# Patient Record
Sex: Male | Born: 1970
Health system: Southern US, Community
[De-identification: ages and names within clinical notes are randomized; demographics above are authoritative.]

## PROBLEM LIST (undated history)

## (undated) DIAGNOSIS — J849 Interstitial pulmonary disease, unspecified: Secondary | ICD-10-CM

## (undated) DIAGNOSIS — D6861 Antiphospholipid syndrome: Secondary | ICD-10-CM

## (undated) DIAGNOSIS — I10 Essential (primary) hypertension: Secondary | ICD-10-CM

## (undated) DIAGNOSIS — F32A Depression, unspecified: Secondary | ICD-10-CM

## (undated) DIAGNOSIS — M8430XA Stress fracture, unspecified site, initial encounter for fracture: Secondary | ICD-10-CM

## (undated) DIAGNOSIS — I82409 Acute embolism and thrombosis of unspecified deep veins of unspecified lower extremity: Secondary | ICD-10-CM

## (undated) DIAGNOSIS — K224 Dyskinesia of esophagus: Secondary | ICD-10-CM

## (undated) DIAGNOSIS — E785 Hyperlipidemia, unspecified: Secondary | ICD-10-CM

## (undated) DIAGNOSIS — K635 Polyp of colon: Secondary | ICD-10-CM

## (undated) DIAGNOSIS — I2699 Other pulmonary embolism without acute cor pulmonale: Secondary | ICD-10-CM

## (undated) DIAGNOSIS — IMO0002 Reserved for concepts with insufficient information to code with codable children: Secondary | ICD-10-CM

## (undated) DIAGNOSIS — J189 Pneumonia, unspecified organism: Secondary | ICD-10-CM

## (undated) DIAGNOSIS — M1990 Unspecified osteoarthritis, unspecified site: Secondary | ICD-10-CM

## (undated) DIAGNOSIS — F419 Anxiety disorder, unspecified: Secondary | ICD-10-CM

## (undated) DIAGNOSIS — I639 Cerebral infarction, unspecified: Secondary | ICD-10-CM

## (undated) DIAGNOSIS — F329 Major depressive disorder, single episode, unspecified: Secondary | ICD-10-CM

## (undated) DIAGNOSIS — E7800 Pure hypercholesterolemia, unspecified: Secondary | ICD-10-CM

## (undated) DIAGNOSIS — M329 Systemic lupus erythematosus, unspecified: Secondary | ICD-10-CM

## (undated) HISTORY — DX: Anxiety disorder, unspecified: F41.9

## (undated) HISTORY — DX: Polyp of colon: K63.5

## (undated) HISTORY — DX: Antiphospholipid syndrome: D68.61

## (undated) HISTORY — DX: Hyperlipidemia, unspecified: E78.5

## (undated) HISTORY — DX: Unspecified osteoarthritis, unspecified site: M19.90

## (undated) HISTORY — DX: Essential (primary) hypertension: I10

## (undated) HISTORY — DX: Dyskinesia of esophagus: K22.4

## (undated) HISTORY — DX: Stress fracture, unspecified site, initial encounter for fracture: M84.30XA

## (undated) HISTORY — PX: OTHER SURGICAL HISTORY: SHX169

## (undated) HISTORY — PX: CHOLECYSTECTOMY: SHX55

## (undated) HISTORY — DX: Cerebral infarction, unspecified: I63.9

---
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
## 2014-07-12 ENCOUNTER — Encounter (HOSPITAL_COMMUNITY): Payer: Self-pay | Admitting: Emergency Medicine

## 2014-07-12 ENCOUNTER — Emergency Department (HOSPITAL_COMMUNITY)
Admission: EM | Admit: 2014-07-12 | Discharge: 2014-07-13 | Disposition: A | Discharge status: Home / Self Care | Payer: Managed Care, Other (non HMO) | Attending: Emergency Medicine | Admitting: Emergency Medicine

## 2014-07-12 DIAGNOSIS — R079 Chest pain, unspecified: Secondary | ICD-10-CM | POA: Diagnosis present

## 2014-07-12 DIAGNOSIS — Z7982 Long term (current) use of aspirin: Secondary | ICD-10-CM | POA: Diagnosis not present

## 2014-07-12 DIAGNOSIS — Z760 Encounter for issue of repeat prescription: Secondary | ICD-10-CM

## 2014-07-12 DIAGNOSIS — Z79899 Other long term (current) drug therapy: Secondary | ICD-10-CM | POA: Diagnosis not present

## 2014-07-12 DIAGNOSIS — K224 Dyskinesia of esophagus: Secondary | ICD-10-CM

## 2014-07-12 DIAGNOSIS — Z87891 Personal history of nicotine dependence: Secondary | ICD-10-CM | POA: Insufficient documentation

## 2014-07-12 DIAGNOSIS — K219 Gastro-esophageal reflux disease without esophagitis: Secondary | ICD-10-CM

## 2014-07-12 LAB — CBC
HCT: 47.7 % (ref 39.0–52.0)
Hemoglobin: 16.8 g/dL (ref 13.0–17.0)
MCH: 31.5 pg (ref 26.0–34.0)
MCHC: 35.2 g/dL (ref 30.0–36.0)
MCV: 89.3 fL (ref 78.0–100.0)
Platelets: 286 10*3/uL (ref 150–400)
RBC: 5.34 MIL/uL (ref 4.22–5.81)
RDW: 12.7 % (ref 11.5–15.5)
WBC: 7.2 10*3/uL (ref 4.0–10.5)

## 2014-07-12 LAB — BASIC METABOLIC PANEL
Anion gap: 16 — ABNORMAL HIGH (ref 5–15)
BUN: 16 mg/dL (ref 6–23)
CO2: 24 mEq/L (ref 19–32)
Calcium: 9.7 mg/dL (ref 8.4–10.5)
Chloride: 97 mEq/L (ref 96–112)
Creatinine, Ser: 1.21 mg/dL (ref 0.50–1.35)
GFR calc Af Amer: 84 mL/min — ABNORMAL LOW (ref >90)
GFR calc non Af Amer: 72 mL/min — ABNORMAL LOW (ref >90)
Glucose, Bld: 140 mg/dL — ABNORMAL HIGH (ref 70–99)
Potassium: 4 mEq/L (ref 3.7–5.3)
Sodium: 137 mEq/L (ref 137–147)

## 2014-07-12 LAB — I-STAT TROPONIN, ED: Troponin i, poc: 0 ng/mL (ref 0.00–0.08)

## 2014-07-12 MED ORDER — CLONAZEPAM 0.5 MG PO TABS
0.5000 mg | ORAL_TABLET | Freq: Once | ORAL | Status: AC
  Administered 2014-07-13: 0.5 mg via ORAL
  Filled 2014-07-12: qty 1

## 2014-07-12 NOTE — ED Notes (Signed)
Pt states he has history of esophageal spasms.  Pt states that last nite he has been getting chest pain since and states that it mimics heart pain.

## 2014-07-12 NOTE — ED Provider Notes (Signed)
CSN: [id number]     Arrival date & time 07/12/14  1712 History   First MD Initiated Contact with Patient 07/12/14 2151     Chief Complaint  Patient presents with  . Chest Pain     (Consider location/radiation/quality/duration/timing/severity/associated sxs/prior Treatment) The history is provided by the patient. No language interpreter was used.  Wesley Hancock is a 43 y/o M with PMHx of esophageal spasms presenting to the ED with chest discomfort that is localized to the center of his chest that started yesterday. Patient reported that he has history of "acalasia" and stated that he has been on Cardizem for it in the past. Stated that he has not taken his Cardizem in at least 2.5 weeks. Reported that he started to have increase in pressure and gnawing pain, burning pain that is constant. Reported that the pain does not change with activity level or exertion. Patient reported that when he gets increased acid reflux discomfort this is when he is having a flare up of his esophageal spasms. Patient reported that he recently moved from [place] 3 months ago and stated that he has not gotten his insurance until today. Patient reported that he went to [facility], but reported that they would not prescribe him the medication secondary to issues with having chest pain and needed to be ruled out. Patient reported that back in [place] he had work-up for cardiac rule out - stated that finally GI became involved and diagnosed the patient. Denied leg swelling, vomiting, diarrhea, fever, chills, neck pain, neck stiffness, jaw pain, diaphoresis, weakness, numbness, tingling.  PCP none   History reviewed. No pertinent past medical history. Past Surgical History  Procedure Laterality Date  . Esophageal spasms     No family history on file. History  Substance Use Topics  . Smoking status: Former Research scientist (life sciences)  . Smokeless tobacco: Not on file  . Alcohol Use: Yes     Comment: occ    Review of  Systems  Constitutional: Negative for fever and chills.  Respiratory: Positive for chest tightness. Negative for shortness of breath.   Cardiovascular: Negative for chest pain.  Gastrointestinal: Negative for nausea, vomiting, abdominal pain, constipation, blood in stool and anal bleeding.  Musculoskeletal: Negative for back pain, neck pain and neck stiffness.  Neurological: Negative for dizziness, weakness and numbness.      Allergies  Review of patient's allergies indicates no known allergies.  Home Medications   Prior to Admission medications   Medication Sig Start Date End Date Taking? Authorizing Provider  aspirin EC 81 MG tablet Take 81 mg by mouth daily.   Yes Historical Provider, MD  diltiazem (CARDIZEM) 30 MG tablet Take 30 mg by mouth daily.   Yes Historical Provider, MD  escitalopram (LEXAPRO) 5 MG tablet Take 5 mg by mouth daily.   Yes Historical Provider, MD  lisinopril-hydrochlorothiazide (PRINZIDE,ZESTORETIC) 10-12.5 MG per tablet Take 1 tablet by mouth daily.   Yes Historical Provider, MD  nitroGLYCERIN (NITROSTAT) 0.4 MG SL tablet Place 0.4 mg under the tongue every 5 (five) minutes as needed for chest pain.   Yes Historical Provider, MD  ranitidine (ZANTAC) 150 MG tablet Take 150 mg by mouth 2 (two) times daily.   Yes Historical Provider, MD  diltiazem (CARDIZEM) 30 MG tablet Take 1 tablet (30 mg total) by mouth 2 (two) times daily. 07/13/14   Harles Evetts, PA-C   BP 140/82  Pulse 82  Temp(Src) 98 F (36.7 C) (Oral)  Resp 15  SpO2 99% Physical  Exam  Nursing note and vitals reviewed. Constitutional: He is oriented to person, place, and time. He appears well-developed and well-nourished. No distress.  Patient found sitting comfortably in bed without any sign of distress  HENT:  Head: Normocephalic and atraumatic.  Mouth/Throat: Oropharynx is clear and moist. No oropharyngeal exudate.  Eyes: Conjunctivae and EOM are normal. Pupils are equal, round, and reactive  to light. Right eye exhibits no discharge. Left eye exhibits no discharge.  Neck: Normal range of motion. Neck supple. No tracheal deviation present.  Cardiovascular: Normal rate, regular rhythm and normal heart sounds.  Exam reveals no friction rub.   No murmur heard. Cap refill < 3 seconds Negative swelling or pitting edema noted to the lower extremities bilaterally   Pulmonary/Chest: Effort normal and breath sounds normal. No respiratory distress. He has no wheezes. He has no rales. He exhibits tenderness.  Patient is able to speak in full sentences without difficulty  Negative use of accessory muscles Negative stridor Negative damage noted to chest wall Mild discomfort upon palpation to the Center the chest as well as left-sided chest upon palpation-pain reproducible upon palpation  Abdominal: Soft. Bowel sounds are normal. There is no tenderness. There is no rebound and no guarding.  Musculoskeletal: Normal range of motion.  Full ROM to upper and lower extremities without difficulty noted, negative ataxia noted.  Lymphadenopathy:    He has no cervical adenopathy.  Neurological: He is alert and oriented to person, place, and time. No cranial nerve deficit. He exhibits normal muscle tone. Coordination normal.  Cranial nerves III-XII grossly intact Strength 5+/5+ to upper and lower extremities bilaterally with resistance applied, equal distribution noted Equal grip strength  Negative usual droop Negative slurred speech Negative aphasia Gait proper, proper balance - negative sway, negative drift, negative step-offs  Skin: Skin is warm and dry. No rash noted. He is not diaphoretic. No erythema.  Psychiatric: He has a normal mood and affect. His behavior is normal.    ED Course  Procedures (including critical care time)  12:12 AM Patient seen and assessed by attending physician who saw and assessed patient. As per physician, reported that patient can be discharged. Recommended  discharging patient with Cardizem.   Results for orders placed during the hospital encounter of 07/12/14  CBC      Result Value Ref Range   WBC 7.2  4.0 - 10.5 K/uL   RBC 5.34  4.22 - 5.81 MIL/uL   Hemoglobin 16.8  13.0 - 17.0 g/dL   HCT 47.7  39.0 - 52.0 %   MCV 89.3  78.0 - 100.0 fL   MCH 31.5  26.0 - 34.0 pg   MCHC 35.2  30.0 - 36.0 g/dL   RDW 12.7  11.5 - 15.5 %   Platelets 286  150 - 400 K/uL  BASIC METABOLIC PANEL      Result Value Ref Range   Sodium 137  137 - 147 mEq/L   Potassium 4.0  3.7 - 5.3 mEq/L   Chloride 97  96 - 112 mEq/L   CO2 24  19 - 32 mEq/L   Glucose, Bld 140 (*) 70 - 99 mg/dL   BUN 16  6 - 23 mg/dL   Creatinine, Ser 1.21  0.50 - 1.35 mg/dL   Calcium 9.7  8.4 - 10.5 mg/dL   GFR calc non Af Amer 72 (*) >90 mL/min   GFR calc Af Amer 84 (*) >90 mL/min   Anion gap 16 (*) 5 - 15  Randolm Idol, ED      Result Value Ref Range   Troponin i, poc 0.00  0.00 - 0.08 ng/mL   Comment 3             Labs Review Labs Reviewed  BASIC METABOLIC PANEL - Abnormal; Notable for the following:    Glucose, Bld 140 (*)    GFR calc non Af Amer 72 (*)    GFR calc Af Amer 84 (*)    Anion gap 16 (*)    All other components within normal limits  CBC  I-STAT TROPOININ, ED    Imaging Review No results found.   EKG Interpretation None      Date: 07/13/2014  Rate: 108  Rhythm: sinus tachycardia  QRS Axis: normal  Intervals: normal  ST/T Wave abnormalities: normal  Conduction Disutrbances:none  Narrative Interpretation:   Old EKG Reviewed: none available EKG analyzed and reviewed by this provider and attending physician.    MDM   Final diagnoses:  Esophageal spasm  Gastroesophageal reflux disease, esophagitis presence not specified  Medication refill    Medications  clonazePAM (KLONOPIN) tablet 0.5 mg (not administered)   Filed Vitals:   07/12/14 2200 07/12/14 2230 07/12/14 2300 07/12/14 2330  BP: 135/87 129/81 127/89 140/82  Pulse: 63 68 62 82   Temp:      TempSrc:      Resp: 24 25 25 15   SpO2: 97% 96% 95% 99%   Patient presenting to the ED requesting Cardizem prescription refill regarding his esophageal spasms. Patient recently moved from [place] and acquired insurance yesterday. Has been without Cardizem for approximately 2-1/2 weeks. EKG normal sinus tachycardia with a heart rate of 108 - normal EKG. I-STAT troponin negative elevation. CBC and BMP unremarkable. Pain reproducible upon exam. Patient stable, afebrile. Patient not septic appearing. Heart rate is improved while in ED setting from 112 beats per minute 82 beats per minute. Patient seen and assessed by attending physician who agreed to plan of discharge-recommended patient be discharged with Cardizem. Discharge patient with Cardizem. [facility]. Discussed with patient to closely monitor symptoms and if symptoms are to worsen or change to report back to the ED - strict return instructions given.  Patient agreed to plan of care, understood, all questions answered.   Jamse Mead, PA-C 07/13/14 [id number]

## 2014-07-13 MED ORDER — DILTIAZEM HCL 30 MG PO TABS: 30.0000 mg | ORAL_TABLET | Freq: Two times a day (BID) | ORAL | Status: DC

## 2014-07-13 NOTE — Discharge Instructions (Signed)
Please call and set-up an appointment with [facility] [place] Please get established with a Gastroenterologist Please take medications as prescribed Please continue to rest and stay hydrated  Please continue to monitor symptoms closely and if symptoms are to worsen or change (fever greater than 101, chills, sweating, nausea, vomiting, chest pain, shortness of breath, difficulty breathing, weakness, numbness, tingling, worsening or changes to pain pattern, left arm weakness, jaw pain) please report back to the ED immediately   Esophageal Spasm Esophageal spasm is an uncoordinated contraction of the muscles of the esophagus (the tube which carries food from your mouth to your stomach). Normally, the muscles of the esophagus alternate between contraction and relaxation starting from the top of the esophagus and working down to the bottom. This moves the food from the mouth to the stomach. In esophageal spasm, all the muscles contract at once. This causes pain and fails to move the food along. As a result, you may have trouble swallowing.  Women are more likely than men to have esophageal spasm. The cause of the spasms is not known. Sometimes eating hot or cold foods triggers the condition and this may be due to an overly sensitive esophagus. This is not an infectious disease and cannot be passed to others. SYMPTOMS  Symptoms of esophageal spasm may include: chest pain, burning or pain with swallowing, and difficulty swallowing.  DIAGNOSIS  Esophageal spasm can be diagnosed by a test called manometry (pressure studies of the esophagus). In this test, a special tube is inserted down the esophagus. The tube measures the muscle activity of the esophagus. Abnormal contractions mixed with normal movement helps confirm the diagnosis.  A person with a hypersensitive esophagus may be diagnosed by inflating a long balloon in the person's esophagus. If this causes the same symptoms, preventive methods may  work. PREVENTION  Avoid hot or cold foods if that seems to be a trigger. PROGNOSIS  This condition does not go away, nor is treatment entirely satisfactory. Patients need to be careful of what they eat. They need to continue on medication if a useful one is found. Fortunately, the condition does not get progressively worse as time passes. Esophageal spasm does not usually lead to more serious problems but sometimes the pain can be disabling. If a person becomes afraid to eat they may become malnourished and lose weight.  TREATMENT   A procedure in which instruments of increasing size are inserted through the esophagus to enlarge (dilate) it are used.  Medications that decrease acid-production of the stomach may be used such as proton-pump inhibitors or H2-blockers.  Medications of several types can be used to relax the muscles of the esophagus.  An individual with a hypersensitive esophagus sometimes improves with low doses of medications normally used for depression.  No treatment for esophageal spasm is effective for everyone. Often several approaches will be tried before one works. In many cases, the symptoms will improve, but will not go away completely.  For severe cases, relief is obtained two-thirds of the time by cutting the muscles along the entire length of the esophagus. This is a major surgical procedure.  Your symptoms are usually the best guide to how well the treatment for esophageal spasm works. SIDE EFFECTS OF TREATMENTS  Nitrates can cause headaches and low blood pressure.  Calcium channel blockers can cause:  Feeling sick to your stomach (nausea).  Constipation and other side effects.  Antidepressants can cause side effects that depend on the medication used. HOME CARE  INSTRUCTIONS   Let your caregiver know if problems are getting worse, or if you get food stuck in your esophagus for longer than 1 hour or as directed and are unable to swallow liquid.  Take  medications as directed and with permission of your caregiver. Ask about what to do if a medication seems to get stuck in your esophagus. Only take over-the-counter or prescription medicines for pain, discomfort, or fever as directed by your caregiver.  Soft and liquid foods pass more easily than solid pieces. SEEK IMMEDIATE MEDICAL CARE IF:   You develop severe chest pain, especially if the pain is crushing or pressure-like and spreads to the arms, back, neck, or jaw, or if you have sweating, nausea, or shortness of breath. THIS COULD BE AN EMERGENCY. Do not wait to see if the pain will go away. Get medical help at once. Call 911 or 0 (operator). DO NOT drive yourself to the hospital.  Your chest pain gets worse and does not go away with rest.  You have an attack of chest pain lasting longer than usual despite rest and treatment with the medications your physician has prescribed.  You wake from sleep with chest pain or shortness of breath.  You feel dizzy or faint.  You have chest pain, not typical of your usual pain, caused by your esophagus for which you originally saw your caregiver. MAKE SURE YOU:   Understand these instructions.  Will watch your condition.  Will get help right away if you are not doing well or get worse. Document Released: 02/06/2003 Document Revised: 02/08/2012 Document Reviewed: 02/09/2014 [facility] Patient Information 2015 [place], [place]. This information is not intended to replace advice given to you by your health care provider. Make sure you discuss any questions you have with your health care provider.   Emergency Department Resource Guide 1) Find a Doctor and Pay Out of Pocket Although you won't have to find out who is covered by your insurance plan, it is a good idea to ask around and get recommendations. You will then need to call the office and see if the doctor you have chosen will accept you as a new patient and what types of options they offer for  patients who are self-pay. Some doctors offer discounts or will set up payment plans for their patients who do not have insurance, but you will need to ask so you aren't surprised when you get to your appointment.  2) Contact Your Local Health Department Not all health departments have doctors that can see patients for sick visits, but many do, so it is worth a call to see if yours does. If you don't know where your local health department is, you can check in your phone book. The CDC also has a tool to help you locate your state's health department, and many state websites also have listings of all of their local health departments.  3) Find a [facility] If your illness is not likely to be very severe or complicated, you may want to try a walk in clinic. These are popping up all over the country in pharmacies, drugstores, and shopping centers. They're usually staffed by nurse practitioners or physician assistants that have been trained to treat common illnesses and complaints. They're usually fairly quick and inexpensive. However, if you have serious medical issues or chronic medical problems, these are probably not your best option.  No Primary Care Doctor: - Call Health Connect at  [phone] - they can help you locate  a primary care doctor that  accepts your insurance, provides certain services, etc. - Physician Referral Service- [phone]  Chronic Pain Problems: Organization         Address  Phone   Notes  [facility]  [phone] Patients need to be referred by their primary care doctor.   Medication Assistance: Organization         Address  Phone   Notes  [facility] Medication [facility] [address] [place], [place] [address] [phone] --Must be a resident of [facility] -- Must have NO insurance coverage whatsoever (no Medicaid/ Medicare, etc.) -- The pt. MUST have a primary care doctor that directs their care regularly  and follows them in the community   MedAssist  [phone]   [employer]  [phone]    Agencies that provide inexpensive medical care: Organization         Address  Phone   Notes  [place]  [phone]   [facility]    [phone]   [facility] [place], [place] [address] [phone]   [place] [address] [place] [phone]   Planned Parenthood    [phone]   [facility]    [phone]   [place] and [place] [address], [place] Phone:  [phone], Fax:  [phone] Hours of Operation:  9 am - 6 pm, M-F.  Also accepts Medicaid/Medicare and self-pay.  [facility] for Brielle [address], [address], [place] Phone: [phone], Fax: [phone]. Hours of Operation:  8:30 am - 5:30 pm, M-F.  Also accepts Medicaid and self-pay.  [facility] [address] [place] Phone: [phone]   [place], [place], [place] [phone], Ext. [phone] Mondays & Thursdays: 7-9 AM.  First 15 patients are seen on a first come, first serve basis.    [place] Providers:  Organization         Address  Phone   Notes  [facility] [address], [place] [phone] Also accepts self-pay patients.  [facility] [address], [place]  [phone]   [place], [address], [place] [phone]   [facility] [address] [place] [phone]   Lucianne Lei [address], [place]   [phone] Only accepts [place] Access [place] patients after they have their name applied to their card.   Self-Pay (no insurance) in [facility]:  Organization         Address  Phone   Notes  [place]ickle  Cell Patients, [facility] [place] [phone]   [facility] Urgent Care [place] [phone]   [facility] [place]  [place], [place], [place] [phone]   [facility]/Dr. Osei-Bonsu  [address] [place] or [address], [place] [phone] Phone number for both [place] and [place] locations is the same.  [facility] [address], [place] [phone]   [facility] [address] [place] or [address] [phone] (  [phone]   [place] [phone], phone; [phone], fax Sees patients 1st and 3rd Saturday of every month.  Must not qualify for public or private insurance (i.e. Medicaid, Medicare, [place] Health Choice, Veterans' Benefits)  Household income should be no more than 200% of the poverty level The clinic cannot treat you if you are pregnant or think you are pregnant  Sexually transmitted diseases are not treated at the clinic.    Dental Care: Organization         Address  Phone  Notes  [facility] Department of [facility] [phone] Accepts children up to age 29 who are enrolled in [place] or [place]; pregnant women with a Medicaid card; and children who have applied for Medicaid or [place] Health Choice, but were declined, whose parents can pay a reduced fee at time of service.  [facility] Department of [facility]  [address], [place] [phone] Accepts children up to age 57 who are enrolled in [place] or [place]; pregnant women with a Medicaid card; and children who have applied for Medicaid or [place] Health Choice, but were declined, whose parents can pay a reduced fee at time of service.  [facility]  Access PROGRAM  [place] [phone] Patients are seen by appointment only. Walk-ins are not accepted. [place] will see patients 5 years of age and older. Monday - Tuesday (8am-5pm) Most Wednesdays (8:30-5pm) $30 per visit, cash only  [facility] Adult Dental Access PROGRAM  [address], [facility] [phone] Patients are seen by appointment only. Walk-ins are not accepted. [place] will see patients 44 years of age and older. One Wednesday Evening (Monthly: Volunteer Based).  $30 per visit, cash only  [facility]  [phone] for adults; Children under age 8, call Graduate Pediatric Dentistry at [phone]. Children aged 69-14, please call [phone] to request a pediatric application.  Dental services are provided in all areas of dental care including fillings, crowns and bridges, complete and partial dentures, implants, gum treatment, root canals, and extractions. Preventive care is also provided. Treatment is provided to both adults and children. Patients are selected via a lottery and there is often a waiting list.   [facility] [address] [place]  [phone] [email or web address]   [facility] [address] [place], [place] [phone], Ext. [phone] Second and Fourth Thursday of each month, opens at 6:30 AM; Clinic ends at 9 AM.  Patients are seen on a first-come first-served basis, and a limited number are seen during each clinic.   [facility]  [address] Hillard Danker [place], [place] [phone]   Eligibility Requirements You must have lived in [place], [place], or [place] counties for at least the last three months.   You cannot be eligible for state or federal sponsored [employer], including [employer], [place], or [employer].   You generally cannot be eligible for healthcare insurance through your employer.    How to apply: Eligibility  screenings are held every Tuesday and Wednesday afternoon from 1:00 pm until 4:00 pm. You do not need an appointment for the interview!  [facility] [address], [place], [place]   [place]  [phone]   [place] Department  [phone]  [place] in the Community: Intensive Outpatient Programs Organization         Address  Phone  Notes  [place]elika [place]. [address] [place], [place] [phone]   [facility] Outpatient [address] [place], [place]   ADS: Alcohol & Drug Svcs [address] [place], [place]   [place] [address],  [place], [place] or [phone]   Substance Abuse Resources Organization         Address  Phone  Notes  Alcohol and Drug Services  [phone]   [place]  [phone]   The [place]   Chinita Pester  [phone]   Residential & Outpatient Substance Abuse Program  [phone]   Psychological Services Organization         Address  Phone  Notes  [facility] [place]  [place]  [phone]   [place] [address] [place] or [phone]    Mobile Crisis Teams Organization         Address  Phone  Notes  [facility], Mobile Crisis Care Unit  [phone]   [facility]ive Psychotherapeutic Services  [address] [place], [place]   [place] [address], [place] [phone]    Self-Help/Support Groups Organization         Address  Phone             Notes  [place]. of [place] - variety of support groups  [place] Call for more information  Narcotics Anonymous (NA), [facility] [address], [employer] [place]  2 meetings at  this location   Special educational needs teacher         Address  Phone  Notes  ASAP Residential Treatment [place],    [place]  [phone]   [facility]  [address] [place] [address], [place], [place]   [place], [place] [phone] Admissions: 8am-3pm M-F  Incentives Substance [place] [address]    [place], [place] [phone]   [facility] [address] [place], [place], [place]   [facility] [address]  [place], [place]   [facility]ight Programs - Intensive Outpatient [address], [place], [place]   [facility] [place].,  [place], [place] [phone] or [phone]   [facility] (RTS) [address], [place], [place] Accepts Medicaid  [place] [address]  [place] [phone] Substance Abuse/Addiction Treatment   [facility] Organization         Address  Phone  Notes  [facility]  [phone]   Domenic Schwab, PhD [address] Arlis Porta [place], [place]   [phone] or [phone]   [facility] [place], [place] [phone]   [place] [address] [place], [place] [phone] Insurance/Medicaid/sponsorship through [employer] and Families [address] [place]                                    [place], [place] [phone] Therapy/tele-psych/case  [facility] [place], [place] [phone]    Dr. Adele Schilder  [phone]   [facility] Dept. 1) [address], [place] 2) [place] 3)  [place] [address], [place] [phone]  [phone]   [place] [phone] or [phone] (After Hours)

## 2014-07-13 NOTE — ED Provider Notes (Signed)
Medical screening examination/treatment/procedure(s) were conducted as a shared visit with non-physician practitioner(s) and myself.  I personally evaluated the patient during the encounter. Pt presents w/ constant substernal CP since yesterday, similar to multiple prior episodes of esophageal spasm. He is out of his Cardizem. EKG w/o acute ischemic changes, trop negative. Will d/c home w/ Cardizem.   EKG Interpretation   Date/Time:  Thursday July 12 2014 17:18:59 EDT Ventricular Rate:  108 PR Interval:  144 QRS Duration: 86 QT Interval:  340 QTC Calculation: 455 R Axis:   18 Text Interpretation:  Sinus tachycardia Otherwise normal ECG No prior for  comparison. Confirmed by Tawnya Crook  MD, [place] [phone]) on 07/13/2014 8:24:36  PM        Ernestina Patches, MD 07/13/14 2027

## 2014-09-21 DIAGNOSIS — R911 Solitary pulmonary nodule: Secondary | ICD-10-CM | POA: Insufficient documentation

## 2014-09-21 DIAGNOSIS — K224 Dyskinesia of esophagus: Secondary | ICD-10-CM | POA: Insufficient documentation

## 2014-09-21 HISTORY — DX: Solitary pulmonary nodule: R91.1

## 2014-09-23 DIAGNOSIS — I10 Essential (primary) hypertension: Secondary | ICD-10-CM | POA: Insufficient documentation

## 2014-09-23 DIAGNOSIS — I119 Hypertensive heart disease without heart failure: Secondary | ICD-10-CM | POA: Insufficient documentation

## 2014-09-23 HISTORY — DX: Essential (primary) hypertension: I10

## 2015-02-23 ENCOUNTER — Emergency Department (HOSPITAL_COMMUNITY)
Admission: EM | Admit: 2015-02-23 | Discharge: 2015-02-23 | Disposition: A | Discharge status: Home / Self Care | Payer: Managed Care, Other (non HMO) | Attending: Emergency Medicine | Admitting: Emergency Medicine

## 2015-02-23 ENCOUNTER — Encounter (HOSPITAL_COMMUNITY): Payer: Self-pay | Admitting: Emergency Medicine

## 2015-02-23 ENCOUNTER — Emergency Department (HOSPITAL_COMMUNITY): Payer: Managed Care, Other (non HMO)

## 2015-02-23 DIAGNOSIS — Z79899 Other long term (current) drug therapy: Secondary | ICD-10-CM | POA: Diagnosis not present

## 2015-02-23 DIAGNOSIS — Z7982 Long term (current) use of aspirin: Secondary | ICD-10-CM | POA: Diagnosis not present

## 2015-02-23 DIAGNOSIS — R0789 Other chest pain: Secondary | ICD-10-CM | POA: Diagnosis not present

## 2015-02-23 DIAGNOSIS — R079 Chest pain, unspecified: Secondary | ICD-10-CM | POA: Diagnosis present

## 2015-02-23 DIAGNOSIS — Z87891 Personal history of nicotine dependence: Secondary | ICD-10-CM | POA: Insufficient documentation

## 2015-02-23 LAB — CBC
HCT: 46.3 % (ref 39.0–52.0)
Hemoglobin: 16.1 g/dL (ref 13.0–17.0)
MCH: 31.2 pg (ref 26.0–34.0)
MCHC: 34.8 g/dL (ref 30.0–36.0)
MCV: 89.7 fL (ref 78.0–100.0)
Platelets: 263 10*3/uL (ref 150–400)
RBC: 5.16 MIL/uL (ref 4.22–5.81)
RDW: 12.3 % (ref 11.5–15.5)
WBC: 6.8 10*3/uL (ref 4.0–10.5)

## 2015-02-23 LAB — BASIC METABOLIC PANEL
Anion gap: 11 (ref 5–15)
BUN: 16 mg/dL (ref 6–23)
CO2: 26 mmol/L (ref 19–32)
Calcium: 9.6 mg/dL (ref 8.4–10.5)
Chloride: 102 mmol/L (ref 96–112)
Creatinine, Ser: 1.09 mg/dL (ref 0.50–1.35)
GFR calc Af Amer: >90 mL/min (ref >90)
GFR calc non Af Amer: 82 mL/min — ABNORMAL LOW (ref >90)
Glucose, Bld: 101 mg/dL — ABNORMAL HIGH (ref 70–99)
Potassium: 3.9 mmol/L (ref 3.5–5.1)
Sodium: 139 mmol/L (ref 135–145)

## 2015-02-23 LAB — I-STAT TROPONIN, ED
Troponin i, poc: 0 ng/mL (ref 0.00–0.08)
Troponin i, poc: 0.01 ng/mL (ref 0.00–0.08)

## 2015-02-23 MED ORDER — SUCRALFATE 1 GM/10ML PO SUSP: 1.0000 g | Freq: Three times a day (TID) | ORAL | Status: DC

## 2015-02-23 NOTE — Discharge Instructions (Signed)
Follow-up with the GI Dr. provided.  Return here as needed

## 2015-02-23 NOTE — ED Notes (Signed)
Pt alert, oriented, and ambulatory  upon DC.  He was advised to follow up with PCP and GI.

## 2015-02-23 NOTE — ED Notes (Signed)
Patient here with complaints of chest pain that start last night. States that he know it is not cardiac related, its related to his esophageal spasms. States that he took 2 nitro tablets that did nothing for his pain. Denis n/v/d. AAO x4

## 2015-02-23 NOTE — ED Provider Notes (Signed)
CSN: [id number]     Arrival date & time 02/23/15  0741 History   First MD Initiated Contact with Patient 02/23/15 0801     Chief Complaint  Patient presents with  . Chest Pain     (Consider location/radiation/quality/duration/timing/severity/associated sxs/prior Treatment) HPI   The patient is a 44 y/o male with a history of achalasia, hypertension, former smoking history, and family history of heart disease and diabetes who presents to the emergency department with left sided chest pain. This pain started 2 days ago and became much worse last night. It is constant but worsens for several minutes at a time. He states that this pain feels much like his usual achalasia "flair ups" except that it is more severe and lower than normal. The pain is in his mid left chest down to his lower ribs and over to his midaxilla, and occasionally to his left shoulder. 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 He has had worsening dysphagia and nausea, no regurgitation, reflux, or vomiting. He took 2 nitrostat tabs last night without relief, and these usually relieve the spasms he feels with achalasia. He denies fevers, chills, cough, palpitations, weakness, numbness, tingling, edema, diaphoresis. He endorses shortness of breath when the pain is at its worst. He has hypertension and achalasia. His achalasia was diagnoses 2.5 years ago after extensive workup including several cardiac rule-outs, but he says he has had the symptoms for over a decade. He typically has a mild flair up at least weekly. He denies a personal history of diabetes. He has a family history of heart disease, his father had his first MI at age 71, CABG in his 5s, and is now in his 109s with severe CHF. He also has a family history of diabetes in his mother.    History reviewed. No pertinent past medical history. Past Surgical History  Procedure Laterality Date  . Esophageal spasms     History reviewed. No pertinent  family history. History  Substance Use Topics  . Smoking status: Former Research scientist (life sciences)  . Smokeless tobacco: Not on file  . Alcohol Use: Yes     Comment: occ    Review of Systems  All other systems negative except as documented in the HPI. All pertinent positives and negatives as reviewed in the HPI.   Allergies  Review of patient's allergies indicates no known allergies.  Home Medications   Prior to Admission medications   Medication Sig Start Date End Date Taking? Authorizing Provider  aspirin EC 81 MG tablet Take 81 mg by mouth daily.   Yes Historical Provider, MD  diltiazem (CARDIZEM) 30 MG tablet Take 1 tablet (30 mg total) by mouth 2 (two) times daily. 07/13/14  Yes Marissa Sciacca, PA-C  lisinopril-hydrochlorothiazide (PRINZIDE,ZESTORETIC) 10-12.5 MG per tablet Take 1 tablet by mouth daily.   Yes Historical Provider, MD  nitroGLYCERIN (NITROSTAT) 0.4 MG SL tablet Place 0.4 mg under the tongue every 5 (five) minutes as needed for chest pain.   Yes Historical Provider, MD  ranitidine (ZANTAC) 150 MG tablet Take 150 mg by mouth 2 (two) times daily.   Yes Historical Provider, MD   BP 126/91 mmHg  Pulse 79  Temp(Src) 98.4 F (36.9 C) (Oral)  Resp 12  SpO2 99% Physical Exam  Constitutional: He is oriented to person, place, and time. He appears well-developed and well-nourished. No distress.  HENT:  Head: Normocephalic and atraumatic.  Eyes: Conjunctivae and EOM are normal. Pupils are equal, round, and reactive to light.  No scleral icterus.  Neck: Normal range of motion. No tracheal deviation present. No thyromegaly present.  Cardiovascular: Normal rate, regular rhythm, normal heart sounds and intact distal pulses.  Exam reveals no gallop and no friction rub.   No murmur heard. Pulmonary/Chest: Effort normal and breath sounds normal. No respiratory distress. He exhibits no tenderness.  Abdominal: Soft. He exhibits no distension. There is no tenderness.  Musculoskeletal: Normal range  of motion.  Lymphadenopathy:    He has no cervical adenopathy.  Neurological: He is alert and oriented to person, place, and time. No cranial nerve deficit. He exhibits normal muscle tone.  Skin: Skin is warm and dry. No rash noted. He is not diaphoretic. No erythema.  Psychiatric: He has a normal mood and affect.  Nursing note and vitals reviewed.   ED Course  Procedures (including critical care time) Labs Review Labs Reviewed  BASIC METABOLIC PANEL - Abnormal; Notable for the following:    Glucose, Bld 101 (*)    GFR calc non Af Amer 82 (*)    All other components within normal limits  CBC  I-STAT TROPOININ, ED  I-STAT TROPOININ, ED    Imaging Review No results found.   EKG Interpretation   Date/Time:  Saturday February 23 2015 08:00:20 EDT Ventricular Rate:  74 PR Interval:  153 QRS Duration: 95 QT Interval:  386 QTC Calculation: 428 R Axis:   15 Text Interpretation:  Sinus rhythm Baseline wander in lead(s) V4 V5 V6 No  significant change was found Confirmed by Wyvonnia Dusky  MD, STEPHEN [phone]) on  02/23/2015 8:19:40 AM      Patient is stable at this time.  The patient will follow-up with GI.  I feel like this is related to his achalasia.  The patient is advised to return here as needed.  The patient has had 2 sets of negative troponins.  The patient's chest pain is atypical, based on the fact that it is fairly constant and worse with eating    Dalia Heading, PA-C 02/23/15 [place], MD 02/24/15 [phone]

## 2015-09-18 DIAGNOSIS — E782 Mixed hyperlipidemia: Secondary | ICD-10-CM

## 2015-09-18 DIAGNOSIS — E785 Hyperlipidemia, unspecified: Secondary | ICD-10-CM | POA: Insufficient documentation

## 2015-09-18 HISTORY — DX: Mixed hyperlipidemia: E78.2

## 2016-02-08 ENCOUNTER — Emergency Department (HOSPITAL_COMMUNITY): Payer: Managed Care, Other (non HMO)

## 2016-02-08 ENCOUNTER — Emergency Department (HOSPITAL_COMMUNITY)
Admission: EM | Admit: 2016-02-08 | Discharge: 2016-02-08 | Disposition: A | Discharge status: Home / Self Care | Payer: Managed Care, Other (non HMO) | Attending: Emergency Medicine | Admitting: Emergency Medicine

## 2016-02-08 ENCOUNTER — Encounter (HOSPITAL_COMMUNITY): Payer: Self-pay

## 2016-02-08 DIAGNOSIS — Z87891 Personal history of nicotine dependence: Secondary | ICD-10-CM | POA: Insufficient documentation

## 2016-02-08 DIAGNOSIS — Z7982 Long term (current) use of aspirin: Secondary | ICD-10-CM | POA: Diagnosis not present

## 2016-02-08 DIAGNOSIS — Z79899 Other long term (current) drug therapy: Secondary | ICD-10-CM | POA: Diagnosis not present

## 2016-02-08 DIAGNOSIS — F329 Major depressive disorder, single episode, unspecified: Secondary | ICD-10-CM | POA: Diagnosis not present

## 2016-02-08 DIAGNOSIS — E7800 Pure hypercholesterolemia, unspecified: Secondary | ICD-10-CM | POA: Diagnosis not present

## 2016-02-08 DIAGNOSIS — I10 Essential (primary) hypertension: Secondary | ICD-10-CM | POA: Diagnosis not present

## 2016-02-08 DIAGNOSIS — K224 Dyskinesia of esophagus: Secondary | ICD-10-CM | POA: Diagnosis not present

## 2016-02-08 DIAGNOSIS — R079 Chest pain, unspecified: Secondary | ICD-10-CM | POA: Diagnosis present

## 2016-02-08 HISTORY — DX: Pure hypercholesterolemia, unspecified: E78.00

## 2016-02-08 HISTORY — DX: Major depressive disorder, single episode, unspecified: F32.9

## 2016-02-08 HISTORY — DX: Depression, unspecified: F32.A

## 2016-02-08 LAB — BASIC METABOLIC PANEL
Anion gap: 8 (ref 5–15)
BUN: 19 mg/dL (ref 6–20)
CO2: 25 mmol/L (ref 22–32)
Calcium: 9.8 mg/dL (ref 8.9–10.3)
Chloride: 104 mmol/L (ref 101–111)
Creatinine, Ser: 1.05 mg/dL (ref 0.61–1.24)
GFR calc Af Amer: >60 mL/min (ref >60)
GFR calc non Af Amer: >60 mL/min (ref >60)
Glucose, Bld: 129 mg/dL — ABNORMAL HIGH (ref 65–99)
Potassium: 3.7 mmol/L (ref 3.5–5.1)
Sodium: 137 mmol/L (ref 135–145)

## 2016-02-08 LAB — CBC
HCT: 46.1 % (ref 39.0–52.0)
Hemoglobin: 15.6 g/dL (ref 13.0–17.0)
MCH: 31.3 pg (ref 26.0–34.0)
MCHC: 33.8 g/dL (ref 30.0–36.0)
MCV: 92.4 fL (ref 78.0–100.0)
Platelets: 266 10*3/uL (ref 150–400)
RBC: 4.99 MIL/uL (ref 4.22–5.81)
RDW: 12.4 % (ref 11.5–15.5)
WBC: 6.7 10*3/uL (ref 4.0–10.5)

## 2016-02-08 LAB — I-STAT TROPONIN, ED: Troponin i, poc: 0 ng/mL (ref 0.00–0.08)

## 2016-02-08 MED ORDER — GI COCKTAIL ~~LOC~~
30.0000 mL | Freq: Once | ORAL | Status: AC
  Administered 2016-02-08: 30 mL via ORAL
  Filled 2016-02-08: qty 30

## 2016-02-08 MED ORDER — DIAZEPAM 5 MG PO TABS
5.0000 mg | ORAL_TABLET | Freq: Once | ORAL | Status: AC
  Administered 2016-02-08: 5 mg via ORAL
  Filled 2016-02-08: qty 1

## 2016-02-08 MED ORDER — DIAZEPAM 5 MG PO TABS: 5.0000 mg | ORAL_TABLET | Freq: Four times a day (QID) | ORAL | Status: DC | PRN

## 2016-02-08 NOTE — ED Provider Notes (Signed)
CSN: [id number]     Arrival date & time 02/08/16  0027 History   First MD Initiated Contact with Patient 02/08/16 0450     Chief Complaint  Patient presents with  . Chest Pain     (Consider location/radiation/quality/duration/timing/severity/associated sxs/prior Treatment) Patient is a 45 y.o. male presenting with chest pain. The history is provided by the patient. No language interpreter was used.  Chest Pain Pain location:  L chest Pain quality: aching   Pain radiates to:  Does not radiate Pain radiates to the back: no   Pain severity:  Severe Onset quality:  Gradual Duration:  1 week Timing:  Constant Progression:  Worsening Chronicity:  New Relieved by:  Nothing Worsened by:  Nothing tried Ineffective treatments:  Antacids and nitroglycerin Risk factors: hypertension   Risk factors: no coronary artery disease   Pt has a history of esophageal spasm.  Pt is followed at [facility] reports he has had a normal cath within 3 years. Pt is out of valium.  Pt reports he takes nitroglycerin.  He has failed imdur and cardizem.   Past Medical History  Diagnosis Date  . HTN (hypertension)   . High cholesterol   . Depression    Past Surgical History  Procedure Laterality Date  . Esophageal spasms     History reviewed. No pertinent family history. Social History  Substance Use Topics  . Smoking status: Former Research scientist (life sciences)  . Smokeless tobacco: None  . Alcohol Use: Yes     Comment: occ    Review of Systems  Cardiovascular: Positive for chest pain.  All other systems reviewed and are negative.     Allergies  Review of patient's allergies indicates no known allergies.  Home Medications   Prior to Admission medications   Medication Sig Start Date End Date Taking? Authorizing Provider  acetaminophen (TYLENOL) 500 MG tablet Take 1,000 mg by mouth every 6 (six) hours as needed for mild pain.   Yes Historical Provider, MD  aspirin EC 81 MG tablet Take 81 mg by mouth daily.    Yes Historical Provider, MD  atorvastatin (LIPITOR) 10 MG tablet Take 10 mg by mouth daily.   Yes Historical Provider, MD  escitalopram (LEXAPRO) 5 MG tablet Take 5 mg by mouth daily.   Yes Historical Provider, MD  HYDROcodone-acetaminophen (NORCO/VICODIN) 5-325 MG tablet Take 1 tablet by mouth every 6 (six) hours as needed for moderate pain.   Yes Historical Provider, MD  lisinopril-hydrochlorothiazide (PRINZIDE,ZESTORETIC) 10-12.5 MG per tablet Take 1 tablet by mouth daily.   Yes Historical Provider, MD  nitroGLYCERIN (NITROSTAT) 0.4 MG SL tablet Place 0.4 mg under the tongue every 5 (five) minutes as needed for chest pain.   Yes Historical Provider, MD  omeprazole (PRILOSEC) 20 MG capsule Take 20 mg by mouth 2 (two) times daily before a meal.   Yes Historical Provider, MD  diltiazem (CARDIZEM) 30 MG tablet Take 1 tablet (30 mg total) by mouth 2 (two) times daily. Patient not taking: Reported on 02/08/2016 07/13/14   Marissa Sciacca, PA-C  sucralfate (CARAFATE) 1 GM/10ML suspension Take 10 mLs (1 g total) by mouth 4 (four) times daily -  with meals and at bedtime. Patient not taking: Reported on 02/08/2016 02/23/15   Dalia Heading, PA-C   BP 135/98 mmHg  Pulse 93  Temp(Src) 98.4 F (36.9 C) (Oral)  Resp 20  SpO2 98% Physical Exam  Constitutional: He is oriented to person, place, and time. He appears well-developed and well-nourished.  HENT:  Head: Normocephalic.  Eyes: EOM are normal.  Neck: Normal range of motion.  Pulmonary/Chest: Effort normal.  Abdominal: He exhibits no distension.  Musculoskeletal: Normal range of motion.  Neurological: He is alert and oriented to person, place, and time.  Psychiatric: He has a normal mood and affect.  Nursing note and vitals reviewed.   ED Course  Procedures (including critical care time) Labs Review Labs Reviewed  BASIC METABOLIC PANEL - Abnormal; Notable for the following:    Glucose, Bld 129 (*)    All other components within normal  limits  CBC  I-STAT TROPOININ, ED    Imaging Review Dg Chest 2 View  02/08/2016  CLINICAL DATA:  Chest pain to the left arm. EXAM: CHEST  2 VIEW COMPARISON:  02/23/2015 FINDINGS: Normal heart size and mediastinal contours. No acute infiltrate or edema. No effusion or pneumothorax. No acute osseous findings. IMPRESSION: Negative chest. Electronically Signed   By: Monte Fantasia M.D.   On: 02/08/2016 02:13   I have personally reviewed and evaluated these images and lab results as part of my medical decision-making.   EKG Interpretation None      MDM EKg nonacute negative troponin.   I do not think pain is cardiac.  Pt reports increased stress.  Pt reports death of his daughter 8 days ago.   Final diagnoses:  Esophageal spasm    Meds ordered this encounter  Medications  . omeprazole (PRILOSEC) 20 MG capsule    Sig: Take 20 mg by mouth 2 (two) times daily before a meal.  . escitalopram (LEXAPRO) 5 MG tablet    Sig: Take 5 mg by mouth daily.  Marland Kitchen atorvastatin (LIPITOR) 10 MG tablet    Sig: Take 10 mg by mouth daily.  Marland Kitchen HYDROcodone-acetaminophen (NORCO/VICODIN) 5-325 MG tablet    Sig: Take 1 tablet by mouth every 6 (six) hours as needed for moderate pain.  Marland Kitchen acetaminophen (TYLENOL) 500 MG tablet    Sig: Take 1,000 mg by mouth every 6 (six) hours as needed for mild pain.  . diazepam (VALIUM) tablet 5 mg    Sig:   . gi cocktail (Maalox,Lidocaine,Donnatal)    Sig:   An After Visit Summary was printed and given to the patient.   Hollace Kinnier [place], PA-C 02/08/16 [id number]  April Palumbo, MD 02/08/16 [phone]

## 2016-02-08 NOTE — ED Notes (Signed)
PA at bedside.

## 2016-02-08 NOTE — Discharge Instructions (Signed)
Esophageal Spasm  An esophageal spasm is a muscle spasm of the tube that connects the back of your throat to your stomach (esophagus). Your esophagus normally moves food and liquids down into your stomach with smooth, wavelike muscle contractions. If you have esophageal spasms, abnormal muscle contractions in your esophagus can be painful and cause you to have difficulty swallowing (dysphagia).  There are two types of esophageal spasms. You may have one or both types:   Diffuse esophageal spasms. These are irregular, uncoordinated muscle movements of the esophagus. The diffuse type causes more dysphagia.   Nutcracker esophagus. This is a type of muscle contraction that is coordinated but too strong. The muscles move in a regular order, but the contraction is stronger than necessary. The nutcracker type of esophageal spasm is more painful.  Severe cases of esophageal spasm can make it hard to eat well and do all your usual activities. Esophageal spasms often occur along with severe heartburn (reflux esophagitis). Swallowed foods or liquids may also come back up into your throat (regurgitation).   CAUSES   The cause of esophageal spasm is not known.  RISK FACTORS  You may have a higher risk for esophageal spasm if you:   Are male.   Are an older person.   Eat very quickly.   Swallow foods or drinks that are very hot or very cold.   Are depressed or anxious.  SIGNS AND SYMPTOMS   Signs and symptoms can vary from day to day. They may be mild or severe. They may last for minutes or hours. Common signs and symptoms include:   Chest pain. This may feel like a heart attack.   Back pain.   Dysphagia.   Heartburn.   The feeling that something is stuck in your throat (globus).   Regurgitation of foods or liquids.  DIAGNOSIS   Your health care provider may suspect esophageal spasm based on your symptoms and a physical exam. Tests may be done to help confirm the diagnosis. These may include:   Endoscopy. A  flexible telescope is passed into your esophagus.   Barium swallow. An X-ray is done while you drink a substance (contrast material) that shows up on X-ray.   Esophageal manometry. Pressure measurements of the inside of your esophagus are taken while you swallow.  TREATMENT   Mild cases of esophageal spasms may not need to be treated. You may be able to manage the spasms by avoiding the foods and eating habits that trigger them. Treatment for spasms that are more severe or frequent can include the following:   You may be given medicine to:   Relax the esophageal muscles.   Relieve muscle spasms (calcium channel blockers and nitrates).   Block nerve endings in the esophagus. This is done with an injection of a certain toxin (botulinum).   Relieve heartburn (proton pump inhibitors).   Antidepressant medicines are sometimes used to ease symptoms.   For severe cases, surgery is sometimes done to reduce esophageal muscle contractions (myotomy).  HOME CARE INSTRUCTIONS    Take medicines only as directed by your health care provider.   Do not eat foods that trigger heartburn or esophageal spasms.   Eat your meals slowly.   Chew your food completely.   Avoid foods and drinks that are very hot or very cold.   Find ways to manage stress.   Ask for help if you struggle with depression or anxiety.   Keep all follow-up visits as directed by your health   care provider. This is important.  SEEK MEDICAL CARE IF:    Your symptoms change or get worse.   You are losing weight because of dysphagia.   Your medicine is not helping your symptoms.   Your esophageal spasms are interfering with your quality of life.  SEEK IMMEDIATE MEDICAL CARE IF:    You have very bad or unusual chest pain.   You have trouble breathing.   You choke.  MAKE SURE YOU:   Understand these instructions.   Will watch your condition.   Will get help right away if you are not doing well or get worse.     This information is not intended to  replace advice given to you by your health care provider. Make sure you discuss any questions you have with your health care provider.     Document Released: 02/06/2003 Document Revised: 12/07/2014 Document Reviewed: 02/09/2014  Elsevier Interactive Patient Education 2016 [employer]

## 2016-02-08 NOTE — ED Notes (Signed)
Pt reports left sided chest pain that radiates to left arm and left shoulder experienced since last Thursday. Pt reports this is recurrent chest pain r/t esophogeal spasms. Pt states he believes this chest could also be r/t to anxiety from the recent loss of his child. Pt A+OX4, speaking in complete sentences, not diaphoretic, ambulatory to triage.

## 2016-08-06 DIAGNOSIS — F411 Generalized anxiety disorder: Secondary | ICD-10-CM

## 2016-08-06 HISTORY — DX: Generalized anxiety disorder: F41.1

## 2017-03-06 IMAGING — CR DG CHEST 2V
2 series · 2 of 2 positions shown · non-contrast
Comparison: None.

CLINICAL DATA: Acute onset of left anterior chest pain. Initial
encounter.

EXAM:
CHEST  2 VIEW

[w chest pa]
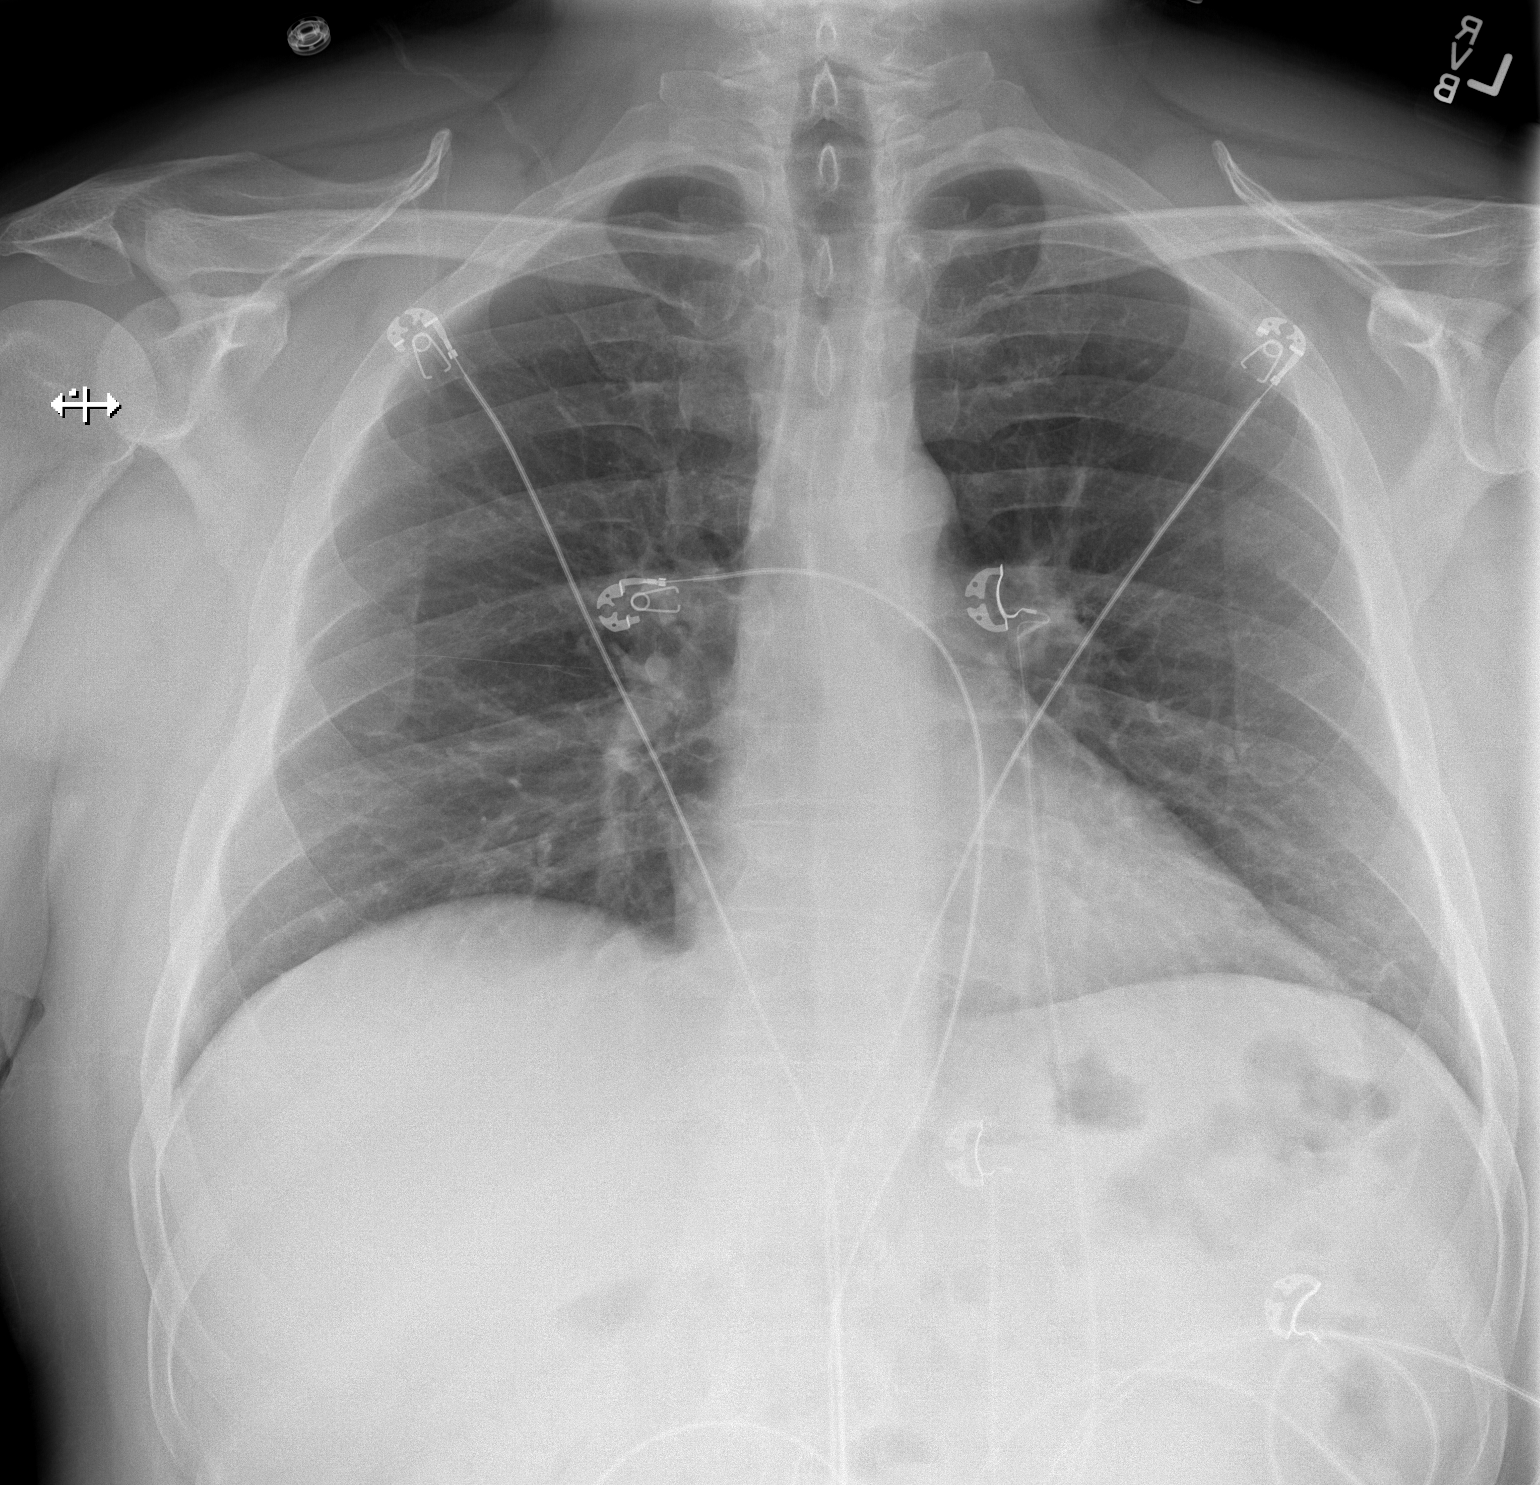

[w chest lat]
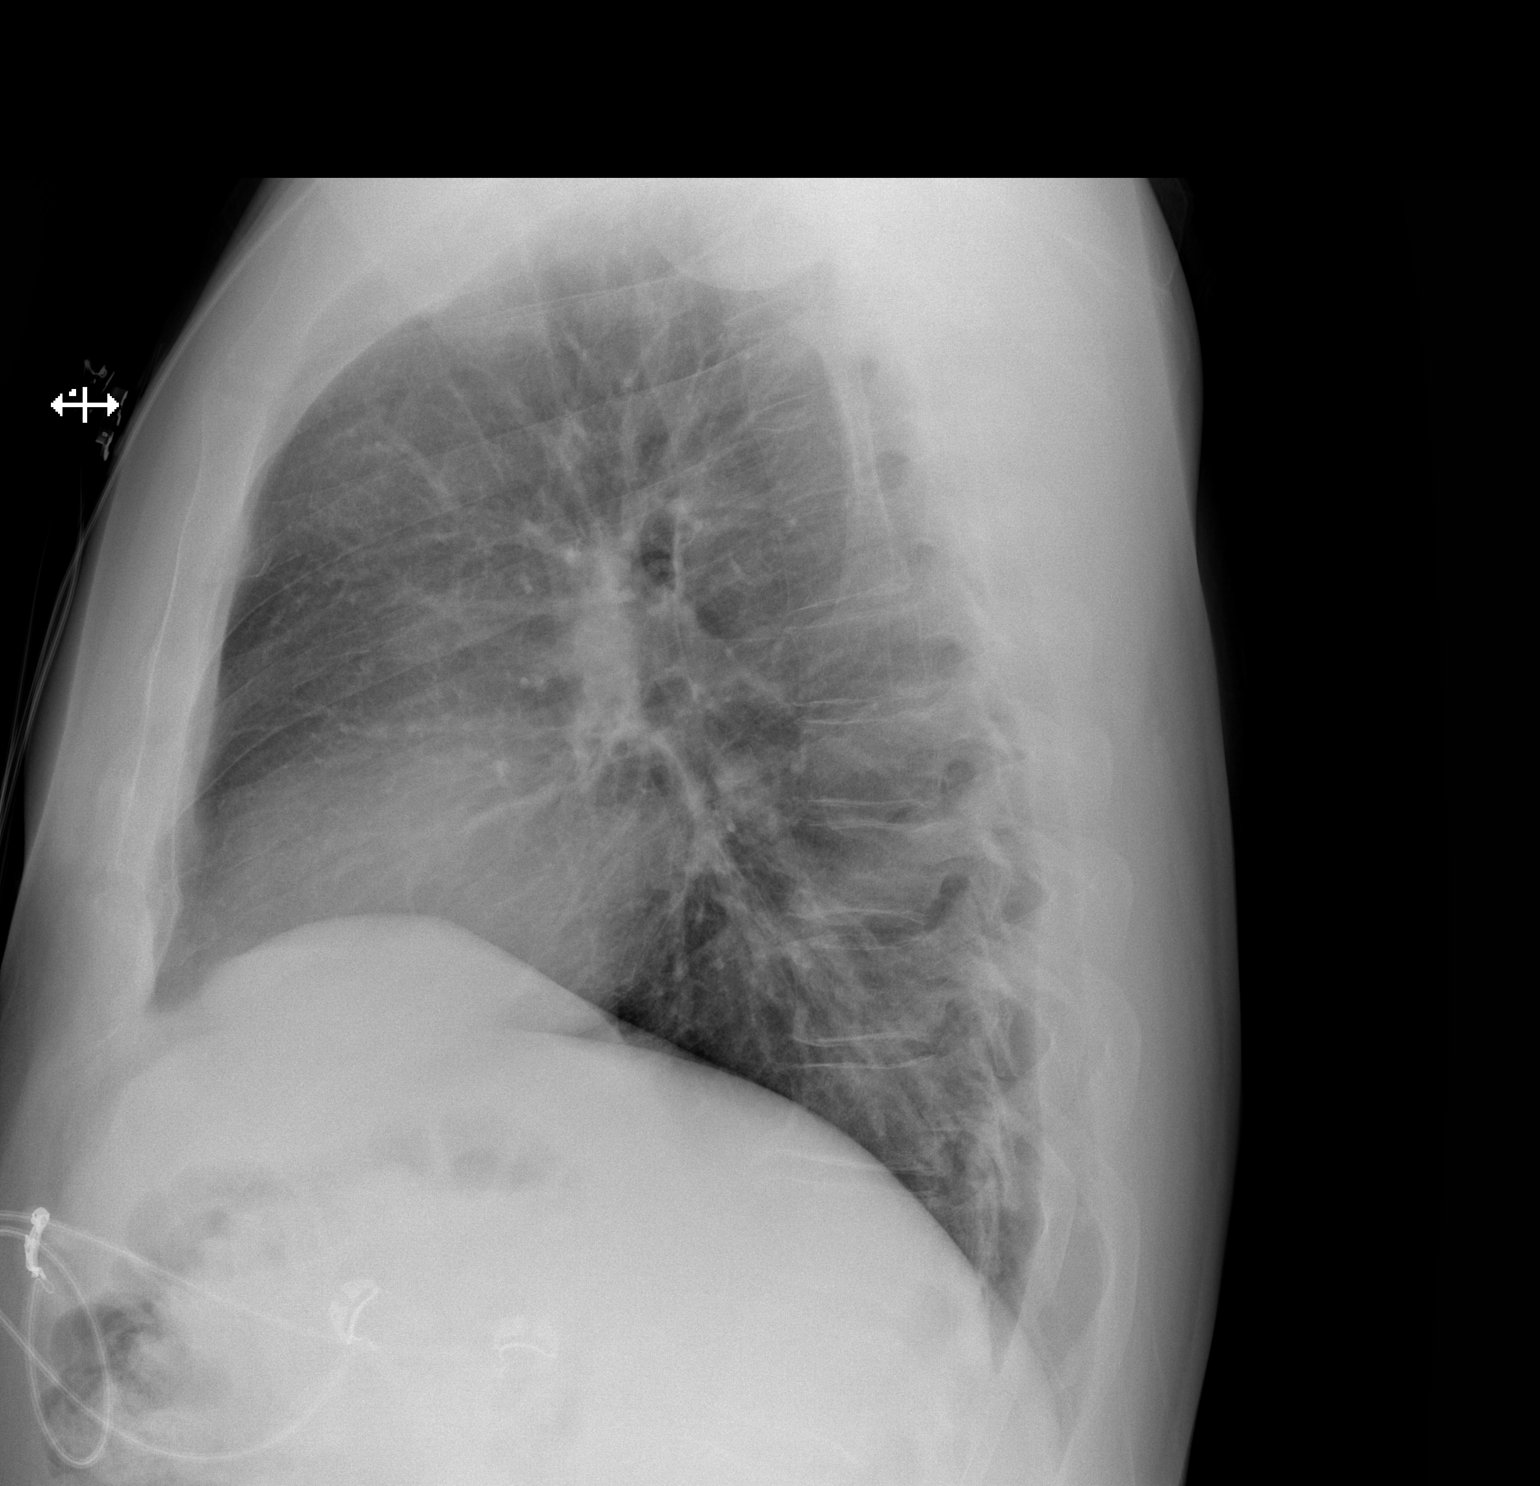

[2 of 2 positions shown; findings below may reference images not displayed]

FINDINGS: The lungs are well-aerated. Minimal left basilar atelectasis or
scarring is noted. There is no evidence of pleural effusion or
pneumothorax.

The heart is normal in size; the mediastinal contour is within
normal limits. No acute osseous abnormalities are seen.
IMPRESSION: Minimal left basilar atelectasis or scarring noted; lungs otherwise
clear.

## 2017-06-21 ENCOUNTER — Encounter (HOSPITAL_COMMUNITY): Payer: Self-pay | Admitting: Emergency Medicine

## 2017-06-21 ENCOUNTER — Emergency Department (HOSPITAL_COMMUNITY)
Admission: EM | Admit: 2017-06-21 | Discharge: 2017-06-21 | Disposition: A | Discharge status: Home / Self Care | Payer: Managed Care, Other (non HMO) | Attending: Emergency Medicine | Admitting: Emergency Medicine

## 2017-06-21 DIAGNOSIS — R1010 Upper abdominal pain, unspecified: Secondary | ICD-10-CM | POA: Diagnosis not present

## 2017-06-21 DIAGNOSIS — Z79899 Other long term (current) drug therapy: Secondary | ICD-10-CM | POA: Insufficient documentation

## 2017-06-21 DIAGNOSIS — Z87891 Personal history of nicotine dependence: Secondary | ICD-10-CM | POA: Insufficient documentation

## 2017-06-21 DIAGNOSIS — I10 Essential (primary) hypertension: Secondary | ICD-10-CM | POA: Diagnosis not present

## 2017-06-21 LAB — COMPREHENSIVE METABOLIC PANEL
ALT: 47 U/L (ref 17–63)
AST: 31 U/L (ref 15–41)
Albumin: 4.2 g/dL (ref 3.5–5.0)
Alkaline Phosphatase: 52 U/L (ref 38–126)
Anion gap: 7 (ref 5–15)
BUN: 17 mg/dL (ref 6–20)
CO2: 29 mmol/L (ref 22–32)
Calcium: 9.5 mg/dL (ref 8.9–10.3)
Chloride: 103 mmol/L (ref 101–111)
Creatinine, Ser: 1.26 mg/dL — ABNORMAL HIGH (ref 0.61–1.24)
GFR calc Af Amer: >60 mL/min (ref >60)
GFR calc non Af Amer: >60 mL/min (ref >60)
Glucose, Bld: 140 mg/dL — ABNORMAL HIGH (ref 65–99)
Potassium: 4 mmol/L (ref 3.5–5.1)
Sodium: 139 mmol/L (ref 135–145)
Total Bilirubin: 0.8 mg/dL (ref 0.3–1.2)
Total Protein: 8.3 g/dL — ABNORMAL HIGH (ref 6.5–8.1)

## 2017-06-21 LAB — CBC
HCT: 46.1 % (ref 39.0–52.0)
Hemoglobin: 16.4 g/dL (ref 13.0–17.0)
MCH: 32 pg (ref 26.0–34.0)
MCHC: 35.6 g/dL (ref 30.0–36.0)
MCV: 89.9 fL (ref 78.0–100.0)
Platelets: 276 10*3/uL (ref 150–400)
RBC: 5.13 MIL/uL (ref 4.22–5.81)
RDW: 12.5 % (ref 11.5–15.5)
WBC: 7.4 10*3/uL (ref 4.0–10.5)

## 2017-06-21 LAB — URINALYSIS, ROUTINE W REFLEX MICROSCOPIC
Bilirubin Urine: NEGATIVE
Glucose, UA: NEGATIVE mg/dL
Hgb urine dipstick: NEGATIVE
Ketones, ur: NEGATIVE mg/dL
Leukocytes, UA: NEGATIVE
Nitrite: NEGATIVE
Protein, ur: NEGATIVE mg/dL
Specific Gravity, Urine: 1.023 (ref 1.005–1.030)
pH: 5 (ref 5.0–8.0)

## 2017-06-21 LAB — LIPASE, BLOOD: Lipase: 45 U/L (ref 11–51)

## 2017-06-21 NOTE — ED Triage Notes (Signed)
Patient is complaining of severe abdominal pain in the upper left and right. Patient states he woke up with the pain at 4 am today. Patient states the pain is coming in waves.

## 2017-06-21 NOTE — ED Provider Notes (Signed)
[facility] DEPT Provider Note   CSN: [id number] Arrival date & time: 06/21/17  8341     History   Chief Complaint Chief Complaint  Patient presents with  . Abdominal Pain    HPI Wesley Hancock is a 46 y.o. male with history of HTN, high cholesterol, and nutcracker esophagus who presents today with complaint of acute onset, resolved upper abdominal pain. Patient states he awoke at 4 AM this morning with severe sharp, crampy upper abdominal pain which did not radiate. Pain was constant, and patient was unable to find a comfortable position to light. Denies nausea, vomiting, fevers, chills, chest pain, shortness of breath, dysuria, hematuria, melena, hematochezia. Has not tried anything for his symptoms. No aggravating or alleviating factors noted. He states that when he arrived to the ED, he was constantly changing positions and stated that were when he stood up his pain suddenly resolved, and then was followed by a large amount of belching and "heartburn sensation". He states the symptoms felt differently than his usual esophageal spasm pain. He denies any known sick contacts. States that yesterday he ate tomato pie, jalapeno poppers, peach lemonade and white rum mashed potatoes, and meat loaf. He does take omeprazole daily BID.    The history is provided by the patient.    Past Medical History:  Diagnosis Date  . Depression   . High cholesterol   . HTN (hypertension)     There are no active problems to display for this patient.   Past Surgical History:  Procedure Laterality Date  . esophageal spasms         Home Medications    Prior to Admission medications   Medication Sig Start Date End Date Taking? Authorizing Provider  acetaminophen (TYLENOL) 500 MG tablet Take 1,000 mg by mouth every 6 (six) hours as needed for mild pain.    [provider]  aspirin EC 81 MG tablet Take 81 mg by mouth daily.    [provider]  atorvastatin (LIPITOR) 10 MG  tablet Take 10 mg by mouth daily.    [provider]  diazepam (VALIUM) 5 MG tablet Take 1 tablet (5 mg total) by mouth every 6 (six) hours as needed (spasm). 02/08/16   Fransico Meadow, PA-C  diltiazem (CARDIZEM) 30 MG tablet Take 1 tablet (30 mg total) by mouth 2 (two) times daily. Patient not taking: Reported on 02/08/2016 07/13/14   Sciacca, Marissa, PA-C  escitalopram (LEXAPRO) 5 MG tablet Take 5 mg by mouth daily.    [provider]  HYDROcodone-acetaminophen (NORCO/VICODIN) 5-325 MG tablet Take 1 tablet by mouth every 6 (six) hours as needed for moderate pain.    [provider]  lisinopril-hydrochlorothiazide (PRINZIDE,ZESTORETIC) 10-12.5 MG per tablet Take 1 tablet by mouth daily.    [provider]  nitroGLYCERIN (NITROSTAT) 0.4 MG SL tablet Place 0.4 mg under the tongue every 5 (five) minutes as needed for chest pain.    [provider]  omeprazole (PRILOSEC) 20 MG capsule Take 20 mg by mouth 2 (two) times daily before a meal.    [provider]  sucralfate (CARAFATE) 1 GM/10ML suspension Take 10 mLs (1 g total) by mouth 4 (four) times daily -  with meals and at bedtime. Patient not taking: Reported on 02/08/2016 02/23/15   Dalia Heading, PA-C    Family History History reviewed. No pertinent family history.  Social History Social History  Substance Use Topics  . Smoking status: Former Research scientist (life sciences)  . Smokeless tobacco: Never  Used  . Alcohol use Yes     Comment: occ     Allergies   Patient has no known allergies.   Review of Systems Review of Systems  Constitutional: Negative for chills and fever.  Respiratory: Negative for shortness of breath.   Cardiovascular: Negative for chest pain.  Gastrointestinal: Positive for abdominal pain. Negative for blood in stool, constipation, diarrhea, nausea and vomiting.  Genitourinary: Negative for dysuria, flank pain and hematuria.  Musculoskeletal: Negative for back pain and neck  pain.  All other systems reviewed and are negative.    Physical Exam Updated Vital Signs BP (!) 151/103 (BP Location: Right Arm)   Pulse 92   Temp 98 F (36.7 C) (Oral)   Resp 15   Ht 6\' 1"  (1.854 m)   Wt 113.4 kg (250 lb)   SpO2 98%   BMI 32.98 kg/m   Physical Exam  Constitutional: He appears well-developed and well-nourished. No distress.  HENT:  Head: Normocephalic and atraumatic.  Eyes: Conjunctivae are normal. Right eye exhibits no discharge. Left eye exhibits no discharge.  Neck: Normal range of motion. No JVD present. No tracheal deviation present.  Cardiovascular: Normal rate, regular rhythm and normal heart sounds.   Pulmonary/Chest: Effort normal and breath sounds normal.  Abdominal: Soft. Bowel sounds are normal. He exhibits no distension and no mass. There is no tenderness. There is no rebound and no guarding. No hernia.  Murphy's sign absent, Rovsing's absent, no TTP at McBurney's point. No CVA tenderness  Musculoskeletal: He exhibits no edema.  Neurological: He is alert.  Skin: Skin is warm and dry. No erythema.  Psychiatric: He has a normal mood and affect. His behavior is normal.  Nursing note and vitals reviewed.    ED Treatments / Results  Labs (all labs ordered are listed, but only abnormal results are displayed) Labs Reviewed  COMPREHENSIVE METABOLIC PANEL - Abnormal; Notable for the following:       Result Value   Glucose, Bld 140 (*)    Creatinine, Ser 1.26 (*)    Total Protein 8.3 (*)    All other components within normal limits  LIPASE, BLOOD  CBC  URINALYSIS, ROUTINE W REFLEX MICROSCOPIC    EKG  EKG Interpretation None       Radiology No results found.  Procedures Procedures (including critical care time)  Medications Ordered in ED Medications - No data to display   Initial Impression / Assessment and Plan / ED Course  I have reviewed the triage vital signs and the nursing notes.  Pertinent labs & imaging results that  were available during my care of the patient were reviewed by me and considered in my medical decision making (see chart for details).     Patient with complaint of upper abdominal pain which has resolved. Afebrile, vital signs are stable (slightly elevated blood pressure, which I feel to be a pain response). Pain resolved prior to my examination. Abdominal exam unremarkable. Lab work without any acute abnormality. Slight elevation in creatinine of 1.26, which I feel is likely related to dehydration with no urinary symptoms and normal UA. Low suspicion of perforated viscus, cholecystitis, or surgical abdomen. I suspect his symptoms were likely due to consuming a large amount of food and drink which aggravates GERD symptoms. He is stable for discharge home with follow-up with primary care for reevaluation discussed indications for return to the ED. Pt verbalized understanding of and agreement with plan and is safe for discharge home at this time.  Final Clinical Impressions(s) / ED Diagnoses   Final diagnoses:  Pain of upper abdomen    New Prescriptions New Prescriptions   No medications on file     Debroah Baller 06/21/17 7858    Mesner, Corene Cornea, MD 06/22/17 1600

## 2017-06-21 NOTE — ED Notes (Signed)
No respiratory or acute distress noted alert and oriented x 3 call light in reach family at bedside no pain voiced. 

## 2017-06-21 NOTE — ED Notes (Signed)
Patient was alert, oriented and stable upon discharge. RN went over AVS and patient had no further questions.  

## 2017-06-21 NOTE — ED Notes (Signed)
Pt is unable to urinate at this time. 

## 2017-06-21 NOTE — Discharge Instructions (Signed)
Continue taking your GERD medications as prescribed. Avoid foods that will aggravate your reflux. Follow-up with your primary care physician for reevaluation. Return to the ED immediately if any concerning signs or symptoms develop.

## 2017-06-24 ENCOUNTER — Encounter: Payer: Self-pay | Admitting: Physician Assistant

## 2017-06-24 ENCOUNTER — Ambulatory Visit (INDEPENDENT_AMBULATORY_CARE_PROVIDER_SITE_OTHER): Payer: Managed Care, Other (non HMO) | Admitting: Physician Assistant

## 2017-06-24 VITALS — BP 130/98 | HR 72 | Temp 98.5°F | Ht 74.0 in | Wt 249.0 lb

## 2017-06-24 DIAGNOSIS — D229 Melanocytic nevi, unspecified: Secondary | ICD-10-CM | POA: Diagnosis not present

## 2017-06-24 DIAGNOSIS — F32A Depression, unspecified: Secondary | ICD-10-CM

## 2017-06-24 DIAGNOSIS — E7800 Pure hypercholesterolemia, unspecified: Secondary | ICD-10-CM

## 2017-06-24 DIAGNOSIS — F411 Generalized anxiety disorder: Secondary | ICD-10-CM | POA: Diagnosis not present

## 2017-06-24 DIAGNOSIS — K224 Dyskinesia of esophagus: Secondary | ICD-10-CM | POA: Diagnosis not present

## 2017-06-24 DIAGNOSIS — F329 Major depressive disorder, single episode, unspecified: Secondary | ICD-10-CM | POA: Diagnosis not present

## 2017-06-24 DIAGNOSIS — I10 Essential (primary) hypertension: Secondary | ICD-10-CM

## 2017-06-24 DIAGNOSIS — R1084 Generalized abdominal pain: Secondary | ICD-10-CM

## 2017-06-24 LAB — BASIC METABOLIC PANEL
BUN: 20 mg/dL (ref 6–23)
CO2: 30 mEq/L (ref 19–32)
Calcium: 10.3 mg/dL (ref 8.4–10.5)
Chloride: 100 mEq/L (ref 96–112)
Creatinine, Ser: 1.18 mg/dL (ref 0.40–1.50)
GFR: 70.88 mL/min (ref >60.00)
Glucose, Bld: 92 mg/dL (ref 70–99)
Potassium: 4.4 mEq/L (ref 3.5–5.1)
Sodium: 138 mEq/L (ref 135–145)

## 2017-06-24 LAB — LIPID PANEL
Cholesterol: 186 mg/dL (ref 0–200)
HDL: 42.4 mg/dL (ref >39.00)
LDL Cholesterol: 119 mg/dL — ABNORMAL HIGH (ref 0–99)
NonHDL: 143.29
Total CHOL/HDL Ratio: 4
Triglycerides: 119 mg/dL (ref 0.0–149.0)
VLDL: 23.8 mg/dL (ref 0.0–40.0)

## 2017-06-24 MED ORDER — ESCITALOPRAM OXALATE 5 MG PO TABS: 5.0000 mg | ORAL_TABLET | Freq: Every day | ORAL | 1 refills | Status: DC

## 2017-06-24 NOTE — Assessment & Plan Note (Signed)
PHQ-9 is 1 today. Continue Lexapro 5 mg. No SI/HI.

## 2017-06-24 NOTE — Assessment & Plan Note (Signed)
Repeat lipid panel. Will adjust simvastatin 10 mg prn. Work on diet and exercise.

## 2017-06-24 NOTE — Assessment & Plan Note (Signed)
GAD-7 is 2 today. Continue Lexapro 5 mg. No SI/HI.

## 2017-06-24 NOTE — Progress Notes (Signed)
Wesley Hancock is a 46 y.o. male here to Establish Care needs refill on Lexapro for Depression and Anxiety.  I acted as a Education administrator for [employer], PA-C Anselmo Pickler, LPN  History of Present Illness:   Chief Complaint  Patient presents with  . Establish Care    [facility]  . Medication Refill    Lexapro  . Anxiety  . Depression    Acute Concerns: Abdominal pain -- presented to ED on 06/21/17 for upper abdominal pain, onset after eating "tomato pie, jalapeno poppers, peach lemonade and white rum mashed potatoes, and meat loaf." Reported that his pain felt different than his usual esophageal pain. Work-up was relatively unremarkable, including UA, CBC, lipase and CMP. Pain subsided just as quickly as it started. No more symptoms since. No unintentional weight loss or changes in bowels.  Chronic Issues: Hypertension -- has been on Prinzide 10-12.5mg  x 3-4 years. Does not check his BP. No SOB, chest pain, HA, blurred vision or other symptoms. Hyperlipidemia -- has been on 10 mg atorvastatin for several years, tolerates well, is working on getting a healthier diet and becoming more active Anxiety and depression -- 5mg  Lexapro has work diltiazem for a bit bed very well for him, has tried Celexa and other SSRIs, but he is out of his Lexapro and has been x 4 days. Denies any SI/HI. Nutcracker esophagus -- followed by Dr. Latina Craver [facility] in the past, diagnosed in Apr 2016. He was onut caused nausea and abdominal cramping. He is interested in a new GI doctor as he is transitioning all of his care to [place]. Moles -- patient reports several moles that he has been monitoring have had some changes in size and texture  Health Maintenance: Immunizations -- up to date Diet -- eat all food groups Caffeine intake -- no caffeine 2/2 esophageal spasms Sleep habits -- does snore but has no concerns Exercise -- plays softball and works around the [employer] --  Weight: 249 lb (112.9 kg) -- normal for him, but would like to be around 210 lb Mood -- has a temper   Depression screen PHQ 2/9 06/24/2017  Decreased Interest 0  Down, Depressed, Hopeless 0  PHQ - 2 Score 0  Altered sleeping 1  Tired, decreased energy 0  Change in appetite 0  Feeling bad or failure about yourself  0  Trouble concentrating 0  Moving slowly or fidgety/restless 0  Suicidal thoughts 0  PHQ-9 Score 1    GAD 7 : Generalized Anxiety Score 06/24/2017  Nervous, Anxious, on Edge 0  Control/stop worrying 0  Worry too much - different things 0  Trouble relaxing 1  Restless 0  Easily annoyed or irritable 1  Afraid - awful might happen 0  Total GAD 7 Score 2   Other providers/specialists: Dr. Latina Craver [facility]  Past Medical History:  Diagnosis Date  . Depression   . High cholesterol      Social History   Social History  . Marital status: Divorced    Spouse name: N/A  . Number of children: N/A  . Years of education: N/A   Occupational History  . Not on file.   Social History Main Topics  . Smoking status: Former Research scientist (life sciences)  . Smokeless tobacco: Never Used  . Alcohol use 3.0 oz/week    5 Cans of beer per week  . Drug use: No  . Sexual activity: Yes   Other Topics Concern  . Not on file  Social History Narrative   Married (03/2016)   Lives in [place], [place] (Originally from [place] )   Some family in [place] live in [place].   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   Did play minor league baseball. ([place])   Likes to hunt, fish and plays in competitive soft ball league (slow pitch)   Interest includes trips to [employer] and collecting colorful socks.       Past Surgical History:  Procedure Laterality Date  . esophageal spasms     Was seen by GI doctor -- seen at [facility] for this    Family History  Problem Relation Age of Onset  . Diabetes Mother   . Hyperlipidemia  Mother   . Hypertension Mother   . Heart disease Father   . Hyperlipidemia Father   . Hypertension Father   . Prostate cancer Father 69  . Lung cancer Father 2       Dx 06/18/2017  . Diabetes Sister   . Hyperlipidemia Sister   . Hypertension Sister   . Diabetes Maternal Grandmother   . Heart disease Maternal Grandmother   . Hyperlipidemia Maternal Grandmother   . Hypertension Maternal Grandmother   . Colon cancer Maternal Grandmother   . Heart disease Maternal Grandfather   . Hyperlipidemia Maternal Grandfather   . Hypertension Maternal Grandfather   . Stroke Maternal Grandfather   . Liver cancer Maternal Grandfather   . Heart disease Paternal Grandmother     Allergies  Allergen Reactions  . Diltiazem Hcl Diarrhea    Lethargic as well     Current Medications:   Current Outpatient Prescriptions:  .  acetaminophen (TYLENOL) 500 MG tablet, Take 1,000 mg by mouth every 6 (six) hours as needed for mild pain., Disp: , Rfl:  .  aspirin EC 81 MG tablet, Take 81 mg by mouth daily., Disp: , Rfl:  .  atorvastatin (LIPITOR) 10 MG tablet, Take 10 mg by mouth daily., Disp: , Rfl:  .  diazepam (VALIUM) 5 MG tablet, Take 1 tablet (5 mg total) by mouth every 6 (six) hours as needed (spasm). (Patient not taking: Reported on 06/24/2017), Disp: 10 tablet, Rfl: 0 .  escitalopram (LEXAPRO) 5 MG tablet, Take 1 tablet (5 mg total) by mouth daily., Disp: 90 tablet, Rfl: 1 .  lisinopril-hydrochlorothiazide (PRINZIDE,ZESTORETIC) 10-12.5 MG per tablet, Take 1 tablet by mouth daily., Disp: , Rfl:  .  nitroGLYCERIN (NITROSTAT) 0.4 MG SL tablet, Place 0.4 mg under the tongue every 5 (five) minutes as needed for chest pain., Disp: , Rfl:  .  omeprazole (PRILOSEC) 20 MG capsule, Take 20 mg by mouth 2 (two) times daily before a meal., Disp: , Rfl:    Review of Systems:   Review of Systems  Constitutional: Negative for chills, fever, malaise/fatigue and weight loss.  Respiratory: Negative for cough and  shortness of breath.   Cardiovascular: Negative for chest pain and palpitations.  Gastrointestinal: Negative for abdominal pain, blood in stool, constipation, diarrhea, heartburn, melena, nausea and vomiting.  Neurological: Negative for dizziness and headaches.  Psychiatric/Behavioral: Negative for depression. The patient is not nervous/anxious and does not have insomnia.     Vitals:   Vitals:   06/24/17 1339 06/24/17 1414  BP: (!) 150/100 (!) 130/98  Pulse: 72   Temp: 98.5 F (36.9 C)   TempSrc: Oral   SpO2: 98%   Weight: 249 lb (112.9 kg)   Height: 6\' 2"  (  1.88 m)      Body mass index is 31.97 kg/m.  Physical Exam:   Physical Exam  Constitutional: He appears well-developed. He is cooperative.  Non-toxic appearance. He does not have a sickly appearance. He does not appear ill. No distress.  Cardiovascular: Normal rate, regular rhythm, S1 normal, S2 normal, normal heart sounds and normal pulses.   No LE edema  Pulmonary/Chest: Effort normal and breath sounds normal.  Abdominal: Normal appearance and bowel sounds are normal. There is no tenderness. There is no rigidity, no rebound, no guarding and negative Murphy's sign.  Neurological: He is alert. GCS eye subscore is 4. GCS verbal subscore is 5. GCS motor subscore is 6.  Skin: Skin is warm, dry and intact.  >10 nevi scattered on torso and upper extremities, all various colors/shapes/sizes  Psychiatric: He has a normal mood and affect. His speech is normal and behavior is normal.  Nursing note and vitals reviewed.   Assessment and Plan:    Problem List Items Addressed This Visit      Cardiovascular and Mediastinum   Essential hypertension - Primary    Currently on Prinzide 10-12.5 mg. 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 I told him to follow-up with me in 2 weeks to discuss change in blood pressure medication -- consider increase to 20-12.5 mg dosage. Keep record and  bring for [place] to review. BMP today.      Relevant Orders   Basic metabolic panel (Completed)     Digestive   Nutcracker esophagus    Was unable to tolerate Diltiazem. Would like referral to [facility]. Patient acknowledges need to go to ED if severe chest pain or choking sensations.        Other   GAD (generalized anxiety disorder)    GAD-7 is 2 today. Continue Lexapro 5 mg. No SI/HI.      High cholesterol    Repeat lipid panel. Will adjust simvastatin 10 mg prn. Work on diet and exercise.      Relevant Orders   Lipid panel (Completed)   Depression    PHQ-9 is 1 today. Continue Lexapro 5 mg. No SI/HI.      Relevant Medications   escitalopram (LEXAPRO) 5 MG tablet    Other Visit Diagnoses    Generalized abdominal pain     Resolved. Follow-up prn if symptoms return.    Numerous moles     Provided list of dermatology practices. Recommend full body check and evaluation by dermatologist.      . Reviewed expectations re: course of current medical issues. . Discussed self-management of symptoms. . Outlined signs and symptoms indicating need for more acute intervention. . Patient verbalized understanding and all questions were answered. . See orders for this visit as documented in the electronic medical record. . Patient received an After-Visit Summary.  CMA or LPN served as scribe during this visit. History, Physical, and Plan performed by medical provider. Documentation and orders reviewed and attested to.  Inda Coke, PA-C

## 2017-06-24 NOTE — Patient Instructions (Signed)
It was great to meet you!  We will call you with your lab results.  You will be contacted about your referrals.  Send me a copy of your blood pressure measurements over the next week.

## 2017-06-24 NOTE — Assessment & Plan Note (Addendum)
Was unable to tolerate Diltiazem. Would like referral to [facility]. Patient acknowledges need to go to ED if severe chest pain or choking sensations.

## 2017-06-24 NOTE — Assessment & Plan Note (Signed)
Currently on Prinzide 10-12.5 mg. 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 I told him to follow-up with me in 2 weeks to discuss change in blood pressure medication -- consider increase to 20-12.5 mg dosage. Keep record and bring for [place] to review. BMP today.

## 2017-06-28 ENCOUNTER — Telehealth: Payer: Self-pay | Admitting: Physician Assistant

## 2017-06-28 NOTE — Telephone Encounter (Signed)
Patient dropped off FMLA paperwork to be filled out. Patient stated he can not attend wife's appointments without having this filled out. Paperwork is in provider's folder in front office.

## 2017-06-29 ENCOUNTER — Encounter: Payer: Self-pay | Admitting: Gastroenterology

## 2017-06-29 NOTE — Telephone Encounter (Signed)
Left message on voicemail to call office.  

## 2017-06-29 NOTE — Telephone Encounter (Signed)
Pt called back, told him I received FMLA paperwork what appointments is he talking about because she does not have a follow up with Samantha. Pt said his wife will have an upcoming appt with Hematology and he can not go unless paperwork is filled out. Told him normally if you want to go to Hematology appointments with spouse. Hematology would have to fill paperwork out. Pt said they don't know when appointment is yet but must be filled out to go with her he was told by his HR rep. Told him I will see if we can do it for just one appointment but any further appointments will have to be done by them. Pt verbalized understanding. Told pt to call me when appointment is. Pt verbalized understanding.

## 2017-06-30 ENCOUNTER — Telehealth: Payer: Self-pay | Admitting: Physician Assistant

## 2017-06-30 NOTE — Telephone Encounter (Signed)
ROI faxed to [place]

## 2017-07-05 ENCOUNTER — Ambulatory Visit (INDEPENDENT_AMBULATORY_CARE_PROVIDER_SITE_OTHER): Payer: Managed Care, Other (non HMO) | Admitting: *Deleted

## 2017-07-05 DIAGNOSIS — R0600 Dyspnea, unspecified: Secondary | ICD-10-CM

## 2017-07-05 DIAGNOSIS — R0609 Other forms of dyspnea: Secondary | ICD-10-CM

## 2017-07-06 ENCOUNTER — Telehealth: Payer: Self-pay | Admitting: Physician Assistant

## 2017-07-06 NOTE — Telephone Encounter (Signed)
Patient returning a phone call from [place]. Okay to leave a detailed message on phone. Patient sleeps during the day due to working at night, may miss call. Please advise.

## 2017-07-09 NOTE — Telephone Encounter (Signed)
See result note.  

## 2017-07-29 ENCOUNTER — Telehealth: Payer: Self-pay | Admitting: Physician Assistant

## 2017-07-29 NOTE — Telephone Encounter (Signed)
MEDICATION: escitalopram (LEXAPRO) 5 MG tablet  PHARMACY:  [id number] - [place], [place] - [address] [phone] (Phone) [phone] (Fax)   IS THIS A 90 DAY SUPPLY : yes  IS PATIENT OUT OF MEDICATION: yes; never was able to pick up; went out of town  IF NOT; HOW MUCH IS LEFT: n/a  LAST APPOINTMENT DATE:06/24/17  NEXT APPOINTMENT DATE:n/a  OTHER COMMENTS:    **Let patient know to contact pharmacy at the end of the day to make sure medication is ready. **  ** Please notify patient to allow 48-72 hours to process**  **Encourage patient to contact the pharmacy for refills or they can request refills through [facility]**

## 2017-07-29 NOTE — Telephone Encounter (Signed)
Left message on voicemail that Rx is at pharmacy just need to go by and pickup. Any questions please call office.

## 2017-07-29 NOTE — Telephone Encounter (Signed)
Pt called back, told him pharmacy has Rx for Lexapro that just had it on hold. I told them to fill it, you just need to go and pickup Rx. Pt verbalized understanding. Asked pt if ever started Rx? Pt said no, he was sent out of town next day for work and has been out of town x 3 weeks. Told pt okay.

## 2017-08-03 NOTE — Telephone Encounter (Signed)
Left message on voicemail to call office. Need to discuss FMLA paperwork.

## 2017-08-04 ENCOUNTER — Encounter: Payer: Self-pay | Admitting: Physician Assistant

## 2017-08-04 ENCOUNTER — Ambulatory Visit (INDEPENDENT_AMBULATORY_CARE_PROVIDER_SITE_OTHER): Payer: Managed Care, Other (non HMO) | Admitting: Physician Assistant

## 2017-08-04 VITALS — BP 140/100 | HR 75 | Temp 98.3°F | Ht 74.0 in | Wt 254.0 lb

## 2017-08-04 DIAGNOSIS — R0600 Dyspnea, unspecified: Secondary | ICD-10-CM

## 2017-08-04 DIAGNOSIS — R0609 Other forms of dyspnea: Secondary | ICD-10-CM | POA: Diagnosis not present

## 2017-08-04 DIAGNOSIS — I10 Essential (primary) hypertension: Secondary | ICD-10-CM | POA: Diagnosis not present

## 2017-08-04 DIAGNOSIS — K047 Periapical abscess without sinus: Secondary | ICD-10-CM | POA: Diagnosis not present

## 2017-08-04 MED ORDER — LISINOPRIL-HYDROCHLOROTHIAZIDE 20-25 MG PO TABS: 1.0000 | ORAL_TABLET | Freq: Every day | ORAL | 0 refills | Status: DC

## 2017-08-04 NOTE — Progress Notes (Addendum)
Wesley Hancock is a 46 y.o. male is here to follow up on Hypertension.  I acted as a Education administrator for [employer], PA-C Anselmo Pickler, LPN  History of Present Illness:   Chief Complaint  Patient presents with  . Follow-up  . Hypertension    Hypertension  This is a chronic (Pt here for one month follow up on blood pressure. Pt taking blood pressure at home has been ranging Systolic [id number] and Diastolic 89-381) problem. The problem is unchanged. The problem is uncontrolled. Associated symptoms include shortness of breath. Pertinent negatives include no chest pain, headaches, malaise/fatigue or orthopnea. Risk factors for coronary artery disease include dyslipidemia. The current treatment provides mild improvement. There are no compliance problems.    Taking medications daily, denies any significant side effects. Endorses SOB and "wheezing" with activity. Has seen a cardiologist -- has been several years and was seen because of the Nutcracker Esophagus was causing chest pain and they were trying to rule out his heart. Father had MI in early 29's. Denies excessive caffeine intake. Is working more than usual, but is planning to go to the beach next week for a week long vacation with wife and family.  CT chest Dec 2015 --> 36mm subpleural nodule in the right upper lobe. 2 mm fissural nodule in the right middle lobe. There is a background of mild centrilobular emphysema and endobronchial thickening. Groundglass opacities within the lung bases with several small cystic spaces suggest an element of desquamative interstitial pneumonitis. There is no adenopathy within chest. No effusions. Heart size is normal. There no suspicious osseous lesions. There is a well-corticated 6 mm lucency in themanubrium which has a benign appearance.  He has not seen a pulmonologist for work-up of "desquamative interstitial pneumonitis."  Nuclear Medicine Stress Test 09/20/2015 --> normal Lexiscan Cardiolite  09/20/2015 --> no sx suggestive of ischemia  The 10-year ASCVD risk score Mikey Bussing DC Jr., et al., 2013) is: 3%   Values used to calculate the score:     Age: 4 years     Sex: Male     Is Non-Hispanic African American: No     Diabetic: No     Tobacco smoker: No     Systolic Blood Pressure: 017 mmHg     Is BP treated: Yes     HDL Cholesterol: 42.4 mg/dL     Total Cholesterol: 186 mg/dL   Dental Abscess Patient reports 3-4 days of right upper tooth pain. He has been told that he may have a cavity that needs to be fixed. He has had some purulent discharge from this area. He is wondering if he can get antibiotic today prior to him making his appointment to see a dentist. Denies fevers, chills, lymph node swelling.  Health Maintenance Due  Topic Date Due  . HIV Screening  04/02/1987    Past Medical History:  Diagnosis Date  . Depression   . High cholesterol      Social History   Social History  . Marital status: Divorced    Spouse name: N/A  . Number of children: N/A  . Years of education: N/A   Occupational History  . Not on file.   Social History Main Topics  . Smoking status: Former Research scientist (life sciences)  . Smokeless tobacco: Never Used  . Alcohol use 3.0 oz/week    5 Cans of beer per week  . Drug use: No  . Sexual activity: Yes   Other Topics Concern  . Not on file  Social History Narrative   Married (03/2016)   Lives in [place], [place] (Originally from [place] )   Some family in [place] live in [place].   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   Did play [place]. ([place]   Likes to hunt, fish and plays in competitive soft ball league (slow pitch)   Interest includes trips to [employer] and collecting colorful socks.       Past Surgical History:  Procedure Laterality Date  . esophageal spasms     Was seen by GI doctor -- seen at [facility] for this    Family History  Problem Relation Age of Onset  .  Diabetes Mother   . Hyperlipidemia Mother   . Hypertension Mother   . Heart disease Father 68  . Hyperlipidemia Father   . Hypertension Father   . Prostate cancer Father 65  . Lung cancer Father 62       Dx 06/18/2017  . Diabetes Sister   . Hyperlipidemia Sister   . Hypertension Sister   . Diabetes Maternal Grandmother   . Heart disease Maternal Grandmother   . Hyperlipidemia Maternal Grandmother   . Hypertension Maternal Grandmother   . Colon cancer Maternal Grandmother   . Heart disease Maternal Grandfather   . Hyperlipidemia Maternal Grandfather   . Hypertension Maternal Grandfather   . Stroke Maternal Grandfather   . Liver cancer Maternal Grandfather   . Heart disease Paternal Grandmother     PMHx, SurgHx, SocialHx, FamHx, Medications, and Allergies were reviewed in the Visit Navigator and updated as appropriate.   Patient Active Problem List   Diagnosis Date Noted  . High cholesterol 06/24/2017  . Depression 06/24/2017  . Nutcracker esophagus 06/24/2017  . GAD (generalized anxiety disorder) 08/06/2016  . Essential hypertension 09/23/2014    Social History  Substance Use Topics  . Smoking status: Former Research scientist (life sciences)  . Smokeless tobacco: Never Used  . Alcohol use 3.0 oz/week    5 Cans of beer per week    Current Medications and Allergies:    Current Outpatient Prescriptions:  .  acetaminophen (TYLENOL) 500 MG tablet, Take 1,000 mg by mouth every 6 (six) hours as needed for mild pain., Disp: , Rfl:  .  aspirin EC 81 MG tablet, Take 81 mg by mouth daily., Disp: , Rfl:  .  atorvastatin (LIPITOR) 10 MG tablet, Take 10 mg by mouth daily., Disp: , Rfl:  .  diazepam (VALIUM) 5 MG tablet, Take 1 tablet (5 mg total) by mouth every 6 (six) hours as needed (spasm)., Disp: 10 tablet, Rfl: 0 .  escitalopram (LEXAPRO) 5 MG tablet, Take 1 tablet (5 mg total) by mouth daily., Disp: 90 tablet, Rfl: 1 .  Fexofenadine-Pseudoephedrine (ALLEGRA-D 24 HOUR PO), Take 1 tablet by mouth  daily., Disp: , Rfl:  .  nitroGLYCERIN (NITROSTAT) 0.4 MG SL tablet, Place 0.4 mg under the tongue every 5 (five) minutes as needed for chest pain., Disp: , Rfl:  .  Omega-3 Fatty Acids (FISH OIL) 1000 MG CAPS, Take 1 capsule by mouth 2 (two) times daily., Disp: , Rfl:  .  omeprazole (PRILOSEC) 20 MG capsule, Take 20 mg by mouth 2 (two) times daily before a meal., Disp: , Rfl:  .  lisinopril-hydrochlorothiazide (PRINZIDE,ZESTORETIC) 20-25 MG tablet, Take 1 tablet by mouth daily., Disp: 90 tablet, Rfl: 0   Allergies  Allergen Reactions  . Diltiazem Hcl Diarrhea    Lethargic  as well    Review of Systems   Review of Systems  Constitutional: Negative for chills, fever, malaise/fatigue and weight loss.  Respiratory: Positive for shortness of breath. Negative for cough.   Cardiovascular: Negative for chest pain, orthopnea, claudication and leg swelling.  Gastrointestinal: Negative for heartburn, nausea and vomiting.  Neurological: Negative for dizziness, tingling and headaches.    Vitals:   Vitals:   08/04/17 0916  BP: (!) 140/100  Pulse: 75  Temp: 98.3 F (36.8 C)  TempSrc: Oral  SpO2: 97%  Weight: 254 lb (115.2 kg)  Height: 6\' 2"  (1.88 m)     Body mass index is 32.61 kg/m.   Physical Exam:    Physical Exam  Constitutional: He appears well-developed. He is cooperative.  Non-toxic appearance. He does not have a sickly appearance. He does not appear ill. No distress.  HENT:  Multiple teeth with fillings. Small area of tenderness along right upper gumline with palpation of tongue depressor. No evidence of discharge.  Cardiovascular: Normal rate, regular rhythm, S1 normal, S2 normal, normal heart sounds and normal pulses.   No LE edema  Pulmonary/Chest: Effort normal and breath sounds normal.  Neurological: He is alert. GCS eye subscore is 4. GCS verbal subscore is 5. GCS motor subscore is 6.  Skin: Skin is warm, dry and intact.  Psychiatric: He has a normal mood and affect.  His speech is normal and behavior is normal.  Nursing note and vitals reviewed.  EKG tracing is personally reviewed.  EKG notes NSR.  No acute changes.    Assessment and Plan:    Xion was seen today for follow-up and hypertension.  Diagnoses and all orders for this visit:  Dyspnea on exertion EKG tracing is personally reviewed.  EKG notes NSR.  No acute changes. Will refer to pulmonary given hx of lung nodules and CT findings of "desquamative interstitial pneumonitis." Also, given significant family history of cardiac issues, he is agreeable to cardiology referral for further investigation of dyspnea on exertion.  Discussed that if symptoms worsen or he develops chest pain to seek medical attention immediately. -     EKG 12-Lead  Essential hypertension Remains uncontrolled. Will increase Prinzide per orders. Continue to monitor BP. Follow-up in 4-6 weeks for reassessment of BP. Recheck BMP at that time.  Dental Abscess Will treat with Augmentin per orders. I advised patient to follow-up with dentist for appropriate care and any procedures that might be needed.  Other orders -     lisinopril-hydrochlorothiazide (PRINZIDE,ZESTORETIC) 20-25 MG tablet; Take 1 tablet by mouth daily.   . Reviewed expectations re: course of current medical issues. . Discussed self-management of symptoms. . Outlined signs and symptoms indicating need for more acute intervention. . Patient verbalized understanding and all questions were answered. . See orders for this visit as documented in the electronic medical record. . Patient received an After Visit Summary.  CMA or LPN served as scribe during this visit. History, Physical, and Plan performed by medical provider. Documentation and orders reviewed and attested to.  Inda Coke, PA-C [place], [place] 08/04/2017  Follow-up: Return in about 6 weeks (around 09/15/2017) for blood pressure f/u.

## 2017-08-04 NOTE — Telephone Encounter (Signed)
Pt here today for office visit and I asked him about FMLA paperwork. Pt said he does not need it right now. His wife's first appt is Sept 7th Friday and he is off from work. Pt said I can disregard the papers. Told him okay I will throw them away. Pt verbalized understanding.

## 2017-08-04 NOTE — Patient Instructions (Signed)
Start your new blood pressure medication.  Continue to monitor your blood pressure.  Follow-up with [place] in 4-6 weeks to assess your blood pressure.  I would like to send you to pulmonology and cardiology, I will put these referrals in for you, please contact our office if you haven't heard anything within 2 weeks.

## 2017-08-05 MED ORDER — AMOXICILLIN-POT CLAVULANATE 875-125 MG PO TABS: 1.0000 | ORAL_TABLET | Freq: Two times a day (BID) | ORAL | 0 refills | Status: AC

## 2017-08-05 NOTE — Addendum Note (Signed)
Addended by: Erlene Quan on: 08/05/2017 05:01 PM   Modules accepted: Orders

## 2017-08-19 ENCOUNTER — Encounter: Payer: Self-pay | Admitting: Gastroenterology

## 2017-08-19 ENCOUNTER — Ambulatory Visit (INDEPENDENT_AMBULATORY_CARE_PROVIDER_SITE_OTHER): Payer: Managed Care, Other (non HMO) | Admitting: Gastroenterology

## 2017-08-19 VITALS — BP 130/90 | HR 87 | Ht 73.5 in | Wt 253.0 lb

## 2017-08-19 DIAGNOSIS — R0789 Other chest pain: Secondary | ICD-10-CM | POA: Diagnosis not present

## 2017-08-19 DIAGNOSIS — F411 Generalized anxiety disorder: Secondary | ICD-10-CM

## 2017-08-19 MED ORDER — DILTIAZEM HCL 30 MG PO TABS: 30.0000 mg | ORAL_TABLET | Freq: Four times a day (QID) | ORAL | 1 refills | Status: DC

## 2017-08-19 NOTE — Progress Notes (Signed)
[name] Consult Note:  History: Wesley Hancock 08/19/2017  Referring physician: Inda Coke, PA  Reason for consult/chief complaint: nutcracker esophagus (pt states he was diagnosed in [place] years ago and treated at [facility] more recently; pt reprorts intermittent episodes of severe chest pain often radiating through to his back)   Subjective  HPI:  This is a 46 year old man referred by primary care noted above for long-standing noncardiac chest pain. He was seen by a GI doctor in [place] 7 or 8 years ago, and reports that he had chronic severe chest pain that would sometimes radiate into the jaw and down the arm. He reports having multiple cardiac workups over the years. That GI physician in [place] eventually did upper endoscopy and pH and motility testing. He was told he had "some reflux" and Nutcracker esophagus. After moving here he was seen by the GI clinic at [facility], and I have a single office note and a manometry study from that visit. He reports having very little improvement on nitroglycerin, and was intolerant of the headache it gave him. Diltiazem seem to give him diarrhea and abdominal pain, so that was stopped as well. In retrospect, he wonders if other stressors may been going on at the time to cause the side effects. He admits that this problem has given him chronic anxiety, which she also previously suffered from at least in part because he and his wife lost her 48-year-old child to cerebral palsy a few years back.  He did not go back to see the [facility] clinic, because he felt they have not been very helpful, and had not contacted him for follow-up as he expected. Those records include a high resolution manometry study that is normal, the pH study did not accompany the records that were sent to [place]. He also admits to ongoing concerns at this chest pain could someday be cardiac in nature. He is concerned because his father is apparently dying  from congestive heart failure in his early 18s. Parke has been to the ED both in March and July of this year, and again reports that multiple stress tests have been normal over the years.  Wesley Hancock is concerned because he feels his problem will go on in the future and he does not how to manage it, and wonders if there are any other treatments.  ROS:  Review of Systems  Constitutional: Negative for appetite change and unexpected weight change.  HENT: Negative for mouth sores and voice change.   Eyes: Negative for pain and redness.  Respiratory: Negative for cough and shortness of breath.   Cardiovascular: Negative for chest pain and palpitations.  Genitourinary: Negative for dysuria and hematuria.  Musculoskeletal: Negative for arthralgias and myalgias.  Skin: Negative for pallor and rash.  Neurological: Negative for weakness and headaches.  Hematological: Negative for adenopathy.  Psychiatric/Behavioral: The patient is nervous/anxious.    He reports a "short temper".  Past Medical History: Past Medical History:  Diagnosis Date  . Anxiety   . Depression   . High cholesterol   . Hyperlipidemia   . Hypertension   . Nutcracker esophagus      Past Surgical History: Past Surgical History:  Procedure Laterality Date  . esophageal spasms     Was seen by GI doctor -- seen at [facility] for this     Family History: Family History  Problem Relation Age of Onset  . Diabetes Mother   . Hyperlipidemia Mother   . Hypertension Mother   .  Heart disease Father 32  . Hyperlipidemia Father   . Hypertension Father   . Prostate cancer Father 80  . Lung cancer Father 53       Dx 06/18/2017  . Diabetes Sister   . Hyperlipidemia Sister   . Hypertension Sister   . Diabetes Maternal Grandmother   . Heart disease Maternal Grandmother   . Hyperlipidemia Maternal Grandmother   . Hypertension Maternal Grandmother   . Colon cancer Maternal Grandmother   . Heart disease Maternal Grandfather   .  Hyperlipidemia Maternal Grandfather   . Hypertension Maternal Grandfather   . Stroke Maternal Grandfather   . Liver cancer Maternal Grandfather   . Heart disease Paternal Grandmother     Social History: Social History   Social History  . Marital status: Divorced    Spouse name: N/A  . Number of children: N/A  . Years of education: N/A   Social History Main Topics  . Smoking status: Former Research scientist (life sciences)  . Smokeless tobacco: Never Used  . Alcohol use 3.0 oz/week    5 Cans of beer per week  . Drug use: No  . Sexual activity: Yes   Other Topics Concern  . None   Social History Narrative   Married (03/2016)   Lives in [place], [place] (Originally from [place] )   Some family in [place] live in [place].   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   Did play minor league baseball. ([place])   Likes to hunt, fish and plays in competitive soft ball league (slow pitch)   Interest includes trips to [employer] and collecting colorful socks.      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  Allergies: Allergies  Allergen Reactions  . Diltiazem Hcl Diarrhea    Lethargic as well    Outpatient Meds: Current Outpatient Prescriptions  Medication Sig Dispense Refill  . acetaminophen (TYLENOL) 500 MG tablet Take 1,000 mg by mouth every 6 (six) hours as needed for mild pain.    Marland Kitchen aspirin EC 81 MG tablet Take 81 mg by mouth daily.    Marland Kitchen atorvastatin (LIPITOR) 10 MG tablet Take 10 mg by mouth daily.    . diazepam (VALIUM) 5 MG tablet Take 1 tablet (5 mg total) by mouth every 6 (six) hours as needed (spasm). 10 tablet 0  . escitalopram (LEXAPRO) 5 MG tablet Take 1 tablet (5 mg total) by mouth daily. 90 tablet 1  . Fexofenadine-Pseudoephedrine (ALLEGRA-D 24 HOUR PO) Take 1 tablet by mouth daily.      Marland Kitchen lisinopril-hydrochlorothiazide (PRINZIDE,ZESTORETIC) 20-25 MG tablet Take 1 tablet by mouth daily. 90 tablet 0  . nitroGLYCERIN (NITROSTAT) 0.4 MG SL tablet Place 0.4 mg under the tongue every 5 (five) minutes as needed for chest pain.    . Omega-3 Fatty Acids (FISH OIL) 1000 MG CAPS Take 1 capsule by mouth 2 (two) times daily.    Marland Kitchen omeprazole (PRILOSEC) 20 MG capsule Take 20 mg by mouth 2 (two) times daily before a meal.    . diltiazem (CARDIZEM) 30 MG tablet Take 1 tablet (30 mg total) by mouth 4 (four) times daily. 120 tablet 1   No current facility-administered medications for this visit.       ___________________________________________________________________ Objective   Exam:  BP 130/90  Pulse 87   Ht 6' 1.5" (1.867 m)   Wt 253 lb (114.8 kg)   BMI 32.93 kg/m    General: this is a(n) Well-appearing and pleasant, well-nourished man  Eyes: sclera anicteric, no redness  ENT: oral mucosa moist without lesions, no cervical or supraclavicular lymphadenopathy, good dentition  CV: RRR without murmur, S1/S2, no JVD, no peripheral edema  Resp: clear to auscultation bilaterally, normal RR and effort noted  GI: soft, no tenderness, with active bowel sounds. No guarding or palpable organomegaly noted.  Skin; warm and dry, no rash or jaundice noted  Neuro: awake, alert and oriented x 3. Normal gross motor function and fluent speech  Labs: Outside records as noted above  CBC Latest Ref Rng & Units 06/21/2017 02/08/2016 02/23/2015  WBC 4.0 - 10.5 K/uL 7.4 6.7 6.8  Hemoglobin 13.0 - 17.0 g/dL 16.4 15.6 16.1  Hematocrit 39.0 - 52.0 % 46.1 46.1 46.3  Platelets 150 - 400 K/uL 276 266 263   CMP Latest Ref Rng & Units 06/24/2017 06/21/2017 02/08/2016  Glucose 70 - 99 mg/dL 92 140(H) 129(H)  BUN 6 - 23 mg/dL 20 17 19   Creatinine 0.40 - 1.50 mg/dL 1.18 1.26(H) 1.05  Sodium 135 - 145 mEq/L 138 139 137  Potassium 3.5 - 5.1 mEq/L 4.4 4.0 3.7  Chloride 96 - 112 mEq/L 100 103 104  CO2  19 - 32 mEq/L 30 29 25   Calcium 8.4 - 10.5 mg/dL 10.3 9.5 9.8  Total Protein 6.5 - 8.1 g/dL - 8.3(H) -  Total Bilirubin 0.3 - 1.2 mg/dL - 0.8 -  Alkaline Phos 38 - 126 U/L - 52 -  AST 15 - 41 U/L - 31 -  ALT 17 - 63 U/L - 47 -   Multiple negative troponin levels for the last 3 years Multiple EKGs on file over the last 3 years. No ischemic changes on 08/04/17  Assessment: Encounter Diagnoses  Name Primary?  . Non-cardiac chest pain Yes  . GAD (generalized anxiety disorder)     This patient has noncardiac chest pain, and it is unclear how much gastroesophageal reflux he truly has. We will request the results of the pH study reportedly done at [facility] in April 2016. It would be interesting to note this was done on or off PPI, and whether he had anything more than physiologic reflux. I've explained to him that this is essentially a chronic pain problem from apparent visceral hypersensitivity, whether or not he has any demonstrable reflux. As such, treatment options, especially by gastroenterology, are limited. And I agree with him that there is certainly an anxiety overlay to this. That said, I have reassured him that it is not "in his head" as he reports having been told by his original gastroenterologist. I have asked him if he perhaps he had considered an increased dose of Lexapro, which she says his primary care provider recently offered. He has been giving that some more consideration. 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  Plan:  He would like to research it further, and also give another try at short acting diltiazem to see if he might tolerate it better this time. Diltiazem 30 mg, 1 by mouth 4 times daily  I offered him follow-up in a few weeks and/or a phone call to our office within a couple  weeks to give a symptom  follow-up. He will prefer just to call, and then make possible follow-up office plans accordingly. I have told him that if this medicine does seem to help, I would need to see him periodically in the office to reassess symptoms and medicine.  Thank you for the courtesy of this consult.  Please call me with any questions or concerns.  Nelida Meuse III  CC: Inda Coke, [place]

## 2017-08-19 NOTE — Patient Instructions (Signed)
If you are age 45 or older, your body mass index should be between 23-30. Your Body mass index is 32.93 kg/m. If this is out of the aforementioned range listed, please consider follow up with your Primary Care Provider.  If you are age 57 or younger, your body mass index should be between 19-25. Your Body mass index is 32.93 kg/m. If this is out of the aformentioned range listed, please consider follow up with your Primary Care Provider.   Please call in 2 weeks with an update. Ask for Maurine Minister [phone]  Thank you for choosing [facility]  Dr Wilfrid Lund III

## 2017-08-31 ENCOUNTER — Ambulatory Visit: Payer: Managed Care, Other (non HMO) | Admitting: Cardiovascular Disease

## 2017-09-08 ENCOUNTER — Ambulatory Visit (INDEPENDENT_AMBULATORY_CARE_PROVIDER_SITE_OTHER): Payer: Managed Care, Other (non HMO) | Admitting: Internal Medicine

## 2017-09-08 ENCOUNTER — Encounter: Payer: Self-pay | Admitting: Internal Medicine

## 2017-09-08 VITALS — BP 140/80 | HR 103 | Ht 73.0 in | Wt 254.1 lb

## 2017-09-08 DIAGNOSIS — R0600 Dyspnea, unspecified: Secondary | ICD-10-CM

## 2017-09-08 DIAGNOSIS — R0609 Other forms of dyspnea: Secondary | ICD-10-CM | POA: Diagnosis not present

## 2017-09-08 DIAGNOSIS — Z23 Encounter for immunization: Secondary | ICD-10-CM | POA: Diagnosis not present

## 2017-09-08 DIAGNOSIS — I10 Essential (primary) hypertension: Secondary | ICD-10-CM | POA: Diagnosis not present

## 2017-09-08 HISTORY — DX: Dyspnea, unspecified: R06.00

## 2017-09-08 HISTORY — DX: Other forms of dyspnea: R06.09

## 2017-09-08 MED ORDER — TELMISARTAN-HCTZ 80-25 MG PO TABS: 1.0000 | ORAL_TABLET | Freq: Every day | ORAL | 11 refills | Status: DC

## 2017-09-08 NOTE — Assessment & Plan Note (Signed)
In the best review of chronic cough to date ( NEJM 2016 375 [phone]) ,  ACEi are now felt to cause cough in up to  20% of pts which is a 4 fold increase from previous reports and does not include the variety of non-specific complaints we see in pulmonary clinic in pts on ACEi but previously attributed to another dx like  Copd/asthma and  include PNDS, throat and chest congestion, "bronchitis", unexplained dyspnea and noct "strangling" sensations, and hoarseness, but also  atypical /refractory GERD symptoms like dysphagia and "bad heartburn"   The only way I know  to prove this is not an "ACEi Case" is a trial off ACEi x a minimum of 6 weeks then regroup.   Try micardis 80-25 one daily and f/u in 6 weeks if not better - if all better Follow up per Primary Care planned

## 2017-09-08 NOTE — Assessment & Plan Note (Signed)
"  quit smoking" 2015 with onset of symptoms in 2016  Spirometry 09/08/2017  Flat f/v loop  - d/c acei 09/08/2017   When respiratory symptoms begin or become refractory well after a patient reports complete smoking cessation,  Especially when this wasn't the case while they were smoking, a red flag is raised based on the work of Dr Kris Mouton which states:  if you quit smoking when your best day FEV1 is still well preserved it is highly unlikely you will progress to severe disease.  That is to say, once the smoking stops,  the symptoms should not suddenly erupt or markedly worsen.  If so, the differential diagnosis should include  obesity/deconditioning,  LPR/Reflux/Aspiration syndromes,  occult CHF, or  especially side effect of medications commonly used in this population esp ACEi   Will first try off acei then return in 6 weeks with repeat pfts/ cxr if not better    Total time devoted to counseling  > 50 % of initial 60 min office visit:  review case with pt/ discussion of options/alternatives/ personally creating written customized instructions  in presence of pt  then going over those specific  Instructions directly with the pt including how to use all of the meds but in particular covering each new medication in detail and the difference between the maintenance= "automatic" meds and the prns using an action plan format for the latter (If this problem/symptom => do that organization reading Left to right).  Please see AVS from this visit for a full list of these instructions which I personally wrote for this pt and  are unique to this visit.

## 2017-09-08 NOTE — Progress Notes (Signed)
Subjective:     Patient ID: Wesley Hancock, male   DOB: August 21, 1971,     MRN: [id number]  HPI  35 yowm quit smoking regularly (still smokes when drinking a lot) around 2015  And typically  very active works delivering auto parts then around 2016 new onset variable sob so referred to pulmonary clinic 09/08/2017 by Inda Coke   09/08/2017 1st [facility] office visit/ Wesley Hancock   Chief Complaint  Patient presents with  . pulmonary consult    referred by Dr. Morene Rankins for tightness in chest and SOB. pt had a scan done over a year ago with results of lung damage  indolent onset progressively doe x 2 years variably occurring with "almost any activity" but def with steps -   also sob at rest  X sev minutes p relaxing and worse when driving assoc with sense of chest congestion but s excess/ purulent sputum or mucus plugs  And sleeps ok  Severe gerd and sense of pnds during the same period his sob is worse  Never tried inhalers   No obvious day to day or daytime variability or assoc excess/ purulent sputum or mucus plugs or hemoptysis or cp or chest tightness, subjective wheeze.   No unusual exp hx or h/o childhood pna/ asthma or knowledge of premature birth.  Sleeping ok flat without nocturnal  or early am exacerbation  of respiratory  c/o's or need for noct saba. Also denies any obvious fluctuation of symptoms with weather or environmental changes or other aggravating or alleviating factors except as outlined above   Current Allergies, Complete Past Medical History, Past Surgical History, Family History, and Social History were reviewed in [employer] record.  ROS  The following are not active complaints unless bolded Hoarseness, sore throat, dysphagia, dental problems, itching, sneezing,  nasal congestion or discharge of excess mucus or purulent secretions, ear ache,   fever, chills, sweats, unintended wt loss or wt gain, classically pleuritic or exertional cp,   orthopnea pnd or leg swelling, presyncope, palpitations, abdominal pain, anorexia, nausea, vomiting, diarrhea  or change in bowel habits or change in bladder habits, change in stools or change in urine, dysuria, hematuria,  rash, arthralgias, visual complaints, headache, numbness, weakness or ataxia or problems with walking or coordination,  change in mood/affect or memory.        Current Meds  Medication Sig  . acetaminophen (TYLENOL) 500 MG tablet Take 1,000 mg by mouth every 6 (six) hours as needed for mild pain.  Marland Kitchen aspirin EC 81 MG tablet Take 81 mg by mouth daily.  Marland Kitchen atorvastatin (LIPITOR) 10 MG tablet Take 10 mg by mouth daily.  . diazepam (VALIUM) 5 MG tablet Take 1 tablet (5 mg total) by mouth every 6 (six) hours as needed (spasm).  Marland Kitchen diltiazem (CARDIZEM) 30 MG tablet Take 1 tablet (30 mg total) by mouth 4 (four) times daily.  Marland Kitchen escitalopram (LEXAPRO) 5 MG tablet Take 1 tablet (5 mg total) by mouth daily.  Marland Kitchen Fexofenadine-Pseudoephedrine (ALLEGRA-D 24 HOUR PO) Take 1 tablet by mouth daily.  . nitroGLYCERIN (NITROSTAT) 0.4 MG SL tablet Place 0.4 mg under the tongue every 5 (five) minutes as needed for chest pain.  . Omega-3 Fatty Acids (FISH OIL) 1000 MG CAPS Take 1 capsule by mouth 2 (two) times daily.  Marland Kitchen omeprazole (PRILOSEC) 20 MG capsule Take 20 mg by mouth 2 (two) times daily before a meal.  . [  lisinopril-hydrochlorothiazide (PRINZIDE,ZESTORETIC) 20-25 MG tablet Take 1 tablet by  mouth daily.                   Review of Systems     Objective:   Physical Exam amb wm nad   Wt Readings from Last 3 Encounters:  09/08/17 254 lb 2 oz (115.3 kg)  08/19/17 253 lb (114.8 kg)  08/04/17 254 lb (115.2 kg)    Vital signs reviewed   - Note on arrival 02 sats  95% on RA    HEENT: nl dentition, turbinates bilaterally, and oropharynx. Nl external ear canals without cough reflex   NECK :  without JVD/Nodes/TM/ nl carotid upstrokes bilaterally   LUNGS: no acc muscle use,  Nl  contour chest which is clear to A and P bilaterally without cough on insp or exp maneuvers   CV:  RRR  no s3 or murmur or increase in P2, and no edema   ABD:  Obese soft and nontender with nl inspiratory excursion in the supine position. No bruits or organomegaly appreciated, bowel sounds nl  MS:  Nl gait/ ext warm without deformities, calf tenderness, cyanosis or clubbing No obvious joint restrictions   SKIN: warm and dry without lesions    NEURO:  alert, approp, nl sensorium with  no motor or cerebellar deficits apparent.    No recent cxr on file       Assessment:

## 2017-09-08 NOTE — Patient Instructions (Addendum)
Stop prinizide and start micardis 80-25 one daily in its place and symptoms should gradually resolve   GERD (REFLUX)  is an extremely common cause of respiratory symptoms just like yours , many times with no obvious heartburn at all.    It can be treated with medication, but also with lifestyle changes including elevation of the head of your bed (ideally with 6 inch  bed blocks),  Complete Smoking cessation, avoidance of late meals, excessive alcohol, and avoid fatty foods, chocolate, peppermint, colas, red wine, and acidic juices such as orange juice.  NO MINT OR MENTHOL PRODUCTS SO NO COUGH DROPS  USE SUGARLESS CANDY INSTEAD (Jolley ranchers or Stover's or Life Savers) or even ice chips will also do - the key is to swallow to prevent all throat clearing. NO OIL BASED VITAMINS - use powdered substitutes.     If you are satisfied with your treatment plan,  let your doctor know and he/she can either refill your medications or you can return here when your prescription runs out.     If in any way you are not 100% satisfied,  please tell us.  If 100% better, tell your friends!  Pulmonary follow up is as needed

## 2017-09-13 ENCOUNTER — Ambulatory Visit (INDEPENDENT_AMBULATORY_CARE_PROVIDER_SITE_OTHER): Payer: Managed Care, Other (non HMO) | Admitting: Physician Assistant

## 2017-09-13 ENCOUNTER — Encounter: Payer: Self-pay | Admitting: Physician Assistant

## 2017-09-13 VITALS — BP 122/88 | HR 85 | Temp 98.7°F | Ht 73.0 in | Wt 258.5 lb

## 2017-09-13 DIAGNOSIS — E7800 Pure hypercholesterolemia, unspecified: Secondary | ICD-10-CM

## 2017-09-13 DIAGNOSIS — I10 Essential (primary) hypertension: Secondary | ICD-10-CM | POA: Diagnosis not present

## 2017-09-13 DIAGNOSIS — F411 Generalized anxiety disorder: Secondary | ICD-10-CM

## 2017-09-13 DIAGNOSIS — Z114 Encounter for screening for human immunodeficiency virus [HIV]: Secondary | ICD-10-CM

## 2017-09-13 LAB — BASIC METABOLIC PANEL
BUN: 19 mg/dL (ref 6–23)
CO2: 29 mEq/L (ref 19–32)
Calcium: 10.2 mg/dL (ref 8.4–10.5)
Chloride: 98 mEq/L (ref 96–112)
Creatinine, Ser: 1.09 mg/dL (ref 0.40–1.50)
GFR: 77.6 mL/min (ref >60.00)
Glucose, Bld: 70 mg/dL (ref 70–99)
Potassium: 4 mEq/L (ref 3.5–5.1)
Sodium: 136 mEq/L (ref 135–145)

## 2017-09-13 MED ORDER — ATORVASTATIN CALCIUM 10 MG PO TABS: 10.0000 mg | ORAL_TABLET | Freq: Every day | ORAL | 1 refills | Status: DC

## 2017-09-13 MED ORDER — ESCITALOPRAM OXALATE 10 MG PO TABS: 10.0000 mg | ORAL_TABLET | Freq: Every day | ORAL | 1 refills | Status: DC

## 2017-09-13 NOTE — Progress Notes (Signed)
Wesley Hancock is a 46 y.o. male is here for follow up on Hypertension.  I acted as a Education administrator for [employer], PA-C Anselmo Pickler, LPN  History of Present Illness:   Chief Complaint  Patient presents with  . Follow-up  . Hypertension    Hypertension  This is a chronic problem. The problem has been gradually improving since onset. The problem is controlled. Associated symptoms include shortness of breath. Pertinent negatives include no blurred vision, headaches, palpitations or peripheral edema. Risk factors for coronary artery disease include dyslipidemia and stress. Past treatments include ACE inhibitors. The current treatment provides mild improvement. There are no compliance problems.    Patient was seen by Dr. Melvyn Novas on 09/08/2016 and was taken off of Lisinopril-HCTZ 2/2 cough and put on Telmistartan-HCTZ. Feels like his blood pressure has been well-controlled. Denies any symptoms on this new medication.  Last BMP checked 06/24/17.  Anxiety Patient's father passed away yesterday. He is having significant issues sleeping. He is currently on Lexapro 5 mg daily, would like to increase to 10 mg daily. He and his wife are moving to [place] later this week to help out with his mother and the funeral arrangements. He has already been offered a job in [place] and will be closer to biological son.   High Cholesterol Continues on 10 mg lipitor daily. Well-controlled. Last lipid panel was checked <3 months ago. He needs a refill today. Tries to eat as healthy of a diet as he possibly can. Denies myalgias.   Health Maintenance Due  Topic Date Due  . HIV Screening  04/02/1987    Past Medical History:  Diagnosis Date  . Anxiety   . Depression   . High cholesterol   . Hyperlipidemia   . Hypertension   . Nutcracker esophagus      Social History   Social History  . Marital status: Divorced    Spouse name: N/A  . Number of children: N/A  . Years of education: N/A   Occupational  History  . Not on file.   Social History Main Topics  . Smoking status: Former Research scientist (life sciences)  . Smokeless tobacco: Never Used  . Alcohol use 3.0 oz/week    5 Cans of beer per week  . Drug use: No  . Sexual activity: Yes   Other Topics Concern  . Not on file   Social History Narrative   Married (03/2016)   Lives in [place], [place] (Originally from [place] )   Some family in [place] live in [place].   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   Did play minor [place] baseball. ([place]   Likes to hunt, fish and plays in competitive soft ball league (slow pitch)   Interest includes trips to [employer] and collecting colorful socks.       Past Surgical History:  Procedure Laterality Date  . esophageal spasms     Was seen by GI doctor -- seen at [facility] for this    Family History  Problem Relation Age of Onset  . Diabetes Mother   . Hyperlipidemia Mother   . Hypertension Mother   . Heart disease Father 49  . Hyperlipidemia Father   . Hypertension Father   . Prostate cancer Father 41  . Lung cancer Father 54       Dx 06/18/2017  . Diabetes Sister   . Hyperlipidemia Sister   . Hypertension Sister   . Diabetes  Maternal Grandmother   . Heart disease Maternal Grandmother   . Hyperlipidemia Maternal Grandmother   . Hypertension Maternal Grandmother   . Colon cancer Maternal Grandmother   . Heart disease Maternal Grandfather   . Hyperlipidemia Maternal Grandfather   . Hypertension Maternal Grandfather   . Stroke Maternal Grandfather   . Liver cancer Maternal Grandfather   . Heart disease Paternal Grandmother     PMHx, SurgHx, SocialHx, FamHx, Medications, and Allergies were reviewed in the Visit Navigator and updated as appropriate.   Patient Active Problem List   Diagnosis Date Noted  . Dyspnea on exertion 09/08/2017  . High cholesterol 06/24/2017  . Depression 06/24/2017  . GAD (generalized anxiety disorder) 08/06/2016  .  Essential hypertension 09/23/2014    Social History  Substance Use Topics  . Smoking status: Former Research scientist (life sciences)  . Smokeless tobacco: Never Used  . Alcohol use 3.0 oz/week    5 Cans of beer per week    Current Medications and Allergies:    Current Outpatient Prescriptions:  .  acetaminophen (TYLENOL) 500 MG tablet, Take 1,000 mg by mouth every 6 (six) hours as needed for mild pain., Disp: , Rfl:  .  aspirin EC 81 MG tablet, Take 81 mg by mouth daily., Disp: , Rfl:  .  atorvastatin (LIPITOR) 10 MG tablet, Take 1 tablet (10 mg total) by mouth daily., Disp: 90 tablet, Rfl: 1 .  diazepam (VALIUM) 5 MG tablet, Take 1 tablet (5 mg total) by mouth every 6 (six) hours as needed (spasm)., Disp: 10 tablet, Rfl: 0 .  diltiazem (CARDIZEM) 30 MG tablet, Take 1 tablet (30 mg total) by mouth 4 (four) times daily., Disp: 120 tablet, Rfl: 1 .  Fexofenadine-Pseudoephedrine (ALLEGRA-D 24 HOUR PO), Take 1 tablet by mouth daily., Disp: , Rfl:  .  nitroGLYCERIN (NITROSTAT) 0.4 MG SL tablet, Place 0.4 mg under the tongue every 5 (five) minutes as needed for chest pain., Disp: , Rfl:  .  Omega-3 Fatty Acids (FISH OIL) 1000 MG CAPS, Take 1 capsule by mouth 2 (two) times daily., Disp: , Rfl:  .  omeprazole (PRILOSEC) 20 MG capsule, Take 20 mg by mouth 2 (two) times daily before a meal., Disp: , Rfl:  .  telmisartan-hydrochlorothiazide (MICARDIS HCT) 80-25 MG tablet, Take 1 tablet by mouth daily., Disp: 90 tablet, Rfl: 11 .  escitalopram (LEXAPRO) 10 MG tablet, Take 1 tablet (10 mg total) by mouth daily., Disp: 90 tablet, Rfl: 1   Allergies  Allergen Reactions  . Diltiazem Hcl Diarrhea    Lethargic as well    Review of Systems   Review of Systems  Eyes: Negative for blurred vision.  Respiratory: Positive for shortness of breath.   Cardiovascular: Negative for palpitations.  Neurological: Negative for headaches.    Vitals:   Vitals:   09/13/17 0808  BP: 122/88  Pulse: 85  Temp: 98.7 F (37.1 C)    TempSrc: Oral  SpO2: 96%  Weight: 258 lb 8 oz (117.3 kg)  Height: 6\' 1"  (1.854 m)     Body mass index is 34.1 kg/m.   Physical Exam:    Physical Exam  Constitutional: He appears well-developed. He is cooperative.  Non-toxic appearance. He does not have a sickly appearance. He does not appear ill. No distress.  Cardiovascular: Normal rate, regular rhythm, S1 normal, S2 normal, normal heart sounds and normal pulses.   No LE edema  Pulmonary/Chest: Effort normal and breath sounds normal.  Neurological: He is alert. GCS eye subscore is  4. GCS verbal subscore is 5. GCS motor subscore is 6.  Skin: Skin is warm, dry and intact.  Psychiatric: He has a normal mood and affect. His speech is normal and behavior is normal.  Nursing note and vitals reviewed.    Assessment and Plan:    Wesley Hancock was seen today for follow-up and hypertension.  Diagnoses and all orders for this visit:  Essential hypertension Continue Micardis. Check BMP today. Blood pressures currently well controlled. He does have an appointment with cardiology at the end of the month for this. Follow-up in 6 months. -     Basic metabolic panel  Encounter for screening for HIV -     HIV antibody  High cholesterol Continue 10 mg Lisinopril. Lipid panel has been checked in <3 months. Follow-up in 6 months.  Refill today.  GAD (generalized anxiety disorder) Provided emotional support. Increase Lexapro to 10 mg daily. Refill x 6 months. Follow-up in 6 months, sooner if needed.  Other orders -     escitalopram (LEXAPRO) 10 MG tablet; Take 1 tablet (10 mg total) by mouth daily. -     atorvastatin (LIPITOR) 10 MG tablet; Take 1 tablet (10 mg total) by mouth daily.    . Reviewed expectations re: course of current medical issues. . Discussed self-management of symptoms. . Outlined signs and symptoms indicating need for more acute intervention. . Patient verbalized understanding and all questions were answered. . See  orders for this visit as documented in the electronic medical record. . Patient received an After Visit Summary.  CMA or LPN served as scribe during this visit. History, Physical, and Plan performed by medical provider. Documentation and orders reviewed and attested to.  Inda Coke, PA-C [place], [place] 09/13/2017  Follow-up: No Follow-up on file.

## 2017-09-13 NOTE — Patient Instructions (Signed)
Please let us know if you need any additional refills of your medication.

## 2017-09-14 LAB — HIV ANTIBODY (ROUTINE TESTING W REFLEX): HIV 1&2 Ab, 4th Generation: NONREACTIVE

## 2017-09-21 NOTE — Progress Notes (Deleted)
Cardiology Office Note   Date:  09/21/2017   ID:  Tresten Pantoja, DOB 1970/12/22, MRN [id number]  PCP:  Inda Coke, PA  Cardiologist:   Minus Breeding, MD  Referring:  Inda Coke, PA  No chief complaint on file.    History of Present Illness: Wesley Hancock is a 46 y.o. male who is referred by Inda Coke, PA for evaluation and treatment of dyspnea.  ***    Past Medical History:  Diagnosis Date  . Anxiety   . Depression   . High cholesterol   . Hyperlipidemia   . Hypertension   . Nutcracker esophagus     Past Surgical History:  Procedure Laterality Date  . esophageal spasms     Was seen by GI doctor -- seen at [facility] for this     Current Outpatient Prescriptions  Medication Sig Dispense Refill  . acetaminophen (TYLENOL) 500 MG tablet Take 1,000 mg by mouth every 6 (six) hours as needed for mild pain.    Marland Kitchen aspirin EC 81 MG tablet Take 81 mg by mouth daily.    Marland Kitchen atorvastatin (LIPITOR) 10 MG tablet Take 1 tablet (10 mg total) by mouth daily. 90 tablet 1  . diazepam (VALIUM) 5 MG tablet Take 1 tablet (5 mg total) by mouth every 6 (six) hours as needed (spasm). 10 tablet 0  . diltiazem (CARDIZEM) 30 MG tablet Take 1 tablet (30 mg total) by mouth 4 (four) times daily. 120 tablet 1  . escitalopram (LEXAPRO) 10 MG tablet Take 1 tablet (10 mg total) by mouth daily. 90 tablet 1  . Fexofenadine-Pseudoephedrine (ALLEGRA-D 24 HOUR PO) Take 1 tablet by mouth daily.    . nitroGLYCERIN (NITROSTAT) 0.4 MG SL tablet Place 0.4 mg under the tongue every 5 (five) minutes as needed for chest pain.    . Omega-3 Fatty Acids (FISH OIL) 1000 MG CAPS Take 1 capsule by mouth 2 (two) times daily.    Marland Kitchen omeprazole (PRILOSEC) 20 MG capsule Take 20 mg by mouth 2 (two) times daily before a meal.    . telmisartan-hydrochlorothiazide (MICARDIS HCT) 80-25 MG tablet Take 1 tablet by mouth daily. 90 tablet 11   No current facility-administered medications for this visit.      Allergies:   Diltiazem hcl    Social History:  The patient  reports that he has quit smoking. He has never used smokeless tobacco. He reports that he drinks about 3.0 oz of alcohol per week . He reports that he does not use drugs.   Family History:  The patient's ***family history includes Colon cancer in his maternal grandmother; Diabetes in his maternal grandmother, mother, and sister; Heart disease in his maternal grandfather, maternal grandmother, and paternal grandmother; Heart disease (age of onset: 35) in his father; Hyperlipidemia in his father, maternal grandfather, maternal grandmother, mother, and sister; Hypertension in his father, maternal grandfather, maternal grandmother, mother, and sister; Liver cancer in his maternal grandfather; Lung cancer (age of onset: 57) in his father; Prostate cancer (age of onset: 30) in his father; Stroke in his maternal grandfather.    ROS:  Please see the history of present illness.   Otherwise, review of systems are positive for {NONE DEFAULTED:18576::"none"}.   All other systems are reviewed and negative.    PHYSICAL EXAM: VS:  There were no vitals taken for this visit. , BMI There is no height or weight on file to calculate BMI. GENERAL:  Well appearing HEENT:  Pupils equal round and reactive, fundi  not visualized, oral mucosa unremarkable NECK:  No jugular venous distention, waveform within normal limits, carotid upstroke brisk and symmetric, no bruits, no thyromegaly LYMPHATICS:  No cervical, inguinal adenopathy LUNGS:  Clear to auscultation bilaterally BACK:  No CVA tenderness CHEST:  Unremarkable HEART:  PMI not displaced or sustained,S1 and S2 within normal limits, no S3, no S4, no clicks, no rubs, *** murmurs ABD:  Flat, positive bowel sounds normal in frequency in pitch, no bruits, no rebound, no guarding, no midline pulsatile mass, no hepatomegaly, no splenomegaly EXT:  2 plus pulses throughout, no edema, no cyanosis no  clubbing SKIN:  No rashes no nodules NEURO:  Cranial nerves II through XII grossly intact, motor grossly intact throughout PSYCH:  Cognitively intact, oriented to person place and time    EKG:  EKG {ACTION; IS/IS GYB:[id number]} ordered today. The ekg ordered today demonstrates ***   Recent Labs: 06/21/2017: ALT 47; Hemoglobin 16.4; Platelets 276 09/13/2017: BUN 19; Creatinine, Ser 1.09; Potassium 4.0; Sodium 136    Lipid Panel    Component Value Date/Time   CHOL 186 06/24/2017 1417   TRIG 119.0 06/24/2017 1417   HDL 42.40 06/24/2017 1417   CHOLHDL 4 06/24/2017 1417   VLDL 23.8 06/24/2017 1417   LDLCALC 119 (H) 06/24/2017 1417      Wt Readings from Last 3 Encounters:  09/13/17 258 lb 8 oz (117.3 kg)  09/08/17 254 lb 2 oz (115.3 kg)  08/19/17 253 lb (114.8 kg)      Other studies Reviewed: Additional studies/ records that were reviewed today include: ***. Review of the above records demonstrates:  Please see elsewhere in the note.  ***   ASSESSMENT AND PLAN:  *** DYSPNEA:  ***   HTN:  ***  DYSLIPIDEMIA:  ***  Current medicines are reviewed at length with the patient today.  The patient {ACTIONS; HAS/DOES NOT HAVE:19233} concerns regarding medicines.  The following changes have been made:  {PLAN; NO CHANGE:13088:s}  Labs/ tests ordered today include: *** No orders of the defined types were placed in this encounter.    Disposition:   FU with ***    Signed, Minus Breeding, MD  09/21/2017 9:08 PM     [facility]

## 2017-09-22 ENCOUNTER — Ambulatory Visit: Payer: Managed Care, Other (non HMO) | Admitting: Cardiology

## 2017-09-23 ENCOUNTER — Encounter: Payer: Self-pay | Admitting: *Deleted

## 2018-03-07 ENCOUNTER — Other Ambulatory Visit: Payer: Self-pay | Admitting: Physician Assistant

## 2018-03-07 MED ORDER — NITROGLYCERIN 0.4 MG SL SUBL: 0.4000 mg | SUBLINGUAL_TABLET | SUBLINGUAL | 2 refills | Status: DC | PRN

## 2018-03-07 MED ORDER — ATORVASTATIN CALCIUM 10 MG PO TABS: 10.0000 mg | ORAL_TABLET | Freq: Every day | ORAL | 0 refills | Status: DC

## 2018-03-07 NOTE — Telephone Encounter (Signed)
Nitrostat and Lipitor refill requests.  LOV 09/13/17 with Inda Coke.   Per the notes from 10/15 he was moving to [place] later that week.  [place]acy is [place].  [address]  [address],  [place], [place] [address].    [phone]

## 2018-03-07 NOTE — Telephone Encounter (Signed)
Spoke to pt, told him I sent Rx refills to pharmacy as requested. Asked pt if having to use Nitrostat and if found a new PCP. Pt verbalized understanding and said he has not found new PCP they are coming down in a few weeks and will make appt to see [facility]. Pt also said his bottle of Nitrostat expired and wanted to have on hand just in case. Told pt okay looking forward to seeing you.

## 2018-03-07 NOTE — Telephone Encounter (Signed)
Copied from [place]. Topic: Quick Communication - Rx Refill/Question >> Mar 07, 2018  9:29 AM Clack, Janett Billow D wrote: Medication:  nitroGLYCERIN (NITROSTAT) 0.4 MG SL tablet [[id number]] (pt states Morene Rankins has seen him for this issue) and atorvastatin (LIPITOR) 10 MG tablet [[id number]]  Has the patient contacted their pharmacy? No. (new pharmacy) (Agent: If no, request that the patient contact the pharmacy for the refill.) Preferred Pharmacy (with phone number or street name): [facility], [address] [place], [place] [address] [phone] Agent: Please be advised that RX refills may take up to 3 business days. We ask that you follow-up with your pharmacy.

## 2018-03-07 NOTE — Telephone Encounter (Signed)
See note

## 2018-03-07 NOTE — Addendum Note (Signed)
Addended by: Marian Sorrow on: 03/07/2018 02:27 PM   Modules accepted: Orders

## 2018-03-14 ENCOUNTER — Telehealth: Payer: Self-pay | Admitting: Physician Assistant

## 2018-03-14 DIAGNOSIS — I10 Essential (primary) hypertension: Secondary | ICD-10-CM

## 2018-03-14 NOTE — Telephone Encounter (Signed)
Pt requesting refill of Lexapro and Telmisartan-Hydrochlorothiazide(Micaris Hct) which was previously prescribed by another provider. Per encounter on 4/8 pt has not moved to [place] and has not found a new PCP.  LOV: 09/13/17  PCP: Inda Coke.  [address]     [place] [address]

## 2018-03-14 NOTE — Telephone Encounter (Signed)
Copied from [place] [id number]. Topic: Quick Communication - Rx Refill/Question >> Mar 14, 2018  1:48 PM Waylan Rocher, Lumin L wrote: Medication: Lexapro, telmisartan-hydrochlorothiazide (MICARDIS HCT) 80-25 MG tablet (out of telmisartan scripts) Has the patient contacted their pharmacy? No. (new pharm, out of town) (Agent: If no, request that the patient contact the pharmacy for the refill.) Preferred Pharmacy (with phone number or street name): [facility] [address] Phone: [phone] Fax: [phone] Agent: Please be advised that RX refills may take up to 3 business days. We ask that you follow-up with your pharmacy.  Patient is requesting a callback from Sam about giving him a new script for telmisartan which was prescribed by other provider.

## 2018-03-15 MED ORDER — ESCITALOPRAM OXALATE 10 MG PO TABS: 10.0000 mg | ORAL_TABLET | Freq: Every day | ORAL | 0 refills | Status: DC

## 2018-03-15 NOTE — Telephone Encounter (Signed)
LM for patient to return call.  CRM created.

## 2018-03-15 NOTE — Telephone Encounter (Signed)
Please advise.  Telmisartan not previously prescribed by you.

## 2018-03-15 NOTE — Telephone Encounter (Signed)
Please call patient and let him know I will refill his lexapro for 3 months.  His blood pressure medication looks like it has several refills, needs to contact pharmacy.  Inda Coke 03/15/2018

## 2019-02-21 ENCOUNTER — Other Ambulatory Visit: Payer: Self-pay

## 2019-02-21 ENCOUNTER — Encounter: Payer: Self-pay | Admitting: Physician Assistant

## 2019-02-21 ENCOUNTER — Ambulatory Visit (INDEPENDENT_AMBULATORY_CARE_PROVIDER_SITE_OTHER): Payer: 59 | Admitting: Physician Assistant

## 2019-02-21 VITALS — BP 127/80 | Ht 73.0 in | Wt 278.0 lb

## 2019-02-21 DIAGNOSIS — F411 Generalized anxiety disorder: Secondary | ICD-10-CM | POA: Diagnosis not present

## 2019-02-21 DIAGNOSIS — E7800 Pure hypercholesterolemia, unspecified: Secondary | ICD-10-CM | POA: Diagnosis not present

## 2019-02-21 DIAGNOSIS — I10 Essential (primary) hypertension: Secondary | ICD-10-CM

## 2019-02-21 MED ORDER — TELMISARTAN-HCTZ 80-25 MG PO TABS
1.0000 | ORAL_TABLET | Freq: Every day | ORAL | 1 refills | Status: DC
Start: 2019-02-21 — End: 2019-02-28

## 2019-02-21 MED ORDER — ESCITALOPRAM OXALATE 10 MG PO TABS: 10.0000 mg | ORAL_TABLET | Freq: Every day | ORAL | 1 refills | Status: DC

## 2019-02-21 MED ORDER — ATORVASTATIN CALCIUM 10 MG PO TABS: 10.0000 mg | ORAL_TABLET | Freq: Every day | ORAL | 1 refills | Status: DC

## 2019-02-21 NOTE — Progress Notes (Signed)
Virtual Visit via Video   I connected with Wesley Hancock on 02/21/19 at  1:00 PM EDT by a video enabled telemedicine application and verified that I am speaking with the correct person using two identifiers. Location patient: Home Location provider: [facility], Office Persons participating in the virtual visit: Inda Coke, PA-C, Anselmo Pickler, LPN, and Wesley Hancock.  I discussed the limitations of evaluation and management by telemedicine and the availability of in person appointments. The patient expressed understanding and agreed to proceed.  Subjective:   HPI:  Hypertension  Pt following up on blood pressure. Has recently moved back to [place], was in [place]. Pt needs refills on medications, currently taking Micardis HCT 80-25. 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  Pt denies headaches, dizziness, chest pain and SOB or blurred vision.  Anxiety and Depression Pt is doing well, starting new job as a Engineer, drilling. Pt tolerating Lexapro. Denies concerns.  ROS: See pertinent positives and negatives per HPI.  Patient Active Problem List   Diagnosis Date Noted  . Dyspnea on exertion 09/08/2017  . High cholesterol 06/24/2017  . Depression 06/24/2017  . GAD (generalized anxiety disorder) 08/06/2016  . Essential hypertension 09/23/2014    Social History   Tobacco Use  . Smoking status: Former Research scientist (life sciences)  . Smokeless tobacco: Never Used  Substance Use Topics  . Alcohol use: Yes    Alcohol/week: 5.0 standard drinks    Types: 5 Cans of beer per week    Current Outpatient Medications:  .  acetaminophen (TYLENOL) 500 MG tablet, Take 1,000 mg by mouth every 6 (six) hours as needed for mild pain., Disp: , Rfl:  .  aspirin EC 81 MG tablet, Take 81 mg by mouth daily., Disp: , Rfl:  .  atorvastatin (LIPITOR) 10 MG tablet, Take 1 tablet (10 mg total) by mouth daily., Disp: 90 tablet, Rfl: 1 .  escitalopram (LEXAPRO) 10 MG tablet, Take 1 tablet (10 mg  total) by mouth daily., Disp: 90 tablet, Rfl: 1 .  fluticasone (FLONASE) 50 MCG/ACT nasal spray, Place 1 spray into both nostrils daily. , Disp: , Rfl:  .  nitroGLYCERIN (NITROSTAT) 0.4 MG SL tablet, Place 1 tablet (0.4 mg total) under the tongue every 5 (five) minutes as needed for chest pain., Disp: 15 tablet, Rfl: 2 .  Omega-3 Fatty Acids (FISH OIL) 1000 MG CAPS, Take 1 capsule by mouth 2 (two) times daily., Disp: , Rfl:  .  omeprazole (PRILOSEC) 20 MG capsule, Take 20 mg by mouth 2 (two) times daily before a meal., Disp: , Rfl:  .  telmisartan-hydrochlorothiazide (MICARDIS HCT) 80-25 MG tablet, Take 1 tablet by mouth daily., Disp: 90 tablet, Rfl: 1 .  Fexofenadine-Pseudoephedrine (ALLEGRA-D 24 HOUR PO), Take 1 tablet by mouth daily., Disp: , Rfl:   Allergies  Allergen Reactions  . Diltiazem Hcl Diarrhea    Lethargic as well    Objective:   VITALS: Per patient if applicable, see vitals. GENERAL: Alert, appears well and in no acute distress. HEENT: Atraumatic, conjunctiva clear, no obvious abnormalities on inspection of external nose and ears. NECK: Normal movements of the head and neck. CARDIOPULMONARY: No increased WOB. Speaking in clear sentences. I:E ratio WNL.  MS: Moves all visible extremities without noticeable abnormality. PSYCH: Pleasant and cooperative, well-groomed. Speech normal rate and rhythm. Affect is appropriate. Insight and judgement are appropriate. Attention is focused, linear, and appropriate.  NEURO: CN grossly intact. Oriented as arrived to appointment on time  with no prompting. Moves both UE equally.  SKIN: No obvious lesions, wounds, erythema, or cyanosis noted on face or hands.  Assessment and Plan:   Ronte was seen today for hypertension.  Diagnoses and all orders for this visit:  Essential hypertension Well controlled, refill current medication. Follow-up in 6 months. -     Basic metabolic panel; Future -     Lipid panel; Future -      telmisartan-hydrochlorothiazide (MICARDIS HCT) 80-25 MG tablet; Take 1 tablet by mouth daily.  High cholesterol Lipid panel scheduled for future visit. Refill lipitor today. -     Basic metabolic panel; Future -     Lipid panel; Future  Anxiety Currently well controlled, refill Lexapro today.  Other orders -     atorvastatin (LIPITOR) 10 MG tablet; Take 1 tablet (10 mg total) by mouth daily. -     escitalopram (LEXAPRO) 10 MG tablet; Take 1 tablet (10 mg total) by mouth daily.    . Reviewed expectations re: course of current medical issues. . Discussed self-management of symptoms. . Outlined signs and symptoms indicating need for more acute intervention. . Patient verbalized understanding and all questions were answered. Marland Kitchen Health Maintenance issues including appropriate healthy diet, exercise, and smoking avoidance were discussed with patient. . See orders for this visit as documented in the electronic medical record.  CMA or LPN served as scribe during this visit. History, Physical, and Plan performed by medical provider. The above documentation has been reviewed and is accurate and complete.   [place], [place] 02/21/2019

## 2019-02-28 ENCOUNTER — Ambulatory Visit (INDEPENDENT_AMBULATORY_CARE_PROVIDER_SITE_OTHER): Payer: 59 | Admitting: *Deleted

## 2019-02-28 ENCOUNTER — Other Ambulatory Visit (INDEPENDENT_AMBULATORY_CARE_PROVIDER_SITE_OTHER): Payer: 59

## 2019-02-28 ENCOUNTER — Other Ambulatory Visit: Payer: Self-pay

## 2019-02-28 ENCOUNTER — Encounter: Payer: Self-pay | Admitting: *Deleted

## 2019-02-28 ENCOUNTER — Telehealth: Payer: Self-pay | Admitting: Physician Assistant

## 2019-02-28 DIAGNOSIS — I10 Essential (primary) hypertension: Secondary | ICD-10-CM | POA: Diagnosis not present

## 2019-02-28 DIAGNOSIS — Z23 Encounter for immunization: Secondary | ICD-10-CM | POA: Diagnosis not present

## 2019-02-28 DIAGNOSIS — E7800 Pure hypercholesterolemia, unspecified: Secondary | ICD-10-CM | POA: Diagnosis not present

## 2019-02-28 LAB — LIPID PANEL
Cholesterol: 193 mg/dL (ref 0–200)
HDL: 39.5 mg/dL (ref >39.00)
LDL Cholesterol: 128 mg/dL — ABNORMAL HIGH (ref 0–99)
NonHDL: 153.72
Total CHOL/HDL Ratio: 5
Triglycerides: 127 mg/dL (ref 0.0–149.0)
VLDL: 25.4 mg/dL (ref 0.0–40.0)

## 2019-02-28 LAB — BASIC METABOLIC PANEL
BUN: 21 mg/dL (ref 6–23)
CO2: 29 mEq/L (ref 19–32)
Calcium: 9.6 mg/dL (ref 8.4–10.5)
Chloride: 100 mEq/L (ref 96–112)
Creatinine, Ser: 1.14 mg/dL (ref 0.40–1.50)
GFR: 68.88 mL/min (ref >60.00)
Glucose, Bld: 113 mg/dL — ABNORMAL HIGH (ref 70–99)
Potassium: 3.7 mEq/L (ref 3.5–5.1)
Sodium: 138 mEq/L (ref 135–145)

## 2019-02-28 MED ORDER — TELMISARTAN-HCTZ 80-25 MG PO TABS: 1.0000 | ORAL_TABLET | Freq: Every day | ORAL | 5 refills | Status: DC

## 2019-02-28 MED ORDER — ATORVASTATIN CALCIUM 10 MG PO TABS: 10.0000 mg | ORAL_TABLET | Freq: Every day | ORAL | 5 refills | Status: DC

## 2019-02-28 MED ORDER — ESCITALOPRAM OXALATE 10 MG PO TABS: 10.0000 mg | ORAL_TABLET | Freq: Every day | ORAL | 5 refills | Status: DC

## 2019-02-28 NOTE — Telephone Encounter (Signed)
Rx refills sent to pharmacy as requested.

## 2019-02-28 NOTE — Telephone Encounter (Signed)
MEDICATION: telmisartan-hydrochlorothiazide (MICARDIS HCT) 80-25 MG tablet  escitalopram (LEXAPRO) 10 MG tablet  atorvastatin (LIPITOR) 10 MG tablet   PHARMACY:  Patient Needs pharmacy for ALL medications to be updated to [facility] [address].   IS THIS A 90 DAY SUPPLY : 30 day supply   IS PATIENT OUT OF MEDICATION: YES  IF NOT; HOW MUCH IS LEFT:   LAST APPOINTMENT DATE: @3 /24/2020  NEXT APPOINTMENT DATE:@3 /31/2020  OTHER COMMENTS:    **Let patient know to contact pharmacy at the end of the day to make sure medication is ready. **  ** Please notify patient to allow 48-72 hours to process**  **Encourage patient to contact the pharmacy for refills or they can request refills through [facility]**

## 2019-02-28 NOTE — Progress Notes (Signed)
Per orders of Inda Coke, [place], injection of Tdap vaccine 0.5 ml given in Right Deltoid IM by Anselmo Pickler, LPN. Patient tolerated injection well.

## 2019-02-28 NOTE — Addendum Note (Signed)
Addended by: Marian Sorrow on: 02/28/2019 09:59 AM   Modules accepted: Orders

## 2019-06-17 ENCOUNTER — Encounter: Payer: Self-pay | Admitting: Physician Assistant

## 2019-06-17 DIAGNOSIS — I10 Essential (primary) hypertension: Secondary | ICD-10-CM

## 2019-06-19 MED ORDER — ATORVASTATIN CALCIUM 10 MG PO TABS: 10.0000 mg | ORAL_TABLET | Freq: Every day | ORAL | 5 refills | Status: DC

## 2019-06-19 MED ORDER — ESCITALOPRAM OXALATE 10 MG PO TABS: 10.0000 mg | ORAL_TABLET | Freq: Every day | ORAL | 5 refills | Status: DC

## 2019-06-19 MED ORDER — TELMISARTAN-HCTZ 80-25 MG PO TABS: 1.0000 | ORAL_TABLET | Freq: Every day | ORAL | 5 refills | Status: DC

## 2019-06-20 ENCOUNTER — Encounter: Payer: Self-pay | Admitting: Physician Assistant

## 2019-06-20 ENCOUNTER — Emergency Department (HOSPITAL_COMMUNITY): Payer: BC Managed Care – PPO

## 2019-06-20 ENCOUNTER — Other Ambulatory Visit: Payer: Self-pay

## 2019-06-20 ENCOUNTER — Ambulatory Visit (INDEPENDENT_AMBULATORY_CARE_PROVIDER_SITE_OTHER): Payer: BC Managed Care – PPO | Admitting: Physician Assistant

## 2019-06-20 ENCOUNTER — Emergency Department (HOSPITAL_COMMUNITY)
Admission: EM | Admit: 2019-06-20 | Discharge: 2019-06-20 | Disposition: A | Discharge status: Home / Self Care | Payer: BC Managed Care – PPO | Attending: Emergency Medicine | Admitting: Emergency Medicine

## 2019-06-20 ENCOUNTER — Encounter (HOSPITAL_COMMUNITY): Payer: Self-pay

## 2019-06-20 ENCOUNTER — Ambulatory Visit: Payer: Self-pay | Admitting: Physician Assistant

## 2019-06-20 VITALS — Temp 98.8°F

## 2019-06-20 DIAGNOSIS — R51 Headache: Secondary | ICD-10-CM

## 2019-06-20 DIAGNOSIS — R197 Diarrhea, unspecified: Secondary | ICD-10-CM | POA: Diagnosis not present

## 2019-06-20 DIAGNOSIS — Z87891 Personal history of nicotine dependence: Secondary | ICD-10-CM | POA: Diagnosis not present

## 2019-06-20 DIAGNOSIS — R0602 Shortness of breath: Secondary | ICD-10-CM

## 2019-06-20 DIAGNOSIS — R0600 Dyspnea, unspecified: Secondary | ICD-10-CM | POA: Diagnosis not present

## 2019-06-20 DIAGNOSIS — Z79899 Other long term (current) drug therapy: Secondary | ICD-10-CM | POA: Diagnosis not present

## 2019-06-20 DIAGNOSIS — R0789 Other chest pain: Secondary | ICD-10-CM | POA: Insufficient documentation

## 2019-06-20 DIAGNOSIS — R0981 Nasal congestion: Secondary | ICD-10-CM | POA: Diagnosis not present

## 2019-06-20 DIAGNOSIS — R042 Hemoptysis: Secondary | ICD-10-CM | POA: Insufficient documentation

## 2019-06-20 DIAGNOSIS — R05 Cough: Secondary | ICD-10-CM

## 2019-06-20 DIAGNOSIS — M7918 Myalgia, other site: Secondary | ICD-10-CM | POA: Insufficient documentation

## 2019-06-20 DIAGNOSIS — R079 Chest pain, unspecified: Secondary | ICD-10-CM

## 2019-06-20 DIAGNOSIS — R5383 Other fatigue: Secondary | ICD-10-CM

## 2019-06-20 DIAGNOSIS — Z7189 Other specified counseling: Secondary | ICD-10-CM

## 2019-06-20 DIAGNOSIS — M791 Myalgia, unspecified site: Secondary | ICD-10-CM

## 2019-06-20 DIAGNOSIS — Z20822 Contact with and (suspected) exposure to covid-19: Secondary | ICD-10-CM

## 2019-06-20 DIAGNOSIS — Z20828 Contact with and (suspected) exposure to other viral communicable diseases: Secondary | ICD-10-CM

## 2019-06-20 LAB — BASIC METABOLIC PANEL
Anion gap: 8 (ref 5–15)
BUN: 15 mg/dL (ref 6–20)
CO2: 28 mmol/L (ref 22–32)
Calcium: 9.7 mg/dL (ref 8.9–10.3)
Chloride: 103 mmol/L (ref 98–111)
Creatinine, Ser: 1.13 mg/dL (ref 0.61–1.24)
GFR calc Af Amer: >60 mL/min (ref >60)
GFR calc non Af Amer: >60 mL/min (ref >60)
Glucose, Bld: 92 mg/dL (ref 70–99)
Potassium: 4.1 mmol/L (ref 3.5–5.1)
Sodium: 139 mmol/L (ref 135–145)

## 2019-06-20 LAB — CBC WITH DIFFERENTIAL/PLATELET
Abs Immature Granulocytes: 0.06 10*3/uL (ref 0.00–0.07)
Basophils Absolute: 0 10*3/uL (ref 0.0–0.1)
Basophils Relative: 1 %
Eosinophils Absolute: 0.2 10*3/uL (ref 0.0–0.5)
Eosinophils Relative: 2 %
HCT: 46.7 % (ref 39.0–52.0)
Hemoglobin: 15.1 g/dL (ref 13.0–17.0)
Immature Granulocytes: 1 %
Lymphocytes Relative: 15 %
Lymphs Abs: 1.2 10*3/uL (ref 0.7–4.0)
MCH: 30.1 pg (ref 26.0–34.0)
MCHC: 32.3 g/dL (ref 30.0–36.0)
MCV: 93.2 fL (ref 80.0–100.0)
Monocytes Absolute: 0.6 10*3/uL (ref 0.1–1.0)
Monocytes Relative: 7 %
Neutro Abs: 5.9 10*3/uL (ref 1.7–7.7)
Neutrophils Relative %: 74 %
Platelets: 260 10*3/uL (ref 150–400)
RBC: 5.01 MIL/uL (ref 4.22–5.81)
RDW: 12.3 % (ref 11.5–15.5)
WBC: 8 10*3/uL (ref 4.0–10.5)
nRBC: 0 % (ref 0.0–0.2)

## 2019-06-20 MED ORDER — AZITHROMYCIN 250 MG PO TABS: ORAL_TABLET | ORAL | 0 refills | Status: DC

## 2019-06-20 MED ORDER — ALBUTEROL SULFATE HFA 108 (90 BASE) MCG/ACT IN AERS: 1.0000 | INHALATION_SPRAY | Freq: Four times a day (QID) | RESPIRATORY_TRACT | 0 refills | Status: DC | PRN

## 2019-06-20 NOTE — Discharge Instructions (Signed)
I suspect you have a virus. It is also possible you could have other viral upper respiratory infection from another virus.  Viral upper infection could have led to bronchitis, bronchospasms that can cause blood tinged sputum, chest tightness, shortness of breath.   Follow up on your pending COVID tests.   Treatment of your illness and symptoms will include self-isolation, monitoring of symptoms and supportive care with over-the-counter medicines.    Return to the ED if there is increased work of breathing, shortness of breath, inability to tolerate fluids, weakness, chest pain.  If your test results are POSITIVE, the following isolation requirements need to be met to return to work and resume essential activities: At least 14 days since symptom onset  72 hours of absence of fever without antifever medicine (ibuprofen, acetaminophen). A fever is temperature of 100.75F or greater. Improvement of respiratory symptoms  If your test is NEGATIVE, you may return to work and essential activities as long as your symptoms have improved and you do not have a fever for a total of 3 days.  Call your job and notify them that your test result was negative to see if they will allow you to return to work.   Stay well-hydrated. Rest. You can use over the counter medications to help with symptoms: 600 mg ibuprofen (motrin, aleve, advil) or acetaminophen (tylenol) every 6 hours, around the clock to help with associated fevers, sore throat, headaches, generalized body aches and malaise.  Oxymetazoline (afrin) intranasal spray once daily for no more than 3 days to help with congestion, after 3 days you can switch to another over-the-counter nasal steroid spray such as fluticasone (flonase) Allergy medication (loratadine, cetirizine, etc) and phenylephrine (sudafed) help with nasal congestion, runny nose and postnasal drip.   Dextromethorphan (Delsym) to suppress dry cough. Frequent coughing is likely causing your chest  wall pain Wash your hands often to prevent spread.    Infection Prevention Recommendations for Individuals Confirmed to have, or Being Evaluated for, or have symptoms of 2019 Novel Coronavirus (COVID-19) Infection Who Receive Care at Home  Individuals who are confirmed to have, or are being evaluated for, COVID-19 should follow the prevention steps below until a healthcare provider or local or state health department says they can return to normal activities.  Stay home except to get medical care You should restrict activities outside your home, except for getting medical care. Do not go to work, school, or public areas, and do not use public transportation or taxis.  Call ahead before visiting your doctor Before your medical appointment, call the healthcare provider and tell them that you have, or are being evaluated for, COVID-19 infection. This will help the healthcare providers office take steps to keep other people from getting infected. Ask your healthcare provider to call the local or state health department.  Monitor your symptoms Seek prompt medical attention if your illness is worsening (e.g., difficulty breathing). Before going to your medical appointment, call the healthcare provider and tell them that you have, or are being evaluated for, COVID-19 infection. Ask your healthcare provider to call the local or state health department.  Wear a facemask You should wear a facemask that covers your nose and mouth when you are in the same room with other people and when you visit a healthcare provider. People who live with or visit you should also wear a facemask while they are in the same room with you.  Separate yourself from other people in your home As much as  possible, you should stay in a different room from other people in your home. Also, you should use a separate bathroom, if available.  Avoid sharing household items You should not share dishes, drinking glasses, cups,  eating utensils, towels, bedding, or other items with other people in your home. After using these items, you should wash them thoroughly with soap and water.  Cover your coughs and sneezes Cover your mouth and nose with a tissue when you cough or sneeze, or you can cough or sneeze into your sleeve. Throw used tissues in a lined trash can, and immediately wash your hands with soap and water for at least 20 seconds or use an alcohol-based hand rub.  Wash your [employer] your hands often and thoroughly with soap and water for at least 20 seconds. You can use an alcohol-based hand sanitizer if soap and water are not available and if your hands are not visibly dirty. Avoid touching your eyes, nose, and mouth with unwashed hands.   Prevention Steps for Caregivers and Household Members of Individuals Confirmed to have, or Being Evaluated for, or have symptoms of 2019 Novel Coronavirus (COVID-19) Infection Being Cared for in the Home  If you live with, or provide care at home for, a person confirmed to have, or being evaluated for, COVID-19 infection please follow these guidelines to prevent infection:  Follow healthcare providers instructions Make sure that you understand and can help the patient follow any healthcare provider instructions for all care.  Provide for the patients basic needs You should help the patient with basic needs in the home and provide support for getting groceries, prescriptions, and other personal needs.  Monitor the patients symptoms If they are getting sicker, call his or her medical provider and tell them that the patient has, or is being evaluated for, COVID-19 infection. This will help the healthcare providers office take steps to keep other people from getting infected. Ask the healthcare provider to call the local or state health department.  Limit the number of people who have contact with the patient If possible, have only one caregiver for the  patient. Other household members should stay in another home or place of residence. If this is not possible, they should stay in another room, or be separated from the patient as much as possible. Use a separate bathroom, if available. Restrict visitors who do not have an essential need to be in the home.  Keep older adults, very young children, and other sick people away from the patient Keep older adults, very young children, and those who have compromised immune systems or chronic health conditions away from the patient. This includes people with chronic heart, lung, or kidney conditions, diabetes, and cancer.  Ensure good ventilation Make sure that shared spaces in the home have good air flow, such as from an air conditioner or an opened window, weather permitting.  Wash your hands often Wash your hands often and thoroughly with soap and water for at least 20 seconds. You can use an alcohol based hand sanitizer if soap and water are not available and if your hands are not visibly dirty. Avoid touching your eyes, nose, and mouth with unwashed hands. Use disposable paper towels to dry your hands. If not available, use dedicated cloth towels and replace them when they become wet.  Wear a facemask and gloves Wear a disposable facemask at all times in the room and gloves when you touch or have contact with the patients blood, body fluids, and/or  secretions or excretions, such as sweat, saliva, sputum, nasal mucus, vomit, urine, or feces.  Ensure the mask fits over your nose and mouth tightly, and do not touch it during use. Throw out disposable facemasks and gloves after using them. Do not reuse. Wash your hands immediately after removing your facemask and gloves. If your personal clothing becomes contaminated, carefully remove clothing and launder. Wash your hands after handling contaminated clothing. Place all used disposable facemasks, gloves, and other waste in a lined container before  disposing them with other household waste. Remove gloves and wash your hands immediately after handling these items.  Do not share dishes, glasses, or other household items with the patient Avoid sharing household items. You should not share dishes, drinking glasses, cups, eating utensils, towels, bedding, or other items with a patient who is confirmed to have, or being evaluated for, COVID-19 infection. After the person uses these items, you should wash them thoroughly with soap and water.  Wash laundry thoroughly Immediately remove and wash clothes or bedding that have blood, body fluids, and/or secretions or excretions, such as sweat, saliva, sputum, nasal mucus, vomit, urine, or feces, on them. Wear gloves when handling laundry from the patient. Read and follow directions on labels of laundry or clothing items and detergent. In general, wash and dry with the warmest temperatures recommended on the label.  Clean all areas the individual has used often Clean all touchable surfaces, such as counters, tabletops, doorknobs, bathroom fixtures, toilets, phones, keyboards, tablets, and bedside tables, every day. Also, clean any surfaces that may have blood, body fluids, and/or secretions or excretions on them. Wear gloves when cleaning surfaces the patient has come in contact with. Use a diluted bleach solution (e.g., dilute bleach with 1 part bleach and 10 parts water) or a household disinfectant with a label that says EPA-registered for coronaviruses. To make a bleach solution at home, add 1 tablespoon of bleach to 1 quart (4 cups) of water. For a larger supply, add  cup of bleach to 1 gallon (16 cups) of water. Read labels of cleaning products and follow recommendations provided on product labels. Labels contain instructions for safe and effective use of the cleaning product including precautions you should take when applying the product, such as wearing gloves or eye protection and making sure you  have good ventilation during use of the product. Remove gloves and wash hands immediately after cleaning.  Monitor yourself for signs and symptoms of illness Caregivers and household members are considered close contacts, should monitor their health, and will be asked to limit movement outside of the home to the extent possible. Follow the monitoring steps for close contacts listed on the symptom monitoring form.  ? If you have additional questions, contact your local health department or call the epidemiologist on call at [phone] (available 24/7). ? This guidance is subject to change. For the most up-to-date guidance from [facility], please refer to their website: [email or web address].pl

## 2019-06-20 NOTE — ED Notes (Signed)
On ambulation in room for 5 minutes, pt's O2 sats where 92-94% with a HR of 110-120.

## 2019-06-20 NOTE — Telephone Encounter (Signed)
Pt. Reports he started feeling bad Sunday - fatigue, body aches, headache, cough with occasional bloody mucus. No fever. Went to [facility] for testing yesterday. Would like a virtual visit with his PCP. Warm transfer to [facility] in the practice.  Answer Assessment - Initial Assessment Questions 1. COVID-19 DIAGNOSIS: "Who made your Coronavirus (COVID-19) diagnosis?" "Was it confirmed by a positive lab test?" If not diagnosed by a HCP, ask "Are there lots of cases (community spread) where you live?" (See public health department website, if unsure)     No results 2. ONSET: "When did the COVID-19 symptoms start?"      Started Sunday 3. WORST SYMPTOM: "What is your worst symptom?" (e.g., cough, fever, shortness of breath, muscle aches)     Body aches, headache, cough, fatigue 4. COUGH: "Do you have a cough?" If so, ask: "How bad is the cough?"       Yes - bloody mucus 5. FEVER: "Do you have a fever?" If so, ask: "What is your temperature, how was it measured, and when did it start?"     No 6. RESPIRATORY STATUS: "Describe your breathing?" (e.g., shortness of breath, wheezing, unable to speak)      Some wheezing 7. BETTER-SAME-WORSE: "Are you getting better, staying the same or getting worse compared to yesterday?"  If getting worse, ask, "In what way?"     Better 8. HIGH RISK DISEASE: "Do you have any chronic medical problems?" (e.g., asthma, heart or lung disease, weak immune system, etc.)     No 9. PREGNANCY: "Is there any chance you are pregnant?" "When was your last menstrual period?"     n/a 10. OTHER SYMPTOMS: "Do you have any other symptoms?"  (e.g., chills, fatigue, headache, loss of smell or taste, muscle pain, sore throat)       No  Protocols used: CORONAVIRUS (COVID-19) DIAGNOSED OR SUSPECTED-A-AH

## 2019-06-20 NOTE — ED Triage Notes (Signed)
Pt c/o of chills and body-aches on Sunday.  [facility].  Pt has been coughing up blood (mucus with some blood).  Pt had video-call with pcp today.  Pt's pcp recommended he come to the ED for examination.  Pt's cough began yesterday  Am, and pt reports SOB began yesterday evening.

## 2019-06-20 NOTE — Progress Notes (Signed)
Virtual Visit via Video   I connected with Wesley Hancock on 06/20/19 at 11:20 AM EDT by a video enabled telemedicine application and verified that I am speaking with the correct person using two identifiers. Location patient: Home Location provider: [facility], Office Persons participating in the virtual visit: One[place], Schoenberger PA-C.  I discussed the limitations of evaluation and management by telemedicine and the availability of in person appointments. The patient expressed understanding and agreed to proceed.  I acted as a Education administrator for [employer], PA-C [employer], LPN  Subjective:   HPI:   COVID symptoms Pt started to feel bad on Sunday, fatigue, body aches, headache, cough with occasional bloody mucus. Denies fever. [facility], results pending. Also with SOB on exertion.  His concern is that now he is having some chest pain. This is new today. Cough with bloody mucus is not improving. Has a burn in his chest when he coughs.  ROS: See pertinent positives and negatives per HPI.  Patient Active Problem List   Diagnosis Date Noted  . Dyspnea on exertion 09/08/2017  . High cholesterol 06/24/2017  . Depression 06/24/2017  . GAD (generalized anxiety disorder) 08/06/2016  . Essential hypertension 09/23/2014    Social History   Tobacco Use  . Smoking status: Former Research scientist (life sciences)  . Smokeless tobacco: Never Used  Substance Use Topics  . Alcohol use: Yes    Alcohol/week: 5.0 standard drinks    Types: 5 Cans of beer per week    Current Outpatient Medications:  .  acetaminophen (TYLENOL) 500 MG tablet, Take 1,000 mg by mouth every 6 (six) hours as needed for mild pain., Disp: , Rfl:  .  aspirin EC 81 MG tablet, Take 81 mg by mouth daily., Disp: , Rfl:  .  atorvastatin (LIPITOR) 10 MG tablet, Take 1 tablet (10 mg total) by mouth daily., Disp: 30 tablet, Rfl: 5 .  escitalopram (LEXAPRO) 10 MG tablet, Take 1 tablet (10  mg total) by mouth daily., Disp: 30 tablet, Rfl: 5 .  Fexofenadine-Pseudoephedrine (ALLEGRA-D 24 HOUR PO), Take 1 tablet by mouth daily., Disp: , Rfl:  .  fluticasone (FLONASE) 50 MCG/ACT nasal spray, Place 1 spray into both nostrils daily. , Disp: , Rfl:  .  nitroGLYCERIN (NITROSTAT) 0.4 MG SL tablet, Place 1 tablet (0.4 mg total) under the tongue every 5 (five) minutes as needed for chest pain., Disp: 15 tablet, Rfl: 2 .  Omega-3 Fatty Acids (FISH OIL) 1000 MG CAPS, Take 1 capsule by mouth 2 (two) times daily., Disp: , Rfl:  .  omeprazole (PRILOSEC) 20 MG capsule, Take 20 mg by mouth 2 (two) times daily before a meal., Disp: , Rfl:  .  telmisartan-hydrochlorothiazide (MICARDIS HCT) 80-25 MG tablet, Take 1 tablet by mouth daily., Disp: 30 tablet, Rfl: 5 .  azithromycin (ZITHROMAX) 250 MG tablet, Take two tablets on day 1, then one tablet daily x 4 days, Disp: 6 tablet, Rfl: 0  Allergies  Allergen Reactions  . Diltiazem Hcl Diarrhea    Lethargic as well    Objective:   VITALS: Per patient if applicable, see vitals. GENERAL: Alert, appears well and in no acute distress. HEENT: Atraumatic, conjunctiva clear, no obvious abnormalities on inspection of external nose and ears. NECK: Normal movements of the head and neck. CARDIOPULMONARY: No increased WOB. Speaking in clear sentences. I:E ratio WNL.  MS: Moves all visible extremities without noticeable abnormality. PSYCH: Pleasant and cooperative, well-groomed. Speech normal rate  and rhythm. Affect is appropriate. Insight and judgement are appropriate. Attention is focused, linear, and appropriate.  NEURO: CN grossly intact. Oriented as arrived to appointment on time with no prompting. Moves both UE equally.  SKIN: No obvious lesions, wounds, erythema, or cyanosis noted on face or hands.  Assessment and Plan:   Isahi was seen today for covid-19 symptoms.  Diagnoses and all orders for this visit:  Suspected Covid-19 Virus Infection; Advice  Given About Covid-19 Virus Infection; Chest pain, unspecified type Suspected COVID-19. Given onset of chest pain, DOE and ongoing bloody sputum, recommend evaluation in ER. I did send in azithromycin for him to trial, however, will defer to ER providers judgement.  . Reviewed expectations re: course of current medical issues. . Discussed self-management of symptoms. . Outlined signs and symptoms indicating need for more acute intervention. . Patient verbalized understanding and all questions were answered. Marland Kitchen Health Maintenance issues including appropriate healthy diet, exercise, and smoking avoidance were discussed with patient. . See orders for this visit as documented in the electronic medical record.  I discussed the assessment and treatment plan with the patient. The patient was provided an opportunity to ask questions and all were answered. The patient agreed with the plan and demonstrated an understanding of the instructions.   The patient was advised to call back or seek an in-person evaluation if the symptoms worsen or if the condition fails to improve as anticipated.   CMA or LPN served as scribe during this visit. History, Physical, and Plan performed by medical provider. The above documentation has been reviewed and is accurate and complete.   [place], [place] 06/20/2019

## 2019-06-20 NOTE — ED Provider Notes (Signed)
[facility] DEPT Provider Note   CSN: [id number] Arrival date & time: 06/20/19  1217    History   Chief Complaint Chief Complaint  Patient presents with  . Shortness of Breath  . Cough with blood  . Chest Pain    HPI Wesley Hancock is a 48 y.o. male with history of hypertension, hyperlipidemia, esophageal spasms presents to the ER for evaluation of productive cough with phlegm and streaks of blood in it.  Associated with body aches, worsening nasal congestion, postnasal drip, nonbloody diarrhea, chest tightness since Sunday.  This morning he had a telemedicine visit with his PCP and reported new exertional shortness of breath.  PCP advised he come to the ER for evaluation.  Patient describes shortness of breath as "I cannot take a full deep breath" but clarifies that he is not working harder to breathe, and usually SOB worse at the very end of a long walk.  He has no fever, sore throat, nausea, vomiting, abdominal pain.  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  2 weeks ago he got injured at work and was at [employer] states that there were a lot of people with cough that were waiting to get tested for COVID.  He has a 66-week-old daughter at home.  Daughter and wife are well without any symptoms.  No travel.     HPI  Past Medical History:  Diagnosis Date  . Anxiety   . Depression   . High cholesterol   . Hyperlipidemia   . Hypertension   . Nutcracker esophagus     Patient Active Problem List   Diagnosis Date Noted  . Dyspnea on exertion 09/08/2017  . High cholesterol 06/24/2017  . Depression 06/24/2017  . GAD (generalized anxiety disorder) 08/06/2016  . Essential hypertension 09/23/2014    Past Surgical History:  Procedure Laterality Date  . esophageal spasms     Was seen by GI doctor -- seen at [facility] for this        Home Medications    Prior to Admission medications    Medication Sig Start Date End Date Taking? Authorizing Provider  acetaminophen (TYLENOL) 500 MG tablet Take 1,000 mg by mouth every 6 (six) hours as needed for mild pain.   Yes [provider]  atorvastatin (LIPITOR) 10 MG tablet Take 1 tablet (10 mg total) by mouth daily. 06/19/19  Yes Inda Coke, PA  escitalopram (LEXAPRO) 10 MG tablet Take 1 tablet (10 mg total) by mouth daily. 06/19/19  Yes Worley, Aldona Bar, PA  fluticasone (FLONASE) 50 MCG/ACT nasal spray Place 1 spray into both nostrils daily.    Yes [provider]  nitroGLYCERIN (NITROSTAT) 0.4 MG SL tablet Place 1 tablet (0.4 mg total) under the tongue every 5 (five) minutes as needed for chest pain. 03/07/18  Yes Worley, Aldona Bar, PA  Omega-3 Fatty Acids (FISH OIL) 1000 MG CAPS Take 1 capsule by mouth 2 (two) times daily.   Yes [provider]  omeprazole (PRILOSEC) 20 MG capsule Take 20 mg by mouth 2 (two) times daily before a meal.   Yes [provider]  telmisartan-hydrochlorothiazide (MICARDIS HCT) 80-25 MG tablet Take 1 tablet by mouth daily. 06/19/19  Yes Inda Coke, PA  albuterol (VENTOLIN HFA) 108 (90 Base) MCG/ACT inhaler Inhale 1-2 puffs into the lungs every 6 (six) hours as needed for wheezing or shortness of breath. 06/20/19   Kinnie Feil, PA-C  azithromycin (ZITHROMAX) 250  MG tablet Take two tablets on day 1, then one tablet daily x 4 days 06/20/19   Inda Coke, PA    Family History Family History  Problem Relation Age of Onset  . Diabetes Mother   . Hyperlipidemia Mother   . Hypertension Mother   . Heart disease Father 52  . Hyperlipidemia Father   . Hypertension Father   . Prostate cancer Father 79  . Lung cancer Father 66       Dx 06/18/2017  . Diabetes Sister   . Hyperlipidemia Sister   . Hypertension Sister   . Diabetes Maternal Grandmother   . Heart disease Maternal Grandmother   . Hyperlipidemia Maternal Grandmother   . Hypertension Maternal Grandmother    . Colon cancer Maternal Grandmother   . Heart disease Maternal Grandfather   . Hyperlipidemia Maternal Grandfather   . Hypertension Maternal Grandfather   . Stroke Maternal Grandfather   . Liver cancer Maternal Grandfather   . Heart disease Paternal Grandmother     Social History Social History   Tobacco Use  . Smoking status: Former Research scientist (life sciences)  . Smokeless tobacco: Never Used  Substance Use Topics  . Alcohol use: Yes    Alcohol/week: 5.0 standard drinks    Types: 5 Cans of beer per week  . Drug use: No     Allergies   Diltiazem hcl   Review of Systems Review of Systems  HENT: Positive for congestion, postnasal drip and rhinorrhea.   Respiratory: Positive for cough, chest tightness and shortness of breath.   Gastrointestinal: Positive for diarrhea.  Musculoskeletal: Positive for myalgias.  All other systems reviewed and are negative.    Physical Exam Updated Vital Signs BP (!) 155/101 (BP Location: Right Arm)   Pulse (!) 108   Temp 99 F (37.2 C) (Oral)   Resp 18   Ht 6\' 1"  (1.854 m)   Wt 127 kg   SpO2 94%   BMI 36.94 kg/m   Physical Exam Vitals signs and nursing note reviewed.  Constitutional:      Appearance: He is well-developed.     Comments: Non toxic.  HENT:     Head: Normocephalic and atraumatic.     Nose: Nose normal.  Eyes:     Conjunctiva/sclera: Conjunctivae normal.  Neck:     Musculoskeletal: Normal range of motion.  Cardiovascular:     Rate and Rhythm: Normal rate and regular rhythm.     Heart sounds: Normal heart sounds.     Comments: No lower extremity edema or calf tenderness.  1+ radial and DP pulses bilaterally. Pulmonary:     Effort: Pulmonary effort is normal.     Breath sounds: Normal breath sounds.     Comments: Speaking in full sentences.  SPO2 greater than 95% during the 10 minutes I was speaking to patient.  Lungs are clear.  No wheezing, no crackles. Abdominal:     General: Bowel sounds are normal.     Palpations:  Abdomen is soft.     Tenderness: There is no abdominal tenderness.     Comments: No G/R/R. No suprapubic or CVA tenderness. Negative Murphy's and McBurney's  Musculoskeletal: Normal range of motion.  Skin:    General: Skin is warm and dry.     Capillary Refill: Capillary refill takes less than 2 seconds.  Neurological:     Mental Status: He is alert.  Psychiatric:        Behavior: Behavior normal.      ED Treatments /  Results  Labs (all labs ordered are listed, but only abnormal results are displayed) Labs Reviewed  CBC WITH DIFFERENTIAL/PLATELET  BASIC METABOLIC PANEL    EKG EKG Interpretation  Date/Time:  Tuesday June 20 2019 13:16:00 EDT Ventricular Rate:  91 PR Interval:    QRS Duration: 86 QT Interval:  351 QTC Calculation: 432 R Axis:   21 Text Interpretation:  Sinus rhythm Since last tracing rate slower Confirmed by Dorie Rank [phone]) on 06/20/2019 1:33:23 PM   Radiology Dg Chest Portable 1 View  Result Date: 06/20/2019 CLINICAL DATA:  Chills and body aches. Hemoptysis with cough that began yesterday. Some shortness-of-breath. Pending COVID-19 test. EXAM: PORTABLE CHEST 1 VIEW COMPARISON:  02/08/2016 FINDINGS: Lungs are adequately inflated and otherwise clear. Cardiomediastinal silhouette and remainder the exam is unchanged. IMPRESSION: No active disease. Electronically Signed   By: Marin Olp M.D.   On: 06/20/2019 13:47    Procedures Procedures (including critical care time)  Medications Ordered in ED Medications - No data to display   Initial Impression / Assessment and Plan / ED Course  I have reviewed the triage vital signs and the nursing notes.  Pertinent labs & imaging results that were available during my care of the patient were reviewed by me and considered in my medical decision making (see chart for details).  Clinical Course as of Jun 19 1521  Tue Jun 20, 2019  1410 IMPRESSION: No active disease.   DG Chest Portable 1 View [CG]     Clinical Course User Index [CG] Kinnie Feil, PA-C   I have reviewed patient's EMR to obtain pertinent PMH to assist in MDM.  I reviewed patient's recent PCP telemedicine visit.  Symptoms and exam most suggestive of uncomplicated viral illness. DDX incluldes viral URI/LRI, COVID-19.  He has had potential exposure to symptomatic patients.  Exam is benign.  Normal WOB. No fever.  Resolved tachycardia.  SPO2 has been greater than 94% here at rest.  He ambulated for 5 minutes and RN noticed a couple of seconds of SPO2 at 92% at the end when patient was talking however patient denies shortness of breath.  His heart rate did go up to 100 220, suspect this is from possibly low-grade fever, mild dehydration. Lungs are CTAB.   Lab work reassuring without leukocytosis.  Chest x-ray without opacities, pulmonary edema, PTX, cardiomegaly. Doubt bacterial bronchitis or pneumonia.  No signs or symptoms to suggest strep pharyngitis.  No clinical signs of severe illness, dehydration, to warrant further emergent work up in ER.   Given reassuring physical exam, symptoms, will discharge with symptomatic treatment.  Although his SPO2 dropped transiently to 92%, clinically he looks well and has no comorbidities to put him at risk for complications.  I explained this to patient and he felt comfortable with discharge.  He has a pulse oximeter at home and I instructed him to use this during exertion, instructed to return to the ER if this drops below 94% or he has increased shortness of breath, inability to tolerate fluids.  Pt was evaluated in the context of the global COVID-19 pandemic.  We discussed patient's overall low risk profile to develop complications.  He has a pending COVID test done on Monday and retesting today would not have changed management, patient is comfortable with this.  Recommended PCP f/u in the next 2-3 days for persistent symptoms  for further guidance. Self-isolation instructions discussed. Pt  was given home self-isolation instructions and instructions for family members. t understands signs  and symptoms that would warrant return to ED.  Pt comfortable and agreeable with POC.  Discussed with EDP.  Given chest tightness, blood tinged sputum suspect viral bronchitis vs COVID, will dc with albuterol, symptomatic management. He has no risks for bacterial bronchitis. PCP prescribed azithromycin, will defer this to PCP.  Final Clinical Impressions(s) / ED Diagnoses   Final diagnoses:  Suspected Covid-19 Virus Infection  Shortness of breath    ED Discharge Orders         Ordered    albuterol (VENTOLIN HFA) 108 (90 Base) MCG/ACT inhaler  Every 6 hours PRN     06/20/19 1521           Kinnie Feil, PA-C 06/20/19 1522    Dorie Rank, MD 06/23/19 [phone]

## 2019-07-17 ENCOUNTER — Other Ambulatory Visit: Payer: Self-pay | Admitting: Physician Assistant

## 2019-07-17 DIAGNOSIS — Z3009 Encounter for other general counseling and advice on contraception: Secondary | ICD-10-CM

## 2019-08-22 DIAGNOSIS — I2699 Other pulmonary embolism without acute cor pulmonale: Secondary | ICD-10-CM | POA: Diagnosis present

## 2019-08-22 HISTORY — DX: Other pulmonary embolism without acute cor pulmonale: I26.99

## 2019-08-29 MED ORDER — ACETAMINOPHEN 325 MG PO TABS
650.00 | ORAL_TABLET | ORAL | Status: DC
Start: ? — End: 2019-08-29

## 2019-08-29 MED ORDER — SODIUM CHLORIDE 0.9 % IV SOLN
10.00 | INTRAVENOUS | Status: DC
Start: ? — End: 2019-08-29

## 2019-08-29 MED ORDER — LIDOCAINE-MENTHOL 3.6-1.25 % EX PTCH
1.00 | MEDICATED_PATCH | CUTANEOUS | Status: DC
Start: 2019-08-28 — End: 2019-08-29

## 2019-08-29 MED ORDER — OXYCODONE HCL 5 MG PO TABS
5.00 | ORAL_TABLET | ORAL | Status: DC
Start: ? — End: 2019-08-29

## 2019-08-29 MED ORDER — HYDROMORPHONE HCL 1 MG/ML IJ SOLN
0.50 | INTRAMUSCULAR | Status: DC
Start: ? — End: 2019-08-29

## 2019-08-29 MED ORDER — PRAVASTATIN SODIUM 40 MG PO TABS
40.00 | ORAL_TABLET | ORAL | Status: DC
Start: 2019-08-27 — End: 2019-08-29

## 2019-08-29 MED ORDER — FLUTICASONE PROPIONATE 50 MCG/ACT NA SUSP
1.00 | NASAL | Status: DC
Start: 2019-08-28 — End: 2019-08-29

## 2019-08-29 MED ORDER — PREDNISONE 20 MG PO TABS
60.00 | ORAL_TABLET | ORAL | Status: DC
Start: ? — End: 2019-08-29

## 2019-08-29 MED ORDER — ESCITALOPRAM OXALATE 10 MG PO TABS
10.00 | ORAL_TABLET | ORAL | Status: DC
Start: 2019-08-28 — End: 2019-08-29

## 2019-08-29 MED ORDER — OXYCODONE HCL 5 MG PO TABS
10.00 | ORAL_TABLET | ORAL | Status: DC
Start: ? — End: 2019-08-29

## 2019-08-29 MED ORDER — GENERIC EXTERNAL MEDICATION
Status: DC
Start: ? — End: 2019-08-29

## 2019-08-29 MED ORDER — DOCUSATE SODIUM 100 MG PO CAPS
100.00 | ORAL_CAPSULE | ORAL | Status: DC
Start: 2019-08-28 — End: 2019-08-29

## 2019-08-29 MED ORDER — GENERIC EXTERNAL MEDICATION
2.00 | Status: DC
Start: 2019-08-27 — End: 2019-08-29

## 2019-08-29 MED ORDER — ALBUTEROL SULFATE (2.5 MG/3ML) 0.083% IN NEBU
2.50 | INHALATION_SOLUTION | RESPIRATORY_TRACT | Status: DC
Start: ? — End: 2019-08-29

## 2019-08-29 MED ORDER — RIVAROXABAN 15 MG PO TABS
15.00 | ORAL_TABLET | ORAL | Status: DC
Start: ? — End: 2019-08-29

## 2019-08-29 MED ORDER — GUAIFENESIN 400 MG PO TABS
400.00 | ORAL_TABLET | ORAL | Status: DC
Start: ? — End: 2019-08-29

## 2019-08-29 MED ORDER — PANTOPRAZOLE SODIUM 20 MG PO TBEC
20.00 | DELAYED_RELEASE_TABLET | ORAL | Status: DC
Start: 2019-08-28 — End: 2019-08-29

## 2019-08-29 MED ORDER — LACTATED RINGERS IV SOLN
75.00 | INTRAVENOUS | Status: DC
Start: ? — End: 2019-08-29

## 2019-08-29 MED ORDER — NITROGLYCERIN 0.4 MG SL SUBL
0.40 | SUBLINGUAL_TABLET | SUBLINGUAL | Status: DC
Start: ? — End: 2019-08-29

## 2019-08-30 ENCOUNTER — Other Ambulatory Visit: Payer: Self-pay

## 2019-08-30 ENCOUNTER — Inpatient Hospital Stay: Payer: 59 | Admitting: Physician Assistant

## 2019-08-30 ENCOUNTER — Emergency Department (HOSPITAL_COMMUNITY): Payer: BC Managed Care – PPO

## 2019-08-30 ENCOUNTER — Encounter (HOSPITAL_COMMUNITY): Payer: Self-pay

## 2019-08-30 ENCOUNTER — Inpatient Hospital Stay (HOSPITAL_COMMUNITY)
Admission: EM | Admit: 2019-08-30 | Discharge: 2019-09-02 | DRG: 193 | Disposition: A | Discharge status: Home / Self Care | Payer: BC Managed Care – PPO | Attending: Internal Medicine | Admitting: Internal Medicine

## 2019-08-30 ENCOUNTER — Ambulatory Visit: Payer: Self-pay | Admitting: *Deleted

## 2019-08-30 DIAGNOSIS — G4700 Insomnia, unspecified: Secondary | ICD-10-CM | POA: Diagnosis present

## 2019-08-30 DIAGNOSIS — Z20828 Contact with and (suspected) exposure to other viral communicable diseases: Secondary | ICD-10-CM | POA: Diagnosis present

## 2019-08-30 DIAGNOSIS — Z8249 Family history of ischemic heart disease and other diseases of the circulatory system: Secondary | ICD-10-CM

## 2019-08-30 DIAGNOSIS — K219 Gastro-esophageal reflux disease without esophagitis: Secondary | ICD-10-CM | POA: Diagnosis present

## 2019-08-30 DIAGNOSIS — Z801 Family history of malignant neoplasm of trachea, bronchus and lung: Secondary | ICD-10-CM

## 2019-08-30 DIAGNOSIS — Z8042 Family history of malignant neoplasm of prostate: Secondary | ICD-10-CM | POA: Diagnosis not present

## 2019-08-30 DIAGNOSIS — Z791 Long term (current) use of non-steroidal anti-inflammatories (NSAID): Secondary | ICD-10-CM | POA: Diagnosis not present

## 2019-08-30 DIAGNOSIS — I10 Essential (primary) hypertension: Secondary | ICD-10-CM | POA: Diagnosis present

## 2019-08-30 DIAGNOSIS — Z823 Family history of stroke: Secondary | ICD-10-CM

## 2019-08-30 DIAGNOSIS — Z800 Family history of malignant neoplasm of digestive organs: Secondary | ICD-10-CM | POA: Diagnosis not present

## 2019-08-30 DIAGNOSIS — Z792 Long term (current) use of antibiotics: Secondary | ICD-10-CM

## 2019-08-30 DIAGNOSIS — R197 Diarrhea, unspecified: Secondary | ICD-10-CM | POA: Diagnosis present

## 2019-08-30 DIAGNOSIS — Z7951 Long term (current) use of inhaled steroids: Secondary | ICD-10-CM | POA: Diagnosis not present

## 2019-08-30 DIAGNOSIS — Z8349 Family history of other endocrine, nutritional and metabolic diseases: Secondary | ICD-10-CM

## 2019-08-30 DIAGNOSIS — J181 Lobar pneumonia, unspecified organism: Principal | ICD-10-CM | POA: Diagnosis present

## 2019-08-30 DIAGNOSIS — J189 Pneumonia, unspecified organism: Secondary | ICD-10-CM | POA: Diagnosis not present

## 2019-08-30 DIAGNOSIS — I2699 Other pulmonary embolism without acute cor pulmonale: Secondary | ICD-10-CM | POA: Diagnosis present

## 2019-08-30 DIAGNOSIS — J9601 Acute respiratory failure with hypoxia: Secondary | ICD-10-CM | POA: Diagnosis present

## 2019-08-30 DIAGNOSIS — E876 Hypokalemia: Secondary | ICD-10-CM | POA: Diagnosis present

## 2019-08-30 DIAGNOSIS — Z9049 Acquired absence of other specified parts of digestive tract: Secondary | ICD-10-CM | POA: Diagnosis not present

## 2019-08-30 DIAGNOSIS — Z833 Family history of diabetes mellitus: Secondary | ICD-10-CM

## 2019-08-30 DIAGNOSIS — N179 Acute kidney failure, unspecified: Secondary | ICD-10-CM | POA: Diagnosis present

## 2019-08-30 DIAGNOSIS — E785 Hyperlipidemia, unspecified: Secondary | ICD-10-CM | POA: Diagnosis present

## 2019-08-30 DIAGNOSIS — Z7901 Long term (current) use of anticoagulants: Secondary | ICD-10-CM

## 2019-08-30 DIAGNOSIS — Z7952 Long term (current) use of systemic steroids: Secondary | ICD-10-CM

## 2019-08-30 DIAGNOSIS — R0602 Shortness of breath: Secondary | ICD-10-CM

## 2019-08-30 DIAGNOSIS — I82409 Acute embolism and thrombosis of unspecified deep veins of unspecified lower extremity: Secondary | ICD-10-CM | POA: Diagnosis present

## 2019-08-30 DIAGNOSIS — I2694 Multiple subsegmental pulmonary emboli without acute cor pulmonale: Secondary | ICD-10-CM

## 2019-08-30 DIAGNOSIS — Z79899 Other long term (current) drug therapy: Secondary | ICD-10-CM

## 2019-08-30 DIAGNOSIS — Z87891 Personal history of nicotine dependence: Secondary | ICD-10-CM

## 2019-08-30 DIAGNOSIS — F329 Major depressive disorder, single episode, unspecified: Secondary | ICD-10-CM | POA: Diagnosis present

## 2019-08-30 DIAGNOSIS — R778 Other specified abnormalities of plasma proteins: Secondary | ICD-10-CM

## 2019-08-30 HISTORY — DX: Acute respiratory failure with hypoxia: J96.01

## 2019-08-30 HISTORY — DX: Acute kidney failure, unspecified: N17.9

## 2019-08-30 HISTORY — DX: Acute embolism and thrombosis of unspecified deep veins of unspecified lower extremity: I82.409

## 2019-08-30 HISTORY — DX: Lobar pneumonia, unspecified organism: J18.1

## 2019-08-30 LAB — BASIC METABOLIC PANEL
Anion gap: 10 (ref 5–15)
BUN: 28 mg/dL — ABNORMAL HIGH (ref 6–20)
CO2: 25 mmol/L (ref 22–32)
Calcium: 8.5 mg/dL — ABNORMAL LOW (ref 8.9–10.3)
Chloride: 105 mmol/L (ref 98–111)
Creatinine, Ser: 1.42 mg/dL — ABNORMAL HIGH (ref 0.61–1.24)
GFR calc Af Amer: >60 mL/min (ref >60)
GFR calc non Af Amer: 58 mL/min — ABNORMAL LOW (ref >60)
Glucose, Bld: 104 mg/dL — ABNORMAL HIGH (ref 70–99)
Potassium: 3.6 mmol/L (ref 3.5–5.1)
Sodium: 140 mmol/L (ref 135–145)

## 2019-08-30 LAB — I-STAT CHEM 8, ED
BUN: 25 mg/dL — ABNORMAL HIGH (ref 6–20)
Calcium, Ion: 1.17 mmol/L (ref 1.15–1.40)
Chloride: 103 mmol/L (ref 98–111)
Creatinine, Ser: 1.3 mg/dL — ABNORMAL HIGH (ref 0.61–1.24)
Glucose, Bld: 97 mg/dL (ref 70–99)
HCT: 38 % — ABNORMAL LOW (ref 39.0–52.0)
Hemoglobin: 12.9 g/dL — ABNORMAL LOW (ref 13.0–17.0)
Potassium: 3.7 mmol/L (ref 3.5–5.1)
Sodium: 142 mmol/L (ref 135–145)
TCO2: 26 mmol/L (ref 22–32)

## 2019-08-30 LAB — CBC
HCT: 37.2 % — ABNORMAL LOW (ref 39.0–52.0)
Hemoglobin: 12.4 g/dL — ABNORMAL LOW (ref 13.0–17.0)
MCH: 30.8 pg (ref 26.0–34.0)
MCHC: 33.3 g/dL (ref 30.0–36.0)
MCV: 92.3 fL (ref 80.0–100.0)
Platelets: 203 10*3/uL (ref 150–400)
RBC: 4.03 MIL/uL — ABNORMAL LOW (ref 4.22–5.81)
RDW: 12.6 % (ref 11.5–15.5)
WBC: >14.7 10*3/uL — ABNORMAL HIGH (ref 4.0–10.5)
nRBC: 0 % (ref 0.0–0.2)

## 2019-08-30 LAB — TROPONIN I (HIGH SENSITIVITY)
Troponin I (High Sensitivity): 23 ng/L — ABNORMAL HIGH (ref <18)
Troponin I (High Sensitivity): 21 ng/L — ABNORMAL HIGH (ref <18)

## 2019-08-30 LAB — MRSA PCR SCREENING: MRSA by PCR: NEGATIVE

## 2019-08-30 LAB — EXPECTORATED SPUTUM ASSESSMENT W GRAM STAIN, RFLX TO RESP C

## 2019-08-30 LAB — SARS CORONAVIRUS 2 BY RT PCR (HOSPITAL ORDER, PERFORMED IN ~~LOC~~ HOSPITAL LAB): SARS Coronavirus 2: NEGATIVE

## 2019-08-30 LAB — BRAIN NATRIURETIC PEPTIDE: B Natriuretic Peptide: 530 pg/mL — ABNORMAL HIGH (ref 0.0–100.0)

## 2019-08-30 MED ORDER — IOHEXOL 350 MG/ML SOLN
80.0000 mL | Freq: Once | INTRAVENOUS | Status: AC | PRN
  Administered 2019-08-30: 80 mL via INTRAVENOUS

## 2019-08-30 MED ORDER — SODIUM CHLORIDE 0.9 % IV BOLUS
1000.0000 mL | Freq: Once | INTRAVENOUS | Status: AC
  Administered 2019-08-30: 1000 mL via INTRAVENOUS

## 2019-08-30 MED ORDER — SODIUM CHLORIDE 0.9 % IV SOLN
2.0000 g | INTRAVENOUS | Status: DC
  Administered 2019-08-31 – 2019-09-01 (×2): 2 g via INTRAVENOUS
  Filled 2019-08-30: qty 2
  Filled 2019-08-30: qty 20
  Filled 2019-08-30: qty 2

## 2019-08-30 MED ORDER — SODIUM CHLORIDE 0.9 % IV SOLN
500.0000 mg | INTRAVENOUS | Status: DC
  Administered 2019-08-31 – 2019-09-01 (×2): 500 mg via INTRAVENOUS
  Filled 2019-08-30 (×3): qty 500

## 2019-08-30 MED ORDER — SODIUM CHLORIDE (PF) 0.9 % IJ SOLN
INTRAMUSCULAR | Status: AC
  Filled 2019-08-30: qty 50

## 2019-08-30 MED ORDER — PANTOPRAZOLE SODIUM 40 MG PO TBEC
40.0000 mg | DELAYED_RELEASE_TABLET | Freq: Two times a day (BID) | ORAL | Status: DC
  Administered 2019-08-30 – 2019-09-02 (×6): 40 mg via ORAL
  Filled 2019-08-30 (×6): qty 1

## 2019-08-30 MED ORDER — ACETAMINOPHEN 325 MG PO TABS
650.0000 mg | ORAL_TABLET | Freq: Four times a day (QID) | ORAL | Status: DC | PRN
  Administered 2019-08-31 – 2019-09-02 (×6): 650 mg via ORAL
  Filled 2019-08-30 (×6): qty 2

## 2019-08-30 MED ORDER — FLUTICASONE PROPIONATE 50 MCG/ACT NA SUSP
1.0000 | Freq: Every day | NASAL | Status: DC
  Administered 2019-08-31 – 2019-09-02 (×3): 1 via NASAL
  Filled 2019-08-30: qty 16

## 2019-08-30 MED ORDER — RIVAROXABAN 15 MG PO TABS
15.0000 mg | ORAL_TABLET | Freq: Two times a day (BID) | ORAL | Status: DC
  Administered 2019-08-30 – 2019-09-02 (×6): 15 mg via ORAL
  Filled 2019-08-30 (×7): qty 1

## 2019-08-30 MED ORDER — SODIUM CHLORIDE 0.9 % IV SOLN: 250.0000 mL | INTRAVENOUS | Status: DC | PRN

## 2019-08-30 MED ORDER — SODIUM CHLORIDE 0.9% FLUSH: 3.0000 mL | INTRAVENOUS | Status: DC | PRN

## 2019-08-30 MED ORDER — ATORVASTATIN CALCIUM 10 MG PO TABS
10.0000 mg | ORAL_TABLET | Freq: Every day | ORAL | Status: DC
  Administered 2019-08-31 – 2019-09-02 (×3): 10 mg via ORAL
  Filled 2019-08-30 (×4): qty 1

## 2019-08-30 MED ORDER — SODIUM CHLORIDE 0.9% FLUSH
3.0000 mL | Freq: Two times a day (BID) | INTRAVENOUS | Status: DC
  Administered 2019-08-31: 3 mL via INTRAVENOUS

## 2019-08-30 MED ORDER — SODIUM CHLORIDE 0.9 % IV SOLN
2.0000 g | Freq: Once | INTRAVENOUS | Status: AC
  Administered 2019-08-30: 2 g via INTRAVENOUS
  Filled 2019-08-30: qty 20

## 2019-08-30 MED ORDER — SODIUM CHLORIDE 0.9% FLUSH: 3.0000 mL | Freq: Once | INTRAVENOUS | Status: DC

## 2019-08-30 MED ORDER — ESCITALOPRAM OXALATE 10 MG PO TABS
10.0000 mg | ORAL_TABLET | Freq: Every day | ORAL | Status: DC
  Administered 2019-08-31 – 2019-09-02 (×3): 10 mg via ORAL
  Filled 2019-08-30 (×4): qty 1

## 2019-08-30 MED ORDER — SODIUM CHLORIDE 0.9 % IV SOLN
INTRAVENOUS | Status: DC
  Administered 2019-08-30: 22:00:00 via INTRAVENOUS

## 2019-08-30 MED ORDER — ALBUTEROL SULFATE (2.5 MG/3ML) 0.083% IN NEBU: 2.5000 mg | INHALATION_SOLUTION | Freq: Four times a day (QID) | RESPIRATORY_TRACT | Status: DC | PRN

## 2019-08-30 MED ORDER — SODIUM CHLORIDE 0.9 % IV SOLN
500.0000 mg | Freq: Once | INTRAVENOUS | Status: AC
  Administered 2019-08-30: 500 mg via INTRAVENOUS
  Filled 2019-08-30: qty 500

## 2019-08-30 MED ORDER — SODIUM CHLORIDE 0.9% FLUSH
3.0000 mL | Freq: Two times a day (BID) | INTRAVENOUS | Status: DC
  Administered 2019-08-30 – 2019-09-02 (×3): 3 mL via INTRAVENOUS

## 2019-08-30 MED ORDER — IOHEXOL 300 MG/ML  SOLN: 100.0000 mL | Freq: Once | INTRAMUSCULAR | Status: DC | PRN

## 2019-08-30 MED ORDER — ONDANSETRON HCL 4 MG PO TABS: 4.0000 mg | ORAL_TABLET | Freq: Four times a day (QID) | ORAL | Status: DC | PRN

## 2019-08-30 MED ORDER — ONDANSETRON HCL 4 MG/2ML IJ SOLN: 4.0000 mg | Freq: Four times a day (QID) | INTRAMUSCULAR | Status: DC | PRN

## 2019-08-30 MED ORDER — RIVAROXABAN 20 MG PO TABS: 20.0000 mg | ORAL_TABLET | Freq: Every day | ORAL | Status: DC

## 2019-08-30 MED ORDER — ACETAMINOPHEN 650 MG RE SUPP: 650.0000 mg | Freq: Four times a day (QID) | RECTAL | Status: DC | PRN

## 2019-08-30 NOTE — ED Provider Notes (Signed)
[facility] DEPT Provider Note   CSN: [id number] Arrival date & time: 08/30/19  1019     History   Chief Complaint Chief Complaint  Patient presents with  . Shortness of Breath  . Chest Pain    HPI Wesley Hancock is a 48 y.o. male.     48 yo M with a chief complaint of chest pain shortness of breath cough.  Going on for the past couple days.  Patient was just in the hospital and diagnosed about half a week ago with a diagnosis of bilateral pulmonary embolism with pneumonia.  His cough and fever has improved somewhat.  Felt that his shortness of breath on exertion had worsened significantly in the past 24 hours.  He denies any missed doses of his Xarelto.  The history is provided by the patient and the spouse.  Shortness of Breath Severity:  Moderate Onset quality:  Gradual Duration:  2 days Timing:  Constant Progression:  Worsening Chronicity:  Recurrent Relieved by:  Nothing Worsened by:  Exertion Ineffective treatments:  None tried Associated symptoms: chest pain and cough   Associated symptoms: no abdominal pain, no fever, no headaches, no rash and no vomiting   Chest Pain Associated symptoms: cough and shortness of breath   Associated symptoms: no abdominal pain, no fever, no headache, no palpitations and no vomiting     Past Medical History:  Diagnosis Date  . Anxiety   . Depression   . DVT (deep venous thrombosis) ([place])   . High cholesterol   . Hyperlipidemia   . Hypertension   . Nutcracker esophagus   . Pulmonary embolus [facility])     Patient Active Problem List   Diagnosis Date Noted  . Dyspnea on exertion 09/08/2017  . High cholesterol 06/24/2017  . Depression 06/24/2017  . GAD (generalized anxiety disorder) 08/06/2016  . Essential hypertension 09/23/2014    Past Surgical History:  Procedure Laterality Date  . esophageal spasms     Was seen by GI doctor -- seen at [facility] for this        Home Medications     Prior to Admission medications   Medication Sig Start Date End Date Taking? Authorizing Provider  acetaminophen (TYLENOL) 500 MG tablet Take 1,000 mg by mouth every 6 (six) hours as needed for mild pain.   Yes [provider]  albuterol (VENTOLIN HFA) 108 (90 Base) MCG/ACT inhaler Inhale 1-2 puffs into the lungs every 6 (six) hours as needed for wheezing or shortness of breath. 06/20/19  Yes Carmon Sails J, PA-C  atorvastatin (LIPITOR) 10 MG tablet Take 1 tablet (10 mg total) by mouth daily. 06/19/19  Yes Inda Coke, PA  escitalopram (LEXAPRO) 10 MG tablet Take 1 tablet (10 mg total) by mouth daily. 06/19/19  Yes Worley, Aldona Bar, PA  fluticasone (FLONASE) 50 MCG/ACT nasal spray Place 1 spray into both nostrils daily.    Yes [provider]  levofloxacin (LEVAQUIN) 750 MG tablet Take 750 mg by mouth daily. 08/28/19 09/02/19 Yes [provider]  nitroGLYCERIN (NITROSTAT) 0.4 MG SL tablet Place 1 tablet (0.4 mg total) under the tongue every 5 (five) minutes as needed for chest pain. 03/07/18  Yes Inda Coke, PA  omeprazole (PRILOSEC) 20 MG capsule Take 20 mg by mouth 2 (two) times daily before a meal.   Yes [provider]  predniSONE (DELTASONE) 20 MG tablet Take 10-60 mg by mouth as directed. 60mg  two days 40mg  two days 20mg  two days 10mg  two days  08/27/19 09/04/19 Yes [provider]  Rivaroxaban (XARELTO) 15 MG TABS tablet Take 15 mg by mouth 2 (two) times daily with a meal.   Yes [provider]  telmisartan-hydrochlorothiazide (MICARDIS HCT) 80-25 MG tablet Take 1 tablet by mouth daily. 06/19/19  Yes Inda Coke, PA  azithromycin (ZITHROMAX) 250 MG tablet Take two tablets on day 1, then one tablet daily x 4 days Patient not taking: Reported on 08/30/2019 06/20/19   Inda Coke, PA    Family History Family History  Problem Relation Age of Onset  . Diabetes Mother   . Hyperlipidemia Mother   . Hypertension Mother   .  Heart disease Father 70  . Hyperlipidemia Father   . Hypertension Father   . Prostate cancer Father 39  . Lung cancer Father 5       Dx 06/18/2017  . Diabetes Sister   . Hyperlipidemia Sister   . Hypertension Sister   . Diabetes Maternal Grandmother   . Heart disease Maternal Grandmother   . Hyperlipidemia Maternal Grandmother   . Hypertension Maternal Grandmother   . Colon cancer Maternal Grandmother   . Heart disease Maternal Grandfather   . Hyperlipidemia Maternal Grandfather   . Hypertension Maternal Grandfather   . Stroke Maternal Grandfather   . Liver cancer Maternal Grandfather   . Heart disease Paternal Grandmother     Social History Social History   Tobacco Use  . Smoking status: Former Research scientist (life sciences)  . Smokeless tobacco: Never Used  Substance Use Topics  . Alcohol use: Yes    Alcohol/week: 5.0 standard drinks    Types: 5 Cans of beer per week  . Drug use: No     Allergies   Diltiazem hcl   Review of Systems Review of Systems  Constitutional: Negative for chills and fever.  HENT: Negative for congestion and facial swelling.   Eyes: Negative for discharge and visual disturbance.  Respiratory: Positive for cough and shortness of breath.   Cardiovascular: Positive for chest pain. Negative for palpitations.  Gastrointestinal: Negative for abdominal pain, diarrhea and vomiting.  Musculoskeletal: Negative for arthralgias and myalgias.  Skin: Negative for color change and rash.  Neurological: Negative for tremors, syncope and headaches.  Psychiatric/Behavioral: Negative for confusion and dysphoric mood.     Physical Exam Updated Vital Signs BP (!) 144/81   Pulse (!) 49   Temp 98.5 F (36.9 C) (Oral)   Resp (!) 28   Ht 6\' 1"  (1.854 m)   Wt 127 kg   SpO2 93%   BMI 36.94 kg/m   Physical Exam   ED Treatments / Results  Labs (all labs ordered are listed, but only abnormal results are displayed) Labs Reviewed  BASIC METABOLIC PANEL - Abnormal; Notable  for the following components:      Result Value   Glucose, Bld 104 (*)    BUN 28 (*)    Creatinine, Ser 1.42 (*)    Calcium 8.5 (*)    GFR calc non Af Amer 58 (*)    All other components within normal limits  CBC - Abnormal; Notable for the following components:   WBC >14.7 (*)    RBC 4.03 (*)    Hemoglobin 12.4 (*)    HCT 37.2 (*)    All other components within normal limits  BRAIN NATRIURETIC PEPTIDE - Abnormal; Notable for the following components:   B Natriuretic Peptide 530.0 (*)    All other components within normal limits  I-STAT CHEM 8, ED - Abnormal;  Notable for the following components:   BUN 25 (*)    Creatinine, Ser 1.30 (*)    Hemoglobin 12.9 (*)    HCT 38.0 (*)    All other components within normal limits  TROPONIN I (HIGH SENSITIVITY) - Abnormal; Notable for the following components:   Troponin I (High Sensitivity) 23 (*)    All other components within normal limits  TROPONIN I (HIGH SENSITIVITY)    EKG EKG Interpretation  Date/Time:  Wednesday August 30 2019 10:38:40 EDT Ventricular Rate:  69 PR Interval:    QRS Duration: 95 QT Interval:  426 QTC Calculation: 457 R Axis:   45 Text Interpretation:  Sinus rhythm Abnormal R-wave progression, early transition Baseline wander in lead(s) V1 No significant change since last tracing Confirmed by Deno Etienne [phone]) on 08/30/2019 12:12:37 PM   Radiology Dg Chest 2 View  Result Date: 08/30/2019 CLINICAL DATA:  Chest tightness and shortness of breath EXAM: CHEST - 2 VIEW COMPARISON:  June 20, 2019 FINDINGS: There is extensive airspace disease throughout much of the right lung and in a more patchy distribution in the left lung. Heart is borderline enlarged with pulmonary vascularity normal. No adenopathy. No bone lesions. No pneumothorax. IMPRESSION: Multifocal pneumonia bilaterally, more severe on the right than on the left. Borderline cardiomegaly. No evident adenopathy. Electronically Signed   By: Lowella Grip III M.D.   On: 08/30/2019 11:18    Procedures Procedures (including critical care time)  Medications Ordered in ED Medications  sodium chloride (PF) 0.9 % injection (has no administration in time range)  sodium chloride 0.9 % bolus 1,000 mL (has no administration in time range)  iohexol (OMNIPAQUE) 350 MG/ML injection 80 mL (80 mLs Intravenous Contrast Given 08/30/19 1512)     Initial Impression / Assessment and Plan / ED Course  I have reviewed the triage vital signs and the nursing notes.  Pertinent labs & imaging results that were available during my care of the patient were reviewed by me and considered in my medical decision making (see chart for details).        48 yo M with a chief complaint of shortness of breath on exertion going on for the past couple days.  This had initially occurred with a hospitalization where he is newly diagnosed with a pulmonary embolism.  Was discharged about 5 days ago and was started on Xarelto.  Had sudden worsening of his symptoms yesterday.  Will obtain a repeat CT scan.  Chest x-ray is concerning for multifocal pneumonia.  The patient's troponin is elevated as well as the BNP.  This is concerning for a pulmonary embolism, still awaiting CT scan.  Signed out to Dr. Sedonia Small, please see his note for further details care in the ED.  CRITICAL CARE Performed by: Cecilio Asper   Total critical care time: 35 minutes  Critical care time was exclusive of separately billable procedures and treating other patients.  Critical care was necessary to treat or prevent imminent or life-threatening deterioration.  Critical care was time spent personally by me on the following activities: development of treatment plan with patient and/or surrogate as well as nursing, discussions with consultants, evaluation of patient's response to treatment, examination of patient, obtaining history from patient or surrogate, ordering and performing treatments  and interventions, ordering and review of laboratory studies, ordering and review of radiographic studies, pulse oximetry and re-evaluation of patient's condition.  The patients results and plan were reviewed and discussed.   Any x-rays performed  were independently reviewed by myself.   Differential diagnosis were considered with the presenting HPI.  Medications  sodium chloride (PF) 0.9 % injection (has no administration in time range)  sodium chloride 0.9 % bolus 1,000 mL (has no administration in time range)  iohexol (OMNIPAQUE) 350 MG/ML injection 80 mL (80 mLs Intravenous Contrast Given 08/30/19 1512)    Vitals:   08/30/19 1330 08/30/19 1403 08/30/19 1430 08/30/19 1500  BP: 140/76 (!) 145/85 (!) 146/86 (!) 144/81  Pulse: 61 (!) 55 (!) 50 (!) 49  Resp:  (!) 26 (!) 27 (!) 28  Temp:      TempSrc:      SpO2: 96% 96% 96% 93%  Weight:      Height:        Final diagnoses:  SOB (shortness of breath) on exertion  Troponin level elevated    Admission/ observation were discussed with the admitting physician, patient and/or family and they are comfortable with the plan.    Final Clinical Impressions(s) / ED Diagnoses   Final diagnoses:  SOB (shortness of breath) on exertion  Troponin level elevated    ED Discharge Orders    None       Deno Etienne, DO 08/30/19 1521

## 2019-08-30 NOTE — H&P (Addendum)
History and Physical  Wesley Hancock [id number] DOB: [birth year] DOA: 08/30/2019  PCP: Inda Coke, PA   Chief Complaint: short of breath  HPI:  48 year old man recently hospitalized for DVT, PE, pneumonia who presented to the [facility] emergency department 9/30 for increasing shortness of breath and hypoxia.  Admitted for pneumonia and ongoing treatment of PE/DVT.  Patient was hospitalized 9/22--9/27 for acute bilateral pulmonary emboli, lower extremity DVT, bilateral pneumonia, acute hypoxic respiratory failure and acute kidney injury.  He was discharged on Xarelto, Levaquin, steroid taper and 2 L nasal cannula.  Acute kidney injury thought multifactorial including contrast injury, ARB, diuretic and Toradol.  Initially felt well when he went home but yesterday noted increasing shortness of breath and oxygen saturations in the 80s.  Breathing status continued to worsen and so he came to the emergency department today for evaluation.  He has had some chest pain on the left side from the PE.  No specific aggravating or alleviating factors.  He has been compliant with Xarelto and antibiotic.  No fever at home.  ED Course: CT showed bilateral pneumonia, bilateral peripheral PE.  Treated with ceftriaxone and azithromycin and referred for admission.  Review of Systems:  Negative for fever, new visual changes, sore throat, rash, new muscle aches, chest pain, dysuria, bleeding, n/v/abdominal pain.  Past Medical History:  Diagnosis Date   Anxiety    Depression    DVT (deep venous thrombosis) ([name])    High cholesterol    Hyperlipidemia    Hypertension    Nutcracker esophagus    Pulmonary embolus ([name])     Past Surgical History:  Procedure Laterality Date   CHOLECYSTECTOMY       reports that he has quit smoking. He has never used smokeless tobacco. He reports current alcohol use of about 5.0 standard drinks of alcohol per week. He reports that he does not use  drugs. Mobility: Ambulatory  Truck driver  Allergies  Allergen Reactions   Diltiazem Hcl Diarrhea    Lethargic as well    Family History  Problem Relation Age of Onset   Diabetes Mother    Hyperlipidemia Mother    Hypertension Mother    Heart disease Father 37   Hyperlipidemia Father    Hypertension Father    Prostate cancer Father 55   Lung cancer Father 78       Dx 06/18/2017   Diabetes Sister    Hyperlipidemia Sister    Hypertension Sister    Diabetes Maternal Grandmother    Heart disease Maternal Grandmother    Hyperlipidemia Maternal Grandmother    Hypertension Maternal Grandmother    Colon cancer Maternal Grandmother    Heart disease Maternal Grandfather    Hyperlipidemia Maternal Grandfather    Hypertension Maternal Grandfather    Stroke Maternal Grandfather    Liver cancer Maternal Grandfather    Heart disease Paternal Grandmother      Prior to Admission medications   Medication Sig Start Date End Date Taking? Authorizing Provider  acetaminophen (TYLENOL) 500 MG tablet Take 1,000 mg by mouth every 6 (six) hours as needed for mild pain.   Yes [provider]  albuterol (VENTOLIN HFA) 108 (90 Base) MCG/ACT inhaler Inhale 1-2 puffs into the lungs every 6 (six) hours as needed for wheezing or shortness of breath. 06/20/19  Yes Carmon Sails J, PA-C  atorvastatin (LIPITOR) 10 MG tablet Take 1 tablet (10 mg total) by mouth daily. 06/19/19  Yes Inda Coke, PA  escitalopram (LEXAPRO) 10 MG  tablet Take 1 tablet (10 mg total) by mouth daily. 06/19/19  Yes Worley, Aldona Bar, PA  fluticasone (FLONASE) 50 MCG/ACT nasal spray Place 1 spray into both nostrils daily.    Yes [provider]  levofloxacin (LEVAQUIN) 750 MG tablet Take 750 mg by mouth daily. 08/28/19 09/02/19 Yes [provider]  nitroGLYCERIN (NITROSTAT) 0.4 MG SL tablet Place 1 tablet (0.4 mg total) under the tongue every 5 (five) minutes as needed for chest  pain. 03/07/18  Yes Inda Coke, PA  omeprazole (PRILOSEC) 20 MG capsule Take 20 mg by mouth 2 (two) times daily before a meal.   Yes [provider]  predniSONE (DELTASONE) 20 MG tablet Take 10-60 mg by mouth as directed. 60mg  two days 40mg  two days 20mg  two days 10mg  two days 08/27/19 09/04/19 Yes [provider]  Rivaroxaban (XARELTO) 15 MG TABS tablet Take 15 mg by mouth 2 (two) times daily with a meal.   Yes [provider]  telmisartan-hydrochlorothiazide (MICARDIS HCT) 80-25 MG tablet Take 1 tablet by mouth daily. 06/19/19  Yes Inda Coke, PA  azithromycin (ZITHROMAX) 250 MG tablet Take two tablets on day 1, then one tablet daily x 4 days Patient not taking: Reported on 08/30/2019 06/20/19   Inda Coke, PA    Physical Exam: Vitals:   08/30/19 1630 08/30/19 1700  BP: (!) 154/90 (!) 151/95  Pulse: (!) 53 (!) 49  Resp: (!) 29 (!) 28  Temp:    SpO2: 96% 98%    Constitutional:    Appears calm, mildly uncomfortable Eyes:   pupils and irises appear normal  Normal lids  ENMT:   grossly normal hearing   Lips appear normal Neck:   neck appears normal, no masses  no thyromegaly Respiratory:   CTA bilaterally, no w/r/r.   Respiratory effort mildly increased.  Mild dyspnea when talking. Cardiovascular:   Bradycardic, regular rhythm, no m/r/g  No LE extremity edema   Abdomen:   Soft, nontender, nondistended  No hernias Skin:   No rashes, lesions, ulcers  palpation of skin: no induration or nodules Psychiatric:   Mental status o Mood, affect appropriate o Orientation to person, place, time   judgment and insight appear intact    I have personally reviewed following labs and imaging studies  Labs:  Creatinine 1.30, remainder BMP unremarkable BNP 530 High-sensitivity troponin flat, 23, 21 WBC mildly elevated at 14.7.  Hemoglobin 12.9. Blood cultures pending SARS-CoV-2 negative  Imaging studies:   Chest x-ray  independently reviewed, multifocal pneumonia  CT chest small bilateral lower lobe pulmonary emboli left greater than right.  No evidence of right heart strain.  Moderate left pleural effusion.  Extensive bilateral airspace opacities out of proportion to pulmonary emboli in the lower lobes most compatible with multifocal pneumonia.  Medical tests:   EKG independently reviewed: Sinus rhythm, no acute changes  Principal Problem:   Lobar pneumonia ([name]) Active Problems:   Bilateral pneumonia   Acute hypoxemic respiratory failure ([name])   AKI (acute kidney injury) ([place])   Pulmonary emboli ([name])   DVT (deep venous thrombosis) ([name])   Assessment/Plan Acute/subacute hypoxic respiratory failure, multifactorial multifocal: Multifocal pneumonia, bilateral PE. Elevated BNP of unclear significance.  Echocardiogram 9/24 at outside facility LVEF 55-65%.  Modest troponin elevation stable, no clinical significance. --Suspect driving force for this hospitalization is partially treated pneumonia rather than pulmonary embolism as PE appear to be relatively small and peripheral and consistent with CT report at outside facility. --Empiric antibiotics, follow-up culture data --Continue  Xarelto  Bilateral PE/DVT diagnosed earlier this month --Continue Xarelto  Acute kidney injury from hospitalization at outside facility --Significantly improved renal function compared to discharge 9/27 (2.47) --IV fluids, trend BMP  Severity of Illness: The appropriate patient status for this patient is INPATIENT. Inpatient status is judged to be reasonable and necessary in order to provide the required intensity of service to ensure the patient's safety. The patient's presenting symptoms, physical exam findings, and initial radiographic and laboratory data in the context of their chronic comorbidities is felt to place them at high risk for further clinical deterioration. Furthermore, it is not anticipated that the patient  will be medically stable for discharge from the hospital within 2 midnights of admission. The following factors support the patient status of inpatient.   " The patient's presenting symptoms include shortness of breath. " The worrisome physical exam findings include hypoxia, dyspnea. " The initial radiographic and laboratory data are worrisome because of multifocal pneumonia, bilateral pulmonary embolism. " The chronic co-morbidities include hypertension.   * I certify that at the point of admission it is my clinical judgment that the patient will require inpatient hospital care spanning beyond 2 midnights from the point of admission due to high intensity of service, high risk for further deterioration and high frequency of surveillance required.*   DVT prophylaxis: Xarelto Code Status: Full Family Communication: none Consults called: none    Time spent: 60 minutes  Murray Hodgkins, MD  [facility] Direct contact: see [email or web address]  7PM-7AM contact night coverage as below   1. Check the care team in [facility] and look for a) attending/consulting [facility] provider listed and b) the [facility] team listed 2. Log into [email or web address] and use [place]'s universal password to access. If you do not have the password, please contact the hospital operator. 3. Locate the [facility] provider you are looking for under [facility] and page to a number that you can be directly reached. 4. If you still have difficulty reaching the provider, please page the [facility] (Director on Call) for the Hospitalists listed on amion for assistance.   08/30/2019, 6:56 PM

## 2019-08-30 NOTE — Telephone Encounter (Signed)
Noted, Pt is in the ED

## 2019-08-30 NOTE — Telephone Encounter (Signed)
Pt called in c/o feeling a little tight in my chest and short of breath.   O2 saturation with O2 on is 88%.   It has been running 93% on the O2.    He was in the hospital at [facility] in [place] last Tuesday through Sunday.   He was having a bad pain in his back and could not breath so he went there because it was closest to him.   He was diagnosed with a pulmonary embolism.    He was sent home on O2 and taking Zarelto. He has an appt at 3:30 today with Inda Coke, PA  but he didn't know if he should wait until the appt or go back to the hospital.  I referred him to the ED due to the PE, tightness in his chest and shortness of breath with decreased O2 saturations.   He was agreeable to this.    He asked if it was ok to come to a [facility] hospital instead of [facility].   I let him know it was fine to come to a [facility] hospital.   His records from [place] are visible in the computer for the [place] providers to see.   He is going to [facility] ED now. He does not want to cancel his appt today at 3:30 with [facility].     He wants to see what they tell him in the ED.   He said he would keep [place] updated and would call back if he was not going to be able to come to the appt.  I let him know I would let Inda Coke, PA know I had referred him to the ED.  I sent my notes to the office for [facility] information.  Reason for Disposition . History of prior "blood clot" in leg or lungs (i.e., deep vein thrombosis, pulmonary embolism)  Answer Assessment - Initial Assessment Questions 1. RESPIRATORY STATUS: "Describe your breathing?" (e.g., wheezing, shortness of breath, unable to speak, severe coughing)      I had a PE from Tues. Last week until Sun. In hospital.    I was sent home on O2.    2. ONSET: "When did this breathing problem begin?"      Last night I felt tight.    I'm Sat 88% with O2.    I have 3:30 appt today.   My normal sat is 93% on O2.  3. PATTERN "Does the difficult  breathing come and go, or has it been constant since it started?"      I feel a little short of breath.   I feel tighter in my chest over the last 2 days worse today.    I'm on Xarelto for the PE.     I couldn't breath at all when I was admitted to the hospital and pain in my back.     Should I come to the appt today  Or go to the hospital?    I went to [facility] in [place] because it was the closest to me at the time all this happened.    Can I come to a [place] let him know he could and that his records from [place] are visible in the computer so [facility] would have access to them.     He has decided to go to [facility] ED upon my advice.   4. SEVERITY: "How bad is your breathing?" (e.g., mild, moderate, severe)    -  MILD: No SOB at rest, mild SOB with walking, speaks normally in sentences, can lay down, no retractions, pulse < 100.    - MODERATE: SOB at rest, SOB with minimal exertion and prefers to sit, cannot lie down flat, speaks in phrases, mild retractions, audible wheezing, pulse 100-120.    - SEVERE: Very SOB at rest, speaks in single words, struggling to breathe, sitting hunched forward, retractions, pulse > 120      Tighter feeling in chest and feeling short of breath 5. RECURRENT SYMPTOM: "Have you had difficulty breathing before?" If so, ask: "When was the last time?" and "What happened that time?"      No 6. CARDIAC HISTORY: "Do you have any history of heart disease?" (e.g., heart attack, angina, bypass surgery, angioplasty)      No 7. LUNG HISTORY: "Do you have any history of lung disease?"  (e.g., pulmonary embolus, asthma, emphysema)     PE   Was in hospital last week. 8. CAUSE: "What do you think is causing the breathing problem?"      I'm not sure.   I'm on the blood thinner and O2. 9. OTHER SYMPTOMS: "Do you have any other symptoms? (e.g., dizziness, runny nose, cough, chest pain, fever)     No 10. PREGNANCY: "Is there any chance you are pregnant?"  "When was your last menstrual period?"       N/A 11. TRAVEL: "Have you traveled out of the country in the last month?" (e.g., travel history, exposures)       *No Answer*  Protocols used: BREATHING DIFFICULTY-A-AH

## 2019-08-30 NOTE — Telephone Encounter (Signed)
See note

## 2019-08-30 NOTE — ED Provider Notes (Signed)
  Provider Note MRN:  [id number]  Arrival date & time: 08/30/19    ED Course and Medical Decision Making  Assumed care from Dr. Tyrone Nine at shift change.  Recent admission for bilateral pulmonary embolism, here with worsening dyspnea on exertion, question of multifocal pneumonia, will undergo repeat CTA for further evaluation.  May need admission.  3:40 PM update: CT shows bilateral pulmonary emboli, radiologist did not compare to prior scan that was done at [facility].  There is also evidence of multifocal pneumonia.  Given patient's worsened hypoxia at home despite being discharged on 2 L nasal cannula, given the leukocytosis, poor functional status, will admit to hospital service.  Final Clinical Impressions(s) / ED Diagnoses     ICD-10-CM   1. SOB (shortness of breath) on exertion  R06.02   2. Troponin level elevated  R79.89   3. Multifocal pneumonia  J18.9     ED Discharge Orders    None      Discharge Instructions   None     Barth Kirks. [place]onia Small, [place] [email or web address]    Maudie Flakes, MD 08/30/19 [phone]

## 2019-08-30 NOTE — ED Triage Notes (Signed)
Patient states he was recently hospitalized for PE's and pneumonia. Patient c/o chest pain, increased SOB, decreased sats, Patient had an appointment and was told to come to the ED

## 2019-08-31 LAB — CBC
HCT: 33.6 % — ABNORMAL LOW (ref 39.0–52.0)
Hemoglobin: 10.8 g/dL — ABNORMAL LOW (ref 13.0–17.0)
MCH: 30.3 pg (ref 26.0–34.0)
MCHC: 32.1 g/dL (ref 30.0–36.0)
MCV: 94.1 fL (ref 80.0–100.0)
Platelets: 199 10*3/uL (ref 150–400)
RBC: 3.57 MIL/uL — ABNORMAL LOW (ref 4.22–5.81)
RDW: 12.8 % (ref 11.5–15.5)
WBC: 9.1 10*3/uL (ref 4.0–10.5)
nRBC: 0 % (ref 0.0–0.2)

## 2019-08-31 LAB — BASIC METABOLIC PANEL
Anion gap: 8 (ref 5–15)
BUN: 28 mg/dL — ABNORMAL HIGH (ref 6–20)
CO2: 25 mmol/L (ref 22–32)
Calcium: 8.2 mg/dL — ABNORMAL LOW (ref 8.9–10.3)
Chloride: 106 mmol/L (ref 98–111)
Creatinine, Ser: 1.52 mg/dL — ABNORMAL HIGH (ref 0.61–1.24)
GFR calc Af Amer: >60 mL/min (ref >60)
GFR calc non Af Amer: 54 mL/min — ABNORMAL LOW (ref >60)
Glucose, Bld: 91 mg/dL (ref 70–99)
Potassium: 3.1 mmol/L — ABNORMAL LOW (ref 3.5–5.1)
Sodium: 139 mmol/L (ref 135–145)

## 2019-08-31 LAB — C DIFFICILE QUICK SCREEN W PCR REFLEX
C Diff antigen: NEGATIVE
C Diff interpretation: NOT DETECTED
C Diff toxin: NEGATIVE

## 2019-08-31 LAB — HIV ANTIBODY (ROUTINE TESTING W REFLEX): HIV Screen 4th Generation wRfx: NONREACTIVE

## 2019-08-31 MED ORDER — BACID PO TABS
2.0000 | ORAL_TABLET | Freq: Three times a day (TID) | ORAL | Status: DC
  Filled 2019-08-31: qty 2

## 2019-08-31 MED ORDER — LOPERAMIDE HCL 2 MG PO CAPS
2.0000 mg | ORAL_CAPSULE | ORAL | Status: DC | PRN
  Administered 2019-08-31: 2 mg via ORAL
  Filled 2019-08-31: qty 1

## 2019-08-31 MED ORDER — TRAZODONE HCL 50 MG PO TABS
50.0000 mg | ORAL_TABLET | Freq: Every day | ORAL | Status: DC
  Administered 2019-08-31 – 2019-09-01 (×2): 50 mg via ORAL
  Filled 2019-08-31 (×2): qty 1

## 2019-08-31 MED ORDER — FLORANEX PO PACK
1.0000 g | PACK | Freq: Three times a day (TID) | ORAL | Status: DC
  Administered 2019-08-31 – 2019-09-02 (×6): 1 g via ORAL
  Filled 2019-08-31 (×11): qty 1

## 2019-08-31 MED ORDER — GUAIFENESIN-DM 100-10 MG/5ML PO SYRP
5.0000 mL | ORAL_SOLUTION | ORAL | Status: DC | PRN
  Administered 2019-08-31 – 2019-09-02 (×4): 5 mL via ORAL
  Filled 2019-08-31 (×4): qty 10

## 2019-08-31 MED ORDER — GERHARDT'S BUTT CREAM
TOPICAL_CREAM | CUTANEOUS | Status: DC | PRN
  Administered 2019-08-31: 10:00:00 via TOPICAL
  Filled 2019-08-31: qty 1

## 2019-08-31 NOTE — Progress Notes (Signed)
PROGRESS NOTE    Wesley Hancock  [id number] DOB: 1971-02-26 DOA: 08/30/2019 PCP: Inda Coke, PA   Brief Narrative:   Wesley Hancock is a 48 year old Caucasian gentleman with past medical history remarkable for essential hypertension, GERD, depression, HLD, and recently diagnosed with PE/DVT on Xarelto, who presented to the [facility] long emergency department with progressive shortness of breath.   Patient was hospitalized 9/22--9/27 for acute bilateral pulmonary emboli, lower extremity DVT, bilateral pneumonia, acute hypoxic respiratory failure and acute kidney injury.  He was discharged on Xarelto, Levaquin, steroid taper and 2 L nasal cannula.  Acute kidney injury thought multifactorial including contrast injury, ARB, diuretic and Toradol.  Initially felt well when he went home but yesterday noted increasing shortness of breath and oxygen saturations in the 80s.  Breathing status continued to worsen and so he came to the emergency department today for evaluation.  He has had some chest pain on the left side from the PE.  No specific aggravating or alleviating factors.  He has been compliant with Xarelto and antibiotic.  No fever at home.  CT showed bilateral pneumonia, bilateral peripheral PE.  Treated with ceftriaxone and azithromycin and referred for admission.  Assessment & Plan:   Principal Problem:   Lobar pneumonia ([place]) Active Problems:   Bilateral pneumonia   Acute hypoxemic respiratory failure ([place])   AKI (acute kidney injury) ([place])   Pulmonary emboli ([name])   DVT (deep venous thrombosis) ([name])   Acute hypoxic respiratory failure Multifocal pneumonia Bilateral pulmonary embolism Patient presenting to ED with progressive shortness of breath.  Recently discharged from outside hospital with new diagnosis of PE/DVT and pneumonia.  Patient reports compliance with his home Xarelto, and was discharged on 2 L nasal cannula.  BNP slightly elevated at 530.  WBC count  elevated 14.7 on admission.  Troponin flat at 23 followed by 21.  Recent echocardiogram on 08/24/2019 notable for LVEF 55-65%.  CTA PE notable for extensive bilateral airway opacities out of proportion in regards to his pulmonary embolism consistent with multifocal pneumonia.  MRSA PCR negative. --Sputum culture: Pending --Blood cultures x2: No growth x12 hours --Continue empiric antibiotics with azithromycin and ceftriaxone --Titrate supplemental oxygen to maintain SPO2 greater than 92% --Attempt 6-minute walk test today --Supportive care  Bilateral pulmonary embolism DVT --Continue Xarelto  Acute renal failure Most recent creatinine 2.47 on outside hospital discharge on 08/27/2019. Acute kidney injury thought multifactorial including contrast injury, ARB, diuretic and Toradol.  Also did have a contrast enhanced CTA on this hospitalization in the ED. --Cr 1.42-->1.30-->1.52 --Holding home ARB/HCTZ --Avoid nephrotoxins --Renally dose all medications --Follow BMP daily  GERD: Continue PPI  Depression: Continue Lexapro  Diarrhea --C. difficile negative on admission --Start probiotic while on antibiotics as above --Imodium as needed  Insomnia: Trazodone 50 mg p.o. nightly  HLD: Continue statin  DVT prophylaxis: SCDs Code Status: Full code Family Communication: None Disposition Plan: Continue inpatient, IV antibiotics, attempt to titrate off of supplemental oxygen, further depend on clinical course   Consultants:   None  Procedures:   None  Antimicrobials:  Azithromycin 9/30  Ceftriaxone 9/30   Subjective: Patient seen and examined at bedside, resting comfortably.  Continues with shortness of breath.  Requiring supplemental oxygen.  Asking if he can have something for sleep tonight as he only got 2 hours of sleep the previous night.  Also asking for something for his chronic diarrhea; especially while on IV antibiotics.  No other concerns or complaints at this time.   Denies  headache, no fever/chills/night sweats, no chest pain, no palpitations, no abdominal pain, no weakness, no paresthesias.  No acute events overnight per nursing staff.  Objective: Vitals:   08/30/19 1856 08/30/19 1900 08/30/19 2100 08/31/19 0647  BP: 139/88 (!) 146/85 140/84 (!) 146/84  Pulse: (!) 55 (!) 54 62   Resp: (!) 26 (!) 33 20 18  Temp:    98.2 F (36.8 C)  TempSrc:    Oral  SpO2: 99% 96% 94% 92%  Weight:      Height:        Intake/Output Summary (Last 24 hours) at 08/31/2019 1439 Last data filed at 08/31/2019 1200 Gross per 24 hour  Intake 590 ml  Output --  Net 590 ml   Filed Weights   08/30/19 1042  Weight: 127 kg    Examination:  General exam: Appears calm and comfortable  Respiratory system: Clear to auscultation. Respiratory effort normal. Cardiovascular system: S1 & S2 heard, RRR. No JVD, murmurs, rubs, gallops or clicks. No pedal edema. Gastrointestinal system: Abdomen is nondistended, soft and nontender. No organomegaly or masses felt. Normal bowel sounds heard. Central nervous system: Alert and oriented. No focal neurological deficits. Extremities: Symmetric 5 x 5 power. Skin: No rashes, lesions or ulcers Psychiatry: Judgement and insight appear normal. Mood & affect appropriate.     Data Reviewed: I have personally reviewed following labs and imaging studies  CBC: Recent Labs  Lab 08/30/19 1244 08/30/19 1329 08/31/19 0524  WBC >14.7*  --  9.1  HGB 12.4* 12.9* 10.8*  HCT 37.2* 38.0* 33.6*  MCV 92.3  --  94.1  PLT 203  --  [id number]   Basic Metabolic Panel: Recent Labs  Lab 08/30/19 1244 08/30/19 1329 08/31/19 0524  NA 140 142 139  K 3.6 3.7 3.1*  CL 105 103 106  CO2 25  --  25  GLUCOSE 104* 97 91  BUN 28* 25* 28*  CREATININE 1.42* 1.30* 1.52*  CALCIUM 8.5*  --  8.2*   GFR: Estimated Creatinine Clearance: 83.9 mL/min (A) (by C-G formula based on SCr of 1.52 mg/dL (H)). Liver Function Tests: No results for input(s): AST, ALT,  ALKPHOS, BILITOT, PROT, ALBUMIN in the last 168 hours. No results for input(s): LIPASE, AMYLASE in the last 168 hours. No results for input(s): AMMONIA in the last 168 hours. Coagulation Profile: No results for input(s): INR, PROTIME in the last 168 hours. Cardiac Enzymes: No results for input(s): CKTOTAL, CKMB, CKMBINDEX, TROPONINI in the last 168 hours. BNP (last 3 results) No results for input(s): PROBNP in the last 8760 hours. HbA1C: No results for input(s): HGBA1C in the last 72 hours. CBG: No results for input(s): GLUCAP in the last 168 hours. Lipid Profile: No results for input(s): CHOL, HDL, LDLCALC, TRIG, CHOLHDL, LDLDIRECT in the last 72 hours. Thyroid Function Tests: No results for input(s): TSH, T4TOTAL, FREET4, T3FREE, THYROIDAB in the last 72 hours. Anemia Panel: No results for input(s): VITAMINB12, FOLATE, FERRITIN, TIBC, IRON, RETICCTPCT in the last 72 hours. Sepsis Labs: No results for input(s): PROCALCITON, LATICACIDVEN in the last 168 hours.  Recent Results (from the past 240 hour(s))  SARS Coronavirus 2 [facility] order, Performed in [facility] hospital lab) Nasopharyngeal Nasopharyngeal Swab     Status: None   Collection Time: 08/30/19  3:48 PM   Specimen: Nasopharyngeal Swab  Result Value Ref Range Status   SARS Coronavirus 2 NEGATIVE NEGATIVE Final    Comment: (NOTE) If result is NEGATIVE SARS-CoV-2 target nucleic acids are NOT DETECTED.  The SARS-CoV-2 RNA is generally detectable in upper and lower  respiratory specimens during the acute phase of infection. The lowest  concentration of SARS-CoV-2 viral copies this assay can detect is 250  copies / mL. A negative result does not preclude SARS-CoV-2 infection  and should not be used as the sole basis for treatment or other  patient management decisions.  A negative result may occur with  improper specimen collection / handling, submission of specimen other  than nasopharyngeal swab, presence of viral  mutation(s) within the  areas targeted by this assay, and inadequate number of viral copies  (<250 copies / mL). A negative result must be combined with clinical  observations, patient history, and epidemiological information. If result is POSITIVE SARS-CoV-2 target nucleic acids are DETECTED. The SARS-CoV-2 RNA is generally detectable in upper and lower  respiratory specimens dur ing the acute phase of infection.  Positive  results are indicative of active infection with SARS-CoV-2.  Clinical  correlation with patient history and other diagnostic information is  necessary to determine patient infection status.  Positive results do  not rule out bacterial infection or co-infection with other viruses. If result is PRESUMPTIVE POSTIVE SARS-CoV-2 nucleic acids MAY BE PRESENT.   A presumptive positive result was obtained on the submitted specimen  and confirmed on repeat testing.  While 2019 novel coronavirus  (SARS-CoV-2) nucleic acids may be present in the submitted sample  additional confirmatory testing may be necessary for epidemiological  and / or clinical management purposes  to differentiate between  SARS-CoV-2 and other Sarbecovirus currently known to infect humans.  If clinically indicated additional testing with an alternate test  methodology [phone]) is advised. The SARS-CoV-2 RNA is generally  detectable in upper and lower respiratory sp ecimens during the acute  phase of infection. The expected result is Negative. Fact Sheet for Patients:  StrictlyIdeas.no Fact Sheet for Healthcare Providers: BankingDealers.co.za This test is not yet approved or cleared by the [place] FDA and has been authorized for detection and/or diagnosis of SARS-CoV-2 by FDA under an Emergency Use Authorization (EUA).  This EUA will remain in effect (meaning this test can be used) for the duration of the COVID-19 declaration under Section 564(b)(1)  of the Act, 21 U.S.C. section 360bbb-3(b)(1), unless the authorization is terminated or revoked sooner. Performed at [facility], [place] [address] [place], [place] [address]   Culture, blood (Routine X 2) w Reflex to ID Panel     Status: None (Preliminary result)   Collection Time: 08/30/19  3:50 PM   Specimen: BLOOD  Result Value Ref Range Status   Specimen Description   Final    BLOOD RIGHT ANTECUBITAL Performed at [facility] [address], [place], [place] [address]    Special Requests   Final    BOTTLES DRAWN AEROBIC AND ANAEROBIC Blood Culture adequate volume Performed at [facility] [address] [place], [place] [address]    Culture   Final    NO GROWTH < 12 HOURS Performed at [facility] [address], [place], [place] [address]    Report Status PENDING  Incomplete  Culture, blood (Routine X 2) w Reflex to ID Panel     Status: None (Preliminary result)   Collection Time: 08/30/19  4:07 PM   Specimen: BLOOD  Result Value Ref Range Status   Specimen Description   Final    BLOOD LEFT ANTECUBITAL Performed at [facility]  [address], [place], [place] [address]    Special Requests   Final    BOTTLES DRAWN AEROBIC AND ANAEROBIC Blood Culture adequate volume Performed at [facility] [address], [place], [place] [address]    Culture   Final    NO GROWTH < 12 HOURS Performed at [facility] [address], [place], [place] [address]    Report Status PENDING  Incomplete  MRSA PCR Screening     Status: None   Collection Time: 08/30/19  8:48 PM   Specimen: Nasal Mucosa; Nasopharyngeal  Result Value Ref Range Status   MRSA by PCR NEGATIVE NEGATIVE Final    Comment:        The GeneXpert MRSA Assay (FDA approved for NASAL specimens only), is one component of a comprehensive MRSA colonization surveillance program. It is not intended to diagnose  MRSA infection nor to guide or monitor treatment for MRSA infections. Performed at [facility], [place] [address] [place], [place] [address]   Sputum Culture     Status: None   Collection Time: 08/30/19  8:48 PM   Specimen: Sputum  Result Value Ref Range Status   Specimen Description SPU  Final   Special Requests NONE  Final   Sputum evaluation   Final    THIS SPECIMEN IS ACCEPTABLE FOR SPUTUM CULTURE Performed at [facility], [place] [address] [place], [place] [address]    Report Status 08/30/2019 FINAL  Final  Culture, respiratory     Status: None (Preliminary result)   Collection Time: 08/30/19  8:48 PM   Specimen: Sputum  Result Value Ref Range Status   Specimen Description   Final    SPU Performed at [place] [address] [place], [place] [address]    Special Requests   Final    NONE Reflexed from [id number] Performed at [facility], [place] [address] [place], [place] [address]    Gram Stain   Final    RARE WBC PRESENT, PREDOMINANTLY PMN RARE GRAM VARIABLE ROD RARE YEAST Performed at [facility], [place] [address] [place], [place] [address]    Culture PENDING  Incomplete   Report Status PENDING  Incomplete  C difficile quick scan w PCR reflex     Status: None   Collection Time: 08/31/19  7:50 AM   Specimen: STOOL  Result Value Ref Range Status   C Diff antigen NEGATIVE NEGATIVE Final   C Diff toxin NEGATIVE NEGATIVE Final   C Diff interpretation No C. difficile detected.  Final    Comment: Performed at [facility], [place] [address] [place], [place] [address]         Radiology Studies: Dg Chest 2 View  Result Date: 08/30/2019 CLINICAL DATA:  Chest tightness and shortness of breath EXAM: CHEST - 2 VIEW COMPARISON:  June 20, 2019 FINDINGS: There is extensive airspace disease throughout much of the right lung and in a more patchy distribution in the left lung.  Heart is borderline enlarged with pulmonary vascularity normal. No adenopathy. No bone lesions. No pneumothorax. IMPRESSION: Multifocal pneumonia bilaterally, more severe on the right than on the left. Borderline cardiomegaly. No evident adenopathy. Electronically Signed   By: Lowella Grip III M.D.   On: 08/30/2019 11:18   Ct Angio Chest Pe W And/or Wo Contrast  Result Date: 08/30/2019 CLINICAL DATA:  Shortness of breath, chest pain EXAM: CT ANGIOGRAPHY CHEST WITH CONTRAST TECHNIQUE: Multidetector CT  imaging of the chest was performed using the standard protocol during bolus administration of intravenous contrast. Multiplanar CT image reconstructions and MIPs were obtained to evaluate the vascular anatomy. CONTRAST:  37mL OMNIPAQUE IOHEXOL 350 MG/ML SOLN COMPARISON:  Plain film earlier today FINDINGS: Cardiovascular: Filling defects are seen within lower lobe pulmonary arteries bilaterally, left greater than right compatible with pulmonary emboli. No evidence of right heart strain. Heart is normal size. Aorta is normal caliber. Mediastinum/Nodes: Scattered mildly prominent mediastinal lymph nodes, likely reactive. Mildly prominent bilateral hilar lymph nodes. No axillary adenopathy. Lungs/Pleura: Moderate left pleural effusion. Extensive bilateral airspace opacities at out of proportion to that expected with the lower lobe pulmonary emboli. This likely reflects multifocal pneumonia. Upper Abdomen: Fatty infiltration of the liver. Prior cholecystectomy. Musculoskeletal: Chest wall soft tissues are unremarkable. No acute bony abnormality. Review of the MIP images confirms the above findings. IMPRESSION: Small bilateral lower lobe pulmonary emboli, left greater than right. No evidence of right heart strain. Moderate left pleural effusion. Extensive bilateral airspace opacities out of proportion to the pulmonary emboli in the lower lobes most compatible with multifocal pneumonia. Fatty infiltration of the  liver. Electronically Signed   By: Rolm Baptise M.D.   On: 08/30/2019 15:27        Scheduled Meds:  atorvastatin  10 mg Oral Daily   escitalopram  10 mg Oral Daily   fluticasone  1 spray Each Nare Daily   lactobacillus  1 g Oral TID WC   pantoprazole  40 mg Oral BID   Rivaroxaban  15 mg Oral BID WC   [START ON 09/18/2019] rivaroxaban  20 mg Oral Q breakfast   sodium chloride flush  3 mL Intravenous Q12H   sodium chloride flush  3 mL Intravenous Q12H   traZODone  50 mg Oral QHS   Continuous Infusions:  sodium chloride     azithromycin     cefTRIAXone (ROCEPHIN)  IV 2 g (08/31/19 1415)     LOS: 1 day    Time spent: 36 minutes spent on chart review, discussion with nursing staff, consultants, updating family and interview/physical exam; more than 50% of that time was spent in counseling and/or coordination of care.    Dalaina Tates J [place] DO [facility] Pager [phone]  If 7PM-7AM, please contact night-coverage [email or web address] Password [facility] 08/31/2019, 2:39 PM

## 2019-09-01 ENCOUNTER — Inpatient Hospital Stay: Payer: 59 | Admitting: Physician Assistant

## 2019-09-01 LAB — BASIC METABOLIC PANEL
Anion gap: 10 (ref 5–15)
BUN: 24 mg/dL — ABNORMAL HIGH (ref 6–20)
CO2: 28 mmol/L (ref 22–32)
Calcium: 8.4 mg/dL — ABNORMAL LOW (ref 8.9–10.3)
Chloride: 106 mmol/L (ref 98–111)
Creatinine, Ser: 1.54 mg/dL — ABNORMAL HIGH (ref 0.61–1.24)
GFR calc Af Amer: >60 mL/min (ref >60)
GFR calc non Af Amer: 53 mL/min — ABNORMAL LOW (ref >60)
Glucose, Bld: 111 mg/dL — ABNORMAL HIGH (ref 70–99)
Potassium: 3.4 mmol/L — ABNORMAL LOW (ref 3.5–5.1)
Sodium: 144 mmol/L (ref 135–145)

## 2019-09-01 LAB — CBC
HCT: 36.2 % — ABNORMAL LOW (ref 39.0–52.0)
Hemoglobin: 11.9 g/dL — ABNORMAL LOW (ref 13.0–17.0)
MCH: 30.9 pg (ref 26.0–34.0)
MCHC: 32.9 g/dL (ref 30.0–36.0)
MCV: 94 fL (ref 80.0–100.0)
Platelets: 199 10*3/uL (ref 150–400)
RBC: 3.85 MIL/uL — ABNORMAL LOW (ref 4.22–5.81)
RDW: 12.9 % (ref 11.5–15.5)
WBC: 8.8 10*3/uL (ref 4.0–10.5)
nRBC: 0 % (ref 0.0–0.2)

## 2019-09-01 LAB — MAGNESIUM: Magnesium: 1.7 mg/dL (ref 1.7–2.4)

## 2019-09-01 MED ORDER — MAGNESIUM SULFATE 2 GM/50ML IV SOLN
2.0000 g | Freq: Once | INTRAVENOUS | Status: AC
  Administered 2019-09-01: 2 g via INTRAVENOUS
  Filled 2019-09-01: qty 50

## 2019-09-01 MED ORDER — POTASSIUM CHLORIDE CRYS ER 20 MEQ PO TBCR
40.0000 meq | EXTENDED_RELEASE_TABLET | Freq: Once | ORAL | Status: AC
  Administered 2019-09-01: 40 meq via ORAL
  Filled 2019-09-01: qty 2

## 2019-09-01 NOTE — Progress Notes (Signed)
PROGRESS NOTE    Wesley Hancock  [id number] DOB: 1970-12-28 DOA: 08/30/2019 PCP: Inda Coke, PA   Brief Narrative:   Wesley Hancock is a 48 year old Caucasian gentleman with past medical history remarkable for essential hypertension, GERD, depression, HLD, and recently diagnosed with PE/DVT on Xarelto, who presented to the [facility] emergency department with progressive shortness of breath.   Patient was hospitalized 9/22--9/27 for acute bilateral pulmonary emboli, lower extremity DVT, bilateral pneumonia, acute hypoxic respiratory failure and acute kidney injury.  He was discharged on Xarelto, Levaquin, steroid taper and 2 L nasal cannula.  Acute kidney injury thought multifactorial including contrast injury, ARB, diuretic and Toradol.  Initially felt well when he went home but yesterday noted increasing shortness of breath and oxygen saturations in the 80s.  Breathing status continued to worsen and so he came to the emergency department today for evaluation.  He has had some chest pain on the left side from the PE.  No specific aggravating or alleviating factors.  He has been compliant with Xarelto and antibiotic.  No fever at home.  CT showed bilateral pneumonia, bilateral peripheral PE.  Treated with ceftriaxone and azithromycin and referred for admission.  Assessment & Plan:   Principal Problem:   Lobar pneumonia ([place]) Active Problems:   Bilateral pneumonia   Acute hypoxemic respiratory failure ([place])   AKI (acute kidney injury) ([place])   Pulmonary emboli ([name])   DVT (deep venous thrombosis) ([name])   Acute hypoxic respiratory failure Multifocal pneumonia Bilateral pulmonary embolism Patient presenting to ED with progressive shortness of breath.  Recently discharged from outside hospital with new diagnosis of PE/DVT and pneumonia.  Patient reports compliance with his home Xarelto, and was discharged on 2 L nasal cannula.  BNP slightly elevated at 530.  WBC count  elevated 14.7 on admission.  Troponin flat at 23 followed by 21.  Recent echocardiogram on 08/24/2019 notable for LVEF 55-65%.  CTA PE notable for extensive bilateral airway opacities out of proportion in regards to his pulmonary embolism consistent with multifocal pneumonia.  MRSA PCR negative. --Sputum culture: With rare gram variable rods --Blood cultures x2: No growth x 2 days --Continue empiric antibiotics with azithromycin and ceftriaxone --Titrate supplemental oxygen to maintain SPO2 greater than 92% --Attempt 6-minute walk test again today; patient yesterday desaturated to 86% while ambulating --Continue incentive spirometry --Supportive care  Bilateral pulmonary embolism DVT --Continue Xarelto  Acute renal failure Most recent creatinine 2.47 on outside hospital discharge on 08/27/2019. Acute kidney injury thought multifactorial including contrast injury, ARB, diuretic and Toradol.  Also did have a contrast enhanced CTA on this hospitalization in the ED. --Cr 1.42-->1.30-->1.52-->1.54 --Holding home ARB/HCTZ --Avoid nephrotoxins --Renally dose all medications --Follow BMP daily  GERD: Continue PPI  Hypokalemia Hypomagnesemia Potassium 3.4, magnesium 1.7; will replete. --Repeat electrolytes in the a.m.  Depression: Continue Lexapro  Diarrhea --C. difficile negative on admission --Start probiotic while on antibiotics as above --Imodium as needed  Insomnia: Trazodone 50 mg p.o. nightly  HLD: Continue statin  Essential hypertension: --Holding home ARB/HCTZ in the setting of acute renal failure --Blood pressure reasonable --Continue monitor blood pressure closely, may need alternative agent on discharge for hypertension control  DVT prophylaxis: SCDs Code Status: Full code Family Communication: None Disposition Plan: Continue inpatient, IV antibiotics, attempt to titrate off of supplemental oxygen, further dependent on clinical course; hopeful for discharge home in 1-2  days   Consultants:   None  Procedures:   None  Antimicrobials:  Azithromycin 9/30  Ceftriaxone 9/30  Subjective: Patient seen and examined at bedside, resting comfortably.  Continues with shortness of breath.  Requiring supplemental oxygen.  Reports becomes extremely winded on ambulation, noted to have hypoxia off her room air yesterday down to 86%.  No other concerns or complaints at this time.  Denies headache, no fever/chills/night sweats, no chest pain, no palpitations, no abdominal pain, no weakness, no paresthesias.  No acute events overnight per nursing staff.  Objective: Vitals:   08/31/19 1452 08/31/19 2119 09/01/19 0609 09/01/19 1255  BP: (!) 142/89 (!) 134/95 (!) 142/88 (!) 155/97  Pulse: 64 71 70 91  Resp: 18 17 (!) 24 18  Temp: 98.1 F (36.7 C) 98.9 F (37.2 C) 98.9 F (37.2 C) 99 F (37.2 C)  TempSrc: Oral Oral Oral Oral  SpO2: 92% 94% 95% 95%  Weight:      Height:        Intake/Output Summary (Last 24 hours) at 09/01/2019 1629 Last data filed at 09/01/2019 1500 Gross per 24 hour  Intake 910.43 ml  Output 2 ml  Net 908.43 ml   Filed Weights   08/30/19 1042  Weight: 127 kg    Examination:  General exam: Appears calm and comfortable  Respiratory system: Breath sounds slightly decreased bilateral bases, no wheezes. Respiratory effort normal.  Continues on 2 L nasal cannula oxygenating 95%. Cardiovascular system: S1 & S2 heard, RRR. No JVD, murmurs, rubs, gallops or clicks. No pedal edema. Gastrointestinal system: Abdomen is nondistended, soft and nontender. No organomegaly or masses felt. Normal bowel sounds heard. Central nervous system: Alert and oriented. No focal neurological deficits. Extremities: Symmetric 5 x 5 power. Skin: No rashes, lesions or ulcers Psychiatry: Judgement and insight appear normal. Mood & affect appropriate.     Data Reviewed: I have personally reviewed following labs and imaging studies  CBC: Recent Labs  Lab  08/30/19 1244 08/30/19 1329 08/31/19 0524 09/01/19 0509  WBC >14.7*  --  9.1 8.8  HGB 12.4* 12.9* 10.8* 11.9*  HCT 37.2* 38.0* 33.6* 36.2*  MCV 92.3  --  94.1 94.0  PLT 203  --  199 [id number]   Basic Metabolic Panel: Recent Labs  Lab 08/30/19 1244 08/30/19 1329 08/31/19 0524 09/01/19 0509  NA 140 142 139 144  K 3.6 3.7 3.1* 3.4*  CL 105 103 106 106  CO2 25  --  25 28  GLUCOSE 104* 97 91 111*  BUN 28* 25* 28* 24*  CREATININE 1.42* 1.30* 1.52* 1.54*  CALCIUM 8.5*  --  8.2* 8.4*  MG  --   --   --  1.7   GFR: Estimated Creatinine Clearance: 82.8 mL/min (A) (by C-G formula based on SCr of 1.54 mg/dL (H)). Liver Function Tests: No results for input(s): AST, ALT, ALKPHOS, BILITOT, PROT, ALBUMIN in the last 168 hours. No results for input(s): LIPASE, AMYLASE in the last 168 hours. No results for input(s): AMMONIA in the last 168 hours. Coagulation Profile: No results for input(s): INR, PROTIME in the last 168 hours. Cardiac Enzymes: No results for input(s): CKTOTAL, CKMB, CKMBINDEX, TROPONINI in the last 168 hours. BNP (last 3 results) No results for input(s): PROBNP in the last 8760 hours. HbA1C: No results for input(s): HGBA1C in the last 72 hours. CBG: No results for input(s): GLUCAP in the last 168 hours. Lipid Profile: No results for input(s): CHOL, HDL, LDLCALC, TRIG, CHOLHDL, LDLDIRECT in the last 72 hours. Thyroid Function Tests: No results for input(s): TSH, T4TOTAL, FREET4, T3FREE, THYROIDAB in the last 72 hours. Anemia  Panel: No results for input(s): VITAMINB12, FOLATE, FERRITIN, TIBC, IRON, RETICCTPCT in the last 72 hours. Sepsis Labs: No results for input(s): PROCALCITON, LATICACIDVEN in the last 168 hours.  Recent Results (from the past 240 hour(s))  SARS Coronavirus 2 [facility] order, Performed in [facility] hospital lab) Nasopharyngeal Nasopharyngeal Swab     Status: None   Collection Time: 08/30/19  3:48 PM   Specimen: Nasopharyngeal Swab  Result Value Ref  Range Status   SARS Coronavirus 2 NEGATIVE NEGATIVE Final    Comment: (NOTE) If result is NEGATIVE SARS-CoV-2 target nucleic acids are NOT DETECTED. The SARS-CoV-2 RNA is generally detectable in upper and lower  respiratory specimens during the acute phase of infection. The lowest  concentration of SARS-CoV-2 viral copies this assay can detect is 250  copies / mL. A negative result does not preclude SARS-CoV-2 infection  and should not be used as the sole basis for treatment or other  patient management decisions.  A negative result may occur with  improper specimen collection / handling, submission of specimen other  than nasopharyngeal swab, presence of viral mutation(s) within the  areas targeted by this assay, and inadequate number of viral copies  (<250 copies / mL). A negative result must be combined with clinical  observations, patient history, and epidemiological information. If result is POSITIVE SARS-CoV-2 target nucleic acids are DETECTED. The SARS-CoV-2 RNA is generally detectable in upper and lower  respiratory specimens dur ing the acute phase of infection.  Positive  results are indicative of active infection with SARS-CoV-2.  Clinical  correlation with patient history and other diagnostic information is  necessary to determine patient infection status.  Positive results do  not rule out bacterial infection or co-infection with other viruses. If result is PRESUMPTIVE POSTIVE SARS-CoV-2 nucleic acids MAY BE PRESENT.   A presumptive positive result was obtained on the submitted specimen  and confirmed on repeat testing.  While 2019 novel coronavirus  (SARS-CoV-2) nucleic acids may be present in the submitted sample  additional confirmatory testing may be necessary for epidemiological  and / or clinical management purposes  to differentiate between  SARS-CoV-2 and other Sarbecovirus currently known to infect humans.  If clinically indicated additional testing with an  alternate test  methodology [phone]) is advised. The SARS-CoV-2 RNA is generally  detectable in upper and lower respiratory sp ecimens during the acute  phase of infection. The expected result is Negative. Fact Sheet for Patients:  StrictlyIdeas.no Fact Sheet for Healthcare Providers: BankingDealers.co.za This test is not yet approved or cleared by the [place] FDA and has been authorized for detection and/or diagnosis of SARS-CoV-2 by FDA under an Emergency Use Authorization (EUA).  This EUA will remain in effect (meaning this test can be used) for the duration of the COVID-19 declaration under Section 564(b)(1) of the Act, 21 U.S.C. section 360bbb-3(b)(1), unless the authorization is terminated or revoked sooner. Performed at [facility], [place] [address], [place], [place] [address]   Culture, blood (Routine X 2) w Reflex to ID Panel     Status: None (Preliminary result)   Collection Time: 08/30/19  3:50 PM   Specimen: BLOOD  Result Value Ref Range Status   Specimen Description   Final    BLOOD RIGHT ANTECUBITAL Performed at [facility] [address], [place], [place] [address]    Special Requests   Final    BOTTLES DRAWN AEROBIC AND ANAEROBIC Blood Culture adequate volume Performed at [facility],  [place] [address] [place], [place] [address]    Culture   Final    NO GROWTH 2 DAYS Performed at [facility] [address], [place], [place] [address]    Report Status PENDING  Incomplete  Culture, blood (Routine X 2) w Reflex to ID Panel     Status: None (Preliminary result)   Collection Time: 08/30/19  4:07 PM   Specimen: BLOOD  Result Value Ref Range Status   Specimen Description   Final    BLOOD LEFT ANTECUBITAL Performed at [facility] [address], [place], [place] [address]    Special Requests   Final    BOTTLES DRAWN AEROBIC AND  ANAEROBIC Blood Culture adequate volume Performed at [facility] [address] [place], [place] [address]    Culture   Final    NO GROWTH 2 DAYS Performed at [facility] [address], [place], [place] [address]    Report Status PENDING  Incomplete  MRSA PCR Screening     Status: None   Collection Time: 08/30/19  8:48 PM   Specimen: Nasal Mucosa; Nasopharyngeal  Result Value Ref Range Status   MRSA by PCR NEGATIVE NEGATIVE Final    Comment:        The GeneXpert MRSA Assay (FDA approved for NASAL specimens only), is one component of a comprehensive MRSA colonization surveillance program. It is not intended to diagnose MRSA infection nor to guide or monitor treatment for MRSA infections. Performed at [facility], [place] [address], [place], [place] [address]   Sputum Culture     Status: None   Collection Time: 08/30/19  8:48 PM   Specimen: Sputum  Result Value Ref Range Status   Specimen Description SPU  Final   Special Requests NONE  Final   Sputum evaluation   Final    THIS SPECIMEN IS ACCEPTABLE FOR SPUTUM CULTURE Performed at [facility], [place] [address], [place], [place] [address]    Report Status 08/30/2019 FINAL  Final  Culture, respiratory     Status: None (Preliminary result)   Collection Time: 08/30/19  8:48 PM   Specimen: Sputum  Result Value Ref Range Status   Specimen Description   Final    SPU Performed at [facility] [address] [place], [place] [address]    Special Requests   Final    NONE Reflexed from [id number] Performed at [facility], [place] [address] [place], [place] [address]    Gram Stain   Final    RARE WBC PRESENT, PREDOMINANTLY PMN RARE GRAM VARIABLE ROD RARE YEAST    Culture   Final    CULTURE REINCUBATED FOR BETTER GROWTH Performed at [facility], [place] [address] [place], [place] [address]    Report Status PENDING   Incomplete  C difficile quick scan w PCR reflex     Status: None   Collection Time: 08/31/19  7:50 AM   Specimen: STOOL  Result Value Ref Range Status   C Diff antigen NEGATIVE NEGATIVE Final   C Diff toxin NEGATIVE NEGATIVE Final   C Diff interpretation No C. difficile detected.  Final    Comment: Performed at [facility], [place] [address], [place],  [address]         Radiology Studies: No results found.      Scheduled Meds:  atorvastatin  10 mg Oral Daily   escitalopram  10 mg Oral Daily   fluticasone  1 spray Each Nare Daily   lactobacillus  1 g Oral TID WC   pantoprazole  40 mg Oral BID   Rivaroxaban  15 mg Oral BID WC   [START ON 09/18/2019] rivaroxaban  20 mg Oral Q breakfast   sodium chloride flush  3 mL Intravenous Q12H   sodium chloride flush  3 mL Intravenous Q12H   traZODone  50 mg Oral QHS   Continuous Infusions:  sodium chloride     azithromycin 500 mg (09/01/19 1604)   cefTRIAXone (ROCEPHIN)  IV 200 mL/hr at 09/01/19 1500     LOS: 2 days    Time spent: 36 minutes spent on chart review, discussion with nursing staff, consultants, updating family and interview/physical exam; more than 50% of that time was spent in counseling and/or coordination of care.    Ghadeer Kastelic J [place] DO [facility] Pager [phone]  If 7PM-7AM, please contact night-coverage [email or web address] Password [facility] 09/01/2019, 4:29 PM

## 2019-09-02 LAB — BASIC METABOLIC PANEL
Anion gap: 6 (ref 5–15)
BUN: 21 mg/dL — ABNORMAL HIGH (ref 6–20)
CO2: 28 mmol/L (ref 22–32)
Calcium: 8.2 mg/dL — ABNORMAL LOW (ref 8.9–10.3)
Chloride: 109 mmol/L (ref 98–111)
Creatinine, Ser: 1.49 mg/dL — ABNORMAL HIGH (ref 0.61–1.24)
GFR calc Af Amer: >60 mL/min (ref >60)
GFR calc non Af Amer: 55 mL/min — ABNORMAL LOW (ref >60)
Glucose, Bld: 104 mg/dL — ABNORMAL HIGH (ref 70–99)
Potassium: 3.3 mmol/L — ABNORMAL LOW (ref 3.5–5.1)
Sodium: 143 mmol/L (ref 135–145)

## 2019-09-02 LAB — CULTURE, RESPIRATORY W GRAM STAIN: Culture: NORMAL

## 2019-09-02 LAB — MAGNESIUM: Magnesium: 2.1 mg/dL (ref 1.7–2.4)

## 2019-09-02 MED ORDER — POTASSIUM CHLORIDE CRYS ER 20 MEQ PO TBCR
40.0000 meq | EXTENDED_RELEASE_TABLET | ORAL | Status: AC
  Administered 2019-09-02 (×2): 40 meq via ORAL
  Filled 2019-09-02 (×2): qty 2

## 2019-09-02 MED ORDER — AZITHROMYCIN 500 MG PO TABS: 500.0000 mg | ORAL_TABLET | Freq: Every day | ORAL | 0 refills | Status: AC

## 2019-09-02 MED ORDER — CEFDINIR 300 MG PO CAPS: 300.0000 mg | ORAL_CAPSULE | Freq: Two times a day (BID) | ORAL | 0 refills | Status: AC

## 2019-09-02 NOTE — Discharge Instructions (Signed)
Community-Acquired Pneumonia, Adult Pneumonia is a type of lung infection that causes swelling in the airways of the lungs. Mucus and fluid may also build up inside the airways. This may cause coughing and difficulty breathing. There are different types of pneumonia. One type can develop while a person is in a hospital. A different type is called community-acquired pneumonia. It develops in people who are not, and have not recently been, in the hospital or another type of health care facility. What are the causes? This condition may be caused by:  Viruses. This is the most common cause of pneumonia.  Bacteria. Community-acquired pneumonia is often caused by Streptococcus pneumoniae bacteria. These bacteria are often passed from one person to another by breathing in droplets from the cough or sneeze of an infected person.  Fungi. This is the least common cause of pneumonia. What increases the risk? The following factors may make you more likely to develop this condition:  Having a chronic disease, such as chronic obstructive pulmonary disease (COPD), asthma, congestive heart failure, cystic fibrosis, diabetes, or kidney disease.  Having early-stage or late-stage HIV.  Having sickle cell disease.  Having had your spleen removed (splenectomy).  Having poor dental hygiene.  Having a medical condition that increases the risk of breathing in (aspirating) secretions from your own mouth and nose.  Having a weakened body defense system (immune system).  Being a smoker.  Traveling to areas where pneumonia-causing germs commonly exist.  Being around animal habitats or animals that have pneumonia-causing germs, including birds, bats, rabbits, cats, and farm animals. What are the signs or symptoms? Symptoms of this condition include:  A dry cough.  A wet (productive) cough.  Fever.  Sweating.  Chest pain, especially when breathing deeply or coughing.  Rapid breathing or difficulty  breathing.  Shortness of breath.  Shaking chills.  Fatigue.  Muscle aches. How is this diagnosed? This condition may be diagnosed based on:  Your medical history.  A physical exam. You may also have tests, including:  Chest X-rays.  Tests of your blood oxygen level and other blood gases.  Tests on blood, mucus (sputum), fluid around your lungs (pleural fluid), and urine. If your pneumonia is severe, other tests may be done to find the exact cause of your illness. How is this treated? Treatment for this condition depends on many factors, such as the cause of your pneumonia, the medicines you take, and other medical conditions that you have. For most adults, treatment and recovery from pneumonia may occur at home. In some cases, treatment must happen in a hospital. Treatment may include:  Medicines that are given by mouth or through an IV, including: ? Antibiotic medicines, if the pneumonia was caused by bacteria. ? Antiviral medicines, if the pneumonia was caused by a virus.  Being given extra oxygen.  Respiratory therapy. Although rare, treating severe pneumonia may include:  Using a machine to help you breathe (mechanical ventilation). This is done if you are not breathing well on your own and you cannot maintain a safe blood oxygen level.  Thoracentesis. This is a procedure to remove fluid from around one lung or both lungs to help you breathe better. Follow these instructions at home:  Medicines  Take over-the-counter and prescription medicines only as told by your health care provider. ? Only take cough medicine if you are losing sleep. Be aware that cough medicine can prevent your body's natural ability to remove mucus from your lungs.  If you were prescribed an antibiotic  medicine, take it as told by your health care provider. Do not stop taking the antibiotic even if you start to feel better. General instructions  Sleep in a semi-upright position at night. Try  sleeping in a reclining chair, or place a few pillows under your head.  Rest as needed and get at least 8 hours of sleep each night.  Drink enough water to keep your urine pale yellow. This will help to thin out mucus secretions in your lungs.  Eat a healthy diet that includes plenty of vegetables, fruits, whole grains, low-fat dairy products, and lean protein.  Do not use any products that contain nicotine or tobacco, such as cigarettes, e-cigarettes, and chewing tobacco. If you need help quitting, ask your health care provider.  Keep all follow-up visits as told by your health care provider. This is important. How is this prevented? You can lower your risk of developing community-acquired pneumonia by:  Getting a pneumococcal vaccine. There are different types and schedules of pneumococcal vaccines. Ask your health care provider which option is best for you. Consider getting the vaccine if: ? You are older than 47 years of age. ? You are older than 47 years of age and are undergoing cancer treatment, have chronic lung disease, or have other medical conditions that affect your immune system. Ask your health care provider if this applies to you.  Getting an influenza vaccine every year. Ask your health care provider which type of vaccine is best for you.  Getting regular checkups from your dentist.  Washing your hands often. If soap and water are not available, use hand sanitizer. Contact a health care provider if:  You have a fever.  You are losing sleep because you cannot control your cough with cough medicine. Get help right away if:  You have worsening shortness of breath.  You have increased chest pain.  Your sickness becomes worse, especially if you are an older adult or have a weakened immune system.  You cough up blood. Summary  Pneumonia is an infection of the lungs.  Community-acquired pneumonia develops in people who have not been in the hospital. It can be caused  by bacteria, viruses, or fungi.  This condition may be treated with antibiotics or antiviral medicines.  Severe cases may require hospitalization, mechanical ventilation, and other procedures to drain fluid from the lungs. This information is not intended to replace advice given to you by your health care provider. Make sure you discuss any questions you have with your health care provider. Document Released: 11/16/2005 Document Revised: 07/14/2018 Document Reviewed: 07/14/2018 Elsevier Patient Education  [place].  Deep Vein Thrombosis  Deep vein thrombosis (DVT) is a condition in which a blood clot forms in a deep vein, such as a lower leg, thigh, or arm vein. A clot is blood that has thickened into a gel or solid. This condition is dangerous. It can lead to serious and even life-threatening complications if the clot travels to the lungs and causes a blockage (pulmonary embolism). It can also damage veins in the leg. This can result in leg pain, swelling, discoloration, and sores (post-thrombotic syndrome). What are the causes? This condition may be caused by:  A slowdown of blood flow.  Damage to a vein.  A condition that causes blood to clot more easily, such as an inherited clotting disorder. What increases the risk? The following factors may make you more likely to develop this condition:  Being overweight.  Being older, especially over age  60.  Sitting or lying down for more than four hours.  Being in the hospital.  Lack of physical activity (sedentary lifestyle).  Pregnancy, being in childbirth, or having recently given birth.  Taking medicines that contain estrogen, such as medicines to prevent pregnancy.  Smoking.  A history of any of the following: ? Blood clots or a blood clotting disease. ? Peripheral vascular disease. ? Inflammatory bowel disease. ? Cancer. ? Heart disease. ? Genetic conditions that affect how your blood clots, such as Factor V  Leiden mutation. ? Neurological diseases that affect your legs (leg paresis). ? A recent injury, such as a car accident. ? Major or lengthy surgery. ? A central line placed inside a large vein. What are the signs or symptoms? Symptoms of this condition include:  Swelling, pain, or tenderness in an arm or leg.  Warmth, redness, or discoloration in an arm or leg. If the clot is in your leg, symptoms may be more noticeable or worse when you stand or walk. Some people may not develop any symptoms. How is this diagnosed? This condition is diagnosed with:  A medical history and physical exam.  Tests, such as: ? Blood tests. These are done to check how well your blood clots. ? Ultrasound. This is done to check for clots. ? Venogram. For this test, contrast dye is injected into a vein and X-rays are taken to check for any clots. How is this treated? Treatment for this condition depends on:  The cause of your DVT.  Your risk for bleeding or developing more clots.  Any other medical conditions that you have. Treatment may include:  Taking a blood thinner (anticoagulant). This type of medicine prevents clots from forming. It may be taken by mouth, injected under the skin, or injected through an IV (catheter).  Injecting clot-dissolving medicines into the affected vein (catheter-directed thrombolysis).  Having surgery. Surgery may be done to: ? Remove the clot. ? Place a filter in a large vein to catch blood clots before they reach the lungs. Some treatments may be continued for up to six months. Follow these instructions at home: If you are taking blood thinners:  Take the medicine exactly as told by your health care provider. Some blood thinners need to be taken at the same time every day. Do not skip a dose.  Talk with your health care provider before you take any medicines that contain aspirin or NSAIDs. These medicines increase your risk for dangerous bleeding.  Ask your health  care provider about foods and drugs that could change the way the medicine works (may interact). Avoid those things if your health care provider tells you to do so.  Blood thinners can cause easy bruising and may make it difficult to stop bleeding. Because of this: ? Be very careful when using knives, scissors, or other sharp objects. ? Use an electric razor instead of a blade. ? Avoid activities that could cause injury or bruising, and follow instructions about how to prevent falls.  Wear a medical alert bracelet or carry a card that lists what medicines you take. General instructions  Take over-the-counter and prescription medicines only as told by your health care provider.  Return to your normal activities as told by your health care provider. Ask your health care provider what activities are safe for you.  Wear compression stockings if recommended by your health care provider.  Keep all follow-up visits as told by your health care provider. This is important. How is this  prevented? To lower your risk of developing this condition again:  For 30 or more minutes every day, do an activity that: ? Involves moving your arms and legs. ? Increases your heart rate.  When traveling for longer than four hours: ? Exercise your arms and legs every hour. ? Drink plenty of water. ? Avoid drinking alcohol.  Avoid sitting or lying for a long time without moving your legs.  If you have surgery or you are hospitalized, ask about ways to prevent blood clots. These may include taking frequent walks or using anticoagulants.  Stay at a healthy weight.  If you are a woman who is older than age 54, avoid unnecessary use of medicines that contain estrogen, such as some birth control pills.  Do not use any products that contain nicotine or tobacco, such as cigarettes and e-cigarettes. This is especially important if you take estrogen medicines. If you need help quitting, ask your health care  provider. Contact a health care provider if:  You miss a dose of your blood thinner.  Your menstrual period is heavier than usual.  You have unusual bruising. Get help right away if:  You have: ? New or increased pain, swelling, or redness in an arm or leg. ? Numbness or tingling in an arm or leg. ? Shortness of breath. ? Chest pain. ? A rapid or irregular heartbeat. ? A severe headache or confusion. ? A cut that will not stop bleeding.  There is blood in your vomit, stool, or urine.  You have a serious fall or accident, or you hit your head.  You feel light-headed or dizzy.  You cough up blood. These symptoms may represent a serious problem that is an emergency. Do not wait to see if the symptoms will go away. Get medical help right away. Call your local emergency services (911 in the [place] Do not drive yourself to the hospital. Summary  Deep vein thrombosis (DVT) is a condition in which a blood clot forms in a deep vein, such as a lower leg, thigh, or arm vein.  Symptoms can include swelling, warmth, pain, and redness in your leg or arm.  This condition may be treated with a blood thinner (anticoagulant medicine), medicine that is injected to dissolve blood clots,compression stockings, or surgery.  If you are prescribed blood thinners, take them exactly as told. This information is not intended to replace advice given to you by your health care provider. Make sure you discuss any questions you have with your health care provider. Document Released: 11/16/2005 Document Revised: 10/29/2017 Document Reviewed: 04/16/2017 Elsevier Patient Education  [place].  Pulmonary Embolism  A pulmonary embolism (PE) is a sudden blockage or decrease of blood flow in one or both lungs. Most blockages come from a blood clot that forms in the vein of a lower leg, thigh, or arm (deep vein thrombosis, DVT) and travels to the lungs. A clot is blood that has thickened into a gel or  solid. PE is a dangerous and life-threatening condition that needs to be treated right away. What are the causes? This condition is usually caused by a blood clot that forms in a vein and moves to the lungs. In rare cases, it may be caused by air, fat, part of a tumor, or other tissue that moves through the veins and into the lungs. What increases the risk? The following factors may make you more likely to develop this condition:  Experiencing a traumatic injury, such as breaking a  hip or leg.  Having: ? A spinal cord injury. ? Orthopedic surgery, especially hip or knee replacement. ? Any major surgery. ? A stroke. ? DVT. ? Blood clots or blood clotting disease. ? Long-term (chronic) lung or heart disease. ? Cancer treated with chemotherapy. ? A central venous catheter.  Taking medicines that contain estrogen. These include birth control pills and hormone replacement therapy.  Being: ? Pregnant. ? In the period of time after your baby is delivered (postpartum). ? Older than age 69. ? Overweight. ? A smoker, especially if you have other risks. What are the signs or symptoms? Symptoms of this condition usually start suddenly and include:  Shortness of breath during activity or at rest.  Coughing, coughing up blood, or coughing up blood-tinged mucus.  Chest pain that is often worse with deep breaths.  Rapid or irregular heartbeat.  Feeling light-headed or dizzy.  Fainting.  Feeling anxious.  Fever.  Sweating.  Pain and swelling in a leg. This is a symptom of DVT, which can lead to PE. How is this diagnosed? This condition may be diagnosed based on:  Your medical history.  A physical exam.  Blood tests.  CT pulmonary angiogram. This test checks blood flow in and around your lungs.  Ventilation-perfusion scan, also called a lung VQ scan. This test measures air flow and blood flow to the lungs.  An ultrasound of the legs. How is this treated? Treatment for  this condition depends on many factors, such as the cause of your PE, your risk for bleeding or developing more clots, and other medical conditions you have. Treatment aims to remove, dissolve, or stop blood clots from forming or growing larger. Treatment may include:  Medicines, such as: ? Blood thinning medicines (anticoagulants) to stop clots from forming. ? Medicines that dissolve clots (thrombolytics).  Procedures, such as: ? Using a flexible tube to remove a blood clot (embolectomy) or to deliver medicine to destroy it (catheter-directed thrombolysis). ? Inserting a filter into a large vein that carries blood to the heart (inferior vena cava). This filter (vena cava filter) catches blood clots before they reach the lungs. ? Surgery to remove the clot (surgical embolectomy). This is rare. You may need a combination of immediate, long-term (up to 3 months after diagnosis), and extended (more than 3 months after diagnosis) treatments. Your treatment may continue for several months (maintenance therapy). You and your health care provider will work together to choose the treatment program that is best for you. Follow these instructions at home: Medicines  Take over-the-counter and prescription medicines only as told by your health care provider.  If you are taking an anticoagulant medicine: ? Take the medicine every day at the same time each day. ? Understand what foods and drugs interact with your medicine. ? Understand the side effects of this medicine, including excessive bruising or bleeding. Ask your health care provider or pharmacist about other side effects. General instructions  Wear a medical alert bracelet or carry a medical alert card that says you have had a PE and lists what medicines you take.  Ask your health care provider when you may return to your normal activities. Avoid sitting or lying for a long time without moving.  Maintain a healthy weight. Ask your health care  provider what weight is healthy for you.  Do not use any products that contain nicotine or tobacco, such as cigarettes, e-cigarettes, and chewing tobacco. If you need help quitting, ask your health care provider.  Talk  with your health care provider about any travel plans. It is important to make sure that you are still able to take your medicine while on trips.  Keep all follow-up visits as told by your health care provider. This is important. Contact a health care provider if:  You missed a dose of your blood thinner medicine. Get help right away if:  You have: ? New or increased pain, swelling, warmth, or redness in an arm or leg. ? Numbness or tingling in an arm or leg. ? Shortness of breath during activity or at rest. ? A fever. ? Chest pain. ? A rapid or irregular heartbeat. ? A severe headache. ? Vision changes. ? A serious fall or accident, or you hit your head. ? Stomach (abdominal) pain. ? Blood in your vomit, stool, or urine. ? A cut that will not stop bleeding.  You cough up blood.  You feel light-headed or dizzy.  You cannot move your arms or legs.  You are confused or have memory loss. These symptoms may represent a serious problem that is an emergency. Do not wait to see if the symptoms will go away. Get medical help right away. Call your local emergency services (911 in the [place] Do not drive yourself to the hospital. Summary  A pulmonary embolism (PE) is a sudden blockage or decrease of blood flow in one or both lungs. PE is a dangerous and life-threatening condition that needs to be treated right away.  Treatments for this condition usually include medicines to thin your blood (anticoagulants) or medicines to break apart blood clots (thrombolytics).  If you are given blood thinners, it is important to take the medicine every day at the same time each day.  Understand what foods and drugs interact with any medicines that you are taking.  If you have signs  of PE or DVT, call your local emergency services (911 in the [place] This information is not intended to replace advice given to you by your health care provider. Make sure you discuss any questions you have with your health care provider. Document Released: 11/13/2000 Document Revised: 08/24/2018 Document Reviewed: 08/24/2018 Elsevier Patient Education  2020 [employer].

## 2019-09-02 NOTE — Evaluation (Signed)
Physical Therapy Evaluation and discharge Patient Details Name: Wesley Hancock MRN: [id number] DOB: 04/16/1971 Today's Date: 09/02/2019   History of Present Illness  48 y/o admitted with PNA.  Recently diagnosed with PE and on Xarelto.  Clinical Impression  Pt independent with all mobility.  His o2 dropped briefly to 88% on room air after 150' of ambulation.  He stopped took a brief standing break with deep breathing and it increased to 91% quickly. Pt then ambulated another 350' with o2 92-96% on room air. Education provided on mobility with work, energy conservation, and use of his o2 monitor.  All education complete. Will d/c pt from PT at this time.    Follow Up Recommendations No PT follow up    Equipment Recommendations  None recommended by PT    Recommendations for Other Services       Precautions / Restrictions Precautions Precautions: Other (comment) Precaution Comments: monitor o2      Mobility  Bed Mobility Overal bed mobility: Independent                Transfers Overall transfer level: Independent                  Ambulation/Gait Ambulation/Gait assistance: Independent       Gait velocity: WNL   General Gait Details: amb in room without AD.  Ambulated in hallway pushing dynamap for o2 monitoring.  Ambulated 500' total. O2 in 90's except for brief episode around 150' when it dropped to 88% and pt stopped and did some deep breathing and it rebounded quickly to 91%.  O2 up to 96% at times.  Stairs            Wheelchair Mobility    Modified Rankin (Stroke Patients Only)       Balance Overall balance assessment: Independent                                           Pertinent Vitals/Pain Pain Assessment: No/denies pain    Home Living Family/patient expects to be discharged to:: Private residence Living Arrangements: Spouse/significant other Available Help at Discharge: Family Type of Home: House Home  Access: Stairs to enter   Technical brewer of Steps: 1 Home Layout: One level Home Equipment: Shower seat - built in;Other (comment) Additional Comments: home o2    Prior Function Level of Independence: Independent         Comments: Works as Engineer, drilling, 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 [employer]        Extremity/Trunk Assessment   Upper Extremity Assessment Upper Extremity Assessment: Overall WFL for tasks assessed    Lower Extremity Assessment Lower Extremity Assessment: Overall WFL for tasks assessed       Communication   Communication: No difficulties  Cognition Arousal/Alertness: Awake/alert Behavior During Therapy: WFL for tasks assessed/performed Overall Cognitive Status: Within Functional Limits for tasks assessed                                        General Comments General comments (skin integrity, edema, etc.): Pt provided with education on energy conservation for home and eventual return to work. Discussed use of his o2 sat monitor he has at home. 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 Pt verbalized understanding    Exercises     Assessment/Plan    PT Assessment Patent does not need any further PT services  PT Problem List         PT Treatment Interventions      PT Goals (Current goals can be found in the Care Plan section)  Acute Rehab PT Goals Patient Stated Goal: home PT Goal Formulation: All assessment and education complete, DC therapy    Frequency     Barriers to discharge        Co-evaluation               AM-PAC PT "6 Clicks" Mobility  Outcome Measure Help needed turning from your back to your side while in a flat bed without using bedrails?: None Help needed moving from lying on your back to sitting on the side of a flat bed without using bedrails?: None Help needed moving to and from a bed to a chair (including a  wheelchair)?: None Help needed standing up from a chair using your arms (e.g., wheelchair or bedside chair)?: None Help needed to walk in hospital room?: None Help needed climbing 3-5 steps with a railing? : None 6 Click Score: 24    End of Session   Activity Tolerance: Patient tolerated treatment well Patient left: in chair Nurse Communication: Mobility status PT Visit Diagnosis: Difficulty in walking, not elsewhere classified (R26.2)    Time: [id number] PT Time Calculation (min) (ACUTE ONLY): 22 min   Charges:   PT Evaluation $PT Eval Low Complexity: 1 Low          Aldred Mase L. Tamala Julian, [place] Pager [phone] 09/02/2019   Galen Manila 09/02/2019, 11:26 AM

## 2019-09-02 NOTE — Progress Notes (Signed)
Pt discharged home with all belongings. Pt discharge education completed. No questions or concerns at time of discharge.

## 2019-09-02 NOTE — Discharge Summary (Signed)
Physician Discharge Summary  Wesley Hancock DOB: 1971/09/04 DOA: 08/30/2019  PCP: Inda Coke, PA  Admit date: 08/30/2019 Discharge date: 09/02/2019  Admitted From: Home Disposition:  Home  Recommendations for Outpatient Follow-up:  1. Follow up with PCP in 1 week 2. Please obtain BMP in one week 3. Continue azithromycin 500 mg p.o. daily for an additional 3 days and cefdinir 300 mg p.o. twice daily for an additional 10 days for pneumonia 4. Please follow up on the following pending results: Sputum culture results, finalization of blood cultures  Home Health: No Equipment/Devices: Was on oxygen at home from previous discharge from underlying PE/DVT, titrated off during this hospitalization and no desaturations were noted on ambulation with physical therapy.  Discharge Condition: Stable CODE STATUS: Full code Diet recommendation: Heart Healthy  History of present illness:  Wesley Hancock is a 48 year old Caucasian gentleman with past medical history remarkable for essential hypertension, GERD, depression, HLD, and recently diagnosed with PE/DVT on Xarelto, who presented to the [facility] long emergency department with progressive shortness of breath.   Patient was hospitalized 9/22--9/27 for acute bilateral pulmonary emboli, lower extremity DVT, bilateral pneumonia, acute hypoxic respiratory failure and acute kidney injury. He was discharged on Xarelto, Levaquin, steroid taper and 2 L nasal cannula. Acute kidney injury thought multifactorial including contrast injury, ARB, diuretic and Toradol.  Initially felt well when he went home but yesterday noted increasing shortness of breath and oxygen saturations in the 80s. Breathing status continued to worsen and so he came to the emergency department today for evaluation. He has had some chest pain on the left side from the PE. No specific aggravating or alleviating factors. He has been compliant with Xarelto and  antibiotic. No fever at home.  CT showed bilateral pneumonia, bilateral peripheral PE. Treated with ceftriaxone and azithromycin and referred for admission.  Hospital course:  Acute hypoxic respiratory failure Multifocal pneumonia Bilateral pulmonary embolism Patient presenting to ED with progressive shortness of breath.  Recently discharged from outside hospital with new diagnosis of PE/DVT and pneumonia.  Patient reports compliance with his home Xarelto, and was discharged on 2 L nasal cannula.  BNP slightly elevated at 530.  WBC count elevated 14.7 on admission.  Troponin flat at 23 followed by 21.  Recent echocardiogram on 08/24/2019 notable for LVEF 55-65%.  CTA PE notable for extensive bilateral airway opacities out of proportion in regards to his pulmonary embolism consistent with multifocal pneumonia.  MRSA PCR negative.  Patient was started on empiric antibiotics with azithromycin and ceftriaxone.  Blood cultures remained negative during the hospitalization.  Sputum culture with rare gram variable rods.  Patient symptoms progressively improved during hospitalization and he was able to be titrated off of supplemental oxygen without desaturation noted on ambulation with physical therapy at time of discharge.  We will continue antibiotics outpatient with 3 additional days of azithromycin 500 mg p.o. daily in addition to cefdinir 300 mg p.o. twice daily for an additional 10 days.  Continue Xarelto as prescribed previously for PE/DVT.  Recommend follow-up with PCP in 7-10 days following hospitalization.  Bilateral pulmonary embolism DVT Continue Xarelto.  Patient with reported family history of blood clotting in the past, may warrant hypercoagulable work-up once this acute event resolves.  Acute renal failure Most recent creatinine 2.47 on outside hospital discharge on 08/27/2019. Acute kidney injury thought multifactorial including contrast injury, ARB, diuretic and Toradol.  Also did have a  contrast enhanced CTA on this hospitalization in the ED. creatinine 1.49 at time  of discharge.  Recommend repeat BMP in 1 week at PCP visit.  GERD: Continue PPI  Hypokalemia Hypomagnesemia Repleted during hospitalization.  Depression: Continue Lexapro  Diarrhea C. difficile negative on admission.  Continue Imodium prn.  If persists, may warrant outpatient gastroenterology evaluation.  HLD: Continue statin    Discharge Diagnoses:  Principal Problem:   Lobar pneumonia ([place]) Active Problems:   Bilateral pneumonia   AKI (acute kidney injury) ([place])   Pulmonary emboli ([name])   DVT (deep venous thrombosis) [facility])    Discharge Instructions  Discharge Instructions    Call MD for:  difficulty breathing, headache or visual disturbances   Complete by: As directed    Call MD for:  extreme fatigue   Complete by: As directed    Call MD for:  persistant dizziness or light-headedness   Complete by: As directed    Call MD for:  persistant nausea and vomiting   Complete by: As directed    Call MD for:  severe uncontrolled pain   Complete by: As directed    Call MD for:  temperature >100.4   Complete by: As directed    Diet - low sodium heart healthy   Complete by: As directed    Increase activity slowly   Complete by: As directed      Allergies as of 09/02/2019      Reactions   Diltiazem Hcl Diarrhea   Lethargic as well      Medication List    STOP taking these medications   levofloxacin 750 MG tablet Commonly known as: LEVAQUIN   predniSONE 20 MG tablet Commonly known as: DELTASONE     TAKE these medications   acetaminophen 500 MG tablet Commonly known as: TYLENOL Take 1,000 mg by mouth every 6 (six) hours as needed for mild pain.   albuterol 108 (90 Base) MCG/ACT inhaler Commonly known as: VENTOLIN HFA Inhale 1-2 puffs into the lungs every 6 (six) hours as needed for wheezing or shortness of breath.   atorvastatin 10 MG tablet Commonly known as:  LIPITOR Take 1 tablet (10 mg total) by mouth daily.   azithromycin 500 MG tablet Commonly known as: Zithromax Take 1 tablet (500 mg total) by mouth daily for 3 days. Take 1 tablet daily for 3 days. What changed:   medication strength  how much to take  how to take this  when to take this  additional instructions   cefdinir 300 MG capsule Commonly known as: OMNICEF Take 1 capsule (300 mg total) by mouth 2 (two) times daily for 10 days.   escitalopram 10 MG tablet Commonly known as: Lexapro Take 1 tablet (10 mg total) by mouth daily.   fluticasone 50 MCG/ACT nasal spray Commonly known as: FLONASE Place 1 spray into both nostrils daily.   nitroGLYCERIN 0.4 MG SL tablet Commonly known as: NITROSTAT Place 1 tablet (0.4 mg total) under the tongue every 5 (five) minutes as needed for chest pain.   omeprazole 20 MG capsule Commonly known as: PRILOSEC Take 20 mg by mouth 2 (two) times daily before a meal.   Rivaroxaban 15 MG Tabs tablet Commonly known as: XARELTO Take 15 mg by mouth 2 (two) times daily with a meal.   telmisartan-hydrochlorothiazide 80-25 MG tablet Commonly known as: Micardis HCT Take 1 tablet by mouth daily.      Follow-up Information    Inda Coke, [place]. Schedule an appointment as soon as possible for a visit in 1 week(s).   Specialty: Physician Assistant Contact  information: [place] [address] [phone]          Allergies  Allergen Reactions  . Diltiazem Hcl Diarrhea    Lethargic as well    Consultations:  none   Procedures/Studies: Dg Chest 2 View  Result Date: 08/30/2019 CLINICAL DATA:  Chest tightness and shortness of breath EXAM: CHEST - 2 VIEW COMPARISON:  June 20, 2019 FINDINGS: There is extensive airspace disease throughout much of the right lung and in a more patchy distribution in the left lung. Heart is borderline enlarged with pulmonary vascularity normal. No adenopathy. No bone lesions. No  pneumothorax. IMPRESSION: Multifocal pneumonia bilaterally, more severe on the right than on the left. Borderline cardiomegaly. No evident adenopathy. Electronically Signed   By: Lowella Grip III M.D.   On: 08/30/2019 11:18   Ct Angio Chest Pe W And/or Wo Contrast  Result Date: 08/30/2019 CLINICAL DATA:  Shortness of breath, chest pain EXAM: CT ANGIOGRAPHY CHEST WITH CONTRAST TECHNIQUE: Multidetector CT imaging of the chest was performed using the standard protocol during bolus administration of intravenous contrast. Multiplanar CT image reconstructions and MIPs were obtained to evaluate the vascular anatomy. CONTRAST:  16mL OMNIPAQUE IOHEXOL 350 MG/ML SOLN COMPARISON:  Plain film earlier today FINDINGS: Cardiovascular: Filling defects are seen within lower lobe pulmonary arteries bilaterally, left greater than right compatible with pulmonary emboli. No evidence of right heart strain. Heart is normal size. Aorta is normal caliber. Mediastinum/Nodes: Scattered mildly prominent mediastinal lymph nodes, likely reactive. Mildly prominent bilateral hilar lymph nodes. No axillary adenopathy. Lungs/Pleura: Moderate left pleural effusion. Extensive bilateral airspace opacities at out of proportion to that expected with the lower lobe pulmonary emboli. This likely reflects multifocal pneumonia. Upper Abdomen: Fatty infiltration of the liver. Prior cholecystectomy. Musculoskeletal: Chest wall soft tissues are unremarkable. No acute bony abnormality. Review of the MIP images confirms the above findings. IMPRESSION: Small bilateral lower lobe pulmonary emboli, left greater than right. No evidence of right heart strain. Moderate left pleural effusion. Extensive bilateral airspace opacities out of proportion to the pulmonary emboli in the lower lobes most compatible with multifocal pneumonia. Fatty infiltration of the liver. Electronically Signed   By: Rolm Baptise M.D.   On: 08/30/2019 15:27       Subjective: Patient seen and examined at bedside, resting comfortably.  Shortness of breath has completely resolved.  Has been titrated off of supplemental oxygen overnight.  Work with physical therapy this morning with no desaturation noted on ambulation.  Ready for discharge home.  No other complaints or concerns at this time.  Denies headache, no fever/chills/night sweats, no nausea cefonicid diarrhea, no chest pain, no palpitations, no abdominal pain, no weakness, no paresthesias.  No acute events overnight per nurse staff.  Discharge Exam: Vitals:   09/01/19 2033 09/02/19 0513  BP: 138/85 131/89  Pulse: 90 70  Resp: 18 20  Temp: 99.3 F (37.4 C) 98.1 F (36.7 C)  SpO2: 93% 96%   Vitals:   09/01/19 0609 09/01/19 1255 09/01/19 2033 09/02/19 0513  BP: (!) 142/88 (!) 155/97 138/85 131/89  Pulse: 70 91 90 70  Resp: (!) 24 18 18 20   Temp: 98.9 F (37.2 C) 99 F (37.2 C) 99.3 F (37.4 C) 98.1 F (36.7 C)  TempSrc: Oral Oral Oral Oral  SpO2: 95% 95% 93% 96%  Weight:      Height:        General: Pt is alert, awake, not in acute distress Cardiovascular: RRR, S1/S2 +, no rubs,  no gallops Respiratory: CTA bilaterally, no wheezing, no rhonchi, oxygenating well on room air Abdominal: Soft, NT, ND, bowel sounds + Extremities: no edema, no cyanosis    The results of significant diagnostics from this hospitalization (including imaging, microbiology, ancillary and laboratory) are listed below for reference.     Microbiology: Recent Results (from the past 240 hour(s))  SARS Coronavirus 2 [facility] order, Performed in [facility] hospital lab) Nasopharyngeal Nasopharyngeal Swab     Status: None   Collection Time: 08/30/19  3:48 PM   Specimen: Nasopharyngeal Swab  Result Value Ref Range Status   SARS Coronavirus 2 NEGATIVE NEGATIVE Final    Comment: (NOTE) If result is NEGATIVE SARS-CoV-2 target nucleic acids are NOT DETECTED. The SARS-CoV-2 RNA is generally detectable in  upper and lower  respiratory specimens during the acute phase of infection. The lowest  concentration of SARS-CoV-2 viral copies this assay can detect is 250  copies / mL. A negative result does not preclude SARS-CoV-2 infection  and should not be used as the sole basis for treatment or other  patient management decisions.  A negative result may occur with  improper specimen collection / handling, submission of specimen other  than nasopharyngeal swab, presence of viral mutation(s) within the  areas targeted by this assay, and inadequate number of viral copies  (<250 copies / mL). A negative result must be combined with clinical  observations, patient history, and epidemiological information. If result is POSITIVE SARS-CoV-2 target nucleic acids are DETECTED. The SARS-CoV-2 RNA is generally detectable in upper and lower  respiratory specimens dur ing the acute phase of infection.  Positive  results are indicative of active infection with SARS-CoV-2.  Clinical  correlation with patient history and other diagnostic information is  necessary to determine patient infection status.  Positive results do  not rule out bacterial infection or co-infection with other viruses. If result is PRESUMPTIVE POSTIVE SARS-CoV-2 nucleic acids MAY BE PRESENT.   A presumptive positive result was obtained on the submitted specimen  and confirmed on repeat testing.  While 2019 novel coronavirus  (SARS-CoV-2) nucleic acids may be present in the submitted sample  additional confirmatory testing may be necessary for epidemiological  and / or clinical management purposes  to differentiate between  SARS-CoV-2 and other Sarbecovirus currently known to infect humans.  If clinically indicated additional testing with an alternate test  methodology [phone]) is advised. The SARS-CoV-2 RNA is generally  detectable in upper and lower respiratory sp ecimens during the acute  phase of infection. The expected result is  Negative. Fact Sheet for Patients:  StrictlyIdeas.no Fact Sheet for Healthcare Providers: BankingDealers.co.za This test is not yet approved or cleared by the [place] FDA and has been authorized for detection and/or diagnosis of SARS-CoV-2 by FDA under an Emergency Use Authorization (EUA).  This EUA will remain in effect (meaning this test can be used) for the duration of the COVID-19 declaration under Section 564(b)(1) of the Act, 21 U.S.C. section 360bbb-3(b)(1), unless the authorization is terminated or revoked sooner. Performed at [facility], [place] [address], [place], [place] [address]   Culture, blood (Routine X 2) w Reflex to ID Panel     Status: None (Preliminary result)   Collection Time: 08/30/19  3:50 PM   Specimen: BLOOD  Result Value Ref Range Status   Specimen Description   Final    BLOOD RIGHT ANTECUBITAL Performed at [place] [address] [place],  [address]  Special Requests   Final    BOTTLES DRAWN AEROBIC AND ANAEROBIC Blood Culture adequate volume Performed at [place] [address], [place], [place] [address]    Culture   Final    NO GROWTH 3 DAYS Performed at [facility], [place] [address], [place], [place] [address]    Report Status PENDING  Incomplete  Culture, blood (Routine X 2) w Reflex to ID Panel     Status: None (Preliminary result)   Collection Time: 08/30/19  4:07 PM   Specimen: BLOOD  Result Value Ref Range Status   Specimen Description   Final    BLOOD LEFT ANTECUBITAL Performed at [place] [address], [place], [place] [address]    Special Requests   Final    BOTTLES DRAWN AEROBIC AND ANAEROBIC Blood Culture adequate volume Performed at [place] [address] [place], [place] [address]    Culture   Final    NO GROWTH 3 DAYS Performed at [facility], [place] [address], [place], [place] [address]    Report Status PENDING  Incomplete  MRSA PCR Screening     Status: None   Collection Time: 08/30/19  8:48 PM   Specimen: Nasal Mucosa; Nasopharyngeal  Result Value Ref Range Status   MRSA by PCR NEGATIVE NEGATIVE Final    Comment:        The GeneXpert MRSA Assay (FDA approved for NASAL specimens only), is one component of a comprehensive MRSA colonization surveillance program. It is not intended to diagnose MRSA infection nor to guide or monitor treatment for MRSA infections. Performed at [facility], [place] [address] [place], [place] [address]   Sputum Culture     Status: None   Collection Time: 08/30/19  8:48 PM   Specimen: Sputum  Result Value Ref Range Status   Specimen Description SPU  Final   Special Requests NONE  Final   Sputum evaluation   Final    THIS SPECIMEN IS ACCEPTABLE FOR SPUTUM CULTURE Performed at [facility], [place] [address] [place], [place] [address]    Report Status 08/30/2019 FINAL  Final  Culture, respiratory     Status: None   Collection Time: 08/30/19  8:48 PM   Specimen: Sputum  Result Value Ref Range Status   Specimen Description   Final    SPU Performed at [place] [address], [place], [place] [address]    Special Requests   Final    NONE Reflexed from [id number] Performed at [facility], [place] [address], [place], [place] [address]    Gram Stain   Final    RARE WBC PRESENT, PREDOMINANTLY PMN RARE GRAM VARIABLE ROD RARE YEAST    Culture   Final    FEW Consistent with normal respiratory flora. Performed at [facility], [place] [address] [place], [place] [address]    Report Status 09/02/2019 FINAL  Final  C difficile quick scan w PCR reflex     Status: None   Collection Time: 08/31/19  7:50 AM   Specimen: STOOL  Result Value Ref Range Status   C Diff antigen NEGATIVE NEGATIVE Final   C Diff  toxin NEGATIVE NEGATIVE Final   C Diff interpretation No C. difficile detected.  Final    Comment: Performed at [facility], [place] [address] [place],  [address]     Labs: BNP (last  3 results) Recent Labs    08/30/19 1245  BNP [id number]*   Basic Metabolic Panel: Recent Labs  Lab 08/30/19 1244 08/30/19 1329 08/31/19 0524 09/01/19 0509 09/02/19 0558  NA 140 142 139 144 143  K 3.6 3.7 3.1* 3.4* 3.3*  CL 105 103 106 106 109  CO2 25  --  25 28 28   GLUCOSE 104* 97 91 111* 104*  BUN 28* 25* 28* 24* 21*  CREATININE 1.42* 1.30* 1.52* 1.54* 1.49*  CALCIUM 8.5*  --  8.2* 8.4* 8.2*  MG  --   --   --  1.7 2.1   Liver Function Tests: No results for input(s): AST, ALT, ALKPHOS, BILITOT, PROT, ALBUMIN in the last 168 hours. No results for input(s): LIPASE, AMYLASE in the last 168 hours. No results for input(s): AMMONIA in the last 168 hours. CBC: Recent Labs  Lab 08/30/19 1244 08/30/19 1329 08/31/19 0524 09/01/19 0509  WBC >14.7*  --  9.1 8.8  HGB 12.4* 12.9* 10.8* 11.9*  HCT 37.2* 38.0* 33.6* 36.2*  MCV 92.3  --  94.1 94.0  PLT 203  --  199 199   Cardiac Enzymes: No results for input(s): CKTOTAL, CKMB, CKMBINDEX, TROPONINI in the last 168 hours. BNP: Invalid input(s): POCBNP CBG: No results for input(s): GLUCAP in the last 168 hours. D-Dimer No results for input(s): DDIMER in the last 72 hours. Hgb A1c No results for input(s): HGBA1C in the last 72 hours. Lipid Profile No results for input(s): CHOL, HDL, LDLCALC, TRIG, CHOLHDL, LDLDIRECT in the last 72 hours. Thyroid function studies No results for input(s): TSH, T4TOTAL, T3FREE, THYROIDAB in the last 72 hours.  Invalid input(s): FREET3 Anemia work up No results for input(s): VITAMINB12, FOLATE, FERRITIN, TIBC, IRON, RETICCTPCT in the last 72 hours. Urinalysis    Component Value Date/Time   COLORURINE YELLOW 06/21/2017 0620   APPEARANCEUR CLEAR 06/21/2017 0620   LABSPEC 1.023 06/21/2017  0620   PHURINE 5.0 06/21/2017 0620   GLUCOSEU NEGATIVE 06/21/2017 0620   HGBUR NEGATIVE 06/21/2017 0620   BILIRUBINUR NEGATIVE 06/21/2017 0620   KETONESUR NEGATIVE 06/21/2017 0620   PROTEINUR NEGATIVE 06/21/2017 0620   NITRITE NEGATIVE 06/21/2017 0620   LEUKOCYTESUR NEGATIVE 06/21/2017 0620   Sepsis Labs Invalid input(s): PROCALCITONIN,  WBC,  LACTICIDVEN Microbiology Recent Results (from the past 240 hour(s))  SARS Coronavirus 2 [facility] order, Performed in [facility] hospital lab) Nasopharyngeal Nasopharyngeal Swab     Status: None   Collection Time: 08/30/19  3:48 PM   Specimen: Nasopharyngeal Swab  Result Value Ref Range Status   SARS Coronavirus 2 NEGATIVE NEGATIVE Final    Comment: (NOTE) If result is NEGATIVE SARS-CoV-2 target nucleic acids are NOT DETECTED. The SARS-CoV-2 RNA is generally detectable in upper and lower  respiratory specimens during the acute phase of infection. The lowest  concentration of SARS-CoV-2 viral copies this assay can detect is 250  copies / mL. A negative result does not preclude SARS-CoV-2 infection  and should not be used as the sole basis for treatment or other  patient management decisions.  A negative result may occur with  improper specimen collection / handling, submission of specimen other  than nasopharyngeal swab, presence of viral mutation(s) within the  areas targeted by this assay, and inadequate number of viral copies  (<250 copies / mL). A negative result must be combined with clinical  observations, patient history, and epidemiological information. If result is POSITIVE SARS-CoV-2 target nucleic acids are DETECTED. The SARS-CoV-2 RNA is generally detectable in  upper and lower  respiratory specimens dur ing the acute phase of infection.  Positive  results are indicative of active infection with SARS-CoV-2.  Clinical  correlation with patient history and other diagnostic information is  necessary to determine patient infection  status.  Positive results do  not rule out bacterial infection or co-infection with other viruses. If result is PRESUMPTIVE POSTIVE SARS-CoV-2 nucleic acids MAY BE PRESENT.   A presumptive positive result was obtained on the submitted specimen  and confirmed on repeat testing.  While 2019 novel coronavirus  (SARS-CoV-2) nucleic acids may be present in the submitted sample  additional confirmatory testing may be necessary for epidemiological  and / or clinical management purposes  to differentiate between  SARS-CoV-2 and other Sarbecovirus currently known to infect humans.  If clinically indicated additional testing with an alternate test  methodology [phone]) is advised. The SARS-CoV-2 RNA is generally  detectable in upper and lower respiratory sp ecimens during the acute  phase of infection. The expected result is Negative. Fact Sheet for Patients:  StrictlyIdeas.no Fact Sheet for Healthcare Providers: BankingDealers.co.za This test is not yet approved or cleared by the [place] FDA and has been authorized for detection and/or diagnosis of SARS-CoV-2 by FDA under an Emergency Use Authorization (EUA).  This EUA will remain in effect (meaning this test can be used) for the duration of the COVID-19 declaration under Section 564(b)(1) of the Act, 21 U.S.C. section 360bbb-3(b)(1), unless the authorization is terminated or revoked sooner. Performed at [facility], [place] [address], [place], [place] [address]   Culture, blood (Routine X 2) w Reflex to ID Panel     Status: None (Preliminary result)   Collection Time: 08/30/19  3:50 PM   Specimen: BLOOD  Result Value Ref Range Status   Specimen Description   Final    BLOOD RIGHT ANTECUBITAL Performed at [place] [address], [place], [place] [address]    Special Requests   Final    BOTTLES DRAWN AEROBIC AND ANAEROBIC Blood Culture adequate  volume Performed at [place] [address], [place], [place] [address]    Culture   Final    NO GROWTH 3 DAYS Performed at [facility], [place] [address] [place], [place] [address]    Report Status PENDING  Incomplete  Culture, blood (Routine X 2) w Reflex to ID Panel     Status: None (Preliminary result)   Collection Time: 08/30/19  4:07 PM   Specimen: BLOOD  Result Value Ref Range Status   Specimen Description   Final    BLOOD LEFT ANTECUBITAL Performed at [place] [address], [place], [place] [address]    Special Requests   Final    BOTTLES DRAWN AEROBIC AND ANAEROBIC Blood Culture adequate volume Performed at [place] [address], [place], [place] [address]    Culture   Final    NO GROWTH 3 DAYS Performed at [facility], [place] [address], [place], [place] [address]    Report Status PENDING  Incomplete  MRSA PCR Screening     Status: None   Collection Time: 08/30/19  8:48 PM   Specimen: Nasal Mucosa; Nasopharyngeal  Result Value Ref Range Status   MRSA by PCR NEGATIVE NEGATIVE Final    Comment:        The GeneXpert MRSA Assay (FDA approved for NASAL specimens only), is one component of a comprehensive MRSA colonization  surveillance program. It is not intended to diagnose MRSA infection nor to guide or monitor treatment for MRSA infections. Performed at [facility], [place] [address], [place], [place] [address]   Sputum Culture     Status: None   Collection Time: 08/30/19  8:48 PM   Specimen: Sputum  Result Value Ref Range Status   Specimen Description SPU  Final   Special Requests NONE  Final   Sputum evaluation   Final    THIS SPECIMEN IS ACCEPTABLE FOR SPUTUM CULTURE Performed at [facility], [place] [address], [place], [place] [address]    Report Status 08/30/2019 FINAL  Final  Culture, respiratory     Status: None   Collection Time:  08/30/19  8:48 PM   Specimen: Sputum  Result Value Ref Range Status   Specimen Description   Final    SPU Performed at [place] [address], [place], [place] [address]    Special Requests   Final    NONE Reflexed from [id number] Performed at [facility], [place] [address], [place], [place] [address]    Gram Stain   Final    RARE WBC PRESENT, PREDOMINANTLY PMN RARE GRAM VARIABLE ROD RARE YEAST    Culture   Final    FEW Consistent with normal respiratory flora. Performed at [facility], [place] [address], [place], [place] [address]    Report Status 09/02/2019 FINAL  Final  C difficile quick scan w PCR reflex     Status: None   Collection Time: 08/31/19  7:50 AM   Specimen: STOOL  Result Value Ref Range Status   C Diff antigen NEGATIVE NEGATIVE Final   C Diff toxin NEGATIVE NEGATIVE Final   C Diff interpretation No C. difficile detected.  Final    Comment: Performed at [facility], [place] [address] [place], [place] [address]     Time coordinating discharge: Over 30 minutes  SIGNED:    J [place] DO [facility] 09/02/2019, 12:11 PM

## 2019-09-02 NOTE — Progress Notes (Signed)
SATURATION QUALIFICATIONS: (This note is used to comply with regulatory documentation for home oxygen)  Patient Saturations on Room Air at Rest = 96%  Patient Saturations on Room Air while Ambulating = 88-96%    Please briefly explain why patient needs home oxygen: Pt dropped briefly to 88% on room air when ambulating while speaking after 150'. When he stopped and did deep breathing it quickly rebounded into the 90s again.  Wesley Hancock, [place] Pager [phone] 09/02/2019

## 2019-09-04 LAB — CULTURE, BLOOD (ROUTINE X 2)
Culture: NO GROWTH
Culture: NO GROWTH
Special Requests: ADEQUATE
Special Requests: ADEQUATE

## 2019-09-05 ENCOUNTER — Telehealth: Payer: Self-pay

## 2019-09-05 NOTE — Telephone Encounter (Signed)
Transition Care Management Follow-up Telephone Call  Date of discharge and from where: [facility] 09/02/19  How have you been since you were released from the hospital? Slowly improving   Any questions or concerns? No   Items Reviewed:  Did the pt receive and understand the discharge instructions provided? Yes   Medications obtained and verified? Yes   Any new allergies since your discharge? No   Dietary orders reviewed? Yes  Do you have support at home? Yes   Other (ie: DME, Home Health, etc) Oxygen use at home   Functional Questionnaire: (I = Independent and D = Dependent) ADL's: I  Bathing/Dressing- I   Meal Prep- I  Eating- I  Maintaining continence- I  Transferring/Ambulation- I  Managing Meds- I   Follow up appointments reviewed:    PCP Hospital f/u appt confirmed? Yes  Scheduled to see Inda Coke PA on 09/06/19 @ 0820.  [facility] f/u appt confirmed? n/a   Are transportation arrangements needed? No   If their condition worsens, is the pt aware to call  their PCP or go to the ED? Yes  Was the patient provided with contact information for the PCP's office or ED? Yes  Was the pt encouraged to call back with questions or concerns? Yes

## 2019-09-06 ENCOUNTER — Telehealth: Payer: Self-pay | Admitting: Family

## 2019-09-06 ENCOUNTER — Other Ambulatory Visit: Payer: Self-pay

## 2019-09-06 ENCOUNTER — Ambulatory Visit (HOSPITAL_COMMUNITY)
Admission: RE | Admit: 2019-09-06 | Discharge: 2019-09-06 | Disposition: A | Discharge status: Home / Self Care | Payer: BC Managed Care – PPO | Source: Ambulatory Visit | Attending: Physician Assistant | Admitting: Physician Assistant

## 2019-09-06 ENCOUNTER — Ambulatory Visit (INDEPENDENT_AMBULATORY_CARE_PROVIDER_SITE_OTHER): Payer: BC Managed Care – PPO | Admitting: Physician Assistant

## 2019-09-06 ENCOUNTER — Encounter: Payer: Self-pay | Admitting: Physician Assistant

## 2019-09-06 VITALS — BP 130/80 | HR 75 | Temp 97.6°F | Ht 73.0 in | Wt 277.0 lb

## 2019-09-06 DIAGNOSIS — J181 Lobar pneumonia, unspecified organism: Secondary | ICD-10-CM

## 2019-09-06 DIAGNOSIS — R519 Headache, unspecified: Secondary | ICD-10-CM | POA: Diagnosis not present

## 2019-09-06 DIAGNOSIS — F411 Generalized anxiety disorder: Secondary | ICD-10-CM

## 2019-09-06 DIAGNOSIS — I2694 Multiple subsegmental pulmonary emboli without acute cor pulmonale: Secondary | ICD-10-CM | POA: Diagnosis not present

## 2019-09-06 DIAGNOSIS — Z23 Encounter for immunization: Secondary | ICD-10-CM | POA: Diagnosis not present

## 2019-09-06 DIAGNOSIS — I82409 Acute embolism and thrombosis of unspecified deep veins of unspecified lower extremity: Secondary | ICD-10-CM

## 2019-09-06 DIAGNOSIS — I10 Essential (primary) hypertension: Secondary | ICD-10-CM

## 2019-09-06 DIAGNOSIS — N179 Acute kidney failure, unspecified: Secondary | ICD-10-CM

## 2019-09-06 HISTORY — DX: Headache, unspecified: R51.9

## 2019-09-06 LAB — BASIC METABOLIC PANEL
BUN: 16 mg/dL (ref 6–23)
CO2: 30 mEq/L (ref 19–32)
Calcium: 9.6 mg/dL (ref 8.4–10.5)
Chloride: 99 mEq/L (ref 96–112)
Creatinine, Ser: 1.36 mg/dL (ref 0.40–1.50)
GFR: 56.07 mL/min — ABNORMAL LOW (ref >60.00)
Glucose, Bld: 94 mg/dL (ref 70–99)
Potassium: 3.9 mEq/L (ref 3.5–5.1)
Sodium: 141 mEq/L (ref 135–145)

## 2019-09-06 MED ORDER — ALBUTEROL SULFATE HFA 108 (90 BASE) MCG/ACT IN AERS: 1.0000 | INHALATION_SPRAY | Freq: Four times a day (QID) | RESPIRATORY_TRACT | 0 refills | Status: DC | PRN

## 2019-09-06 MED ORDER — TELMISARTAN-HCTZ 40-12.5 MG PO TABS: 1.0000 | ORAL_TABLET | Freq: Every day | ORAL | 0 refills | Status: DC

## 2019-09-06 NOTE — Assessment & Plan Note (Signed)
I am going to reduce his medication to telmisartan-hydrochlorothiazide 40-12.5 milligrams daily.  Follow-up in 1 month.

## 2019-09-06 NOTE — Patient Instructions (Signed)
It was great to see you!  1. I have sent in the refill of the albuterol. 2. I have sent in half dose of the BP medication. Let's follow-up in 1 month for this. 3. Increase Lexapro to 20 mg. We can also follow-up in 1 month for this. 4. You will be contacted by pulmonary to schedule an appointment. You can also call them if you'd like [phone] 5. You will be contacted about scheduling your MRI -- if any severe changes to headache in the meantime, please go to the ER. 6. You will be contacted about your referral to the hematologist. 7. Let me know if any paperwork needs to be completed to return to work.  Take care,  Inda Coke PA-C

## 2019-09-06 NOTE — Progress Notes (Deleted)
Wesley Hancock is a 48 y.o. male is here for Hospital follow up.  I acted as a Education administrator for [employer], PA-C Anselmo Pickler, LPN  History of Present Illness:   Chief Complaint  Patient presents with  . Hospitalization Follow-up    HPI  Hospital follow up Pt here for follow up today. Admitted to hospital on 9/30 for Bilateral PE's, discharged on 10/3.  He is still having a cough and some SOB. Pt said he had to use his oxygen yesterday due to pulse ox went down to 82 with activity/  There are no preventive care reminders to display for this patient.  Past Medical History:  Diagnosis Date  . Anxiety   . Depression   . DVT (deep venous thrombosis) ([place])   . High cholesterol   . Hyperlipidemia   . Hypertension   . Nutcracker esophagus   . Pulmonary embolus [facility])      Social History   Socioeconomic History  . Marital status: Married    Spouse name: Not on file  . Number of children: Not on file  . Years of education: Not on file  . Highest education level: Not on file  Occupational History  . Not on file  Social Needs  . Financial resource strain: Not on file  . Food insecurity    Worry: Not on file    Inability: Not on file  . Transportation needs    Medical: Not on file    Non-medical: Not on file  Tobacco Use  . Smoking status: Former Research scientist (life sciences)  . Smokeless tobacco: Never Used  Substance and Sexual Activity  . Alcohol use: Yes    Alcohol/week: 5.0 standard drinks    Types: 5 Cans of beer per week  . Drug use: No  . Sexual activity: Yes  Lifestyle  . Physical activity    Days per week: Not on file    Minutes per session: Not on file  . Stress: Not on file  Relationships  . Social Herbalist on phone: Not on file    Gets together: Not on file    Attends religious service: Not on file    Active member of club or organization: Not on file    Attends meetings of clubs or organizations: Not on file    Relationship status: Not on file  .  Intimate partner violence    Fear of current or ex partner: Not on file    Emotionally abused: Not on file    Physically abused: Not on file    Forced sexual activity: Not on file  Other Topics Concern  . Not on file  Social History Narrative   Married (03/2016)   Lives in [place], [place] (Originally from [place] )   Some family in [place] live in [place].   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   Did play minor league baseball. ([place])   Likes to hunt, fish and plays in competitive soft ball league (slow pitch)   Interest includes trips to [employer] and collecting colorful socks.    Past Surgical History:  Procedure Laterality Date  . CHOLECYSTECTOMY      Family History  Problem Relation Age of Onset  . Diabetes Mother   . Hyperlipidemia Mother   . Hypertension Mother   . Heart disease Father 40  . Hyperlipidemia Father   . Hypertension Father   . Prostate cancer  Father 2  . Lung cancer Father 77       Dx 06/18/2017  . Diabetes Sister   . Hyperlipidemia Sister   . Hypertension Sister   . Diabetes Maternal Grandmother   . Heart disease Maternal Grandmother   . Hyperlipidemia Maternal Grandmother   . Hypertension Maternal Grandmother   . Colon cancer Maternal Grandmother   . Heart disease Maternal Grandfather   . Hyperlipidemia Maternal Grandfather   . Hypertension Maternal Grandfather   . Stroke Maternal Grandfather   . Liver cancer Maternal Grandfather   . Heart disease Paternal Grandmother     PMHx, SurgHx, SocialHx, FamHx, Medications, and Allergies were reviewed in the Visit Navigator and updated as appropriate.   Patient Active Problem List   Diagnosis Date Noted  . Bilateral pneumonia 08/30/2019  . Lobar pneumonia ([place]) 08/30/2019  . AKI (acute kidney injury) ([place]) 08/30/2019  . Pulmonary emboli ([place]) 08/30/2019  . DVT (deep venous thrombosis) ([place]) 08/30/2019  . Dyspnea on exertion 09/08/2017  . High  cholesterol 06/24/2017  . Depression 06/24/2017  . GAD (generalized anxiety disorder) 08/06/2016  . Essential hypertension 09/23/2014    Social History   Tobacco Use  . Smoking status: Former Research scientist (life sciences)  . Smokeless tobacco: Never Used  Substance Use Topics  . Alcohol use: Yes    Alcohol/week: 5.0 standard drinks    Types: 5 Cans of beer per week  . Drug use: No    Current Medications and Allergies:    Current Outpatient Medications:  .  acetaminophen (TYLENOL) 500 MG tablet, Take 1,000 mg by mouth every 6 (six) hours as needed for mild pain., Disp: , Rfl:  .  albuterol (VENTOLIN HFA) 108 (90 Base) MCG/ACT inhaler, Inhale 1-2 puffs into the lungs every 6 (six) hours as needed for wheezing or shortness of breath., Disp: 8 g, Rfl: 0 .  atorvastatin (LIPITOR) 10 MG tablet, Take 1 tablet (10 mg total) by mouth daily., Disp: 30 tablet, Rfl: 5 .  cefdinir (OMNICEF) 300 MG capsule, Take 1 capsule (300 mg total) by mouth 2 (two) times daily for 10 days., Disp: 20 capsule, Rfl: 0 .  escitalopram (LEXAPRO) 10 MG tablet, Take 1 tablet (10 mg total) by mouth daily., Disp: 30 tablet, Rfl: 5 .  fluticasone (FLONASE) 50 MCG/ACT nasal spray, Place 1 spray into both nostrils daily. , Disp: , Rfl:  .  nitroGLYCERIN (NITROSTAT) 0.4 MG SL tablet, Place 1 tablet (0.4 mg total) under the tongue every 5 (five) minutes as needed for chest pain., Disp: 15 tablet, Rfl: 2 .  omeprazole (PRILOSEC) 20 MG capsule, Take 20 mg by mouth 2 (two) times daily before a meal., Disp: , Rfl:  .  Rivaroxaban (XARELTO) 15 MG TABS tablet, Take 15 mg by mouth 2 (two) times daily with a meal., Disp: , Rfl:  .  telmisartan-hydrochlorothiazide (MICARDIS HCT) 80-25 MG tablet, Take 1 tablet by mouth daily., Disp: 30 tablet, Rfl: 5   Allergies  Allergen Reactions  . Diltiazem Hcl Diarrhea    Lethargic as well    Review of Systems   ROS  Vitals:   Vitals:   09/06/19 0822  BP: 130/80  Pulse: 75  Temp: 97.6 F (36.4 C)   TempSrc: Temporal  SpO2: 96%  Weight: 277 lb (125.6 kg)  Height: 6\' 1"  (1.854 m)     Body mass index is 36.55 kg/m.   Physical Exam:    Physical Exam   Assessment and Plan:    There are no  diagnoses linked to this encounter.  . Reviewed expectations re: course of current medical issues. . Discussed self-management of symptoms. . Outlined signs and symptoms indicating need for more acute intervention. . Patient verbalized understanding and all questions were answered. . See orders for this visit as documented in the electronic medical record. . Patient received an After Visit Summary.  ***  Inda Coke, PA-C [place], [place] 09/06/2019  Follow-up: No follow-ups on file.

## 2019-09-06 NOTE — Assessment & Plan Note (Addendum)
Due to new neurological change with his headaches, will obtain MRI for further work-up, to rule out mass or other suspicious etiology.  Worsening precautions advised in the interim.

## 2019-09-06 NOTE — Assessment & Plan Note (Signed)
Currently on Xarelto 15 mg twice daily.  He would like a referral to hematology for further work-up, and guidance of this condition.  I have put this referral in for him.

## 2019-09-06 NOTE — Progress Notes (Signed)
[facility] Discharge Acute Issues Care Follow Up                                                                        Patient Demographics  Wesley Hancock, is a 48 y.o. male  DOB 08-Nov-1971  MRN [id number].  Primary MD  Inda Coke, PA  Reason for TCC follow Up - s/p hospitalization for bilateral PE and multifocal PNA   Past Medical History:  Diagnosis Date  . Depression   . DVT (deep venous thrombosis) ([place])   . High cholesterol   . Hyperlipidemia   . Nutcracker esophagus     Past Surgical History:  Procedure Laterality Date  . CHOLECYSTECTOMY      Recent HPI and Hospital Course Patient was hospitalized 9 22-9 27 for acute bilateral pulmonary emboli, lower extremity DVT, bilateral pneumonia and acute hypoxic respiratory failure at [facility].  He was scheduled to follow-up with me, however he was developing worsening shortness of breath, so he instead had to return to the ER before his hospital follow-up.  He presents today for follow-up of his second admission to [facility] and was admitted 08/30/2019 to 09/29/2019.  He was continued on Xarelto, and also was continued on antibiotics for his pneumonia.  He did have acute kidney injury as well likely due to contrast, medications and possible dehydration.  CT scan on 08/30/2019 showed bilateral lower lobe pulmonary emboli, left pleural effusion, and extensive bilateral opacities out of proportion to the pulmonary emboli likely multifocal pneumonia.  Venous ultrasound done on 08/23/2019 showed partially occlusive thrombus in the right femoral vein.  [place] Issue to be followed in the Clinic Patient is here to discuss multiple issues including: 1.  Shortness of breath/PNA 2.  Worsening anxiety and depression 3.  Change in headaches 4.  Concern for blood clots and ongoing need for  blood thinners 5.  Hypertension 6.  Acute kidney injury  Subjective:   Wesley Hancock today has no chest pain, No abdominal pain - No Nausea, No new weakness tingling or numbness, No Cough.   He is having headaches.  He states that these headaches are not typical for him, and that over the past month, they have all had some sort of ocular involvement, typically involving the peripheral vision having jagged edges and loss of vision, while central vision remains intact.  He denies blurred vision or double vision.  Sometimes he will have these visual changes without any headache.  He does have a history of migraines, but typically does not have visual auras.  He denies: Dizziness, lightheadedness, weakness on one side of the body, slurred speech, confusion.  He is having worsening depression and anxiety since leaving the hospital.  He continues on Lexapro 10 mg daily.  He is anxious to get back to work, but does not want to go back to soon due to his health issues.  He also has a baby at home.  Denies  any suicidal or homicidal ideation.  He has been requiring intermittent use of 2 L of oxygen.  He has a pulse ox at home that he has been using that has been relatively normal, however yesterday it did dip in high 80s to low 90s, and he did supplement with 2 L of supplemental oxygen.  He has seen Dr. Melvyn Novas in the past for dyspnea on exertion, almost 2 years ago.  He reports that he is still taking his Omnicef medication for his pneumonia.  He has completed his azithromycin.  He reports that he is on Xarelto, and tolerating well.  He is wearing compression hose.  He states that clotting labs were done at [facility]. Protein S was 58, protein C was 108, factor II activity was 126, factor V Leiden was negative, antithromb III function 126.   Additionally he has been without his blood pressure medicine intermittently, and states that he has not taken it in at least 1 to 2 days.  Blood pressure today is 130/80.   He is wondering if he should reduce his dosage of medication.  It was suggested that his worsening renal function was due to combination of this medication in addition to other issues.  Assessment & Plan   DVT (deep venous thrombosis) ([name]) Currently on Xarelto 15 mg twice daily.  He would like a referral to hematology for further work-up, and guidance of this condition.  I have put this referral in for him.  Pulmonary emboli ([name]) Currently on Xarelto 15 mg twice daily.  He would like a referral to hematology for further work-up, and guidance of this condition.  I have put this referral in for him.  Essential hypertension I am going to reduce his medication to telmisartan-hydrochlorothiazide 40-12.5 milligrams daily.  Follow-up in 1 month.  Lobar pneumonia ([place]) Does not appear in any acute distress today.  Discussed need for evaluation from pulmonology, so he can determine oxygen use needs as well as make sure his symptoms are improving, as well as pneumonia resolving.  We were able to secure an appointment for tomorrow.  Worsening precautions advised in the interim.  AKI (acute kidney injury) ([place]) We will recheck BMP today.  I have also reduced his telmisartan/hydrochlorothiazide dosage today.  Further evaluation after BMP results.  GAD (generalized anxiety disorder) Uncontrolled.  He is having worsening anxiety due to his health.  We are going to increase his Lexapro to 20 mg daily.  He is going to follow-up with me in 1 month, sooner if needed.  Acute nonintractable headache Due to new neurological change with his headaches, will obtain MRI for further work-up, to rule out mass or other suspicious etiology.  Worsening precautions advised in the interim.   Objective:   Vitals:   09/06/19 0822  BP: 130/80  Pulse: 75  Temp: 97.6 F (36.4 C)  TempSrc: Temporal  SpO2: 96%  Weight: 277 lb (125.6 kg)  Height: 6\' 1"  (1.854 m)    Wt Readings from Last 3 Encounters:  09/06/19  277 lb (125.6 kg)  08/30/19 280 lb (127 kg)  06/20/19 280 lb (127 kg)    Allergies as of 09/06/2019      Reactions   Diltiazem Hcl Diarrhea   Lethargic as well      Medication List       Accurate as of September 06, 2019 11:53 AM. If you have any questions, ask your nurse or doctor.        STOP taking these medications  telmisartan-hydrochlorothiazide 80-25 MG tablet Commonly known as: Micardis HCT Replaced by: telmisartan-hydrochlorothiazide 40-12.5 MG tablet Stopped by: Inda Coke, PA     TAKE these medications   acetaminophen 500 MG tablet Commonly known as: TYLENOL Take 1,000 mg by mouth every 6 (six) hours as needed for mild pain.   albuterol 108 (90 Base) MCG/ACT inhaler Commonly known as: VENTOLIN HFA Inhale 1-2 puffs into the lungs every 6 (six) hours as needed for wheezing or shortness of breath.   atorvastatin 10 MG tablet Commonly known as: LIPITOR Take 1 tablet (10 mg total) by mouth daily.   cefdinir 300 MG capsule Commonly known as: OMNICEF Take 1 capsule (300 mg total) by mouth 2 (two) times daily for 10 days.   escitalopram 10 MG tablet Commonly known as: Lexapro Take 1 tablet (10 mg total) by mouth daily.   fluticasone 50 MCG/ACT nasal spray Commonly known as: FLONASE Place 1 spray into both nostrils daily.   nitroGLYCERIN 0.4 MG SL tablet Commonly known as: NITROSTAT Place 1 tablet (0.4 mg total) under the tongue every 5 (five) minutes as needed for chest pain.   omeprazole 20 MG capsule Commonly known as: PRILOSEC Take 20 mg by mouth 2 (two) times daily before a meal.   Rivaroxaban 15 MG Tabs tablet Commonly known as: XARELTO Take 15 mg by mouth 2 (two) times daily with a meal.   telmisartan-hydrochlorothiazide 40-12.5 MG tablet Commonly known as: Micardis HCT Take 1 tablet by mouth daily. Replaces: telmisartan-hydrochlorothiazide 80-25 MG tablet Started by: Inda Coke, PA        Physical Exam: Constitutional: Patient  appears well-developed and well-nourished. Not in obvious distress. HENT: Normocephalic, atraumatic, External right and left ear normal. Oropharynx is clear and moist.  Eyes: Conjunctivae and EOM are normal. PERRLA, no scleral icterus. Neck: Normal ROM. Neck supple. No JVD. No tracheal deviation. No thyromegaly. CVS: RRR, S1/S2 +, no murmurs, no gallops, no carotid bruit.  Pulmonary: Effort and breath sounds normal, no stridor, rhonchi, wheezes, rales.  Abdominal: Soft. BS +, no distension, tenderness, rebound or guarding.  Musculoskeletal: Normal range of motion. No edema and no tenderness.  Lymphadenopathy: No lymphadenopathy noted, cervical, inguinal or axillary Neuro: Alert. Normal reflexes, muscle tone coordination. No cranial nerve deficit. Skin: Skin is warm and dry. No rash noted. Not diaphoretic. No erythema. No pallor. Psychiatric: Normal mood and affect. Behavior, judgment, thought content normal.   Data Review   Micro Results Recent Results (from the past 240 hour(s))  SARS Coronavirus 2 [facility] order, Performed in [facility] hospital lab) Nasopharyngeal Nasopharyngeal Swab     Status: None   Collection Time: 08/30/19  3:48 PM   Specimen: Nasopharyngeal Swab  Result Value Ref Range Status   SARS Coronavirus 2 NEGATIVE NEGATIVE Final    Comment: (NOTE) If result is NEGATIVE SARS-CoV-2 target nucleic acids are NOT DETECTED. The SARS-CoV-2 RNA is generally detectable in upper and lower  respiratory specimens during the acute phase of infection. The lowest  concentration of SARS-CoV-2 viral copies this assay can detect is 250  copies / mL. A negative result does not preclude SARS-CoV-2 infection  and should not be used as the sole basis for treatment or other  patient management decisions.  A negative result may occur with  improper specimen collection / handling, submission of specimen other  than nasopharyngeal swab, presence of viral mutation(s) within the  areas  targeted by this assay, and inadequate number of viral copies  (<250 copies / mL). A negative  result must be combined with clinical  observations, patient history, and epidemiological information. If result is POSITIVE SARS-CoV-2 target nucleic acids are DETECTED. The SARS-CoV-2 RNA is generally detectable in upper and lower  respiratory specimens dur ing the acute phase of infection.  Positive  results are indicative of active infection with SARS-CoV-2.  Clinical  correlation with patient history and other diagnostic information is  necessary to determine patient infection status.  Positive results do  not rule out bacterial infection or co-infection with other viruses. If result is PRESUMPTIVE POSTIVE SARS-CoV-2 nucleic acids MAY BE PRESENT.   A presumptive positive result was obtained on the submitted specimen  and confirmed on repeat testing.  While 2019 novel coronavirus  (SARS-CoV-2) nucleic acids may be present in the submitted sample  additional confirmatory testing may be necessary for epidemiological  and / or clinical management purposes  to differentiate between  SARS-CoV-2 and other Sarbecovirus currently known to infect humans.  If clinically indicated additional testing with an alternate test  methodology [phone]) is advised. The SARS-CoV-2 RNA is generally  detectable in upper and lower respiratory sp ecimens during the acute  phase of infection. The expected result is Negative. Fact Sheet for Patients:  StrictlyIdeas.no Fact Sheet for Healthcare Providers: BankingDealers.co.za This test is not yet approved or cleared by the [place] FDA and has been authorized for detection and/or diagnosis of SARS-CoV-2 by FDA under an Emergency Use Authorization (EUA).  This EUA will remain in effect (meaning this test can be used) for the duration of the COVID-19 declaration under Section 564(b)(1) of the Act, 21 U.S.C. section  360bbb-3(b)(1), unless the authorization is terminated or revoked sooner. Performed at [facility], [place] [address], [place], [place] [address]   Culture, blood (Routine X 2) w Reflex to ID Panel     Status: None   Collection Time: 08/30/19  3:50 PM   Specimen: BLOOD  Result Value Ref Range Status   Specimen Description   Final    BLOOD RIGHT ANTECUBITAL Performed at [place] [address] [place], [place] [address]    Special Requests   Final    BOTTLES DRAWN AEROBIC AND ANAEROBIC Blood Culture adequate volume Performed at [place] [address], [place], [place] [address]    Culture   Final    NO GROWTH 5 DAYS Performed at [facility], [place] [address] [place], [place] [address]    Report Status 09/04/2019 FINAL  Final  Culture, blood (Routine X 2) w Reflex to ID Panel     Status: None   Collection Time: 08/30/19  4:07 PM   Specimen: BLOOD  Result Value Ref Range Status   Specimen Description   Final    BLOOD LEFT ANTECUBITAL Performed at [place] [address], [place], [place] [address]    Special Requests   Final    BOTTLES DRAWN AEROBIC AND ANAEROBIC Blood Culture adequate volume Performed at [place] [address] [place], [place] [address]    Culture   Final    NO GROWTH 5 DAYS Performed at [facility], [place] [address] [place],  [address]    Report Status 09/04/2019 FINAL  Final  MRSA PCR Screening     Status: None   Collection Time: 08/30/19  8:48 PM   Specimen: Nasal Mucosa; Nasopharyngeal  Result Value Ref Range Status   MRSA by PCR NEGATIVE NEGATIVE Final  Comment:        The GeneXpert MRSA Assay (FDA approved for NASAL specimens only), is one component of a comprehensive MRSA colonization surveillance program. It is not intended to diagnose MRSA infection nor to guide or monitor treatment for MRSA infections.  Performed at [facility], [place] [address] [place], [place] [address]   Sputum Culture     Status: None   Collection Time: 08/30/19  8:48 PM   Specimen: Sputum  Result Value Ref Range Status   Specimen Description SPU  Final   Special Requests NONE  Final   Sputum evaluation   Final    THIS SPECIMEN IS ACCEPTABLE FOR SPUTUM CULTURE Performed at [facility], [place] [address] [place], [place] [address]    Report Status 08/30/2019 FINAL  Final  Culture, respiratory     Status: None   Collection Time: 08/30/19  8:48 PM   Specimen: Sputum  Result Value Ref Range Status   Specimen Description   Final    SPU Performed at [place] [address] [place], [place] [address]    Special Requests   Final    NONE Reflexed from [id number] Performed at [facility], [place] [address] [place], [place] [address]    Gram Stain   Final    RARE WBC PRESENT, PREDOMINANTLY PMN RARE GRAM VARIABLE ROD RARE YEAST    Culture   Final    FEW Consistent with normal respiratory flora. Performed at [facility], [place] [address], [place], [place] [address]    Report Status 09/02/2019 FINAL  Final  C difficile quick scan w PCR reflex     Status: None   Collection Time: 08/31/19  7:50 AM   Specimen: STOOL  Result Value Ref Range Status   C Diff antigen NEGATIVE NEGATIVE Final   C Diff toxin NEGATIVE NEGATIVE Final   C Diff interpretation No C. difficile detected.  Final    Comment: Performed at [facility], [place] [place], [place] [address]     CBC Recent Labs  Lab 08/30/19 1244 08/30/19 1329 08/31/19 0524 09/01/19 0509  WBC >14.7*  --  9.1 8.8  HGB 12.4* 12.9* 10.8* 11.9*  HCT 37.2* 38.0* 33.6* 36.2*  PLT 203  --  199 199  MCV 92.3  --  94.1 94.0  MCH 30.8  --  30.3 30.9  MCHC 33.3  --  32.1 32.9  RDW 12.6  --  12.8 12.9    Chemistries  Recent Labs  Lab 08/30/19 1244 08/30/19  1329 08/31/19 0524 09/01/19 0509 09/02/19 0558  NA 140 142 139 144 143  K 3.6 3.7 3.1* 3.4* 3.3*  CL 105 103 106 106 109  CO2 25  --  25 28 28   GLUCOSE 104* 97 91 111* 104*  BUN 28* 25* 28* 24* 21*  CREATININE 1.42* 1.30* 1.52* 1.54* 1.49*  CALCIUM 8.5*  --  8.2* 8.4* 8.2*  MG  --   --   --  1.7 2.1   ------------------------------------------------------------------------------------------------------------------ estimated creatinine clearance is 85.1 mL/min (A) (by C-G formula based on SCr of 1.49 mg/dL (H)). ------------------------------------------------------------------------------------------------------------------ No results for input(s): HGBA1C in the last 72 hours. ------------------------------------------------------------------------------------------------------------------ No results for input(s): CHOL, HDL, LDLCALC, TRIG, CHOLHDL, LDLDIRECT in the last 72 hours. ------------------------------------------------------------------------------------------------------------------ No results for input(s): TSH, T4TOTAL, T3FREE, THYROIDAB in the last 72 hours.  Invalid input(s): FREET3 ------------------------------------------------------------------------------------------------------------------ No results for input(s): VITAMINB12, FOLATE, FERRITIN, TIBC, IRON,  RETICCTPCT in the last 72 hours.  Coagulation profile No results for input(s): INR, PROTIME in the last 168 hours.  No results for input(s): DDIMER in the last 72 hours.  Cardiac Enzymes No results for input(s): CKMB, TROPONINI, MYOGLOBIN in the last 168 hours.  Invalid input(s): CK ------------------------------------------------------------------------------------------------------------------ Invalid input(s): POCBNP   Over 60 minutes were spent face-to-face with the patient during this encounter and >50% of that time was spent on counseling and coordination of care  Inda Coke PA-C on 09/06/2019  at 11:53 AM   **Disclaimer: This note may have been dictated with voice recognition software. Similar sounding words can inadvertently be transcribed and this note may contain transcription errors which may not have been corrected upon publication of note.**

## 2019-09-06 NOTE — Assessment & Plan Note (Signed)
We will recheck BMP today.  I have also reduced his telmisartan/hydrochlorothiazide dosage today.  Further evaluation after BMP results.

## 2019-09-06 NOTE — Telephone Encounter (Signed)
Called and LMVM for patient regarding NP appt added from referral.  New Patient letter/map & calendar was also sent

## 2019-09-06 NOTE — Assessment & Plan Note (Signed)
Does not appear in any acute distress today.  Discussed need for evaluation from pulmonology, so he can determine oxygen use needs as well as make sure his symptoms are improving, as well as pneumonia resolving.  We were able to secure an appointment for tomorrow.  Worsening precautions advised in the interim.

## 2019-09-06 NOTE — Assessment & Plan Note (Signed)
Uncontrolled.  He is having worsening anxiety due to his health.  We are going to increase his Lexapro to 20 mg daily.  He is going to follow-up with me in 1 month, sooner if needed.

## 2019-09-07 ENCOUNTER — Encounter: Payer: Self-pay | Admitting: Internal Medicine

## 2019-09-07 ENCOUNTER — Ambulatory Visit: Payer: BC Managed Care – PPO | Admitting: Internal Medicine

## 2019-09-07 ENCOUNTER — Ambulatory Visit (HOSPITAL_COMMUNITY): Admission: RE | Admit: 2019-09-07 | Payer: BC Managed Care – PPO | Source: Ambulatory Visit

## 2019-09-07 DIAGNOSIS — R918 Other nonspecific abnormal finding of lung field: Secondary | ICD-10-CM | POA: Diagnosis not present

## 2019-09-07 DIAGNOSIS — J9601 Acute respiratory failure with hypoxia: Secondary | ICD-10-CM

## 2019-09-07 DIAGNOSIS — I2699 Other pulmonary embolism without acute cor pulmonale: Secondary | ICD-10-CM | POA: Diagnosis not present

## 2019-09-07 NOTE — Progress Notes (Signed)
Subjective:     Patient ID: Wesley Hancock, male   DOB: 18-Jan-1971,     MRN: [id number]  HPI  31 yowm quit smoking regularly (still smokes when drinking a lot) around 2015  And typically  very active works delivering auto parts then around 2016 new onset variable sob so referred to pulmonary clinic 09/08/2017 by Wesley Hancock   09/08/2017 1st [facility] office visit/ Wesley Hancock   Chief Complaint  Patient presents with  . pulmonary consult    referred by Dr. Morene Hancock for tightness in chest and SOB. pt had a scan done over a year ago with results of lung damage  indolent onset progressively doe x 2 years variably occurring with "almost any activity" but def with steps -   also sob at rest  X sev minutes p relaxing and worse when driving assoc with sense of chest congestion but s excess/ purulent sputum or mucus plugs  And sleeps ok  Severe gerd and sense of pnds during the same period his sob is worse  Never tried inhalers  rec Stop prinizide and start micardis 80-25 one daily in its place and symptoms should gradually resolve  GERD  Diet      Admit date: 08/30/2019 Discharge date: 09/02/2019     Recommendations for Outpatient Follow-up:  1. Follow up with PCP in 1 week 2. Please obtain BMP in one week 3. Continue azithromycin 500 mg p.o. daily for an additional 3 days and cefdinir 300 mg p.o. twice daily for an additional 10 days for pneumonia 4. Please follow up on the following pending results: Sputum culture results, finalization of blood cultures> neg 09/07/2019 f/u  Home Health: No Equipment/Devices: Was on oxygen at home from previous discharge from underlying PE/DVT, titrated off during this hospitalization and no desaturations were noted on ambulation with physical therapy.    History of present illness:  Wesley Hancock is a 48 year old Caucasian gentleman with past medical history remarkable for essential hypertension, GERD, depression, HLD, and recently  diagnosed with PE/DVT on Xarelto, who presented to the [facility] long emergency department with progressive shortness of breath.  Patient was hospitalized 9/22--9/27 for acute bilateral pulmonary emboli, lower extremity DVT, bilateral pneumonia, acute hypoxic respiratory failure and acute kidney injury. He was discharged on Xarelto, Levaquin, steroid taper and 2 L nasal cannula. Acute kidney injury thought multifactorial including contrast injury, ARB, diuretic and Toradol.  Initially felt well when he went home but one day PTA noted increasing shortness of breath and oxygen saturations in the 80s. Breathing status continued to worsen and so he came to the emergency department today for evaluation. He has had some chest pain on the left side from the PE. No specific aggravating or alleviating factors. He has been compliant with Xarelto and antibiotic. No fever at home.  CT showed bilateral pneumonia, bilateral peripheral PE. Treated with ceftriaxone and azithromycin and referred for admission.  Hospital course:  Acute hypoxic respiratory failure Multifocal pneumonia Bilateral pulmonary embolism Patient presenting to ED with progressive shortness of breath. Recently discharged from outside hospital with new diagnosis of PE/DVT and pneumonia. Patient reports compliance with his home Xarelto, and was discharged on 2 L nasal cannula. BNP slightly elevated at 530. WBC count elevated 14.7 on admission. Troponin flat at 23 followed by 21. Recent echocardiogram on 08/24/2019 notable for LVEF 55-65%. CTA PE notable for extensive bilateral airway opacities out of proportion in regards to his pulmonary embolism consistent with multifocal pneumonia. MRSA PCR negative.  Patient was started  on empiric antibiotics with azithromycin and ceftriaxone.  Blood cultures remained negative during the hospitalization.  Sputum culture with rare gram variable rods.  Patient symptoms progressively improved  during hospitalization and he was able to be titrated off of supplemental oxygen without desaturation noted on ambulation with physical therapy at time of discharge.  We will continue antibiotics outpatient with 3 additional days of azithromycin 500 mg p.o. daily in addition to cefdinir 300 mg p.o. twice daily for an additional 10 days.  Continue Xarelto as prescribed previously for PE/DVT.  Recommend follow-up with PCP in 7-10 days following hospitalization.  Bilateral pulmonary embolism Right DVT  Continue Xarelto.  Patient with reported family history of blood clotting in the past, may warrant hypercoagulable work-up once this acute event resolves.  Acute renal failure Most recent creatinine 2.47 on outside hospital discharge on 08/27/2019. Acute kidney injury thought multifactorial including contrast injury, ARB, diuretic and Toradol. Also did have a contrast enhanced CTA on this hospitalization in the ED. creatinine 1.49 at time of discharge.  Recommend repeat BMP in 1 week at PCP visit.  GERD: Continue PPI  Hypokalemia Hypomagnesemia Repleted during hospitalization.  Depression: Continue Lexapro  Diarrhea C. difficile negative on admission.  Continue Imodium prn.  If persists, may warrant outpatient gastroenterology evaluation.  [id number] statin    09/07/2019  Re-establish ov/Wesley Hancock re: f/u sp pe  Admit same day  9/322/20 Chief Complaint  Patient presents with  . Hospitalization Follow-up    recent PNA and bilateral PE. Breathing is improving but not back to baseline.   Dyspnea:  Got worse p started xarelto though cp resolved - now 50% baseline  Cough: assoc with hemoptysis / still a little tight with deep breath / never fever no rigors / cough has mostly resolved now   Sleeping: able to lie flat/ two pillows  SABA use: none needed  02: has as needed stopped using it / sats 92% with activity    No obvious day to day or daytime variability or ongoing  assoc excess/  purulent sputum or mucus plugs or hemoptysis or cp or chest tightness, subjective wheeze or overt sinus or hb symptoms.   Sleeping ok  without nocturnal  or early am exacerbation  of respiratory  c/o's or need for noct saba. Also denies any obvious fluctuation of symptoms with weather or environmental changes or other aggravating or alleviating factors except as outlined above   No unusual exposure hx or h/o childhood pna/ asthma or knowledge of premature birth.  Current Allergies, Complete Past Medical History, Past Surgical History, Family History, and Social History were reviewed in [employer] record.  ROS  The following are not active complaints unless bolded Hoarseness, sore throat, dysphagia, dental problems, itching, sneezing,  nasal congestion or discharge of excess mucus or purulent secretions, ear ache,   fever, chills, sweats, unintended wt loss or wt gain, classically pleuritic or exertional cp,  orthopnea pnd or arm/hand swelling  or leg swelling, presyncope, palpitations, abdominal pain, anorexia, nausea, vomiting, diarrhea  or change in bowel habits or change in bladder habits, change in stools or change in urine, dysuria, hematuria,  rash, arthralgias, visual complaints, headache, numbness, weakness or ataxia or problems with walking or coordination,  change in mood or  memory.        Current Meds  Medication Sig  . acetaminophen (TYLENOL) 500 MG tablet Take 1,000 mg by mouth every 6 (six) hours as needed for mild pain.  Marland Kitchen albuterol (VENTOLIN HFA)  108 (90 Base) MCG/ACT inhaler Inhale 1-2 puffs into the lungs every 6 (six) hours as needed for wheezing or shortness of breath.  Marland Kitchen atorvastatin (LIPITOR) 10 MG tablet Take 1 tablet (10 mg total) by mouth daily.  . cefdinir (OMNICEF) 300 MG capsule Take 1 capsule (300 mg total) by mouth 2 (two) times daily for 10 days.  Marland Kitchen escitalopram (LEXAPRO) 10 MG tablet Take 1 tablet (10 mg total) by mouth daily.  . fluticasone  (FLONASE) 50 MCG/ACT nasal spray Place 1 spray into both nostrils daily.   . nitroGLYCERIN (NITROSTAT) 0.4 MG SL tablet Place 1 tablet (0.4 mg total) under the tongue every 5 (five) minutes as needed for chest pain.  Marland Kitchen omeprazole (PRILOSEC) 20 MG capsule Take 20 mg by mouth 2 (two) times daily before a meal.  . Rivaroxaban (XARELTO) 15 MG TABS tablet Take 15 mg by mouth 2 (two) times daily with a meal.  . telmisartan-hydrochlorothiazide (MICARDIS HCT) 40-12.5 MG tablet Take 1 tablet by mouth daily.                           Objective:   Physical Exam    Amb wm nad   09/07/2019       275   09/08/17 254 lb 2 oz (115.3 kg)  08/19/17 253 lb (114.8 kg)  08/04/17 254 lb (115.2 kg)     Vital signs reviewed - Note on arrival 02 sats  96% on RA    HEENT : pt wearing mask not removed for exam due to covid -19 concerns.    NECK :  without JVD/Nodes/TM/ nl carotid upstrokes bilaterally   LUNGS: no acc muscle use,  Nl contour chest which is clear to A and P bilaterally without cough on insp or exp maneuvers   CV:  RRR  no s3 or murmur or increase in P2, and no edema - wearing elastic knee high stockings.   ABD:  Markedly obese soft and nontender with nl inspiratory excursion in the supine position. No bruits or organomegaly appreciated, bowel sounds nl  MS:  Nl gait/ ext warm without deformities, calf tenderness, cyanosis or clubbing No obvious joint restrictions   SKIN: warm and dry without lesions    NEURO:  alert, approp, nl sensorium with  no motor or cerebellar deficits apparent.          Assessment:

## 2019-09-07 NOTE — Patient Instructions (Addendum)
Will stop your 02 at this point   Please schedule a follow up office visit in 4 weeks, sooner if needed with cxr on return

## 2019-09-10 ENCOUNTER — Encounter: Payer: Self-pay | Admitting: Internal Medicine

## 2019-09-10 DIAGNOSIS — R918 Other nonspecific abnormal finding of lung field: Secondary | ICD-10-CM

## 2019-09-10 HISTORY — DX: Other nonspecific abnormal finding of lung field: R91.8

## 2019-09-10 HISTORY — DX: Morbid (severe) obesity due to excess calories: E66.01

## 2019-09-10 NOTE — Assessment & Plan Note (Addendum)
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

## 2019-09-10 NOTE — Assessment & Plan Note (Addendum)
In setting of bilateral PE 08/23/2019   Assoc with hemoptysis but no fever or which is more typical of infarcts/ hemorrhage than working dx of pna but point is moot at this point as he is doing much better and completing course of omnicef as planned with no further hemoptysis or cp at this point.   Rec: F/u here in 4 weeks with final cxr  Call if losing ground in meantime  > 50% of this 40 min post hosp office visit was spent reviewing two different hosp admits and dx/rx of PE / dvt   Each maintenance medication was reviewed in detail including most importantly the difference between maintenance and as needed and under what circumstances the prns are to be used.  Please see AVS for specific  Instructions which are unique to this visit and I personally typed out  which were reviewed in detail in writing with the patient and a copy provided.

## 2019-09-10 NOTE — Assessment & Plan Note (Signed)
Body mass index is 36.28 kg/m.  -  trending up No results found for: TSH   Contributing to gerd risk/ doe/reviewed the need and the process to achieve and maintain neg calorie balance > defer f/u primary care including intermittently monitoring thyroid status

## 2019-09-10 NOTE — Assessment & Plan Note (Signed)
Resolved s/p PE / ok to d/c 02

## 2019-09-18 ENCOUNTER — Other Ambulatory Visit: Payer: Self-pay | Admitting: Family

## 2019-09-18 DIAGNOSIS — I2699 Other pulmonary embolism without acute cor pulmonale: Secondary | ICD-10-CM

## 2019-09-18 DIAGNOSIS — I82409 Acute embolism and thrombosis of unspecified deep veins of unspecified lower extremity: Secondary | ICD-10-CM

## 2019-09-19 ENCOUNTER — Inpatient Hospital Stay (HOSPITAL_BASED_OUTPATIENT_CLINIC_OR_DEPARTMENT_OTHER): Payer: BC Managed Care – PPO | Admitting: Family

## 2019-09-19 ENCOUNTER — Telehealth: Payer: Self-pay | Admitting: Family

## 2019-09-19 ENCOUNTER — Other Ambulatory Visit: Payer: Self-pay

## 2019-09-19 ENCOUNTER — Inpatient Hospital Stay: Payer: BC Managed Care – PPO | Attending: Family

## 2019-09-19 ENCOUNTER — Encounter: Payer: Self-pay | Admitting: Family

## 2019-09-19 VITALS — BP 138/97 | HR 101 | Temp 97.1°F | Resp 18 | Ht 73.0 in | Wt 275.0 lb

## 2019-09-19 DIAGNOSIS — Z87891 Personal history of nicotine dependence: Secondary | ICD-10-CM | POA: Insufficient documentation

## 2019-09-19 DIAGNOSIS — D649 Anemia, unspecified: Secondary | ICD-10-CM

## 2019-09-19 DIAGNOSIS — Z7901 Long term (current) use of anticoagulants: Secondary | ICD-10-CM | POA: Insufficient documentation

## 2019-09-19 DIAGNOSIS — Z800 Family history of malignant neoplasm of digestive organs: Secondary | ICD-10-CM | POA: Diagnosis not present

## 2019-09-19 DIAGNOSIS — I2699 Other pulmonary embolism without acute cor pulmonale: Secondary | ICD-10-CM

## 2019-09-19 DIAGNOSIS — I82409 Acute embolism and thrombosis of unspecified deep veins of unspecified lower extremity: Secondary | ICD-10-CM

## 2019-09-19 LAB — CBC WITH DIFFERENTIAL (CANCER CENTER ONLY)
Abs Immature Granulocytes: 0.04 10*3/uL (ref 0.00–0.07)
Basophils Absolute: 0 10*3/uL (ref 0.0–0.1)
Basophils Relative: 1 %
Eosinophils Absolute: 0.5 10*3/uL (ref 0.0–0.5)
Eosinophils Relative: 8 %
HCT: 38.1 % — ABNORMAL LOW (ref 39.0–52.0)
Hemoglobin: 13 g/dL (ref 13.0–17.0)
Immature Granulocytes: 1 %
Lymphocytes Relative: 18 %
Lymphs Abs: 1 10*3/uL (ref 0.7–4.0)
MCH: 30.6 pg (ref 26.0–34.0)
MCHC: 34.1 g/dL (ref 30.0–36.0)
MCV: 89.6 fL (ref 80.0–100.0)
Monocytes Absolute: 0.6 10*3/uL (ref 0.1–1.0)
Monocytes Relative: 10 %
Neutro Abs: 3.6 10*3/uL (ref 1.7–7.7)
Neutrophils Relative %: 62 %
Platelet Count: 268 10*3/uL (ref 150–400)
RBC: 4.25 MIL/uL (ref 4.22–5.81)
RDW: 12.4 % (ref 11.5–15.5)
WBC Count: 5.7 10*3/uL (ref 4.0–10.5)
nRBC: 0 % (ref 0.0–0.2)

## 2019-09-19 LAB — CMP (CANCER CENTER ONLY)
ALT: 28 U/L (ref 0–44)
AST: 18 U/L (ref 15–41)
Albumin: 4.2 g/dL (ref 3.5–5.0)
Alkaline Phosphatase: 63 U/L (ref 38–126)
Anion gap: 9 (ref 5–15)
BUN: 20 mg/dL (ref 6–20)
CO2: 28 mmol/L (ref 22–32)
Calcium: 9.5 mg/dL (ref 8.9–10.3)
Chloride: 99 mmol/L (ref 98–111)
Creatinine: 1.16 mg/dL (ref 0.61–1.24)
GFR, Est AFR Am: >60 mL/min (ref >60)
GFR, Estimated: >60 mL/min (ref >60)
Glucose, Bld: 147 mg/dL — ABNORMAL HIGH (ref 70–99)
Potassium: 3.3 mmol/L — ABNORMAL LOW (ref 3.5–5.1)
Sodium: 136 mmol/L (ref 135–145)
Total Bilirubin: 0.7 mg/dL (ref 0.3–1.2)
Total Protein: 7.6 g/dL (ref 6.5–8.1)

## 2019-09-19 LAB — IRON AND TIBC
Iron: 43 ug/dL (ref 42–163)
Saturation Ratios: 12 % — ABNORMAL LOW (ref 20–55)
TIBC: 361 ug/dL (ref 202–409)
UIBC: 318 ug/dL (ref 117–376)

## 2019-09-19 LAB — RETICULOCYTES
Immature Retic Fract: 8.1 % (ref 2.3–15.9)
RBC.: 4.29 MIL/uL (ref 4.22–5.81)
Retic Count, Absolute: 78.9 10*3/uL (ref 19.0–186.0)
Retic Ct Pct: 1.8 % (ref 0.4–3.1)

## 2019-09-19 LAB — LACTATE DEHYDROGENASE: LDH: 253 U/L — ABNORMAL HIGH (ref 98–192)

## 2019-09-19 LAB — ANTITHROMBIN III: AntiThromb III Func: 89 % (ref 75–120)

## 2019-09-19 LAB — FERRITIN: Ferritin: 310 ng/mL (ref 24–336)

## 2019-09-19 MED ORDER — RIVAROXABAN 20 MG PO TABS: 20.0000 mg | ORAL_TABLET | Freq: Every day | ORAL | 6 refills | Status: DC

## 2019-09-19 NOTE — Telephone Encounter (Signed)
Called and LMVM for patient regarding follow up appointments added per 10/20 los

## 2019-09-19 NOTE — Progress Notes (Addendum)
Hematology/Oncology Consultation   Name: Wesley Hancock      MRN: [id number]    Location: Room/bed info not found  Date: 09/19/2019 Time:8:32 AM   REFERRING PHYSICIAN: Inda Coke, PA  REASON FOR CONSULT: Multiple bilateral subsegmental pulmonary emboli and DVT   DIAGNOSIS: Bilateral pulmonary emboli with no heart strain and right lower extremity DVT  HISTORY OF PRESENT ILLNESS: Wesley Hancock is a very pleasant 48 yo caucasian gentleman with diagnosis in September of bilateral pulmonary emboli, no heart strain, and right lower extremity DVT. He also had acute kidney injury during his admission and pneumonia.  He states that over the last 3 years or so he has noted that he becomes winded easily. He went to the ED in September with severe mid back pain that radiated around the left side.  He had not done any traveling and sustained any injuries.  He is not using any hormone or dietary supplements.  He quit smoking 5 years ago and quit rubbing snuff 1 year ago.  He is doing well on Xarelto and verbalized that he is taking as prescribed.  No episodes of bleeding. No bruising or petechiae.  He still gets a little minded at times with over exertion and will get a little sore with deep inhalation. He finished his antibiotics for pneumonia (daignosed with thrombus) last week.  He is wearing compression stockings during the day. No swelling, tenderness, numbness or tingling in her extremities at this time.  No history of diabetes or thyroid disease.  Patient has no personal history of cancer.  His father had history of multiple blood clots that were present prior to his diagnosis with lung and prostate cancer.  His maternal grandfather had an unknown primary as well history of a stroke.  He has had endoscopies and colonscopies in the past due to GI issues. He states that he thinks he had polyps removed years ago while living in [place]. He is not established with GI here.  He has maintained a  good appetite and is staying well hydrated. His weight is described as stable.  No fever, chills, n/v, cough, rash, dizziness, chest pain, palpitations, abdominal pain or changes in bowel or bladder habits.  No falls or syncope.  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   ROS: All other 10 point review of systems is negative.   PAST MEDICAL HISTORY:   Past Medical History:  Diagnosis Date  . Depression   . DVT (deep venous thrombosis) ([place])   . High cholesterol   . Hyperlipidemia   . Nutcracker esophagus     ALLERGIES: Allergies  Allergen Reactions  . Diltiazem Hcl Diarrhea    Lethargic as well      MEDICATIONS:  Current Outpatient Medications on File Prior to Visit  Medication Sig Dispense Refill  . acetaminophen (TYLENOL) 500 MG tablet Take 1,000 mg by mouth every 6 (six) hours as needed for mild pain.    Marland Kitchen atorvastatin (LIPITOR) 10 MG tablet Take 1 tablet (10 mg total) by mouth daily. 30 tablet 5  . escitalopram (LEXAPRO) 10 MG tablet Take 1 tablet (10 mg total) by mouth daily. 30 tablet 5  . fluticasone (FLONASE) 50 MCG/ACT nasal spray Place 1 spray into both nostrils daily.     Marland Kitchen omeprazole (PRILOSEC) 20 MG capsule Take 20 mg by mouth 2 (two) times daily before a meal.    . Rivaroxaban (XARELTO) 15 MG TABS tablet Take 15 mg by mouth 2 (two)  times daily with a meal.    . telmisartan-hydrochlorothiazide (MICARDIS HCT) 40-12.5 MG tablet Take 1 tablet by mouth daily. 30 tablet 0  . albuterol (VENTOLIN HFA) 108 (90 Base) MCG/ACT inhaler Inhale 1-2 puffs into the lungs every 6 (six) hours as needed for wheezing or shortness of breath. 18 g 0  . nitroGLYCERIN (NITROSTAT) 0.4 MG SL tablet Place 1 tablet (0.4 mg total) under the tongue every 5 (five) minutes as needed for chest pain. (Patient not taking: Reported on 09/19/2019) 15 tablet 2   No current facility-administered medications on file prior to visit.      PAST SURGICAL HISTORY Past Surgical  History:  Procedure Laterality Date  . CHOLECYSTECTOMY      FAMILY HISTORY: Family History  Problem Relation Age of Onset  . Diabetes Mother   . Hyperlipidemia Mother   . Hypertension Mother   . Heart disease Father 9  . Hyperlipidemia Father   . Hypertension Father   . Prostate cancer Father 56  . Lung cancer Father 62       Dx 06/18/2017  . Diabetes Sister   . Hyperlipidemia Sister   . Hypertension Sister   . Diabetes Maternal Grandmother   . Heart disease Maternal Grandmother   . Hyperlipidemia Maternal Grandmother   . Hypertension Maternal Grandmother   . Colon cancer Maternal Grandmother   . Heart disease Maternal Grandfather   . Hyperlipidemia Maternal Grandfather   . Hypertension Maternal Grandfather   . Stroke Maternal Grandfather   . Liver cancer Maternal Grandfather   . Heart disease Paternal Grandmother     SOCIAL HISTORY:  reports that he quit smoking about 5 years ago. His smoking use included cigarettes. He has a 20.00 pack-year smoking history. He has never used smokeless tobacco. He reports current alcohol use of about 5.0 standard drinks of alcohol per week. He reports that he does not use drugs.  PERFORMANCE STATUS: The patient's performance status is 1 - Symptomatic but completely ambulatory  PHYSICAL EXAM: Most Recent Vital Signs: Blood pressure (!) 138/97, pulse (!) 101, temperature (!) 97.1 F (36.2 C), temperature source Temporal, resp. rate 18, height 6\' 1"  (1.854 m), weight 275 lb (124.7 kg), SpO2 97 %. BP (!) 138/97 (BP Location: Right Arm, Patient Position: Sitting)   Pulse (!) 101   Temp (!) 97.1 F (36.2 C) (Temporal)   Resp 18   Ht 6\' 1"  (1.854 m)   Wt 275 lb (124.7 kg)   SpO2 97%   BMI 36.28 kg/m   General Appearance:    Alert, cooperative, no distress, appears stated age  Head:    Normocephalic, without obvious abnormality, atraumatic  Eyes:    PERRL, conjunctiva/corneas clear, EOM's intact, fundi    benign, both eyes              Throat:   Lips, mucosa, and tongue normal; teeth and gums normal  Neck:   Supple, symmetrical, trachea midline, no adenopathy;       thyroid:  No enlargement/tenderness/nodules; no carotid   bruit or JVD  Back:     Symmetric, no curvature, ROM normal, no CVA tenderness  Lungs:     Clear to auscultation bilaterally, respirations unlabored  Chest wall:    No tenderness or deformity  Heart:    Regular rate and rhythm, S1 and S2 normal, no murmur, rub   or gallop  Abdomen:     Soft, non-tender, bowel sounds active all four quadrants,    no masses, no  organomegaly        Extremities:   Extremities normal, atraumatic, no cyanosis or edema  Pulses:   2+ and symmetric all extremities  Skin:   Skin color, texture, turgor normal, no rashes or lesions  Lymph nodes:   Cervical, supraclavicular, and axillary nodes normal  Neurologic:   CNII-XII intact. Normal strength, sensation and reflexes      throughout    LABORATORY DATA:  Results for orders placed or performed in visit on 09/19/19 (from the past 48 hour(s))  CBC with Differential ([facility] Only)     Status: Abnormal   Collection Time: 09/19/19  7:59 AM  Result Value Ref Range   WBC Count 5.7 4.0 - 10.5 K/uL   RBC 4.25 4.22 - 5.81 MIL/uL   Hemoglobin 13.0 13.0 - 17.0 g/dL   HCT 38.1 (L) 39.0 - 52.0 %   MCV 89.6 80.0 - 100.0 fL   MCH 30.6 26.0 - 34.0 pg   MCHC 34.1 30.0 - 36.0 g/dL   RDW 12.4 11.5 - 15.5 %   Platelet Count 268 150 - 400 K/uL   nRBC 0.0 0.0 - 0.2 %   Neutrophils Relative % 62 %   Neutro Abs 3.6 1.7 - 7.7 K/uL   Lymphocytes Relative 18 %   Lymphs Abs 1.0 0.7 - 4.0 K/uL   Monocytes Relative 10 %   Monocytes Absolute 0.6 0.1 - 1.0 K/uL   Eosinophils Relative 8 %   Eosinophils Absolute 0.5 0.0 - 0.5 K/uL   Basophils Relative 1 %   Basophils Absolute 0.0 0.0 - 0.1 K/uL   Immature Granulocytes 1 %   Abs Immature Granulocytes 0.04 0.00 - 0.07 K/uL    Comment: Performed at [facility] at  [facility], [address], [place], [place] [address]  Reticulocytes     Status: None   Collection Time: 09/19/19  8:02 AM  Result Value Ref Range   Retic Ct Pct 1.8 0.4 - 3.1 %   RBC. 4.29 4.22 - 5.81 MIL/uL   Retic Count, Absolute 78.9 19.0 - 186.0 K/uL   Immature Retic Fract 8.1 2.3 - 15.9 %    Comment: Performed at [facility] at [facility], [address] [place], [place] [address]      RADIOGRAPHY: No results found.     PATHOLOGY: None  ASSESSMENT/PLAN: Wesley Hancock is a very pleasant 48 yo caucasian gentleman with diagnosis in September of bilateral pulmonary emboli, no heart strain, and right lower extremity DVT.  He is doing well on Xarelto and will continue on 20 mg PO daily.  Hypercoag panel is pending. If he is positive for antiphospholipid antibody syndrome we will switch him over to Coumadin.  Referral placed for GI for further work up.  We will plan to see him back in another 3 months.   All questions were answered and he is in agreement with the plan. He will contact our office with any questions or concerns. We can certainly see him sooner if needed.   He was discussed with and also seen by Wesley Hancock and he is in agreement with the aforementioned.   Wesley Hancock,      Addendum:  I saw and evaluated the patient and reviewed Wesley Hancock's note. I agree with the findings and plan as detailed.    I reviewed the patient's records in detail, including recent ER records, lab studies, and imaging results.  I  also independently reviewed the radiologic images of recent CTA chest, and agree with findings documented.  In summary, patient presented to [facility] ED for chest pain and was found with bilateral PTE and RLE DVT involving the right femoral vein.  He was discharged with Xarelto, but was readmitted to [facility] for pneumonia.  His hospitalization was also complicated by AKI, possibly due to IV contrast.  Since the most  recent discharge, his breathing has improved significantly, and he denies any recurrent lower extremity swelling, chest pain, or dyspnea.  He is tolerating Xarelto well without any abnormal bleeding or bruising.    I reviewed with the patient about the plan for care for DVT and PTE.  This last episode of blood clot appeared to be unprovoked.  Given his young age, we have ordered hypercoagulable work-up, including Factor V Leiden, prothrombin gene mutation, and antiphospholipid syndrome.  The goal of anticoagulation is lifelong due to the extensiveness of thromboembolism.  I recommend the patient to use elastic compression stockings at 20-30 mmHg to reduce risks of chronic thrombophlebitis.  Finally, I reinforced the importance of preventive strategies such as avoiding hormonal supplement, avoiding cigarette smoking, keeping up-to-date with screening programs for early cancer detection, frequent ambulation for long distance travel and aggressive DVT prophylaxis in all surgical settings.  Should he need any interruption of the anticoagulation for elective procedures in the future, feel free to contact me regarding peri-operative management.  In addition to DVT and PTE, his labs were also notable for recent anemia, and we have referred the patient to gastroenterology for colon cancer screening, given his family history of malignancies.  Tish Men, MD 09/19/2019 3:47 PM

## 2019-09-20 LAB — BETA-2-GLYCOPROTEIN I ABS, IGG/M/A
Beta-2 Glyco I IgG: 102 GPI IgG units — ABNORMAL HIGH (ref 0–20)
Beta-2-Glycoprotein I IgA: 50 GPI IgA units — ABNORMAL HIGH (ref 0–25)
Beta-2-Glycoprotein I IgM: 10 GPI IgM units (ref 0–32)

## 2019-09-20 LAB — HOMOCYSTEINE: Homocysteine: 13.2 umol/L (ref 0.0–14.5)

## 2019-09-21 ENCOUNTER — Other Ambulatory Visit: Payer: Self-pay | Admitting: Family

## 2019-09-21 DIAGNOSIS — I2699 Other pulmonary embolism without acute cor pulmonale: Secondary | ICD-10-CM

## 2019-09-21 DIAGNOSIS — I82409 Acute embolism and thrombosis of unspecified deep veins of unspecified lower extremity: Secondary | ICD-10-CM

## 2019-09-21 LAB — CARDIOLIPIN ANTIBODIES, IGG, IGM, IGA
Anticardiolipin IgA: 14 APL U/mL — ABNORMAL HIGH (ref 0–11)
Anticardiolipin IgG: 93 GPL U/mL — ABNORMAL HIGH (ref 0–14)
Anticardiolipin IgM: 18 MPL U/mL — ABNORMAL HIGH (ref 0–12)

## 2019-09-21 LAB — PROTEIN S ACTIVITY

## 2019-09-21 LAB — LUPUS ANTICOAGULANT PANEL

## 2019-09-21 LAB — PROTEIN C ACTIVITY

## 2019-09-21 LAB — PROTEIN C, TOTAL

## 2019-09-21 LAB — PROTEIN S, TOTAL

## 2019-09-25 LAB — PROTHROMBIN GENE MUTATION

## 2019-09-25 LAB — FACTOR 5 LEIDEN

## 2019-09-26 ENCOUNTER — Telehealth: Payer: Self-pay | Admitting: Physician Assistant

## 2019-09-26 ENCOUNTER — Other Ambulatory Visit: Payer: Self-pay

## 2019-09-26 ENCOUNTER — Emergency Department (HOSPITAL_COMMUNITY): Payer: BC Managed Care – PPO

## 2019-09-26 ENCOUNTER — Encounter (HOSPITAL_COMMUNITY): Payer: Self-pay | Admitting: Emergency Medicine

## 2019-09-26 ENCOUNTER — Emergency Department (HOSPITAL_COMMUNITY)
Admission: EM | Admit: 2019-09-26 | Discharge: 2019-09-26 | Disposition: A | Discharge status: Home / Self Care | Payer: BC Managed Care – PPO | Attending: Emergency Medicine | Admitting: Emergency Medicine

## 2019-09-26 DIAGNOSIS — D530 Protein deficiency anemia: Secondary | ICD-10-CM | POA: Diagnosis not present

## 2019-09-26 DIAGNOSIS — Z79899 Other long term (current) drug therapy: Secondary | ICD-10-CM | POA: Insufficient documentation

## 2019-09-26 DIAGNOSIS — I10 Essential (primary) hypertension: Secondary | ICD-10-CM | POA: Insufficient documentation

## 2019-09-26 DIAGNOSIS — Z87891 Personal history of nicotine dependence: Secondary | ICD-10-CM | POA: Diagnosis not present

## 2019-09-26 DIAGNOSIS — R05 Cough: Secondary | ICD-10-CM | POA: Diagnosis present

## 2019-09-26 DIAGNOSIS — R042 Hemoptysis: Secondary | ICD-10-CM | POA: Insufficient documentation

## 2019-09-26 DIAGNOSIS — Z7901 Long term (current) use of anticoagulants: Secondary | ICD-10-CM | POA: Diagnosis not present

## 2019-09-26 NOTE — ED Notes (Signed)
Pt refuses discharge vital signs. 

## 2019-09-26 NOTE — Telephone Encounter (Signed)
°  Caller states he is coughing up blood. He was in hospital for nearly two weeks with pneumonia and PE's. He was discharged about two weeks ago. Then started with coughing up chunks of blood. S/S first day of coughing.   Disposition: Go to ED now  PER [facility]

## 2019-09-26 NOTE — Telephone Encounter (Signed)
FYI, pt went to ED. 

## 2019-09-26 NOTE — ED Triage Notes (Signed)
Pt reports started cough yesterday. Today when coughed had dark blood in mucous. Is taking Xeralto for PEs. Recently was admitted for PNA as well.

## 2019-09-26 NOTE — ED Provider Notes (Signed)
[facility] DEPT Provider Note   CSN: [id number] Arrival date & time: 09/26/19  0756     History   Chief Complaint Chief Complaint  Patient presents with  . Hemoptysis    HPI Boedy Sadri is a 48 y.o. male.     Patient is a 48 year old male with a significant history of protein S deficiency with bilateral DVTs and PEs currently started Xarelto 30 days ago but was complicated with admission to the hospital for bilateral pneumonia improved after IV and oral antibiotics who is presenting today with hemoptysis.  Patient has not missed any doses of his blood thinner and states he has been doing relatively well but yesterday was not a good day he had generalized malaise aches and pains and today when he woke up he had a coughing fit where he coughed up 2 or 3 episodes of 1 teaspoon of blood that was bright red and not clotted.  He noticed a little bit of tightness in his chest during the event but no significant shortness of breath.  He has not recently had fever, congestion but states when he was hospitalized and prior to diagnosis he was also coughing up blood.  He spoke with his specialist today and they recommended he come for evaluation.  He has not had any further coughing of blood since he left his home this morning so it has been several hours.  He denies feeling short of breath and does check his pulse ox at home regularly.  He has had no abdominal pain or vomiting.  His legs do ache regularly but it is no different than what he has been experiencing.  The history is provided by the patient.    Past Medical History:  Diagnosis Date  . Depression   . DVT (deep venous thrombosis) ([place])   . High cholesterol   . Hyperlipidemia   . Nutcracker esophagus     Patient Active Problem List   Diagnosis Date Noted  . Pulmonary infiltrates 09/10/2019  . Morbid obesity due to excess calories ([place]) c/b hbp/ dvt/PE 09/10/2019  . Acute nonintractable  headache 09/06/2019  . Lobar pneumonia ([place]) 08/30/2019  . Acute respiratory failure with hypoxia ([place]) 08/30/2019  . AKI (acute kidney injury) ([place]) 08/30/2019  . Pulmonary emboli ([place]) 08/30/2019  . DVT (deep venous thrombosis) ([place]) 08/30/2019  . Dyspnea on exertion 09/08/2017  . High cholesterol 06/24/2017  . Depression 06/24/2017  . GAD (generalized anxiety disorder) 08/06/2016  . Essential hypertension 09/23/2014    Past Surgical History:  Procedure Laterality Date  . CHOLECYSTECTOMY          Home Medications    Prior to Admission medications   Medication Sig Start Date End Date Taking? Authorizing Provider  acetaminophen (TYLENOL) 500 MG tablet Take 1,000 mg by mouth every 6 (six) hours as needed for mild pain.   Yes [provider]  albuterol (VENTOLIN HFA) 108 (90 Base) MCG/ACT inhaler Inhale 1-2 puffs into the lungs every 6 (six) hours as needed for wheezing or shortness of breath. 09/06/19  Yes Inda Coke, PA  atorvastatin (LIPITOR) 10 MG tablet Take 1 tablet (10 mg total) by mouth daily. 06/19/19  Yes Inda Coke, PA  escitalopram (LEXAPRO) 10 MG tablet Take 1 tablet (10 mg total) by mouth daily. Patient taking differently: Take 20 mg by mouth daily.  06/19/19  Yes [place], Aldona Bar, PA  fluticasone (FLONASE) 50 MCG/ACT nasal spray Place 1 spray into both nostrils daily.    Yes  [provider]  omeprazole (PRILOSEC) 20 MG capsule Take 20 mg by mouth 2 (two) times daily before a meal.   Yes [provider]  rivaroxaban (XARELTO) 20 MG TABS tablet Take 1 tablet (20 mg total) by mouth daily with supper. Patient taking differently: Take 20 mg by mouth daily.  09/19/19  Yes [place], Holli Humbles, NP  telmisartan-hydrochlorothiazide (MICARDIS HCT) 40-12.5 MG tablet Take 1 tablet by mouth daily. 09/06/19  Yes Inda Coke, PA  nitroGLYCERIN (NITROSTAT) 0.4 MG SL tablet Place 1 tablet (0.4 mg total) under the tongue every 5 (five) minutes as  needed for chest pain. Patient not taking: Reported on 09/19/2019 03/07/18   Inda Coke, PA    Family History Family History  Problem Relation Age of Onset  . Diabetes Mother   . Hyperlipidemia Mother   . Hypertension Mother   . Heart disease Father 24  . Hyperlipidemia Father   . Hypertension Father   . Prostate cancer Father 30  . Lung cancer Father 60       Dx 06/18/2017  . Diabetes Sister   . Hyperlipidemia Sister   . Hypertension Sister   . Diabetes Maternal Grandmother   . Heart disease Maternal Grandmother   . Hyperlipidemia Maternal Grandmother   . Hypertension Maternal Grandmother   . Colon cancer Maternal Grandmother   . Heart disease Maternal Grandfather   . Hyperlipidemia Maternal Grandfather   . Hypertension Maternal Grandfather   . Stroke Maternal Grandfather   . Liver cancer Maternal Grandfather   . Heart disease Paternal Grandmother     Social History Social History   Tobacco Use  . Smoking status: Former Smoker    Packs/day: 1.00    Years: 20.00    Pack years: 20.00    Types: Cigarettes    Quit date: 11/30/2013    Years since quitting: 5.8  . Smokeless tobacco: Never Used  Substance Use Topics  . Alcohol use: Yes    Alcohol/week: 5.0 standard drinks    Types: 5 Cans of beer per week  . Drug use: No     Allergies   Diltiazem hcl   Review of Systems Review of Systems  All other systems reviewed and are negative.    Physical Exam Updated Vital Signs BP (!) 159/114 (BP Location: Left Arm)   Pulse 93   Temp 98.8 F (37.1 C) (Oral)   Resp 17   SpO2 100%   Physical Exam Vitals signs and nursing note reviewed.  Constitutional:      General: He is not in acute distress.    Appearance: He is well-developed. He is obese.  HENT:     Head: Normocephalic and atraumatic.     Mouth/Throat:     Mouth: Mucous membranes are moist.  Eyes:     Conjunctiva/sclera: Conjunctivae normal.     Pupils: Pupils are equal, round, and reactive to  light.  Neck:     Musculoskeletal: Normal range of motion and neck supple.  Cardiovascular:     Rate and Rhythm: Normal rate and regular rhythm.     Pulses: Normal pulses.     Heart sounds: No murmur.  Pulmonary:     Effort: Pulmonary effort is normal. No respiratory distress.     Breath sounds: Normal breath sounds. No wheezing or rales.  Musculoskeletal: Normal range of motion.        General: No tenderness.     Comments: Compression stockings in place bilaterally  Skin:    General:  Skin is warm and dry.     Findings: No erythema or rash.  Neurological:     Mental Status: He is alert and oriented to person, place, and time.  Psychiatric:        Behavior: Behavior normal.      ED Treatments / Results  Labs (all labs ordered are listed, but only abnormal results are displayed) Labs Reviewed - No data to display  EKG None  Radiology Dg Chest 2 View  Result Date: 09/26/2019 CLINICAL DATA:  Coughing up blood EXAM: CHEST - 2 VIEW COMPARISON:  Chest CT 08/30/2019 FINDINGS: Improved airspace disease when compared to prior. No visible residual pleural fluid. Normal heart size and mediastinal contours. IMPRESSION: Partial clearing of airspace disease seen on prior. Resolved left pleural effusion. Electronically Signed   By: Monte Fantasia M.D.   On: 09/26/2019 09:59    Procedures Procedures (including critical care time)  Medications Ordered in ED Medications - No data to display   Initial Impression / Assessment and Plan / ED Course  I have reviewed the triage vital signs and the nursing notes.  Pertinent labs & imaging results that were available during my care of the patient were reviewed by me and considered in my medical decision making (see chart for details).       48 year old male with multiple medical problems presenting today with hemoptysis.  Patient had 2 or 3 episodes of coughing up a teaspoon of blood during a coughing fit today.  He initially did have  some chest tightness that is almost resolved now.  Patient has no history of asthma and is not wheezing on exam.  Breath sounds are clear and x-ray shows partial clearing of airspace disease and resolved the left pleural effusion.  Patient does take Xarelto because of recent DVTs and PEs but is in no acute distress at this time.  Respirations are 17 and he is oxygenating 100% on room air.  He is well-appearing and able to speak in full sentences.  Low suspicion for massive hemorrhage at this time.  Most likely related to forceful coughing and known PEs.  Patient given reassurance and strict return precautions.  Final Clinical Impressions(s) / ED Diagnoses   Final diagnoses:  Hemoptysis    ED Discharge Orders    None       Blanchie Dessert, MD 09/26/19 1120

## 2019-09-26 NOTE — Discharge Instructions (Signed)
Continue to take all your medications as prescribed and follow-up with your pulmonologist and hematologist.  If you start coughing large amounts of blood in it does not stop or you are having shortness of breath please return immediately.

## 2019-09-28 ENCOUNTER — Other Ambulatory Visit: Payer: Self-pay | Admitting: Family

## 2019-09-28 ENCOUNTER — Telehealth: Payer: Self-pay | Admitting: Family

## 2019-09-28 DIAGNOSIS — I2699 Other pulmonary embolism without acute cor pulmonale: Secondary | ICD-10-CM

## 2019-09-28 DIAGNOSIS — I82409 Acute embolism and thrombosis of unspecified deep veins of unspecified lower extremity: Secondary | ICD-10-CM

## 2019-09-28 MED ORDER — ENOXAPARIN SODIUM 120 MG/0.8ML ~~LOC~~ SOLN: 120.0000 mg | Freq: Two times a day (BID) | SUBCUTANEOUS | 11 refills | Status: DC

## 2019-09-28 MED FILL — ENOXAPARIN 120 MG/0.8 ML SY: 120 | 24 days supply | Qty: 38 | Fill #0

## 2019-09-28 NOTE — Telephone Encounter (Signed)
I was able to go over his recent lab work and antiphospholipid antibody syndrome diagnosis with him. We will get him off of Xarelto and switched over to Lovenox tomorrow. He preferred the injection to coumadin. He comes in tomorrow for lab work and will also do Lovenox and injection site education with a nurse. All questions answered and we will see him tomorrow.

## 2019-09-29 ENCOUNTER — Other Ambulatory Visit: Payer: Self-pay

## 2019-09-29 ENCOUNTER — Inpatient Hospital Stay: Payer: BC Managed Care – PPO

## 2019-09-29 DIAGNOSIS — I2699 Other pulmonary embolism without acute cor pulmonale: Secondary | ICD-10-CM | POA: Diagnosis not present

## 2019-09-29 DIAGNOSIS — I82409 Acute embolism and thrombosis of unspecified deep veins of unspecified lower extremity: Secondary | ICD-10-CM

## 2019-09-29 NOTE — Progress Notes (Signed)
Lovenox patient teaching performed. Pt self-administered injection to LLA. Denies questions and displays understanding of procedure. Aware to contact our office for questions or concerns. dph

## 2019-10-01 LAB — PROTEIN C, TOTAL: Protein C, Total: 84 % (ref 60–150)

## 2019-10-02 LAB — PROTEIN S ACTIVITY: Protein S Activity: 56 % — ABNORMAL LOW (ref 63–140)

## 2019-10-02 LAB — PROTEIN C ACTIVITY: Protein C Activity: 124 % (ref 73–180)

## 2019-10-02 LAB — PROTEIN S, TOTAL: Protein S Ag, Total: 112 % (ref 60–150)

## 2019-10-03 ENCOUNTER — Other Ambulatory Visit: Payer: Self-pay | Admitting: Physician Assistant

## 2019-10-03 LAB — LUPUS ANTICOAGULANT PANEL
DRVVT: 162 s — ABNORMAL HIGH (ref 0.0–47.0)
PTT Lupus Anticoagulant: 112.1 s — ABNORMAL HIGH (ref 0.0–51.9)

## 2019-10-03 LAB — PTT-LA MIX: PTT-LA Mix: 95.9 s — ABNORMAL HIGH (ref 0.0–48.9)

## 2019-10-03 LAB — HEXAGONAL PHASE PHOSPHOLIPID: Hexagonal Phase Phospholipid: 50 s — ABNORMAL HIGH (ref 0–11)

## 2019-10-03 LAB — DRVVT MIX: dRVVT Mix: 103.4 s — ABNORMAL HIGH (ref 0.0–47.0)

## 2019-10-03 LAB — DRVVT CONFIRM: dRVVT Confirm: 2.6 ratio — ABNORMAL HIGH (ref 0.8–1.2)

## 2019-10-04 ENCOUNTER — Ambulatory Visit: Payer: BC Managed Care – PPO | Admitting: Internal Medicine

## 2019-10-06 ENCOUNTER — Encounter: Payer: Self-pay | Admitting: Internal Medicine

## 2019-10-06 ENCOUNTER — Other Ambulatory Visit: Payer: Self-pay

## 2019-10-06 ENCOUNTER — Ambulatory Visit: Payer: BC Managed Care – PPO | Admitting: Internal Medicine

## 2019-10-06 DIAGNOSIS — R918 Other nonspecific abnormal finding of lung field: Secondary | ICD-10-CM | POA: Diagnosis not present

## 2019-10-06 DIAGNOSIS — I2699 Other pulmonary embolism without acute cor pulmonale: Secondary | ICD-10-CM | POA: Diagnosis not present

## 2019-10-06 DIAGNOSIS — I82401 Acute embolism and thrombosis of unspecified deep veins of right lower extremity: Secondary | ICD-10-CM | POA: Diagnosis not present

## 2019-10-06 NOTE — Assessment & Plan Note (Signed)
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

## 2019-10-06 NOTE — Patient Instructions (Addendum)
No change in medications    Please schedule a follow up visit in 3 months but call sooner if needed with cxr on return

## 2019-10-06 NOTE — Progress Notes (Signed)
Subjective:    Patient ID: Wesley Hancock, male   DOB: 09-23-1971      MRN: [id number]    Brief patient profile:  8 yowm quit smoking regularly (still smokes when drinking a lot) around 2015  And typically  very active works delivering auto parts then around 2016 new onset variable sob so referred to pulmonary clinic 09/08/2017 by Inda Coke   History of Present Illness  09/08/2017 1st [facility] office visit/ Byrd Rushlow   Chief Complaint  Patient presents with  . pulmonary consult    referred by Dr. Morene Rankins for tightness in chest and SOB. pt had a scan done over a year ago with results of lung damage  indolent onset progressively doe x 2 years variably occurring with "almost any activity" but def with steps -   also sob at rest  X sev minutes p relaxing and worse when driving assoc with sense of chest congestion but s excess/ purulent sputum or mucus plugs  And sleeps ok  Severe gerd and sense of pnds during the same period his sob is worse  Never tried inhalers  rec Stop prinizide and start micardis 80-25 one daily in its place and symptoms should gradually resolve  GERD  Diet      Admit date: 08/30/2019 Discharge date: 09/02/2019     Recommendations for Outpatient Follow-up:  1. Follow up with PCP in 1 week 2. Please obtain BMP in one week 3. Continue azithromycin 500 mg p.o. daily for an additional 3 days and cefdinir 300 mg p.o. twice daily for an additional 10 days for pneumonia 4. Please follow up on the following pending results: Sputum culture results, finalization of blood cultures> neg 09/07/2019 f/u  Home Health: No Equipment/Devices: Was on oxygen at home from previous discharge from underlying PE/DVT, titrated off during this hospitalization and no desaturations were noted on ambulation with physical therapy.    History of present illness:  Wesley Hancock is a 48 year old Caucasian gentleman with past medical history remarkable for essential  hypertension, GERD, depression, HLD, and recently diagnosed with PE/DVT on Xarelto, who presented to the [facility] long emergency department with progressive shortness of breath.  Patient was hospitalized 9/22--9/27 for acute bilateral pulmonary emboli, lower extremity DVT, bilateral pneumonia, acute hypoxic respiratory failure and acute kidney injury. He was discharged on Xarelto, Levaquin, steroid taper and 2 L nasal cannula. Acute kidney injury thought multifactorial including contrast injury, ARB, diuretic and Toradol.  Initially felt well when he went home but one day PTA noted increasing shortness of breath and oxygen saturations in the 80s. Breathing status continued to worsen and so he came to the emergency department today for evaluation. He has had some chest pain on the left side from the PE. No specific aggravating or alleviating factors. He has been compliant with Xarelto and antibiotic. No fever at home.  CT showed bilateral pneumonia, bilateral peripheral PE. Treated with ceftriaxone and azithromycin and referred for admission.  Hospital course:  Acute hypoxic respiratory failure Multifocal pneumonia Bilateral pulmonary embolism Patient presenting to ED with progressive shortness of breath. Recently discharged from outside hospital with new diagnosis of PE/DVT and pneumonia. Patient reports compliance with his home Xarelto, and was discharged on 2 L nasal cannula. BNP slightly elevated at 530. WBC count elevated 14.7 on admission. Troponin flat at 23 followed by 21. Recent echocardiogram on 08/24/2019 notable for LVEF 55-65%. CTA PE notable for extensive bilateral airway opacities out of proportion in regards to his pulmonary embolism consistent with  multifocal pneumonia. MRSA PCR negative.  Patient was started on empiric antibiotics with azithromycin and ceftriaxone.  Blood cultures remained negative during the hospitalization.  Sputum culture with rare gram variable  rods.  Patient symptoms progressively improved during hospitalization and he was able to be titrated off of supplemental oxygen without desaturation noted on ambulation with physical therapy at time of discharge.  We will continue antibiotics outpatient with 3 additional days of azithromycin 500 mg p.o. daily in addition to cefdinir 300 mg p.o. twice daily for an additional 10 days.  Continue Xarelto as prescribed previously for PE/DVT.  Recommend follow-up with PCP in 7-10 days following hospitalization.  Bilateral pulmonary embolism Right DVT  Continue Xarelto.  Patient with reported family history of blood clotting in the past, may warrant hypercoagulable work-up once this acute event resolves.  Acute renal failure Most recent creatinine 2.47 on outside hospital discharge on 08/27/2019. Acute kidney injury thought multifactorial including contrast injury, ARB, diuretic and Toradol. Also did have a contrast enhanced CTA on this hospitalization in the ED. creatinine 1.49 at time of discharge.  Recommend repeat BMP in 1 week at PCP visit.  GERD: Continue PPI  Hypokalemia Hypomagnesemia Repleted during hospitalization.  Depression: Continue Lexapro  Diarrhea C. difficile negative on admission.  Continue Imodium prn.  If persists, may warrant outpatient gastroenterology evaluation.  [id number] statin    09/07/2019  Re-establish ov/Amelia Macken re: f/u sp pe  Admit same day  08/30/19 Chief Complaint  Patient presents with  . Hospitalization Follow-up    recent PNA and bilateral PE. Breathing is improving but not back to baseline.   Dyspnea:  Got worse p started xarelto though cp resolved - now 50% baseline  Cough: assoc with hemoptysis / still a little tight with deep breath / never fever no rigors / cough has mostly resolved now   Sleeping: able to lie flat/ two pillows  SABA use: none needed  02: has as needed stopped using it / sats 92% with activity  rec  no change rx     10/06/2019  f/u ov/Ramla Hase re: s/p PE for antiphospholipid ab changed by Heme to  lovenox rx 09/29/19 p hemoptysis developed while  on xarelto 09/26/19 > ER  Chief Complaint  Patient presents with  . Follow-up    Breathing is much imrproved. He did have an isolated episode of hemoptysis.    Dyspnea:  75% improved but has doe x 3 flights not present previously Cough: none, all hemoptysis resolved  Sleeping: flat/ 2 pillows  SABA use: no 02: none  No symptoms from R leg / wearing elastic hose    No obvious day to day or daytime variability or assoc excess/ purulent sputum or mucus plugs or hemoptysis or cp or chest tightness, subjective wheeze or overt sinus or hb symptoms.   Sleeping as above without nocturnal  or early am exacerbation  of respiratory  c/o's or need for noct saba. Also denies any obvious fluctuation of symptoms with weather or environmental changes or other aggravating or alleviating factors except as outlined above   No unusual exposure hx or h/o childhood pna/ asthma or knowledge of premature birth.  Current Allergies, Complete Past Medical History, Past Surgical History, Family History, and Social History were reviewed in [facility] record.  ROS  The following are not active complaints unless bolded Hoarseness, sore throat, dysphagia, dental problems, itching, sneezing,  nasal congestion or discharge of excess mucus or purulent secretions, ear ache,   fever, chills,  sweats, unintended wt loss or wt gain, classically pleuritic or exertional cp,  orthopnea pnd or arm/hand swelling  or leg swelling better on R, presyncope, palpitations, abdominal pain, anorexia, nausea, vomiting, diarrhea  or change in bowel habits or change in bladder habits, change in stools or change in urine, dysuria, hematuria,  rash, arthralgias, visual complaints, headache, numbness, weakness or ataxia or problems with walking or coordination,  change in mood or  memory.         Current Meds  Medication Sig  . acetaminophen (TYLENOL) 500 MG tablet Take 1,000 mg by mouth every 6 (six) hours as needed for mild pain.  Marland Kitchen atorvastatin (LIPITOR) 10 MG tablet Take 1 tablet (10 mg total) by mouth daily.  Marland Kitchen enoxaparin (LOVENOX) 120 MG/0.8ML injection Inject 0.8 mLs (120 mg total) into the skin every 12 (twelve) hours.  Marland Kitchen escitalopram (LEXAPRO) 10 MG tablet Take 1 tablet (10 mg total) by mouth daily. (Patient taking differently: Take 20 mg by mouth daily. )  . fluticasone (FLONASE) 50 MCG/ACT nasal spray Place 1 spray into both nostrils daily.   . nitroGLYCERIN (NITROSTAT) 0.4 MG SL tablet Place 1 tablet (0.4 mg total) under the tongue every 5 (five) minutes as needed for chest pain.  Marland Kitchen omeprazole (PRILOSEC) 20 MG capsule Take 20 mg by mouth 2 (two) times daily before a meal.  .     . telmisartan-hydrochlorothiazide (MICARDIS HCT) 40-12.5 MG tablet TAKE 1 TABLET BY MOUTH EVERY DAY                    Objective:   Physical Exam    amb wm nad   10/06/2019       279 09/07/2019       275   09/08/17 254 lb 2 oz (115.3 kg)  08/19/17 253 lb (114.8 kg)  08/04/17 254 lb (115.2 kg)    BP (!) 144/90 (BP Location: Left Arm, Cuff Size: Normal)   Pulse 78   Temp 98.3 F (36.8 C) (Temporal)   Ht 6\' 1"  (1.854 m)   Wt 279 lb 12.8 oz (126.9 kg)   SpO2 97% Comment: on RA  BMI 36.92 kg/m     HEENT : pt wearing mask not removed for exam due to covid -19 concerns.    NECK :  without JVD/Nodes/TM/ nl carotid upstrokes bilaterally   LUNGS: no acc muscle use,  Nl contour chest which is clear to A and P bilaterally without cough on insp or exp maneuvers   CV:  RRR  no s3 or murmur or increase in P2, and LE's with elastic hose, no pitting.  ABD:  soft and nontender with nl inspiratory excursion in the supine position. No bruits or organomegaly appreciated, bowel sounds nl  MS:  Nl gait/ ext warm without deformities, calf tenderness, cyanosis or clubbing No obvious  joint restrictions   SKIN: warm and dry without lesions    NEURO:  alert, approp, nl sensorium with  no motor or cerebellar deficits apparent.      I personally reviewed images and agree with radiology impression as follows:  CXR:    09/26/2019 Partial clearing of airspace disease seen on prior. Resolved left pleural effusion.         Assessment:

## 2019-10-06 NOTE — Assessment & Plan Note (Addendum)
Dx 08/23/2019 (care everywhere)  Main risk factors are obesity and antiphospholip syndome  Plans f/u with repeat venous dopplers which makes sense as this is the likely source of past and future recurrent clots but much less likely on lovenox for life  - needs to work on the wt and plans to continue to do so  > 50 % of today's ov spent face to face counseling    Each maintenance medication was reviewed in detail including most importantly the difference between maintenance and as needed and under what circumstances the prns are to be used.  Please see AVS for specific  Instructions which are unique to this visit and I personally typed out  which were reviewed in detail in writing with the patient and a copy provided.

## 2019-10-06 NOTE — Assessment & Plan Note (Signed)
In setting of bilateral PE 08/23/2019 with antiphospholipid syndrome   This association is the one assoc with acute lung injury in addition to all the other pulmonary manifestation of pe which is what we're likely seeing on cxr and why still doe with nl Echo (s Victor) at this point but clearly responding to rx by hematology.  Needs one more cxr in 3 months for baseline but nothing else in meantime as long as continuing to improve ex tol and no new cp/ hemoptysis.

## 2019-10-19 ENCOUNTER — Encounter: Payer: Self-pay | Admitting: Family

## 2019-10-19 ENCOUNTER — Telehealth: Payer: Self-pay | Admitting: Family

## 2019-10-19 MED FILL — ENOXAPARIN SODIUM 120 MG/0.: 120 | 30 days supply | Qty: 48 | Fill #0

## 2019-10-19 NOTE — Telephone Encounter (Signed)
Called to address patient questions sent through mychart. Left call back number on voicemail.

## 2019-10-23 ENCOUNTER — Encounter: Payer: Self-pay | Admitting: Physician Assistant

## 2019-10-23 NOTE — Telephone Encounter (Signed)
Please call pt and schedule appt

## 2019-10-24 ENCOUNTER — Other Ambulatory Visit: Payer: Self-pay | Admitting: Family

## 2019-10-24 ENCOUNTER — Telehealth: Payer: Self-pay | Admitting: Family

## 2019-10-24 NOTE — Telephone Encounter (Signed)
I was able to speak with [name] and addressed all his questions about work and [employer]. No other questions or concerns at this time.

## 2019-10-30 ENCOUNTER — Other Ambulatory Visit: Payer: Self-pay

## 2019-10-30 ENCOUNTER — Ambulatory Visit (INDEPENDENT_AMBULATORY_CARE_PROVIDER_SITE_OTHER): Payer: BC Managed Care – PPO | Admitting: Physician Assistant

## 2019-10-30 ENCOUNTER — Encounter: Payer: Self-pay | Admitting: Physician Assistant

## 2019-10-30 VITALS — BP 140/94 | HR 92 | Temp 98.1°F | Ht 73.0 in | Wt 290.4 lb

## 2019-10-30 DIAGNOSIS — R200 Anesthesia of skin: Secondary | ICD-10-CM | POA: Diagnosis not present

## 2019-10-30 DIAGNOSIS — I10 Essential (primary) hypertension: Secondary | ICD-10-CM | POA: Diagnosis not present

## 2019-10-30 DIAGNOSIS — F320 Major depressive disorder, single episode, mild: Secondary | ICD-10-CM | POA: Diagnosis not present

## 2019-10-30 DIAGNOSIS — D6861 Antiphospholipid syndrome: Secondary | ICD-10-CM

## 2019-10-30 HISTORY — DX: Antiphospholipid syndrome: D68.61

## 2019-10-30 MED ORDER — ESCITALOPRAM OXALATE 20 MG PO TABS: 20.0000 mg | ORAL_TABLET | Freq: Every day | ORAL | 2 refills | Status: DC

## 2019-10-30 MED ORDER — TELMISARTAN-HCTZ 80-25 MG PO TABS: 1.0000 | ORAL_TABLET | Freq: Every day | ORAL | 2 refills | Status: DC

## 2019-10-30 NOTE — Progress Notes (Signed)
Wesley Hancock is a 48 y.o. male here for a new problem.  I acted as a Education administrator for [employer], PA-C Anselmo Pickler, LPN  History of Present Illness:   Chief Complaint  Patient presents with  . Rt foot cold    HPI   Rt foot numbness Pt c/o right foot cold x 6 months ago but went away. He endorses recent return of symptoms for the past 2 months. In Sep 2020 was found to have DVT in R leg (see imaging results in Care Everywhere) and also had a PE. Since that time he has been religiously wearing b/l compression stockings.   Symptoms are mostly in R great toe, but also having some possible decreased sensation to 2nd & 3rd toes. Denies known trauma. Recently diagnosed with antiphospholipid syndrome.  HTN Currently taking Micardis HCT 80-25 mg. At home blood pressure readings are: well controlled. Patient denies chest pain, SOB, blurred vision, dizziness, unusual headaches, lower leg swelling. Patient is compliant with medication. Denies excessive caffeine intake, stimulant usage, excessive alcohol intake, or increase in salt consumption.  Depression Currently well controlled with Lexapro 20 mg daily. Tolerating well without significant issues.  Denies SI/HI. Feels like his mood is overall well controlled.  Past Medical History:  Diagnosis Date  . Depression   . DVT (deep venous thrombosis) ([place])   . High cholesterol   . Hyperlipidemia   . Nutcracker esophagus      Social History   Socioeconomic History  . Marital status: Married    Spouse name: Not on file  . Number of children: Not on file  . Years of education: Not on file  . Highest education level: Not on file  Occupational History  . Not on file  Social Needs  . Financial resource strain: Not on file  . Food insecurity    Worry: Not on file    Inability: Not on file  . Transportation needs    Medical: Not on file    Non-medical: Not on file  Tobacco Use  . Smoking status: Former Smoker    Packs/day: 1.00     Years: 20.00    Pack years: 20.00    Types: Cigarettes    Quit date: 11/30/2013    Years since quitting: 5.9  . Smokeless tobacco: Never Used  Substance and Sexual Activity  . Alcohol use: Yes    Alcohol/week: 5.0 standard drinks    Types: 5 Cans of beer per week  . Drug use: No  . Sexual activity: Yes  Lifestyle  . Physical activity    Days per week: Not on file    Minutes per session: Not on file  . Stress: Not on file  Relationships  . Social Herbalist on phone: Not on file    Gets together: Not on file    Attends religious service: Not on file    Active member of club or organization: Not on file    Attends meetings of clubs or organizations: Not on file    Relationship status: Not on file  . Intimate partner violence    Fear of current or ex partner: Not on file    Emotionally abused: Not on file    Physically abused: Not on file    Forced sexual activity: Not on file  Other Topics Concern  . Not on file  Social History Narrative   Married (03/2016)   Lives in [place], [place] (Originally from [place] )   Some family  in [place] live in [place].   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   Did play minor league baseball. ([place])   Likes to hunt, fish and plays in competitive soft ball league (slow pitch)   Interest includes trips to [employer] and collecting colorful socks.    Past Surgical History:  Procedure Laterality Date  . CHOLECYSTECTOMY      Family History  Problem Relation Age of Onset  . Diabetes Mother   . Hyperlipidemia Mother   . Hypertension Mother   . Heart disease Father 10  . Hyperlipidemia Father   . Hypertension Father   . Prostate cancer Father 56  . Lung cancer Father 56       Dx 06/18/2017  . Diabetes Sister   . Hyperlipidemia Sister   . Hypertension Sister   . Diabetes Maternal Grandmother   . Heart disease Maternal Grandmother   . Hyperlipidemia Maternal Grandmother    . Hypertension Maternal Grandmother   . Colon cancer Maternal Grandmother   . Heart disease Maternal Grandfather   . Hyperlipidemia Maternal Grandfather   . Hypertension Maternal Grandfather   . Stroke Maternal Grandfather   . Liver cancer Maternal Grandfather   . Heart disease Paternal Grandmother     Allergies  Allergen Reactions  . Diltiazem Hcl Diarrhea    Lethargic as well    Current Medications:   Current Outpatient Medications:  .  acetaminophen (TYLENOL) 500 MG tablet, Take 1,000 mg by mouth every 6 (six) hours as needed for mild pain., Disp: , Rfl:  .  atorvastatin (LIPITOR) 10 MG tablet, Take 1 tablet (10 mg total) by mouth daily., Disp: 30 tablet, Rfl: 5 .  escitalopram (LEXAPRO) 20 MG tablet, Take 1 tablet (20 mg total) by mouth daily., Disp: 90 tablet, Rfl: 2 .  fluticasone (FLONASE) 50 MCG/ACT nasal spray, Place 1 spray into both nostrils daily. , Disp: , Rfl:  .  nitroGLYCERIN (NITROSTAT) 0.4 MG SL tablet, Place 1 tablet (0.4 mg total) under the tongue every 5 (five) minutes as needed for chest pain., Disp: 15 tablet, Rfl: 2 .  omeprazole (PRILOSEC) 20 MG capsule, Take 20 mg by mouth 2 (two) times daily before a meal., Disp: , Rfl:  .  telmisartan-hydrochlorothiazide (MICARDIS HCT) 80-25 MG tablet, Take 1 tablet by mouth daily., Disp: 90 tablet, Rfl: 2 .  enoxaparin (LOVENOX) 120 MG/0.8ML injection, Inject 0.8 mLs (120 mg total) into the skin every 12 (twelve) hours., Disp: 48 mL, Rfl: 11   Review of Systems:   ROS Negative unless otherwise specified per HPI.  Vitals:   Vitals:   10/30/19 1009  BP: (!) 140/94  Pulse: 92  Temp: 98.1 F (36.7 C)  TempSrc: Temporal  SpO2: 96%  Weight: 290 lb 6.1 oz (131.7 kg)  Height: 6\' 1"  (1.854 m)     Body mass index is 38.31 kg/m.  Physical Exam:   Physical Exam Vitals signs and nursing note reviewed.  Constitutional:      General: He is not in acute distress.    Appearance: He is well-developed. He is not  ill-appearing or toxic-appearing.  Cardiovascular:     Rate and Rhythm: Normal rate and regular rhythm.     Pulses: Normal pulses.          Dorsalis pedis pulses are 2+ on the right side and 2+ on the left side.       Posterior tibial pulses are  2+ on the right side and 2+ on the left side.     Heart sounds: Normal heart sounds, S1 normal and S2 normal.     Comments: Capillary refill < 2 seconds Pulmonary:     Effort: Pulmonary effort is normal.     Breath sounds: Normal breath sounds.  Feet:     Comments: Decreased perceived sensation to R great toe >> R second and third toes; normal ROM of toes Skin:    General: Skin is warm and dry.  Neurological:     Mental Status: He is alert.     GCS: GCS eye subscore is 4. GCS verbal subscore is 5. GCS motor subscore is 6.  Psychiatric:        Speech: Speech normal.        Behavior: Behavior normal. Behavior is cooperative.      Assessment and Plan:   Rashann was seen today for rt foot cold.  Diagnoses and all orders for this visit:  Numbness of right foot Unclear etiology. Briefly discussed with Dr. [name], will refer to him for further evaluation and treatment. No red flags on my exam, adequate pulses and capillary refill.  -     Ambulatory referral to Sports Medicine  Essential hypertension Well-controlled. Continue current regimen of Micardis HCT 80-25 mg. Follow-up in 6 months, sooner if concerns.  Depression, major, single episode, mild ([place]) Well-controlled. Continue Lexapro 20 mg daily. I discussed with patient that if they develop any SI, to tell someone immediately and seek medical attention. Follow-up in 6 months, sooner if concerns.  Other orders -     escitalopram (LEXAPRO) 20 MG tablet; Take 1 tablet (20 mg total) by mouth daily. -     telmisartan-hydrochlorothiazide (MICARDIS HCT) 80-25 MG tablet; Take 1 tablet by mouth daily.  . Reviewed expectations re: course of current medical issues. . Discussed  self-management of symptoms. . Outlined signs and symptoms indicating need for more acute intervention. . Patient verbalized understanding and all questions were answered. . See orders for this visit as documented in the electronic medical record. . Patient received an After-Visit Summary.  CMA or LPN served as scribe during this visit. History, Physical, and Plan performed by medical provider. The above documentation has been reviewed and is accurate and complete.   Inda Coke, PA-C

## 2019-10-30 NOTE — Patient Instructions (Addendum)
It was great to see you!  Please schedule an appointment with Dr. [name] on your way out. He is our sports medicine provider and can help [place] figure out what's going on with your toes.   Please call [facility] to see if they have your referral: [phone] -- let me know if they do not!  Take care,  Inda Coke PA-C

## 2019-11-08 ENCOUNTER — Other Ambulatory Visit: Payer: Self-pay

## 2019-11-08 ENCOUNTER — Ambulatory Visit: Payer: Self-pay

## 2019-11-08 ENCOUNTER — Encounter: Payer: Self-pay | Admitting: Family Medicine

## 2019-11-08 ENCOUNTER — Ambulatory Visit (INDEPENDENT_AMBULATORY_CARE_PROVIDER_SITE_OTHER): Payer: BC Managed Care – PPO | Admitting: Family Medicine

## 2019-11-08 VITALS — BP 132/86 | HR 112 | Ht 73.0 in | Wt 286.6 lb

## 2019-11-08 DIAGNOSIS — M79674 Pain in right toe(s): Secondary | ICD-10-CM | POA: Diagnosis not present

## 2019-11-08 DIAGNOSIS — R202 Paresthesia of skin: Secondary | ICD-10-CM | POA: Insufficient documentation

## 2019-11-08 HISTORY — DX: Paresthesia of skin: R20.2

## 2019-11-08 NOTE — Progress Notes (Signed)
Subjective:    I'm seeing this patient as a consultation for:  Len Blalock, PA  CC: R foot numbness  I, [name], LAT, ATC, am serving as scribe for Dr. [name].  HPI: Pt is a pleasant 47 y/o male presenting w/ R foot numbness, particularly R toes 1-3 and coldness in his R foot x 3 weeks.  Pt's history is significant for a R leg DVT and PE in September 2020 and pt has been wearing B compression stockings since then.  Pt has also been recently diagnosed w/ antiphospholipid syndrome.  Today, pt reports only numbness in his R great toe and a sensation of coldness in his R 1st 3 toes.  He denies any weakness or significant pain.  He notes the numbness is associated with a slight tingling sensation.  He is more worried about what the numbness might represent than bothered by the actual sensation.  He notes is not particularly painful.  He wants to make sure that it does not represent a thing dangerous or an issue with compromised blood flow.  He works as a Production designer, theatre/television/film.  Past medical history, Surgical history, Family history not pertinant except as noted below, Social history, Allergies, and medications have been entered into the medical record, reviewed, and no changes needed.   Review of Systems: No headache, visual changes, nausea, vomiting, diarrhea, constipation, dizziness, abdominal pain, skin rash, fevers, chills, night sweats, weight loss, swollen lymph nodes, body aches, joint swelling, muscle aches, chest pain, shortness of breath, mood changes, visual or auditory hallucinations.   Objective:    Vitals:   11/08/19 0812  BP: 132/86  Pulse: (!) 112  SpO2: 96%   General: Well Developed, well nourished, and in no acute distress.  Neuro/Psych: Alert and oriented x3, extra-ocular muscles intact, able to move all 4 extremities, sensation grossly intact. Skin: Warm and dry, no rashes noted.  Respiratory: Not using accessory muscles, speaking in full sentences, trachea  midline.  Cardiovascular: Pulses palpable, no extremity edema.  Heart rate per my check 90 bpm. Abdomen: Does not appear distended. MSK:  Right knee ankle and foot normal-appearing with no swelling.  No deformity. Knee normal motion nontender. Lower leg normal-appearing nontender. Foot and ankle normal-appearing normal motion.  Nontender.  Intact pulses at dorsal pedis and posterior tibialis.  Excellent capillary refil present in toes. Sensation intact to light touch. Lower extremity strength is intact throughout with excellent foot dorsiflexion and plantarflexion and great toe dorsiflexion plantarflexion.  Lab and Radiology Results Limited musculoskeletal ultrasound of right anterior lateral knee specifically targeting peroneal nerve. Common peroneal nerve visualized and tract to the superficial peroneal nerve more anterior to knee.  Compression of this area exacerbates his paresthesia in his foot.  Nerve is normal-appearing with no obvious deformity. Normal bony structures present.  Normal muscular structures present.  Impression and Recommendations:    Assessment and Plan: 49 y.o. male with right foot paresthesia.  Based on distribution of symptoms superficial peroneal nerve irritation or compression is the most likely cause.  Differential also includes peripheral neuropathy.  Arterial blood flow issue very unlikely given good pulses and good capillary refill. Patient is using compression stockings which terminate near the lateral knee which very likely are compressing the superficial peroneal nerve.   We discussed options.  Further work-up for this issue would include nerve conduction study/EMG which should help further characterize the location of nerve irritation.  Additionally could pursue ABI to further rule out arterial cause.  However  after discussion we will proceed with modifying how he is using his compression stockings to reduce compression on this area.  If not better or if  worse could pursue further work-up as above.    Additionally discussed possibility of medicine such as gabapentin for symptom management.  However he notes that his symptoms are not that bothersome and he like to avoid medication if possible.    Recheck back with me as needed.  Patient expresses understanding and agreement.   Orders Placed This Encounter  Procedures  . NO CHG - [place] LOWER RIGHT    Order Specific Question:   Reason for Exam (SYMPTOM  OR DIAGNOSIS REQUIRED)    Answer:   R toe numbness    Order Specific Question:   Preferred imaging location?    Answer:   [facility]   No orders of the defined types were placed in this encounter.   Discussed warning signs or symptoms. Please see discharge instructions. Patient expresses understanding.  The above documentation has been reviewed and is accurate and complete [name]

## 2019-11-08 NOTE — Patient Instructions (Signed)
Thank you for coming in today. I think the problem is likely compression of the superficial peroneal nerve the anteriolateral knee due to the compression stocking.  Work on changing the compression on your leg.   Next step to evaluate the nerve would be a nerve conduction study with EMG.  Next step to evaluate the blood flow is an ABI  (arterial branchial index).  Keep me updated.   We're moving!  Dr. Clovis Riley new office will be located at [address] on the 1st floor.  This location is across the street from the [employer] and in the same complex as the [facility] and [employer]  Our new office phone number will be [phone].  We anticipate beginning to see patients at the [facility] office in early December 2020.

## 2019-11-21 ENCOUNTER — Encounter: Payer: Self-pay | Admitting: Family

## 2019-11-21 ENCOUNTER — Other Ambulatory Visit: Payer: Self-pay

## 2019-11-21 DIAGNOSIS — I2699 Other pulmonary embolism without acute cor pulmonale: Secondary | ICD-10-CM

## 2019-11-21 DIAGNOSIS — I82409 Acute embolism and thrombosis of unspecified deep veins of unspecified lower extremity: Secondary | ICD-10-CM

## 2019-11-21 MED ORDER — ENOXAPARIN SODIUM 120 MG/0.8ML ~~LOC~~ SOLN: 120.0000 mg | Freq: Two times a day (BID) | SUBCUTANEOUS | 0 refills | Status: DC

## 2019-12-19 ENCOUNTER — Other Ambulatory Visit: Payer: Self-pay | Admitting: Family

## 2019-12-19 DIAGNOSIS — I82409 Acute embolism and thrombosis of unspecified deep veins of unspecified lower extremity: Secondary | ICD-10-CM

## 2019-12-19 DIAGNOSIS — D6861 Antiphospholipid syndrome: Secondary | ICD-10-CM

## 2019-12-19 DIAGNOSIS — D6859 Other primary thrombophilia: Secondary | ICD-10-CM

## 2019-12-20 ENCOUNTER — Telehealth: Payer: Self-pay | Admitting: Family

## 2019-12-20 ENCOUNTER — Inpatient Hospital Stay (HOSPITAL_BASED_OUTPATIENT_CLINIC_OR_DEPARTMENT_OTHER): Payer: BC Managed Care – PPO | Admitting: Family

## 2019-12-20 ENCOUNTER — Other Ambulatory Visit: Payer: Self-pay

## 2019-12-20 ENCOUNTER — Inpatient Hospital Stay: Payer: BC Managed Care – PPO | Attending: Family

## 2019-12-20 ENCOUNTER — Encounter: Payer: Self-pay | Admitting: Family

## 2019-12-20 VITALS — BP 146/95 | HR 102 | Temp 97.1°F | Resp 18 | Ht 73.0 in | Wt 284.8 lb

## 2019-12-20 DIAGNOSIS — I82409 Acute embolism and thrombosis of unspecified deep veins of unspecified lower extremity: Secondary | ICD-10-CM

## 2019-12-20 DIAGNOSIS — D6859 Other primary thrombophilia: Secondary | ICD-10-CM

## 2019-12-20 DIAGNOSIS — D6861 Antiphospholipid syndrome: Secondary | ICD-10-CM | POA: Diagnosis not present

## 2019-12-20 DIAGNOSIS — I2699 Other pulmonary embolism without acute cor pulmonale: Secondary | ICD-10-CM | POA: Insufficient documentation

## 2019-12-20 LAB — CBC WITH DIFFERENTIAL (CANCER CENTER ONLY)
Abs Immature Granulocytes: 0.03 10*3/uL (ref 0.00–0.07)
Basophils Absolute: 0 10*3/uL (ref 0.0–0.1)
Basophils Relative: 1 %
Eosinophils Absolute: 0.2 10*3/uL (ref 0.0–0.5)
Eosinophils Relative: 2 %
HCT: 44.5 % (ref 39.0–52.0)
Hemoglobin: 15 g/dL (ref 13.0–17.0)
Immature Granulocytes: 1 %
Lymphocytes Relative: 17 %
Lymphs Abs: 1.1 10*3/uL (ref 0.7–4.0)
MCH: 29.8 pg (ref 26.0–34.0)
MCHC: 33.7 g/dL (ref 30.0–36.0)
MCV: 88.3 fL (ref 80.0–100.0)
Monocytes Absolute: 0.4 10*3/uL (ref 0.1–1.0)
Monocytes Relative: 6 %
Neutro Abs: 4.8 10*3/uL (ref 1.7–7.7)
Neutrophils Relative %: 73 %
Platelet Count: 200 10*3/uL (ref 150–400)
RBC: 5.04 MIL/uL (ref 4.22–5.81)
RDW: 12.5 % (ref 11.5–15.5)
WBC Count: 6.6 10*3/uL (ref 4.0–10.5)
nRBC: 0 % (ref 0.0–0.2)

## 2019-12-20 LAB — CMP (CANCER CENTER ONLY)
ALT: 47 U/L — ABNORMAL HIGH (ref 0–44)
AST: 20 U/L (ref 15–41)
Albumin: 4.2 g/dL (ref 3.5–5.0)
Alkaline Phosphatase: 47 U/L (ref 38–126)
Anion gap: 7 (ref 5–15)
BUN: 19 mg/dL (ref 6–20)
CO2: 26 mmol/L (ref 22–32)
Calcium: 9.6 mg/dL (ref 8.9–10.3)
Chloride: 105 mmol/L (ref 98–111)
Creatinine: 1.1 mg/dL (ref 0.61–1.24)
GFR, Est AFR Am: >60 mL/min (ref >60)
GFR, Estimated: >60 mL/min (ref >60)
Glucose, Bld: 125 mg/dL — ABNORMAL HIGH (ref 70–99)
Potassium: 4 mmol/L (ref 3.5–5.1)
Sodium: 138 mmol/L (ref 135–145)
Total Bilirubin: 0.8 mg/dL (ref 0.3–1.2)
Total Protein: 7.8 g/dL (ref 6.5–8.1)

## 2019-12-20 LAB — FERRITIN: Ferritin: 135 ng/mL (ref 24–336)

## 2019-12-20 LAB — IRON AND TIBC
Iron: 45 ug/dL (ref 42–163)
Saturation Ratios: 12 % — ABNORMAL LOW (ref 20–55)
TIBC: 390 ug/dL (ref 202–409)
UIBC: 345 ug/dL (ref 117–376)

## 2019-12-20 NOTE — Progress Notes (Signed)
Hematology and Oncology Follow Up Visit  Wesley Hancock CC:[id number] 12-Mar-1971 49 y.o. 12/20/2019   Principle Diagnosis:  Antiphospholipid antibody syndrome  Bilateral pulmonary emboli with no heart strain and right lower extremity DVT  Current Therapy:   Lovenox 120 mg SQ BID   Interim History:  Wesley Hancock is here today for follow-up and continues to do quite well.  He is tolerating his Lovenox injections well and denies any bleeding, bruising or petechiae. This has been on hold since Sunday and he will be having a vasectomy later this afternoon. He will restart his Lovenox in 2 days per his surgeon.  He still has some mild SOB with the cold weather and over exertion. He sees his pulmonologist again on 2/4 and states that they plan to get him into pulmonary rehab.  No fever, chills, n/v, cough, rash, dizziness, chest pain, palpitations, abdominal pain or changes in bowel or bladder habits.  No swelling, tenderness, numbness or tingling in his extremities at this time.  He wears compression stockings daily for added support.  No falls or syncope.  He is eating a healthier diet and states that he has lost 10 lbs over the last several weeks.   ECOG Performance Status: 1 - Symptomatic but completely ambulatory  Medications:  Allergies as of 12/20/2019      Reactions   Diltiazem Hcl Diarrhea   Lethargic as well      Medication List       Accurate as of December 20, 2019  9:03 AM. If you have any questions, ask your nurse or doctor.        acetaminophen 500 MG tablet Commonly known as: TYLENOL Take 1,000 mg by mouth every 6 (six) hours as needed for mild pain.   atorvastatin 10 MG tablet Commonly known as: LIPITOR Take 1 tablet (10 mg total) by mouth daily.   enoxaparin 120 MG/0.8ML injection Commonly known as: Lovenox Inject 0.8 mLs (120 mg total) into the skin every 12 (twelve) hours.   escitalopram 20 MG tablet Commonly known as: LEXAPRO Take 1 tablet (20 mg  total) by mouth daily.   fluticasone 50 MCG/ACT nasal spray Commonly known as: FLONASE Place 1 spray into both nostrils daily.   nitroGLYCERIN 0.4 MG SL tablet Commonly known as: NITROSTAT Place 1 tablet (0.4 mg total) under the tongue every 5 (five) minutes as needed for chest pain.   omeprazole 20 MG capsule Commonly known as: PRILOSEC Take 20 mg by mouth 2 (two) times daily before a meal.   telmisartan-hydrochlorothiazide 80-25 MG tablet Commonly known as: MICARDIS HCT Take 1 tablet by mouth daily.       Allergies:  Allergies  Allergen Reactions  . Diltiazem Hcl Diarrhea    Lethargic as well    Past Medical History, Surgical history, Social history, and Family History were reviewed and updated.  Review of Systems: All other 10 point review of systems is negative.   Physical Exam:  vitals were not taken for this visit.   Wt Readings from Last 3 Encounters:  11/08/19 286 lb 9.6 oz (130 kg)  10/30/19 290 lb 6.1 oz (131.7 kg)  10/06/19 279 lb 12.8 oz (126.9 kg)    Ocular: Sclerae unicteric, pupils equal, round and reactive to light Ear-nose-throat: Oropharynx clear, dentition fair Lymphatic: No cervical or supraclavicular adenopathy Lungs no rales or rhonchi, good excursion bilaterally Heart regular rate and rhythm, no murmur appreciated Abd soft, nontender, positive bowel sounds, no liver or spleen tip palpated on exam, no  fluid wave  MSK no focal spinal tenderness, no joint edema Neuro: non-focal, well-oriented, appropriate affect Breasts: Deferred   Lab Results  Component Value Date   WBC 6.6 12/20/2019   HGB 15.0 12/20/2019   HCT 44.5 12/20/2019   MCV 88.3 12/20/2019   PLT 200 12/20/2019   Lab Results  Component Value Date   FERRITIN 310 09/19/2019   IRON 43 09/19/2019   TIBC 361 09/19/2019   UIBC 318 09/19/2019   IRONPCTSAT 12 (L) 09/19/2019   Lab Results  Component Value Date   RETICCTPCT 1.8 09/19/2019   RBC 5.04 12/20/2019   No results  found for: KPAFRELGTCHN, LAMBDASER, KAPLAMBRATIO No results found for: IGGSERUM, IGA, IGMSERUM No results found for: Odetta Pink, SPEI   Chemistry      Component Value Date/Time   NA 136 09/19/2019 0759   K 3.3 (L) 09/19/2019 0759   CL 99 09/19/2019 0759   CO2 28 09/19/2019 0759   BUN 20 09/19/2019 0759   CREATININE 1.16 09/19/2019 0759      Component Value Date/Time   CALCIUM 9.5 09/19/2019 0759   ALKPHOS 63 09/19/2019 0759   AST 18 09/19/2019 0759   ALT 28 09/19/2019 0759   BILITOT 0.7 09/19/2019 0759       Impression and Plan: Wesley Hancock is a very pleasant 49 yo caucasian gentleman with antiphospholipid antibody syndrome and mild protein S activity deficiency. He has history of bilateral pulmonary emboli with no heart strain and right lower extremity DVT.  He is doing well on Lovenox BID and so far there has been no evidence of recurrence.  We will plan to see him back in another 4 months.  He will continue his same regimen with Lovenox and contact our office with any questions or concerns. We can certainly see him sooner if needed.   [name], NP 1/20/20219:03 AM

## 2019-12-20 NOTE — Telephone Encounter (Signed)
Appointments scheduled patient will get updates from My Chart per his request per 1/20 los

## 2019-12-22 LAB — CARDIOLIPIN ANTIBODIES, IGG, IGM, IGA
Anticardiolipin IgA: 15 APL U/mL — ABNORMAL HIGH (ref 0–11)
Anticardiolipin IgG: 85 GPL U/mL — ABNORMAL HIGH (ref 0–14)
Anticardiolipin IgM: 13 MPL U/mL — ABNORMAL HIGH (ref 0–12)

## 2019-12-25 LAB — PROTEIN S, TOTAL: Protein S Ag, Total: 87 % (ref 60–150)

## 2019-12-25 LAB — PROTEIN S ACTIVITY: Protein S Activity: 50 % — ABNORMAL LOW (ref 63–140)

## 2019-12-26 LAB — LUPUS ANTICOAGULANT PANEL
DRVVT: 77.2 s — ABNORMAL HIGH (ref 0.0–47.0)
PTT Lupus Anticoagulant: 75.8 s — ABNORMAL HIGH (ref 0.0–51.9)

## 2019-12-26 LAB — DRVVT CONFIRM: dRVVT Confirm: 2 ratio — ABNORMAL HIGH (ref 0.8–1.2)

## 2019-12-26 LAB — DRVVT MIX: dRVVT Mix: 67.4 s — ABNORMAL HIGH (ref 0.0–40.4)

## 2019-12-26 LAB — HEXAGONAL PHASE PHOSPHOLIPID: Hexagonal Phase Phospholipid: 24 s — ABNORMAL HIGH (ref 0–11)

## 2019-12-26 LAB — PTT-LA MIX: PTT-LA Mix: 66.9 s — ABNORMAL HIGH (ref 0.0–48.9)

## 2020-01-04 ENCOUNTER — Ambulatory Visit: Payer: BC Managed Care – PPO | Admitting: Internal Medicine

## 2020-01-04 ENCOUNTER — Encounter: Payer: Self-pay | Admitting: Internal Medicine

## 2020-01-04 ENCOUNTER — Other Ambulatory Visit: Payer: Self-pay

## 2020-01-04 ENCOUNTER — Ambulatory Visit (INDEPENDENT_AMBULATORY_CARE_PROVIDER_SITE_OTHER): Payer: BC Managed Care – PPO

## 2020-01-04 DIAGNOSIS — R918 Other nonspecific abnormal finding of lung field: Secondary | ICD-10-CM

## 2020-01-04 DIAGNOSIS — R0600 Dyspnea, unspecified: Secondary | ICD-10-CM | POA: Diagnosis not present

## 2020-01-04 DIAGNOSIS — R0609 Other forms of dyspnea: Secondary | ICD-10-CM

## 2020-01-04 DIAGNOSIS — R05 Cough: Secondary | ICD-10-CM | POA: Diagnosis not present

## 2020-01-04 DIAGNOSIS — R058 Other specified cough: Secondary | ICD-10-CM

## 2020-01-04 MED ORDER — PANTOPRAZOLE SODIUM 40 MG PO TBEC: DELAYED_RELEASE_TABLET | ORAL | 2 refills | Status: DC

## 2020-01-04 NOTE — Progress Notes (Signed)
LMTCB

## 2020-01-04 NOTE — Progress Notes (Signed)
Subjective:    Patient ID: Wesley Hancock, male   DOB: Oct 09, 1971      MRN: [id number]    Brief patient profile:  21 yowm quit smoking regularly (still smokes when drinking a lot) around 2015  And typically  very active works delivering auto parts then around 2016 new onset variable sob so referred to pulmonary clinic 09/08/2017 by Inda Coke   History of Present Illness  09/08/2017 1st [facility] office visit/ Levon Penning   Chief Complaint  Patient presents with  . pulmonary consult    referred by Dr. Morene Rankins for tightness in chest and SOB. pt had a scan done over a year ago with results of lung damage  indolent onset progressively doe x 2 years variably occurring with "almost any activity" but def with steps -   also sob at rest  X sev minutes p relaxing and worse when driving assoc with sense of chest congestion but s excess/ purulent sputum or mucus plugs  And sleeps ok  Severe gerd and sense of pnds during the same period his sob is worse  Never tried inhalers  rec Stop prinizide and start micardis 80-25 one daily in its place and symptoms should gradually resolve  GERD  Diet      Admit date: 08/30/2019 Discharge date: 09/02/2019   Recommendations for Outpatient Follow-up:  1. Follow up with PCP in 1 week 2. Please obtain BMP in one week 3. Continue azithromycin 500 mg p.o. daily for an additional 3 days and cefdinir 300 mg p.o. twice daily for an additional 10 days for pneumonia 4. Please follow up on the following pending results: Sputum culture results, finalization of blood cultures> neg 09/07/2019 f/u  Home Health: No Equipment/Devices: Was on oxygen at home from previous discharge from underlying PE/DVT, titrated off during this hospitalization and no desaturations were noted on ambulation with physical therapy.    History of present illness:  Wesley Hancock is a 49 year old Caucasian gentleman with past medical history remarkable for essential  hypertension, GERD, depression, HLD, and recently diagnosed with PE/DVT on Xarelto, who presented to the [facility] long emergency department with progressive shortness of breath.  Patient was hospitalized 9/22--9/27 for acute bilateral pulmonary emboli, lower extremity DVT, bilateral pneumonia, acute hypoxic respiratory failure and acute kidney injury. He was discharged on Xarelto, Levaquin, steroid taper and 2 L nasal cannula. Acute kidney injury thought multifactorial including contrast injury, ARB, diuretic and Toradol.  Initially felt well when he went home but one day PTA noted increasing shortness of breath and oxygen saturations in the 80s. Breathing status continued to worsen and so he came to the emergency department today for evaluation. He has had some chest pain on the left side from the PE. No specific aggravating or alleviating factors. He has been compliant with Xarelto and antibiotic. No fever at home.  CT showed bilateral pneumonia, bilateral peripheral PE. Treated with ceftriaxone and azithromycin and referred for admission.  Hospital course:  Acute hypoxic respiratory failure Multifocal pneumonia Bilateral pulmonary embolism Patient presenting to ED with progressive shortness of breath. Recently discharged from outside hospital with new diagnosis of PE/DVT and pneumonia. Patient reports compliance with his home Xarelto, and was discharged on 2 L nasal cannula. BNP slightly elevated at 530. WBC count elevated 14.7 on admission. Troponin flat at 23 followed by 21. Recent echocardiogram on 08/24/2019 notable for LVEF 55-65%. CTA PE notable for extensive bilateral airway opacities out of proportion in regards to his pulmonary embolism consistent with multifocal pneumonia.  MRSA PCR negative.  Patient was started on empiric antibiotics with azithromycin and ceftriaxone.  Blood cultures remained negative during the hospitalization.  Sputum culture with rare gram variable  rods.  Patient symptoms progressively improved during hospitalization and he was able to be titrated off of supplemental oxygen without desaturation noted on ambulation with physical therapy at time of discharge.  We will continue antibiotics outpatient with 3 additional days of azithromycin 500 mg p.o. daily in addition to cefdinir 300 mg p.o. twice daily for an additional 10 days.  Continue Xarelto as prescribed previously for PE/DVT.  Recommend follow-up with PCP in 7-10 days following hospitalization.  Bilateral pulmonary embolism Right DVT  Continue Xarelto.  Patient with reported family history of blood clotting in the past, may warrant hypercoagulable work-up once this acute event resolves.  Acute renal failure Most recent creatinine 2.47 on outside hospital discharge on 08/27/2019. Acute kidney injury thought multifactorial including contrast injury, ARB, diuretic and Toradol. Also did have a contrast enhanced CTA on this hospitalization in the ED. creatinine 1.49 at time of discharge.  Recommend repeat BMP in 1 week at PCP visit.  GERD: Continue PPI  Hypokalemia Hypomagnesemia Repleted during hospitalization.  Depression: Continue Lexapro  Diarrhea C. difficile negative on admission.  Continue Imodium prn.  If persists, may warrant outpatient gastroenterology evaluation.  [id number] statin    09/07/2019  Re-establish ov/Jaggar Benko re: f/u sp pe  Admit same day  08/30/19 Chief Complaint  Patient presents with  . Hospitalization Follow-up    recent PNA and bilateral PE. Breathing is improving but not back to baseline.   Dyspnea:  Got worse p started xarelto though cp resolved - now 50% baseline  Cough: assoc with hemoptysis / still a little tight with deep breath / never fever no rigors / cough has mostly resolved now   Sleeping: able to lie flat/ two pillows  SABA use: none needed  02: has as needed stopped using it / sats 92% with activity  rec  no change rx     10/06/2019  f/u ov/Markcus Lazenby re: s/p PE for antiphospholipid ab changed by Heme to  lovenox rx 09/29/19 p hemoptysis developed while  on xarelto 09/26/19 > ER  Chief Complaint  Patient presents with  . Follow-up    Breathing is much imrproved. He did have an isolated episode of hemoptysis.    Dyspnea:  75% improved but has doe x 3 flights not present previously Cough: none, all hemoptysis resolved  Sleeping: flat/ 2 pillows  SABA use: no 02: none  No symptoms from R leg / wearing elastic hose  rec No change rx    01/04/2020  f/u ov/Johnetta Sloniker re: s/p  PE  S/p vasectomy Jan 20th but cough onset a week or two prior to vasectomy done under local Chief Complaint  Patient presents with  . Follow-up    CXR done today. Breathing has been worse over the past 2-3 wks- he has been coughing some and having chest tightness. Cough is occ prod with clear sputum.   Dyspnea:  MMRC1 = can walk nl pace, flat grade, can't hurry or go uphills or steps s sob   Cough: worse with ex, prod clear mucus Sleeping: ok / 2 pillows bed blocks SABA use: none  02: none    No obvious day to day or daytime variability or assoc excess/ purulent sputum or mucus plugs or hemoptysis or cp or  subjective wheeze or overt sinus or hb symptoms.   Sleeping as above  without nocturnal  or early am exacerbation  of respiratory  c/o's or need for noct saba. Also denies any obvious fluctuation of symptoms with weather or environmental changes or other aggravating or alleviating factors except as outlined above   No unusual exposure hx or h/o childhood pna/ asthma or knowledge of premature birth.  Current Allergies, Complete Past Medical History, Past Surgical History, Family History, and Social History were reviewed in [employer] record.  ROS  The following are not active complaints unless bolded Hoarseness, sore throat, dysphagia, dental problems, itching, sneezing,  nasal congestion or discharge of excess  mucus or purulent secretions, ear ache,   fever, chills, sweats, unintended wt loss or wt gain, classically pleuritic or exertional cp,  orthopnea pnd or arm/hand swelling  or leg swelling, presyncope, palpitations, abdominal pain, anorexia, nausea, vomiting, diarrhea  or change in bowel habits or change in bladder habits, change in stools or change in urine, dysuria, hematuria,  rash, arthralgias, visual complaints, headache, numbness, weakness or ataxia or problems with walking or coordination,  change in mood or  memory.        Current Meds  Medication Sig  . acetaminophen (TYLENOL) 500 MG tablet Take 1,000 mg by mouth every 6 (six) hours as needed for mild pain.  Marland Kitchen atorvastatin (LIPITOR) 10 MG tablet Take 1 tablet (10 mg total) by mouth daily.  Marland Kitchen escitalopram (LEXAPRO) 20 MG tablet Take 1 tablet (20 mg total) by mouth daily.  . fluticasone (FLONASE) 50 MCG/ACT nasal spray Place 1 spray into both nostrils daily.   . nitroGLYCERIN (NITROSTAT) 0.4 MG SL tablet Place 1 tablet (0.4 mg total) under the tongue every 5 (five) minutes as needed for chest pain.  Marland Kitchen omeprazole (PRILOSEC) 20 MG capsule Take 20 mg by mouth 2 (two) times daily before a meal.  . telmisartan-hydrochlorothiazide (MICARDIS HCT) 80-25 MG tablet Take 1 tablet by mouth daily.                   Objective:   Physical Exam    amb mod obese male nad    01/04/2020         283  10/06/2019       279 09/07/2019       275   09/08/17 254 lb 2 oz (115.3 kg)  08/19/17 253 lb (114.8 kg)  08/04/17 254 lb (115.2 kg)    Vital signs reviewed  01/04/2020  - Note at rest 02 sats  97% on RA     HEENT : pt wearing mask not removed for exam due to covid -19 concerns.    NECK :  without JVD/Nodes/TM/ nl carotid upstrokes bilaterally   LUNGS: no acc muscle use,  Nl contour chest which is clear to A and P bilaterally without cough on insp or exp maneuvers   CV:  RRR  no s3 or murmur or increase in P2, and no edema/ wearing elastic hose    ABD:  soft and nontender with nl inspiratory excursion in the supine position. No bruits or organomegaly appreciated, bowel sounds nl  MS:  Nl gait/ ext warm without deformities, calf tenderness, cyanosis or clubbing No obvious joint restrictions   SKIN: warm and dry without lesions    NEURO:  alert, approp, nl sensorium with  no motor or cerebellar deficits apparent.      CXR PA and Lateral:   01/04/2020 :    I personally reviewed images and agree with radiology impression as follows:  Mildly prominent bilateral interstitial markings which may reflect sequela of pneumonitis given previous airspace disease. No new focal airspace consolidation.           Assessment:

## 2020-01-04 NOTE — Patient Instructions (Addendum)
Stop prilosec 20mg  (ok to take 2 x 30 min before bfast and supper)   Pantoprazole (protonix) 40 mg  Take 30- 60 min before your first and last meals of the day  this is the best way to tell whether stomach acid is contributing to your problem.     GERD (REFLUX)  is an extremely common cause of respiratory symptoms just like yours , many times with no obvious heartburn at all.    It can be treated with medication, but also with lifestyle changes including elevation of the head of your bed (ideally with 6 -8inch blocks under the headboard of your bed),  Smoking cessation, avoidance of late meals, excessive alcohol, and avoid fatty foods, chocolate, peppermint, colas, red wine, and acidic juices such as orange juice.  NO MINT OR MENTHOL PRODUCTS SO NO COUGH DROPS  USE SUGARLESS CANDY INSTEAD (Jolley ranchers or Stover's or Life Savers) or even ice chips will also do - the key is to swallow to prevent all throat clearing. NO OIL BASED VITAMINS - use powdered substitutes.  Avoid fish oil when coughing.    Please schedule a follow up visit in 3 months but call sooner if needed with pfts on return

## 2020-01-05 ENCOUNTER — Encounter: Payer: Self-pay | Admitting: Internal Medicine

## 2020-01-05 DIAGNOSIS — R05 Cough: Secondary | ICD-10-CM

## 2020-01-05 DIAGNOSIS — R058 Other specified cough: Secondary | ICD-10-CM

## 2020-01-05 HISTORY — DX: Other specified cough: R05.8

## 2020-01-05 HISTORY — DX: Cough: R05

## 2020-01-05 NOTE — Assessment & Plan Note (Signed)
In setting of bilateral PE 08/23/2019 with antiphospholipid syndrome   Chest x-ray pattern is actually improved and he does not have any desaturation with exercise so no additional work-up is needed at this point.

## 2020-01-05 NOTE — Assessment & Plan Note (Signed)
"  quit smoking" 2015 with onset of symptoms in 2016  Spirometry 09/08/2017  Flat f/v loop  - d/c acei 09/08/2017  - 01/04/2020   Walked RA x two laps =  approx 582ft @ fast pace - stopped due to end of study/ min sob with sats of 93 % at the end of the study  Worse sob assoc with cough but exam and chest x-ray are clear and this has much more of a pattern of an upper airway cough.  Please see separate AP

## 2020-01-05 NOTE — Assessment & Plan Note (Signed)
Onset mid Jan 2021 while on otc PPI  - max rx for gerd 01/04/2020 >>>  Upper airway cough syndrome (previously labeled PNDS),  is so named because it's frequently impossible to sort out how much is  CR/sinusitis with freq throat clearing (which can be related to primary GERD)   vs  causing  secondary (" extra esophageal")  GERD from wide swings in gastric pressure that occur with throat clearing, often  promoting self use of mint and menthol lozenges that reduce the lower esophageal sphincter tone and exacerbate the problem further in a cyclical fashion.   These are the same pts (now being labeled as having "irritable larynx syndrome" by some cough centers) who not infrequently have a history of having failed to tolerate ace inhibitors as is the case here,  dry powder inhalers or biphosphonates or report having atypical/extraesophageal reflux symptoms that don't respond to standard doses of PPI (as may be the case here)  and are easily confused as having aecopd or asthma flares by even experienced allergists/ pulmonologists (myself included).   We will first try therefore diet and maximum treatment for acid reflux and then see the patient back if he is not improving on this regimen with full pfts on return.    Medical decision making was a moderate level of complexity in this case because of  One acute and two chronic conditions /diagnoses requiring extra time for  H and P, chart review, counseling,  directly observing portions of ambulatory 02 saturation study  and generating customized AVS unique to this office visit and charting.   Each maintenance medication was reviewed in detail including emphasizing most importantly the difference between maintenance and prns and under what circumstances the prns are to be triggered using an action plan format where appropriate. Please see avs for details which were reviewed in writing by both me and my nurse and patient given a written copy highlighted where  appropriate with yellow highlighter for the patient's continued care at home along with an updated version of their medications.  Patient was asked to maintain medication reconciliation by comparing this list to the actual medications being used at home and to contact this office right away if there is a conflict or discrepancy.

## 2020-01-11 NOTE — Progress Notes (Signed)
Called and left a detailed msg on machine ok per [facility]

## 2020-02-26 ENCOUNTER — Telehealth: Payer: Self-pay | Admitting: Internal Medicine

## 2020-02-26 ENCOUNTER — Encounter: Payer: Self-pay | Admitting: Internal Medicine

## 2020-02-26 NOTE — Telephone Encounter (Signed)
Spoke with the pt  He states that he has been checking his sats over the past couple of weeks and they range from 90-93%ra  He states he has been noticing a slight increase in the amount of chest tightness he was having at last visit here  He feels like his breathing is slightly worse  He moved up his PFT from 4/22 to 4/9  I have scheduled him an appt with TP for f/u on 4/9 after this  We unfortunately have no openings in the office this wk  Pt feels comfortable waiting until then for visit  I advised him to call sooner if needed or seek emergent care if his symptoms persist or worsen  Will forward to APP of the day to ensure to additional recs

## 2020-02-26 NOTE — Telephone Encounter (Signed)
02/26/2020  I am glad the patient called [place].  If the patient is having increased chest tightness as well as increased shortness of breath and oxygen levels are decreasing this is of concern.  I am glad that he decide to move his appointments up.  When I went back and looked at the patient's charting it looks like this is a change from his baseline oxygen levels that are typically around 96 to 97%.  Does the patient have any lower extremity swelling? Any lower extremity leg pain? Is the oxygen level of 90 to 93% when he is resting?  If the patient is having chest tightness, or heart racing or worsened shortness of breath then he will need to present to an emergency room or urgent care for further evaluation in person.   Wyn Quaker, FNP

## 2020-02-26 NOTE — Telephone Encounter (Signed)
LMTCB x 1 

## 2020-02-27 NOTE — Telephone Encounter (Signed)
ATC pt, no answer. Left message for pt to call back.  

## 2020-02-27 NOTE — Telephone Encounter (Signed)
Spoke with patient. He denies any lower extremity swelling or new pain in his legs. Patient stated that he does have some cramping in his legs but that this is normal for him. Patient is pleased that we were able to move his appointment up and he [place] being seen in office on Friday. Told patient that if he started having chest tightness, or heart racing or worsened shortness of breath then he will need to present to an emergency room or urgent care for further evaluation in person. Patient expressed understanding. Nothing further needed at this time.

## 2020-02-29 ENCOUNTER — Other Ambulatory Visit: Payer: Self-pay | Admitting: Family

## 2020-02-29 ENCOUNTER — Encounter: Payer: Self-pay | Admitting: Physician Assistant

## 2020-02-29 DIAGNOSIS — I2699 Other pulmonary embolism without acute cor pulmonale: Secondary | ICD-10-CM

## 2020-02-29 DIAGNOSIS — I82409 Acute embolism and thrombosis of unspecified deep veins of unspecified lower extremity: Secondary | ICD-10-CM

## 2020-03-01 ENCOUNTER — Other Ambulatory Visit: Payer: Self-pay | Admitting: Family

## 2020-03-05 ENCOUNTER — Other Ambulatory Visit (HOSPITAL_COMMUNITY)
Admission: RE | Admit: 2020-03-05 | Discharge: 2020-03-05 | Disposition: A | Discharge status: Home / Self Care | Payer: BC Managed Care – PPO | Source: Ambulatory Visit | Attending: Internal Medicine | Admitting: Internal Medicine

## 2020-03-05 DIAGNOSIS — Z01812 Encounter for preprocedural laboratory examination: Secondary | ICD-10-CM | POA: Insufficient documentation

## 2020-03-05 DIAGNOSIS — Z20822 Contact with and (suspected) exposure to covid-19: Secondary | ICD-10-CM | POA: Insufficient documentation

## 2020-03-05 LAB — SARS CORONAVIRUS 2 (TAT 6-24 HRS): SARS Coronavirus 2: NEGATIVE

## 2020-03-08 ENCOUNTER — Ambulatory Visit (INDEPENDENT_AMBULATORY_CARE_PROVIDER_SITE_OTHER): Payer: BC Managed Care – PPO | Admitting: Internal Medicine

## 2020-03-08 ENCOUNTER — Ambulatory Visit (INDEPENDENT_AMBULATORY_CARE_PROVIDER_SITE_OTHER): Payer: BC Managed Care – PPO

## 2020-03-08 ENCOUNTER — Other Ambulatory Visit: Payer: Self-pay

## 2020-03-08 ENCOUNTER — Encounter: Payer: Self-pay | Admitting: Adult Health

## 2020-03-08 ENCOUNTER — Ambulatory Visit: Payer: BC Managed Care – PPO | Admitting: Adult Health

## 2020-03-08 VITALS — BP 120/86 | HR 89 | Temp 98.0°F | Ht 72.0 in | Wt 285.6 lb

## 2020-03-08 DIAGNOSIS — I2699 Other pulmonary embolism without acute cor pulmonale: Secondary | ICD-10-CM | POA: Diagnosis not present

## 2020-03-08 DIAGNOSIS — J181 Lobar pneumonia, unspecified organism: Secondary | ICD-10-CM

## 2020-03-08 DIAGNOSIS — R918 Other nonspecific abnormal finding of lung field: Secondary | ICD-10-CM

## 2020-03-08 DIAGNOSIS — R05 Cough: Secondary | ICD-10-CM | POA: Diagnosis not present

## 2020-03-08 DIAGNOSIS — R058 Other specified cough: Secondary | ICD-10-CM

## 2020-03-08 LAB — PULMONARY FUNCTION TEST
DL/VA % pred: 99 %
DL/VA: 4.37 ml/min/mmHg/L
DLCO cor % pred: 77 %
DLCO cor: 26.46 ml/min/mmHg
DLCO unc % pred: 77 %
DLCO unc: 26.46 ml/min/mmHg
FEF 25-75 Post: 5.54 L/sec
FEF 25-75 Pre: 4.78 L/sec
FEF2575-%Change-Post: 16 %
FEF2575-%Pred-Post: 136 %
FEF2575-%Pred-Pre: 117 %
FEV1-%Change-Post: 3 %
FEV1-%Pred-Post: 83 %
FEV1-%Pred-Pre: 80 %
FEV1-Post: 3.84 L
FEV1-Pre: 3.71 L
FEV1FVC-%Change-Post: 4 %
FEV1FVC-%Pred-Pre: 109 %
FEV6-%Change-Post: 0 %
FEV6-%Pred-Post: 74 %
FEV6-%Pred-Pre: 74 %
FEV6-Post: 4.29 L
FEV6-Pre: 4.3 L
FEV6FVC-%Pred-Post: 103 %
FEV6FVC-%Pred-Pre: 103 %
FVC-%Change-Post: 0 %
FVC-%Pred-Post: 72 %
FVC-%Pred-Pre: 72 %
FVC-Post: 4.29 L
FVC-Pre: 4.31 L
Post FEV1/FVC ratio: 90 %
Post FEV6/FVC ratio: 100 %
Pre FEV1/FVC ratio: 86 %
Pre FEV6/FVC Ratio: 100 %
RV % pred: 67 %
RV: 1.5 L
TLC % pred: 76 %
TLC: 5.98 L

## 2020-03-08 MED ORDER — PANTOPRAZOLE SODIUM 40 MG PO TBEC: 40.0000 mg | DELAYED_RELEASE_TABLET | Freq: Two times a day (BID) | ORAL | 1 refills | Status: DC

## 2020-03-08 NOTE — Progress Notes (Signed)
PFT done today. 

## 2020-03-08 NOTE — Progress Notes (Signed)
@Patient  ID: Wesley Hancock, male    DOB: [birth year], 49 y.o.   MRN: [id number]  Chief Complaint  Patient presents with  . Follow-up    Referring provider: Inda Coke, PA  HPI: 49 year old male former smoker followed for history of recurrent PE with antiphospholipid antibody syndrome and mild protein S activity deficiency on chronic anticoagulation with Lovenox (followed by hematology)   TEST/EVENTS :  Spirometry 09/08/2017  Flat f/v loop   Hospitalization September 2021 with bilateral PE, lower extremity DVT, bilateral pneumonia with acute respiratory distress treated with Xarelto, acute kidney injury thought to be multifactorial with contrast injury, ARB diuretic and Toradol Readmitted October 2020 with multifocal pneumonia treated with aggressive antibiotics  03/08/2020 Follow up : PE , Abnormal CT chest  Patient presents for a 46-month follow-up.  Since last visit patient says he is doing well.  Breathing is back to baseline.  Says he has no increased cough or shortness of breath.  Pulmonary function testing done today shows essentially normal lung function with only mild restriction  with FEV1 at 83%, ratio 90, FVC 72%, no significant bronchodilator response and DLCO 77%. Patient says he is trying to stay active.  Is working on weight loss.  He denies any coughing or wheezing  He is followed by hematology for known antiphospholipid antibody syndrome and mild protein S activity deficiency.  Previously on Xarelto but switched to Lovenox as patient preferred Lovenox over Coumadin.  He denies any known bleeding     Allergies  Allergen Reactions  . Diltiazem Hcl Diarrhea    Lethargic as well    Immunization History  Administered Date(s) Administered  . Influenza,inj,Quad PF,6+ Mos 09/14/2016, 09/08/2017, 09/06/2019  . Influenza,inj,quad, With Preservative 09/18/2015  . PFIZER SARS-COV-2 Vaccination 02/19/2020, 03/04/2020  . Td 11/11/2016  . Tdap 02/28/2019    Past  Medical History:  Diagnosis Date  . Depression   . DVT (deep venous thrombosis) ([place])   . High cholesterol   . Hyperlipidemia   . Nutcracker esophagus     Tobacco History: Social History   Tobacco Use  Smoking Status Former Smoker  . Packs/day: 1.00  . Years: 20.00  . Pack years: 20.00  . Types: Cigarettes  . Quit date: 11/30/2013  . Years since quitting: 6.2  Smokeless Tobacco Never Used   Counseling given: Not Answered   Outpatient Medications Prior to Visit  Medication Sig Dispense Refill  . acetaminophen (TYLENOL) 500 MG tablet Take 1,000 mg by mouth every 6 (six) hours as needed for mild pain.    Marland Kitchen atorvastatin (LIPITOR) 10 MG tablet Take 1 tablet (10 mg total) by mouth daily. 30 tablet 5  . enoxaparin (LOVENOX) 120 MG/0.8ML injection Inject 0.8 mLs (120 mg total) into the skin every 12 (twelve) hours. 80 mL 1  . escitalopram (LEXAPRO) 20 MG tablet Take 1 tablet (20 mg total) by mouth daily. 90 tablet 2  . fluticasone (FLONASE) 50 MCG/ACT nasal spray Place 1 spray into both nostrils daily.     . nitroGLYCERIN (NITROSTAT) 0.4 MG SL tablet Place 1 tablet (0.4 mg total) under the tongue every 5 (five) minutes as needed for chest pain. 15 tablet 2  . pantoprazole (PROTONIX) 40 MG tablet Take 30- 60 min before your first and last meals of the day 60 tablet 2  . telmisartan-hydrochlorothiazide (MICARDIS HCT) 80-25 MG tablet Take 1 tablet by mouth daily. 90 tablet 2   No facility-administered medications prior to visit.     Review of Systems:  Constitutional:   No  weight loss, night sweats,  Fevers, chills, + fatigue, or  lassitude.  HEENT:   No headaches,  Difficulty swallowing,  Tooth/dental problems, or  Sore throat,                No sneezing, itching, ear ache, nasal congestion, post nasal drip,   CV:  No chest pain,  Orthopnea, PND, swelling in lower extremities, anasarca, dizziness, palpitations, syncope.   GI  No heartburn, indigestion, abdominal pain, nausea,  vomiting, diarrhea, change in bowel habits, loss of appetite, bloody stools.   Resp: No shortness of breath with exertion or at rest.  No excess mucus, no productive cough,  No non-productive cough,  No coughing up of blood.  No change in color of mucus.  No wheezing.  No chest wall deformity  Skin: no rash or lesions.  GU: no dysuria, change in color of urine, no urgency or frequency.  No flank pain, no hematuria   MS:  No joint pain or swelling.  No decreased range of motion.  No back pain.    Physical Exam  BP 120/86 (BP Location: Left Arm, Cuff Size: Normal)   Pulse 89   Temp 98 F (36.7 C)   Ht 6' (1.829 m)   Wt 285 lb 9.6 oz (129.5 kg)   SpO2 96%   BMI 38.73 kg/m   GEN: A/Ox3; pleasant , NAD, well nourished    HEENT:  [place]/AT,  , NOSE-clear, THROAT-clear, no lesions, no postnasal drip or exudate noted.   NECK:  Supple w/ fair ROM; no JVD; normal carotid impulses w/o bruits; no thyromegaly or nodules palpated; no lymphadenopathy.    RESP  Clear  P & A; w/o, wheezes/ rales/ or rhonchi. no accessory muscle use, no dullness to percussion  CARD:  RRR, no m/r/g, no peripheral edema, pulses intact, no cyanosis or clubbing.  GI:   Soft & nt; nml bowel sounds; no organomegaly or masses detected.   Musco: Warm bil, no deformities or joint swelling noted.   Neuro: alert, no focal deficits noted.    Skin: Warm, no lesions or rashes    Lab Results:   BNP  ProBNP No results found for: PROBNP  Imaging: No results found.    PFT Results Latest Ref Rng & Units 03/08/2020  FVC-Pre L 4.31  FVC-Predicted Pre % 72  FVC-Post L 4.29  FVC-Predicted Post % 72  Pre FEV1/FVC % % 86  Post FEV1/FCV % % 90  FEV1-Pre L 3.71  FEV1-Predicted Pre % 80  FEV1-Post L 3.84  DLCO UNC% % 77  DLCO COR %Predicted % 99  TLC L 5.98  TLC % Predicted % 76  RV % Predicted % 67    No results found for: NITRICOXIDE      Assessment & Plan:   No problem-specific Assessment & Plan  notes found for this encounter.     Rexene Edison, NP 03/08/2020

## 2020-03-08 NOTE — Patient Instructions (Addendum)
Activity as tolerated,  Albuterol inhaler As needed  Wheezing /shortness of breath  Chest xray today .  Continue on Lovenex , and follow up with Hematology .  Follow up with Dr. Melvyn Novas  In 3-4 months and As needed

## 2020-03-11 NOTE — Progress Notes (Signed)
LMTCB

## 2020-03-11 NOTE — Assessment & Plan Note (Signed)
PFTs show only mild airflow restriction.  Symptoms seem to be improving.  May use albuterol as needed.

## 2020-03-11 NOTE — Assessment & Plan Note (Signed)
Bilateral PE diagnosed September 2020.  Hematology work-up showed antiphospholipid syndrome and mild protein S deficiency patient remains on Lovenox.  Clinically is doing well.  Patient education on anticoagulation.

## 2020-03-11 NOTE — Assessment & Plan Note (Signed)
Chest x-ray today shows ongoing clearance. Patient is clinically improving.

## 2020-03-20 NOTE — Progress Notes (Signed)
LMTCB

## 2020-03-21 ENCOUNTER — Ambulatory Visit: Payer: BC Managed Care – PPO | Admitting: Internal Medicine

## 2020-03-22 NOTE — Progress Notes (Signed)
Spoke with pt and notified of results per Dr. Wert. Pt verbalized understanding and denied any questions. 

## 2020-03-29 ENCOUNTER — Other Ambulatory Visit (HOSPITAL_COMMUNITY): Payer: BC Managed Care – PPO

## 2020-04-01 ENCOUNTER — Ambulatory Visit: Payer: BC Managed Care – PPO | Admitting: Internal Medicine

## 2020-04-03 ENCOUNTER — Encounter: Payer: Self-pay | Admitting: Physician Assistant

## 2020-04-04 ENCOUNTER — Telehealth (INDEPENDENT_AMBULATORY_CARE_PROVIDER_SITE_OTHER): Payer: BC Managed Care – PPO | Admitting: Family Medicine

## 2020-04-04 ENCOUNTER — Telehealth: Payer: Self-pay

## 2020-04-04 DIAGNOSIS — R197 Diarrhea, unspecified: Secondary | ICD-10-CM | POA: Diagnosis not present

## 2020-04-04 NOTE — Progress Notes (Signed)
Virtual Visit via Video Note  I connected with Wesley Hancock  on 04/04/20 at 10:50 AM EDT by a video enabled telemedicine application and verified that I am speaking with the correct person using two identifiers.  Location patient: car, [place] Location provider:work or home office Persons participating in the virtual visit: patient, provider  I discussed the limitations of evaluation and management by telemedicine and the availability of in person appointments. The patient expressed understanding and agreed to proceed.   HPI:  Acute visit for diarrhea: -reports started 6 months ago when was in hospital for clotting disorder with hx of DVT, PE in 2020, APS -has had issues with diarrhea since that hospitalization (mild and rare prior to that) -symptoms include: 5-7 episodes of explosive watery diarrhea daily, intermittently, several times per week on average -this is sometimes interfering with work and he requests a work not for March 30th and yesterday for this issue -denies fevers, abd pain, vomiting, weight loss (has actually had weight gain - feels has had decreased activity level due to lung issues), melena, hematochezia, mucus in stools, foul smell -has not seen GI or had work up for this -reports had some labs done in the hospital for the diarrhea   ROS: See pertinent positives and negatives per HPI.  Past Medical History:  Diagnosis Date  . Depression   . DVT (deep venous thrombosis) ([place])   . High cholesterol   . Hyperlipidemia   . Nutcracker esophagus     Past Surgical History:  Procedure Laterality Date  . CHOLECYSTECTOMY      Family History  Problem Relation Age of Onset  . Diabetes Mother   . Hyperlipidemia Mother   . Hypertension Mother   . Heart disease Father 93  . Hyperlipidemia Father   . Hypertension Father   . Prostate cancer Father 56  . Lung cancer Father 76       Dx 06/18/2017  . Diabetes Sister   . Hyperlipidemia Sister   . Hypertension Sister   .  Diabetes Maternal Grandmother   . Heart disease Maternal Grandmother   . Hyperlipidemia Maternal Grandmother   . Hypertension Maternal Grandmother   . Colon cancer Maternal Grandmother   . Heart disease Maternal Grandfather   . Hyperlipidemia Maternal Grandfather   . Hypertension Maternal Grandfather   . Stroke Maternal Grandfather   . Liver cancer Maternal Grandfather   . Heart disease Paternal Grandmother     SOCIAL HX: see hpi   Current Outpatient Medications:  .  acetaminophen (TYLENOL) 500 MG tablet, Take 1,000 mg by mouth every 6 (six) hours as needed for mild pain., Disp: , Rfl:  .  atorvastatin (LIPITOR) 10 MG tablet, Take 1 tablet (10 mg total) by mouth daily., Disp: 30 tablet, Rfl: 5 .  enoxaparin (LOVENOX) 120 MG/0.8ML injection, Inject 0.8 mLs (120 mg total) into the skin every 12 (twelve) hours., Disp: 80 mL, Rfl: 1 .  escitalopram (LEXAPRO) 20 MG tablet, Take 1 tablet (20 mg total) by mouth daily., Disp: 90 tablet, Rfl: 2 .  fluticasone (FLONASE) 50 MCG/ACT nasal spray, Place 1 spray into both nostrils daily. , Disp: , Rfl:  .  nitroGLYCERIN (NITROSTAT) 0.4 MG SL tablet, Place 1 tablet (0.4 mg total) under the tongue every 5 (five) minutes as needed for chest pain., Disp: 15 tablet, Rfl: 2 .  pantoprazole (PROTONIX) 40 MG tablet, Take 1 tablet (40 mg total) by mouth 2 (two) times daily., Disp: 180 tablet, Rfl: 1 .  telmisartan-hydrochlorothiazide (MICARDIS  HCT) 80-25 MG tablet, Take 1 tablet by mouth daily., Disp: 90 tablet, Rfl: 2  EXAM:  VITALS per patient if applicable: denies fever  GENERAL: alert, oriented, appears well and in no acute distress  HEENT: atraumatic, conjunttiva clear, no obvious abnormalities on inspection of external nose and ears  NECK: normal movements of the head and neck  LUNGS: on inspection no signs of respiratory distress, breathing rate appears normal, no obvious gross SOB, gasping or wheezing  CV: no obvious cyanosis  ABD: denies abd  pain   MS: moves all visible extremities without noticeable abnormality  PSYCH/NEURO: pleasant and cooperative, no obvious depression or anxiety, speech and thought processing grossly intact  ASSESSMENT AND PLAN:  Discussed the following assessment and plan:  Diarrhea, unspecified type  -we discussed possible serious and likely etiologies, options for evaluation and workup, limitations of telemedicine visit vs in person visit, treatment, treatment risks and precautions. Pt prefers to treat via telemedicine empirically rather then risking or undertaking an in person visit at this moment. Discussed various potential causes of chronic diarrhea and options for work up. Query IBS, but needs evaluation to exclude other etiologies. He prefers to see GI for this evaluation and referral was placed. Advised that he contact [facility] or our office if he has not heard back about the referral in the next 1 week. In the interim imodium if needed (he reports this actually works well when he takes it.) Generic work slip for the days he requested in patient instructions. Patient agrees to seek prompt in person care if worsening, new symptoms arise, or if is not improving with treatment.  I discussed the assessment and treatment plan with the patient. The patient was provided an opportunity to ask questions and all were answered. The patient agreed with the plan and demonstrated an understanding of the instructions.   The patient was advised to call back or seek an in-person evaluation if the symptoms worsen or if the condition fails to improve as anticipated.   Lucretia Kern, DO

## 2020-04-04 NOTE — Telephone Encounter (Signed)
Patient  Is scheduled for Virtual appt with Dr. Maudie Mercury today at 10:50am. Patient states that he need doctor note for 4/6 and 4/30 for missing work due to diarrhea.

## 2020-04-04 NOTE — Patient Instructions (Signed)
-  We placed a referral for you as discussed. It usually takes about 1 week to process and schedule this referral. If you have not heard from [place] regarding this appointment in 1 week please contact our office.  -can try imodium per instructions as needed in the interim  -some find that a dairy free diet is helpful with diarrhea  I hope you are feeling better soon! Seek care promptly if your symptoms worsen, new concerns arise or you are not improving with treatment.     WORK SLIP:  Patient Wesley Hancock, DOB [birth year], was seen today, 04/04/2020 for medical care. Please excuse him from work for a medical illness on April 30th, 2021 and on May 5th, 2021.  Sincerely: E-signature: Dr. Colin Benton, DO [place] Ph: [phone]

## 2020-04-18 ENCOUNTER — Inpatient Hospital Stay: Payer: BC Managed Care – PPO | Admitting: Family

## 2020-04-18 ENCOUNTER — Inpatient Hospital Stay: Payer: BC Managed Care – PPO

## 2020-04-19 ENCOUNTER — Encounter: Payer: Self-pay | Admitting: Family

## 2020-04-19 ENCOUNTER — Inpatient Hospital Stay (HOSPITAL_BASED_OUTPATIENT_CLINIC_OR_DEPARTMENT_OTHER): Payer: BC Managed Care – PPO | Admitting: Family

## 2020-04-19 ENCOUNTER — Inpatient Hospital Stay: Payer: BC Managed Care – PPO | Attending: Family

## 2020-04-19 ENCOUNTER — Telehealth: Payer: Self-pay | Admitting: Family

## 2020-04-19 ENCOUNTER — Other Ambulatory Visit: Payer: Self-pay

## 2020-04-19 VITALS — BP 132/95 | HR 87 | Temp 96.9°F | Resp 18 | Ht 73.0 in | Wt 284.8 lb

## 2020-04-19 DIAGNOSIS — I2699 Other pulmonary embolism without acute cor pulmonale: Secondary | ICD-10-CM | POA: Insufficient documentation

## 2020-04-19 DIAGNOSIS — D6859 Other primary thrombophilia: Secondary | ICD-10-CM | POA: Diagnosis not present

## 2020-04-19 DIAGNOSIS — D6861 Antiphospholipid syndrome: Secondary | ICD-10-CM | POA: Diagnosis not present

## 2020-04-19 DIAGNOSIS — I82409 Acute embolism and thrombosis of unspecified deep veins of unspecified lower extremity: Secondary | ICD-10-CM | POA: Diagnosis not present

## 2020-04-19 DIAGNOSIS — R197 Diarrhea, unspecified: Secondary | ICD-10-CM | POA: Diagnosis not present

## 2020-04-19 DIAGNOSIS — Z86711 Personal history of pulmonary embolism: Secondary | ICD-10-CM | POA: Insufficient documentation

## 2020-04-19 LAB — CBC WITH DIFFERENTIAL (CANCER CENTER ONLY)
Abs Immature Granulocytes: 0.07 10*3/uL (ref 0.00–0.07)
Basophils Absolute: 0 10*3/uL (ref 0.0–0.1)
Basophils Relative: 1 %
Eosinophils Absolute: 0.2 10*3/uL (ref 0.0–0.5)
Eosinophils Relative: 4 %
HCT: 45.3 % (ref 39.0–52.0)
Hemoglobin: 15.4 g/dL (ref 13.0–17.0)
Immature Granulocytes: 1 %
Lymphocytes Relative: 26 %
Lymphs Abs: 1.5 10*3/uL (ref 0.7–4.0)
MCH: 30.4 pg (ref 26.0–34.0)
MCHC: 34 g/dL (ref 30.0–36.0)
MCV: 89.5 fL (ref 80.0–100.0)
Monocytes Absolute: 0.5 10*3/uL (ref 0.1–1.0)
Monocytes Relative: 8 %
Neutro Abs: 3.4 10*3/uL (ref 1.7–7.7)
Neutrophils Relative %: 60 %
Platelet Count: 212 10*3/uL (ref 150–400)
RBC: 5.06 MIL/uL (ref 4.22–5.81)
RDW: 12.7 % (ref 11.5–15.5)
WBC Count: 5.6 10*3/uL (ref 4.0–10.5)
nRBC: 0 % (ref 0.0–0.2)

## 2020-04-19 LAB — CMP (CANCER CENTER ONLY)
ALT: 68 U/L — ABNORMAL HIGH (ref 0–44)
AST: 35 U/L (ref 15–41)
Albumin: 4.4 g/dL (ref 3.5–5.0)
Alkaline Phosphatase: 50 U/L (ref 38–126)
Anion gap: 9 (ref 5–15)
BUN: 24 mg/dL — ABNORMAL HIGH (ref 6–20)
CO2: 27 mmol/L (ref 22–32)
Calcium: 10 mg/dL (ref 8.9–10.3)
Chloride: 101 mmol/L (ref 98–111)
Creatinine: 1.44 mg/dL — ABNORMAL HIGH (ref 0.61–1.24)
GFR, Est AFR Am: >60 mL/min (ref >60)
GFR, Estimated: 57 mL/min — ABNORMAL LOW (ref >60)
Glucose, Bld: 124 mg/dL — ABNORMAL HIGH (ref 70–99)
Potassium: 4 mmol/L (ref 3.5–5.1)
Sodium: 137 mmol/L (ref 135–145)
Total Bilirubin: 0.6 mg/dL (ref 0.3–1.2)
Total Protein: 8.3 g/dL — ABNORMAL HIGH (ref 6.5–8.1)

## 2020-04-19 LAB — D-DIMER, QUANTITATIVE: D-Dimer, Quant: 1.4 ug/mL-FEU — ABNORMAL HIGH (ref 0.00–0.50)

## 2020-04-19 NOTE — Telephone Encounter (Signed)
Appointments scheduled calendar printed & mailed per 5/21 los

## 2020-04-19 NOTE — Progress Notes (Signed)
Hematology and Oncology Follow Up Visit  Estevon Fabiano [id number] 1971-06-13 49 y.o. 04/19/2020   Principle Diagnosis:  Antiphospholipid antibody syndrome  Low protein S activity Bilateral pulmonary emboli with no heart strain and right lower extremity DVT  Current Therapy:        Lovenox 120 mg SQ BID   Interim History:  Mr. Claywell is here today for follow-up. He is doing fairly well but is having issues with diarrhea. He has an appointment soon with GI for further evaluation. He will likely be having a colonoscopy and will hold his Lovenox 2 days before and restart the day after.  He is doing well on Lovenox and has not had any issues with abnormal bleeding. No bruising or petechiae.  He has SOB with exertion at times and is seeing pulmonology for residual scarring/damage of the lungs from his PE's and pneumonia.  He has been wearing compression stockings on both legs for added support. He states that he has had no swelling or tenderness.  No numbness or tingling.  No falls or syncopal episodes to report.  He is eating well and making sure to stay well hydrated. His weight is stable.   ECOG Performance Status: 1 - Symptomatic but completely ambulatory  Medications:  Allergies as of 04/19/2020      Reactions   Diltiazem Hcl Diarrhea, Other (See Comments)   Lethargic       Medication List       Accurate as of Apr 19, 2020  8:58 AM. If you have any questions, ask your nurse or doctor.        acetaminophen 500 MG tablet Commonly known as: TYLENOL Take 1,000 mg by mouth every 6 (six) hours as needed for mild pain.   atorvastatin 10 MG tablet Commonly known as: LIPITOR Take 1 tablet (10 mg total) by mouth daily.   enoxaparin 120 MG/0.8ML injection Commonly known as: LOVENOX Inject 0.8 mLs (120 mg total) into the skin every 12 (twelve) hours.   escitalopram 20 MG tablet Commonly known as: LEXAPRO Take 1 tablet (20 mg total) by mouth daily.   fluticasone 50  MCG/ACT nasal spray Commonly known as: FLONASE Place 1 spray into both nostrils daily.   nitroGLYCERIN 0.4 MG SL tablet Commonly known as: NITROSTAT Place 1 tablet (0.4 mg total) under the tongue every 5 (five) minutes as needed for chest pain.   pantoprazole 40 MG tablet Commonly known as: Protonix Take 1 tablet (40 mg total) by mouth 2 (two) times daily.   telmisartan-hydrochlorothiazide 80-25 MG tablet Commonly known as: MICARDIS HCT Take 1 tablet by mouth daily.       Allergies:  Allergies  Allergen Reactions  . Diltiazem Hcl Diarrhea and Other (See Comments)    Lethargic     Past Medical History, Surgical history, Social history, and Family History were reviewed and updated.  Review of Systems: All other 10 point review of systems is negative.   Physical Exam:  height is 6\' 1"  (1.854 m) and weight is 284 lb 12.8 oz (129.2 kg). His temporal temperature is 96.9 F (36.1 C) (abnormal). His blood pressure is 132/95 (abnormal) and his pulse is 87. His respiration is 18 and oxygen saturation is 98%.   Wt Readings from Last 3 Encounters:  04/19/20 284 lb 12.8 oz (129.2 kg)  03/08/20 285 lb 9.6 oz (129.5 kg)  01/04/20 283 lb (128.4 kg)    Ocular: Sclerae unicteric, pupils equal, round and reactive to light Ear-nose-throat: Oropharynx clear, dentition fair  Lymphatic: No cervical or supraclavicular adenopathy Lungs no rales or rhonchi, good excursion bilaterally Heart regular rate and rhythm, no murmur appreciated Abd soft, nontender, positive bowel sounds, no liver or spleen tip palpated on exam, no fluid wave  MSK no focal spinal tenderness, no joint edema Neuro: non-focal, well-oriented, appropriate affect Breasts: Deferred   Lab Results  Component Value Date   WBC 5.6 04/19/2020   HGB 15.4 04/19/2020   HCT 45.3 04/19/2020   MCV 89.5 04/19/2020   PLT 212 04/19/2020   Lab Results  Component Value Date   FERRITIN 135 12/20/2019   IRON 45 12/20/2019   TIBC  390 12/20/2019   UIBC 345 12/20/2019   IRONPCTSAT 12 (L) 12/20/2019   Lab Results  Component Value Date   RETICCTPCT 1.8 09/19/2019   RBC 5.06 04/19/2020   No results found for: KPAFRELGTCHN, LAMBDASER, KAPLAMBRATIO No results found for: IGGSERUM, IGA, IGMSERUM No results found for: Odetta Pink, SPEI   Chemistry      Component Value Date/Time   NA 138 12/20/2019 0829   K 4.0 12/20/2019 0829   CL 105 12/20/2019 0829   CO2 26 12/20/2019 0829   BUN 19 12/20/2019 0829   CREATININE 1.10 12/20/2019 0829      Component Value Date/Time   CALCIUM 9.6 12/20/2019 0829   ALKPHOS 47 12/20/2019 0829   AST 20 12/20/2019 0829   ALT 47 (H) 12/20/2019 0829   BILITOT 0.8 12/20/2019 0829       Impression and Plan: Mr. Rummel is a very pleasant 49 yo caucasian gentleman with antiphospholipid antibody syndrome and mild protein S activity deficiency. He has history of bilateral pulmonary emboli with no heart strain and right lower extremity DVT.  He is doing well on Lovenox BID and so far there has been no evidence of recurrence.  We will plan to see him back in another 6 months.  He will continue his same regimen with Lovenox and contact our office with any questions or concerns. We can certainly see him sooner if needed.   [name], NP 5/21/20218:58 AM

## 2020-04-20 LAB — PROTEIN S, TOTAL: Protein S Ag, Total: 120 % (ref 60–150)

## 2020-04-20 LAB — CARDIOLIPIN ANTIBODIES, IGG, IGM, IGA
Anticardiolipin IgA: 29 APL U/mL — ABNORMAL HIGH (ref 0–11)
Anticardiolipin IgG: 86 GPL U/mL — ABNORMAL HIGH (ref 0–14)
Anticardiolipin IgM: 19 MPL U/mL — ABNORMAL HIGH (ref 0–12)

## 2020-04-20 LAB — PROTEIN S ACTIVITY: Protein S Activity: 75 % (ref 63–140)

## 2020-04-21 LAB — PTT-LA MIX: PTT-LA Mix: 97.1 s — ABNORMAL HIGH (ref 0.0–48.9)

## 2020-04-21 LAB — DRVVT CONFIRM: dRVVT Confirm: 2.5 ratio — ABNORMAL HIGH (ref 0.8–1.2)

## 2020-04-21 LAB — DRVVT MIX: dRVVT Mix: 80.2 s — ABNORMAL HIGH (ref 0.0–40.4)

## 2020-04-21 LAB — LUPUS ANTICOAGULANT PANEL
DRVVT: 113.1 s — ABNORMAL HIGH (ref 0.0–47.0)
PTT Lupus Anticoagulant: 103.9 s — ABNORMAL HIGH (ref 0.0–51.9)

## 2020-04-21 LAB — HEXAGONAL PHASE PHOSPHOLIPID: Hexagonal Phase Phospholipid: 0 s (ref 0–11)

## 2020-04-25 ENCOUNTER — Encounter: Payer: Self-pay | Admitting: Nurse Practitioner

## 2020-04-26 ENCOUNTER — Encounter: Payer: Self-pay | Admitting: Nurse Practitioner

## 2020-05-19 NOTE — Progress Notes (Signed)
05/21/2020 Wesley Hancock [id number] Nov 06, 1971   Chief Complaint:  Diarrhea   History of Present Illness:  Wesley Hancock is a 49 year old male with a past medical history of anxiety, depression, antiphospholipid antibody syndrome, low protein S, bilateral PE, RLE DVT and pneumonia 08/2019 on Lovenox 120mg  SQ bid, bilateral pneumonia 08/2019, hyperlipidemia and nutcracker esophagus. S/P cholecystectomy 2019. He was initially seen in our office by Dr. Loletha Carrow on 08/19/2017 for further evaluation for noncardiac chest pain.  He reported having an extensive negative cardiac work up and saw a GI in [place] who did an EGD which sowed reflux and nutcracker esophagus. He was seen by a GI at [facility] and a high resolution esophageal manometry was done which was normal. His chest pain symptoms abated, he last used NTG one year ago. He remains on Pantoprazole 40mg  bid.    He presents today for further evaluation for diarrhea.  He reports having nonbloody diarrhea 3 days weekly which has progressively worsened over the past year.  He typically has 2 to 3 days of constipation followed by 3 to 4 days of diarrhea.  On the days he has diarrhea, he reports passing 3-4 episodes of brown watery diarrhea without blood.  Increased urgency at times.  No associated abdominal pain.  He describes seeing "oil slick" in the water when having a diarrhea stool.  He takes Imodium liquid one capful PRN. Rarely takes Pepto bismol. No weight loss.  He eats cheese most days and he drinks milk once weekly.  He denies having any dysphagia or heartburn.  No recent chest pain.  He is taking Pantoprazole 40 mg once daily.  He last took antibiotics October 2020 when diagnosed with pneumonia.  He has been on Lexapro for 5 years, dose was last increased one year ago. On Micardis HCT x 2 years. No NSAID use.  His maternal grandfather was diagnosed with colon cancer in his 48s.  Father with history of colon polyps,  prostate and lung  cancer.  He underwent a colonoscopy approximately 5 years ago in [place], result unknown.  He remains on Lovenox twice daily as followed by hematology NP [name]. He continues pulmonary follow up with Inda Coke PA-C. His most recent pulmonary function testing done 03/08/2020 showed essentially normal lung function with only mild restriction  with FEV1 at 83%, ratio 90, FVC 72%, no significant bronchodilator response and DLCO 77%.    Current Outpatient Medications on File Prior to Visit  Medication Sig Dispense Refill  . acetaminophen (TYLENOL) 500 MG tablet Take 1,000 mg by mouth every 6 (six) hours as needed for mild pain.    Marland Kitchen atorvastatin (LIPITOR) 10 MG tablet Take 1 tablet (10 mg total) by mouth daily. 30 tablet 5  . enoxaparin (LOVENOX) 120 MG/0.8ML injection Inject 0.8 mLs (120 mg total) into the skin every 12 (twelve) hours. 80 mL 1  . escitalopram (LEXAPRO) 20 MG tablet Take 1 tablet (20 mg total) by mouth daily. 90 tablet 2  . fluticasone (FLONASE) 50 MCG/ACT nasal spray Place 1 spray into both nostrils daily.     . pantoprazole (PROTONIX) 40 MG tablet Take 1 tablet (40 mg total) by mouth 2 (two) times daily. 180 tablet 1  . telmisartan-hydrochlorothiazide (MICARDIS HCT) 80-25 MG tablet Take 1 tablet by mouth daily. 90 tablet 2  . nitroGLYCERIN (NITROSTAT) 0.4 MG SL tablet Place 1 tablet (0.4 mg total) under the tongue every 5 (five) minutes as needed for chest pain. (Patient not taking:  Reported on 05/21/2020) 15 tablet 2   No current facility-administered medications on file prior to visit.    Allergies  Allergen Reactions  . Diltiazem Hcl Diarrhea and Other (See Comments)    Lethargic     Current Medications, Allergies, Past Medical History, Past Surgical History, Family History and Social History were reviewed in [employer] record.  Review of Systems:   Physical Exam: BP 124/82 (BP Location: Left Arm, Patient Position: Sitting, Cuff  Size: Normal)   Pulse (!) 104   Ht 6\' 1"  (1.854 m) Comment: height measured without shoes  Wt 285 lb (129.3 kg)   BMI 37.60 kg/m ' General: Well developed 49 year old ale in no acute distress. Head: Normocephalic and atraumatic. Eyes: No scleral icterus. Conjunctiva pink . Ears: Normal auditory acuity. Mouth: Dentition intact. No ulcers or lesions.  Lungs: Clear throughout to auscultation. Heart: Regular rate and rhythm, no murmur. Abdomen: Soft, nontender and nondistended. No masses or hepatomegaly. Normal bowel sounds x 4 quadrants.  Rectal: Deferred,  Musculoskeletal: Symmetrical with no gross deformities. Extremities: No edema. Neurological: Alert oriented x 4. No focal deficits.  Psychological: Alert and cooperative. Normal mood and affect  Assessment and Recommendations:  57. 49 year old male with alternating constipation and diarrhea.  -CBC, CRP, CMP, tTg and IgA -Diagnostic colonoscopy, rule out microscopic colitis, less likely IBD -To check stool cultures if diarrhea occurs daily -Pancreatic elastase level  -Bacteria probiotic of choice once daily -Benefiber 1 tablespoon once daily if tolerated -Lactaid 1 to 2 tabs with each dairy product  -Patient to call our office if his symptoms worsen   2. History of PE/DVT without right heart strain  08/2019, antiphospholipid antibody syndrome, protein S deficiency  -Our office will contact the patient's hematologist to verify Lovenox instructions prior to colonoscopy   3.  Family history of colon cancer (grandfather). Family history of colon polyps (father)  24. History of GERD, nutcracker esophagus. Asymptomatic on PPI bid.

## 2020-05-21 ENCOUNTER — Other Ambulatory Visit (INDEPENDENT_AMBULATORY_CARE_PROVIDER_SITE_OTHER): Payer: BC Managed Care – PPO

## 2020-05-21 ENCOUNTER — Ambulatory Visit: Payer: BC Managed Care – PPO | Admitting: Nurse Practitioner

## 2020-05-21 ENCOUNTER — Telehealth: Payer: Self-pay | Admitting: General Surgery

## 2020-05-21 ENCOUNTER — Encounter: Payer: Self-pay | Admitting: Nurse Practitioner

## 2020-05-21 VITALS — BP 124/82 | HR 104 | Ht 73.0 in | Wt 285.0 lb

## 2020-05-21 DIAGNOSIS — Z800 Family history of malignant neoplasm of digestive organs: Secondary | ICD-10-CM

## 2020-05-21 DIAGNOSIS — K529 Noninfective gastroenteritis and colitis, unspecified: Secondary | ICD-10-CM | POA: Insufficient documentation

## 2020-05-21 DIAGNOSIS — Z86718 Personal history of other venous thrombosis and embolism: Secondary | ICD-10-CM

## 2020-05-21 DIAGNOSIS — K5900 Constipation, unspecified: Secondary | ICD-10-CM

## 2020-05-21 DIAGNOSIS — R197 Diarrhea, unspecified: Secondary | ICD-10-CM | POA: Insufficient documentation

## 2020-05-21 HISTORY — DX: Diarrhea, unspecified: R19.7

## 2020-05-21 LAB — COMPREHENSIVE METABOLIC PANEL
ALT: 84 U/L — ABNORMAL HIGH (ref 0–53)
AST: 39 U/L — ABNORMAL HIGH (ref 0–37)
Albumin: 4.5 g/dL (ref 3.5–5.2)
Alkaline Phosphatase: 50 U/L (ref 39–117)
BUN: 16 mg/dL (ref 6–23)
CO2: 30 mEq/L (ref 19–32)
Calcium: 10.1 mg/dL (ref 8.4–10.5)
Chloride: 100 mEq/L (ref 96–112)
Creatinine, Ser: 1.26 mg/dL (ref 0.40–1.50)
GFR: 61.05 mL/min (ref >60.00)
Glucose, Bld: 84 mg/dL (ref 70–99)
Potassium: 4.2 mEq/L (ref 3.5–5.1)
Sodium: 135 mEq/L (ref 135–145)
Total Bilirubin: 0.6 mg/dL (ref 0.2–1.2)
Total Protein: 8.3 g/dL (ref 6.0–8.3)

## 2020-05-21 LAB — CBC WITH DIFFERENTIAL/PLATELET
Basophils Absolute: 0.1 10*3/uL (ref 0.0–0.1)
Basophils Relative: 0.8 % (ref 0.0–3.0)
Eosinophils Absolute: 0.2 10*3/uL (ref 0.0–0.7)
Eosinophils Relative: 2.8 % (ref 0.0–5.0)
HCT: 45.5 % (ref 39.0–52.0)
Hemoglobin: 16 g/dL (ref 13.0–17.0)
Lymphocytes Relative: 22.6 % (ref 12.0–46.0)
Lymphs Abs: 1.5 10*3/uL (ref 0.7–4.0)
MCHC: 35.1 g/dL (ref 30.0–36.0)
MCV: 88 fl (ref 78.0–100.0)
Monocytes Absolute: 0.6 10*3/uL (ref 0.1–1.0)
Monocytes Relative: 8.6 % (ref 3.0–12.0)
Neutro Abs: 4.5 10*3/uL (ref 1.4–7.7)
Neutrophils Relative %: 65.2 % (ref 43.0–77.0)
Platelets: 264 10*3/uL (ref 150.0–400.0)
RBC: 5.17 Mil/uL (ref 4.22–5.81)
RDW: 13.6 % (ref 11.5–15.5)
WBC: 6.8 10*3/uL (ref 4.0–10.5)

## 2020-05-21 LAB — C-REACTIVE PROTEIN: CRP: <1 mg/dL (ref 0.5–20.0)

## 2020-05-21 MED ORDER — CLENPIQ 10-3.5-12 MG-GM -GM/160ML PO SOLN
1.0000 | ORAL | 0 refills | Status: DC
Start: 2020-05-21 — End: 2020-06-26

## 2020-05-21 NOTE — Telephone Encounter (Signed)
[facility] Pre-operative Risk Assessment     Request for surgical clearance:     Endoscopy Procedure  What type of surgery is being performed?     Colonoscopy  When is this surgery scheduled?     06/20/2020  What type of clearance is required ?   Pharmacy  Are there any medications that need to be held prior to surgery and how long? Lovenox 1-2 days before procedure  Practice name and name of physician performing surgery?      [facility]  What is your office phone and fax number?      Phone- [phone]  [phone]  Anesthesia type (None, local, MAC, general) ?       MAC

## 2020-05-21 NOTE — Patient Instructions (Signed)
If you are age 48 or older, your body mass index should be between 23-30. Your Body mass index is 37.6 kg/m. If this is out of the aforementioned range listed, please consider follow up with your Primary Care Provider.  If you are age 87 or younger, your body mass index should be between 19-25. Your Body mass index is 37.6 kg/m. If this is out of the aformentioned range listed, please consider follow up with your Primary Care Provider.   Your provider has requested that you go to the basement level for lab work before leaving today. Press "B" on the elevator. The lab is located at the first door on the left as you exit the elevator.  We have sent the following medications to your pharmacy for you to pick up at your convenience: Clenpiq- for colonoscopy  Probiotic of choice once daily Benefiber 1 tbsp daily as tolerated Lactaid 1-2 tabs with each dairy product Call our office if symptoms get worse.  You will be contaced by our office prior to your procedure for directions on holding your Lovenox.  If you do not hear from our office 1 week prior to your scheduled procedure, please call [phone] to discuss.  Due to recent changes in healthcare laws, you may see the results of your imaging and laboratory studies on MyChart before your provider has had a chance to review them.  We understand that in some cases there may be results that are confusing or concerning to you. Not all laboratory results come back in the same time frame and the provider may be waiting for multiple results in order to interpret others.  Please give [place] 48 hours in order for your provider to thoroughly review all the results before contacting the office for clarification of your results.

## 2020-05-22 LAB — TISSUE TRANSGLUTAMINASE, IGA: (tTG) Ab, IgA: 1 U/mL

## 2020-05-22 NOTE — Telephone Encounter (Signed)
Left a message on voicemail for the patient to contact the office regarding Lovenox

## 2020-05-22 NOTE — Telephone Encounter (Signed)
[place], Holli Humbles, NP  Letta Pate, CMA For some reason I was not able to response directly to the quick note you sent on medical clearance for this patient. I spoke with Dr. Marin Olp and he would like the patient to stop his Lovenox one day before the procedure and restart the day after. Thank you and please let us know if you have any other questions or concerns!    [name], NP

## 2020-05-23 LAB — CELIAC PANEL 10
Antigliadin Abs, IgA: 8 units (ref 0–19)
Endomysial IgA: NEGATIVE
Gliadin IgG: 3 units (ref 0–19)
IgA/Immunoglobulin A, Serum: 480 mg/dL — ABNORMAL HIGH (ref 90–386)
Tissue Transglut Ab: 4 U/mL (ref 0–5)
Transglutaminase IgA: <2 U/mL (ref 0–3)

## 2020-05-23 NOTE — Progress Notes (Signed)
____________________________________________________________  Attending physician addendum:  Thank you for sending this case to me. I have reviewed the entire note, and the outlined plan seems appropriate.  Of note, hematology has replied to the question of pre-procedure Lovenox instructions. Last dose AM of the day before procedure.  Wilfrid Lund, MD  ____________________________________________________________

## 2020-05-29 ENCOUNTER — Telehealth: Payer: Self-pay | Admitting: *Deleted

## 2020-05-29 ENCOUNTER — Encounter: Payer: Self-pay | Admitting: Family

## 2020-05-29 NOTE — Telephone Encounter (Signed)
Left message for the patient to contact the office regarding Lovenox.

## 2020-05-29 NOTE — Telephone Encounter (Signed)
Notified the patient that his Dr stated he needed to stop his Lovenox the day before his colonoscopy. The patient verbalized understanding.

## 2020-05-29 NOTE — Telephone Encounter (Signed)
-----   Message from Noralyn Pick, NP sent at 05/29/2020  7:30 AM EDT ----- Olivia Mackie, please contact patient to verify he received your message regarding his Lovenox instruction prior to his procedure date. Thx  ----- Message ----- From: Doran Stabler, MD Sent: 05/23/2020   4:16 PM EDT To: Noralyn Pick, NP    ----- Message ----- From: Noralyn Pick, NP Sent: 05/21/2020   5:05 PM EDT To: Doran Stabler, MD

## 2020-05-29 NOTE — Telephone Encounter (Signed)
Call placed to patient regarding MyChart message re: cramping and chest pain.  Pt states that he is taking lovenox BID.  Pt instructed per order of S. [place] NP to go to the [place] now.  Pt states that he will go to the [facility] ER now and is appreciative of call.

## 2020-05-29 NOTE — Telephone Encounter (Signed)
Pt called returning your call and states he is working all day to call anytime and he will try to answer.

## 2020-05-30 ENCOUNTER — Emergency Department (HOSPITAL_COMMUNITY): Payer: BC Managed Care – PPO

## 2020-05-30 ENCOUNTER — Ambulatory Visit (HOSPITAL_BASED_OUTPATIENT_CLINIC_OR_DEPARTMENT_OTHER)
Admit: 2020-05-30 | Discharge: 2020-05-30 | Disposition: A | Discharge status: Home / Self Care | Payer: BC Managed Care – PPO | Attending: Emergency Medicine | Admitting: Emergency Medicine

## 2020-05-30 ENCOUNTER — Other Ambulatory Visit: Payer: Self-pay | Admitting: Family

## 2020-05-30 ENCOUNTER — Encounter (HOSPITAL_COMMUNITY): Payer: Self-pay | Admitting: Emergency Medicine

## 2020-05-30 ENCOUNTER — Emergency Department (HOSPITAL_COMMUNITY)
Admission: EM | Admit: 2020-05-30 | Discharge: 2020-05-30 | Disposition: A | Discharge status: Home / Self Care | Payer: BC Managed Care – PPO | Attending: Emergency Medicine | Admitting: Emergency Medicine

## 2020-05-30 DIAGNOSIS — R52 Pain, unspecified: Secondary | ICD-10-CM

## 2020-05-30 DIAGNOSIS — J189 Pneumonia, unspecified organism: Secondary | ICD-10-CM | POA: Insufficient documentation

## 2020-05-30 DIAGNOSIS — R0789 Other chest pain: Secondary | ICD-10-CM

## 2020-05-30 DIAGNOSIS — R252 Cramp and spasm: Secondary | ICD-10-CM | POA: Insufficient documentation

## 2020-05-30 DIAGNOSIS — Z20822 Contact with and (suspected) exposure to covid-19: Secondary | ICD-10-CM | POA: Diagnosis not present

## 2020-05-30 DIAGNOSIS — R079 Chest pain, unspecified: Secondary | ICD-10-CM | POA: Diagnosis not present

## 2020-05-30 DIAGNOSIS — I2510 Atherosclerotic heart disease of native coronary artery without angina pectoris: Secondary | ICD-10-CM | POA: Diagnosis not present

## 2020-05-30 DIAGNOSIS — Z87891 Personal history of nicotine dependence: Secondary | ICD-10-CM | POA: Diagnosis not present

## 2020-05-30 DIAGNOSIS — Z79899 Other long term (current) drug therapy: Secondary | ICD-10-CM | POA: Diagnosis not present

## 2020-05-30 DIAGNOSIS — I10 Essential (primary) hypertension: Secondary | ICD-10-CM | POA: Insufficient documentation

## 2020-05-30 DIAGNOSIS — R0602 Shortness of breath: Secondary | ICD-10-CM | POA: Diagnosis not present

## 2020-05-30 DIAGNOSIS — Z7901 Long term (current) use of anticoagulants: Secondary | ICD-10-CM | POA: Diagnosis not present

## 2020-05-30 DIAGNOSIS — I2699 Other pulmonary embolism without acute cor pulmonale: Secondary | ICD-10-CM | POA: Diagnosis not present

## 2020-05-30 DIAGNOSIS — R918 Other nonspecific abnormal finding of lung field: Secondary | ICD-10-CM | POA: Diagnosis not present

## 2020-05-30 DIAGNOSIS — I517 Cardiomegaly: Secondary | ICD-10-CM | POA: Diagnosis not present

## 2020-05-30 LAB — BASIC METABOLIC PANEL
Anion gap: 13 (ref 5–15)
BUN: 18 mg/dL (ref 6–20)
CO2: 28 mmol/L (ref 22–32)
Calcium: 9.2 mg/dL (ref 8.9–10.3)
Chloride: 98 mmol/L (ref 98–111)
Creatinine, Ser: 1.27 mg/dL — ABNORMAL HIGH (ref 0.61–1.24)
GFR calc Af Amer: >60 mL/min (ref >60)
GFR calc non Af Amer: >60 mL/min (ref >60)
Glucose, Bld: 74 mg/dL (ref 70–99)
Potassium: 4.1 mmol/L (ref 3.5–5.1)
Sodium: 139 mmol/L (ref 135–145)

## 2020-05-30 LAB — CBC
HCT: 45.6 % (ref 39.0–52.0)
Hemoglobin: 15.5 g/dL (ref 13.0–17.0)
MCH: 30.5 pg (ref 26.0–34.0)
MCHC: 34 g/dL (ref 30.0–36.0)
MCV: 89.6 fL (ref 80.0–100.0)
Platelets: 244 10*3/uL (ref 150–400)
RBC: 5.09 MIL/uL (ref 4.22–5.81)
RDW: 12.9 % (ref 11.5–15.5)
WBC: 7.7 10*3/uL (ref 4.0–10.5)
nRBC: 0 % (ref 0.0–0.2)

## 2020-05-30 LAB — CK: Total CK: 139 U/L (ref 49–397)

## 2020-05-30 LAB — TROPONIN I (HIGH SENSITIVITY)
Troponin I (High Sensitivity): 5 ng/L (ref <18)
Troponin I (High Sensitivity): 6 ng/L (ref <18)

## 2020-05-30 LAB — SARS CORONAVIRUS 2 BY RT PCR (HOSPITAL ORDER, PERFORMED IN ~~LOC~~ HOSPITAL LAB): SARS Coronavirus 2: NEGATIVE

## 2020-05-30 MED ORDER — IOHEXOL 350 MG/ML SOLN
100.0000 mL | Freq: Once | INTRAVENOUS | Status: AC | PRN
  Administered 2020-05-30: 100 mL via INTRAVENOUS

## 2020-05-30 MED ORDER — DOXYCYCLINE HYCLATE 100 MG PO CAPS
100.0000 mg | ORAL_CAPSULE | Freq: Two times a day (BID) | ORAL | 0 refills | Status: DC
Start: 2020-05-30 — End: 2020-06-18

## 2020-05-30 NOTE — ED Notes (Signed)
ED Provider at bedside. 

## 2020-05-30 NOTE — ED Notes (Signed)
Drew a blue top and sent to lab

## 2020-05-30 NOTE — Progress Notes (Signed)
Bilateral lower extremity venous duplex has been completed. Preliminary results can be found in CV Proc through chart review.  Results were given to Dr. Ralene Bathe.  05/30/20 7:12 PM Wesley Hancock RVT

## 2020-05-30 NOTE — ED Provider Notes (Signed)
[facility] DEPT Provider Note   CSN: [id number] Arrival date & time: 05/30/20  1305     History Chief Complaint  Patient presents with  . Chest Pain    Wesley Hancock is a 49 y.o. male.  The history is provided by the patient and medical records. No language interpreter was used.   Wesley Hancock is a 49 y.o. male who presents to the Emergency Department complaining of chest pain. He presents the emergency department complaining of chest pain and leg cramping. He has a history of antiphospholipid syndrome and is currently on Lovenox BID. He had bilateral pulmonary embolism last year. A few days ago he developed cramping throughout bilateral lower extremities, worse when he is sitting. He also developed some left sided chest pressure that was walking when he yesterday, worse with activity. Chest pressure is currently resolved. 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 He denies any fevers, cough, nausea, vomiting, abdominal pain. He has chronic shortness of breath and this is at his baseline. He called his hematologist, who recommend evaluation in the emergency department due to his history of pulmonary embolism.    Past Medical History:  Diagnosis Date  . Anxiety   . APS (antiphospholipid syndrome) ([place])   . Arthritis   . Colon polyp    ? hyperplastic  . Depression   . DVT (deep venous thrombosis) ([place])   . Esophageal spasm   . HTN (hypertension)   . Hyperlipidemia   . Nutcracker esophagus   . Pneumonia   . Pulmonary embolism [facility])     Patient Active Problem List   Diagnosis Date Noted  . Diarrhea 05/21/2020  . Upper airway cough syndrome 01/05/2020  . Paresthesia of right foot 11/08/2019  . Antiphospholipid syndrome ([place]) 10/30/2019  . Pulmonary infiltrates 09/10/2019  . Morbid obesity due to excess calories ([place]) c/b hbp/ dvt/PE 09/10/2019  . Acute nonintractable headache  09/06/2019  . Lobar pneumonia ([place]) 08/30/2019  . Acute respiratory failure with hypoxia ([place]) 08/30/2019  . AKI (acute kidney injury) ([place]) 08/30/2019  . DVT (deep venous thrombosis) ([place]) 08/30/2019  . Multiple pulmonary emboli ([place]) 08/22/2019  . Dyspnea on exertion 09/08/2017  . Depression 06/24/2017  . GAD (generalized anxiety disorder) 08/06/2016  . Mixed hyperlipidemia 09/18/2015  . Essential hypertension 09/23/2014  . Nutcracker esophagus 09/21/2014  . Lung nodule 09/21/2014    Past Surgical History:  Procedure Laterality Date  . CHOLECYSTECTOMY         Family History  Problem Relation Age of Onset  . Diabetes Mother   . Hyperlipidemia Mother   . Hypertension Mother   . Heart disease Father 54  . Hyperlipidemia Father   . Hypertension Father   . Prostate cancer Father 50  . Lung cancer Father 78       Dx 06/18/2017  . Colon polyps Father   . Irritable bowel syndrome Father   . Diverticulitis Father   . Diabetes Sister   . Hyperlipidemia Sister   . Hypertension Sister   . Diabetes Maternal Grandmother   . Heart disease Maternal Grandmother   . Hyperlipidemia Maternal Grandmother   . Hypertension Maternal Grandmother   . Colon cancer Maternal Grandmother   . Heart disease Maternal Grandfather   . Hyperlipidemia Maternal Grandfather   . Hypertension Maternal Grandfather   . Stroke Maternal Grandfather   . Liver cancer Maternal Grandfather   . Irritable bowel syndrome Maternal Grandfather   .  Heart disease Paternal Grandmother     Social History   Tobacco Use  . Smoking status: Former Smoker    Packs/day: 1.00    Years: 20.00    Pack years: 20.00    Types: Cigarettes    Quit date: 11/30/2013    Years since quitting: 6.5  . Smokeless tobacco: Former Systems developer    Types: Snuff    Quit date: 05/21/2018  Vaping Use  . Vaping Use: Never used  Substance Use Topics  . Alcohol use: Yes    Alcohol/week: 5.0 standard drinks    Types: 5 Cans of beer per week     Comment: 1 per day  . Drug use: No    Home Medications Prior to Admission medications   Medication Sig Start Date End Date Taking? Authorizing Provider  acetaminophen (TYLENOL) 500 MG tablet Take 1,000 mg by mouth every 6 (six) hours as needed for mild pain.    [provider]  atorvastatin (LIPITOR) 10 MG tablet Take 1 tablet (10 mg total) by mouth daily. 06/19/19   Inda Coke, PA  doxycycline (VIBRAMYCIN) 100 MG capsule Take 1 capsule (100 mg total) by mouth 2 (two) times daily. 05/30/20   Quintella Reichert, MD  enoxaparin (LOVENOX) 120 MG/0.8ML injection Inject 0.8 mLs (120 mg total) into the skin every 12 (twelve) hours. 03/01/20   [place], Holli Humbles, NP  escitalopram (LEXAPRO) 20 MG tablet Take 1 tablet (20 mg total) by mouth daily. 10/30/19   Inda Coke, PA  fluticasone (FLONASE) 50 MCG/ACT nasal spray Place 1 spray into both nostrils daily.     [provider]  nitroGLYCERIN (NITROSTAT) 0.4 MG SL tablet Place 1 tablet (0.4 mg total) under the tongue every 5 (five) minutes as needed for chest pain. Patient not taking: Reported on 05/21/2020 03/07/18   Inda Coke, PA  pantoprazole (PROTONIX) 40 MG tablet Take 1 tablet (40 mg total) by mouth 2 (two) times daily. 03/08/20   [place], Fonnie Mu, NP  Sod Picosulfate-Mag Ox-Cit Acd (CLENPIQ) 10-3.5-12 MG-GM -GM/160ML SOLN Take 1 kit by mouth as directed. 05/21/20   Noralyn Pick, NP  telmisartan-hydrochlorothiazide (MICARDIS HCT) 80-25 MG tablet Take 1 tablet by mouth daily. 10/30/19   Inda Coke, PA    Allergies    Diltiazem hcl  Review of Systems   Review of Systems  All other systems reviewed and are negative.   Physical Exam Updated Vital Signs BP (!) 147/82 (BP Location: Right Arm)   Pulse 76   Temp 97.9 F (36.6 C) (Oral)   Resp 20   SpO2 97%   Physical Exam Vitals and nursing note reviewed.  Constitutional:      Appearance: He is well-developed.  HENT:     Head: Normocephalic and  atraumatic.  Cardiovascular:     Rate and Rhythm: Normal rate and regular rhythm.     Heart sounds: No murmur heard.   Pulmonary:     Effort: Pulmonary effort is normal. No respiratory distress.     Breath sounds: Normal breath sounds.  Abdominal:     Palpations: Abdomen is soft.     Tenderness: There is no abdominal tenderness. There is no guarding or rebound.  Musculoskeletal:        General: No swelling or tenderness.  Skin:    General: Skin is warm and dry.  Neurological:     Mental Status: He is alert and oriented to person, place, and time.  Psychiatric:        Behavior:  Behavior normal.     ED Results / Procedures / Treatments   Labs (all labs ordered are listed, but only abnormal results are displayed) Labs Reviewed  BASIC METABOLIC PANEL - Abnormal; Notable for the following components:      Result Value   Creatinine, Ser 1.27 (*)    All other components within normal limits  SARS CORONAVIRUS 2 BY RT PCR (HOSPITAL ORDER, [facility])  CBC  CK  TROPONIN I (HIGH SENSITIVITY)  TROPONIN I (HIGH SENSITIVITY)    EKG None ED ECG REPORT   Date: 05/30/2020  Rate: 81  Rhythm: normal sinus rhythm  QRS Axis: normal  Intervals: normal  ST/T Wave abnormalities: normal  Conduction Disutrbances:none  Narrative Interpretation:   Old EKG Reviewed: unchanged  I have personally reviewed the EKG tracing and agree with the computerized printout as noted.  Radiology DG Chest 2 View  Result Date: 05/30/2020 CLINICAL DATA:  Chest pain. EXAM: CHEST - 2 VIEW COMPARISON:  March 08, 2020. FINDINGS: The heart size and mediastinal contours are within normal limits. Both lungs are clear. The visualized skeletal structures are unremarkable. IMPRESSION: No active cardiopulmonary disease. Electronically Signed   By: Marijo Conception M.D.   On: 05/30/2020 14:21   CT Angio Chest PE W/Cm &/Or Wo Cm  Result Date: 05/30/2020 CLINICAL DATA:  Chest pain EXAM: CT  ANGIOGRAPHY CHEST WITH CONTRAST TECHNIQUE: Multidetector CT imaging of the chest was performed using the standard protocol during bolus administration of intravenous contrast. Multiplanar CT image reconstructions and MIPs were obtained to evaluate the vascular anatomy. CONTRAST:  126m OMNIPAQUE IOHEXOL 350 MG/ML SOLN COMPARISON:  08/30/2019 FINDINGS: Cardiovascular: No filling defects in the pulmonary arteries to suggest pulmonary emboli. Previously seen pulmonary emboli have resolved. Scattered coronary artery calcifications. Heart is normal size. Aorta is normal caliber. Mediastinum/Nodes: Mildly prominent bilateral hilar and mediastinal lymph nodes are stable. Trachea and esophagus are unremarkable. Thyroid unremarkable. Lungs/Pleura: Bilateral ground-glass airspace opacities, somewhat more confluent in the lower lobes concerning for pneumonia. No effusions. Upper Abdomen: Diffuse fatty infiltration of the liver. No acute findings. Musculoskeletal: Chest wall soft tissues are unremarkable. No acute bony abnormality. Review of the MIP images confirms the above findings. IMPRESSION: No evidence of pulmonary embolus. Previously seen pulmonary emboli have resolved. Patchy scattered ground-glass airspace opacities, most pronounced in the lower lobes concerning for pneumonia. Cardiomegaly. Borderline sized bilateral hilar and mediastinal lymph nodes are stable since prior study, likely reactive. Coronary artery disease. Fatty infiltration of liver. Electronically Signed   By: KRolm BaptiseM.D.   On: 05/30/2020 18:18    Procedures Procedures (including critical care time)  Medications Ordered in ED Medications  iohexol (OMNIPAQUE) 350 MG/ML injection 100 mL (100 mLs Intravenous Contrast Given 05/30/20 1757)    ED Course  I have reviewed the triage vital signs and the nursing notes.  Pertinent labs & imaging results that were available during my care of the patient were reviewed by me and considered in my  medical decision making (see chart for details).    MDM Rules/Calculators/A&P                         Patient with history of PE here for evaluation of bilateral lower extremity pain, transient chest pain. EKG without acute ischemic changes. Vascular ultrasound and CTA are negative for thrombus. CT does demonstrate bilateral pneumonia. Patient without current respiratory symptoms, unclear if this is residual change from his prior  PE or if this is a new pneumonia. Will treat with doxycycline for possible recurrent pneumonia. Will send COVID 19 swab although patient is low risk for COVID-19 at this time as he has been fully vaccinated and has no known sick contacts. Discussed outpatient follow-up, home care and return precautions.  Final Clinical Impression(s) / ED Diagnoses Final diagnoses:  Body aches  Atypical chest pain  Community acquired pneumonia, unspecified laterality    Rx / DC Orders ED Discharge Orders         Ordered    doxycycline (VIBRAMYCIN) 100 MG capsule  2 times daily     Discontinue  Reprint     05/30/20 1841           Quintella Reichert, MD 05/30/20 1858

## 2020-05-30 NOTE — ED Triage Notes (Signed)
Per pt, states his hematologist recommended he come to ED due to cramping in legs, back and chest pain-history of DVT's

## 2020-05-31 NOTE — Progress Notes (Signed)
[name], pls call the patient and schedule a follow up appointment in the office within the next 2 weeks. I saw in epic records that he went to the ER  05/30/2020 with chest pain and he was treated for possible pneumonia. His LFTs are elevated. Covid 19 test was negative. LE doppler to rule out DVT was done yesterday, results pending. He is scheduled for a colonoscopy with Dr. Loletha Carrow on 7/22 but this procedure might need to be rescheduled for a later date. Please let me know when follow up appt scheduled.   Also, patient will need labs done in one week. Pls enter lab orders for: Hepatic panel, acute hepatitis panel, Hep B core total, Hep B surface antibody, Hep A ab total, ceruloplasmin, alpha 1 antitrypsin, ANA, SMA, AMA, IgG, Iron, ferritin and GGT.  Let patient know when lab order done.   Thank you.  (He will need an abdominal sonogram but I will order that at the time of his follow up appointment).

## 2020-06-06 ENCOUNTER — Other Ambulatory Visit: Payer: Self-pay

## 2020-06-06 DIAGNOSIS — R7989 Other specified abnormal findings of blood chemistry: Secondary | ICD-10-CM

## 2020-06-07 ENCOUNTER — Encounter: Payer: Self-pay | Admitting: Gastroenterology

## 2020-06-11 ENCOUNTER — Telehealth: Payer: Self-pay | Admitting: Nurse Practitioner

## 2020-06-11 NOTE — Telephone Encounter (Signed)
Patient was in the ED on 7/1 with cp. He was diagnosed with pneumonia.   [name] please cancel his 06/20/2020 colonoscopy with Dr. Loletha Carrow. Please schedule patient for a follow up appointment office  early August. He has a complex past medical hx and with his recent pneumonia he should be re-examined prior to rescheduling a colonoscopy.  Dr. Loletha Carrow, if his pneumonia is resolving when should his colonoscopy be scheduled?

## 2020-06-11 NOTE — Telephone Encounter (Signed)
Called the patient's number. No answer. Left a message to  Return my call. New appointment scheduled for an August office visit with Dr Loletha Carrow.

## 2020-06-14 NOTE — Telephone Encounter (Signed)
Left a message about the appointment. Asked the patient call and confirm. 06/20/20 Colon cancelled due to pneumonia 07/18/20 at 8:20 am office appointment with Dr Loletha Carrow

## 2020-06-17 NOTE — Telephone Encounter (Signed)
Spoke to pt told him Aldona Bar would like you to schedule an appt to discuss cardiac issues per GI and refer you to Cardiology. Pt verbalized understanding. Appt scheduled for tomorrow 06/18/2020 at 7:30 AM.

## 2020-06-17 NOTE — Telephone Encounter (Signed)
[name] and [name],   Yes, a clinic visit with me next month is appropriate. There are medical issues to be readdressed before a colonoscopy.  ___________________________________  Ms. Wesley Hancock,    This is a primary care patient of yours whom we saw in clinic recently.  Please see records from recent ED visit for chest pain.  Dx with pneumonia (seems subtle finding on CT angiogram chest), but that same study also noted CAD and cardiomegaly. I do not see any [facility] cardiology evaluation recently. Perhaps there is more cardiac history or outside records you are aware of on this patient.  If not, a Cardiology referral would seem to be in order.  Thanks very much.  Wilfrid Lund, MD [facility]

## 2020-06-17 NOTE — Telephone Encounter (Signed)
Called the patient again. No answer. Voicemail. Left a message asking he call and confirm the appointment scheduled. His colonoscopy was cancelled.

## 2020-06-17 NOTE — Telephone Encounter (Signed)
Please call patient and see if he can come in to the office to discuss concerns about ongoing cardiac symptoms and findings on recent CT. Although he was referred to cardiology in 2018, it does not appear that the appointment was ever scheduled.  Both Dr. Loletha Carrow (his GI physician) and myself agree that cardiac work-up is warranted given the symptoms he is having and recent findings.  Aldona Bar

## 2020-06-18 ENCOUNTER — Ambulatory Visit: Payer: BC Managed Care – PPO | Admitting: Physician Assistant

## 2020-06-18 ENCOUNTER — Encounter: Payer: Self-pay | Admitting: Physician Assistant

## 2020-06-18 ENCOUNTER — Other Ambulatory Visit: Payer: Self-pay

## 2020-06-18 VITALS — BP 120/84 | HR 96 | Temp 97.2°F | Ht 73.0 in | Wt 285.0 lb

## 2020-06-18 DIAGNOSIS — R0609 Other forms of dyspnea: Secondary | ICD-10-CM

## 2020-06-18 DIAGNOSIS — R0600 Dyspnea, unspecified: Secondary | ICD-10-CM

## 2020-06-18 DIAGNOSIS — R21 Rash and other nonspecific skin eruption: Secondary | ICD-10-CM

## 2020-06-18 DIAGNOSIS — L918 Other hypertrophic disorders of the skin: Secondary | ICD-10-CM | POA: Diagnosis not present

## 2020-06-18 MED ORDER — KETOCONAZOLE 2 % EX CREA: TOPICAL_CREAM | CUTANEOUS | 0 refills | Status: DC

## 2020-06-18 NOTE — Patient Instructions (Addendum)
It was great to see you!  Ointment has been sent in for your rash.  Cardiology will contact you about appointment  I will reach out to Wesley Hancock' office about your labs.  Biopsy, Surgery (Curettage) & Surgery (Excision) Aftercare Instructions   1. Okay to remove bandage in 24 hours   2. Wash area with soap and water   3. Apply Vaseline to area twice daily until healed (Not Neosporin)   4. Okay to cover with a Band-Aid to decrease the chance of infection or prevent irritation from clothing; also it's okay to uncover lesion at home.   5. Suture instructions: return to our office in 7-10 or 10-14 days for a nurse visit for suture removal. Variable healing with sutures, if pain or itching occurs call our office. It's okay to shower or bathe 24 hours after sutures are given.   6. The following risks may occur after a biopsy, curettage or excision: bleeding, scarring, discoloration, recurrence, infection (redness, yellow drainage, pain or swelling).   7. For questions, concerns and results call our office at [place] before 4pm & Friday before 3pm. Biopsy results will be available in 1 week.    Take care,  Inda Coke PA-C

## 2020-06-18 NOTE — Progress Notes (Signed)
Wesley Hancock is a 49 y.o. male is here to discuss cardiology referral.  I acted as a Education administrator for [employer], PA-C Anselmo Pickler, LPN   History of Present Illness:   Chief Complaint  Patient presents with  . Discuss referral    HPI   Shortness of breath Pt here today to discuss referral to Cardiology. He has been seeing Dr. Melvyn Novas and Lynelle Smoke Parrett regularly for Upper Airway Cough Syndrome, multiple pulmonary emboli, pulmonary infiltrates and lobar PNA. He is maintained on albuterol inhaler prn. He is also followed by Hematology and uses Lovenox regularly.  Saw cardiology in 2016: Nuclear Medicine Stress Test 09/20/2015 --> normal Lexiscan Cardiolite 09/20/2015 --> no sx suggestive of ischemia  Had a CT scan of chest earlier this month and was told he had ongoing PNA. But also was found to have CAD and cardiomegaly. With little activity has ongoing DOE and fatigue.   Skin lesions Getting brown raised spots on his abdomen and chest. Has tried over the counter cortisone cream. Starting to have itching at night and is also undergoing work-up for elevated liver enzymes.  Skin tag Patient has skin tag in his inner left groin and it has been causing pain and has been bleeding at times.  He would like this removed.   Health Maintenance Due  Topic Date Due  . Hepatitis C Screening  Never done    Past Medical History:  Diagnosis Date  . Anxiety   . APS (antiphospholipid syndrome) ([place])   . Arthritis   . Colon polyp    ? hyperplastic  . Depression   . DVT (deep venous thrombosis) ([place])   . Esophageal spasm   . HTN (hypertension)   . Hyperlipidemia   . Nutcracker esophagus   . Pneumonia   . Pulmonary embolism ([name])      Social History   Tobacco Use  . Smoking status: Former Smoker    Packs/day: 1.00    Years: 20.00    Pack years: 20.00    Types: Cigarettes    Quit date: 11/30/2013    Years since quitting: 6.5  . Smokeless tobacco: Former Systems developer    Types:  Snuff    Quit date: 05/21/2018  Vaping Use  . Vaping Use: Never used  Substance Use Topics  . Alcohol use: Yes    Alcohol/week: 5.0 standard drinks    Types: 5 Cans of beer per week    Comment: 1 per day  . Drug use: No    Past Surgical History:  Procedure Laterality Date  . CHOLECYSTECTOMY      Family History  Problem Relation Age of Onset  . Diabetes Mother   . Hyperlipidemia Mother   . Hypertension Mother   . Heart disease Father 58  . Hyperlipidemia Father   . Hypertension Father   . Prostate cancer Father 53  . Lung cancer Father 40       Dx 06/18/2017  . Colon polyps Father   . Irritable bowel syndrome Father   . Diverticulitis Father   . Diabetes Sister   . Hyperlipidemia Sister   . Hypertension Sister   . Diabetes Maternal Grandmother   . Heart disease Maternal Grandmother   . Hyperlipidemia Maternal Grandmother   . Hypertension Maternal Grandmother   . Colon cancer Maternal Grandmother   . Heart disease Maternal Grandfather   . Hyperlipidemia Maternal Grandfather   . Hypertension Maternal Grandfather   . Stroke Maternal Grandfather   . Liver cancer Maternal  Grandfather   . Irritable bowel syndrome Maternal Grandfather   . Heart disease Paternal Grandmother     PMHx, SurgHx, SocialHx, FamHx, Medications, and Allergies were reviewed in the Visit Navigator and updated as appropriate.   Patient Active Problem List   Diagnosis Date Noted  . Diarrhea 05/21/2020  . Upper airway cough syndrome 01/05/2020  . Paresthesia of right foot 11/08/2019  . Antiphospholipid syndrome ([place]) 10/30/2019  . Pulmonary infiltrates 09/10/2019  . Morbid obesity due to excess calories ([place]) c/b hbp/ dvt/PE 09/10/2019  . Acute nonintractable headache 09/06/2019  . Lobar pneumonia ([place]) 08/30/2019  . Acute respiratory failure with hypoxia ([place]) 08/30/2019  . AKI (acute kidney injury) ([place]) 08/30/2019  . DVT (deep venous thrombosis) ([place]) 08/30/2019  . Multiple pulmonary emboli  ([place]) 08/22/2019  . Dyspnea on exertion 09/08/2017  . Depression 06/24/2017  . GAD (generalized anxiety disorder) 08/06/2016  . Mixed hyperlipidemia 09/18/2015  . Essential hypertension 09/23/2014  . Nutcracker esophagus 09/21/2014  . Lung nodule 09/21/2014    Social History   Tobacco Use  . Smoking status: Former Smoker    Packs/day: 1.00    Years: 20.00    Pack years: 20.00    Types: Cigarettes    Quit date: 11/30/2013    Years since quitting: 6.5  . Smokeless tobacco: Former Systems developer    Types: Snuff    Quit date: 05/21/2018  Vaping Use  . Vaping Use: Never used  Substance Use Topics  . Alcohol use: Yes    Alcohol/week: 5.0 standard drinks    Types: 5 Cans of beer per week    Comment: 1 per day  . Drug use: No    Current Medications and Allergies:    Current Outpatient Medications:  .  acetaminophen (TYLENOL) 500 MG tablet, Take 1,000 mg by mouth every 6 (six) hours as needed for mild pain., Disp: , Rfl:  .  atorvastatin (LIPITOR) 10 MG tablet, Take 1 tablet (10 mg total) by mouth daily., Disp: 30 tablet, Rfl: 5 .  enoxaparin (LOVENOX) 120 MG/0.8ML injection, Inject 0.8 mLs (120 mg total) into the skin every 12 (twelve) hours., Disp: 80 mL, Rfl: 1 .  escitalopram (LEXAPRO) 20 MG tablet, Take 1 tablet (20 mg total) by mouth daily., Disp: 90 tablet, Rfl: 2 .  fluticasone (FLONASE) 50 MCG/ACT nasal spray, Place 1 spray into both nostrils daily. , Disp: , Rfl:  .  nitroGLYCERIN (NITROSTAT) 0.4 MG SL tablet, Place 1 tablet (0.4 mg total) under the tongue every 5 (five) minutes as needed for chest pain., Disp: 15 tablet, Rfl: 2 .  pantoprazole (PROTONIX) 40 MG tablet, Take 1 tablet (40 mg total) by mouth 2 (two) times daily., Disp: 180 tablet, Rfl: 1 .  telmisartan-hydrochlorothiazide (MICARDIS HCT) 80-25 MG tablet, Take 1 tablet by mouth daily., Disp: 90 tablet, Rfl: 2 .  ketoconazole (NIZORAL) 2 % cream, Apply to affected area 1-2 times daily, Disp: 15 g, Rfl: 0 .  Sod  Picosulfate-Mag Ox-Cit Acd (CLENPIQ) 10-3.5-12 MG-GM -GM/160ML SOLN, Take 1 kit by mouth as directed. (Patient not taking: Reported on 06/18/2020), Disp: 320 mL, Rfl: 0   Allergies  Allergen Reactions  . Diltiazem Hcl Diarrhea and Other (See Comments)    Lethargic     Review of Systems   ROS  Negative unless otherwise specified per HPI.  Vitals:   Vitals:   06/18/20 0728  BP: 120/84  Pulse: 96  Temp: (!) 97.2 F (36.2 C)  TempSrc: Temporal  SpO2: 97%  Weight:  285 lb (129.3 kg)  Height: '6\' 1"'  (1.854 m)     Body mass index is 37.6 kg/m.   Physical Exam:    Physical Exam Vitals and nursing note reviewed.  Constitutional:      General: He is not in acute distress.    Appearance: He is well-developed. He is not ill-appearing or toxic-appearing.  Cardiovascular:     Rate and Rhythm: Normal rate and regular rhythm.     Pulses: Normal pulses.     Heart sounds: Normal heart sounds, S1 normal and S2 normal.     Comments: No LE edema Pulmonary:     Effort: Pulmonary effort is normal.     Breath sounds: Normal breath sounds.  Skin:    General: Skin is warm and dry.     Comments: Pedunculated skin tag approximately 0.25 cm in inner left groin  Various light brown plaques scattered on left side of abdominal wall and upper chest  Neurological:     Mental Status: He is alert.     GCS: GCS eye subscore is 4. GCS verbal subscore is 5. GCS motor subscore is 6.  Psychiatric:        Speech: Speech normal.        Behavior: Behavior normal. Behavior is cooperative.    Procedure: Skin tag removal Informed consent:  Discussed risks (permanent scarring, infection, pain, bleeding, bruising, redness, and recurrence of the lesion) and benefits of the procedure, as well as the alternatives.  He is aware that skin tags are benign lesions, and their removal is often not considered medically necessary.  Informed consent was obtained. Anesthesia: Lidocaine with epi The area was prepared  and draped in a standard fashion. Snip removal was performed.   We used silver nitrate and electrocautery and bleeding was controlled prior to patient leaving office. Antibiotic ointment and a sterile dressing were applied.   The patient tolerated procedure well. Number of lesions removed:  1  Assessment and Plan:    Jesten was seen today for discuss referral.  Diagnoses and all orders for this visit:  Dyspnea on exertion Will refer to cardiology for further evaluation and management given ongoing dyspnea on exertion despite pulmonology and hematology intervention.  Per recent CT also has cardiomegaly and CAD.  Rash Unclear etiology.  Will trial ketoconazole cream to affected areas.  Also encourage patient to follow-up with GI regarding his recent increased liver labs.  Skin tag Skin tag was removed today without any concerns.  Other orders -     ketoconazole (NIZORAL) 2 % cream; Apply to affected area 1-2 times daily  . Reviewed expectations re: course of current medical issues. . Discussed self-management of symptoms. . Outlined signs and symptoms indicating need for more acute intervention. . Patient verbalized understanding and all questions were answered. . See orders for this visit as documented in the electronic medical record. . Patient received an After Visit Summary.  CMA or LPN served as scribe during this visit. History, Physical, and Plan performed by medical provider. The above documentation has been reviewed and is accurate and complete.  Inda Coke, PA-C [place], [place] 06/18/2020  Follow-up: No follow-ups on file.

## 2020-06-20 ENCOUNTER — Encounter: Payer: BC Managed Care – PPO | Admitting: Gastroenterology

## 2020-06-26 ENCOUNTER — Other Ambulatory Visit: Payer: Self-pay

## 2020-06-26 ENCOUNTER — Telehealth: Payer: Self-pay | Admitting: Internal Medicine

## 2020-06-26 ENCOUNTER — Emergency Department (HOSPITAL_COMMUNITY): Payer: BC Managed Care – PPO

## 2020-06-26 ENCOUNTER — Telehealth: Payer: Self-pay | Admitting: *Deleted

## 2020-06-26 ENCOUNTER — Encounter (HOSPITAL_COMMUNITY): Payer: Self-pay

## 2020-06-26 ENCOUNTER — Inpatient Hospital Stay (HOSPITAL_COMMUNITY)
Admission: EM | Admit: 2020-06-26 | Discharge: 2020-06-29 | DRG: 193 | Disposition: A | Discharge status: Home / Self Care | Payer: BC Managed Care – PPO | Attending: Internal Medicine | Admitting: Internal Medicine

## 2020-06-26 DIAGNOSIS — Z86711 Personal history of pulmonary embolism: Secondary | ICD-10-CM

## 2020-06-26 DIAGNOSIS — J189 Pneumonia, unspecified organism: Secondary | ICD-10-CM | POA: Diagnosis present

## 2020-06-26 DIAGNOSIS — I10 Essential (primary) hypertension: Secondary | ICD-10-CM | POA: Diagnosis not present

## 2020-06-26 DIAGNOSIS — Z6837 Body mass index (BMI) 37.0-37.9, adult: Secondary | ICD-10-CM

## 2020-06-26 DIAGNOSIS — Z79899 Other long term (current) drug therapy: Secondary | ICD-10-CM

## 2020-06-26 DIAGNOSIS — E860 Dehydration: Secondary | ICD-10-CM | POA: Diagnosis not present

## 2020-06-26 DIAGNOSIS — Z8371 Family history of colonic polyps: Secondary | ICD-10-CM

## 2020-06-26 DIAGNOSIS — I2510 Atherosclerotic heart disease of native coronary artery without angina pectoris: Secondary | ICD-10-CM | POA: Diagnosis not present

## 2020-06-26 DIAGNOSIS — Z9049 Acquired absence of other specified parts of digestive tract: Secondary | ICD-10-CM

## 2020-06-26 DIAGNOSIS — R0602 Shortness of breath: Secondary | ICD-10-CM | POA: Diagnosis not present

## 2020-06-26 DIAGNOSIS — Z8249 Family history of ischemic heart disease and other diseases of the circulatory system: Secondary | ICD-10-CM

## 2020-06-26 DIAGNOSIS — Z86718 Personal history of other venous thrombosis and embolism: Secondary | ICD-10-CM

## 2020-06-26 DIAGNOSIS — J9601 Acute respiratory failure with hypoxia: Secondary | ICD-10-CM | POA: Diagnosis not present

## 2020-06-26 DIAGNOSIS — R0902 Hypoxemia: Secondary | ICD-10-CM

## 2020-06-26 DIAGNOSIS — D6861 Antiphospholipid syndrome: Secondary | ICD-10-CM | POA: Diagnosis present

## 2020-06-26 DIAGNOSIS — Z888 Allergy status to other drugs, medicaments and biological substances status: Secondary | ICD-10-CM

## 2020-06-26 DIAGNOSIS — Z83438 Family history of other disorder of lipoprotein metabolism and other lipidemia: Secondary | ICD-10-CM | POA: Diagnosis not present

## 2020-06-26 DIAGNOSIS — F411 Generalized anxiety disorder: Secondary | ICD-10-CM | POA: Diagnosis present

## 2020-06-26 DIAGNOSIS — F329 Major depressive disorder, single episode, unspecified: Secondary | ICD-10-CM | POA: Diagnosis not present

## 2020-06-26 DIAGNOSIS — R918 Other nonspecific abnormal finding of lung field: Secondary | ICD-10-CM | POA: Diagnosis not present

## 2020-06-26 DIAGNOSIS — K224 Dyskinesia of esophagus: Secondary | ICD-10-CM | POA: Diagnosis present

## 2020-06-26 DIAGNOSIS — I119 Hypertensive heart disease without heart failure: Secondary | ICD-10-CM | POA: Diagnosis present

## 2020-06-26 DIAGNOSIS — Z20822 Contact with and (suspected) exposure to covid-19: Secondary | ICD-10-CM | POA: Diagnosis present

## 2020-06-26 DIAGNOSIS — M1990 Unspecified osteoarthritis, unspecified site: Secondary | ICD-10-CM | POA: Diagnosis present

## 2020-06-26 DIAGNOSIS — Z87891 Personal history of nicotine dependence: Secondary | ICD-10-CM

## 2020-06-26 DIAGNOSIS — R042 Hemoptysis: Secondary | ICD-10-CM | POA: Diagnosis not present

## 2020-06-26 DIAGNOSIS — F32A Depression, unspecified: Secondary | ICD-10-CM | POA: Diagnosis present

## 2020-06-26 DIAGNOSIS — N179 Acute kidney failure, unspecified: Secondary | ICD-10-CM | POA: Diagnosis present

## 2020-06-26 DIAGNOSIS — E782 Mixed hyperlipidemia: Secondary | ICD-10-CM | POA: Diagnosis not present

## 2020-06-26 DIAGNOSIS — E785 Hyperlipidemia, unspecified: Secondary | ICD-10-CM | POA: Diagnosis present

## 2020-06-26 DIAGNOSIS — R590 Localized enlarged lymph nodes: Secondary | ICD-10-CM | POA: Diagnosis not present

## 2020-06-26 DIAGNOSIS — K219 Gastro-esophageal reflux disease without esophagitis: Secondary | ICD-10-CM | POA: Diagnosis not present

## 2020-06-26 DIAGNOSIS — Z7902 Long term (current) use of antithrombotics/antiplatelets: Secondary | ICD-10-CM

## 2020-06-26 DIAGNOSIS — Z8719 Personal history of other diseases of the digestive system: Secondary | ICD-10-CM | POA: Diagnosis not present

## 2020-06-26 LAB — COMPREHENSIVE METABOLIC PANEL
ALT: 50 U/L — ABNORMAL HIGH (ref 0–44)
AST: 25 U/L (ref 15–41)
Albumin: 4.1 g/dL (ref 3.5–5.0)
Alkaline Phosphatase: 42 U/L (ref 38–126)
Anion gap: 11 (ref 5–15)
BUN: 20 mg/dL (ref 6–20)
CO2: 27 mmol/L (ref 22–32)
Calcium: 9.3 mg/dL (ref 8.9–10.3)
Chloride: 98 mmol/L (ref 98–111)
Creatinine, Ser: 1.28 mg/dL — ABNORMAL HIGH (ref 0.61–1.24)
GFR calc Af Amer: >60 mL/min (ref >60)
GFR calc non Af Amer: >60 mL/min (ref >60)
Glucose, Bld: 91 mg/dL (ref 70–99)
Potassium: 4.1 mmol/L (ref 3.5–5.1)
Sodium: 136 mmol/L (ref 135–145)
Total Bilirubin: 1.1 mg/dL (ref 0.3–1.2)
Total Protein: 7.6 g/dL (ref 6.5–8.1)

## 2020-06-26 LAB — CBC WITH DIFFERENTIAL/PLATELET
Abs Immature Granulocytes: 0.05 10*3/uL (ref 0.00–0.07)
Basophils Absolute: 0.1 10*3/uL (ref 0.0–0.1)
Basophils Relative: 1 %
Eosinophils Absolute: 0.1 10*3/uL (ref 0.0–0.5)
Eosinophils Relative: 2 %
HCT: 43.6 % (ref 39.0–52.0)
Hemoglobin: 14.5 g/dL (ref 13.0–17.0)
Immature Granulocytes: 1 %
Lymphocytes Relative: 15 %
Lymphs Abs: 1.3 10*3/uL (ref 0.7–4.0)
MCH: 30.3 pg (ref 26.0–34.0)
MCHC: 33.3 g/dL (ref 30.0–36.0)
MCV: 91 fL (ref 80.0–100.0)
Monocytes Absolute: 0.7 10*3/uL (ref 0.1–1.0)
Monocytes Relative: 8 %
Neutro Abs: 6.5 10*3/uL (ref 1.7–7.7)
Neutrophils Relative %: 73 %
Platelets: 222 10*3/uL (ref 150–400)
RBC: 4.79 MIL/uL (ref 4.22–5.81)
RDW: 12.6 % (ref 11.5–15.5)
WBC: 8.7 10*3/uL (ref 4.0–10.5)
nRBC: 0 % (ref 0.0–0.2)

## 2020-06-26 LAB — SARS CORONAVIRUS 2 BY RT PCR (HOSPITAL ORDER, PERFORMED IN ~~LOC~~ HOSPITAL LAB): SARS Coronavirus 2: NEGATIVE

## 2020-06-26 LAB — EXPECTORATED SPUTUM ASSESSMENT W GRAM STAIN, RFLX TO RESP C

## 2020-06-26 LAB — TROPONIN I (HIGH SENSITIVITY)
Troponin I (High Sensitivity): 3 ng/L (ref <18)
Troponin I (High Sensitivity): 3 ng/L (ref <18)

## 2020-06-26 MED ORDER — IOHEXOL 350 MG/ML SOLN
100.0000 mL | Freq: Once | INTRAVENOUS | Status: AC | PRN
  Administered 2020-06-26: 75 mL via INTRAVENOUS

## 2020-06-26 MED ORDER — ENOXAPARIN SODIUM 120 MG/0.8ML ~~LOC~~ SOLN
120.0000 mg | Freq: Two times a day (BID) | SUBCUTANEOUS | Status: DC
  Administered 2020-06-26 – 2020-06-29 (×6): 120 mg via SUBCUTANEOUS
  Filled 2020-06-26 (×8): qty 0.8

## 2020-06-26 MED ORDER — ALBUTEROL SULFATE (2.5 MG/3ML) 0.083% IN NEBU: 2.5000 mg | INHALATION_SOLUTION | RESPIRATORY_TRACT | Status: DC | PRN

## 2020-06-26 MED ORDER — RISAQUAD PO CAPS
1.0000 | ORAL_CAPSULE | Freq: Every day | ORAL | Status: DC
  Administered 2020-06-26 – 2020-06-29 (×4): 1 via ORAL
  Filled 2020-06-26 (×4): qty 1

## 2020-06-26 MED ORDER — ESCITALOPRAM OXALATE 20 MG PO TABS
20.0000 mg | ORAL_TABLET | Freq: Every day | ORAL | Status: DC
  Administered 2020-06-26 – 2020-06-29 (×4): 20 mg via ORAL
  Filled 2020-06-26 (×3): qty 1
  Filled 2020-06-26: qty 2

## 2020-06-26 MED ORDER — TELMISARTAN-HCTZ 80-25 MG PO TABS: 1.0000 | ORAL_TABLET | Freq: Every day | ORAL | Status: DC

## 2020-06-26 MED ORDER — HYDROCHLOROTHIAZIDE 25 MG PO TABS
25.0000 mg | ORAL_TABLET | Freq: Every day | ORAL | Status: DC
  Administered 2020-06-26 – 2020-06-29 (×4): 25 mg via ORAL
  Filled 2020-06-26 (×4): qty 1

## 2020-06-26 MED ORDER — SODIUM CHLORIDE 0.9 % IV SOLN
500.0000 mg | Freq: Once | INTRAVENOUS | Status: AC
  Administered 2020-06-26: 500 mg via INTRAVENOUS
  Filled 2020-06-26: qty 500

## 2020-06-26 MED ORDER — PHENYLEPHRINE HCL 10 MG PO TABS: 10.0000 mg | ORAL_TABLET | ORAL | Status: DC | PRN

## 2020-06-26 MED ORDER — FLUTICASONE PROPIONATE 50 MCG/ACT NA SUSP
1.0000 | Freq: Every day | NASAL | Status: DC
  Administered 2020-06-27 – 2020-06-29 (×3): 1 via NASAL
  Filled 2020-06-26: qty 16

## 2020-06-26 MED ORDER — PANTOPRAZOLE SODIUM 40 MG PO TBEC
40.0000 mg | DELAYED_RELEASE_TABLET | Freq: Two times a day (BID) | ORAL | Status: DC
  Administered 2020-06-26 – 2020-06-29 (×7): 40 mg via ORAL
  Filled 2020-06-26 (×7): qty 1

## 2020-06-26 MED ORDER — IRBESARTAN 300 MG PO TABS
300.0000 mg | ORAL_TABLET | Freq: Every day | ORAL | Status: DC
  Administered 2020-06-26 – 2020-06-29 (×4): 300 mg via ORAL
  Filled 2020-06-26 (×4): qty 1

## 2020-06-26 MED ORDER — SODIUM CHLORIDE 0.9 % IV SOLN: INTRAVENOUS | Status: AC

## 2020-06-26 MED ORDER — SODIUM CHLORIDE 0.9 % IV SOLN
500.0000 mg | INTRAVENOUS | Status: DC
  Administered 2020-06-27 – 2020-06-28 (×2): 500 mg via INTRAVENOUS
  Filled 2020-06-26 (×3): qty 500

## 2020-06-26 MED ORDER — SODIUM CHLORIDE 0.9 % IV SOLN
2.0000 g | INTRAVENOUS | Status: DC
  Administered 2020-06-27 – 2020-06-28 (×2): 2 g via INTRAVENOUS
  Filled 2020-06-26: qty 2
  Filled 2020-06-26 (×2): qty 20

## 2020-06-26 MED ORDER — ATORVASTATIN CALCIUM 10 MG PO TABS
10.0000 mg | ORAL_TABLET | Freq: Every day | ORAL | Status: DC
  Administered 2020-06-26 – 2020-06-29 (×4): 10 mg via ORAL
  Filled 2020-06-26 (×4): qty 1

## 2020-06-26 MED ORDER — SODIUM CHLORIDE 0.9 % IV SOLN
1.0000 g | Freq: Once | INTRAVENOUS | Status: AC
  Administered 2020-06-26: 1 g via INTRAVENOUS
  Filled 2020-06-26: qty 10

## 2020-06-26 NOTE — ED Triage Notes (Signed)
Patient c/o shob when he woke up this morning.   Patient reports coughing up blood. Patient reports taking blood thinners.    Hx. PE, DVT, and recent pneumonia    A/Ox4 Ambulatory in triage Patient able to speak in complete sentences  Denies chest pain at this time.

## 2020-06-26 NOTE — Telephone Encounter (Signed)
Aware. 

## 2020-06-26 NOTE — ED Notes (Signed)
Floor stating charge has not yet approved bed.

## 2020-06-26 NOTE — ED Provider Notes (Signed)
[facility] DEPT Provider Note   CSN: [id number] Arrival date & time: 06/26/20  1032     History Chief Complaint  Patient presents with   Shortness of Breath    Wesley Hancock is a 49 y.o. male with pertinent past medical history of antiphospholipid syndrome, PE on Lovenox twice daily, hypertension, hyperlipidemia, depression that presents the emergency department today for shortness of breath.  Patient had bilateral PE last year with with bilateral pneumonia, states that he has been doing well with his Lovenox.  States that he has been compliant.  Per chart review he was seen here in the beginning of this month for chest pain, was diagnosed with bilateral pneumonia.  At that time he did not have any clinical respiratory symptoms, he was treated with doxycycline.  Today states that he woke up feeling short of breath, states that after he finished doxycycline his chest pain had resolved.  States that this morning he also noticed that he was having a couple episodes of hemoptysis every time he coughed.  States that cough is new, however states that whenever he feels short of breath he does cough.  Also admits to chest pain whenever he coughs, also states that this is common for him.  Denies any chest pain radiating down his arm.  Denies any cardiac history.  Denies any fevers, chills, nausea, vomiting, abdominal pain, back pain, paresthesias, weakness.  States that the shortness of breath is worse on exertion.  Denies any history of COPD, asthma, CHF.  Denies any leg swelling, weight changes.  Denies any sick contacts.  Has gotten both Covid vaccines.     Past Medical History:  Diagnosis Date   Anxiety    APS (antiphospholipid syndrome) ([place])    Arthritis    Colon polyp    ? hyperplastic   Depression    DVT (deep venous thrombosis) ([name])    Esophageal spasm    HTN (hypertension)    Hyperlipidemia    Nutcracker esophagus    Pneumonia     Pulmonary embolism ([place])     Patient Active Problem List   Diagnosis Date Noted   Diarrhea 05/21/2020   Upper airway cough syndrome 01/05/2020   Paresthesia of right foot 11/08/2019   Antiphospholipid syndrome ([place]) 10/30/2019   Pulmonary infiltrates 09/10/2019   Morbid obesity due to excess calories ([place]) c/b hbp/ dvt/PE 09/10/2019   Acute nonintractable headache 09/06/2019   Lobar pneumonia ([place]) 08/30/2019   Acute respiratory failure with hypoxia ([place]) 08/30/2019   AKI (acute kidney injury) ([place]) 08/30/2019   DVT (deep venous thrombosis) ([place]) 08/30/2019   Multiple pulmonary emboli ([place]) 08/22/2019   Dyspnea on exertion 09/08/2017   Depression 06/24/2017   GAD (generalized anxiety disorder) 08/06/2016   Mixed hyperlipidemia 09/18/2015   Essential hypertension 09/23/2014   Nutcracker esophagus 09/21/2014   Lung nodule 09/21/2014    Past Surgical History:  Procedure Laterality Date   CHOLECYSTECTOMY         Family History  Problem Relation Age of Onset   Diabetes Mother    Hyperlipidemia Mother    Hypertension Mother    Heart disease Father 65   Hyperlipidemia Father    Hypertension Father    Prostate cancer Father 90   Lung cancer Father 24       Dx 06/18/2017   Colon polyps Father    Irritable bowel syndrome Father    Diverticulitis Father    Diabetes Sister    Hyperlipidemia Sister  Hypertension Sister    Diabetes Maternal Grandmother    Heart disease Maternal Grandmother    Hyperlipidemia Maternal Grandmother    Hypertension Maternal Grandmother    Colon cancer Maternal Grandmother    Heart disease Maternal Grandfather    Hyperlipidemia Maternal Grandfather    Hypertension Maternal Grandfather    Stroke Maternal Grandfather    Liver cancer Maternal Grandfather    Irritable bowel syndrome Maternal Grandfather    Heart disease Paternal Grandmother     Social History   Tobacco Use   Smoking status:  Former Smoker    Packs/day: 1.00    Years: 20.00    Pack years: 20.00    Types: Cigarettes    Quit date: 11/30/2013    Years since quitting: 6.5   Smokeless tobacco: Former Systems developer    Types: Snuff    Quit date: 05/21/2018  Vaping Use   Vaping Use: Never used  Substance Use Topics   Alcohol use: Yes    Alcohol/week: 5.0 standard drinks    Types: 5 Cans of beer per week    Comment: 1 per day   Drug use: No    Home Medications Prior to Admission medications   Medication Sig Start Date End Date Taking? Authorizing Provider  acetaminophen (TYLENOL) 500 MG tablet Take 1,000 mg by mouth every 6 (six) hours as needed for mild pain.   Yes [provider]  atorvastatin (LIPITOR) 10 MG tablet Take 1 tablet (10 mg total) by mouth daily. 06/19/19  Yes Inda Coke, PA  enoxaparin (LOVENOX) 120 MG/0.8ML injection Inject 0.8 mLs (120 mg total) into the skin every 12 (twelve) hours. 03/01/20  Yes [place], Holli Humbles, NP  escitalopram (LEXAPRO) 20 MG tablet Take 1 tablet (20 mg total) by mouth daily. 10/30/19  Yes [place], Aldona Bar, PA  fluticasone (FLONASE) 50 MCG/ACT nasal spray Place 1 spray into both nostrils daily.    Yes [provider]  pantoprazole (PROTONIX) 40 MG tablet Take 1 tablet (40 mg total) by mouth 2 (two) times daily. 03/08/20  Yes Parrett, Tammy S, NP  phenylephrine (SUDAFED PE) 10 MG TABS tablet Take 10 mg by mouth every 4 (four) hours as needed (congestion).   Yes [provider]  Probiotic Product (PROBIOTIC PO) Take 1 capsule by mouth daily.   Yes [provider]  telmisartan-hydrochlorothiazide (MICARDIS HCT) 80-25 MG tablet Take 1 tablet by mouth daily. 10/30/19  Yes Inda Coke, PA  ketoconazole (NIZORAL) 2 % cream Apply to affected area 1-2 times daily Patient not taking: Reported on 06/26/2020 06/18/20   Inda Coke, PA  nitroGLYCERIN (NITROSTAT) 0.4 MG SL tablet Place 1 tablet (0.4 mg total) under the tongue every 5 (five) minutes  as needed for chest pain. 03/07/18   Inda Coke, PA  Sod Picosulfate-Mag Ox-Cit Acd (CLENPIQ) 10-3.5-12 MG-GM -GM/160ML SOLN Take 1 kit by mouth as directed. Patient not taking: Reported on 06/18/2020 05/21/20   Noralyn Pick, NP    Allergies    Diltiazem hcl  Review of Systems   Review of Systems  Constitutional: Negative for chills, diaphoresis, fatigue and fever.  HENT: Negative for congestion, sore throat and trouble swallowing.   Eyes: Negative for pain and visual disturbance.  Respiratory: Positive for cough and shortness of breath. Negative for wheezing.   Cardiovascular: Negative for chest pain, palpitations and leg swelling.  Gastrointestinal: Negative for abdominal distention, abdominal pain, diarrhea, nausea and vomiting.  Genitourinary: Negative for difficulty urinating.  Musculoskeletal: Negative for back pain, neck pain  and neck stiffness.  Skin: Negative for pallor.  Neurological: Negative for dizziness, speech difficulty, weakness and headaches.  Psychiatric/Behavioral: Negative for confusion.    Physical Exam Updated Vital Signs BP (!) 133/86 (BP Location: Right Arm)    Pulse 83    Temp 98.1 F (36.7 C)    Resp 19    Ht _0  (1.854 m)    Wt (!) 129 kg    SpO2 98%    BMI 37.52 kg/m   Physical Exam Constitutional:      General: He is not in acute distress.    Appearance: Normal appearance. He is not ill-appearing, toxic-appearing or diaphoretic.     Comments: Patient desats to 91% when speaking to him on room air.  Is able to speak to me in full sentences.  No accessory muscle usage.  Does not appear to be in acute distress.  HENT:     Head: Normocephalic and atraumatic.     Mouth/Throat:     Mouth: Mucous membranes are moist.     Pharynx: Oropharynx is clear.  Eyes:     General: No scleral icterus.    Extraocular Movements: Extraocular movements intact.     Pupils: Pupils are equal, round, and reactive to light.  Cardiovascular:     Rate and  Rhythm: Normal rate and regular rhythm.     Pulses: Normal pulses.     Heart sounds: Normal heart sounds.  Pulmonary:     Effort: Pulmonary effort is normal. No respiratory distress.     Breath sounds: Normal breath sounds. No stridor. No wheezing, rhonchi or rales.  Chest:     Chest wall: No tenderness.  Abdominal:     General: Abdomen is flat. There is no distension.     Palpations: Abdomen is soft.     Tenderness: There is no abdominal tenderness. There is no guarding or rebound.  Musculoskeletal:        General: No swelling or tenderness. Normal range of motion.     Cervical back: Normal range of motion and neck supple. No rigidity.     Right lower leg: No edema.     Left lower leg: No edema.  Skin:    General: Skin is warm and dry.     Capillary Refill: Capillary refill takes less than 2 seconds.     Coloration: Skin is not pale.  Neurological:     General: No focal deficit present.     Mental Status: He is alert and oriented to person, place, and time.     Cranial Nerves: No cranial nerve deficit.     Sensory: No sensory deficit.     Motor: No weakness.     Coordination: Coordination normal.  Psychiatric:        Mood and Affect: Mood normal.        Behavior: Behavior normal.     ED Results / Procedures / Treatments   Labs (all labs ordered are listed, but only abnormal results are displayed) Labs Reviewed  COMPREHENSIVE METABOLIC PANEL - Abnormal; Notable for the following components:      Result Value   Creatinine, Ser 1.28 (*)    ALT 50 (*)    All other components within normal limits  SARS CORONAVIRUS 2 BY RT PCR (HOSPITAL ORDER, [facility])  CBC WITH DIFFERENTIAL/PLATELET  TROPONIN I (HIGH SENSITIVITY)  TROPONIN I (HIGH SENSITIVITY)    EKG EKG Interpretation  Date/Time:  Wednesday June 26 2020 12:15:29 EDT  Ventricular Rate:  95 PR Interval:    QRS Duration: 90 QT Interval:  352 QTC Calculation: 443 R Axis:   22 Text  Interpretation: Sinus rhythm No significant change since last tracing Confirmed by Blanchie Dessert [phone]) on 06/26/2020 1:04:52 PM   Radiology DG Chest 2 View  Result Date: 06/26/2020 CLINICAL DATA:  Shortness of breath, history of prior blood clots EXAM: CHEST - 2 VIEW COMPARISON:  May 30, 2020 FINDINGS: Trachea midline. The cardiomediastinal contours and hilar structures are normal. Patchy bilateral opacities in the chest. Similar, perhaps slightly more confluent when compared to the study of May 30, 2020. Visualized skeletal structures on limited assessment are unremarkable. IMPRESSION: Patchy bilateral opacities in the chest, perhaps slightly more confluent when compared to the study of May 30, 2020. Findings remain concerning for multifocal infection, correlate with any signs of infection. Would also correlate with any risk factors or laboratory evidence of COVID infection based on distribution and appearance. Suggest follow-up chest CT in 3 months to ensure resolution or track for changes. Electronically Signed   By: Zetta Bills M.D.   On: 06/26/2020 11:59   CT Angio Chest PE W/Cm &/Or Wo Cm  Result Date: 06/26/2020 CLINICAL DATA:  Short of breath upon awakening this morning. Coughing up some blood. Patient reports taking blood thinners. EXAM: CT ANGIOGRAPHY CHEST WITH CONTRAST TECHNIQUE: Multidetector CT imaging of the chest was performed using the standard protocol during bolus administration of intravenous contrast. Multiplanar CT image reconstructions and MIPs were obtained to evaluate the vascular anatomy. CONTRAST:  35m OMNIPAQUE IOHEXOL 350 MG/ML SOLN COMPARISON:  05/30/2020 FINDINGS: Cardiovascular: Study somewhat limited due to respiratory motion. Allowing for this, there is no evidence of a pulmonary embolism. Heart is normal in size. No pericardial effusion. Mild three-vessel coronary artery calcifications. Aorta is normal caliber. No dissection. No atherosclerosis.  Mediastinum/Nodes: Several prominent mediastinal lymph nodes. Are 2 pre-vascular nodes measuring 1 cm short axis. Right paratracheal node just above the azygos arch measures 1.3 cm in short axis. No neck base or axillary masses or enlarged lymph nodes. Mild bilateral hilar adenopathy, largest node on the right measuring 1.2 cm in short axis. Trachea and esophagus are unremarkable. Lungs/Pleura: Bilateral patchy airspace lung opacities, which are greater on the right, have increased when compared to the prior chest CT. No pleural effusion or pneumothorax. Upper Abdomen: No acute findings. Diffuse decreased attenuation of the liver consistent hepatic steatosis. Musculoskeletal: No fracture or acute finding. No osteoblastic or osteolytic lesions. Review of the MIP images confirms the above findings. IMPRESSION: 1. No evidence of a pulmonary embolism. Study mildly degraded by respiratory motion. 2. Multifocal pneumonia. Lung infiltrates have increased when compared to the prior chest CT. There is associated mediastinal and hilar adenopathy, stable from the prior CT. Electronically Signed   By: DLajean ManesM.D.   On: 06/26/2020 12:57    Procedures Procedures (including critical care time)  Medications Ordered in ED Medications  cefTRIAXone (ROCEPHIN) 1 g in sodium chloride 0.9 % 100 mL IVPB (1 g Intravenous New Bag/Given 06/26/20 1326)  azithromycin (ZITHROMAX) 500 mg in sodium chloride 0.9 % 250 mL IVPB (has no administration in time range)  iohexol (OMNIPAQUE) 350 MG/ML injection 100 mL (75 mLs Intravenous Contrast Given 06/26/20 1231)    ED Course  I have reviewed the triage vital signs and the nursing notes.  Pertinent labs & imaging results that were available during my care of the patient were reviewed by me and considered in my medical  decision making (see chart for details).    MDM Rules/Calculators/A&P                          Aqil Goetting is a 49 y.o. male with pertinent past  medical history of antiphospholipid syndrome, PE on Lovenox twice daily, hypertension, hyperlipidemia, depression that presents the emergency department today for shortness of breath.  Patient is hypoxic to 91%, will place on 2 L at this time.  Other vital signs are stable at this time.  CBC and CMP without any abnormalities.  Covid testing today negative.  CT shows no signs of PE, does show multifocal pneumonia, that appears to be worsening.  Will start on ceftriaxone and azithromycin at this time.  After reassessment, patient is desatting to 91% on 2 L again, increase oxygen to 5 L and now patient is a 98%.  Patient needs to come in for hypoxia and new oxygen requirement secondary to pneumonia.  EKG without any signs of ischemia, first troponin less than 3.  Will consult hospitalist at this time.  Patient and wife agreeable for this at this time.  135 spoke to hospitalist, Dr. Lupita Leash who will accept patient.  The patient appears reasonably stabilized for admission considering the current resources, flow, and capabilities available in the ED at this time, and I doubt any other [facility] requiring further screening and/or treatment in the ED prior to admission.   I discussed this case with my attending physician who cosigned this note including patient's presenting symptoms, physical exam, and planned diagnostics and interventions. Attending physician stated agreement with plan or made changes to plan which were implemented.    Final Clinical Impression(s) / ED Diagnoses Final diagnoses:  Multifocal pneumonia  Hypoxia    Rx / DC Orders ED Discharge Orders    None       Alfredia Client, PA-C 06/26/20 1344    Blanchie Dessert, MD 06/28/20 2128

## 2020-06-26 NOTE — ED Notes (Addendum)
Report given to Megan, RN.

## 2020-06-26 NOTE — Telephone Encounter (Signed)
Received voice mail message from patient stating,"please let [name], NP, know that I coughed up blood this morning. I'm having problems with my 02 levels. They are hanging around the 86% range. I'm headed right now to the [facility] ER."

## 2020-06-26 NOTE — H&P (Signed)
History and Physical    Wesley Hancock PRF:[id number] DOB: 1970/12/14 DOA: 06/26/2020  PCP: Inda Coke, PA   Patient coming from:   Chief Complaint  Patient presents with  . Shortness of Breath      HPI: Wesley Hancock is a 49 y.o. male with medical history significant for antiphospholipid syndrome/history of DVT/PE on Lovenox, GERD/depression, hypertension, hyperlipidemia comes to the ED for evaluation of shortness of breath when he woke up this morning.Patient had ED evaluation in beginning of the month found to have pneumonia and sent on doxycycline x 14 days. Has been compliant with his medication.  He is followed by Dr. Melvyn Novas from [facility]. Patient reports he sometimes gets chest tightness especially with coughing but not new, denies chest pain.  He was treated for pneumonia beginning of July with 14 days course of doxycycline but has not had significant improvement.  Reports at times when he does hard coughing he can get blood in the sputum and this is not new and attributes this to the Lovenox.  He had couple episodes of hemoptysis during coughing today.  Patient denies any nausea, vomiting, abdominal pain, focal weakness, headache, blurring, numbness tingling or fever.  ED Course: Patient was 90% on room air placed on oxygen however on 2 L he was further low and 89% and currently on 5 nasal cannula.  Lab with stable CBC BMP, creatinine 1.2. CT Angio: "No evidence of a pulmonary embolism. Study mildly degraded by respiratory motion. 2. Multifocal pneumonia. Lung infiltrates have increased when compared to the prior chest CT. There is associated mediastinal and hilar adenopathy, stable from the prior CT".  Patient was placed on ceftriaxone and azithromycin and admission was requested.  Review of Systems: All systems were reviewed and were negative except as mentioned in HPI above. Negative for fever Negative for focal weakness Negative for vomiting  Past Medical  History:  Diagnosis Date  . Anxiety   . APS (antiphospholipid syndrome) ([place])   . Arthritis   . Colon polyp    ? hyperplastic  . Depression   . DVT (deep venous thrombosis) ([place])   . Esophageal spasm   . HTN (hypertension)   . Hyperlipidemia   . Nutcracker esophagus   . Pneumonia   . Pulmonary embolism [facility])     Past Surgical History:  Procedure Laterality Date  . CHOLECYSTECTOMY       reports that he quit smoking about 6 years ago. His smoking use included cigarettes. He has a 20.00 pack-year smoking history. He quit smokeless tobacco use about 2 years ago.  His smokeless tobacco use included snuff. He reports current alcohol use of about 5.0 standard drinks of alcohol per week. He reports that he does not use drugs.  Allergies  Allergen Reactions  . Diltiazem Hcl Diarrhea and Other (See Comments)    Lethargic     Family History  Problem Relation Age of Onset  . Diabetes Mother   . Hyperlipidemia Mother   . Hypertension Mother   . Heart disease Father 53  . Hyperlipidemia Father   . Hypertension Father   . Prostate cancer Father 54  . Lung cancer Father 2       Dx 06/18/2017  . Colon polyps Father   . Irritable bowel syndrome Father   . Diverticulitis Father   . Diabetes Sister   . Hyperlipidemia Sister   . Hypertension Sister   . Diabetes Maternal Grandmother   . Heart disease Maternal Grandmother   . Hyperlipidemia  Maternal Grandmother   . Hypertension Maternal Grandmother   . Colon cancer Maternal Grandmother   . Heart disease Maternal Grandfather   . Hyperlipidemia Maternal Grandfather   . Hypertension Maternal Grandfather   . Stroke Maternal Grandfather   . Liver cancer Maternal Grandfather   . Irritable bowel syndrome Maternal Grandfather   . Heart disease Paternal Grandmother      Prior to Admission medications   Medication Sig Start Date End Date Taking? Authorizing Provider  acetaminophen (TYLENOL) 500 MG tablet Take 1,000 mg by mouth every 6  (six) hours as needed for mild pain.   Yes [provider]  atorvastatin (LIPITOR) 10 MG tablet Take 1 tablet (10 mg total) by mouth daily. 06/19/19  Yes Inda Coke, PA  enoxaparin (LOVENOX) 120 MG/0.8ML injection Inject 0.8 mLs (120 mg total) into the skin every 12 (twelve) hours. 03/01/20  Yes [place], Holli Humbles, NP  escitalopram (LEXAPRO) 20 MG tablet Take 1 tablet (20 mg total) by mouth daily. 10/30/19  Yes [place], Aldona Bar, PA  fluticasone (FLONASE) 50 MCG/ACT nasal spray Place 1 spray into both nostrils daily.    Yes [provider]  pantoprazole (PROTONIX) 40 MG tablet Take 1 tablet (40 mg total) by mouth 2 (two) times daily. 03/08/20  Yes Parrett, Tammy S, NP  phenylephrine (SUDAFED PE) 10 MG TABS tablet Take 10 mg by mouth every 4 (four) hours as needed (congestion).   Yes [provider]  Probiotic Product (PROBIOTIC PO) Take 1 capsule by mouth daily.   Yes [provider]  telmisartan-hydrochlorothiazide (MICARDIS HCT) 80-25 MG tablet Take 1 tablet by mouth daily. 10/30/19  Yes Inda Coke, PA  ketoconazole (NIZORAL) 2 % cream Apply to affected area 1-2 times daily Patient not taking: Reported on 06/26/2020 06/18/20   Inda Coke, PA  nitroGLYCERIN (NITROSTAT) 0.4 MG SL tablet Place 1 tablet (0.4 mg total) under the tongue every 5 (five) minutes as needed for chest pain. 03/07/18   Inda Coke, PA  Sod Picosulfate-Mag Ox-Cit Acd (CLENPIQ) 10-3.5-12 MG-GM -GM/160ML SOLN Take 1 kit by mouth as directed. Patient not taking: Reported on 06/18/2020 05/21/20   Noralyn Pick, NP    Physical Exam: Vitals:   06/26/20 1054 06/26/20 1134 06/26/20 1158 06/26/20 1316  BP:   (!) 130/86 (!) 133/86  Pulse:   92 83  Resp:   20 19  Temp:      SpO2: 94%  92% 98%  Weight:  (!) 129 kg    Height:  '6\' 1"'  (1.854 m)      General exam: AAOx3 , NAD, weak appearing. HEENT:Oral mucosa moist, Ear/Nose WNL grossly, dentition normal. Respiratory  system: bilaterally air entry present with mild basal crackles, no wheezing,no use of accessory muscle Cardiovascular system: S1 & S2 +, No JVD,. Gastrointestinal system: Abdomen soft, obese, NT,ND, BS+ Nervous System:Alert, awake, moving extremities and grossly nonfocal Extremities: No edema, distal peripheral pulses palpable.  Skin: No rashes,no icterus. MSK: Normal muscle bulk,tone, power   Labs on Admission: I have personally reviewed following labs and imaging studies  CBC: Recent Labs  Lab 06/26/20 1115  WBC 8.7  NEUTROABS 6.5  HGB 14.5  HCT 43.6  MCV 91.0  PLT 161   Basic Metabolic Panel: Recent Labs  Lab 06/26/20 1115  NA 136  K 4.1  CL 98  CO2 27  GLUCOSE 91  BUN 20  CREATININE 1.28*  CALCIUM 9.3   GFR: Estimated Creatinine Clearance: 99.3 mL/min (A) (by C-G formula based on  SCr of 1.28 mg/dL (H)). Liver Function Tests: Recent Labs  Lab 06/26/20 1115  AST 25  ALT 50*  ALKPHOS 42  BILITOT 1.1  PROT 7.6  ALBUMIN 4.1   No results for input(s): LIPASE, AMYLASE in the last 168 hours. No results for input(s): AMMONIA in the last 168 hours. Coagulation Profile: No results for input(s): INR, PROTIME in the last 168 hours. Cardiac Enzymes: No results for input(s): CKTOTAL, CKMB, CKMBINDEX, TROPONINI in the last 168 hours. BNP (last 3 results) No results for input(s): PROBNP in the last 8760 hours. HbA1C: No results for input(s): HGBA1C in the last 72 hours. CBG: No results for input(s): GLUCAP in the last 168 hours. Lipid Profile: No results for input(s): CHOL, HDL, LDLCALC, TRIG, CHOLHDL, LDLDIRECT in the last 72 hours. Thyroid Function Tests: No results for input(s): TSH, T4TOTAL, FREET4, T3FREE, THYROIDAB in the last 72 hours. Anemia Panel: No results for input(s): VITAMINB12, FOLATE, FERRITIN, TIBC, IRON, RETICCTPCT in the last 72 hours. Urine analysis:    Component Value Date/Time   COLORURINE YELLOW 06/21/2017 0620   APPEARANCEUR CLEAR  06/21/2017 0620   LABSPEC 1.023 06/21/2017 0620   PHURINE 5.0 06/21/2017 0620   GLUCOSEU NEGATIVE 06/21/2017 0620   HGBUR NEGATIVE 06/21/2017 0620   BILIRUBINUR NEGATIVE 06/21/2017 0620   KETONESUR NEGATIVE 06/21/2017 0620   PROTEINUR NEGATIVE 06/21/2017 0620   NITRITE NEGATIVE 06/21/2017 0620   LEUKOCYTESUR NEGATIVE 06/21/2017 0620    Radiological Exams on Admission: DG Chest 2 View  Result Date: 06/26/2020 CLINICAL DATA:  Shortness of breath, history of prior blood clots EXAM: CHEST - 2 VIEW COMPARISON:  May 30, 2020 FINDINGS: Trachea midline. The cardiomediastinal contours and hilar structures are normal. Patchy bilateral opacities in the chest. Similar, perhaps slightly more confluent when compared to the study of May 30, 2020. Visualized skeletal structures on limited assessment are unremarkable. IMPRESSION: Patchy bilateral opacities in the chest, perhaps slightly more confluent when compared to the study of May 30, 2020. Findings remain concerning for multifocal infection, correlate with any signs of infection. Would also correlate with any risk factors or laboratory evidence of COVID infection based on distribution and appearance. Suggest follow-up chest CT in 3 months to ensure resolution or track for changes. Electronically Signed   By: Zetta Bills M.D.   On: 06/26/2020 11:59   CT Angio Chest PE W/Cm &/Or Wo Cm  Result Date: 06/26/2020 CLINICAL DATA:  Short of breath upon awakening this morning. Coughing up some blood. Patient reports taking blood thinners. EXAM: CT ANGIOGRAPHY CHEST WITH CONTRAST TECHNIQUE: Multidetector CT imaging of the chest was performed using the standard protocol during bolus administration of intravenous contrast. Multiplanar CT image reconstructions and MIPs were obtained to evaluate the vascular anatomy. CONTRAST:  85m OMNIPAQUE IOHEXOL 350 MG/ML SOLN COMPARISON:  05/30/2020 FINDINGS: Cardiovascular: Study somewhat limited due to respiratory motion.  Allowing for this, there is no evidence of a pulmonary embolism. Heart is normal in size. No pericardial effusion. Mild three-vessel coronary artery calcifications. Aorta is normal caliber. No dissection. No atherosclerosis. Mediastinum/Nodes: Several prominent mediastinal lymph nodes. Are 2 pre-vascular nodes measuring 1 cm short axis. Right paratracheal node just above the azygos arch measures 1.3 cm in short axis. No neck base or axillary masses or enlarged lymph nodes. Mild bilateral hilar adenopathy, largest node on the right measuring 1.2 cm in short axis. Trachea and esophagus are unremarkable. Lungs/Pleura: Bilateral patchy airspace lung opacities, which are greater on the right, have increased when compared to the prior  chest CT. No pleural effusion or pneumothorax. Upper Abdomen: No acute findings. Diffuse decreased attenuation of the liver consistent hepatic steatosis. Musculoskeletal: No fracture or acute finding. No osteoblastic or osteolytic lesions. Review of the MIP images confirms the above findings. IMPRESSION: 1. No evidence of a pulmonary embolism. Study mildly degraded by respiratory motion. 2. Multifocal pneumonia. Lung infiltrates have increased when compared to the prior chest CT. There is associated mediastinal and hilar adenopathy, stable from the prior CT. Electronically Signed   By: Lajean Manes M.D.   On: 06/26/2020 12:57     Assessment/Plan  Multifocal pneumonia chest shows increasing infiltrates compared to his scan in previous ED visit early in July.  Has received full course of doxycycline, and is very symptomatic with hypoxia.  We will keep on ceftriaxone at [place], order sputum culture,strep and Legionella antigen.  Acute respiratory failure with hypoxia the setting of #1, no PE on CT injury.Continue supplemental oxygen, incentive spirometry and wean O2 as tolerated.  Patient reports that he has noticed reduction has been less than 90% especially during ambulation for  several days.  Hemoptysis likely in the setting of cough and Lovenox use.  If he has recurrent episode here, will consult pulmonary.  He is followed by [facility].  We will monitor him closely.  Mild AKI : creatinine slightly up 1.8, continue gentle IV fluids.  Repeat labs in the morning.  Essential hypertension: Blood pressure is stable.Resume his home meds Micardis HCT.  GAD/Depression: Mood is stable continue on his home Lexapro.  Mixed hyperlipidemia: Continue his Lipitor.  Antiphospholipid syndrome stable DVT and PE on Lovenox.  We will continue his home Lovenox twice daily.  Morbid obesity with BMI Body mass index is 37.52 kg/m. He will benefit with weight loss and healthy lifestyle.  Severity of Illness:  * I certify that at the point of admission it is my clinical judgment that the patient will require inpatient hospital care spanning beyond 2 midnights from the point of admission due to high intensity of service, high risk for further deterioration and high frequency of surveillance required due to acute respiratory failure, pneumonia    DVT prophylaxis: Lovenox Code Status:   Code Status: Prior  Family Communication: Admission, patients condition and plan of care including tests being ordered have been discussed with the patient and his wife who indicate understanding and agree with the plan and Code Status.  Consults called:   Antonieta Pert MD [facility]  If 7PM-7AM, please contact night-coverage [email or web address]  06/26/2020, 1:39 PM

## 2020-06-26 NOTE — Telephone Encounter (Signed)
Left msg on VM ok per DPR letting the pt know that we received his msg and will let Dr Melvyn Novas know what is going on  Dr Melvyn Novas, pt called this morning stating: woke up coughing upblood. Has O2 levels in the 80's. Heading to [facility] ER.

## 2020-06-27 DIAGNOSIS — D6861 Antiphospholipid syndrome: Secondary | ICD-10-CM

## 2020-06-27 DIAGNOSIS — I10 Essential (primary) hypertension: Secondary | ICD-10-CM

## 2020-06-27 DIAGNOSIS — F329 Major depressive disorder, single episode, unspecified: Secondary | ICD-10-CM

## 2020-06-27 DIAGNOSIS — R0902 Hypoxemia: Secondary | ICD-10-CM

## 2020-06-27 LAB — CBC
HCT: 42.4 % (ref 39.0–52.0)
Hemoglobin: 14.2 g/dL (ref 13.0–17.0)
MCH: 30.1 pg (ref 26.0–34.0)
MCHC: 33.5 g/dL (ref 30.0–36.0)
MCV: 90 fL (ref 80.0–100.0)
Platelets: 226 10*3/uL (ref 150–400)
RBC: 4.71 MIL/uL (ref 4.22–5.81)
RDW: 12.8 % (ref 11.5–15.5)
WBC: 5.3 10*3/uL (ref 4.0–10.5)
nRBC: 0 % (ref 0.0–0.2)

## 2020-06-27 LAB — COMPREHENSIVE METABOLIC PANEL
ALT: 48 U/L — ABNORMAL HIGH (ref 0–44)
AST: 26 U/L (ref 15–41)
Albumin: 4.1 g/dL (ref 3.5–5.0)
Alkaline Phosphatase: 44 U/L (ref 38–126)
Anion gap: 8 (ref 5–15)
BUN: 16 mg/dL (ref 6–20)
CO2: 28 mmol/L (ref 22–32)
Calcium: 9.5 mg/dL (ref 8.9–10.3)
Chloride: 102 mmol/L (ref 98–111)
Creatinine, Ser: 1.1 mg/dL (ref 0.61–1.24)
GFR calc Af Amer: >60 mL/min (ref >60)
GFR calc non Af Amer: >60 mL/min (ref >60)
Glucose, Bld: 113 mg/dL — ABNORMAL HIGH (ref 70–99)
Potassium: 3.8 mmol/L (ref 3.5–5.1)
Sodium: 138 mmol/L (ref 135–145)
Total Bilirubin: 0.7 mg/dL (ref 0.3–1.2)
Total Protein: 7.6 g/dL (ref 6.5–8.1)

## 2020-06-27 LAB — STREP PNEUMONIAE URINARY ANTIGEN: Strep Pneumo Urinary Antigen: NEGATIVE

## 2020-06-27 LAB — HIV ANTIBODY (ROUTINE TESTING W REFLEX): HIV Screen 4th Generation wRfx: NONREACTIVE

## 2020-06-27 NOTE — Progress Notes (Signed)
PROGRESS NOTE    Wesley Hancock  JOI:[id number] DOB: 1970-12-27 DOA: 06/26/2020 PCP: Inda Coke, PA    Chief Complaint  Patient presents with  . Shortness of Breath    Brief Narrative:  49 year old male with prior history of antiphospholipid syndrome with history of DVT and PE on Lovenox, GERD, depression, hypertension, hyperlipidemia presents to ED for evaluation of shortness of breath.  Patient follows up with Dr. Melvyn Novas as an outpatient.  CT angiogram of the chest ruled out PE but showed multifocal pneumonia.  With mediastinal or hilar adenopathy. Assessment & Plan:   Active Problems:   Essential hypertension   GAD (generalized anxiety disorder)   Mixed hyperlipidemia   Depression   Acute respiratory failure with hypoxia ([name])   Antiphospholipid syndrome ([name])   Pneumonia   Multi focal pneumonia:  Acute respiratory failure.  With hypoxia on 2 L of nasal cannula oxygen. Started the patient on IV Rocephin and Zithromax. Nasal cannula oxygen to keep sats greater than 90%. Urine streptococcal antigen negative. Follow blood cultures.  Sputum cultures if able. CT angiogram is negative for PE.    Hemoptysis probably secondary to multifocal pneumonia.  No more episodes of hemoptysis this morning.   AKI Probably secondary to dehydration and pneumonia Resolved with IV fluids.    GERD Stable    Essential hypertension Blood pressure parameters are well controlled.    Antiphospholipid syndrome with prior history of DVT and PE Continue with Lovenox.           DVT prophylaxis: Lovenox Code Status: full code.  Family Communication: None at bedside Disposition:   Status is: Inpatient  Remains inpatient appropriate because:IV treatments appropriate due to intensity of illness or inability to take PO   Dispo: The patient is from: Home              Anticipated d/c is to: Home              Anticipated d/c date is: 2 days              Patient  currently is not medically stable to d/c.       Consultants:   None.   Procedures: None Antimicrobials: IV Rocephin and Zithromax since admission.  Subjective: Patient reports hemoptysis and cough are improving.  Objective: Vitals:   06/26/20 2209 06/27/20 0615 06/27/20 0932 06/27/20 1201  BP: (!) 134/87 (!) 133/88 (!) 139/90 (!) 129/90  Pulse: 86 82 80 78  Resp: 22 22  18   Temp: 99.1 F (37.3 C) 98.1 F (36.7 C)  98.3 F (36.8 C)  TempSrc: Oral Oral  Oral  SpO2: 95% 98% 94% 98%  Weight:      Height:        Intake/Output Summary (Last 24 hours) at 06/27/2020 1535 Last data filed at 06/27/2020 1400 Gross per 24 hour  Intake 2007.99 ml  Output --  Net 2007.99 ml   Filed Weights   06/26/20 1134 06/26/20 1631  Weight: (!) 129 kg (!) 129 kg    Examination:  General exam: Appears calm and comfortable on 2 lit of [place] oxygen.  Respiratory system: scattered rhonchi bilateral. Tachypnea.  Cardiovascular system: S1 & S2 heard, RRR. No JVD,  No pedal edema. Gastrointestinal system: Abdomen is nondistended, soft and nontender.Normal bowel sounds heard. Central nervous system: Alert and oriented. No focal neurological deficits. Extremities: Symmetric 5 x 5 power. Skin: No rashes, lesions or ulcers Psychiatry: Mood & affect appropriate.     Data Reviewed: I  have personally reviewed following labs and imaging studies  CBC: Recent Labs  Lab 06/26/20 1115 06/27/20 0521  WBC 8.7 5.3  NEUTROABS 6.5  --   HGB 14.5 14.2  HCT 43.6 42.4  MCV 91.0 90.0  PLT 222 263    Basic Metabolic Panel: Recent Labs  Lab 06/26/20 1115 06/27/20 0521  NA 136 138  K 4.1 3.8  CL 98 102  CO2 27 28  GLUCOSE 91 113*  BUN 20 16  CREATININE 1.28* 1.10  CALCIUM 9.3 9.5    GFR: Estimated Creatinine Clearance: 115.6 mL/min (by C-G formula based on SCr of 1.1 mg/dL).  Liver Function Tests: Recent Labs  Lab 06/26/20 1115 06/27/20 0521  AST 25 26  ALT 50* 48*  ALKPHOS 42 44   BILITOT 1.1 0.7  PROT 7.6 7.6  ALBUMIN 4.1 4.1    CBG: No results for input(s): GLUCAP in the last 168 hours.   Recent Results (from the past 240 hour(s))  SARS Coronavirus 2 by RT PCR (hospital order, performed in [facility] hospital lab) Nasopharyngeal Nasopharyngeal Swab     Status: None   Collection Time: 06/26/20 11:29 AM   Specimen: Nasopharyngeal Swab  Result Value Ref Range Status   SARS Coronavirus 2 NEGATIVE NEGATIVE Final    Comment: (NOTE) SARS-CoV-2 target nucleic acids are NOT DETECTED.  The SARS-CoV-2 RNA is generally detectable in upper and lower respiratory specimens during the acute phase of infection. The lowest concentration of SARS-CoV-2 viral copies this assay can detect is 250 copies / mL. A negative result does not preclude SARS-CoV-2 infection and should not be used as the sole basis for treatment or other patient management decisions.  A negative result may occur with improper specimen collection / handling, submission of specimen other than nasopharyngeal swab, presence of viral mutation(s) within the areas targeted by this assay, and inadequate number of viral copies (<250 copies / mL). A negative result must be combined with clinical observations, patient history, and epidemiological information.  Fact Sheet for Patients:   StrictlyIdeas.no  Fact Sheet for Healthcare Providers: BankingDealers.co.za  This test is not yet approved or  cleared by the [place] FDA and has been authorized for detection and/or diagnosis of SARS-CoV-2 by FDA under an Emergency Use Authorization (EUA).  This EUA will remain in effect (meaning this test can be used) for the duration of the COVID-19 declaration under Section 564(b)(1) of the Act, 21 U.S.C. section 360bbb-3(b)(1), unless the authorization is terminated or revoked sooner.  Performed at [facility], [place] [address] [place],  [place] [address]   Sputum culture     Status: None   Collection Time: 06/26/20  6:18 PM   Specimen: Sputum  Result Value Ref Range Status   Specimen Description SPU  Final   Special Requests NONE  Final   Sputum evaluation   Final    THIS SPECIMEN IS ACCEPTABLE FOR SPUTUM CULTURE Performed at [facility], [place] [address] [place], [place] [address]    Report Status 06/26/2020 FINAL  Final  Culture, respiratory     Status: None (Preliminary result)   Collection Time: 06/26/20  6:18 PM   Specimen: Sputum  Result Value Ref Range Status   Specimen Description   Final    SPU Performed at [place] [address], [place], [place] [address]    Special Requests   Final    NONE Reflexed from [id number] Performed at [facility], [address] [place], [place] [address]    Gram Stain   Final    RARE WBC PRESENT, PREDOMINANTLY PMN RARE GRAM NEGATIVE RODS RARE GRAM POSITIVE COCCI IN PAIRS Performed at [facility], [place] [address] [place], [place] [address]    Culture PENDING  Incomplete   Report Status PENDING  Incomplete         Radiology Studies: DG Chest 2 View  Result Date: 06/26/2020 CLINICAL DATA:  Shortness of breath, history of prior blood clots EXAM: CHEST - 2 VIEW COMPARISON:  May 30, 2020 FINDINGS: Trachea midline. The cardiomediastinal contours and hilar structures are normal. Patchy bilateral opacities in the chest. Similar, perhaps slightly more confluent when compared to the study of May 30, 2020. Visualized skeletal structures on limited assessment are unremarkable. IMPRESSION: Patchy bilateral opacities in the chest, perhaps slightly more confluent when compared to the study of May 30, 2020. Findings remain concerning for multifocal infection, correlate with any signs of infection. Would also correlate with any risk factors or laboratory evidence of COVID infection based on distribution and appearance. Suggest  follow-up chest CT in 3 months to ensure resolution or track for changes. Electronically Signed   By: Zetta Bills M.D.   On: 06/26/2020 11:59   CT Angio Chest PE W/Cm &/Or Wo Cm  Result Date: 06/26/2020 CLINICAL DATA:  Short of breath upon awakening this morning. Coughing up some blood. Patient reports taking blood thinners. EXAM: CT ANGIOGRAPHY CHEST WITH CONTRAST TECHNIQUE: Multidetector CT imaging of the chest was performed using the standard protocol during bolus administration of intravenous contrast. Multiplanar CT image reconstructions and MIPs were obtained to evaluate the vascular anatomy. CONTRAST:  51mL OMNIPAQUE IOHEXOL 350 MG/ML SOLN COMPARISON:  05/30/2020 FINDINGS: Cardiovascular: Study somewhat limited due to respiratory motion. Allowing for this, there is no evidence of a pulmonary embolism. Heart is normal in size. No pericardial effusion. Mild three-vessel coronary artery calcifications. Aorta is normal caliber. No dissection. No atherosclerosis. Mediastinum/Nodes: Several prominent mediastinal lymph nodes. Are 2 pre-vascular nodes measuring 1 cm short axis. Right paratracheal node just above the azygos arch measures 1.3 cm in short axis. No neck base or axillary masses or enlarged lymph nodes. Mild bilateral hilar adenopathy, largest node on the right measuring 1.2 cm in short axis. Trachea and esophagus are unremarkable. Lungs/Pleura: Bilateral patchy airspace lung opacities, which are greater on the right, have increased when compared to the prior chest CT. No pleural effusion or pneumothorax. Upper Abdomen: No acute findings. Diffuse decreased attenuation of the liver consistent hepatic steatosis. Musculoskeletal: No fracture or acute finding. No osteoblastic or osteolytic lesions. Review of the MIP images confirms the above findings. IMPRESSION: 1. No evidence of a pulmonary embolism. Study mildly degraded by respiratory motion. 2. Multifocal pneumonia. Lung infiltrates have increased  when compared to the prior chest CT. There is associated mediastinal and hilar adenopathy, stable from the prior CT. Electronically Signed   By: Lajean Manes M.D.   On: 06/26/2020 12:57        Scheduled Meds: . acidophilus  1 capsule Oral Daily  . atorvastatin  10 mg Oral Daily  . enoxaparin  120 mg Subcutaneous Q12H  . escitalopram  20 mg Oral Daily  . fluticasone  1 spray Each Nare Daily  . irbesartan  300 mg Oral Daily   And  . hydrochlorothiazide  25 mg Oral Daily  . pantoprazole  40 mg Oral BID   Continuous Infusions: . azithromycin 500 mg (06/27/20 1446)  .  cefTRIAXone (ROCEPHIN)  IV 200 mL/hr at 06/27/20 1400     LOS: 1 day        Hosie Poisson, MD [facility]   To contact the attending provider between 7A-7P or the covering provider during after hours 7P-7A, please log into the web site [email or web address] and access using universal [place] password for that web site. If you do not have the password, please call the hospital operator.  06/27/2020, 3:35 PM

## 2020-06-28 LAB — LEGIONELLA PNEUMOPHILA SEROGP 1 UR AG: L. pneumophila Serogp 1 Ur Ag: NEGATIVE

## 2020-06-28 MED ORDER — ACETAMINOPHEN 325 MG PO TABS
650.0000 mg | ORAL_TABLET | ORAL | Status: DC | PRN
  Administered 2020-06-28: 650 mg via ORAL
  Filled 2020-06-28: qty 2

## 2020-06-28 NOTE — Progress Notes (Signed)
PROGRESS NOTE    Wesley Hancock  QIH:[id number] DOB: 1971/10/02 DOA: 06/26/2020 PCP: Inda Coke, PA    Chief Complaint  Patient presents with  . Shortness of Breath    Brief Narrative:  49 year old male with prior history of antiphospholipid syndrome with history of DVT and PE on Lovenox, GERD, depression, hypertension, hyperlipidemia presents to ED for evaluation of shortness of breath.  Patient follows up with Dr. Melvyn Novas as an outpatient.  CT angiogram of the chest ruled out PE but showed multifocal pneumonia.  With mediastinal or hilar adenopathy. Patient seen and examined at bedside today patient reports his breathing and cough is improved and no hemoptysis so far. Assessment & Plan:   Active Problems:   Essential hypertension   GAD (generalized anxiety disorder)   Mixed hyperlipidemia   Depression   Acute respiratory failure with hypoxia ([name])   Antiphospholipid syndrome ([name])   Pneumonia   Multi focal pneumonia/acute respiratory failure with hypoxia currently on 2 L of nasal cannula oxygen. Improving Started the patient on IV Rocephin and Zithromax. Nasal cannula oxygen to keep sats greater than 90%. Urine streptococcal antigen negative.  Legionella antigen is pending. Follow blood cultures.  Sputum cultures if able. CT angiogram is negative for PE. Plan to wean him off the oxygen in the next 24 hours and ambulate and check ambulating oxygen levels prior to discharge.    Hemoptysis probably secondary to multifocal pneumonia.  No hemoptysis in the last 24 hours.   AKI Probably secondary to dehydration and pneumonia Resolved with IV fluids.    GERD Stable    Essential hypertension Blood pressure parameters are very well controlled.    Antiphospholipid syndrome with prior history of DVT and PE Continue with Lovenox.         DVT prophylaxis: Lovenox Code Status: full code.  Family Communication: None at bedside Disposition:   Status  is: Inpatient  Remains inpatient appropriate because:IV treatments appropriate due to intensity of illness or inability to take PO   Dispo: The patient is from: Home              Anticipated d/c is to: Home              Anticipated d/c date is: 2 days              Patient currently is not medically stable to d/c.       Consultants:   None.   Procedures: None Antimicrobials: IV Rocephin and Zithromax since admission.  Subjective: Cough and breathing has improved, no hemoptysis.  Objective: Vitals:   06/27/20 1201 06/27/20 2143 06/28/20 0647 06/28/20 1357  BP: (!) 129/90 (!) 139/97 (!) 146/99 122/81  Pulse: 78 76 81 68  Resp: 18 18 18 16   Temp: 98.3 F (36.8 C) 98 F (36.7 C) 97.9 F (36.6 C) 97.8 F (36.6 C)  TempSrc: Oral Oral  Oral  SpO2: 98% 97% 96% 94%  Weight:      Height:        Intake/Output Summary (Last 24 hours) at 06/28/2020 1751 Last data filed at 06/28/2020 1400 Gross per 24 hour  Intake 2020.71 ml  Output --  Net 2020.71 ml   Filed Weights   06/26/20 1134 06/26/20 1631  Weight: (!) 129 kg (!) 129 kg    Examination:  General exam: Calm and comfortable Respiratory system: Air entry fair bilateral, no wheezing heard, no tachypnea Cardiovascular system: S1-S2 heard, regular rate rhythm, no JVD  gastrointestinal system: Abdomen is soft,  nontender, nondistended, bowel sounds are normal Central nervous system: Alert and oriented, grossly nonfocal Extremities: No cyanosis or clubbing Skin: No lesions seen Psychiatry: Mood is appropriate   Data Reviewed: I have personally reviewed following labs and imaging studies  CBC: Recent Labs  Lab 06/26/20 1115 06/27/20 0521  WBC 8.7 5.3  NEUTROABS 6.5  --   HGB 14.5 14.2  HCT 43.6 42.4  MCV 91.0 90.0  PLT 222 301    Basic Metabolic Panel: Recent Labs  Lab 06/26/20 1115 06/27/20 0521  NA 136 138  K 4.1 3.8  CL 98 102  CO2 27 28  GLUCOSE 91 113*  BUN 20 16  CREATININE 1.28* 1.10   CALCIUM 9.3 9.5    GFR: Estimated Creatinine Clearance: 115.6 mL/min (by C-G formula based on SCr of 1.1 mg/dL).  Liver Function Tests: Recent Labs  Lab 06/26/20 1115 06/27/20 0521  AST 25 26  ALT 50* 48*  ALKPHOS 42 44  BILITOT 1.1 0.7  PROT 7.6 7.6  ALBUMIN 4.1 4.1    CBG: No results for input(s): GLUCAP in the last 168 hours.   Recent Results (from the past 240 hour(s))  SARS Coronavirus 2 by RT PCR (hospital order, performed in [facility] hospital lab) Nasopharyngeal Nasopharyngeal Swab     Status: None   Collection Time: 06/26/20 11:29 AM   Specimen: Nasopharyngeal Swab  Result Value Ref Range Status   SARS Coronavirus 2 NEGATIVE NEGATIVE Final    Comment: (NOTE) SARS-CoV-2 target nucleic acids are NOT DETECTED.  The SARS-CoV-2 RNA is generally detectable in upper and lower respiratory specimens during the acute phase of infection. The lowest concentration of SARS-CoV-2 viral copies this assay can detect is 250 copies / mL. A negative result does not preclude SARS-CoV-2 infection and should not be used as the sole basis for treatment or other patient management decisions.  A negative result may occur with improper specimen collection / handling, submission of specimen other than nasopharyngeal swab, presence of viral mutation(s) within the areas targeted by this assay, and inadequate number of viral copies (<250 copies / mL). A negative result must be combined with clinical observations, patient history, and epidemiological information.  Fact Sheet for Patients:   StrictlyIdeas.no  Fact Sheet for Healthcare Providers: BankingDealers.co.za  This test is not yet approved or  cleared by the [place] FDA and has been authorized for detection and/or diagnosis of SARS-CoV-2 by FDA under an Emergency Use Authorization (EUA).  This EUA will remain in effect (meaning this test can be used) for the duration of  the COVID-19 declaration under Section 564(b)(1) of the Act, 21 U.S.C. section 360bbb-3(b)(1), unless the authorization is terminated or revoked sooner.  Performed at [facility], [place] [address], [place], [place] [address]   Sputum culture     Status: None   Collection Time: 06/26/20  6:18 PM   Specimen: Sputum  Result Value Ref Range Status   Specimen Description SPU  Final   Special Requests NONE  Final   Sputum evaluation   Final    THIS SPECIMEN IS ACCEPTABLE FOR SPUTUM CULTURE Performed at [facility], [place] [address], [place], [place] [address]    Report Status 06/26/2020 FINAL  Final  Culture, respiratory     Status: None (Preliminary result)   Collection Time: 06/26/20  6:18 PM   Specimen: Sputum  Result Value Ref Range Status   Specimen Description   Final    SPU Performed at [facility]  [place] [address], [place], [place] [address]    Special Requests   Final    NONE Reflexed from [id number] Performed at [place] [address], [place], [place] [address]    Gram Stain   Final    RARE WBC PRESENT, PREDOMINANTLY PMN RARE GRAM NEGATIVE RODS RARE GRAM POSITIVE COCCI IN PAIRS    Culture   Final    CULTURE REINCUBATED FOR BETTER GROWTH Performed at [facility], [place] [address] [place], [place] [address]    Report Status PENDING  Incomplete         Radiology Studies: No results found.      Scheduled Meds: . acidophilus  1 capsule Oral Daily  . atorvastatin  10 mg Oral Daily  . enoxaparin  120 mg Subcutaneous Q12H  . escitalopram  20 mg Oral Daily  . fluticasone  1 spray Each Nare Daily  . irbesartan  300 mg Oral Daily   And  . hydrochlorothiazide  25 mg Oral Daily  . pantoprazole  40 mg Oral BID   Continuous Infusions: . azithromycin 500 mg (06/28/20 1441)  . cefTRIAXone (ROCEPHIN)  IV 2 g (06/28/20 1331)     LOS: 2 days        Hosie Poisson, MD [facility]   To contact the attending provider between 7A-7P or the covering provider during after hours 7P-7A, please log into the web site [email or web address] and access using universal [place] password for that web site. If you do not have the password, please call the hospital operator.  06/28/2020, 5:51 PM

## 2020-06-28 NOTE — Progress Notes (Signed)
Pt maintained sat 94% off of O2 for >30 minutes. Sitting on the side of bed eating. No s/sx related to resp compromise noted. Pt remains off of O2. Cont with plan if care

## 2020-06-28 NOTE — Progress Notes (Signed)
1500-present........ assumed care of patient. Condition stable. No s/sx related to resp compromised noted. O2 at 1L.... Will work on weaning pt off of o2/ Pt to call if SOB noted. Cont with plan of care

## 2020-06-29 LAB — CULTURE, RESPIRATORY W GRAM STAIN: Culture: NORMAL

## 2020-06-29 MED ORDER — SODIUM CHLORIDE 0.9 % IV SOLN
2.0000 g | Freq: Once | INTRAVENOUS | Status: AC
  Administered 2020-06-29: 2 g via INTRAVENOUS
  Filled 2020-06-29: qty 2

## 2020-06-29 MED ORDER — AZITHROMYCIN 250 MG PO TABS
500.0000 mg | ORAL_TABLET | Freq: Once | ORAL | Status: DC
  Filled 2020-06-29 (×2): qty 2

## 2020-06-29 MED ORDER — AMOXICILLIN-POT CLAVULANATE 875-125 MG PO TABS
1.0000 | ORAL_TABLET | Freq: Two times a day (BID) | ORAL | 0 refills | Status: AC
Start: 2020-06-30 — End: 2020-07-04

## 2020-07-01 NOTE — Discharge Summary (Signed)
Physician Discharge Summary  Wesley Hancock PPI:[id number] DOB: [birth year] DOA: 06/26/2020  PCP: Inda Coke, PA  Admit date: 06/26/2020 Discharge date: 06/29/2020  Admitted From: Home.  Disposition: Home.   Recommendations for Outpatient Follow-up:  1. Follow up with PCP in 1-2 weeks 2. Please obtain BMP/CBC in one week 3. Please follow up with pulmonology in 1 to 2 weeks.  4. Please follow up with hematology.    Discharge Condition:stable.  CODE STATUS: FULL CODE.  Diet recommendation: Heart Healthy   Brief/Interim Summary: 49 year old male with prior history of antiphospholipid syndrome with history of DVT and PE on Lovenox, GERD, depression, hypertension, hyperlipidemia presents to ED for evaluation of shortness of breath.  Patient follows up with Dr. Melvyn Novas as an outpatient.  CT angiogram of the chest ruled out PE but showed multifocal pneumonia.  With mediastinal or hilar adenopathy. Patient seen and examined at bedside today patient reports his breathing and cough is improved and no hemoptysis so far.  Discharge Diagnoses:  Active Problems:   Essential hypertension   GAD (generalized anxiety disorder)   Mixed hyperlipidemia   Depression   Acute respiratory failure with hypoxia ([name])   Antiphospholipid syndrome ([name])   Pneumonia  Multi focal pneumonia/acute respiratory failure with hypoxia currently on 2 L of nasal cannula oxygen. Improving Started the patient on IV Rocephin and Zithromax transitioned to oral antibiotics to complete the course.  Weaned off oxygen.  Urine streptococcal antigen negative.  Legionella antigen is negative.  Follow blood cultures.  Sputum cultures normal. CT angiogram is negative for PE. Weaned off oxygen.    Hemoptysis probably secondary to multifocal pneumonia.  No hemoptysis in the last 24 hours.   AKI Probably secondary to dehydration and pneumonia Resolved with IV fluids.    GERD Stable    Essential  hypertension Blood pressure parameters are very well controlled.    Antiphospholipid syndrome with prior history of DVT and PE Continue with Lovenox.        Discharge Instructions  Discharge Instructions    Diet - low sodium heart healthy   Complete by: As directed    Discharge instructions   Complete by: As directed    Please follow up with PCP in one week.  Please follow up with hematology in 2 weeks.     Allergies as of 06/29/2020      Reactions   Diltiazem Hcl Diarrhea, Other (See Comments)   Lethargic       Medication List    TAKE these medications   acetaminophen 500 MG tablet Commonly known as: TYLENOL Take 1,000 mg by mouth every 6 (six) hours as needed for mild pain.   amoxicillin-clavulanate 875-125 MG tablet Commonly known as: Augmentin Take 1 tablet by mouth every 12 (twelve) hours for 4 days.   atorvastatin 10 MG tablet Commonly known as: LIPITOR Take 1 tablet (10 mg total) by mouth daily.   enoxaparin 120 MG/0.8ML injection Commonly known as: LOVENOX Inject 0.8 mLs (120 mg total) into the skin every 12 (twelve) hours.   escitalopram 20 MG tablet Commonly known as: LEXAPRO Take 1 tablet (20 mg total) by mouth daily.   fluticasone 50 MCG/ACT nasal spray Commonly known as: FLONASE Place 1 spray into both nostrils daily.   nitroGLYCERIN 0.4 MG SL tablet Commonly known as: NITROSTAT Place 1 tablet (0.4 mg total) under the tongue every 5 (five) minutes as needed for chest pain.   pantoprazole 40 MG tablet Commonly known as: Protonix Take 1 tablet (40 mg total)  by mouth 2 (two) times daily.   phenylephrine 10 MG Tabs tablet Commonly known as: SUDAFED PE Take 10 mg by mouth every 4 (four) hours as needed (congestion).   PROBIOTIC PO Take 1 capsule by mouth daily.   telmisartan-hydrochlorothiazide 80-25 MG tablet Commonly known as: MICARDIS HCT Take 1 tablet by mouth daily.       Follow-up Information    Inda Coke,  [place]. Schedule an appointment as soon as possible for a visit in 1 week(s).   Specialty: Physician Assistant Contact information: [place] [address] [phone]              Allergies  Allergen Reactions  . Diltiazem Hcl Diarrhea and Other (See Comments)    Lethargic     Consultations:  None.    Procedures/Studies: DG Chest 2 View  Result Date: 06/26/2020 CLINICAL DATA:  Shortness of breath, history of prior blood clots EXAM: CHEST - 2 VIEW COMPARISON:  May 30, 2020 FINDINGS: Trachea midline. The cardiomediastinal contours and hilar structures are normal. Patchy bilateral opacities in the chest. Similar, perhaps slightly more confluent when compared to the study of May 30, 2020. Visualized skeletal structures on limited assessment are unremarkable. IMPRESSION: Patchy bilateral opacities in the chest, perhaps slightly more confluent when compared to the study of May 30, 2020. Findings remain concerning for multifocal infection, correlate with any signs of infection. Would also correlate with any risk factors or laboratory evidence of COVID infection based on distribution and appearance. Suggest follow-up chest CT in 3 months to ensure resolution or track for changes. Electronically Signed   By: Zetta Bills M.D.   On: 06/26/2020 11:59   CT Angio Chest PE W/Cm &/Or Wo Cm  Result Date: 06/26/2020 CLINICAL DATA:  Short of breath upon awakening this morning. Coughing up some blood. Patient reports taking blood thinners. EXAM: CT ANGIOGRAPHY CHEST WITH CONTRAST TECHNIQUE: Multidetector CT imaging of the chest was performed using the standard protocol during bolus administration of intravenous contrast. Multiplanar CT image reconstructions and MIPs were obtained to evaluate the vascular anatomy. CONTRAST:  64mL OMNIPAQUE IOHEXOL 350 MG/ML SOLN COMPARISON:  05/30/2020 FINDINGS: Cardiovascular: Study somewhat limited due to respiratory motion. Allowing for this, there is  no evidence of a pulmonary embolism. Heart is normal in size. No pericardial effusion. Mild three-vessel coronary artery calcifications. Aorta is normal caliber. No dissection. No atherosclerosis. Mediastinum/Nodes: Several prominent mediastinal lymph nodes. Are 2 pre-vascular nodes measuring 1 cm short axis. Right paratracheal node just above the azygos arch measures 1.3 cm in short axis. No neck base or axillary masses or enlarged lymph nodes. Mild bilateral hilar adenopathy, largest node on the right measuring 1.2 cm in short axis. Trachea and esophagus are unremarkable. Lungs/Pleura: Bilateral patchy airspace lung opacities, which are greater on the right, have increased when compared to the prior chest CT. No pleural effusion or pneumothorax. Upper Abdomen: No acute findings. Diffuse decreased attenuation of the liver consistent hepatic steatosis. Musculoskeletal: No fracture or acute finding. No osteoblastic or osteolytic lesions. Review of the MIP images confirms the above findings. IMPRESSION: 1. No evidence of a pulmonary embolism. Study mildly degraded by respiratory motion. 2. Multifocal pneumonia. Lung infiltrates have increased when compared to the prior chest CT. There is associated mediastinal and hilar adenopathy, stable from the prior CT. Electronically Signed   By: Lajean Manes M.D.   On: 06/26/2020 12:57     Subjective: No new complaints.   Discharge Exam: Vitals:   06/28/20  2115 06/29/20 0520  BP: (!) 113/63 (!) 146/95  Pulse: 87 88  Resp: 20 18  Temp: 99.7 F (37.6 C) 98.3 F (36.8 C)  SpO2: 94% 97%   Vitals:   06/28/20 0647 06/28/20 1357 06/28/20 2115 06/29/20 0520  BP: (!) 146/99 122/81 (!) 113/63 (!) 146/95  Pulse: 81 68 87 88  Resp: 18 16 20 18   Temp: 97.9 F (36.6 C) 97.8 F (36.6 C) 99.7 F (37.6 C) 98.3 F (36.8 C)  TempSrc:  Oral Oral Oral  SpO2: 96% 94% 94% 97%  Weight:      Height:        General: Pt is alert, awake, not in acute  distress Cardiovascular: RRR, S1/S2 +, no rubs, no gallops Respiratory: CTA bilaterally, no wheezing, no rhonchi Abdominal: Soft, NT, ND, bowel sounds + Extremities: no edema, no cyanosis    The results of significant diagnostics from this hospitalization (including imaging, microbiology, ancillary and laboratory) are listed below for reference.     Microbiology: Recent Results (from the past 240 hour(s))  SARS Coronavirus 2 by RT PCR (hospital order, performed in [facility] hospital lab) Nasopharyngeal Nasopharyngeal Swab     Status: None   Collection Time: 06/26/20 11:29 AM   Specimen: Nasopharyngeal Swab  Result Value Ref Range Status   SARS Coronavirus 2 NEGATIVE NEGATIVE Final    Comment: (NOTE) SARS-CoV-2 target nucleic acids are NOT DETECTED.  The SARS-CoV-2 RNA is generally detectable in upper and lower respiratory specimens during the acute phase of infection. The lowest concentration of SARS-CoV-2 viral copies this assay can detect is 250 copies / mL. A negative result does not preclude SARS-CoV-2 infection and should not be used as the sole basis for treatment or other patient management decisions.  A negative result may occur with improper specimen collection / handling, submission of specimen other than nasopharyngeal swab, presence of viral mutation(s) within the areas targeted by this assay, and inadequate number of viral copies (<250 copies / mL). A negative result must be combined with clinical observations, patient history, and epidemiological information.  Fact Sheet for Patients:   StrictlyIdeas.no  Fact Sheet for Healthcare Providers: BankingDealers.co.za  This test is not yet approved or  cleared by the [place] FDA and has been authorized for detection and/or diagnosis of SARS-CoV-2 by FDA under an Emergency Use Authorization (EUA).  This EUA will remain in effect (meaning this test can be used) for  the duration of the COVID-19 declaration under Section 564(b)(1) of the Act, 21 U.S.C. section 360bbb-3(b)(1), unless the authorization is terminated or revoked sooner.  Performed at [facility], [place] [address] [place], [place] [address]   Sputum culture     Status: None   Collection Time: 06/26/20  6:18 PM   Specimen: Sputum  Result Value Ref Range Status   Specimen Description SPU  Final   Special Requests NONE  Final   Sputum evaluation   Final    THIS SPECIMEN IS ACCEPTABLE FOR SPUTUM CULTURE Performed at [facility], [place] [address] [place], [place] [address]    Report Status 06/26/2020 FINAL  Final  Culture, respiratory     Status: None   Collection Time: 06/26/20  6:18 PM   Specimen: Sputum  Result Value Ref Range Status   Specimen Description   Final    SPU Performed at [place] [address] [place], [place] [address]    Special Requests   Final  NONE Reflexed from [id number] Performed at [facility], [place] [address] [place], [place] [address]    Gram Stain   Final    RARE WBC PRESENT, PREDOMINANTLY PMN RARE GRAM NEGATIVE RODS RARE GRAM POSITIVE COCCI IN PAIRS    Culture   Final    Consistent with normal respiratory flora. Performed at [facility], [place] [address] [place], [place] [address]    Report Status 06/29/2020 FINAL  Final     Labs: BNP (last 3 results) Recent Labs    08/30/19 1245  BNP 672.0*   Basic Metabolic Panel: Recent Labs  Lab 06/26/20 1115 06/27/20 0521  NA 136 138  K 4.1 3.8  CL 98 102  CO2 27 28  GLUCOSE 91 113*  BUN 20 16  CREATININE 1.28* 1.10  CALCIUM 9.3 9.5   Liver Function Tests: Recent Labs  Lab 06/26/20 1115 06/27/20 0521  AST 25 26  ALT 50* 48*  ALKPHOS 42 44  BILITOT 1.1 0.7  PROT 7.6 7.6  ALBUMIN 4.1 4.1   No results for input(s): LIPASE, AMYLASE in the last 168 hours. No results for input(s): AMMONIA in the  last 168 hours. CBC: Recent Labs  Lab 06/26/20 1115 06/27/20 0521  WBC 8.7 5.3  NEUTROABS 6.5  --   HGB 14.5 14.2  HCT 43.6 42.4  MCV 91.0 90.0  PLT 222 226   Cardiac Enzymes: No results for input(s): CKTOTAL, CKMB, CKMBINDEX, TROPONINI in the last 168 hours. BNP: Invalid input(s): POCBNP CBG: No results for input(s): GLUCAP in the last 168 hours. D-Dimer No results for input(s): DDIMER in the last 72 hours. Hgb A1c No results for input(s): HGBA1C in the last 72 hours. Lipid Profile No results for input(s): CHOL, HDL, LDLCALC, TRIG, CHOLHDL, LDLDIRECT in the last 72 hours. Thyroid function studies No results for input(s): TSH, T4TOTAL, T3FREE, THYROIDAB in the last 72 hours.  Invalid input(s): FREET3 Anemia work up No results for input(s): VITAMINB12, FOLATE, FERRITIN, TIBC, IRON, RETICCTPCT in the last 72 hours. Urinalysis    Component Value Date/Time   COLORURINE YELLOW 06/21/2017 0620   APPEARANCEUR CLEAR 06/21/2017 0620   LABSPEC 1.023 06/21/2017 0620   PHURINE 5.0 06/21/2017 0620   GLUCOSEU NEGATIVE 06/21/2017 0620   HGBUR NEGATIVE 06/21/2017 0620   BILIRUBINUR NEGATIVE 06/21/2017 0620   KETONESUR NEGATIVE 06/21/2017 0620   PROTEINUR NEGATIVE 06/21/2017 0620   NITRITE NEGATIVE 06/21/2017 0620   LEUKOCYTESUR NEGATIVE 06/21/2017 0620   Sepsis Labs Invalid input(s): PROCALCITONIN,  WBC,  LACTICIDVEN Microbiology Recent Results (from the past 240 hour(s))  SARS Coronavirus 2 by RT PCR (hospital order, performed in [facility] hospital lab) Nasopharyngeal Nasopharyngeal Swab     Status: None   Collection Time: 06/26/20 11:29 AM   Specimen: Nasopharyngeal Swab  Result Value Ref Range Status   SARS Coronavirus 2 NEGATIVE NEGATIVE Final    Comment: (NOTE) SARS-CoV-2 target nucleic acids are NOT DETECTED.  The SARS-CoV-2 RNA is generally detectable in upper and lower respiratory specimens during the acute phase of infection. The lowest concentration of  SARS-CoV-2 viral copies this assay can detect is 250 copies / mL. A negative result does not preclude SARS-CoV-2 infection and should not be used as the sole basis for treatment or other patient management decisions.  A negative result may occur with improper specimen collection / handling, submission of specimen other than nasopharyngeal swab, presence of viral mutation(s) within the areas targeted by this assay, and inadequate number of viral copies (<  250 copies / mL). A negative result must be combined with clinical observations, patient history, and epidemiological information.  Fact Sheet for Patients:   StrictlyIdeas.no  Fact Sheet for Healthcare Providers: BankingDealers.co.za  This test is not yet approved or  cleared by the [place] FDA and has been authorized for detection and/or diagnosis of SARS-CoV-2 by FDA under an Emergency Use Authorization (EUA).  This EUA will remain in effect (meaning this test can be used) for the duration of the COVID-19 declaration under Section 564(b)(1) of the Act, 21 U.S.C. section 360bbb-3(b)(1), unless the authorization is terminated or revoked sooner.  Performed at [facility], [place] [address], [place], [place] [address]   Sputum culture     Status: None   Collection Time: 06/26/20  6:18 PM   Specimen: Sputum  Result Value Ref Range Status   Specimen Description SPU  Final   Special Requests NONE  Final   Sputum evaluation   Final    THIS SPECIMEN IS ACCEPTABLE FOR SPUTUM CULTURE Performed at [facility], [place] [address] [place], [place] [address]    Report Status 06/26/2020 FINAL  Final  Culture, respiratory     Status: None   Collection Time: 06/26/20  6:18 PM   Specimen: Sputum  Result Value Ref Range Status   Specimen Description   Final    SPU Performed at [place] [address], [place], [place] [address]     Special Requests   Final    NONE Reflexed from [id number] Performed at [place] [address], [place], [place] [address]    Gram Stain   Final    RARE WBC PRESENT, PREDOMINANTLY PMN RARE GRAM NEGATIVE RODS RARE GRAM POSITIVE COCCI IN PAIRS    Culture   Final    Consistent with normal respiratory flora. Performed at [facility], [place] [address] [place], [place] [address]    Report Status 06/29/2020 FINAL  Final     Time coordinating discharge: 36 minutes.   SIGNED:   Hosie Poisson, MD  [facility]

## 2020-07-08 ENCOUNTER — Other Ambulatory Visit: Payer: Self-pay

## 2020-07-08 ENCOUNTER — Ambulatory Visit: Payer: BC Managed Care – PPO | Admitting: Physician Assistant

## 2020-07-08 ENCOUNTER — Encounter: Payer: Self-pay | Admitting: Physician Assistant

## 2020-07-08 VITALS — BP 114/78 | HR 110 | Temp 97.9°F | Ht 73.0 in | Wt 289.2 lb

## 2020-07-08 DIAGNOSIS — J189 Pneumonia, unspecified organism: Secondary | ICD-10-CM | POA: Diagnosis not present

## 2020-07-08 DIAGNOSIS — I2694 Multiple subsegmental pulmonary emboli without acute cor pulmonale: Secondary | ICD-10-CM | POA: Diagnosis not present

## 2020-07-08 NOTE — Progress Notes (Signed)
Wesley Hancock is a 49 y.o. male is here to discuss: hospital follow up  I acted as a Education administrator for [employer], PA-C Serita Sheller, [place]  History of Present Illness:   Chief Complaint  Patient presents with  . Hospitalization Follow-up    HPI  ED follow up Pt was seen in the ED on 06/26/2020 for multifocal pneumonia and hypoxia. CT angio was done that ruled out PE but did confirm multifocal PNA. He completed IV rocephin and azithromycin and was weaned off oxygen prior to d/c.  Overall he is doing fair. He denies concerns with severe SOB, but is concerned because he has had two recent hospitalizations for his ongoing lung issues but is not getting concrete answers about why he continues to fight PNA and hypoxia.   Planning to see a cardiologist and hematologist later this week.    Health Maintenance Due  Topic Date Due  . Hepatitis C Screening  Never done  . INFLUENZA VACCINE  06/30/2020    Past Medical History:  Diagnosis Date  . Anxiety   . APS (antiphospholipid syndrome) ([place])   . Arthritis   . Colon polyp    ? hyperplastic  . Depression   . DVT (deep venous thrombosis) ([place])   . Esophageal spasm   . HTN (hypertension)   . Hyperlipidemia   . Nutcracker esophagus   . Pneumonia   . Pulmonary embolism ([name])      Social History   Tobacco Use  . Smoking status: Former Smoker    Packs/day: 1.00    Years: 20.00    Pack years: 20.00    Types: Cigarettes    Quit date: 11/30/2013    Years since quitting: 6.6  . Smokeless tobacco: Former Systems developer    Types: Snuff    Quit date: 05/21/2018  Vaping Use  . Vaping Use: Never used  Substance Use Topics  . Alcohol use: Yes    Alcohol/week: 5.0 standard drinks    Types: 5 Cans of beer per week    Comment: 1 per day  . Drug use: No    Past Surgical History:  Procedure Laterality Date  . CHOLECYSTECTOMY      Family History  Problem Relation Age of Onset  . Diabetes Mother   . Hyperlipidemia Mother   .  Hypertension Mother   . Heart disease Father 46  . Hyperlipidemia Father   . Hypertension Father   . Prostate cancer Father 61  . Lung cancer Father 49       Dx 06/18/2017  . Colon polyps Father   . Irritable bowel syndrome Father   . Diverticulitis Father   . Diabetes Sister   . Hyperlipidemia Sister   . Hypertension Sister   . Diabetes Maternal Grandmother   . Heart disease Maternal Grandmother   . Hyperlipidemia Maternal Grandmother   . Hypertension Maternal Grandmother   . Colon cancer Maternal Grandmother   . Heart disease Maternal Grandfather   . Hyperlipidemia Maternal Grandfather   . Hypertension Maternal Grandfather   . Stroke Maternal Grandfather   . Liver cancer Maternal Grandfather   . Irritable bowel syndrome Maternal Grandfather   . Heart disease Paternal Grandmother     PMHx, SurgHx, SocialHx, FamHx, Medications, and Allergies were reviewed in the Visit Navigator and updated as appropriate.   Patient Active Problem List   Diagnosis Date Noted  . Pneumonia 06/26/2020  . Diarrhea 05/21/2020  . Upper airway cough syndrome 01/05/2020  . Paresthesia of right  foot 11/08/2019  . Antiphospholipid syndrome ([place]) 10/30/2019  . Pulmonary infiltrates 09/10/2019  . Morbid obesity due to excess calories ([place]) c/b hbp/ dvt/PE 09/10/2019  . Acute nonintractable headache 09/06/2019  . Lobar pneumonia ([place]) 08/30/2019  . Acute respiratory failure with hypoxia ([place]) 08/30/2019  . AKI (acute kidney injury) ([place]) 08/30/2019  . DVT (deep venous thrombosis) ([place]) 08/30/2019  . Multiple pulmonary emboli ([place]) 08/22/2019  . Dyspnea on exertion 09/08/2017  . Depression 06/24/2017  . GAD (generalized anxiety disorder) 08/06/2016  . Mixed hyperlipidemia 09/18/2015  . Essential hypertension 09/23/2014  . Nutcracker esophagus 09/21/2014  . Lung nodule 09/21/2014    Social History   Tobacco Use  . Smoking status: Former Smoker    Packs/day: 1.00    Years: 20.00    Pack  years: 20.00    Types: Cigarettes    Quit date: 11/30/2013    Years since quitting: 6.6  . Smokeless tobacco: Former Systems developer    Types: Snuff    Quit date: 05/21/2018  Vaping Use  . Vaping Use: Never used  Substance Use Topics  . Alcohol use: Yes    Alcohol/week: 5.0 standard drinks    Types: 5 Cans of beer per week    Comment: 1 per day  . Drug use: No    Current Medications and Allergies:    Current Outpatient Medications:  .  acetaminophen (TYLENOL) 500 MG tablet, Take 1,000 mg by mouth every 6 (six) hours as needed for mild pain., Disp: , Rfl:  .  atorvastatin (LIPITOR) 10 MG tablet, Take 1 tablet (10 mg total) by mouth daily., Disp: 30 tablet, Rfl: 5 .  enoxaparin (LOVENOX) 120 MG/0.8ML injection, Inject 0.8 mLs (120 mg total) into the skin every 12 (twelve) hours., Disp: 80 mL, Rfl: 1 .  escitalopram (LEXAPRO) 20 MG tablet, Take 1 tablet (20 mg total) by mouth daily., Disp: 90 tablet, Rfl: 2 .  fluticasone (FLONASE) 50 MCG/ACT nasal spray, Place 1 spray into both nostrils daily. , Disp: , Rfl:  .  nitroGLYCERIN (NITROSTAT) 0.4 MG SL tablet, Place 1 tablet (0.4 mg total) under the tongue every 5 (five) minutes as needed for chest pain., Disp: 15 tablet, Rfl: 2 .  pantoprazole (PROTONIX) 40 MG tablet, Take 1 tablet (40 mg total) by mouth 2 (two) times daily., Disp: 180 tablet, Rfl: 1 .  Probiotic Product (PROBIOTIC PO), Take 1 capsule by mouth daily., Disp: , Rfl:  .  telmisartan-hydrochlorothiazide (MICARDIS HCT) 80-25 MG tablet, Take 1 tablet by mouth daily., Disp: 90 tablet, Rfl: 2 .  phenylephrine (SUDAFED PE) 10 MG TABS tablet, Take 10 mg by mouth every 4 (four) hours as needed (congestion). (Patient not taking: Reported on 07/08/2020), Disp: , Rfl:    Allergies  Allergen Reactions  . Diltiazem Hcl Diarrhea and Other (See Comments)    Lethargic     Review of Systems   ROS  Negative unless otherwise specified per HPI.  Vitals:   Vitals:   07/08/20 0802  BP: 114/78    Pulse: (!) 110  Temp: 97.9 F (36.6 C)  TempSrc: Temporal  SpO2: 94%  Weight: 289 lb 3.2 oz (131.2 kg)  Height: 6\' 1"  (1.854 m)     Body mass index is 38.16 kg/m.   Physical Exam:    Physical Exam Vitals and nursing note reviewed.  Constitutional:      General: He is not in acute distress.    Appearance: He is well-developed. He is not ill-appearing or toxic-appearing.  Cardiovascular:     Rate and Rhythm: Regular rhythm. Tachycardia present.     Pulses: Normal pulses.     Heart sounds: Normal heart sounds, S1 normal and S2 normal.     Comments: No LE edema Pulmonary:     Effort: Pulmonary effort is normal.     Breath sounds: Normal breath sounds.  Skin:    General: Skin is warm and dry.  Neurological:     Mental Status: He is alert.     GCS: GCS eye subscore is 4. GCS verbal subscore is 5. GCS motor subscore is 6.  Psychiatric:        Speech: Speech normal.        Behavior: Behavior normal. Behavior is cooperative.      Assessment and Plan:    Kaycen was seen today for hospitalization follow-up.  Diagnoses and all orders for this visit:  Multiple subsegmental pulmonary emboli without acute cor pulmonale ([place]); Multifocal pneumonia Will refer to [facility] pulmonary for further evaluation and management and second opinion, per patient request.  We reviewed his hospitalization records together and plans for work-up with specialists (cards and hematology.)   . Reviewed expectations re: course of current medical issues. . Discussed self-management of symptoms. . Outlined signs and symptoms indicating need for more acute intervention. . Patient verbalized understanding and all questions were answered. . See orders for this visit as documented in the electronic medical record. . Patient received an After Visit Summary.  CMA or LPN served as scribe during this visit. History, Physical, and Plan performed by medical provider. The above documentation has been  reviewed and is accurate and complete.  Inda Coke, PA-C [place], [place] 07/08/2020   Time spent with patient today was 25 minutes which consisted of chart review, discussing diagnosis, work up, treatment answering questions and documentation.   Follow-up: No follow-ups on file.

## 2020-07-08 NOTE — Patient Instructions (Signed)
It was great to see you!  I am putting in referral for you to see a new pulmonologist [facility]) -- someone should be in touch with you soon regarding this.  Bring new FMLA paper work by if you need.  Keep me posted on your mood.  Take care,  Inda Coke PA-C

## 2020-07-09 DIAGNOSIS — R0681 Apnea, not elsewhere classified: Secondary | ICD-10-CM | POA: Insufficient documentation

## 2020-07-09 DIAGNOSIS — J189 Pneumonia, unspecified organism: Secondary | ICD-10-CM

## 2020-07-09 DIAGNOSIS — Z86711 Personal history of pulmonary embolism: Secondary | ICD-10-CM | POA: Insufficient documentation

## 2020-07-09 DIAGNOSIS — R0683 Snoring: Secondary | ICD-10-CM | POA: Insufficient documentation

## 2020-07-09 DIAGNOSIS — K220 Achalasia of cardia: Secondary | ICD-10-CM | POA: Diagnosis not present

## 2020-07-09 DIAGNOSIS — K219 Gastro-esophageal reflux disease without esophagitis: Secondary | ICD-10-CM | POA: Diagnosis not present

## 2020-07-09 DIAGNOSIS — K224 Dyskinesia of esophagus: Secondary | ICD-10-CM | POA: Diagnosis not present

## 2020-07-09 HISTORY — DX: Pneumonia, unspecified organism: J18.9

## 2020-07-09 HISTORY — DX: Snoring: R06.83

## 2020-07-09 HISTORY — DX: Apnea, not elsewhere classified: R06.81

## 2020-07-11 ENCOUNTER — Encounter: Payer: Self-pay | Admitting: *Deleted

## 2020-07-11 DIAGNOSIS — N182 Chronic kidney disease, stage 2 (mild): Secondary | ICD-10-CM | POA: Insufficient documentation

## 2020-07-11 NOTE — Progress Notes (Addendum)
Cardiology Office Note:    Date:  07/12/2020   ID:  Wesley Hancock, DOB 1971/08/11, MRN [id number]  PCP:  Inda Coke, PA  Cardiologist:  Shirlee More, MD   Referring MD: Inda Coke, [place]  ASSESSMENT:    1. SOB (shortness of breath)   2. Coronary artery calcification seen on CAT scan   3. Essential hypertension   4. History of pulmonary embolus (PE)   5. Chronic anticoagulation   6. CKD (chronic kidney disease) stage 2, GFR 60-89 ml/min   7. Mixed hyperlipidemia   8. Chest pressure    PLAN:    In order of problems listed above:  1. He continues short of breath which seems disproportionate to the small volume of pulmonary embolism he had in his improvement treatment of his recurrent pneumonia.  He certainly is at risk for sleep apnea and is in the process of having testing and likely requires positive pressure support.  His proBNP level was elevated in the hospital for further evaluation we will recheck now that his acute illness is resolved an echocardiogram to look at systolic diastolic function and pulmonary pressures.  At this time I would not put him on a diuretic. 2. He is coronary artery calcification on CT scan increasing his cardiovascular risk goal LDL is less than 100 ideally less than 70 will recheck his lipid profile along with LP(a) with a strong family history.  For an ischemia evaluation were no do a myocardial perfusion study because of his history of acute kidney injury with CTA of the chest. 3. Continue current anticoagulation managed by hematology 4. Stable recent creatinine was normal if perfusion study is abnormal he will need coronary angiography.   5. Continue his statin recheck lipid profile LP(a) likely needs a second agent either Zetia or PCSK9 inhibitor Multiple potential etiologies including his esophageal motility disorder he will be set up for myocardial perfusion study and if it is abnormal coronary angiography. MPI 09/20/2015 was normal.NM  CARD SINGLE REST OR STRESS  Impression  IMPRESSION:   1. There are no significant perfusion defects suggestive of ischemia or scar.   2. Normal systolic motion and regional wall thickening  3. Left ventricular systolic function is normal with estimated LVEF of 67%.     Next appointment he will follow-up with me or Dr.Tobb in 6 weeks or sooner for his perfusion study or echocardiogram shows significant findings   Medication Adjustments/Labs and Tests Ordered: Current medicines are reviewed at length with the patient today.  Concerns regarding medicines are outlined above.  Orders Placed This Encounter  Procedures  . Pro b natriuretic peptide (BNP)  . Lipoprotein A (LPA)  . Lipid Profile  . MYOCARDIAL PERFUSION IMAGING  . ECHOCARDIOGRAM COMPLETE   Meds ordered this encounter  Medications  . atorvastatin (LIPITOR) 10 MG tablet    Sig: Take 1 tablet (10 mg total) by mouth daily.    Dispense:  90 tablet    Refill:  1     Chief Complaint  Patient presents with  . Shortness of Breath    I am quite concerned with the family history of CAD  . Chest Pain    History of Present Illness:    Wesley Hancock is a 49 y.o. male with a history of pulmonary embolism, APS, pneumonia with hypoxemia and AKI, hypertension,who is being seen today for the evaluation of SOB at the request of Inda Coke, [place].  Maintained on long-term enoxaparin for antiphospholipid syndrome and protein S deficiency with  venous thromboembolism. Is a very complicated visit including review of hospital records testing and multiple physicians evaluation including the most recent pulmonary second opinion 07/09/2020.  Chart review includes pulmonary consultation Dr. Melvyn Novas, hospitalization records from September 2020 in July 2021, imaging including 2 CTAs initially with pulmonary emboli most recent with multifocal pneumonia chest x-ray showing persistent pulmonary infiltrates no findings of heart failure, and  laboratory studies and she including normal troponins and elevated BNP level.  He does have coronary artery calcification.  His pulmonary function test are described as normal with only mild restriction and no response to bronco dilator.  EKG 06/26/2020 showed sinus rhythm and was normal independently reviewed  Chest CTA 08/30/2019: IMPRESSION: Small bilateral lower lobe pulmonary emboli, left greater than right. No evidence of right heart strain.  Moderate left pleural effusion.  Extensive bilateral airspace opacities out of proportion to the pulmonary emboli in the lower lobes most compatible with multifocal Pneumonia.   CXR 06/26/2020: IMPRESSION: Small bilateral lower lobe pulmonary emboli, left greater than right. No evidence of right heart strain.  Moderate left pleural effusion.  Extensive bilateral airspace opacities out of proportion to the pulmonary emboli in the lower lobes most compatible with multifocal Pneumonia.  CTA chest 05/30/2020: IMPRESSION: No evidence of pulmonary embolus. Previously seen pulmonary emboli have resolved.  Patchy scattered ground-glass airspace opacities, most pronounced in the lower lobes concerning for pneumonia.  Cardiomegaly.  Borderline sized bilateral hilar and mediastinal lymph nodes are stable since prior study, likely reactive.  Coronary artery disease.   2 wk ago  (06/26/20) 2 wk ago  (06/26/20) 1 mo ago  (05/30/20)   Troponin I (High Sensitivity) <18 ng/L 3  3 CM  6 CM     Ref Range & Units 10 mo ago  B Natriuretic Peptide 0.0 - 100.0 pg/mL 530.0High   EKG 06/26/2020: [facility] normal EKG   He has a strong family history of CAD father MI in his 83s bypass in his 30s and died in his 47s of heart failure.  His father also had a systemic embolism to his retina but no history of venous thromboembolism.  In the past he has been evaluated by cardiologist in [facility] he has had CTs of the chest he had a  stress test about 5 years ago and was not felt to have CAD.  He had a second opinion from pulmonary they think he may be having aspiration pneumonia related to his esophageal disease is advised to have cardiology evaluation.  He has variable shortness of breath mostly when he is outdoors hot humid weather incline or climbing stairs but no cough wheezing.  At times he has chest pressure nonexertional does not require Restoril relief and in the past he is taken nitroglycerin for his esophageal motility disorder but not in the last few years.  He does not have typical exertional angina he snores severely in the hospital he had low oxygen saturations during sleep and is pending an outpatient sleep test.  Recently was put on a PPI and is having worsened diarrhea.  He does not have orthopnea or PND.  No palpitations syncope no history of congenital or rheumatic heart disease or atrial fibrillation.  Up until last September he had no trouble with physical activity or shortness of breath. Past Medical History:  Diagnosis Date  . Acute nonintractable headache 09/06/2019  . Acute respiratory failure with hypoxia ([place]) 08/30/2019  . AKI (acute kidney injury) ([place]) 08/30/2019  . Antiphospholipid syndrome ([place]) 10/30/2019  .  Anxiety   . APS (antiphospholipid syndrome) ([place])   . Arthritis   . Colon polyp    ? hyperplastic  . Depression   . Diarrhea 05/21/2020  . DVT (deep venous thrombosis) ([place])   . Dyspnea on exertion 09/08/2017   "Quit smoking" 2015 with onset of symptoms in 2016  Spirometry 09/08/2017  Flat f/v loop  - d/c acei 09/08/2017  - 01/04/2020   Walked RA x two laps =  approx 531ft @ fast pace - stopped due to end of study/ min sob with sats of 93 % at the end of the study. - PFT's  03/08/20   FEV1 3.84 (83 % ) ratio 0..90  p 3 % improvement from saba p ? prior to study with DLCO  26.46 (77%) corrects to 4.76 (99%)    . Esophageal spasm   . Essential hypertension 09/23/2014   D/c acei 09/08/2017 due to  pseudocopd   . GAD (generalized anxiety disorder) 08/06/2016  . Lobar pneumonia ([place]) 08/30/2019  . Lung nodule 09/21/2014  . Mixed hyperlipidemia 09/18/2015  . Morbid obesity due to excess calories ([place]) c/b hbp/ dvt/PE 09/10/2019  . 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 Nutcracker esophagus   . Paresthesia of right foot 11/08/2019  . Pulmonary infiltrates 09/10/2019   In setting of bilateral PE 08/23/2019 with antiphospholipid syndrome   . Recurrent pneumonia 07/09/2020   Formatting of this note might be different from the original. 06/26/20, 03/08/20  . Snoring 07/09/2020  . Upper airway cough syndrome 01/05/2020   Onset mid Jan 2021 while on otc PPI  - max rx for gerd 01/04/2020 >>>     . Witnessed apneic spells 07/09/2020    Past Surgical History:  Procedure Laterality Date  . CHOLECYSTECTOMY      Current Medications: Current Meds  Medication Sig  . acetaminophen (TYLENOL) 500 MG tablet Take 1,000 mg by mouth every 6 (six) hours as needed for mild pain.  Marland Kitchen atorvastatin (LIPITOR) 10 MG tablet Take 1 tablet (10 mg total) by mouth daily.  Marland Kitchen enoxaparin (LOVENOX) 120 MG/0.8ML injection Inject 0.8 mLs (120 mg total) into the skin every 12 (twelve) hours.  Marland Kitchen escitalopram (LEXAPRO) 20 MG tablet Take 1 tablet (20 mg total) by mouth daily.  . fluticasone (FLONASE) 50 MCG/ACT nasal spray Place 1 spray into both nostrils daily.   . nitroGLYCERIN (NITROSTAT) 0.4 MG SL tablet Place 1 tablet (0.4 mg total) under the tongue every 5 (five) minutes as needed for chest pain.  . pantoprazole (PROTONIX) 40 MG tablet Take 1 tablet (40 mg total) by mouth 2 (two) times daily.  . phenylephrine (SUDAFED PE) 10 MG TABS tablet Take 10 mg by mouth every 4 (four) hours as needed (congestion).   . Probiotic Product (PROBIOTIC PO)  Take 1 capsule by mouth daily.  Marland Kitchen telmisartan-hydrochlorothiazide (MICARDIS HCT) 80-25 MG tablet Take 1 tablet by mouth daily.  . [DISCONTINUED] atorvastatin (LIPITOR) 10 MG tablet Take 1 tablet (10 mg total) by mouth daily.     Allergies:   Diltiazem hcl   Social History   Socioeconomic History  . Marital status: Married    Spouse name: Not on file  . Number of children: 3  . Years of education: Not on file  . Highest education level: Not on  file  Occupational History  . Occupation: Truck Geophysicist/field seismologist  Tobacco Use  . Smoking status: Former Smoker    Packs/day: 1.00    Years: 20.00    Pack years: 20.00    Types: Cigarettes    Quit date: 11/30/2013    Years since quitting: 6.6  . Smokeless tobacco: Former Systems developer    Types: Snuff    Quit date: 05/21/2018  Vaping Use  . Vaping Use: Never used  Substance and Sexual Activity  . Alcohol use: Yes    Alcohol/week: 5.0 standard drinks    Types: 5 Cans of beer per week    Comment: 1 per day  . Drug use: No  . Sexual activity: Yes  Other Topics Concern  . Not on file  Social History Narrative   Married (03/2016)   Lives in [place], [place] (Originally from [place] )   Some family in [place] live in [place].   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   Did play minor league baseball. ([place]   Likes to hunt, fish and plays in competitive soft ball league (slow pitch)   Interest includes trips to [employer] and collecting colorful socks.   Social Determinants of Health   Financial Resource Strain:   . Difficulty of Paying Living Expenses:   Food Insecurity:   . Worried About Charity fundraiser in the Last Year:   . Arboriculturist in the Last Year:   Transportation Needs:   . Film/video editor (Medical):   Marland Kitchen Lack of Transportation (Non-Medical):   Physical Activity:   . Days of Exercise per Week:   . Minutes of Exercise per Session:   Stress:   . Feeling of Stress :   Social  Connections:   . Frequency of Communication with Friends and Family:   . Frequency of Social Gatherings with Friends and Family:   . Attends Religious Services:   . Active Member of Clubs or Organizations:   . Attends Archivist Meetings:   Marland Kitchen Marital Status:      Family History: The patient's family history includes Colon cancer in his maternal grandmother; Colon polyps in his father; Diabetes in his maternal grandmother, mother, and sister; Diverticulitis in his father; Heart disease in his maternal grandfather, maternal grandmother, and paternal grandmother; Heart disease (age of onset: 42) in his father; Hyperlipidemia in his father, maternal grandfather, maternal grandmother, mother, and sister; Hypertension in his father, maternal grandfather, maternal grandmother, mother, and sister; Irritable bowel syndrome in his father and maternal grandfather; Liver cancer in his maternal grandfather; Lung cancer (age of onset: 43) in his father; Prostate cancer (age of onset: 43) in his father; Stroke in his maternal grandfather; Thyroid disease in his mother.  ROS:   Review of Systems  Constitutional: Negative.  HENT: Negative.   Eyes: Negative.   Cardiovascular: Positive for chest pain and dyspnea on exertion.  Respiratory: Positive for shortness of breath and snoring. Negative for cough, sputum production and wheezing.   Endocrine: Negative.   Hematologic/Lymphatic: Negative.   Skin: Negative.   Musculoskeletal: Negative.   Gastrointestinal: Positive for diarrhea.  Genitourinary: Negative.   Neurological: Negative.   Psychiatric/Behavioral: Negative.   Allergic/Immunologic: Negative.    Please see the history of present illness.     All other systems reviewed and are negative.  EKGs/Labs/Other Studies Reviewed:    The following studies were reviewed today:  Recent Labs:  08/30/2019: B Natriuretic Peptide 530.0 09/02/2019: Magnesium 2.1 06/27/2020: ALT 48; BUN 16; Creatinine,  Ser 1.10; Hemoglobin 14.2; Platelets 226; Potassium 3.8; Sodium 138  Recent Lipid Panel    Component Value Date/Time   CHOL 193 02/28/2019 0918   TRIG 127.0 02/28/2019 0918   HDL 39.50 02/28/2019 0918   CHOLHDL 5 02/28/2019 0918   VLDL 25.4 02/28/2019 0918   LDLCALC 128 (H) 02/28/2019 0918    Physical Exam:    VS:  BP 124/90 (BP Location: Right Arm, Patient Position: Sitting, Cuff Size: Normal)   Pulse 90   Ht 6\' 1"  (1.854 m)   Wt 285 lb (129.3 kg)   SpO2 94%   BMI 37.60 kg/m     Wt Readings from Last 3 Encounters:  07/12/20 285 lb (129.3 kg)  07/08/20 289 lb 3.2 oz (131.2 kg)  06/26/20 (!) 284 lb 6.3 oz (129 kg)     GEN: He does not appear chronically ill and has no xanthoma or xanthelasma well nourished, well developed in no acute distress HEENT: Normal NECK: No JVD; No carotid bruits LYMPHATICS: No lymphadenopathy CARDIAC: RRR, no murmurs, rubs, gallops RESPIRATORY:  Clear to auscultation without rales, wheezing or rhonchi  ABDOMEN: Soft, non-tender, non-distended MUSCULOSKELETAL:  No edema; No deformity  SKIN: Warm and dry NEUROLOGIC:  Alert and oriented x 3 PSYCHIATRIC:  Normal affect     Signed, Shirlee More, MD  07/12/2020 9:07 AM    [place]

## 2020-07-12 ENCOUNTER — Inpatient Hospital Stay: Payer: BC Managed Care – PPO | Attending: Family | Admitting: Family

## 2020-07-12 ENCOUNTER — Encounter: Payer: Self-pay | Admitting: Family

## 2020-07-12 ENCOUNTER — Inpatient Hospital Stay: Payer: BC Managed Care – PPO

## 2020-07-12 ENCOUNTER — Other Ambulatory Visit: Payer: Self-pay

## 2020-07-12 ENCOUNTER — Encounter: Payer: Self-pay | Admitting: Cardiology

## 2020-07-12 ENCOUNTER — Ambulatory Visit: Payer: BC Managed Care – PPO | Admitting: Cardiology

## 2020-07-12 VITALS — BP 120/85 | HR 99 | Temp 98.4°F | Resp 18 | Ht 73.0 in | Wt 283.0 lb

## 2020-07-12 VITALS — BP 124/90 | HR 90 | Ht 73.0 in | Wt 285.0 lb

## 2020-07-12 DIAGNOSIS — I82409 Acute embolism and thrombosis of unspecified deep veins of unspecified lower extremity: Secondary | ICD-10-CM | POA: Diagnosis not present

## 2020-07-12 DIAGNOSIS — Z7901 Long term (current) use of anticoagulants: Secondary | ICD-10-CM | POA: Diagnosis not present

## 2020-07-12 DIAGNOSIS — Z86711 Personal history of pulmonary embolism: Secondary | ICD-10-CM | POA: Insufficient documentation

## 2020-07-12 DIAGNOSIS — Z79899 Other long term (current) drug therapy: Secondary | ICD-10-CM | POA: Insufficient documentation

## 2020-07-12 DIAGNOSIS — R0789 Other chest pain: Secondary | ICD-10-CM | POA: Diagnosis not present

## 2020-07-12 DIAGNOSIS — E782 Mixed hyperlipidemia: Secondary | ICD-10-CM | POA: Diagnosis not present

## 2020-07-12 DIAGNOSIS — D6859 Other primary thrombophilia: Secondary | ICD-10-CM | POA: Diagnosis not present

## 2020-07-12 DIAGNOSIS — I2510 Atherosclerotic heart disease of native coronary artery without angina pectoris: Secondary | ICD-10-CM | POA: Diagnosis not present

## 2020-07-12 DIAGNOSIS — D6861 Antiphospholipid syndrome: Secondary | ICD-10-CM | POA: Insufficient documentation

## 2020-07-12 DIAGNOSIS — D801 Nonfamilial hypogammaglobulinemia: Secondary | ICD-10-CM

## 2020-07-12 DIAGNOSIS — I10 Essential (primary) hypertension: Secondary | ICD-10-CM | POA: Diagnosis not present

## 2020-07-12 DIAGNOSIS — I2699 Other pulmonary embolism without acute cor pulmonale: Secondary | ICD-10-CM

## 2020-07-12 DIAGNOSIS — R0602 Shortness of breath: Secondary | ICD-10-CM | POA: Diagnosis not present

## 2020-07-12 DIAGNOSIS — N182 Chronic kidney disease, stage 2 (mild): Secondary | ICD-10-CM

## 2020-07-12 LAB — CMP (CANCER CENTER ONLY)
ALT: 72 U/L — ABNORMAL HIGH (ref 0–44)
AST: 38 U/L (ref 15–41)
Albumin: 4.3 g/dL (ref 3.5–5.0)
Alkaline Phosphatase: 47 U/L (ref 38–126)
Anion gap: 12 (ref 5–15)
BUN: 22 mg/dL — ABNORMAL HIGH (ref 6–20)
CO2: 24 mmol/L (ref 22–32)
Calcium: 9.7 mg/dL (ref 8.9–10.3)
Chloride: 100 mmol/L (ref 98–111)
Creatinine: 1.45 mg/dL — ABNORMAL HIGH (ref 0.61–1.24)
GFR, Est AFR Am: >60 mL/min (ref >60)
GFR, Estimated: 57 mL/min — ABNORMAL LOW (ref >60)
Glucose, Bld: 93 mg/dL (ref 70–99)
Potassium: 3.9 mmol/L (ref 3.5–5.1)
Sodium: 136 mmol/L (ref 135–145)
Total Bilirubin: 0.7 mg/dL (ref 0.3–1.2)
Total Protein: 8.4 g/dL — ABNORMAL HIGH (ref 6.5–8.1)

## 2020-07-12 LAB — CBC WITH DIFFERENTIAL (CANCER CENTER ONLY)
Abs Immature Granulocytes: 0.06 10*3/uL (ref 0.00–0.07)
Basophils Absolute: 0.1 10*3/uL (ref 0.0–0.1)
Basophils Relative: 1 %
Eosinophils Absolute: 0.2 10*3/uL (ref 0.0–0.5)
Eosinophils Relative: 3 %
HCT: 44.6 % (ref 39.0–52.0)
Hemoglobin: 15.1 g/dL (ref 13.0–17.0)
Immature Granulocytes: 1 %
Lymphocytes Relative: 21 %
Lymphs Abs: 1.7 10*3/uL (ref 0.7–4.0)
MCH: 29.8 pg (ref 26.0–34.0)
MCHC: 33.9 g/dL (ref 30.0–36.0)
MCV: 88.1 fL (ref 80.0–100.0)
Monocytes Absolute: 0.6 10*3/uL (ref 0.1–1.0)
Monocytes Relative: 8 %
Neutro Abs: 5.4 10*3/uL (ref 1.7–7.7)
Neutrophils Relative %: 66 %
Platelet Count: 251 10*3/uL (ref 150–400)
RBC: 5.06 MIL/uL (ref 4.22–5.81)
RDW: 12.5 % (ref 11.5–15.5)
WBC Count: 8 10*3/uL (ref 4.0–10.5)
nRBC: 0 % (ref 0.0–0.2)

## 2020-07-12 LAB — D-DIMER, QUANTITATIVE: D-Dimer, Quant: 0.82 ug/mL-FEU — ABNORMAL HIGH (ref 0.00–0.50)

## 2020-07-12 MED ORDER — ATORVASTATIN CALCIUM 10 MG PO TABS: 10.0000 mg | ORAL_TABLET | Freq: Every day | ORAL | 1 refills | Status: DC

## 2020-07-12 NOTE — Patient Instructions (Signed)
Medication Instructions:  Your physician recommends that you continue on your current medications as directed. Please refer to the Current Medication list given to you today.  *If you need a refill on your cardiac medications before your next appointment, please call your pharmacy*   Lab Work: Your physician recommends that you return for lab work in: [place], Lipids, Lpa If you have labs (blood work) drawn today and your tests are completely normal, you will receive your results only by: Marland Kitchen MyChart Message (if you have MyChart) OR . A paper copy in the mail If you have any lab test that is abnormal or we need to change your treatment, we will call you to review the results.   Testing/Procedures: Your physician has requested that you have an echocardiogram. Echocardiography is a painless test that uses sound waves to create images of your heart. It provides your doctor with information about the size and shape of your heart and how well your heart's chambers and valves are working. This procedure takes approximately one hour. There are no restrictions for this procedure.    [facility] at [facility] [address], [place], [place] [address] Phone: [phone]    Please arrive 15 minutes prior to your appointment time for registration and insurance purposes.  The test will take approximately 3 to 4 hours to complete; you may bring reading material.  If someone comes with you to your appointment, they will need to remain in the main lobby due to limited space in the testing area. **If you are pregnant or breastfeeding, please notify the nuclear lab prior to your appointment**  How to prepare for your Myocardial Perfusion Test: . Do not eat or drink 3 hours prior to your test, except you may have water. . Do not consume products containing caffeine (regular or decaffeinated) 12 hours prior to your test. (ex: coffee, chocolate, sodas,  tea). . Do bring a list of your current medications with you.  If not listed below, you may take your medications as normal. . Do wear comfortable clothes (no dresses or overalls) and walking shoes, tennis shoes preferred (No heels or open toe shoes are allowed). . Do NOT wear cologne, perfume, aftershave, or lotions (deodorant is allowed). . If these instructions are not followed, your test will have to be rescheduled.  Please report to [address], [address] for your test.  If you have questions or concerns about your appointment, you can call the Nuclear Lab at [phone].  If you cannot keep your appointment, please provide 24 hours notification to the Nuclear Lab, to avoid a possible $50 charge to your account.    Follow-Up: At [facility], you and your health needs are our priority.  As part of our continuing mission to provide you with exceptional heart care, we have created designated Provider Care Teams.  These Care Teams include your primary Cardiologist (physician) and Advanced Practice Providers (APPs -  Physician Assistants and Nurse Practitioners) who all work together to provide you with the care you need, when you need it.  We recommend signing up for the patient portal called "MyChart".  Sign up information is provided on this After Visit Summary.  MyChart is used to connect with patients for Virtual Visits (Telemedicine).  Patients are able to view lab/test results, encounter notes, upcoming appointments, etc.  Non-urgent messages can be sent to your provider as well.   To learn more about what you can do with  MyChart, go to [email or web address].    Your next appointment:   6 week(s)  The format for your next appointment:   In Person  Provider:   Shirlee More, MD or Berniece Salines, MD   Other Instructions

## 2020-07-12 NOTE — Progress Notes (Signed)
Hematology and Oncology Follow Up Visit  Wesley Hancock [id number] [birth year] 49 y.o. 07/12/2020   Principle Diagnosis:  Antiphospholipid antibody syndrome Low protein S activity Bilateral pulmonary emboli with no heart strain and right lower extremity DVT  Current Therapy: Lovenox 120 mg SQ BID   Interim History:  Wesley Hancock is here today for follow-up. He was hospitalized last month with pneumonia and hypoxia.  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  He has GERD and has had midsternal chest pain at times.  He is scheduled to have an ECHO, stress test and sleep study within the next few weeks.  He is followed closely by cardiologist Dr. Rinaldo Cloud and Pulmonologist Dr, Weyman Croon.  There is concern with his care team that despite the infection in both lungs his WBC count was unaffected. He states that he has had this issue with past infections as well.   He is still feeling fatigued and SOB with over exertion. He will be starting a new inhaler BID.  He has started walking again daily for exercise.  He states that he is still doing well on Lovenox BID. No episode of bleeding. No bruising or petechiae.  No fever, chills, n/v, cough, rash, dizziness, palpitations, abdominal pain or changes in bowel or bladder habited.  He has had both Pfizer Covid vaccines.  No swelling, tenderness, numbness or tingling in his extremities at this time.  No falls or syncope.  He is eating well and hydrating. His weight is stable.   ECOG Performance Status: 1 - Symptomatic but completely ambulatory  Medications:  Allergies as of 07/12/2020      Reactions   Diltiazem Hcl Diarrhea, Other (See Comments)   Lethargic       Medication List       Accurate as of July 12, 2020  3:11 PM. If you have any questions, ask your nurse or doctor.        acetaminophen 500 MG tablet Commonly known as: TYLENOL Take  1,000 mg by mouth every 6 (six) hours as needed for mild pain.   atorvastatin 10 MG tablet Commonly known as: LIPITOR Take 1 tablet (10 mg total) by mouth daily.   enoxaparin 120 MG/0.8ML injection Commonly known as: LOVENOX Inject 0.8 mLs (120 mg total) into the skin every 12 (twelve) hours.   escitalopram 20 MG tablet Commonly known as: LEXAPRO Take 1 tablet (20 mg total) by mouth daily.   fluticasone 50 MCG/ACT nasal spray Commonly known as: FLONASE Place 1 spray into both nostrils daily.   nitroGLYCERIN 0.4 MG SL tablet Commonly known as: NITROSTAT Place 1 tablet (0.4 mg total) under the tongue every 5 (five) minutes as needed for chest pain.   pantoprazole 40 MG tablet Commonly known as: Protonix Take 1 tablet (40 mg total) by mouth 2 (two) times daily.   phenylephrine 10 MG Tabs tablet Commonly known as: SUDAFED PE Take 10 mg by mouth every 4 (four) hours as needed (congestion).   PROBIOTIC PO Take 1 capsule by mouth daily.   telmisartan-hydrochlorothiazide 80-25 MG tablet Commonly known as: MICARDIS HCT Take 1 tablet by mouth daily.       Allergies:  Allergies  Allergen Reactions  . Diltiazem Hcl Diarrhea and Other (See Comments)    Lethargic     Past Medical History, Surgical history, Social history, and Family History were reviewed and updated.  Review of Systems: All other 10 point review of  systems is negative.   Physical Exam:  height is 6\' 1"  (1.854 m) and weight is 283 lb (128.4 kg). His oral temperature is 98.4 F (36.9 C). His blood pressure is 120/85 and his pulse is 99. His respiration is 18 and oxygen saturation is 98%.   Wt Readings from Last 3 Encounters:  07/12/20 283 lb (128.4 kg)  07/12/20 285 lb (129.3 kg)  07/08/20 289 lb 3.2 oz (131.2 kg)    Ocular: Sclerae unicteric, pupils equal, round and reactive to light Ear-nose-throat: Oropharynx clear, dentition fair Lymphatic: No cervical or supraclavicular adenopathy Lungs no rales  or rhonchi, good excursion bilaterally Heart regular rate and rhythm, no murmur appreciated Abd soft, nontender, positive bowel sounds, no liver or spleen tip palpated on exam, no fluid wave  MSK no focal spinal tenderness, no joint edema Neuro: non-focal, well-oriented, appropriate affect Breasts: Deferred   Lab Results  Component Value Date   WBC 8.0 07/12/2020   HGB 15.1 07/12/2020   HCT 44.6 07/12/2020   MCV 88.1 07/12/2020   PLT 251 07/12/2020   Lab Results  Component Value Date   FERRITIN 135 12/20/2019   IRON 45 12/20/2019   TIBC 390 12/20/2019   UIBC 345 12/20/2019   IRONPCTSAT 12 (L) 12/20/2019   Lab Results  Component Value Date   RETICCTPCT 1.8 09/19/2019   RBC 5.06 07/12/2020   No results found for: KPAFRELGTCHN, LAMBDASER, KAPLAMBRATIO No results found for: IGGSERUM, IGA, IGMSERUM No results found for: Odetta Pink, SPEI   Chemistry      Component Value Date/Time   NA 138 06/27/2020 0521   K 3.8 06/27/2020 0521   CL 102 06/27/2020 0521   CO2 28 06/27/2020 0521   BUN 16 06/27/2020 0521   CREATININE 1.10 06/27/2020 0521   CREATININE 1.44 (H) 04/19/2020 0829      Component Value Date/Time   CALCIUM 9.5 06/27/2020 0521   ALKPHOS 44 06/27/2020 0521   AST 26 06/27/2020 0521   AST 35 04/19/2020 0829   ALT 48 (H) 06/27/2020 0521   ALT 68 (H) 04/19/2020 0829   BILITOT 0.7 06/27/2020 0521   BILITOT 0.6 04/19/2020 0829       Impression and Plan: Wesley Hancock is a very pleasant 49 yo caucasian gentleman with antiphospholipid antibody syndrome and mild protein S activity deficiency. He has history of bilateral pulmonary emboli with no heart strain and right lower extremity DVT.  He continues to do well on Lovenox and has not had any issue with new recurrence. He will continue his same regimen. D-dimer is down to 0.82. Protein S studies are pending.   He has had recent problems with recurrent pneumonia.  This felt to be due to aspiration pneumonia and he is scheduled for a battery of tests over the next few weeks as mentioned above.  He states that there is concern with his medical team that he has no increase in WBC count during infections.  I spoke with Dr. Marin Olp and we will add quantitative immunoglobulins to his lab work up to assess for hypogammaglobulinemia. He will come back in on Monday for this specific test.  He is in agreement with the plan and will contact our office with any questions or concerns.  He will keep his same follow-up appointment scheduled for in November. We can certainly see him sooner if needed.   [name], NP 8/13/20213:11 PM

## 2020-07-13 LAB — CARDIOLIPIN ANTIBODIES, IGG, IGM, IGA
Anticardiolipin IgA: 14 APL U/mL — ABNORMAL HIGH (ref 0–11)
Anticardiolipin IgG: 105 GPL U/mL — ABNORMAL HIGH (ref 0–14)
Anticardiolipin IgM: 27 MPL U/mL — ABNORMAL HIGH (ref 0–12)

## 2020-07-14 LAB — PRO B NATRIURETIC PEPTIDE: NT-Pro BNP: <5 pg/mL (ref 0–121)

## 2020-07-14 LAB — LIPOPROTEIN A (LPA): Lipoprotein (a): 9.1 nmol/L (ref <75.0)

## 2020-07-14 LAB — LIPID PANEL
Chol/HDL Ratio: 6.9 ratio — ABNORMAL HIGH (ref 0.0–5.0)
Cholesterol, Total: 283 mg/dL — ABNORMAL HIGH (ref 100–199)
HDL: 41 mg/dL (ref >39)
LDL Chol Calc (NIH): 196 mg/dL — ABNORMAL HIGH (ref 0–99)
Triglycerides: 234 mg/dL — ABNORMAL HIGH (ref 0–149)
VLDL Cholesterol Cal: 46 mg/dL — ABNORMAL HIGH (ref 5–40)

## 2020-07-14 LAB — PROTEIN S, TOTAL: Protein S Ag, Total: 114 % (ref 60–150)

## 2020-07-14 LAB — PROTEIN S ACTIVITY: Protein S Activity: 70 % (ref 63–140)

## 2020-07-15 ENCOUNTER — Other Ambulatory Visit: Payer: Self-pay

## 2020-07-15 ENCOUNTER — Telehealth: Payer: Self-pay

## 2020-07-15 ENCOUNTER — Inpatient Hospital Stay: Payer: BC Managed Care – PPO

## 2020-07-15 DIAGNOSIS — Z7901 Long term (current) use of anticoagulants: Secondary | ICD-10-CM | POA: Diagnosis not present

## 2020-07-15 DIAGNOSIS — D801 Nonfamilial hypogammaglobulinemia: Secondary | ICD-10-CM

## 2020-07-15 DIAGNOSIS — I82409 Acute embolism and thrombosis of unspecified deep veins of unspecified lower extremity: Secondary | ICD-10-CM

## 2020-07-15 DIAGNOSIS — I2699 Other pulmonary embolism without acute cor pulmonale: Secondary | ICD-10-CM

## 2020-07-15 DIAGNOSIS — D6861 Antiphospholipid syndrome: Secondary | ICD-10-CM

## 2020-07-15 DIAGNOSIS — Z86711 Personal history of pulmonary embolism: Secondary | ICD-10-CM | POA: Diagnosis not present

## 2020-07-15 DIAGNOSIS — Z79899 Other long term (current) drug therapy: Secondary | ICD-10-CM | POA: Diagnosis not present

## 2020-07-15 DIAGNOSIS — D6859 Other primary thrombophilia: Secondary | ICD-10-CM

## 2020-07-15 LAB — CMP (CANCER CENTER ONLY)
ALT: 56 U/L — ABNORMAL HIGH (ref 0–44)
AST: 27 U/L (ref 15–41)
Albumin: 4.2 g/dL (ref 3.5–5.0)
Alkaline Phosphatase: 42 U/L (ref 38–126)
Anion gap: 8 (ref 5–15)
BUN: 24 mg/dL — ABNORMAL HIGH (ref 6–20)
CO2: 28 mmol/L (ref 22–32)
Calcium: 9.9 mg/dL (ref 8.9–10.3)
Chloride: 102 mmol/L (ref 98–111)
Creatinine: 1.25 mg/dL — ABNORMAL HIGH (ref 0.61–1.24)
GFR, Est AFR Am: >60 mL/min (ref >60)
GFR, Estimated: >60 mL/min (ref >60)
Glucose, Bld: 112 mg/dL — ABNORMAL HIGH (ref 70–99)
Potassium: 4 mmol/L (ref 3.5–5.1)
Sodium: 138 mmol/L (ref 135–145)
Total Bilirubin: 0.5 mg/dL (ref 0.3–1.2)
Total Protein: 7.5 g/dL (ref 6.5–8.1)

## 2020-07-15 LAB — CBC WITH DIFFERENTIAL (CANCER CENTER ONLY)
Abs Immature Granulocytes: 0.05 10*3/uL (ref 0.00–0.07)
Basophils Absolute: 0.1 10*3/uL (ref 0.0–0.1)
Basophils Relative: 1 %
Eosinophils Absolute: 0.4 10*3/uL (ref 0.0–0.5)
Eosinophils Relative: 8 %
HCT: 41.6 % (ref 39.0–52.0)
Hemoglobin: 14.4 g/dL (ref 13.0–17.0)
Immature Granulocytes: 1 %
Lymphocytes Relative: 30 %
Lymphs Abs: 1.5 10*3/uL (ref 0.7–4.0)
MCH: 30.5 pg (ref 26.0–34.0)
MCHC: 34.6 g/dL (ref 30.0–36.0)
MCV: 88.1 fL (ref 80.0–100.0)
Monocytes Absolute: 0.5 10*3/uL (ref 0.1–1.0)
Monocytes Relative: 9 %
Neutro Abs: 2.7 10*3/uL (ref 1.7–7.7)
Neutrophils Relative %: 51 %
Platelet Count: 207 10*3/uL (ref 150–400)
RBC: 4.72 MIL/uL (ref 4.22–5.81)
RDW: 12.5 % (ref 11.5–15.5)
WBC Count: 5.1 10*3/uL (ref 4.0–10.5)
nRBC: 0 % (ref 0.0–0.2)

## 2020-07-15 LAB — LUPUS ANTICOAGULANT PANEL
DRVVT: 89.8 s — ABNORMAL HIGH (ref 0.0–47.0)
PTT Lupus Anticoagulant: 101.2 s — ABNORMAL HIGH (ref 0.0–51.9)

## 2020-07-15 LAB — DRVVT MIX: dRVVT Mix: 70.1 s — ABNORMAL HIGH (ref 0.0–40.4)

## 2020-07-15 LAB — HEXAGONAL PHASE PHOSPHOLIPID: Hexagonal Phase Phospholipid: 0 s (ref 0–11)

## 2020-07-15 LAB — D-DIMER, QUANTITATIVE: D-Dimer, Quant: 1.16 ug/mL-FEU — ABNORMAL HIGH (ref 0.00–0.50)

## 2020-07-15 LAB — DRVVT CONFIRM: dRVVT Confirm: 2.1 ratio — ABNORMAL HIGH (ref 0.8–1.2)

## 2020-07-15 LAB — PTT-LA MIX: PTT-LA Mix: 92.5 s — ABNORMAL HIGH (ref 0.0–48.9)

## 2020-07-15 MED ORDER — EZETIMIBE 10 MG PO TABS
10.0000 mg | ORAL_TABLET | Freq: Every day | ORAL | 3 refills | Status: DC
Start: 2020-07-15 — End: 2021-04-29

## 2020-07-15 MED ORDER — ROSUVASTATIN CALCIUM 20 MG PO TABS
20.0000 mg | ORAL_TABLET | Freq: Every day | ORAL | 3 refills | Status: DC
Start: 2020-07-15 — End: 2021-03-24

## 2020-07-15 NOTE — Telephone Encounter (Signed)
-----   Message from Richardo Priest, MD sent at 07/13/2020 12:07 PM EDT ----- Severe elevated LDL Stop atorvastatin New rosuvastatin 20 mg day and zetia 10 mg day and recheck 4 weeks lipids

## 2020-07-15 NOTE — Telephone Encounter (Signed)
Spoke with patient regarding results and recommendation.  Patient verbalizes understanding and is agreeable to plan of care. Advised patient to call back with any issues or concerns.  

## 2020-07-15 NOTE — Telephone Encounter (Signed)
Follow called to get results. The nurse took the call.

## 2020-07-15 NOTE — Telephone Encounter (Signed)
Left message on patients voicemail to please return our call.   

## 2020-07-16 ENCOUNTER — Encounter: Payer: Self-pay | Admitting: Physician Assistant

## 2020-07-16 ENCOUNTER — Telehealth: Payer: Self-pay

## 2020-07-16 LAB — IGG, IGA, IGM
IgA: 444 mg/dL — ABNORMAL HIGH (ref 90–386)
IgG (Immunoglobin G), Serum: 1219 mg/dL (ref 603–1613)
IgM (Immunoglobulin M), Srm: 172 mg/dL (ref 20–172)

## 2020-07-16 NOTE — Telephone Encounter (Signed)
Pt needs a note saying that he is able to return to work without restrictions.

## 2020-07-16 NOTE — Telephone Encounter (Signed)
Okay for note

## 2020-07-16 NOTE — Telephone Encounter (Signed)
Spoke to pt, told him I can do the letter. Pt verbalized understanding and said just needs to say can RTW without restrictions. Told pt okay will do now and put up front for pick up. Pt said he will pick it up tomorrow morning. Told him that is fine.

## 2020-07-16 NOTE — Telephone Encounter (Signed)
Okay for pt to return to work without restrictions?

## 2020-07-18 ENCOUNTER — Encounter: Payer: Self-pay | Admitting: Gastroenterology

## 2020-07-18 ENCOUNTER — Ambulatory Visit: Payer: BC Managed Care – PPO | Admitting: Gastroenterology

## 2020-07-18 ENCOUNTER — Other Ambulatory Visit: Payer: BC Managed Care – PPO

## 2020-07-18 VITALS — BP 122/88 | HR 99 | Ht 73.0 in | Wt 288.0 lb

## 2020-07-18 DIAGNOSIS — R945 Abnormal results of liver function studies: Secondary | ICD-10-CM

## 2020-07-18 DIAGNOSIS — K529 Noninfective gastroenteritis and colitis, unspecified: Secondary | ICD-10-CM

## 2020-07-18 DIAGNOSIS — K219 Gastro-esophageal reflux disease without esophagitis: Secondary | ICD-10-CM

## 2020-07-18 DIAGNOSIS — R7989 Other specified abnormal findings of blood chemistry: Secondary | ICD-10-CM

## 2020-07-18 DIAGNOSIS — R7401 Elevation of levels of liver transaminase levels: Secondary | ICD-10-CM | POA: Diagnosis not present

## 2020-07-18 NOTE — Progress Notes (Signed)
[facility] Progress Note  Chief Complaint: Chronic diarrhea  Subjective  History: I saw Linnie once in September 2018 with a long history of noncardiac chest pain.  The details are outlined in my extensive note.  Briefly, [place] "nutcracker esophagus".  Testing and treatment at [facility] prior to his visit with me showed a normal esophageal manometry.  pH study had been done but no results were available.  Patient had tried multiple medicines with either no relief or intolerance of side effects.  Aryn was seen by our NP on 05/21/2020 for a year of worsening diarrhea.  He reports several days of constipation followed by several days of nonbloody diarrhea, sometimes oily. Labs were done, colonoscopy planned with me.  Hematology later replied to the question of Lovenox management periprocedure, and said the patient can take his last dose of Lovenox a day prior to procedure.  Patient's noncardiac chest pain had apparently abated in recent years and he rarely needed NTG for it.  Since that visit, Deaunte was admitted to the hospital on July 28 for multifocal pneumonia with hypoxia.  CT angiogram ruled out pulmonary embolism.  Covid negative.  He required supplemental oxygen during the hospitalization but not afterwards.  Post hospital primary care note was reviewed, patient apparently improved from a respiratory standpoint but with concerns about what he had had pneumonia on 2 occasions.  Total IgG, IgM and IgA levels normal.  Mouhamadou was seen in pulmonary office consult on 07/09/2020 at [facility] (second opinion after prior Dr. Melvyn Novas evaluation, extensive note reviewed, suspected GERD), who suspected this was recurrent aspiration due to his reported history of esophageal motility problem.  Curiously, they also recorded history of achalasia, though we do not know that to be true from the records we had above.  Bronchoscopy was considered but apparently felt to  be greater risk than benefit for some reason.  He was seen in cardiology office consult on 07/12/2020, history of dyspnea which apparently seems disproportionate to small volume pulmonary embolism and status post treatment of pneumonia.  Patient had gained weight and was considered a risk for sleep apnea.  Apparently BN P was elevated in the hospital.  Nuclear stress test and echocardiogram have been scheduled. ________________________________  Christia Reading saw me for follow-up of his multiple complex issues.  Extensive chart review was required given everything that has happened in the length of time since I have last seen him.  His previous problem of noncardiac chest pain has all but disappeared in the few years since I last saw him.  He says he attributes it largely to changes in diet where he will only have rare episodes of things feeling stuck, and it is relieved by taking a sublingual nitroglycerin.  He has been on different PPIs for years because of suspected reflux, symptoms primarily having been heartburn or noncardiac chest pain.  He occasionally feels regurgitation.  He does not awaken at night with coughing choking or feelings of regurgitation.  He continues to have very troublesome diarrhea for at least the last year.  He says it began to a lesser degree prior to the September 2020 hospitalization for pneumonia and PE.  However, it has gotten steadily worse where he may have several loose nonbloody bowel movements in a day.  However he will then also have days of normal bowel movements or perhaps no BM.  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  He has taken occasional Imodium.  Probiotic for the last 2 months has perhaps helped a little.  He denies abdominal pain.  ROS: Cardiovascular:  no chest pain Respiratory: Chronic dyspnea Mood fairly stable on Lexapro.  He does have work and family and financial  stressors.  The patient's Past Medical, Family and Social History were reviewed and are on file in the EMR. Extensive history outlined in 05/21/2020 note by our NP.  Most notably, anxiety, depression, antiphospholipid antibody with history of PE with right heart strain and DVT September 2020, now on chronic Lovenox.  He had pneumonia in October 2020 Objective:  Med list reviewed  Current Outpatient Medications:  .  acetaminophen (TYLENOL) 500 MG tablet, Take 1,000 mg by mouth every 6 (six) hours as needed for mild pain., Disp: , Rfl:  .  enoxaparin (LOVENOX) 120 MG/0.8ML injection, Inject 0.8 mLs (120 mg total) into the skin every 12 (twelve) hours., Disp: 80 mL, Rfl: 1 .  escitalopram (LEXAPRO) 20 MG tablet, Take 1 tablet (20 mg total) by mouth daily., Disp: 90 tablet, Rfl: 2 .  ezetimibe (ZETIA) 10 MG tablet, Take 1 tablet (10 mg total) by mouth daily., Disp: 90 tablet, Rfl: 3 .  fluticasone (FLONASE) 50 MCG/ACT nasal spray, Place 1 spray into both nostrils daily. , Disp: , Rfl:  .  nitroGLYCERIN (NITROSTAT) 0.4 MG SL tablet, Place 1 tablet (0.4 mg total) under the tongue every 5 (five) minutes as needed for chest pain., Disp: 15 tablet, Rfl: 2 .  pantoprazole (PROTONIX) 40 MG tablet, Take 1 tablet (40 mg total) by mouth 2 (two) times daily., Disp: 180 tablet, Rfl: 1 .  phenylephrine (SUDAFED PE) 10 MG TABS tablet, Take 10 mg by mouth every 4 (four) hours as needed (congestion). , Disp: , Rfl:  .  Probiotic Product (PROBIOTIC PO), Take 1 capsule by mouth daily., Disp: , Rfl:  .  rosuvastatin (CRESTOR) 20 MG tablet, Take 1 tablet (20 mg total) by mouth daily., Disp: 90 tablet, Rfl: 3 .  telmisartan-hydrochlorothiazide (MICARDIS HCT) 80-25 MG tablet, Take 1 tablet by mouth daily., Disp: 90 tablet, Rfl: 2  He believes he has been on the Micardis a few years, previous antihypertensives were changed by Dr. Melvyn Novas.  Other medicines were started after the diarrhea began.  He believes he had been on  omeprazole prior to the Protonix change.  Vital signs in last 24 hrs: Vitals:   07/18/20 0818  BP: 122/88  Pulse: 99  SpO2: 96%    Physical Exam  He is not acutely ill-appearing.  He is pleasant conversational and breathing comfortably on room air.  HEENT: sclera anicteric, oral mucosa moist without lesions  Neck: supple, no thyromegaly, JVD or lymphadenopathy  Cardiac: RRR without murmurs, S1S2 heard, no peripheral edema  Pulm: clear to auscultation bilaterally, normal RR and effort noted  Abdomen: soft, no tenderness, with active bowel sounds. No guarding or palpable hepatosplenomegaly.  Skin; warm and dry, no jaundice or rash  Labs:  CMP Latest Ref Rng & Units 07/15/2020 07/12/2020 06/27/2020  Glucose 70 - 99 mg/dL 112(H) 93 113(H)  BUN 6 - 20 mg/dL 24(H) 22(H) 16  Creatinine 0.61 - 1.24 mg/dL 1.25(H) 1.45(H) 1.10  Sodium 135 - 145 mmol/L 138 136 138  Potassium 3.5 - 5.1 mmol/L 4.0 3.9 3.8  Chloride 98 - 111 mmol/L 102 100 102  CO2 22 - 32 mmol/L 28 24 28   Calcium 8.9 - 10.3 mg/dL  9.9 9.7 9.5  Total Protein 6.5 - 8.1 g/dL 7.5 8.4(H) 7.6  Total Bilirubin 0.3 - 1.2 mg/dL 0.5 0.7 0.7  Alkaline Phos 38 - 126 U/L 42 47 44  AST 15 - 41 U/L 27 38 26  ALT 0 - 44 U/L 56(H) 72(H) 48(H)   On 05/21/2020:  AST 39, ALT 84, alkaline phosphatase 50, total bilirubin 0.6 CRP negative IgA TTG antibody negative, total IgA level normal. ___________________________________________ Radiologic studies: CLINICAL DATA:  Short of breath upon awakening this morning. Coughing up some blood. Patient reports taking blood thinners.   EXAM: CT ANGIOGRAPHY CHEST WITH CONTRAST   TECHNIQUE: Multidetector CT imaging of the chest was performed using the standard protocol during bolus administration of intravenous contrast. Multiplanar CT image reconstructions and MIPs were obtained to evaluate the vascular anatomy.   CONTRAST:  60mL OMNIPAQUE IOHEXOL 350 MG/ML SOLN   COMPARISON:  05/30/2020    FINDINGS: Cardiovascular: Study somewhat limited due to respiratory motion. Allowing for this, there is no evidence of a pulmonary embolism.   Heart is normal in size. No pericardial effusion. Mild three-vessel coronary artery calcifications. Aorta is normal caliber. No dissection. No atherosclerosis.   Mediastinum/Nodes: Several prominent mediastinal lymph nodes. Are 2 pre-vascular nodes measuring 1 cm short axis. Right paratracheal node just above the azygos arch measures 1.3 cm in short axis. No neck base or axillary masses or enlarged lymph nodes. Mild bilateral hilar adenopathy, largest node on the right measuring 1.2 cm in short axis. Trachea and esophagus are unremarkable.   Lungs/Pleura: Bilateral patchy airspace lung opacities, which are greater on the right, have increased when compared to the prior chest CT. No pleural effusion or pneumothorax.   Upper Abdomen: No acute findings. Diffuse decreased attenuation of the liver consistent hepatic steatosis.   Musculoskeletal: No fracture or acute finding. No osteoblastic or osteolytic lesions.   Review of the MIP images confirms the above findings.   IMPRESSION: 1. No evidence of a pulmonary embolism. Study mildly degraded by respiratory motion. 2. Multifocal pneumonia. Lung infiltrates have increased when compared to the prior chest CT. There is associated mediastinal and hilar adenopathy, stable from the prior CT.     Electronically Signed   By: Lajean Manes M.D.   On: 06/26/2020 12:57     ____________________________________________ Other:  C diff negative Oct 2020   _____________________________________________ Assessment & Plan  Assessment: Encounter Diagnoses  Name Primary?  . Chronic diarrhea Yes  . Gastroesophageal reflux disease without esophagitis   . LFTs abnormal    Complex history of multiple GI issues being reevaluated today. The diarrhea is puzzling, especially where he has no abdominal  pain, bleeding and might also have normal bowel movements.  None of his medicines seem likely triggers, nor are they apparently new since the onset of symptoms.  Not consistent, which seems to speak against microscopic colitis or IBD or ARB related sprue-like condition.  Assuming he does not have ongoing infection, could have been a dysbiosis after multiple antibiotics for pneumonia last year.  No underlying known pancreatic problems.  Regarding the question of his recurrent pneumonia and suspicion of aspiration, his overall history is not convincing for that.  He had no known esophageal manometry disorder when last seen awake in 2016, results of the pH study at that time are unknown.  I do not know why his [facility] pulmonary consultant had a history of achalasia. His upper GI symptoms are minimal at present. I will do some further testing, but further  follow-up and testing by pulmonary will most likely be warranted.  Lastly, mild elevation in transaminases, ALT predominant.  Weight gain over the last couple years, seems likely fatty liver.  No recent abdominal imaging.  Plan: Viral hepatitis panel GI pathogen panel, C. difficile PCR, fecal elastase  Favor use of Pepto-Bismol rather than Imodium as has some anti-inflammatory effect and is less likely to precipitate constipation.  Continue probiotic for now, will likely stop it if no significant improvement in the next 6 weeks.  EGD with placement of wireless Bravo pH device and colonoscopy.  He was agreeable after discussion of procedure and risks.  The benefits and risks of the planned procedure were described in detail with the patient or (when appropriate) their health care proxy.  Risks were outlined as including, but not limited to, bleeding, infection, perforation, adverse medication reaction leading to cardiac or pulmonary decompensation, pancreatitis (if ERCP).  The limitation of incomplete mucosal visualization was also discussed.  No  guarantees or warranties were given.  Patient at increased risk for cardiopulmonary complications of procedure due to medical comorbidities.  Procedures will be done in the hospital outpatient endoscopy lab. Hematology had previously weighed in on the Lovenox management periprocedure, and said the last dose could be given the day prior to procedure.   60 minutes were spent on this encounter (including chart review, history/exam, counseling/coordination of care, and documentation)  Nelida Meuse III

## 2020-07-18 NOTE — Patient Instructions (Addendum)
If you are age 48 or older, your body mass index should be between 23-30. Your Body mass index is 38 kg/m. If this is out of the aforementioned range listed, please consider follow up with your Primary Care Provider.  If you are age 55 or younger, your body mass index should be between 19-25. Your Body mass index is 38 kg/m. If this is out of the aformentioned range listed, please consider follow up with your Primary Care Provider.   Your provider has requested that you go to the basement level for lab work before leaving today. Press "B" on the elevator. The lab is located at the first door on the left as you exit the elevator.  Due to recent changes in healthcare laws, you may see the results of your imaging and laboratory studies on MyChart before your provider has had a chance to review them.  We understand that in some cases there may be results that are confusing or concerning to you. Not all laboratory results come back in the same time frame and the provider may be waiting for multiple results in order to interpret others.  Please give [place] 48 hours in order for your provider to thoroughly review all the results before contacting the office for clarification of your results.   We will be in contact with you when we have an opening on the hospital schedule to set up your colon/EGD  It was a pleasure to see you today!  Dr. Loletha Carrow

## 2020-07-19 LAB — HEPATITIS C ANTIBODY
Hepatitis C Ab: NONREACTIVE
SIGNAL TO CUT-OFF: 0.02 (ref <1.00)

## 2020-07-19 LAB — HEPATITIS B SURFACE ANTIGEN: Hepatitis B Surface Ag: NONREACTIVE

## 2020-07-19 LAB — HEPATITIS B SURFACE ANTIBODY,QUALITATIVE: Hep B S Ab: NONREACTIVE

## 2020-07-24 ENCOUNTER — Encounter: Payer: Self-pay | Admitting: Physician Assistant

## 2020-07-24 ENCOUNTER — Encounter (HOSPITAL_COMMUNITY): Payer: Self-pay | Admitting: *Deleted

## 2020-07-25 NOTE — Telephone Encounter (Signed)
Wesley Hancock see message and advise.

## 2020-07-31 ENCOUNTER — Ambulatory Visit (HOSPITAL_COMMUNITY): Payer: BC Managed Care – PPO | Attending: Internal Medicine

## 2020-07-31 ENCOUNTER — Ambulatory Visit (HOSPITAL_BASED_OUTPATIENT_CLINIC_OR_DEPARTMENT_OTHER): Payer: BC Managed Care – PPO

## 2020-07-31 ENCOUNTER — Other Ambulatory Visit: Payer: Self-pay

## 2020-07-31 DIAGNOSIS — R0602 Shortness of breath: Secondary | ICD-10-CM

## 2020-07-31 DIAGNOSIS — R0789 Other chest pain: Secondary | ICD-10-CM | POA: Diagnosis not present

## 2020-07-31 LAB — ECHOCARDIOGRAM COMPLETE
Area-P 1/2: 4.83 cm2
Height: 73 in
S' Lateral: 3 cm
Weight: 4528 oz

## 2020-07-31 MED ORDER — TECHNETIUM TC 99M TETROFOSMIN IV KIT
32.2000 | PACK | Freq: Once | INTRAVENOUS | Status: AC | PRN
  Administered 2020-07-31: 32.2 via INTRAVENOUS
  Filled 2020-07-31: qty 33

## 2020-07-31 MED ORDER — REGADENOSON 0.4 MG/5ML IV SOLN
0.4000 mg | Freq: Once | INTRAVENOUS | Status: AC
  Administered 2020-07-31: 0.4 mg via INTRAVENOUS

## 2020-07-31 NOTE — Telephone Encounter (Signed)
Left message on voicemail to call office. FMLA paperwork is done.

## 2020-08-01 ENCOUNTER — Ambulatory Visit (HOSPITAL_COMMUNITY): Payer: BC Managed Care – PPO | Attending: Cardiovascular Disease

## 2020-08-01 ENCOUNTER — Other Ambulatory Visit: Payer: Self-pay | Admitting: Physician Assistant

## 2020-08-01 ENCOUNTER — Telehealth: Payer: Self-pay

## 2020-08-01 LAB — MYOCARDIAL PERFUSION IMAGING
LV dias vol: 87 mL (ref 62–150)
LV sys vol: 36 mL
Peak HR: 125 {beats}/min
Rest HR: 86 {beats}/min
SDS: 1
SRS: 0
SSS: 1
TID: 1.01

## 2020-08-01 MED ORDER — TECHNETIUM TC 99M TETROFOSMIN IV KIT
31.4000 | PACK | Freq: Once | INTRAVENOUS | Status: AC | PRN
  Administered 2020-08-01: 31.4 via INTRAVENOUS
  Filled 2020-08-01: qty 32

## 2020-08-01 NOTE — Telephone Encounter (Signed)
Spoke to pt told him FMLA paperwork is done. I will put at the front desk for you to pickup. Pt verbalized understanding and will pickup tomorrow.

## 2020-08-01 NOTE — Telephone Encounter (Signed)
Left message on patients voicemail to please return our call.   

## 2020-08-01 NOTE — Telephone Encounter (Signed)
-----   Message from Richardo Priest, MD sent at 08/01/2020  7:22 AM EDT ----- Good result, no findings of CHF

## 2020-08-02 ENCOUNTER — Telehealth: Payer: Self-pay

## 2020-08-02 NOTE — Telephone Encounter (Signed)
Spoke with patient regarding results and recommendation.  Patient verbalizes understanding and is agreeable to plan of care. Advised patient to call back with any issues or concerns.  

## 2020-08-02 NOTE — Telephone Encounter (Signed)
-----   Message from Richardo Priest, MD sent at 08/02/2020  8:02 AM EDT ----- Good results mentions her stress test was normal.

## 2020-08-23 DIAGNOSIS — M1990 Unspecified osteoarthritis, unspecified site: Secondary | ICD-10-CM | POA: Insufficient documentation

## 2020-08-23 DIAGNOSIS — K224 Dyskinesia of esophagus: Secondary | ICD-10-CM | POA: Insufficient documentation

## 2020-08-23 DIAGNOSIS — K635 Polyp of colon: Secondary | ICD-10-CM | POA: Insufficient documentation

## 2020-08-23 DIAGNOSIS — F419 Anxiety disorder, unspecified: Secondary | ICD-10-CM | POA: Insufficient documentation

## 2020-08-23 DIAGNOSIS — D6861 Antiphospholipid syndrome: Secondary | ICD-10-CM | POA: Insufficient documentation

## 2020-08-25 NOTE — Progress Notes (Deleted)
Cardiology Office Note:    Date:  08/25/2020   ID:  Wesley Hancock, DOB Sep 04, 1971, MRN [id number]  PCP:  Inda Coke, PA  Cardiologist:  Shirlee More, MD    Referring MD: Inda Coke, [place]    ASSESSMENT:    No diagnosis found. PLAN:    In order of problems listed above:  1. ***   Next appointment: ***   Medication Adjustments/Labs and Tests Ordered: Current medicines are reviewed at length with the patient today.  Concerns regarding medicines are outlined above.  No orders of the defined types were placed in this encounter.  No orders of the defined types were placed in this encounter.   No chief complaint on file.   History of Present Illness:    Wesley Hancock is a 49 y.o. male with a hx of history of pulmonary embolism with antiphospholipid syndrome pneumonia with hypoxemia and acute kidney injury lipidemia and hypertension last seen 07/12/2020 for shortness of breath.  He is maintained on long-term enoxaparin for his hypercoagulable disorder.  Review of records previous CTA showed the presence of coronary artery calcification and pulmonary function test are described as showing only mild restriction and no response to bronchodilator.  He has a strong family history of CAD father having myocardial infarction in his 82s bypass in his 58s and dying of heart failure in his 29s.  Is recently seen by pulmonary for second opinion feel that he is having aspiration pneumonia related to his esophageal disease. Compliance with diet, lifestyle and medications: ***  He desired further evaluation underwent myocardial perfusion imaging 07/31/2020.  The study was low risk normal left ventricular function EF 59% and normal perfusion rest and stress. Echocardiogram 07/31/2020 showed ejection fraction 60 to 65% mild concentric LVH and normal diastolic filling pressure.  Right ventricular function was normal.  Left atrium is mildly enlarged.  No significant valvular  abnormality. His lipid profile shows severe elevation of LDL cholesterol 196 and LP(a) level was normal.  proBNP level was very low consistent with noncardiac etiology of shortness of breath.  Past Medical History:  Diagnosis Date  . Acute nonintractable headache 09/06/2019  . Acute respiratory failure with hypoxia ([place]) 08/30/2019  . AKI (acute kidney injury) ([place]) 08/30/2019  . Antiphospholipid syndrome ([place]) 10/30/2019  . Anxiety   . APS (antiphospholipid syndrome) ([place])   . Arthritis   . Colon polyp    ? hyperplastic  . Depression   . Diarrhea 05/21/2020  . DVT (deep venous thrombosis) ([place])   . Dyspnea on exertion 09/08/2017   "Quit smoking" 2015 with onset of symptoms in 2016  Spirometry 09/08/2017  Flat f/v loop  - d/c acei 09/08/2017  - 01/04/2020   Walked RA x two laps =  approx 566ft @ fast pace - stopped due to end of study/ min sob with sats of 93 % at the end of the study. - PFT's  03/08/20   FEV1 3.84 (83 % ) ratio 0..90  p 3 % improvement from saba p ? prior to study with DLCO  26.46 (77%) corrects to 4.76 (99%)    . Esophageal spasm   . Essential hypertension 09/23/2014   D/c acei 09/08/2017 due to pseudocopd   . GAD (generalized anxiety disorder) 08/06/2016  . Lobar pneumonia ([place]) 08/30/2019  . Lung nodule 09/21/2014  . Mixed hyperlipidemia 09/18/2015  . Morbid obesity due to excess calories ([place]) c/b hbp/ dvt/PE 09/10/2019  . 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 Nutcracker esophagus   . Paresthesia of right foot 11/08/2019  . Pulmonary infiltrates 09/10/2019   In setting of bilateral PE 08/23/2019 with antiphospholipid syndrome   . Recurrent pneumonia 07/09/2020   Formatting of this note might be different from the original. 06/26/20, 03/08/20  . Snoring 07/09/2020  . Upper airway cough  syndrome 01/05/2020   Onset mid Jan 2021 while on otc PPI  - max rx for gerd 01/04/2020 >>>     . Witnessed apneic spells 07/09/2020    Past Surgical History:  Procedure Laterality Date  . CHOLECYSTECTOMY      Current Medications: No outpatient medications have been marked as taking for the 08/26/20 encounter (Appointment) with Richardo Priest, MD.     Allergies:   Diltiazem hcl   Social History   Socioeconomic History  . Marital status: Married    Spouse name: Not on file  . Number of children: 3  . Years of education: Not on file  . Highest education level: Not on file  Occupational History  . Occupation: Truck Geophysicist/field seismologist  Tobacco Use  . Smoking status: Former Smoker    Packs/day: 1.00    Years: 20.00    Pack years: 20.00    Types: Cigarettes    Quit date: 11/30/2013    Years since quitting: 6.7  . Smokeless tobacco: Former Systems developer    Types: Snuff    Quit date: 05/21/2018  Vaping Use  . Vaping Use: Never used  Substance and Sexual Activity  . Alcohol use: Yes    Alcohol/week: 5.0 standard drinks    Types: 5 Cans of beer per week    Comment: 1 per day  . Drug use: No  . Sexual activity: Yes  Other Topics Concern  . Not on file  Social History Narrative   Married (03/2016)   Lives in [place], [place] (Originally from [place] )   Some family in [place] live in [place].   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   Did play minor league baseball. ([place]   Likes to hunt, fish and plays in competitive soft ball league (slow pitch)   Interest includes trips to [employer] and collecting colorful socks.   Social Determinants of Health   Financial Resource Strain:   . Difficulty of Paying Living Expenses: Not on file  Food Insecurity:   . Worried About Charity fundraiser in the Last Year: Not on file  . Ran Out of Food in the Last Year: Not on file  Transportation Needs:   . Lack of Transportation (Medical): Not on file  . Lack  of Transportation (Non-Medical): Not on file  Physical Activity:   . Days of Exercise per Week: Not on file  . Minutes of Exercise per Session: Not on file  Stress:   . Feeling of Stress : Not on file  Social Connections:   . Frequency of Communication with Friends and Family: Not on file  . Frequency of Social Gatherings with Friends and Family: Not on file  . Attends Religious Services: Not on file  . Active Member of Clubs or Organizations: Not on file  . Attends Archivist Meetings: Not on file  . Marital Status: Not on file     Family  History: The patient's ***family history includes Colon cancer in his maternal grandmother; Colon polyps in his father; Diabetes in his maternal grandmother, mother, and sister; Diverticulitis in his father; Heart disease in his maternal grandfather, maternal grandmother, and paternal grandmother; Heart disease (age of onset: 22) in his father; Hyperlipidemia in his father, maternal grandfather, maternal grandmother, mother, and sister; Hypertension in his father, maternal grandfather, maternal grandmother, mother, and sister; Irritable bowel syndrome in his father and maternal grandfather; Liver cancer in his maternal grandfather; Lung cancer (age of onset: 65) in his father; Prostate cancer (age of onset: 20) in his father; Stroke in his maternal grandfather; Thyroid disease in his mother. ROS:   Please see the history of present illness.    All other systems reviewed and are negative.  EKGs/Labs/Other Studies Reviewed:    The following studies were reviewed today:  EKG:  EKG ordered today and personally reviewed.  The ekg ordered today demonstrates ***  Recent Labs: 08/30/2019: B Natriuretic Peptide 530.0 09/02/2019: Magnesium 2.1 07/12/2020: NT-Pro BNP <5 07/15/2020: ALT 56; BUN 24; Creatinine 1.25; Hemoglobin 14.4; Platelet Count 207; Potassium 4.0; Sodium 138  Recent Lipid Panel    Component Value Date/Time   CHOL 283 (H) 07/12/2020  0913   TRIG 234 (H) 07/12/2020 0913   HDL 41 07/12/2020 0913   CHOLHDL 6.9 (H) 07/12/2020 0913   CHOLHDL 5 02/28/2019 0918   VLDL 25.4 02/28/2019 0918   LDLCALC 196 (H) 07/12/2020 0913    Physical Exam:    VS:  There were no vitals taken for this visit.    Wt Readings from Last 3 Encounters:  07/31/20 283 lb (128.4 kg)  07/18/20 288 lb (130.6 kg)  07/12/20 283 lb (128.4 kg)     GEN: *** Well nourished, well developed in no acute distress HEENT: Normal NECK: No JVD; No carotid bruits LYMPHATICS: No lymphadenopathy CARDIAC: ***RRR, no murmurs, rubs, gallops RESPIRATORY:  Clear to auscultation without rales, wheezing or rhonchi  ABDOMEN: Soft, non-tender, non-distended MUSCULOSKELETAL:  No edema; No deformity  SKIN: Warm and dry NEUROLOGIC:  Alert and oriented x 3 PSYCHIATRIC:  Normal affect    Signed, Shirlee More, MD  08/25/2020 3:33 PM    [facility]

## 2020-08-26 ENCOUNTER — Ambulatory Visit: Payer: BC Managed Care – PPO | Admitting: Cardiology

## 2020-08-27 ENCOUNTER — Emergency Department (HOSPITAL_COMMUNITY)
Admission: EM | Admit: 2020-08-27 | Discharge: 2020-08-28 | Disposition: A | Discharge status: Home / Self Care | Payer: BC Managed Care – PPO | Attending: Emergency Medicine | Admitting: Emergency Medicine

## 2020-08-27 ENCOUNTER — Other Ambulatory Visit: Payer: Self-pay

## 2020-08-27 ENCOUNTER — Telehealth: Payer: Self-pay

## 2020-08-27 ENCOUNTER — Encounter (HOSPITAL_COMMUNITY): Payer: Self-pay

## 2020-08-27 DIAGNOSIS — D689 Coagulation defect, unspecified: Secondary | ICD-10-CM | POA: Diagnosis not present

## 2020-08-27 DIAGNOSIS — R519 Headache, unspecified: Secondary | ICD-10-CM | POA: Diagnosis not present

## 2020-08-27 DIAGNOSIS — R42 Dizziness and giddiness: Secondary | ICD-10-CM | POA: Diagnosis not present

## 2020-08-27 DIAGNOSIS — Z5321 Procedure and treatment not carried out due to patient leaving prior to being seen by health care provider: Secondary | ICD-10-CM | POA: Diagnosis not present

## 2020-08-27 LAB — CBC
HCT: 48.2 % (ref 39.0–52.0)
Hemoglobin: 16.5 g/dL (ref 13.0–17.0)
MCH: 31.1 pg (ref 26.0–34.0)
MCHC: 34.2 g/dL (ref 30.0–36.0)
MCV: 90.9 fL (ref 80.0–100.0)
Platelets: 197 10*3/uL (ref 150–400)
RBC: 5.3 MIL/uL (ref 4.22–5.81)
RDW: 13.1 % (ref 11.5–15.5)
WBC: 8.8 10*3/uL (ref 4.0–10.5)
nRBC: 0 % (ref 0.0–0.2)

## 2020-08-27 LAB — BASIC METABOLIC PANEL
Anion gap: 9 (ref 5–15)
BUN: 22 mg/dL — ABNORMAL HIGH (ref 6–20)
CO2: 27 mmol/L (ref 22–32)
Calcium: 9.8 mg/dL (ref 8.9–10.3)
Chloride: 100 mmol/L (ref 98–111)
Creatinine, Ser: 1.28 mg/dL — ABNORMAL HIGH (ref 0.61–1.24)
GFR calc Af Amer: >60 mL/min (ref >60)
GFR calc non Af Amer: >60 mL/min (ref >60)
Glucose, Bld: 105 mg/dL — ABNORMAL HIGH (ref 70–99)
Potassium: 4.8 mmol/L (ref 3.5–5.1)
Sodium: 136 mmol/L (ref 135–145)

## 2020-08-27 NOTE — Telephone Encounter (Signed)
Nurse Assessment Nurse: Raphael Gibney, RN, Vanita Ingles Date/Time Wesley Hancock Time): 08/27/2020 12:33:29 PM Confirm and document reason for call. If symptomatic, describe symptoms. ---Caller states he is having dizzy spells. Feels like he is spinning and he loses his balance. has a blood clotting disorder. Dizziness started yesterday. Pulse oximeter 93% and pulse 97. Does the patient have any new or worsening symptoms? ---Yes Will a triage be completed? ---Yes Related visit to physician within the last 2 weeks? ---No Does the PT have any chronic conditions? (i.e. diabetes, asthma, this includes High risk factors for pregnancy, etc.) ---Yes List chronic conditions. ---blood clotting disorder; HTN Is this a behavioral health or substance abuse call? ---No Guidelines Guideline Title Affirmed Question Affirmed Notes Nurse Date/Time (Eastern Time) Dizziness - Vertigo [1] Dizziness (vertigo) present now AND [2] one or more STROKE RISK FACTORS (i.e., hypertension, diabetes, prior stroke/TIA, heart attack) (Exception: prior physician evaluation for this AND no different/ worse than usual) Raphael Gibney, RN, Vanita Ingles 08/27/2020 12:36:16 PM PLEASE NOTE: All timestamps contained within this report are represented as [place] Standard Time. CONFIDENTIALTY NOTICE: This fax transmission is intended only for the addressee. It contains information that is legally privileged, confidential or otherwise protected from use or disclosure. If you are not the intended recipient, you are strictly prohibited from reviewing, disclosing, copying using or disseminating any of this information or taking any action in reliance on or regarding this information. If you have received this fax in error, please notify us immediately by telephone so that we can arrange for its return to [place]. Phone: [phone], Toll-Free: [phone], Fax: [phone] Page: 2 of 2 Call Id: [id number] [place]. Time Wesley Hancock Time) Disposition Final User 08/27/2020  12:40:31 PM Go to ED Now (or PCP triage) Yes Raphael Gibney, RN, Doreatha Lew Disagree/Comply Comply Caller Understands Yes PreDisposition Call Doctor Care Advice Given Per Guideline GO TO ED NOW (OR PCP TRIAGE): * IF NO PCP (PRIMARY CARE PROVIDER) SECOND-LEVEL TRIAGE: You need to be seen within the next hour. Go to the [facility] at _____________ [place] as soon as you can. NOTE TO TRIAGER - DRIVING: * Another adult should drive. Referrals GO TO FACILITY OTHER - SPECIF

## 2020-08-27 NOTE — Telephone Encounter (Signed)
Pt is  In the ED.

## 2020-08-27 NOTE — ED Triage Notes (Signed)
Patient reports dizziness with falls since yesterday. Patient denies LOC or hitting his head. Patient states a history of APS/bood clotting disorder. Patient also c/o headache x 3 hours.

## 2020-08-28 ENCOUNTER — Ambulatory Visit: Payer: BC Managed Care – PPO | Admitting: Nurse Practitioner

## 2020-08-28 ENCOUNTER — Encounter: Payer: Self-pay | Admitting: Nurse Practitioner

## 2020-08-28 VITALS — BP 130/88 | HR 100 | Temp 97.9°F | Ht 73.0 in | Wt 286.8 lb

## 2020-08-28 DIAGNOSIS — H8111 Benign paroxysmal vertigo, right ear: Secondary | ICD-10-CM | POA: Diagnosis not present

## 2020-08-28 MED ORDER — MECLIZINE HCL 12.5 MG PO TABS: 12.5000 mg | ORAL_TABLET | Freq: Three times a day (TID) | ORAL | 0 refills | Status: DC | PRN

## 2020-08-28 NOTE — Progress Notes (Signed)
Subjective:  Patient ID: Wesley Hancock, male    DOB: [birth year]  Age: 49 y.o. MRN: [id number]  CC: Acute Visit (Pt c/o dizziness x3 days, went to ER yesterday but sat there for over 7 hours without being seen. Pt gets dizzy whenever he goes to stand up and also when he turns his head to fast the dizziness comes. )  Dizziness This is a new problem. The current episode started in the past 7 days. The problem occurs intermittently. The problem has been unchanged. Associated symptoms include vertigo. Pertinent negatives include no chest pain, chills, congestion, coughing, diaphoresis, fatigue, fever, headaches, joint swelling, myalgias, nausea, numbness, rash, sore throat, swollen glands, urinary symptoms, visual change, vomiting or weakness. Associated symptoms comments: No palpitations, no hearing loss, no syncope. The symptoms are aggravated by twisting (head movement to left and beding forward). He has tried nothing for the symptoms.  did not take BP medication this AM Supine BP 152/94 Sitting BP 150/100 No ETOH or tobacco use No recent change in medications or new medication  Reviewed past Medical, Social and Family history today.  Outpatient Medications Prior to Visit  Medication Sig Dispense Refill   acetaminophen (TYLENOL) 500 MG tablet Take 1,000 mg by mouth every 6 (six) hours as needed for mild pain.     enoxaparin (LOVENOX) 120 MG/0.8ML injection Inject 0.8 mLs (120 mg total) into the skin every 12 (twelve) hours. 80 mL 1   escitalopram (LEXAPRO) 20 MG tablet TAKE 1 TABLET BY MOUTH EVERY DAY 90 tablet 1   ezetimibe (ZETIA) 10 MG tablet Take 1 tablet (10 mg total) by mouth daily. 90 tablet 3   fluticasone (FLONASE) 50 MCG/ACT nasal spray Place 1 spray into both nostrils daily.      nitroGLYCERIN (NITROSTAT) 0.4 MG SL tablet Place 1 tablet (0.4 mg total) under the tongue every 5 (five) minutes as needed for chest pain. 15 tablet 2   pantoprazole (PROTONIX) 40 MG tablet Take  1 tablet (40 mg total) by mouth 2 (two) times daily. 180 tablet 1   phenylephrine (SUDAFED PE) 10 MG TABS tablet Take 10 mg by mouth every 4 (four) hours as needed (congestion).      Probiotic Product (PROBIOTIC PO) Take 1 capsule by mouth daily.     rosuvastatin (CRESTOR) 20 MG tablet Take 1 tablet (20 mg total) by mouth daily. 90 tablet 3   telmisartan-hydrochlorothiazide (MICARDIS HCT) 80-25 MG tablet Take 1 tablet by mouth daily. 90 tablet 2   No facility-administered medications prior to visit.    ROS See HPI  Objective:  BP 130/88 (BP Location: Left Arm, Patient Position: Sitting, Cuff Size: Normal)    Pulse 100    Temp 97.9 F (36.6 C) (Temporal)    Ht 6\' 1"  (1.854 m)    Wt 286 lb 12.8 oz (130.1 kg)    SpO2 97%    BMI 37.84 kg/m   Physical Exam HENT:     Right Ear: Tympanic membrane, ear canal and external ear normal.     Left Ear: Tympanic membrane, ear canal and external ear normal.  Eyes:     Extraocular Movements: Extraocular movements intact.     Conjunctiva/sclera: Conjunctivae normal.     Pupils: Pupils are equal, round, and reactive to light.     Comments: Negative nystagmus  Cardiovascular:     Rate and Rhythm: Normal rate and regular rhythm.     Pulses: Normal pulses.     Heart sounds: Normal heart sounds.  Pulmonary:     Effort: Pulmonary effort is normal.     Breath sounds: Normal breath sounds.  Musculoskeletal:     Cervical back: Normal range of motion and neck supple.     Assessment & Plan:  This visit occurred during the SARS-CoV-2 public health emergency.  Safety protocols were in place, including screening questions prior to the visit, additional usage of staff PPE, and extensive cleaning of exam room while observing appropriate contact time as indicated for disinfecting solutions.   Wesley Hancock was seen today for acute visit.  Diagnoses and all orders for this visit:  BPPV (benign paroxysmal positional vertigo), right -     meclizine (ANTIVERT)  12.5 MG tablet; Take 1 tablet (12.5 mg total) by mouth 3 (three) times daily as needed for dizziness (do not use for more than 3days).  Call office if no improvement or worsening of symptoms.  Perform vertigo exercise once a day. Take BP medication as prescribed  Problem List Items Addressed This Visit    None    Visit Diagnoses    BPPV (benign paroxysmal positional vertigo), right    -  Primary   Relevant Medications   meclizine (ANTIVERT) 12.5 MG tablet      Follow-up: No follow-ups on file.  Wilfred Lacy, NP

## 2020-08-28 NOTE — Patient Instructions (Addendum)
Call office if no improvement or worsening of symptoms. Perform vertigo exercise once a day.  Vertigo Vertigo is the feeling that you or your surroundings are moving when they are not. This feeling can come and go at any time. Vertigo often goes away on its own. Vertigo can be dangerous if it occurs while you are doing something that could endanger you or others, such as driving or operating machinery. Your health care provider will do tests to determine the cause of your vertigo. Tests will also help your health care provider decide how best to treat your condition. Follow these instructions at home: Eating and drinking      Drink enough fluid to keep your urine pale yellow.  Do not drink alcohol. Activity  Return to your normal activities as told by your health care provider. Ask your health care provider what activities are safe for you.  In the morning, first sit up on the side of the bed. When you feel okay, stand slowly while you hold onto something until you know that your balance is fine.  Move slowly. Avoid sudden body or head movements or certain positions, as told by your health care provider.  If you have trouble walking or keeping your balance, try using a cane for stability. If you feel dizzy or unstable, sit down right away.  Avoid doing any tasks that would cause danger to you or others if vertigo occurs.  Avoid bending down if you feel dizzy. Place items in your home so that they are easy for you to reach without leaning over.  Do not drive or use heavy machinery if you feel dizzy. General instructions  Take over-the-counter and prescription medicines only as told by your health care provider.  Keep all follow-up visits as told by your health care provider. This is important. Contact a health care provider if:  Your medicines do not relieve your vertigo or they make it worse.  You have a fever.  Your condition gets worse or you develop new symptoms.  Your  family or friends notice any behavioral changes.  Your nausea or vomiting gets worse.  You have numbness or a prickling and tingling sensation in part of your body. Get help right away if you:  Have difficulty moving or speaking.  Are always dizzy.  Faint.  Develop severe headaches.  Have weakness in your hands, arms, or legs.  Have changes in your hearing or vision.  Develop a stiff neck.  Develop sensitivity to light. Summary  Vertigo is the feeling that you or your surroundings are moving when they are not.  Your health care provider will do tests to determine the cause of your vertigo.  Follow instructions for home care. You may be told to avoid certain tasks, positions, or movements.  Contact a health care provider if your medicines do not relieve your symptoms, or if you have a fever, nausea, vomiting, or changes in behavior.  Get help right away if you have severe headaches or difficulty speaking, or you develop hearing or vision problems. This information is not intended to replace advice given to you by your health care provider. Make sure you discuss any questions you have with your health care provider. Document Revised: 10/10/2018 Document Reviewed: 10/10/2018 Elsevier Patient Education  2020 [employer].

## 2020-09-05 ENCOUNTER — Other Ambulatory Visit: Payer: Self-pay | Admitting: Family

## 2020-09-05 DIAGNOSIS — I2699 Other pulmonary embolism without acute cor pulmonale: Secondary | ICD-10-CM

## 2020-09-05 DIAGNOSIS — I82409 Acute embolism and thrombosis of unspecified deep veins of unspecified lower extremity: Secondary | ICD-10-CM

## 2020-09-05 MED ORDER — ENOXAPARIN SODIUM 120 MG/0.8ML ~~LOC~~ SOLN: 120.0000 mg | Freq: Two times a day (BID) | SUBCUTANEOUS | 1 refills | Status: DC

## 2020-09-06 DIAGNOSIS — Z20822 Contact with and (suspected) exposure to covid-19: Secondary | ICD-10-CM | POA: Diagnosis not present

## 2020-09-09 ENCOUNTER — Encounter: Payer: Self-pay | Admitting: Physician Assistant

## 2020-09-09 ENCOUNTER — Telehealth: Payer: Self-pay

## 2020-09-09 NOTE — Telephone Encounter (Signed)
Patient states that he is needing ADA paperwork filled out today for his job, and is wanting to know if he can drop it off instead of scheduling an appointment.

## 2020-09-09 NOTE — Telephone Encounter (Signed)
Please tell pt he can drop off paperwork does not need an appt.

## 2020-09-10 ENCOUNTER — Telehealth: Payer: BC Managed Care – PPO | Admitting: Physician Assistant

## 2020-09-10 ENCOUNTER — Encounter: Payer: Self-pay | Admitting: Physician Assistant

## 2020-09-10 VITALS — Ht 73.0 in | Wt 287.0 lb

## 2020-09-10 DIAGNOSIS — Z0289 Encounter for other administrative examinations: Secondary | ICD-10-CM

## 2020-09-10 NOTE — Progress Notes (Signed)
Patient briefly discussed needs for ADA form completion over the telephone on today's visit.  No acute needs addressed.  No charge.

## 2020-09-20 ENCOUNTER — Ambulatory Visit: Payer: BC Managed Care – PPO | Admitting: Physician Assistant

## 2020-09-20 ENCOUNTER — Encounter: Payer: Self-pay | Admitting: Physician Assistant

## 2020-09-20 ENCOUNTER — Other Ambulatory Visit: Payer: Self-pay

## 2020-09-20 VITALS — BP 130/84 | HR 89 | Temp 98.4°F | Ht 73.0 in | Wt 287.5 lb

## 2020-09-20 DIAGNOSIS — F32A Depression, unspecified: Secondary | ICD-10-CM | POA: Diagnosis not present

## 2020-09-20 DIAGNOSIS — R29818 Other symptoms and signs involving the nervous system: Secondary | ICD-10-CM

## 2020-09-20 DIAGNOSIS — F419 Anxiety disorder, unspecified: Secondary | ICD-10-CM | POA: Diagnosis not present

## 2020-09-20 MED ORDER — BUPROPION HCL ER (XL) 150 MG PO TB24: ORAL_TABLET | ORAL | 1 refills | Status: DC

## 2020-09-20 NOTE — Progress Notes (Signed)
Wesley Hancock is a 49 y.o. male here for a follow up of a pre-existing problem.  I acted as a Education administrator for [employer], PA-C Anselmo Pickler, LPN  History of Present Illness:   Chief Complaint  Patient presents with  . Anxiety  . Depression   HPI   Anxiety / Depression Pt c/o increase with in his mood for the past few weeks. Having outbursts of anger, feeling like he has let everyone down due to his recent illnesses. Denies SI/HI. Currently taking Lexapro 20 mg daily.   Sleep study Has concerns for sleep apnea. Would like a sleep study for evaluation. 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  Depression screen [facility] 2/9 09/20/2020 02/21/2019 06/24/2017  Decreased Interest 2 0 0  Down, Depressed, Hopeless 2 0 0  PHQ - 2 Score 4 0 0  Altered sleeping 2 0 1  Tired, decreased energy 2 0 0  Change in appetite 3 0 0  Feeling bad or failure about yourself  3 0 0  Trouble concentrating 1 0 0  Moving slowly or fidgety/restless 1 0 0  Suicidal thoughts 0 0 0  PHQ-9 Score 16 0 1  Difficult doing work/chores Very difficult Not difficult at all -   GAD 7 : Generalized Anxiety Score 09/20/2020 02/21/2019 06/24/2017  Nervous, Anxious, on Edge 2 1 0  Control/stop worrying 2 0 0  Worry too much - different things 3 1 0  Trouble relaxing 3 0 1  Restless 0 0 0  Easily annoyed or irritable 2 0 1  Afraid - awful might happen 2 0 0  Total GAD 7 Score 14 2 2   Anxiety Difficulty Very difficult Not difficult at all -       Past Medical History:  Diagnosis Date  . Acute nonintractable headache 09/06/2019  . Acute respiratory failure with hypoxia ([place]) 08/30/2019  . AKI (acute kidney injury) ([place]) 08/30/2019  . Antiphospholipid syndrome ([place]) 10/30/2019  . Anxiety   . APS (antiphospholipid syndrome) ([place])   . Arthritis   . Colon polyp    ? hyperplastic  . Depression   . Diarrhea 05/21/2020  . DVT (deep venous thrombosis) ([place])   . Dyspnea on exertion  09/08/2017   "Quit smoking" 2015 with onset of symptoms in 2016  Spirometry 09/08/2017  Flat f/v loop  - d/c acei 09/08/2017  - 01/04/2020   Walked RA x two laps =  approx 5110ft @ fast pace - stopped due to end of study/ min sob with sats of 93 % at the end of the study. - PFT's  03/08/20   FEV1 3.84 (83 % ) ratio 0..90  p 3 % improvement from saba p ? prior to study with DLCO  26.46 (77%) corrects to 4.76 (99%)    . Esophageal spasm   . Essential hypertension 09/23/2014   D/c acei 09/08/2017 due to pseudocopd   . GAD (generalized anxiety disorder) 08/06/2016  . Lobar pneumonia ([place]) 08/30/2019  . Lung nodule 09/21/2014  . Mixed hyperlipidemia 09/18/2015  . Morbid obesity due to excess calories ([place]) c/b hbp/ dvt/PE 09/10/2019  . Multiple pulmonary emboli (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 Nutcracker esophagus   . Paresthesia of right foot 11/08/2019  . Pulmonary infiltrates 09/10/2019  In setting of bilateral PE 08/23/2019 with antiphospholipid syndrome   . Recurrent pneumonia 07/09/2020   Formatting of this note might be different from the original. 06/26/20, 03/08/20  . Snoring 07/09/2020  . Upper airway cough syndrome 01/05/2020   Onset mid Jan 2021 while on otc PPI  - max rx for gerd 01/04/2020 >>>     . Witnessed apneic spells 07/09/2020     Social History   Tobacco Use  . Smoking status: Former Smoker    Packs/day: 1.00    Years: 20.00    Pack years: 20.00    Types: Cigarettes    Quit date: 11/30/2013    Years since quitting: 6.8  . Smokeless tobacco: Former Systems developer    Types: Snuff    Quit date: 05/21/2018  Vaping Use  . Vaping Use: Never used  Substance Use Topics  . Alcohol use: Yes    Alcohol/week: 5.0 standard drinks    Types: 5 Cans of beer per week    Comment: 1 per day  . Drug use: No    Past Surgical History:   Procedure Laterality Date  . CHOLECYSTECTOMY      Family History  Problem Relation Age of Onset  . Diabetes Mother   . Hyperlipidemia Mother   . Hypertension Mother   . Thyroid disease Mother   . Heart disease Father 40  . Hyperlipidemia Father   . Hypertension Father   . Prostate cancer Father 41  . Lung cancer Father 90       Dx 06/18/2017  . Colon polyps Father   . Irritable bowel syndrome Father   . Diverticulitis Father   . Diabetes Sister   . Hyperlipidemia Sister   . Hypertension Sister   . Diabetes Maternal Grandmother   . Heart disease Maternal Grandmother   . Hyperlipidemia Maternal Grandmother   . Hypertension Maternal Grandmother   . Colon cancer Maternal Grandmother   . Heart disease Maternal Grandfather   . Hyperlipidemia Maternal Grandfather   . Hypertension Maternal Grandfather   . Stroke Maternal Grandfather   . Liver cancer Maternal Grandfather   . Irritable bowel syndrome Maternal Grandfather   . Heart disease Paternal Grandmother     Allergies  Allergen Reactions  . Diltiazem Hcl Diarrhea and Other (See Comments)    Lethargic     Current Medications:   Current Outpatient Medications:  .  acetaminophen (TYLENOL) 500 MG tablet, Take 1,000 mg by mouth every 6 (six) hours as needed for mild pain., Disp: , Rfl:  .  enoxaparin (LOVENOX) 120 MG/0.8ML injection, Inject 0.8 mLs (120 mg total) into the skin every 12 (twelve) hours., Disp: 80 mL, Rfl: 1 .  escitalopram (LEXAPRO) 20 MG tablet, TAKE 1 TABLET BY MOUTH EVERY DAY, Disp: 90 tablet, Rfl: 1 .  ezetimibe (ZETIA) 10 MG tablet, Take 1 tablet (10 mg total) by mouth daily., Disp: 90 tablet, Rfl: 3 .  fluticasone (FLONASE) 50 MCG/ACT nasal spray, Place 1 spray into both nostrils daily. , Disp: , Rfl:  .  nitroGLYCERIN (NITROSTAT) 0.4 MG SL tablet, Place 1 tablet (0.4 mg total) under the tongue every 5 (five) minutes as needed for chest pain., Disp: 15 tablet, Rfl: 2 .  pantoprazole (PROTONIX) 40 MG  tablet, Take 1 tablet (40 mg total) by mouth 2 (two) times daily., Disp: 180 tablet, Rfl: 1 .  phenylephrine (SUDAFED PE) 10 MG TABS tablet, Take 10 mg by mouth every 4 (four) hours as needed (congestion). , Disp: , Rfl:  .  Probiotic Product (PROBIOTIC PO), Take 1 capsule by mouth daily., Disp: , Rfl:  .  rosuvastatin (CRESTOR) 20 MG tablet, Take 1 tablet (20 mg total) by mouth daily., Disp: 90 tablet, Rfl: 3 .  telmisartan-hydrochlorothiazide (MICARDIS HCT) 80-25 MG tablet, Take 1 tablet by mouth daily., Disp: 90 tablet, Rfl: 2 .  buPROPion (WELLBUTRIN XL) 150 MG 24 hr tablet, Take 1 tablet daily, Disp: 30 tablet, Rfl: 1   Review of Systems:   ROS Negative unless otherwise specified per HPI.  Vitals:   Vitals:   09/20/20 0936  BP: 130/84  Pulse: 89  Temp: 98.4 F (36.9 C)  TempSrc: Temporal  SpO2: 96%  Weight: 287 lb 8 oz (130.4 kg)  Height: 6\' 1"  (1.854 m)     Body mass index is 37.93 kg/m.  Physical Exam:   Physical Exam Vitals and nursing note reviewed.  Constitutional:      Appearance: He is well-developed.  HENT:     Head: Normocephalic.  Eyes:     Conjunctiva/sclera: Conjunctivae normal.     Pupils: Pupils are equal, round, and reactive to light.  Pulmonary:     Effort: Pulmonary effort is normal.  Musculoskeletal:        General: Normal range of motion.     Cervical back: Normal range of motion.  Skin:    General: Skin is warm and dry.  Neurological:     Mental Status: He is alert and oriented to person, place, and time.  Psychiatric:        Behavior: Behavior normal.        Thought Content: Thought content normal.        Judgment: Judgment normal.      Assessment and Plan:   Khaleed was seen today for anxiety and depression.  Diagnoses and all orders for this visit:  Anxiety and depression Uncontrolled. Continue Lexapro 20 mg daily. Start Wellbutrin 150 mg extended released medication. Follow-up in 1-2 months, sooner if concerns. I discussed  with patient that if they develop any SI, to tell someone immediately and seek medical attention.  Suspected sleep apnea -     Ambulatory referral to Sleep Studies  Other orders -     buPROPion (WELLBUTRIN XL) 150 MG 24 hr tablet; Take 1 tablet daily  CMA or LPN served as scribe during this visit. History, Physical, and Plan performed by medical provider. The above documentation has been reviewed and is accurate and complete.  Inda Coke, PA-C

## 2020-09-20 NOTE — Addendum Note (Signed)
Addended by: Erlene Quan on: 09/20/2020 11:04 AM   Modules accepted: Orders

## 2020-09-20 NOTE — Patient Instructions (Signed)
It was great to see you!  Start Wellbutrin 150 mg daily. Continue Lexapro 20 mg daily.  Follow-up with me in 1-2 months to see how this is doing for you.  I have resubmitted your sleep study referral.  Take care,  Inda Coke PA-C

## 2020-10-10 DIAGNOSIS — G4733 Obstructive sleep apnea (adult) (pediatric): Secondary | ICD-10-CM | POA: Diagnosis not present

## 2020-10-11 DIAGNOSIS — Z9989 Dependence on other enabling machines and devices: Secondary | ICD-10-CM | POA: Diagnosis not present

## 2020-10-11 DIAGNOSIS — G4733 Obstructive sleep apnea (adult) (pediatric): Secondary | ICD-10-CM | POA: Insufficient documentation

## 2020-10-13 NOTE — Progress Notes (Signed)
Cardiology Office Note:    Date:  10/14/2020   ID:  Wesley Hancock, DOB [birth year], MRN [id number]  PCP:  Inda Coke, PA  Cardiologist:  Shirlee More, MD    Referring MD: Inda Coke, [place]    ASSESSMENT:    1. History of pulmonary embolus (PE)   2. Chronic anticoagulation   3. Hypercoagulable state ([place])   4. Hypertensive heart disease without heart failure   5. Coronary artery calcification seen on CAT scan   6. Pure hypercholesterolemia    PLAN:    In order of problems listed above:  1. Overall improved remains on long-term Lovenox for hypercoagulable state with venous thromboembolism. 2. BP improved continue current treatment should respond to CPAP when reinstituted 3. Stable no evidence of ischemia now on a statin 4. Check CMP and lipid profile   Next appointment: As needed   Medication Adjustments/Labs and Tests Ordered: Current medicines are reviewed at length with the patient today.  Concerns regarding medicines are outlined above.  Orders Placed This Encounter  Procedures  . Comprehensive metabolic panel  . Lipid panel   No orders of the defined types were placed in this encounter.   Chief Complaint  Patient presents with  . Follow-up    After testing with echocardiogram and myocardial perfusion test.  . Hypertension  . Hyperlipidemia  . Anticoagulation    With venous thromboembolism and hypercoagulable state    History of Present Illness:    Wesley Hancock is a 49 y.o. male with a hx of pulmonary embolism maintained on long-term enoxaparin with antiphospholipid syndrome and protein S deficiency.  He was last seen 07/12/2020 in consultation for shortness of breath.  He is followed by pulmonary and has had multifocal pneumonia and persistent infiltrates on x-ray.  CT scan also showed coronary artery calcification.  Other illnesses include nutcracker esophagus GERD esophageal spasm achalasia of the esophagus  Compliance with diet,  lifestyle and medications: Yes  He is much better depression is addressed mood affect better sleep apnea has been evaluated he is awaiting sleep CPAP oxygen sats were 95 to 97% at home and blood pressure is 130 135/85-100.  No chest pain shortness of breath edema.  Chart review: He had myocardial perfusion test 07/31/2020 showing EF normal 59% and normal perfusion. Echocardiogram 07/31/2020 showed EF of 60 to 65% mild concentric LVH and normal diastolic function. Follow-up labs at that visit showed a very low N-terminal proBNP level less than 5 and lipoprotein a level quite low at 9.1.  His total cholesterol was 283 with an LDL of 196 in the familial hyperlipidemia range. Is followed at [facility] for his severe obstructive sleep apnea being treated with CPAP. Past Medical History:  Diagnosis Date  . Acute nonintractable headache 09/06/2019  . Acute respiratory failure with hypoxia ([place]) 08/30/2019  . AKI (acute kidney injury) ([place]) 08/30/2019  . Antiphospholipid syndrome ([place]) 10/30/2019  . Anxiety   . APS (antiphospholipid syndrome) ([place])   . Arthritis   . Colon polyp    ? hyperplastic  . Depression   . Diarrhea 05/21/2020  . DVT (deep venous thrombosis) ([place])   . Dyspnea on exertion 09/08/2017   "Quit smoking" 2015 with onset of symptoms in 2016  Spirometry 09/08/2017  Flat f/v loop  - d/c acei 09/08/2017  - 01/04/2020   Walked RA x two laps =  approx 543ft @ fast pace - stopped due to end of study/ min sob with sats of 93 % at the  end of the study. - PFT's  03/08/20   FEV1 3.84 (83 % ) ratio 0..90  p 3 % improvement from saba p ? prior to study with DLCO  26.46 (77%) corrects to 4.76 (99%)    . Esophageal spasm   . Essential hypertension 09/23/2014   D/c acei 09/08/2017 due to pseudocopd   . GAD (generalized anxiety disorder) 08/06/2016  . Lobar pneumonia ([place]) 08/30/2019  . Lung nodule 09/21/2014  . Mixed hyperlipidemia 09/18/2015  . Morbid obesity due to excess calories  ([place]) c/b hbp/ dvt/PE 09/10/2019  . 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 Nutcracker esophagus   . Paresthesia of right foot 11/08/2019  . Pulmonary infiltrates 09/10/2019   In setting of bilateral PE 08/23/2019 with antiphospholipid syndrome   . Recurrent pneumonia 07/09/2020   Formatting of this note might be different from the original. 06/26/20, 03/08/20  . Snoring 07/09/2020  . Upper airway cough syndrome 01/05/2020   Onset mid Jan 2021 while on otc PPI  - max rx for gerd 01/04/2020 >>>     . Witnessed apneic spells 07/09/2020    Past Surgical History:  Procedure Laterality Date  . CHOLECYSTECTOMY      Current Medications: Current Meds  Medication Sig  . acetaminophen (TYLENOL) 500 MG tablet Take 1,000 mg by mouth every 6 (six) hours as needed for mild pain.  Marland Kitchen buPROPion (WELLBUTRIN XL) 150 MG 24 hr tablet Take 1 tablet daily  . enoxaparin (LOVENOX) 120 MG/0.8ML injection Inject 0.8 mLs (120 mg total) into the skin every 12 (twelve) hours.  Marland Kitchen escitalopram (LEXAPRO) 20 MG tablet TAKE 1 TABLET BY MOUTH EVERY DAY  . fluticasone (FLONASE) 50 MCG/ACT nasal spray Place 1 spray into both nostrils daily.   . nitroGLYCERIN (NITROSTAT) 0.4 MG SL tablet Place 1 tablet (0.4 mg total) under the tongue every 5 (five) minutes as needed for chest pain.  . pantoprazole (PROTONIX) 40 MG tablet Take 1 tablet (40 mg total) by mouth 2 (two) times daily.  . phenylephrine (SUDAFED PE) 10 MG TABS tablet Take 10 mg by mouth every 4 (four) hours as needed (congestion).   . Probiotic Product (PROBIOTIC PO) Take 1 capsule by mouth daily.  Marland Kitchen telmisartan-hydrochlorothiazide (MICARDIS HCT) 80-25 MG tablet Take 1 tablet by mouth daily.     Allergies:   Diltiazem hcl   Social History   Socioeconomic History  . Marital status:  Married    Spouse name: Not on file  . Number of children: 3  . Years of education: Not on file  . Highest education level: Not on file  Occupational History  . Occupation: Truck Geophysicist/field seismologist  Tobacco Use  . Smoking status: Former Smoker    Packs/day: 1.00    Years: 20.00    Pack years: 20.00    Types: Cigarettes    Quit date: 11/30/2013    Years since quitting: 6.8  . Smokeless tobacco: Former Systems developer    Types: Snuff    Quit date: 05/21/2018  Vaping Use  . Vaping Use: Never used  Substance and Sexual Activity  . Alcohol use: Yes    Alcohol/week: 5.0 standard drinks    Types: 5 Cans of beer per week    Comment: 1 per day  . Drug use: No  . Sexual activity:  Yes  Other Topics Concern  . Not on file  Social History Narrative   Married (03/2016)   Lives in [place], [place] (Originally from [place] )   Some family in [place] live in [place].   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   Did play minor league baseball. ([place]   Likes to hunt, fish and plays in competitive soft ball league (slow pitch)   Interest includes trips to [employer] [place] and collecting colorful socks.   Social Determinants of Health   Financial Resource Strain:   . Difficulty of Paying Living Expenses: Not on file  Food Insecurity:   . Worried About Charity fundraiser in the Last Year: Not on file  . Ran Out of Food in the Last Year: Not on file  Transportation Needs:   . Lack of Transportation (Medical): Not on file  . Lack of Transportation (Non-Medical): Not on file  Physical Activity:   . Days of Exercise per Week: Not on file  . Minutes of Exercise per Session: Not on file  Stress:   . Feeling of Stress : Not on file  Social Connections:   . Frequency of Communication with Friends and Family: Not on file  . Frequency of Social Gatherings with Friends and Family: Not on file  . Attends Religious Services: Not on file  . Active Member of Clubs or  Organizations: Not on file  . Attends Archivist Meetings: Not on file  . Marital Status: Not on file     Family History: The patient's family history includes Colon cancer in his maternal grandmother; Colon polyps in his father; Diabetes in his maternal grandmother, mother, and sister; Diverticulitis in his father; Heart disease in his maternal grandfather, maternal grandmother, and paternal grandmother; Heart disease (age of onset: 62) in his father; Hyperlipidemia in his father, maternal grandfather, maternal grandmother, mother, and sister; Hypertension in his father, maternal grandfather, maternal grandmother, mother, and sister; Irritable bowel syndrome in his father and maternal grandfather; Liver cancer in his maternal grandfather; Lung cancer (age of onset: 76) in his father; Prostate cancer (age of onset: 44) in his father; Stroke in his maternal grandfather; Thyroid disease in his mother. ROS:   Please see the history of present illness.    All other systems reviewed and are negative.  EKGs/Labs/Other Studies Reviewed:    The following studies were reviewed today:    Recent Labs: 07/12/2020: NT-Pro BNP <5 07/15/2020: ALT 56 08/27/2020: BUN 22; Creatinine, Ser 1.28; Hemoglobin 16.5; Platelets 197; Potassium 4.8; Sodium 136  Recent Lipid Panel    Component Value Date/Time   CHOL 283 (H) 07/12/2020 0913   TRIG 234 (H) 07/12/2020 0913   HDL 41 07/12/2020 0913   CHOLHDL 6.9 (H) 07/12/2020 0913   CHOLHDL 5 02/28/2019 0918   VLDL 25.4 02/28/2019 0918   LDLCALC 196 (H) 07/12/2020 0913    Physical Exam:    VS:  BP (!) 148/96   Pulse 86   Ht 6\' 1"  (1.854 m)   Wt 288 lb (130.6 kg)   SpO2 96%   BMI 38.00 kg/m     Wt Readings from Last 3 Encounters:  10/14/20 288 lb (130.6 kg)  09/20/20 287 lb 8 oz (130.4 kg)  09/10/20 287 lb (130.2 kg)    Repeat blood pressure by me large cuff 12/21/1986 GEN:  Well nourished, well developed in no acute distress HEENT:  Normal NECK: No  JVD; No carotid bruits LYMPHATICS: No lymphadenopathy CARDIAC: RRR, no murmurs, rubs, gallops RESPIRATORY:  Clear to auscultation without rales, wheezing or rhonchi  ABDOMEN: Soft, non-tender, non-distended MUSCULOSKELETAL:  No edema; No deformity  SKIN: Warm and dry NEUROLOGIC:  Alert and oriented x 3 PSYCHIATRIC:  Normal affect    Signed, Shirlee More, MD  10/14/2020 8:19 AM    [place]

## 2020-10-14 ENCOUNTER — Encounter: Payer: Self-pay | Admitting: Cardiology

## 2020-10-14 ENCOUNTER — Ambulatory Visit: Payer: BC Managed Care – PPO | Admitting: Cardiology

## 2020-10-14 ENCOUNTER — Other Ambulatory Visit: Payer: Self-pay

## 2020-10-14 VITALS — BP 148/96 | HR 86 | Ht 73.0 in | Wt 288.0 lb

## 2020-10-14 DIAGNOSIS — E7800 Pure hypercholesterolemia, unspecified: Secondary | ICD-10-CM

## 2020-10-14 DIAGNOSIS — Z7901 Long term (current) use of anticoagulants: Secondary | ICD-10-CM | POA: Diagnosis not present

## 2020-10-14 DIAGNOSIS — Z86711 Personal history of pulmonary embolism: Secondary | ICD-10-CM | POA: Diagnosis not present

## 2020-10-14 DIAGNOSIS — I119 Hypertensive heart disease without heart failure: Secondary | ICD-10-CM | POA: Diagnosis not present

## 2020-10-14 DIAGNOSIS — D6859 Other primary thrombophilia: Secondary | ICD-10-CM

## 2020-10-14 DIAGNOSIS — I2510 Atherosclerotic heart disease of native coronary artery without angina pectoris: Secondary | ICD-10-CM | POA: Diagnosis not present

## 2020-10-14 NOTE — Patient Instructions (Signed)
Medication Instructions:  Your physician recommends that you continue on your current medications as directed. Please refer to the Current Medication list given to you today.  *If you need a refill on your cardiac medications before your next appointment, please call your pharmacy*   Lab Work: Your physician recommends that you return for lab work in: [place], Lipids If you have labs (blood work) drawn today and your tests are completely normal, you will receive your results only by: Marland Kitchen MyChart Message (if you have MyChart) OR . A paper copy in the mail If you have any lab test that is abnormal or we need to change your treatment, we will call you to review the results.   Testing/Procedures: None   Follow-Up: At [facility], you and your health needs are our priority.  As part of our continuing mission to provide you with exceptional heart care, we have created designated Provider Care Teams.  These Care Teams include your primary Cardiologist (physician) and Advanced Practice Providers (APPs -  Physician Assistants and Nurse Practitioners) who all work together to provide you with the care you need, when you need it.  We recommend signing up for the patient portal called "MyChart".  Sign up information is provided on this After Visit Summary.  MyChart is used to connect with patients for Virtual Visits (Telemedicine).  Patients are able to view lab/test results, encounter notes, upcoming appointments, etc.  Non-urgent messages can be sent to your provider as well.   To learn more about what you can do with MyChart, go to [email or web address].    Your next appointment:   As needed  The format for your next appointment:   In Person  Provider:   Shirlee More, MD   Other Instructions

## 2020-10-15 ENCOUNTER — Telehealth: Payer: Self-pay

## 2020-10-15 LAB — COMPREHENSIVE METABOLIC PANEL
ALT: 92 IU/L — ABNORMAL HIGH (ref 0–44)
AST: 35 IU/L (ref 0–40)
Albumin/Globulin Ratio: 1.3 (ref 1.2–2.2)
Albumin: 4.4 g/dL (ref 4.0–5.0)
Alkaline Phosphatase: 58 IU/L (ref 44–121)
BUN/Creatinine Ratio: 16 (ref 9–20)
BUN: 19 mg/dL (ref 6–24)
Bilirubin Total: 0.8 mg/dL (ref 0.0–1.2)
CO2: 20 mmol/L (ref 20–29)
Calcium: 9.4 mg/dL (ref 8.7–10.2)
Chloride: 101 mmol/L (ref 96–106)
Creatinine, Ser: 1.18 mg/dL (ref 0.76–1.27)
GFR calc Af Amer: 84 mL/min/{1.73_m2} (ref >59)
GFR calc non Af Amer: 73 mL/min/{1.73_m2} (ref >59)
Globulin, Total: 3.4 g/dL (ref 1.5–4.5)
Glucose: 110 mg/dL — ABNORMAL HIGH (ref 65–99)
Potassium: 4 mmol/L (ref 3.5–5.2)
Sodium: 136 mmol/L (ref 134–144)
Total Protein: 7.8 g/dL (ref 6.0–8.5)

## 2020-10-15 LAB — LIPID PANEL
Chol/HDL Ratio: 3.7 ratio (ref 0.0–5.0)
Cholesterol, Total: 149 mg/dL (ref 100–199)
HDL: 40 mg/dL (ref >39)
LDL Chol Calc (NIH): 85 mg/dL (ref 0–99)
Triglycerides: 133 mg/dL (ref 0–149)
VLDL Cholesterol Cal: 24 mg/dL (ref 5–40)

## 2020-10-15 NOTE — Telephone Encounter (Signed)
Left message on patients voicemail to please return our call.   

## 2020-10-15 NOTE — Telephone Encounter (Signed)
-----   Message from Richardo Priest, MD sent at 10/15/2020 10:21 AM EST ----- Good result he has mild elevation of the liver function test and I would not change his treatment.

## 2020-10-18 ENCOUNTER — Other Ambulatory Visit: Payer: Self-pay | Admitting: Family

## 2020-10-18 DIAGNOSIS — D6861 Antiphospholipid syndrome: Secondary | ICD-10-CM

## 2020-10-18 DIAGNOSIS — I82409 Acute embolism and thrombosis of unspecified deep veins of unspecified lower extremity: Secondary | ICD-10-CM

## 2020-10-21 ENCOUNTER — Inpatient Hospital Stay: Payer: BC Managed Care – PPO | Attending: Family

## 2020-10-21 ENCOUNTER — Telehealth: Payer: Self-pay

## 2020-10-21 ENCOUNTER — Encounter: Payer: Self-pay | Admitting: Family

## 2020-10-21 ENCOUNTER — Other Ambulatory Visit: Payer: Self-pay

## 2020-10-21 ENCOUNTER — Inpatient Hospital Stay (HOSPITAL_BASED_OUTPATIENT_CLINIC_OR_DEPARTMENT_OTHER): Payer: BC Managed Care – PPO | Admitting: Family

## 2020-10-21 VITALS — BP 134/89 | HR 92 | Temp 98.7°F | Resp 17 | Ht 73.0 in | Wt 286.1 lb

## 2020-10-21 DIAGNOSIS — G4733 Obstructive sleep apnea (adult) (pediatric): Secondary | ICD-10-CM | POA: Insufficient documentation

## 2020-10-21 DIAGNOSIS — R5383 Other fatigue: Secondary | ICD-10-CM | POA: Insufficient documentation

## 2020-10-21 DIAGNOSIS — D6861 Antiphospholipid syndrome: Secondary | ICD-10-CM

## 2020-10-21 DIAGNOSIS — Z86711 Personal history of pulmonary embolism: Secondary | ICD-10-CM | POA: Diagnosis not present

## 2020-10-21 DIAGNOSIS — D6859 Other primary thrombophilia: Secondary | ICD-10-CM | POA: Diagnosis not present

## 2020-10-21 DIAGNOSIS — I2699 Other pulmonary embolism without acute cor pulmonale: Secondary | ICD-10-CM | POA: Diagnosis not present

## 2020-10-21 DIAGNOSIS — I82409 Acute embolism and thrombosis of unspecified deep veins of unspecified lower extremity: Secondary | ICD-10-CM

## 2020-10-21 LAB — CMP (CANCER CENTER ONLY)
ALT: 63 U/L — ABNORMAL HIGH (ref 0–44)
AST: 28 U/L (ref 15–41)
Albumin: 4.3 g/dL (ref 3.5–5.0)
Alkaline Phosphatase: 45 U/L (ref 38–126)
Anion gap: 9 (ref 5–15)
BUN: 22 mg/dL — ABNORMAL HIGH (ref 6–20)
CO2: 29 mmol/L (ref 22–32)
Calcium: 10.1 mg/dL (ref 8.9–10.3)
Chloride: 101 mmol/L (ref 98–111)
Creatinine: 1.33 mg/dL — ABNORMAL HIGH (ref 0.61–1.24)
GFR, Estimated: >60 mL/min (ref >60)
Glucose, Bld: 118 mg/dL — ABNORMAL HIGH (ref 70–99)
Potassium: 3.9 mmol/L (ref 3.5–5.1)
Sodium: 139 mmol/L (ref 135–145)
Total Bilirubin: 0.7 mg/dL (ref 0.3–1.2)
Total Protein: 7.9 g/dL (ref 6.5–8.1)

## 2020-10-21 LAB — CBC WITH DIFFERENTIAL (CANCER CENTER ONLY)
Abs Immature Granulocytes: 0.04 10*3/uL (ref 0.00–0.07)
Basophils Absolute: 0 10*3/uL (ref 0.0–0.1)
Basophils Relative: 1 %
Eosinophils Absolute: 0.3 10*3/uL (ref 0.0–0.5)
Eosinophils Relative: 6 %
HCT: 44.5 % (ref 39.0–52.0)
Hemoglobin: 15 g/dL (ref 13.0–17.0)
Immature Granulocytes: 1 %
Lymphocytes Relative: 27 %
Lymphs Abs: 1.6 10*3/uL (ref 0.7–4.0)
MCH: 30.7 pg (ref 26.0–34.0)
MCHC: 33.7 g/dL (ref 30.0–36.0)
MCV: 91 fL (ref 80.0–100.0)
Monocytes Absolute: 0.5 10*3/uL (ref 0.1–1.0)
Monocytes Relative: 9 %
Neutro Abs: 3.3 10*3/uL (ref 1.7–7.7)
Neutrophils Relative %: 56 %
Platelet Count: 209 10*3/uL (ref 150–400)
RBC: 4.89 MIL/uL (ref 4.22–5.81)
RDW: 12.4 % (ref 11.5–15.5)
WBC Count: 5.7 10*3/uL (ref 4.0–10.5)
nRBC: 0 % (ref 0.0–0.2)

## 2020-10-21 LAB — D-DIMER, QUANTITATIVE: D-Dimer, Quant: 0.95 ug/mL-FEU — ABNORMAL HIGH (ref 0.00–0.50)

## 2020-10-21 NOTE — Telephone Encounter (Signed)
Appts made per 10/21/20 los, pt req not to print and will view on mychart... AOM

## 2020-10-21 NOTE — Progress Notes (Signed)
Hematology and Oncology Follow Up Visit  Wesley Hancock [id number] 06-15-1971 49 y.o. 10/21/2020   Principle Diagnosis:  Antiphospholipid antibody syndrome Low protein S activity Bilateral pulmonary emboli with no heart strain and right lower extremity DVT  Current Therapy: Lovenox 120 mg SQ BID   Interim History:  Wesley Hancock is here today for follow-up. He is doing fairly well but still has some fatigue. He was recently diagnosed with obstructive sleep apnea and is currently waiting for his CPAP machine.  His stress test and ECHO in September were negative.  He states that since switching to Wellbutrin he has felt much better.  He continues to do well on Lovenox BID and has had no issues with bleeding. No abnormal bruising or petechiae.  No fever, chills, n/v, cough, rash, dizziness, palpitations, abdominal pain or changes in bowel or bladder habits.  He still has mild SOB at times.  No swelling, tenderness, numbness or tingling in his extremities.  No falls or syncope.  He has maintained a good appetite and is making an effort to stay well hydrated. His weight is stable.   ECOG Performance Status: 1 - Symptomatic but completely ambulatory  Medications:  Allergies as of 10/21/2020      Reactions   Diltiazem Hcl Diarrhea, Other (See Comments)   Lethargic       Medication List       Accurate as of October 21, 2020  8:46 AM. If you have any questions, ask your nurse or doctor.        acetaminophen 500 MG tablet Commonly known as: TYLENOL Take 1,000 mg by mouth every 6 (six) hours as needed for mild pain.   buPROPion 150 MG 24 hr tablet Commonly known as: WELLBUTRIN XL Take 1 tablet daily   enoxaparin 120 MG/0.8ML injection Commonly known as: LOVENOX Inject 0.8 mLs (120 mg total) into the skin every 12 (twelve) hours.   escitalopram 20 MG tablet Commonly known as: LEXAPRO TAKE 1 TABLET BY MOUTH EVERY DAY   ezetimibe 10 MG tablet Commonly  known as: ZETIA Take 1 tablet (10 mg total) by mouth daily.   fluticasone 50 MCG/ACT nasal spray Commonly known as: FLONASE Place 1 spray into both nostrils daily.   nitroGLYCERIN 0.4 MG SL tablet Commonly known as: NITROSTAT Place 1 tablet (0.4 mg total) under the tongue every 5 (five) minutes as needed for chest pain.   pantoprazole 40 MG tablet Commonly known as: Protonix Take 1 tablet (40 mg total) by mouth 2 (two) times daily.   phenylephrine 10 MG Tabs tablet Commonly known as: SUDAFED PE Take 10 mg by mouth every 4 (four) hours as needed (congestion).   PROBIOTIC PO Take 1 capsule by mouth daily.   rosuvastatin 20 MG tablet Commonly known as: CRESTOR Take 1 tablet (20 mg total) by mouth daily.   telmisartan-hydrochlorothiazide 80-25 MG tablet Commonly known as: MICARDIS HCT Take 1 tablet by mouth daily.       Allergies:  Allergies  Allergen Reactions  . Diltiazem Hcl Diarrhea and Other (See Comments)    Lethargic     Past Medical History, Surgical history, Social history, and Family History were reviewed and updated.  Review of Systems: All other 10 point review of systems is negative.   Physical Exam:  height is 6\' 1"  (1.854 m) and weight is 286 lb 1.9 oz (129.8 kg). His oral temperature is 98.7 F (37.1 C). His blood pressure is 134/89 and his pulse is 92. His respiration is 17  and oxygen saturation is 97%.   Wt Readings from Last 3 Encounters:  10/21/20 286 lb 1.9 oz (129.8 kg)  10/14/20 288 lb (130.6 kg)  09/20/20 287 lb 8 oz (130.4 kg)    Ocular: Sclerae unicteric, pupils equal, round and reactive to light Ear-nose-throat: Oropharynx clear, dentition fair Lymphatic: No cervical or supraclavicular adenopathy Lungs no rales or rhonchi, good excursion bilaterally Heart regular rate and rhythm, no murmur appreciated Abd soft, nontender, positive bowel sounds MSK no focal spinal tenderness, no joint edema Neuro: non-focal, well-oriented,  appropriate affect Breasts: Deferred   Lab Results  Component Value Date   WBC 5.7 10/21/2020   HGB 15.0 10/21/2020   HCT 44.5 10/21/2020   MCV 91.0 10/21/2020   PLT 209 10/21/2020   Lab Results  Component Value Date   FERRITIN 135 12/20/2019   IRON 45 12/20/2019   TIBC 390 12/20/2019   UIBC 345 12/20/2019   IRONPCTSAT 12 (L) 12/20/2019   Lab Results  Component Value Date   RETICCTPCT 1.8 09/19/2019   RBC 4.89 10/21/2020   No results found for: Nils Pyle, [facility] Lab Results  Component Value Date   IGGSERUM 1,219 07/15/2020   IGA 444 (H) 07/15/2020   IGMSERUM 172 07/15/2020   No results found for: Odetta Pink, SPEI   Chemistry      Component Value Date/Time   NA 136 10/14/2020 0823   K 4.0 10/14/2020 0823   CL 101 10/14/2020 0823   CO2 20 10/14/2020 0823   BUN 19 10/14/2020 0823   CREATININE 1.18 10/14/2020 0823   CREATININE 1.25 (H) 07/15/2020 0801      Component Value Date/Time   CALCIUM 9.4 10/14/2020 0823   ALKPHOS 58 10/14/2020 0823   AST 35 10/14/2020 0823   AST 27 07/15/2020 0801   ALT 92 (H) 10/14/2020 0823   ALT 56 (H) 07/15/2020 0801   BILITOT 0.8 10/14/2020 0823   BILITOT 0.5 07/15/2020 0801       Impression and Plan: Wesley Hancock is a very pleasant 49yo caucasian gentleman with antiphospholipid antibody syndrome and mild protein S activity deficiency. He has history of bilateral pulmonary emboli with no heart strain and right lower extremity DVT.  He continues to do well on Lovenox and so far there has been no new recurrence. He will continue his same regimen. D-dimer is down to 0.95.  Follow-up in 6 months.  He will contact our office with any questions or concerns. We can certainly see him sooner if needed.   [name], NP 11/22/20218:46 AM\

## 2020-10-23 LAB — CARDIOLIPIN ANTIBODIES, IGG, IGM, IGA
Anticardiolipin IgA: 14 APL U/mL — ABNORMAL HIGH (ref 0–11)
Anticardiolipin IgG: 67 GPL U/mL — ABNORMAL HIGH (ref 0–14)
Anticardiolipin IgM: 17 MPL U/mL — ABNORMAL HIGH (ref 0–12)

## 2020-10-23 LAB — LUPUS ANTICOAGULANT PANEL
DRVVT: 152.6 s — ABNORMAL HIGH (ref 0.0–47.0)
PTT Lupus Anticoagulant: 101.6 s — ABNORMAL HIGH (ref 0.0–51.9)

## 2020-10-23 LAB — PTT-LA MIX: PTT-LA Mix: 87.8 s — ABNORMAL HIGH (ref 0.0–48.9)

## 2020-10-23 LAB — DRVVT MIX: dRVVT Mix: 81.5 s — ABNORMAL HIGH (ref 0.0–40.4)

## 2020-10-23 LAB — DRVVT CONFIRM: dRVVT Confirm: 3.3 ratio — ABNORMAL HIGH (ref 0.8–1.2)

## 2020-10-23 LAB — HEXAGONAL PHASE PHOSPHOLIPID: Hexagonal Phase Phospholipid: 25 s — ABNORMAL HIGH (ref 0–11)

## 2020-11-04 ENCOUNTER — Telehealth: Payer: Self-pay

## 2020-11-04 NOTE — Telephone Encounter (Signed)
Patient is going to an Urgent Care to address issues.

## 2020-11-04 NOTE — Telephone Encounter (Signed)
Nurse Assessment Nurse: Wynetta Emery, RN, Wesley Hancock Date/Time Wesley Hancock Time): 11/04/2020 9:36:15 AM Confirm and document reason for call. If symptomatic, describe symptoms. ---Wesley Hancock had attempted to exit the tub and hand slipped (braced on tub wall) and fell onto the tub hurting right side rib cage onset yesterday am Does the patient have any new or worsening symptoms? ---Yes Will a triage be completed? ---Yes Related visit to physician within the last 2 weeks? ---No Does the PT have any chronic conditions? (i.e. diabetes, asthma, this includes High risk factors for pregnancy, etc.) ---No Is this a behavioral health or substance abuse call? ---No Guidelines Guideline Title Affirmed Question Affirmed Notes Nurse Date/Time Wesley Hancock Time) Chest Injury [1] Chest wall swelling, bruise or pain AND [2] present < 7 days Wesley Hancock 11/04/2020 9:37:52 AM Disp. Time Wesley Hancock Time) Disposition Final User 11/04/2020 9:40:24 AM See PCP within 24 Hours Yes Wynetta Emery, RN, Wesley Hancock Disposition Overriden: Home Care Override Reason: Patient's symptoms need a higher level of care Caller Disagree/Comply Comply Caller Understands Yes PreDisposition Call Doctor Care Advice Given Per Guideline PLEASE NOTE: All timestamps contained within this report are represented as [place] Standard Time. CONFIDENTIALTY NOTICE: This fax transmission is intended only for the addressee. It contains information that is legally privileged, confidential or otherwise protected from use or disclosure. If you are not the intended recipient, you are strictly prohibited from reviewing, disclosing, copying using or disseminating any of this information or taking any action in reliance on or regarding this information. If you have received this fax in error, please notify us immediately by telephone so that we can arrange for its return to [place]. Phone: [phone], Toll-Free: [phone], Fax: [phone] Page: 2 of 2 Call Id: [id number] Care  Advice Given Per Guideline SEE PCP WITHIN 24 HOURS: * IBUPROFEN (E.G., MOTRIN, ADVIL): Take 400 mg (two 200 mg pills) by mouth every 6 hours. The most you should take each day is 1,200 mg (six 200 mg pills), unless your doctor has told you to take more. * Put a cold pack or an ice bag (wrapped in a moist towel) on the area for 20 minutes. * Repeat in 1 hour, then every 4 hours while awake. * If pain, swelling, or bruising last more than 48 hours (2 days), then use heat on the area. * Use a heat pack, heating pad, or warm wet washcloth. * Do this for 10 minutes three times a day. * You become worse CARE ADVICE given per Chest Injury (Adult) guideline. Referrals REFERRED TO PCP OFFIC

## 2020-11-08 ENCOUNTER — Encounter: Payer: Self-pay | Admitting: Physician Assistant

## 2020-11-12 ENCOUNTER — Other Ambulatory Visit: Payer: Self-pay | Admitting: Physician Assistant

## 2020-11-13 ENCOUNTER — Telehealth: Payer: Self-pay | Admitting: Unknown Physician Specialty

## 2020-11-13 NOTE — Telephone Encounter (Signed)
Called to Discuss with patient about Covid symptoms and the use of the monoclonal antibody infusion for those with mild to moderate Covid symptoms and at a high risk of hospitalization.     Pt appears to qualify for this infusion due to co-morbid conditions and/or a member of an at-risk group in accordance with the FDA Emergency Use Authorization.    Unable to reach pt  [facility]

## 2020-11-25 ENCOUNTER — Ambulatory Visit: Payer: BC Managed Care – PPO | Admitting: Family Medicine

## 2020-11-25 ENCOUNTER — Encounter: Payer: Self-pay | Admitting: Family Medicine

## 2020-11-25 ENCOUNTER — Other Ambulatory Visit: Payer: Self-pay

## 2020-11-25 VITALS — BP 132/80 | HR 99 | Temp 98.3°F | Resp 18 | Ht 73.0 in | Wt 287.6 lb

## 2020-11-25 DIAGNOSIS — N182 Chronic kidney disease, stage 2 (mild): Secondary | ICD-10-CM

## 2020-11-25 DIAGNOSIS — R079 Chest pain, unspecified: Secondary | ICD-10-CM

## 2020-11-25 MED ORDER — BUPROPION HCL ER (XL) 150 MG PO TB24
ORAL_TABLET | ORAL | 3 refills | Status: DC
Start: 2020-11-25 — End: 2021-04-25

## 2020-11-25 MED ORDER — AZITHROMYCIN 250 MG PO TABS: ORAL_TABLET | ORAL | 0 refills | Status: DC

## 2020-11-25 NOTE — Patient Instructions (Signed)
It was very nice to see you today!  I will refill your Wellbutrin.  We will check a CT scan to make sure that you do not have a recurrent blood clot or pneumonia.  Please start the antibiotic.  Let us know if not improving.  Take care, Dr [name]  Please try these tips to maintain a healthy lifestyle:   Eat at least 3 REAL meals and 1-2 snacks per day.  Aim for no more than 5 hours between eating.  If you eat breakfast, please do so within one hour of getting up.    Each meal should contain half fruits/vegetables, one quarter protein, and one quarter carbs (no bigger than a computer mouse)   Cut down on sweet beverages. This includes juice, soda, and sweet tea.     Drink at least 1 glass of water with each meal and aim for at least 8 glasses per day   Exercise at least 150 minutes every week.

## 2020-11-25 NOTE — Progress Notes (Signed)
   Wesley Hancock is a 49 y.o. male who presents today for an office visit.  Assessment/Plan:  Dyspnea Possibly sequela of recent Covid 19 infection however patient has complicated medical history of recurrent pneumonia and PE.  He is tachycardic today.  Low likelihood that he has PE given his anticoagulation on Lovenox however due to recent Covid illness he is at increased risk.  We will check CTA to further evaluate for this and also look for any signs of pneumonia.  Will empirically start azithromycin until results of CTA come back.  Patient does not want to go to the hospital.  Discussed reasons to return to care and seek emergent care.  Chronic Problems Addressed Today: CKD Stage 2 Advised patient to stay well-hydrated prior to CT scan.  Most recent creatinine 1.3 last month.  DVT/PE Anticoagulated on Lovenox as above.    Subjective:  HPI:  Patient here with several weeks of progressive shortness of breath and low oxygen levels.  He has a history of multiple PEs and multiple recurrent pneumonia.  This is secondary to antiphospholipid syndrome.  Currently follows with pulmonology and is on Lovenox twice daily.  He was diagnosed with Covid 4 weeks ago.  States that his symptoms were overall moderate.  Did not have to have hospitalization.  Did not receive antibody infusion.  Current symptoms feel like his recurrent pneumonia.  Has more dyspnea on exertion.  No chest pain but does have some back pain as well.  Sometimes has wheezing.       Objective:  Physical Exam: BP 132/80   Pulse 99   Temp 98.3 F (36.8 C) (Temporal)   Resp 18   Ht 6\' 1"  (1.854 m)   Wt 287 lb 9.6 oz (130.5 kg)   SpO2 90%   BMI 37.94 kg/m   Gen: No acute distress, resting comfortably CV: Tachycardic regular rhythm with no murmurs appreciated Pulm: Normal work of breathing, clear to auscultation bilaterally with no crackles, wheezes, or rhonchi Neuro: Grossly normal, moves all extremities Psych: Normal  affect and thought content      Candace Ramus M. , MD 11/25/2020 1:07 PM

## 2020-11-26 ENCOUNTER — Ambulatory Visit (HOSPITAL_COMMUNITY)
Admission: RE | Admit: 2020-11-26 | Discharge: 2020-11-26 | Disposition: A | Discharge status: Home / Self Care | Payer: BC Managed Care – PPO | Source: Ambulatory Visit | Attending: Family Medicine | Admitting: Family Medicine

## 2020-11-26 ENCOUNTER — Telehealth: Payer: Self-pay

## 2020-11-26 DIAGNOSIS — R079 Chest pain, unspecified: Secondary | ICD-10-CM

## 2020-11-26 DIAGNOSIS — E041 Nontoxic single thyroid nodule: Secondary | ICD-10-CM | POA: Diagnosis not present

## 2020-11-26 DIAGNOSIS — R0602 Shortness of breath: Secondary | ICD-10-CM | POA: Diagnosis not present

## 2020-11-26 LAB — POCT I-STAT CREATININE: Creatinine, Ser: 1.3 mg/dL — ABNORMAL HIGH (ref 0.61–1.24)

## 2020-11-26 MED ORDER — IOHEXOL 350 MG/ML SOLN
100.0000 mL | Freq: Once | INTRAVENOUS | Status: AC | PRN
  Administered 2020-11-26: 75 mL via INTRAVENOUS

## 2020-11-26 NOTE — Telephone Encounter (Signed)
Pt is scheduled for 11/26/20 @ 3 pm I called to make him aware but had to leave a VM-pt will need to be NPO for 4 hours prior and arrive 15 minutes early I also requested he contact Centralized scheduling at [phone] if he needs to re-schedule

## 2020-11-26 NOTE — Progress Notes (Signed)
Please inform patient of the following:  CT scan showed possible pneumonia. Recommend he complete his course of azithromycin. He has a thyroid nodule and they recommend ultrasound. Please place order for this.  No signs of blood clot.  He has a healing rib fracutre.  Would like for him to let us know if symptoms are not improving.

## 2020-11-27 ENCOUNTER — Other Ambulatory Visit: Payer: Self-pay | Admitting: *Deleted

## 2020-11-27 DIAGNOSIS — E041 Nontoxic single thyroid nodule: Secondary | ICD-10-CM

## 2020-12-29 ENCOUNTER — Other Ambulatory Visit: Payer: Self-pay | Admitting: Physician Assistant

## 2020-12-30 ENCOUNTER — Encounter: Payer: Self-pay | Admitting: Family Medicine

## 2021-01-01 NOTE — Telephone Encounter (Signed)
Call in error

## 2021-01-07 ENCOUNTER — Other Ambulatory Visit: Payer: Self-pay

## 2021-01-07 ENCOUNTER — Other Ambulatory Visit: Payer: Self-pay | Admitting: *Deleted

## 2021-01-07 ENCOUNTER — Ambulatory Visit (HOSPITAL_COMMUNITY)
Admission: RE | Admit: 2021-01-07 | Discharge: 2021-01-07 | Disposition: A | Discharge status: Home / Self Care | Payer: BC Managed Care – PPO | Source: Ambulatory Visit | Attending: Family Medicine | Admitting: Family Medicine

## 2021-01-07 DIAGNOSIS — E041 Nontoxic single thyroid nodule: Secondary | ICD-10-CM | POA: Diagnosis not present

## 2021-01-07 DIAGNOSIS — E042 Nontoxic multinodular goiter: Secondary | ICD-10-CM | POA: Diagnosis not present

## 2021-01-07 NOTE — Progress Notes (Signed)
Please inform patient of the following:  His ultrasound showed several thyroid nodules. The radiologist recommended one of the nodules be sampled. I recommend that we refer him to an endocrinologist for further evaluation. Please place referral.  Wesley Hancock M. Jerline Pain, MD 01/07/2021 8:37 AM

## 2021-01-09 ENCOUNTER — Encounter: Payer: Self-pay | Admitting: Family Medicine

## 2021-01-09 ENCOUNTER — Ambulatory Visit: Payer: BC Managed Care – PPO | Admitting: Family Medicine

## 2021-01-09 ENCOUNTER — Other Ambulatory Visit: Payer: Self-pay

## 2021-01-09 VITALS — BP 111/72 | HR 92 | Temp 98.2°F | Ht 73.0 in | Wt 284.6 lb

## 2021-01-09 DIAGNOSIS — K224 Dyskinesia of esophagus: Secondary | ICD-10-CM

## 2021-01-09 DIAGNOSIS — R079 Chest pain, unspecified: Secondary | ICD-10-CM | POA: Diagnosis not present

## 2021-01-09 DIAGNOSIS — D6861 Antiphospholipid syndrome: Secondary | ICD-10-CM | POA: Diagnosis not present

## 2021-01-09 NOTE — Patient Instructions (Signed)
It was very nice to see you today!  Your EKG today is normal.  I do not think your symptoms are coming from your heart.  It is possible you could be having some inflammation in your chest wall.  Please let me know how things are going over the next couple of days and let me know if you would like to start prednisone.  We should avoid anti-inflammatories while you are on a blood thinner.  Take care, Dr Jerline Pain  Please try these tips to maintain a healthy lifestyle:   Eat at least 3 REAL meals and 1-2 snacks per day.  Aim for no more than 5 hours between eating.  If you eat breakfast, please do so within one hour of getting up.    Each meal should contain half fruits/vegetables, one quarter protein, and one quarter carbs (no bigger than a computer mouse)   Cut down on sweet beverages. This includes juice, soda, and sweet tea.     Drink at least 1 glass of water with each meal and aim for at least 8 glasses per day   Exercise at least 150 minutes every week.

## 2021-01-09 NOTE — Progress Notes (Signed)
   Wesley Hancock is a 50 y.o. male who presents today for an office visit.  Assessment/Plan:  New/Acute Problems: Chest Pain PE given that he is anticoagulated on Lovenox and recently had CTA which was normal.  Symptoms are not consistent with cardiac etiology and EKG today is with normal sinus rhythm and no ischemic changes.  He does have a history of esophageal spasm which could be contributing.  Symptoms also possibly musculoskeletal.  He wishes to continue with watchful waiting at this point.  Would avoid NSAIDs given that he is on anticoagulation.  If symptoms not improve we consider trial of low-dose prednisone burst.  He will follow-up with GI soon due to his history of GERD and esophageal spasm.  Chronic Problems Addressed Today: Esophageal Spasm He is on Protonix 40 mg twice daily.  Will follow up with GI soon.  History of PE and antiphospholipid syndrome Has been compliant with Lovenox injections twice daily.  History not consistent with PE.    Subjective:  HPI:  Patient here with substernal chest pain for the last day.  Symptoms occurred suddenly yesterday.  Tried taking Tylenol which helped.  Started yesterday while he is in the shower.  Worse with taking a deep breath.  Worse when lying down.  He has not been able to maintain his normal activity and is been able to exert himself as he has previously without any worsening symptoms.  No shortness of breath.  No nausea or vomiting.  Has a history of esophageal spasms.  He also has history of recurrent pneumonia and pulmonary embolism.  States that his current pain does not feel like prior pulmonary embolisms.  No shortness of breath.       Objective:  Physical Exam: BP 111/72   Pulse 92   Temp 98.2 F (36.8 C) (Temporal)   Ht 6\' 1"  (1.854 m)   Wt 284 lb 9.6 oz (129.1 kg)   SpO2 99%   BMI 37.55 kg/m   Gen: No acute distress, resting comfortably CV: Regular rate and rhythm with no murmurs appreciated Pulm: Normal  work of breathing, clear to auscultation bilaterally with no crackles, wheezes, or rhonchi Chest: No deformities.  Nontender to palpation Neuro: Grossly normal, moves all extremities Psych: Normal affect and thought content  EKG: Normal sinus rhythm.  No ischemic changes.      Algis Greenhouse. Jerline Pain, MD 01/09/2021 12:25 PM

## 2021-01-10 ENCOUNTER — Ambulatory Visit: Payer: BC Managed Care – PPO | Admitting: Endocrinology

## 2021-01-10 ENCOUNTER — Encounter: Payer: Self-pay | Admitting: Endocrinology

## 2021-01-10 DIAGNOSIS — E042 Nontoxic multinodular goiter: Secondary | ICD-10-CM | POA: Insufficient documentation

## 2021-01-10 LAB — T4, FREE: Free T4: 0.86 ng/dL (ref 0.60–1.60)

## 2021-01-10 LAB — TSH: TSH: 0.91 u[IU]/mL (ref 0.35–4.50)

## 2021-01-10 NOTE — Progress Notes (Signed)
Subjective:    Patient ID: Wesley Hancock, male    DOB: 12/23/1970, 50 y.o.   MRN: [id number]  HPI Pt is referred by Dr Jerline Pain, for MNG.  Pt was noted to have a thyroid nodule in 2021.  He is unaware of ever having had thyroid problems in the past.  He has no h/o XRT or surgery to the neck.  He attributes dysphagia to esoph spasm.  He attributes doe to pulm dz.  He has weight gain.   Past Medical History:  Diagnosis Date  . Acute nonintractable headache 09/06/2019  . Acute respiratory failure with hypoxia ([place]) 08/30/2019  . AKI (acute kidney injury) ([place]) 08/30/2019  . Antiphospholipid syndrome ([place]) 10/30/2019  . Anxiety   . APS (antiphospholipid syndrome) ([place])   . Arthritis   . Colon polyp    ? hyperplastic  . Depression   . Diarrhea 05/21/2020  . DVT (deep venous thrombosis) ([place])   . Dyspnea on exertion 09/08/2017   "Quit smoking" 2015 with onset of symptoms in 2016  Spirometry 09/08/2017  Flat f/v loop  - d/c acei 09/08/2017  - 01/04/2020   Walked RA x two laps =  approx 553ft @ fast pace - stopped due to end of study/ min sob with sats of 93 % at the end of the study. - PFT's  03/08/20   FEV1 3.84 (83 % ) ratio 0..90  p 3 % improvement from saba p ? prior to study with DLCO  26.46 (77%) corrects to 4.76 (99%)    . Esophageal spasm   . Essential hypertension 09/23/2014   D/c acei 09/08/2017 due to pseudocopd   . GAD (generalized anxiety disorder) 08/06/2016  . Lobar pneumonia ([place]) 08/30/2019  . Lung nodule 09/21/2014  . Mixed hyperlipidemia 09/18/2015  . Morbid obesity due to excess calories ([place]) c/b hbp/ dvt/PE 09/10/2019  . 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 Nutcracker esophagus   . Paresthesia of right foot 11/08/2019  . Pulmonary infiltrates 09/10/2019   In setting of bilateral PE  08/23/2019 with antiphospholipid syndrome   . Recurrent pneumonia 07/09/2020   Formatting of this note might be different from the original. 06/26/20, 03/08/20  . Snoring 07/09/2020  . Upper airway cough syndrome 01/05/2020   Onset mid Jan 2021 while on otc PPI  - max rx for gerd 01/04/2020 >>>     . Witnessed apneic spells 07/09/2020    Past Surgical History:  Procedure Laterality Date  . CHOLECYSTECTOMY      Social History   Socioeconomic History  . Marital status: Married    Spouse name: Not on file  . Number of children: 3  . Years of education: Not on file  . Highest education level: Not on file  Occupational History  . Occupation: Truck Geophysicist/field seismologist  Tobacco Use  . Smoking status: Former Smoker    Packs/day: 1.00    Years: 20.00    Pack years: 20.00    Types: Cigarettes    Quit date: 11/30/2013    Years since quitting: 7.1  . Smokeless tobacco: Former Systems developer    Types: Snuff    Quit date: 05/21/2018  Vaping Use  . Vaping Use: Never used  Substance and Sexual Activity  . Alcohol use: Yes    Alcohol/week: 5.0 standard  drinks    Types: 5 Cans of beer per week    Comment: 1 per day  . Drug use: No  . Sexual activity: Yes  Other Topics Concern  . Not on file  Social History Narrative   Married (03/2016)   Lives in [place], [place] (Originally from [place] )   Some family in [place] live in [place].   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   Did play minor league baseball. ([place]   Likes to hunt, fish and plays in competitive soft ball league (slow pitch)   Interest includes trips to [employer] and collecting colorful socks.   Social Determinants of Health   Financial Resource Strain: Not on file  Food Insecurity: Not on file  Transportation Needs: Not on file  Physical Activity: Not on file  Stress: Not on file  Social Connections: Not on file  Intimate Partner Violence: Not on file    Current Outpatient Medications on File  Prior to Visit  Medication Sig Dispense Refill  . acetaminophen (TYLENOL) 500 MG tablet Take 1,000 mg by mouth every 6 (six) hours as needed for mild pain.    Marland Kitchen azithromycin (ZITHROMAX) 250 MG tablet Take 2 tabs day 1, then 1 tab daily 6 each 0  . buPROPion (WELLBUTRIN XL) 150 MG 24 hr tablet Take 1 tablet daily 90 tablet 3  . enoxaparin (LOVENOX) 120 MG/0.8ML injection Inject 0.8 mLs (120 mg total) into the skin every 12 (twelve) hours. 80 mL 1  . escitalopram (LEXAPRO) 20 MG tablet TAKE 1 TABLET BY MOUTH EVERY DAY 90 tablet 1  . fluticasone (FLONASE) 50 MCG/ACT nasal spray Place 1 spray into both nostrils daily.     Marland Kitchen ketoconazole (NIZORAL) 2 % cream APPLY TO AFFECTED AREA 1-2 TIMES DAILY 15 g 2  . nitroGLYCERIN (NITROSTAT) 0.4 MG SL tablet Place 1 tablet (0.4 mg total) under the tongue every 5 (five) minutes as needed for chest pain. 15 tablet 2  . pantoprazole (PROTONIX) 40 MG tablet Take 1 tablet (40 mg total) by mouth 2 (two) times daily. 180 tablet 1  . Probiotic Product (PROBIOTIC PO) Take 1 capsule by mouth daily.    Marland Kitchen telmisartan-hydrochlorothiazide (MICARDIS HCT) 80-25 MG tablet TAKE 1 TABLET BY MOUTH EVERY DAY 90 tablet 2  . ezetimibe (ZETIA) 10 MG tablet Take 1 tablet (10 mg total) by mouth daily. 90 tablet 3  . rosuvastatin (CRESTOR) 20 MG tablet Take 1 tablet (20 mg total) by mouth daily. 90 tablet 3   No current facility-administered medications on file prior to visit.    Allergies  Allergen Reactions  . Diltiazem Hcl Diarrhea and Other (See Comments)    Lethargic     Family History  Problem Relation Age of Onset  . Diabetes Mother   . Hyperlipidemia Mother   . Hypertension Mother   . Thyroid disease Mother        uncertain type--had surg--no cancer  . Heart disease Father 80  . Hyperlipidemia Father   . Hypertension Father   . Prostate cancer Father 50  . Lung cancer Father 63       Dx 06/18/2017  . Colon polyps Father   . Irritable bowel syndrome Father   .  Diverticulitis Father   . Diabetes Sister   . Hyperlipidemia Sister   . Hypertension Sister   . Diabetes Maternal Grandmother   . Heart disease Maternal Grandmother   .  Hyperlipidemia Maternal Grandmother   . Hypertension Maternal Grandmother   . Colon cancer Maternal Grandmother   . Heart disease Maternal Grandfather   . Hyperlipidemia Maternal Grandfather   . Hypertension Maternal Grandfather   . Stroke Maternal Grandfather   . Liver cancer Maternal Grandfather   . Irritable bowel syndrome Maternal Grandfather   . Heart disease Paternal Grandmother     BP 140/90 (BP Location: Right Arm, Patient Position: Sitting, Cuff Size: Large)   Pulse (!) 107   Ht 6\' 2"  (1.88 m)   Wt 280 lb (127 kg)   SpO2 95%   BMI 35.95 kg/m    Review of Systems Denies hoarseness, diarrhea, flushing, and cold intolerance.      Objective:   Physical Exam VITAL SIGNS:  See vs page GENERAL: no distress NECK: thyroid is slightly enlarged (R>L).  No thyroid nodule is palpable.  No palpable lymphadenopathy at the anterior neck.     [place] (2022): multinodular goiter.  Nodule #4 meets criteria for bx. Nodules #2 and #3 both meet criteria for 1 year follow-up.   I have reviewed outside records, and summarized: Pt was noted to have MNG, and referred here.  He had chest CT to r/o PE, and goiter was incidentally noted  Lab Results  Component Value Date   TSH 0.91 01/10/2021      Assessment & Plan:  MNG, new to me: uncertain etiology and prognosis   Patient Instructions  Blood tests are requested for you today.  We'll let you know about the results.  If the thyroid is normal or low, you should have a biopsy, guided by the ultrasound. If it is overactive, you should have a nuclear medicine scan.   Please come back for a follow-up appointment in 6 months.

## 2021-01-10 NOTE — Patient Instructions (Signed)
Blood tests are requested for you today.  We'll let you know about the results.  If the thyroid is normal or low, you should have a biopsy, guided by the ultrasound. If it is overactive, you should have a nuclear medicine scan.   Please come back for a follow-up appointment in 6 months.

## 2021-01-13 ENCOUNTER — Emergency Department (HOSPITAL_COMMUNITY): Payer: BC Managed Care – PPO

## 2021-01-13 ENCOUNTER — Inpatient Hospital Stay (HOSPITAL_COMMUNITY)
Admission: EM | Admit: 2021-01-13 | Discharge: 2021-01-16 | DRG: 177 | Disposition: A | Discharge status: Home / Self Care | Payer: BC Managed Care – PPO | Attending: Internal Medicine | Admitting: Internal Medicine

## 2021-01-13 ENCOUNTER — Other Ambulatory Visit: Payer: Self-pay

## 2021-01-13 ENCOUNTER — Encounter (HOSPITAL_COMMUNITY): Payer: Self-pay | Admitting: Emergency Medicine

## 2021-01-13 DIAGNOSIS — Z83438 Family history of other disorder of lipoprotein metabolism and other lipidemia: Secondary | ICD-10-CM | POA: Diagnosis not present

## 2021-01-13 DIAGNOSIS — Z888 Allergy status to other drugs, medicaments and biological substances status: Secondary | ICD-10-CM

## 2021-01-13 DIAGNOSIS — F411 Generalized anxiety disorder: Secondary | ICD-10-CM | POA: Diagnosis present

## 2021-01-13 DIAGNOSIS — Z79899 Other long term (current) drug therapy: Secondary | ICD-10-CM | POA: Diagnosis not present

## 2021-01-13 DIAGNOSIS — R000 Tachycardia, unspecified: Secondary | ICD-10-CM | POA: Diagnosis not present

## 2021-01-13 DIAGNOSIS — R0602 Shortness of breath: Secondary | ICD-10-CM

## 2021-01-13 DIAGNOSIS — J849 Interstitial pulmonary disease, unspecified: Secondary | ICD-10-CM | POA: Diagnosis not present

## 2021-01-13 DIAGNOSIS — D6861 Antiphospholipid syndrome: Secondary | ICD-10-CM | POA: Diagnosis present

## 2021-01-13 DIAGNOSIS — J1282 Pneumonia due to coronavirus disease 2019: Secondary | ICD-10-CM | POA: Diagnosis present

## 2021-01-13 DIAGNOSIS — E042 Nontoxic multinodular goiter: Secondary | ICD-10-CM | POA: Diagnosis present

## 2021-01-13 DIAGNOSIS — R918 Other nonspecific abnormal finding of lung field: Secondary | ICD-10-CM

## 2021-01-13 DIAGNOSIS — J9601 Acute respiratory failure with hypoxia: Secondary | ICD-10-CM | POA: Diagnosis present

## 2021-01-13 DIAGNOSIS — Z86718 Personal history of other venous thrombosis and embolism: Secondary | ICD-10-CM

## 2021-01-13 DIAGNOSIS — Z823 Family history of stroke: Secondary | ICD-10-CM | POA: Diagnosis not present

## 2021-01-13 DIAGNOSIS — Z7902 Long term (current) use of antithrombotics/antiplatelets: Secondary | ICD-10-CM | POA: Diagnosis not present

## 2021-01-13 DIAGNOSIS — U071 COVID-19: Principal | ICD-10-CM | POA: Diagnosis present

## 2021-01-13 DIAGNOSIS — G4733 Obstructive sleep apnea (adult) (pediatric): Secondary | ICD-10-CM | POA: Diagnosis present

## 2021-01-13 DIAGNOSIS — Z86711 Personal history of pulmonary embolism: Secondary | ICD-10-CM

## 2021-01-13 DIAGNOSIS — K760 Fatty (change of) liver, not elsewhere classified: Secondary | ICD-10-CM | POA: Diagnosis present

## 2021-01-13 DIAGNOSIS — N182 Chronic kidney disease, stage 2 (mild): Secondary | ICD-10-CM | POA: Diagnosis present

## 2021-01-13 DIAGNOSIS — Z720 Tobacco use: Secondary | ICD-10-CM

## 2021-01-13 DIAGNOSIS — E782 Mixed hyperlipidemia: Secondary | ICD-10-CM

## 2021-01-13 DIAGNOSIS — J189 Pneumonia, unspecified organism: Secondary | ICD-10-CM | POA: Diagnosis present

## 2021-01-13 DIAGNOSIS — E785 Hyperlipidemia, unspecified: Secondary | ICD-10-CM | POA: Diagnosis present

## 2021-01-13 DIAGNOSIS — I129 Hypertensive chronic kidney disease with stage 1 through stage 4 chronic kidney disease, or unspecified chronic kidney disease: Secondary | ICD-10-CM | POA: Diagnosis not present

## 2021-01-13 DIAGNOSIS — R079 Chest pain, unspecified: Secondary | ICD-10-CM | POA: Diagnosis not present

## 2021-01-13 DIAGNOSIS — Z6836 Body mass index (BMI) 36.0-36.9, adult: Secondary | ICD-10-CM

## 2021-01-13 DIAGNOSIS — E041 Nontoxic single thyroid nodule: Secondary | ICD-10-CM | POA: Diagnosis not present

## 2021-01-13 DIAGNOSIS — R739 Hyperglycemia, unspecified: Secondary | ICD-10-CM

## 2021-01-13 DIAGNOSIS — Z8249 Family history of ischemic heart disease and other diseases of the circulatory system: Secondary | ICD-10-CM

## 2021-01-13 DIAGNOSIS — K219 Gastro-esophageal reflux disease without esophagitis: Secondary | ICD-10-CM | POA: Diagnosis present

## 2021-01-13 DIAGNOSIS — F419 Anxiety disorder, unspecified: Secondary | ICD-10-CM | POA: Diagnosis present

## 2021-01-13 DIAGNOSIS — Z833 Family history of diabetes mellitus: Secondary | ICD-10-CM | POA: Diagnosis not present

## 2021-01-13 DIAGNOSIS — F32A Depression, unspecified: Secondary | ICD-10-CM | POA: Diagnosis present

## 2021-01-13 DIAGNOSIS — R7303 Prediabetes: Secondary | ICD-10-CM | POA: Diagnosis not present

## 2021-01-13 DIAGNOSIS — Z9989 Dependence on other enabling machines and devices: Secondary | ICD-10-CM | POA: Diagnosis not present

## 2021-01-13 LAB — RESP PANEL BY RT-PCR (FLU A&B, COVID) ARPGX2
Influenza A by PCR: NEGATIVE
Influenza B by PCR: NEGATIVE
SARS Coronavirus 2 by RT PCR: POSITIVE — AB

## 2021-01-13 LAB — HEPATIC FUNCTION PANEL
ALT: 61 U/L — ABNORMAL HIGH (ref 0–44)
AST: 31 U/L (ref 15–41)
Albumin: 3.8 g/dL (ref 3.5–5.0)
Alkaline Phosphatase: 46 U/L (ref 38–126)
Bilirubin, Direct: 0.1 mg/dL (ref 0.0–0.2)
Indirect Bilirubin: 0.7 mg/dL (ref 0.3–0.9)
Total Bilirubin: 0.8 mg/dL (ref 0.3–1.2)
Total Protein: 7.6 g/dL (ref 6.5–8.1)

## 2021-01-13 LAB — CBC
HCT: 42.7 % (ref 39.0–52.0)
Hemoglobin: 14.6 g/dL (ref 13.0–17.0)
MCH: 30.8 pg (ref 26.0–34.0)
MCHC: 34.2 g/dL (ref 30.0–36.0)
MCV: 90.1 fL (ref 80.0–100.0)
Platelets: 212 10*3/uL (ref 150–400)
RBC: 4.74 MIL/uL (ref 4.22–5.81)
RDW: 12.5 % (ref 11.5–15.5)
WBC: 10.1 10*3/uL (ref 4.0–10.5)
nRBC: 0 % (ref 0.0–0.2)

## 2021-01-13 LAB — RESPIRATORY PANEL BY PCR

## 2021-01-13 LAB — BASIC METABOLIC PANEL
Anion gap: 8 (ref 5–15)
BUN: 21 mg/dL — ABNORMAL HIGH (ref 6–20)
CO2: 31 mmol/L (ref 22–32)
Calcium: 9.6 mg/dL (ref 8.9–10.3)
Chloride: 101 mmol/L (ref 98–111)
Creatinine, Ser: 1.33 mg/dL — ABNORMAL HIGH (ref 0.61–1.24)
GFR, Estimated: >60 mL/min (ref >60)
Glucose, Bld: 95 mg/dL (ref 70–99)
Potassium: 4.4 mmol/L (ref 3.5–5.1)
Sodium: 140 mmol/L (ref 135–145)

## 2021-01-13 LAB — TROPONIN I (HIGH SENSITIVITY)
Troponin I (High Sensitivity): 9 ng/L (ref <18)
Troponin I (High Sensitivity): 8 ng/L (ref <18)

## 2021-01-13 LAB — BRAIN NATRIURETIC PEPTIDE: B Natriuretic Peptide: 29 pg/mL (ref 0.0–100.0)

## 2021-01-13 LAB — LIPASE, BLOOD: Lipase: 42 U/L (ref 11–51)

## 2021-01-13 LAB — C-REACTIVE PROTEIN: CRP: 2.5 mg/dL — ABNORMAL HIGH (ref <1.0)

## 2021-01-13 LAB — SEDIMENTATION RATE: Sed Rate: 40 mm/hr — ABNORMAL HIGH (ref 0–16)

## 2021-01-13 MED ORDER — ACETAMINOPHEN 325 MG PO TABS
650.0000 mg | ORAL_TABLET | Freq: Four times a day (QID) | ORAL | Status: DC | PRN
  Administered 2021-01-13 – 2021-01-15 (×5): 650 mg via ORAL
  Filled 2021-01-13 (×5): qty 2

## 2021-01-13 MED ORDER — MORPHINE SULFATE (PF) 4 MG/ML IV SOLN
4.0000 mg | Freq: Once | INTRAVENOUS | Status: AC
  Administered 2021-01-13: 4 mg via INTRAVENOUS
  Filled 2021-01-13: qty 1

## 2021-01-13 MED ORDER — TELMISARTAN-HCTZ 80-25 MG PO TABS: 1.0000 | ORAL_TABLET | Freq: Every day | ORAL | Status: DC

## 2021-01-13 MED ORDER — ESCITALOPRAM OXALATE 20 MG PO TABS
20.0000 mg | ORAL_TABLET | Freq: Every day | ORAL | Status: DC
  Administered 2021-01-14 – 2021-01-16 (×3): 20 mg via ORAL
  Filled 2021-01-13 (×2): qty 1
  Filled 2021-01-13: qty 2

## 2021-01-13 MED ORDER — ACETAMINOPHEN 650 MG RE SUPP: 650.0000 mg | Freq: Four times a day (QID) | RECTAL | Status: DC | PRN

## 2021-01-13 MED ORDER — POLYETHYLENE GLYCOL 3350 17 G PO PACK: 17.0000 g | PACK | Freq: Every day | ORAL | Status: DC | PRN

## 2021-01-13 MED ORDER — BUPROPION HCL ER (XL) 150 MG PO TB24
150.0000 mg | ORAL_TABLET | Freq: Every day | ORAL | Status: DC
Start: 2021-01-13 — End: 2021-01-16
  Administered 2021-01-14 – 2021-01-16 (×3): 150 mg via ORAL
  Filled 2021-01-13 (×3): qty 1

## 2021-01-13 MED ORDER — METOPROLOL TARTRATE 5 MG/5ML IV SOLN
5.0000 mg | Freq: Four times a day (QID) | INTRAVENOUS | Status: DC | PRN
Start: 2021-01-13 — End: 2021-01-16
  Administered 2021-01-13 – 2021-01-16 (×2): 5 mg via INTRAVENOUS
  Filled 2021-01-13 (×2): qty 5

## 2021-01-13 MED ORDER — SODIUM CHLORIDE 0.9% FLUSH
3.0000 mL | Freq: Two times a day (BID) | INTRAVENOUS | Status: DC
  Administered 2021-01-13 – 2021-01-16 (×6): 3 mL via INTRAVENOUS

## 2021-01-13 MED ORDER — IOHEXOL 350 MG/ML SOLN
100.0000 mL | Freq: Once | INTRAVENOUS | Status: AC | PRN
  Administered 2021-01-13: 100 mL via INTRAVENOUS

## 2021-01-13 MED ORDER — SODIUM CHLORIDE 0.9 % IV BOLUS
1000.0000 mL | Freq: Once | INTRAVENOUS | Status: AC
  Administered 2021-01-13: 1000 mL via INTRAVENOUS

## 2021-01-13 MED ORDER — PANTOPRAZOLE SODIUM 40 MG PO TBEC
40.0000 mg | DELAYED_RELEASE_TABLET | Freq: Two times a day (BID) | ORAL | Status: DC
  Administered 2021-01-13 – 2021-01-16 (×6): 40 mg via ORAL
  Filled 2021-01-13 (×6): qty 1

## 2021-01-13 MED ORDER — ENOXAPARIN SODIUM 120 MG/0.8ML ~~LOC~~ SOLN
120.0000 mg | Freq: Two times a day (BID) | SUBCUTANEOUS | Status: DC
  Administered 2021-01-13 – 2021-01-16 (×5): 120 mg via SUBCUTANEOUS
  Filled 2021-01-13 (×7): qty 0.8

## 2021-01-13 NOTE — ED Triage Notes (Signed)
Pt reports since Wed last week having left sided chest pains. Sw his PCP who had an EKG which was fine. Pt reports pain gotten worse radiating to back and having SOB. Reports has blood clotting disorder and hx PEs and these symptoms feel similar. Pt reports takes Lovenox.

## 2021-01-13 NOTE — ED Notes (Signed)
Care assumed. Report received from [place], [place]. Water provided to pt. Pt aware he is NPO after midnight

## 2021-01-13 NOTE — ED Provider Notes (Signed)
[facility] DEPT Provider Note   CSN: [id number] Arrival date & time: 01/13/21  0857     History Chief Complaint  Wesley Hancock presents with  . Chest Pain  . Shortness of Breath    Wesley Hancock is a 50 y.o. male history of antiphospholipid syndrome, pulmonary embolus, DVT, hypertension, esophageal spasms, goiter, CKD, obesity.  Wesley Hancock presents today for chest pain onset 5 days ago initially began in the center of Wesley Hancock chest a cramping sensation has since radiated to Wesley Hancock left upper chest and left upper back now described as intermittent sharp pain moderate intensity worsened with deep breathing no alleviating factors.  Wesley Hancock reports pain feels similar to previous pulmonary embolism.  Wesley Hancock reports Wesley Hancock has been compliant with Wesley Hancock Lovenox therapy.  Denies fever/chills, cough/hemoptysis, abdominal pain, nausea/vomiting, diarrhea, extremity swelling/color change, fall/injury or any additional concerns.  HPI     Past Medical History:  Diagnosis Date  . Acute nonintractable headache 09/06/2019  . Acute respiratory failure with hypoxia ([place]) 08/30/2019  . AKI (acute kidney injury) ([place]) 08/30/2019  . Antiphospholipid syndrome ([place]) 10/30/2019  . Anxiety   . APS (antiphospholipid syndrome) ([place])   . Arthritis   . Colon polyp    ? hyperplastic  . Depression   . Diarrhea 05/21/2020  . DVT (deep venous thrombosis) ([place])   . Dyspnea on exertion 09/08/2017   "Quit smoking" 2015 with onset of symptoms in 2016  Spirometry 09/08/2017  Flat f/v loop  - d/c acei 09/08/2017  - 01/04/2020   Walked RA x two laps =  approx 541ft @ fast pace - stopped due to end of study/ min sob with sats of 93 % at the end of the study. - PFT's  03/08/20   FEV1 3.84 (83 % ) ratio 0..90  p 3 % improvement from saba p ? prior to study with DLCO  26.46 (77%) corrects to 4.76 (99%)    . Esophageal spasm   . Essential hypertension 09/23/2014   D/c acei 09/08/2017 due to pseudocopd   . GAD  (generalized anxiety disorder) 08/06/2016  . Lobar pneumonia ([place]) 08/30/2019  . Lung nodule 09/21/2014  . Mixed hyperlipidemia 09/18/2015  . Morbid obesity due to excess calories ([place]) c/b hbp/ dvt/PE 09/10/2019  . 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 Nutcracker esophagus   . Paresthesia of right foot 11/08/2019  . Pulmonary infiltrates 09/10/2019   In setting of bilateral PE 08/23/2019 with antiphospholipid syndrome   . Recurrent pneumonia 07/09/2020   Formatting of this note might be different from the original. 06/26/20, 03/08/20  . Snoring 07/09/2020  . Upper airway cough syndrome 01/05/2020   Onset mid Jan 2021 while on otc PPI  - max rx for gerd 01/04/2020 >>>     . Witnessed apneic spells 07/09/2020    Wesley Hancock Active Problem List   Diagnosis Date Noted  . Multinodular goiter 01/10/2021  . OSA on CPAP 10/11/2020  . Esophageal spasm   . Colon polyp   . Arthritis   . APS (antiphospholipid syndrome) ([place])   . Anxiety   . CKD (chronic kidney disease) stage 2, GFR 60-89 ml/min 07/11/2020  . History of pulmonary embolism 07/09/2020  . Recurrent pneumonia 07/09/2020  . Snoring 07/09/2020  . Witnessed apneic spells 07/09/2020  . Pneumonia 06/26/2020  .  Diarrhea 05/21/2020  . Upper airway cough syndrome 01/05/2020  . Paresthesia of right foot 11/08/2019  . Antiphospholipid syndrome ([place]) 10/30/2019  . Pulmonary infiltrates 09/10/2019  . Morbid obesity due to excess calories ([place]) c/b hbp/ dvt/PE 09/10/2019  . Acute nonintractable headache 09/06/2019  . Lobar pneumonia ([place]) 08/30/2019  . Acute respiratory failure with hypoxia ([place]) 08/30/2019  . AKI (acute kidney injury) ([place]) 08/30/2019  . DVT (deep venous thrombosis) ([place]) 08/30/2019  . Multiple pulmonary emboli ([place]) 08/22/2019  . Dyspnea on  exertion 09/08/2017  . Depression 06/24/2017  . GAD (generalized anxiety disorder) 08/06/2016  . Mixed hyperlipidemia 09/18/2015  . Hypertensive heart disease 09/23/2014  . Nutcracker esophagus 09/21/2014  . Lung nodule 09/21/2014    Past Surgical History:  Procedure Laterality Date  . CHOLECYSTECTOMY         Family History  Problem Relation Age of Onset  . Diabetes Mother   . Hyperlipidemia Mother   . Hypertension Mother   . Thyroid disease Mother        uncertain type--had surg--no cancer  . Heart disease Father 17  . Hyperlipidemia Father   . Hypertension Father   . Prostate cancer Father 30  . Lung cancer Father 84       Dx 06/18/2017  . Colon polyps Father   . Irritable bowel syndrome Father   . Diverticulitis Father   . Diabetes Sister   . Hyperlipidemia Sister   . Hypertension Sister   . Diabetes Maternal Grandmother   . Heart disease Maternal Grandmother   . Hyperlipidemia Maternal Grandmother   . Hypertension Maternal Grandmother   . Colon cancer Maternal Grandmother   . Heart disease Maternal Grandfather   . Hyperlipidemia Maternal Grandfather   . Hypertension Maternal Grandfather   . Stroke Maternal Grandfather   . Liver cancer Maternal Grandfather   . Irritable bowel syndrome Maternal Grandfather   . Heart disease Paternal Grandmother     Social History   Tobacco Use  . Smoking status: Former Smoker    Packs/day: 1.00    Years: 20.00    Pack years: 20.00    Types: Cigarettes    Quit date: 11/30/2013    Years since quitting: 7.1  . Smokeless tobacco: Former Systems developer    Types: Snuff    Quit date: 05/21/2018  Vaping Use  . Vaping Use: Never used  Substance Use Topics  . Alcohol use: Yes    Alcohol/week: 5.0 standard drinks    Types: 5 Cans of beer per week    Comment: 1 per day  . Drug use: No    Home Medications Prior to Admission medications   Medication Sig Start Date End Date Taking? Authorizing Provider  acetaminophen (TYLENOL) 500 MG  tablet Take 1,000 mg by mouth every 6 (six) hours as needed for mild pain.   Yes [provider]  buPROPion (WELLBUTRIN XL) 150 MG 24 hr tablet Take 1 tablet daily 11/25/20  Yes Vivi Barrack, MD  enoxaparin (LOVENOX) 120 MG/0.8ML injection Inject 0.8 mLs (120 mg total) into the skin every 12 (twelve) hours. 09/05/20  Yes [place], Holli Humbles, NP  escitalopram (LEXAPRO) 20 MG tablet TAKE 1 TABLET BY MOUTH EVERY DAY Wesley Hancock taking differently: Take 20 mg by mouth daily. 12/30/20  Yes Inda Coke, PA  ezetimibe (ZETIA) 10 MG tablet Take 1 tablet (10 mg total) by mouth daily. 07/15/20 10/21/20 Yes Richardo Priest, MD  fluticasone (FLONASE) 50 MCG/ACT nasal spray Place 1 spray into both nostrils  daily as needed for allergies.   Yes [provider]  ibuprofen (ADVIL) 200 MG tablet Take 400 mg by mouth every 6 (six) hours as needed for fever, headache or mild pain.   Yes [provider]  nitroGLYCERIN (NITROSTAT) 0.4 MG SL tablet Place 1 tablet (0.4 mg total) under the tongue every 5 (five) minutes as needed for chest pain. 03/07/18  Yes Inda Coke, PA  pantoprazole (PROTONIX) 40 MG tablet Take 1 tablet (40 mg total) by mouth 2 (two) times daily. 03/08/20  Yes Parrett, Tammy S, NP  Probiotic Product (PROBIOTIC PO) Take 1 capsule by mouth daily.   Yes [provider]  rosuvastatin (CRESTOR) 20 MG tablet Take 1 tablet (20 mg total) by mouth daily. 07/15/20 10/13/20 Yes Richardo Priest, MD  telmisartan-hydrochlorothiazide (MICARDIS HCT) 80-25 MG tablet TAKE 1 TABLET BY MOUTH EVERY DAY Wesley Hancock taking differently: Take 1 tablet by mouth daily. 11/12/20  Yes Inda Coke, PA  azithromycin (ZITHROMAX) 250 MG tablet Take 2 tabs day 1, then 1 tab daily Wesley Hancock not taking: Reported on 01/13/2021 11/25/20   Vivi Barrack, MD  ketoconazole (NIZORAL) 2 % cream APPLY TO AFFECTED AREA 1-2 TIMES DAILY Wesley Hancock not taking: Reported on 01/13/2021 12/30/20   Inda Coke, PA     Allergies    Diltiazem hcl  Review of Systems   Review of Systems Ten systems are reviewed and are negative for acute change except as noted in the HPI  Physical Exam Updated Vital Signs BP (!) 169/110   Pulse 82   Temp 98.5 F (36.9 C) (Oral)   Resp (!) 23   SpO2 91%   Physical Exam Constitutional:      General: Wesley Hancock is not in acute distress.    Appearance: Normal appearance. Wesley Hancock is well-developed. Wesley Hancock is not ill-appearing or diaphoretic.  HENT:     Head: Normocephalic and atraumatic.  Eyes:     General: Vision grossly intact. Gaze aligned appropriately.     Pupils: Pupils are equal, round, and reactive to light.  Neck:     Trachea: Trachea and phonation normal.  Cardiovascular:     Rate and Rhythm: Normal rate and regular rhythm.     Heart sounds: Normal heart sounds.  Pulmonary:     Effort: Pulmonary effort is normal. No respiratory distress.     Breath sounds: Normal breath sounds.  Abdominal:     General: There is no distension.     Palpations: Abdomen is soft.     Tenderness: There is no abdominal tenderness. There is no guarding or rebound.  Musculoskeletal:        General: Normal range of motion.     Cervical back: Normal range of motion.  Skin:    General: Skin is warm and dry.  Neurological:     Mental Status: Wesley Hancock is alert.     GCS: GCS eye subscore is 4. GCS verbal subscore is 5. GCS motor subscore is 6.     Comments: Speech is clear and goal oriented, follows commands Major Cranial nerves without deficit, no facial droop Moves extremities without ataxia, coordination intact  Psychiatric:        Behavior: Behavior normal.     ED Results / Procedures / Treatments   Labs (all labs ordered are listed, but only abnormal results are displayed) Labs Reviewed  BASIC METABOLIC PANEL - Abnormal; Notable for the following components:      Result Value   BUN 21 (*)    Creatinine,  Ser 1.33 (*)    All other components within normal limits  HEPATIC FUNCTION  PANEL - Abnormal; Notable for the following components:   ALT 61 (*)    All other components within normal limits  RESP PANEL BY RT-PCR (FLU A&B, COVID) [id number]  RESPIRATORY PANEL BY PCR  CBC  LIPASE, BLOOD  BRAIN NATRIURETIC PEPTIDE  C-REACTIVE PROTEIN  SEDIMENTATION RATE  ANCA TITERS  MPO/PR-3 (ANCA) ANTIBODIES  ANA W/REFLEX IF POSITIVE  TROPONIN I (HIGH SENSITIVITY)  TROPONIN I (HIGH SENSITIVITY)    EKG EKG Interpretation  Date/Time:  Monday January 13 2021 09:06:47 EST Ventricular Rate:  102 PR Interval:    QRS Duration: 91 QT Interval:  337 QTC Calculation: 439 R Axis:   7 Text Interpretation: Sinus tachycardia Low voltage, precordial leads Borderline T abnormalities, anterior leads 12 Lead; Mason-Likar Sinus tachycardia, no STEMI Confirmed by Lavenia Atlas [phone]) on 01/13/2021 10:03:49 AM   Radiology DG Chest 2 View  Result Date: 01/13/2021 CLINICAL DATA:  Chest pain, shortness of breath. EXAM: CHEST - 2 VIEW COMPARISON:  June 26, 2020. FINDINGS: The heart size and mediastinal contours are within normal limits. No pneumothorax or pleural effusion is noted. Stable faint bilateral lung opacities are noted concerning for multifocal inflammation or postinfectious scarring. The visualized skeletal structures are unremarkable. IMPRESSION: Stable faint bilateral lung opacities are noted concerning for multifocal inflammation or postinfectious scarring. Electronically Signed   By: Marijo Conception M.D.   On: 01/13/2021 09:36   CT Angio Chest PE W and/or Wo Contrast  Result Date: 01/13/2021 CLINICAL DATA:  Chest pain and shortness of breath EXAM: CT ANGIOGRAPHY CHEST WITH CONTRAST TECHNIQUE: Multidetector CT imaging of the chest was performed using the standard protocol during bolus administration of intravenous contrast. Multiplanar CT image reconstructions and MIPs were obtained to evaluate the vascular anatomy. CONTRAST:  163mL OMNIPAQUE IOHEXOL 350 MG/ML SOLN COMPARISON:  CT  angiogram chest November 26, 2020; chest radiograph January 13, 2021; thyroid ultrasound January 07, 2021. Earlier chest CT examinations from July 2021 also compared. FINDINGS: Cardiovascular: There is no demonstrable pulmonary embolus. There is no thoracic aortic aneurysm or dissection. Visualized vessels appear. Note the right innominate and left common carotid arteries arise as a common trunk, an anatomic variant. There is aortic atherosclerosis. There are foci coronary artery calcification. There is no pericardial effusion or pericardial thickening. Mediastinum/Nodes: There is again noted a mass arising from the right lobe of the thyroid measuring 2.4 x 1 4 cm which was evaluated by ultrasound six days prior. There is also a 1.1 x 0.6 cm mass arising from the inferior left thyroid, stable. Multiple enlarged lymph nodes are again noted without appreciable change compared to prior CT. Largest lymph node is in the left hilar region measuring 2.2 x 1.5 cm. Most other enlarged lymph nodes measure slightly greater than 1 cm in short axis dimension. There also multiple subcentimeter lymph nodes. There is been no progression of adenopathy compared to most recent study. No esophageal lesions are evident. Lungs/Pleura: Multifocal areas of airspace opacity throughout the lungs again noted. In comparison with the most recent CT, there are multiple new areas of opacity in the upper lobes with more confluent opacity in the lower lobes. There is patchy consolidation in the lower lobes which was not present previously. No pleural effusions are evident. Upper Abdomen: There is hepatic steatosis. Liver appears prominent although incompletely visualized. The gallbladder is absent. Visualized upper abdominal structures otherwise appear normal. Musculoskeletal: No blastic or lytic bone  lesions are evident. No chest wall lesions. Review of the MIP images confirms the above findings. IMPRESSION: 1. No demonstrable pulmonary embolus.  No thoracic aortic aneurysm or dissection. There are foci of aortic atherosclerosis as well as foci of coronary artery calcification. 2. Extensive airspace opacity bilaterally with overall increased compared to December 2021. Areas of patchy consolidation noted in several areas. The overall appearance is most indicative of atypical organism pneumonia. A degree of bacterial superinfection is question. Note that there has been persistence of multifocal airspace opacity for several months. This circumstance may warrant Pulmonary Medicine consultation with consideration for bronchoscopy for further assessment with respect to etiology for the parenchymal lung lesions. 3. Stable adenopathy of uncertain etiology. Given the parenchymal lung changes, this adenopathy could have reactive etiology. 4. Hepatic steatosis. Liver appears prominent although incompletely visualized. Gallbladder absent. 5. Thyroid nodules with assessment by thyroid ultrasound 6 days prior. Please see recent thyroid ultrasound report with respect to thyroid nodular assessment. Aortic Atherosclerosis (ICD10-I70.0). Electronically Signed   By: Lowella Grip III M.D.   On: 01/13/2021 11:17    Procedures Procedures   Medications Ordered in ED Medications  sodium chloride 0.9 % bolus 1,000 mL (0 mLs Intravenous Stopped 01/13/21 1149)  morphine 4 MG/ML injection 4 mg (4 mg Intravenous Given 01/13/21 1001)  iohexol (OMNIPAQUE) 350 MG/ML injection 100 mL (100 mLs Intravenous Contrast Given 01/13/21 1049)    ED Course  I have reviewed the triage vital signs and the nursing notes.  Pertinent labs & imaging results that were available during my care of the Wesley Hancock were reviewed by me and considered in my medical decision making (see chart for details).  Clinical Course as of 01/13/21 1202  Mon Jan 13, 2021  1146 [place] [BM]    Clinical Course User Index [BM] Gari Crown   MDM Rules/Calculators/A&P                          Additional history obtained from: 1. Nursing notes from this visit. 2. Review of electronic medical records, Wesley Hancock's most recent CT angio of the chest was on 11/27/2019, did not show pulmonary embolus did show groundglass opacities and nodule along with other incidental findings. - 50 year old male history of antiphospholipid syndrome and blood clot on Lovenox and denies missed doses presents with 5 days of chest pain feels similar to previous PE.  Wesley Hancock is slightly tachycardic on arrival no hypoxia otherwise vital signs are stable.  Cardiopulmonary exam is unremarkable.  Wesley Hancock is very concerned for pulmonary embolism, chest pain work-up initiated in triage with basic labs chest x-ray and EKG.  Shared decision making made with Wesley Hancock I discussed risks versus benefits of CT imaging for evaluation of pulmonary embolism given Wesley Hancock is currently on therapy.  Wesley Hancock states understanding of risks of CT imaging and elects to proceed with CT angio PE study. - I ordered, reviewed and interpreted labs which include:  CBC without leukocytosis to suggest infectious process, no anemia. Lipase normal limits, doubt pancreatitis. BMP shows baseline creatinine at 1.33, no emergent electrolyte derangement or gap. LFTs nonacute. High-sensitivity troponin within normal limits, reassuring in the setting of multiple days of symptoms. CXR:  IMPRESSION:  Stable faint bilateral lung opacities are noted concerning for  multifocal inflammation or postinfectious scarring.   EKG: Sinus tachycardia Low voltage, precordial leads Borderline T abnormalities, anterior leads 12 Lead; Mason-Likar Sinus tachycardia, no STEMI Confirmed by Lavenia Atlas ([id number]) on 01/13/2021 10:03:49 AM  CTA Chest PE Study:  IMPRESSION:  1. No demonstrable pulmonary embolus. No thoracic aortic aneurysm or  dissection. There are foci of aortic atherosclerosis as well as foci  of coronary artery calcification.    2. Extensive airspace  opacity bilaterally with overall increased  compared to December 2021. Areas of patchy consolidation noted in  several areas. The overall appearance is most indicative of atypical  organism pneumonia. A degree of bacterial superinfection is  question. Note that there has been persistence of multifocal  airspace opacity for several months. This circumstance may warrant  Pulmonary Medicine consultation with consideration for bronchoscopy  for further assessment with respect to etiology for the parenchymal  lung lesions.    3. Stable adenopathy of uncertain etiology. Given the parenchymal  lung changes, this adenopathy could have reactive etiology.    4. Hepatic steatosis. Liver appears prominent although incompletely  visualized. Gallbladder absent.    5. Thyroid nodules with assessment by thyroid ultrasound 6 days  prior. Please see recent thyroid ultrasound report with respect to  thyroid nodular assessment.    Aortic Atherosclerosis (ICD10-I70.0).  ------------------ Wesley Hancock reassessed Wesley Hancock is resting comfortably bed no acute distress.  SPO2 low 90s not currently requiring any supplemental oxygen.  Consult with pulmonologist Dr. Erin Fulling at 11:46 PM reviewed case advises not to start Wesley Hancock on antibiotics until they evaluate the Wesley Hancock, agrees with medicine admission.  Consult with Dr. Neysa Bonito, Wesley Hancock accepted to hospitalist service. - 11:55 PM: Wesley Hancock reassessed Wesley Hancock is resting comfortably in bed no acute distress family member at bedside.  They state understanding of findings as above and are agreeable to further evaluation and admission to the hospital.  Covid test pending.  Wesley Hancock was evaluated in Emergency Department on 01/13/2021 for the symptoms described in the history of present illness. Wesley Hancock was evaluated in the context of the global COVID-19 pandemic, which necessitated consideration that the Wesley Hancock might be at risk for infection with the SARS-CoV-2 virus that  causes COVID-19. Institutional protocols and algorithms that pertain to the evaluation of patients at risk for COVID-19 are in a state of rapid change based on information released by regulatory bodies including the CDC and federal and state organizations. These policies and algorithms were followed during the Wesley Hancock's care in the ED.  Note: Portions of this report may have been transcribed using voice recognition software. Every effort was made to ensure accuracy; however, inadvertent computerized transcription errors may still be present. Final Clinical Impression(s) / ED Diagnoses Final diagnoses:  Opacity of lung on imaging study  Shortness of breath    Rx / DC Orders ED Discharge Orders    None       Deliah Boston, PA-C 01/13/21 1203    Horton, Alvin Critchley, DO 01/13/21 1525

## 2021-01-13 NOTE — H&P (Signed)
History and Physical        Hospital Admission Note Date: 01/13/2021  Patient name: Wesley Hancock Medical record number: [id number] Date of birth: Apr 17, 1971 Age: 50 y.o. Gender: male  PCP: Inda Coke, PA   Chief Complaint    Chief Complaint  Patient presents with  . Chest Pain  . Shortness of Breath      HPI:   This is a 50 year old male who has been vaccinated against COVID-19 with past medical history of antiphospholipid syndrome, DVT and PE currently on Lovenox, COVID-19 in December, hypertension, hyperlipidemia, multinodular goiter, nutcracker esophagus, CKD 2, OSA, obesity who presents to the ED with chest pain and exertional shortness of breath x5 days.  Initially started in the center of his chest as a cramping sensation and has radiated to his left upper chest and left scapula described which worsens with deep inspiration.  He was concern for recurrent PE as he had similar symptoms with prior PE.  Admits he has been compliant with his Lovenox.  Has had abnormal CT scans multiple times over the past year 2 concerning for multifocal pneumonia for which she was treated but states his symptoms never fully resolved.   He believes he has had mold exposure in his house where he and his wife moved to roughly 2 years ago, prior to the onset of symptoms.  She has not had any similar symptoms.  He denies known exposure to TB or rat droppings however he is a former Nature conservation officer.  He is a former smoker.   Father died of lung cancer and was a heavy smoker and he was exposed to heavy secondhand smoke as a child.  Denies weight loss, fevers or night sweats but does admit to itching at night.  Admits to wheezing with exertion and dyspnea on exertion and worsening symptoms with cold weather.  Recurrent symptoms started prior to COVID-19 infection in December.  Not associated with  food intake.  ED Course: Afebrile, hypertensive, SpO2 97%-> 91% on room air. Notable Labs: Sodium 140, K4.4, BUN 21, creatinine 1.33, lipase 42, AST 31, ALT 61, troponin 9, WBC 10.1, Hb 14.6, platelets 212, COVID-19 pending.  EKG with sinus tachycardia (102) and otherwise unremarkable.  Notable Imaging: CXR-stable faint bilateral lung opacities concerning for multifocal inflammation or postinfectious scarring.  CTA chest-extensive airspace opacity bilaterally increased compared to December indicative of atypical pneumonia with a degree of bacterial superinfection in question however has had persistent multifocal airspace opacity for several months, negative PE, pulmonary consult recommended by radiology.  Also stable adenopathy, hepatic steatosis, thyroid nodules and aortic atherosclerosis. Patient received morphine, 1 L NS bolus.  Pulmonary was consulted by the ED provider.    Vitals:   01/13/21 1045 01/13/21 1145  BP: (!) 170/101 (!) 169/110  Pulse: 86 82  Resp: (!) 24 (!) 23  Temp:    SpO2: 91% 91%     Review of Systems:  Review of Systems  All other systems reviewed and are negative.   Medical/Social/Family History   Past Medical History: Past Medical History:  Diagnosis Date  . Acute nonintractable headache 09/06/2019  . Acute respiratory failure with hypoxia ([place]) 08/30/2019  . AKI (acute kidney injury) ([place]) 08/30/2019  .  Antiphospholipid syndrome ([place]) 10/30/2019  . Anxiety   . APS (antiphospholipid syndrome) ([place])   . Arthritis   . Colon polyp    ? hyperplastic  . Depression   . Diarrhea 05/21/2020  . DVT (deep venous thrombosis) ([place])   . Dyspnea on exertion 09/08/2017   "Quit smoking" 2015 with onset of symptoms in 2016  Spirometry 09/08/2017  Flat f/v loop  - d/c acei 09/08/2017  - 01/04/2020   Walked RA x two laps =  approx 532ft @ fast pace - stopped due to end of study/ min sob with sats of 93 % at the end of the study. - PFT's  03/08/20   FEV1 3.84 (83 % ) ratio 0..90  p  3 % improvement from saba p ? prior to study with DLCO  26.46 (77%) corrects to 4.76 (99%)    . Esophageal spasm   . Essential hypertension 09/23/2014   D/c acei 09/08/2017 due to pseudocopd   . GAD (generalized anxiety disorder) 08/06/2016  . Lobar pneumonia ([place]) 08/30/2019  . Lung nodule 09/21/2014  . Mixed hyperlipidemia 09/18/2015  . Morbid obesity due to excess calories ([place]) c/b hbp/ dvt/PE 09/10/2019  . 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 Nutcracker esophagus   . Paresthesia of right foot 11/08/2019  . Pulmonary infiltrates 09/10/2019   In setting of bilateral PE 08/23/2019 with antiphospholipid syndrome   . Recurrent pneumonia 07/09/2020   Formatting of this note might be different from the original. 06/26/20, 03/08/20  . Snoring 07/09/2020  . Upper airway cough syndrome 01/05/2020   Onset mid Jan 2021 while on otc PPI  - max rx for gerd 01/04/2020 >>>     . Witnessed apneic spells 07/09/2020    Past Surgical History:  Procedure Laterality Date  . CHOLECYSTECTOMY      Medications: Prior to Admission medications   Medication Sig Start Date End Date Taking? Authorizing Provider  acetaminophen (TYLENOL) 500 MG tablet Take 1,000 mg by mouth every 6 (six) hours as needed for mild pain.   Yes [provider]  buPROPion (WELLBUTRIN XL) 150 MG 24 hr tablet Take 1 tablet daily 11/25/20  Yes Vivi Barrack, MD  enoxaparin (LOVENOX) 120 MG/0.8ML injection Inject 0.8 mLs (120 mg total) into the skin every 12 (twelve) hours. 09/05/20  Yes [place], Holli Humbles, NP  escitalopram (LEXAPRO) 20 MG tablet TAKE 1 TABLET BY MOUTH EVERY DAY Patient taking differently: Take 20 mg by mouth daily. 12/30/20  Yes Inda Coke, PA  ezetimibe (ZETIA) 10 MG tablet Take 1 tablet (10 mg total) by mouth daily.  07/15/20 10/21/20 Yes Richardo Priest, MD  fluticasone (FLONASE) 50 MCG/ACT nasal spray Place 1 spray into both nostrils daily as needed for allergies.   Yes [provider]  ibuprofen (ADVIL) 200 MG tablet Take 400 mg by mouth every 6 (six) hours as needed for fever, headache or mild pain.   Yes [provider]  nitroGLYCERIN (NITROSTAT) 0.4 MG SL tablet Place 1 tablet (0.4 mg total) under the tongue every 5 (five) minutes as needed for chest pain. 03/07/18  Yes Inda Coke, PA  pantoprazole (PROTONIX) 40 MG tablet Take 1 tablet (40 mg total) by mouth 2 (two) times daily. 03/08/20  Yes [place], Fonnie Mu, NP  Probiotic Product (PROBIOTIC  PO) Take 1 capsule by mouth daily.   Yes [provider]  rosuvastatin (CRESTOR) 20 MG tablet Take 1 tablet (20 mg total) by mouth daily. 07/15/20 10/13/20 Yes Richardo Priest, MD  telmisartan-hydrochlorothiazide (MICARDIS HCT) 80-25 MG tablet TAKE 1 TABLET BY MOUTH EVERY DAY Patient taking differently: Take 1 tablet by mouth daily. 11/12/20  Yes Inda Coke, PA  azithromycin (ZITHROMAX) 250 MG tablet Take 2 tabs day 1, then 1 tab daily Patient not taking: Reported on 01/13/2021 11/25/20   Vivi Barrack, MD  ketoconazole (NIZORAL) 2 % cream APPLY TO AFFECTED AREA 1-2 TIMES DAILY Patient not taking: Reported on 01/13/2021 12/30/20   Inda Coke, PA    Allergies:   Allergies  Allergen Reactions  . Diltiazem Hcl Diarrhea and Other (See Comments)    Lethargic     Social History:  reports that he quit smoking about 7 years ago. His smoking use included cigarettes. He has a 20.00 pack-year smoking history. He quit smokeless tobacco use about 2 years ago.  His smokeless tobacco use included snuff. He reports current alcohol use of about 5.0 standard drinks of alcohol per week. He reports that he does not use drugs.  Family History: Family History  Problem Relation Age of Onset  . Diabetes Mother   . Hyperlipidemia Mother   .  Hypertension Mother   . Thyroid disease Mother        uncertain type--had surg--no cancer  . Heart disease Father 34  . Hyperlipidemia Father   . Hypertension Father   . Prostate cancer Father 22  . Lung cancer Father 66       Dx 06/18/2017  . Colon polyps Father   . Irritable bowel syndrome Father   . Diverticulitis Father   . Diabetes Sister   . Hyperlipidemia Sister   . Hypertension Sister   . Diabetes Maternal Grandmother   . Heart disease Maternal Grandmother   . Hyperlipidemia Maternal Grandmother   . Hypertension Maternal Grandmother   . Colon cancer Maternal Grandmother   . Heart disease Maternal Grandfather   . Hyperlipidemia Maternal Grandfather   . Hypertension Maternal Grandfather   . Stroke Maternal Grandfather   . Liver cancer Maternal Grandfather   . Irritable bowel syndrome Maternal Grandfather   . Heart disease Paternal Grandmother      Objective   Physical Exam: Blood pressure (!) 169/110, pulse 82, temperature 98.5 F (36.9 C), temperature source Oral, resp. rate (!) 23, SpO2 91 %.  Physical Exam Vitals and nursing note reviewed.  Constitutional:      Appearance: Normal appearance.  HENT:     Head: Normocephalic and atraumatic.  Eyes:     Conjunctiva/sclera: Conjunctivae normal.  Cardiovascular:     Rate and Rhythm: Normal rate and regular rhythm.  Pulmonary:     Effort: Pulmonary effort is normal.     Breath sounds: Normal breath sounds.     Comments: SPO2 dropped from 93% briefly to 89% with conversation and resolved with rest and deep breathing Abdominal:     General: Abdomen is flat.     Palpations: Abdomen is soft.  Musculoskeletal:        General: No swelling or tenderness. Normal range of motion.     Right lower leg: No tenderness. No edema.     Left lower leg: No tenderness. No edema.     Comments: Chest pain not reproducible to palpation   Skin:    Coloration: Skin is not jaundiced or pale.  Neurological:     General: No focal  deficit present.     Mental Status: He is alert. Mental status is at baseline.  Psychiatric:        Mood and Affect: Mood normal.        Behavior: Behavior normal.     LABS on Admission: I have personally reviewed all the labs and imaging below    Basic Metabolic Panel: Recent Labs  Lab 01/13/21 0944  NA 140  K 4.4  CL 101  CO2 31  GLUCOSE 95  BUN 21*  CREATININE 1.33*  CALCIUM 9.6   Liver Function Tests: Recent Labs  Lab 01/13/21 0944  AST 31  ALT 61*  ALKPHOS 46  BILITOT 0.8  PROT 7.6  ALBUMIN 3.8   Recent Labs  Lab 01/13/21 0944  LIPASE 42   No results for input(s): AMMONIA in the last 168 hours. CBC: Recent Labs  Lab 01/13/21 0944  WBC 10.1  HGB 14.6  HCT 42.7  MCV 90.1  PLT 212   Cardiac Enzymes: No results for input(s): CKTOTAL, CKMB, CKMBINDEX, TROPONINI in the last 168 hours. BNP: Invalid input(s): POCBNP CBG: No results for input(s): GLUCAP in the last 168 hours.  Radiological Exams on Admission:  DG Chest 2 View  Result Date: 01/13/2021 CLINICAL DATA:  Chest pain, shortness of breath. EXAM: CHEST - 2 VIEW COMPARISON:  June 26, 2020. FINDINGS: The heart size and mediastinal contours are within normal limits. No pneumothorax or pleural effusion is noted. Stable faint bilateral lung opacities are noted concerning for multifocal inflammation or postinfectious scarring. The visualized skeletal structures are unremarkable. IMPRESSION: Stable faint bilateral lung opacities are noted concerning for multifocal inflammation or postinfectious scarring. Electronically Signed   By: Marijo Conception M.D.   On: 01/13/2021 09:36   CT Angio Chest PE W and/or Wo Contrast  Result Date: 01/13/2021 CLINICAL DATA:  Chest pain and shortness of breath EXAM: CT ANGIOGRAPHY CHEST WITH CONTRAST TECHNIQUE: Multidetector CT imaging of the chest was performed using the standard protocol during bolus administration of intravenous contrast. Multiplanar CT image  reconstructions and MIPs were obtained to evaluate the vascular anatomy. CONTRAST:  164mL OMNIPAQUE IOHEXOL 350 MG/ML SOLN COMPARISON:  CT angiogram chest November 26, 2020; chest radiograph January 13, 2021; thyroid ultrasound January 07, 2021. Earlier chest CT examinations from July 2021 also compared. FINDINGS: Cardiovascular: There is no demonstrable pulmonary embolus. There is no thoracic aortic aneurysm or dissection. Visualized vessels appear. Note the right innominate and left common carotid arteries arise as a common trunk, an anatomic variant. There is aortic atherosclerosis. There are foci coronary artery calcification. There is no pericardial effusion or pericardial thickening. Mediastinum/Nodes: There is again noted a mass arising from the right lobe of the thyroid measuring 2.4 x 1 4 cm which was evaluated by ultrasound six days prior. There is also a 1.1 x 0.6 cm mass arising from the inferior left thyroid, stable. Multiple enlarged lymph nodes are again noted without appreciable change compared to prior CT. Largest lymph node is in the left hilar region measuring 2.2 x 1.5 cm. Most other enlarged lymph nodes measure slightly greater than 1 cm in short axis dimension. There also multiple subcentimeter lymph nodes. There is been no progression of adenopathy compared to most recent study. No esophageal lesions are evident. Lungs/Pleura: Multifocal areas of airspace opacity throughout the lungs again noted. In comparison with the most recent CT, there are multiple new areas of opacity in the upper lobes with  more confluent opacity in the lower lobes. There is patchy consolidation in the lower lobes which was not present previously. No pleural effusions are evident. Upper Abdomen: There is hepatic steatosis. Liver appears prominent although incompletely visualized. The gallbladder is absent. Visualized upper abdominal structures otherwise appear normal. Musculoskeletal: No blastic or lytic bone lesions  are evident. No chest wall lesions. Review of the MIP images confirms the above findings. IMPRESSION: 1. No demonstrable pulmonary embolus. No thoracic aortic aneurysm or dissection. There are foci of aortic atherosclerosis as well as foci of coronary artery calcification. 2. Extensive airspace opacity bilaterally with overall increased compared to December 2021. Areas of patchy consolidation noted in several areas. The overall appearance is most indicative of atypical organism pneumonia. A degree of bacterial superinfection is question. Note that there has been persistence of multifocal airspace opacity for several months. This circumstance may warrant Pulmonary Medicine consultation with consideration for bronchoscopy for further assessment with respect to etiology for the parenchymal lung lesions. 3. Stable adenopathy of uncertain etiology. Given the parenchymal lung changes, this adenopathy could have reactive etiology. 4. Hepatic steatosis. Liver appears prominent although incompletely visualized. Gallbladder absent. 5. Thyroid nodules with assessment by thyroid ultrasound 6 days prior. Please see recent thyroid ultrasound report with respect to thyroid nodular assessment. Aortic Atherosclerosis (ICD10-I70.0). Electronically Signed   By: Lowella Grip III M.D.   On: 01/13/2021 11:17      EKG: normal EKG, normal sinus rhythm   A & P   Principal Problem:   Shortness of breath Active Problems:   Mixed hyperlipidemia   Antiphospholipid syndrome ([name])   History of pulmonary embolism   Multinodular goiter   1. Shortness of breath/chest pain of unclear etiology a. Multiple chest CTs dating back to 08/30/2019 with extensive bilateral airspace opacities consistent with multifocal pneumonia, including today's chest CT and did not improve after treatment for pneumonia b. Unlikely cardiac given duration of symptoms with negative troponin as well as negative recent stress test 08/01/2020 and  unremarkable EKG c. Desaturation with conversation to 88 to 89% on room air, resolved with rest d. DDx: Fungal (mold exposure) versus cryptogenic organizing pneumonia versus atypical infection versus rheumatologic versus less likely history of nutcracker esophagus e. May need a bronchoscopy.  Appreciate pulmonary input  2. Multinodular goiter a. Noted on recent thyroid US b. TSH unremarkable, plans to schedule outpatient FNA  3. History of DVT and PE in the setting of antiphospholipid syndrome a. Continue full dose Lovenox  4. Hypertension a. Continue home telmisartan-HCTZ  5. Hyperlipidemia a. Continue statin     DVT prophylaxis: Lovenox   Code Status: Full Code  Diet: Heart healthy Family Communication: Admission, patients condition and plan of care including tests being ordered have been discussed with the patient who indicates understanding and agrees with the plan and Code Status. Patient's wife was updated  Disposition Plan: The appropriate patient status for this patient is OBSERVATION. Observation status is judged to be reasonable and necessary in order to provide the required intensity of service to ensure the patient's safety. The patient's presenting symptoms, physical exam findings, and initial radiographic and laboratory data in the context of their medical condition is felt to place them at decreased risk for further clinical deterioration. Furthermore, it is anticipated that the patient will be medically stable for discharge from the hospital within 2 midnights of admission. The following factors support the patient status of observation.   " The patient's presenting symptoms include shortness of breath. " The  physical exam findings include unremarkable except for desaturation. " The initial radiographic and laboratory data are concerning for abnormal CT.    Consultants  . PCCM  Procedures  . None  Time Spent on Admission: 60 minutes    Harold Hedge,  DO [facility]  01/13/2021, 12:42 PM

## 2021-01-13 NOTE — Consult Note (Signed)
NAME:  Wesley Hancock, MRN:  [id number], DOB:  December 11, 1970, LOS: 0 ADMISSION DATE:  01/13/2021, CONSULTATION DATE:  2/14 REFERRING MD:  Neysa Bonito, CHIEF COMPLAINT:  Recurrent PNA     History of Present Illness:  50 year old male w/ sig hx as outlined below. Presents to the ER 2/14 w/ cc: chest pain and worsening shortness of breath over 5d period. Describes the CP as center of chest, some radiation to left scapula and arm but much worse pain w/ deep breath. No cough no sick exposure, No HA, sore throat, nasal dc. No wheeze. No LE swelling. In the ER he had CT scan as presentation was similar to prior presentation when he had PE. He was negative for PE but had diffuse bilateral pulmonary infiltrates which have been recurrent in nature and were the reason for our consult.  Past Medical History:  Antiphospholipid syndrome, prior PE and DVT on chronic LMWH, recurrent PNA, HTN, HL, multinodular goiter, nutcracker esophagus, CKD stage II, OSA obesity   Significant Hospital Events:  2/14 admitted  Consults:  Pulmonary consult   Procedures:    Significant Diagnostic Tests:  CT angiogram 2/14: 1. No demonstrable pulmonary embolus. No thoracic aortic aneurysm or dissection. There are foci of aortic atherosclerosis as well as foci of coronary artery calcification.2. Extensive airspace opacity bilaterally with overall increased compared to December 2021. Areas of patchy consolidation noted in several areas. The overall appearance is most indicative of atypical organism pneumonia. A degree of bacterial superinfection is question. Note that there has been persistence of multifocal airspace opacity for several months. Micro Data:  COVID 2/14  Antimicrobials:    Interim History / Subjective:  No distress  Objective   Blood pressure (Abnormal) 145/94, pulse 98, temperature 98.5 F (36.9 C), temperature source Oral, resp. rate (Abnormal) 25, SpO2 93 %.        Intake/Output Summary (Last 24  hours) at 01/13/2021 1318 Last data filed at 01/13/2021 1149 Gross per 24 hour  Intake 1000 ml  Output no documentation  Net 1000 ml   There were no vitals filed for this visit.  Examination: General: 50 year old white male resting in bed HENT: voice is a little hoarse but no real turbulent breathing or upper airway noises  Lungs: clear dc bases. No accessory use  Cardiovascular: RRR Abdomen: soft not tender  Extremities: warm and dry  Neuro: awake and alert. No focal def  GU: voids   Resolved Hospital Problem list     Assessment & Plan:   Dyspnea Recurrent pulmonary infiltrates Chest pain  Esophageal spasms (H/o) Antiphospholipid syndrome H/o DVT and PE (on LMWH) H/o goiter  H/o OSA (awaiting CPAP) H/o HTN    Dyspnea and chest pain in the setting of recurrent patchy pulmonary infiltrates.  -ddx: CAP (although he does NOT look toxic), viral PNA, aspiration (but again would expect more toxic appearance). Given already known h/o APLS, raises the concern for non-infectious/autoimmune mediated process.  Plan/rec Sending RVP Sending ANCA titer, sed rate, CRP Will send HSP panel Will need bronchoscopy/lavage  to r/o infection  Would hold off on abx for now.   Best practice (evaluated daily)  Per primary   Labs   CBC: Recent Labs  Lab 01/13/21 0944  WBC 10.1  HGB 14.6  HCT 42.7  MCV 90.1  PLT 973    Basic Metabolic Panel: Recent Labs  Lab 01/13/21 0944  NA 140  K 4.4  CL 101  CO2 31  GLUCOSE 95  BUN 21*  CREATININE 1.33*  CALCIUM 9.6   GFR: Estimated Creatinine Clearance: 96.2 mL/min (A) (by C-G formula based on SCr of 1.33 mg/dL (H)). Recent Labs  Lab 01/13/21 0944  WBC 10.1    Liver Function Tests: Recent Labs  Lab 01/13/21 0944  AST 31  ALT 61*  ALKPHOS 46  BILITOT 0.8  PROT 7.6  ALBUMIN 3.8   Recent Labs  Lab 01/13/21 0944  LIPASE 42   No results for input(s): AMMONIA in the last 168 hours.  ABG    Component Value  Date/Time   TCO2 26 08/30/2019 1329     Coagulation Profile: No results for input(s): INR, PROTIME in the last 168 hours.  Cardiac Enzymes: No results for input(s): CKTOTAL, CKMB, CKMBINDEX, TROPONINI in the last 168 hours.  HbA1C: No results found for: HGBA1C  CBG: No results for input(s): GLUCAP in the last 168 hours.  Review of Systems:   Review of Systems  Constitutional: Negative for diaphoresis, fever and malaise/fatigue.  HENT: Negative for congestion, sinus pain and sore throat.   Eyes: Negative.   Respiratory: Positive for shortness of breath. Negative for cough, hemoptysis, wheezing and stridor.   Cardiovascular: Positive for chest pain. Negative for leg swelling.  Gastrointestinal: Positive for abdominal pain and heartburn.  Genitourinary: Negative.   Musculoskeletal: Negative.   Skin: Negative.   Neurological: Negative.   Endo/Heme/Allergies: Negative.   Psychiatric/Behavioral: Negative.      Past Medical History:  He,  has a past medical history of Acute nonintractable headache (09/06/2019), Acute respiratory failure with hypoxia ([place]) (08/30/2019), AKI (acute kidney injury) ([place]) (08/30/2019), Antiphospholipid syndrome ([place]) (10/30/2019), Anxiety, APS (antiphospholipid syndrome) ([place]), Arthritis, Colon polyp, Depression, Diarrhea (05/21/2020), DVT (deep venous thrombosis) ([place]), Dyspnea on exertion (09/08/2017), Esophageal spasm, Essential hypertension (09/23/2014), GAD (generalized anxiety disorder) (08/06/2016), Lobar pneumonia ([place]) (08/30/2019), Lung nodule (09/21/2014), Mixed hyperlipidemia (09/18/2015), Morbid obesity due to excess calories ([name]) c/b hbp/ dvt/PE (09/10/2019), Multiple pulmonary emboli ([place]) (08/22/2019), Nutcracker esophagus, Paresthesia of right foot (11/08/2019), Pulmonary infiltrates (09/10/2019), Recurrent pneumonia (07/09/2020), Snoring (07/09/2020), Upper airway cough syndrome (01/05/2020), and Witnessed apneic spells (07/09/2020).   Surgical History:    Past Surgical History:  Procedure Laterality Date  . CHOLECYSTECTOMY       Social History:   reports that he quit smoking about 7 years ago. His smoking use included cigarettes. He has a 20.00 pack-year smoking history. He quit smokeless tobacco use about 2 years ago.  His smokeless tobacco use included snuff. He reports current alcohol use of about 5.0 standard drinks of alcohol per week. He reports that he does not use drugs.   Family History:  His family history includes Colon cancer in his maternal grandmother; Colon polyps in his father; Diabetes in his maternal grandmother, mother, and sister; Diverticulitis in his father; Heart disease in his maternal grandfather, maternal grandmother, and paternal grandmother; Heart disease (age of onset: 64) in his father; Hyperlipidemia in his father, maternal grandfather, maternal grandmother, mother, and sister; Hypertension in his father, maternal grandfather, maternal grandmother, mother, and sister; Irritable bowel syndrome in his father and maternal grandfather; Liver cancer in his maternal grandfather; Lung cancer (age of onset: 33) in his father; Prostate cancer (age of onset: 74) in his father; Stroke in his maternal grandfather; Thyroid disease in his mother.   Allergies Allergies  Allergen Reactions  . Diltiazem Hcl Diarrhea and Other (See Comments)    Lethargic      Home Medications  Prior to Admission medications  Medication Sig Start Date End Date Taking? Authorizing Provider  acetaminophen (TYLENOL) 500 MG tablet Take 1,000 mg by mouth every 6 (six) hours as needed for mild pain.   Yes [provider]  buPROPion (WELLBUTRIN XL) 150 MG 24 hr tablet Take 1 tablet daily 11/25/20  Yes Vivi Barrack, MD  enoxaparin (LOVENOX) 120 MG/0.8ML injection Inject 0.8 mLs (120 mg total) into the skin every 12 (twelve) hours. 09/05/20  Yes [place], Holli Humbles, NP  escitalopram (LEXAPRO) 20 MG tablet TAKE 1 TABLET BY MOUTH EVERY  DAY Patient taking differently: Take 20 mg by mouth daily. 12/30/20  Yes Inda Coke, PA  ezetimibe (ZETIA) 10 MG tablet Take 1 tablet (10 mg total) by mouth daily. 07/15/20 10/21/20 Yes Richardo Priest, MD  fluticasone (FLONASE) 50 MCG/ACT nasal spray Place 1 spray into both nostrils daily as needed for allergies.   Yes [provider]  ibuprofen (ADVIL) 200 MG tablet Take 400 mg by mouth every 6 (six) hours as needed for fever, headache or mild pain.   Yes [provider]  nitroGLYCERIN (NITROSTAT) 0.4 MG SL tablet Place 1 tablet (0.4 mg total) under the tongue every 5 (five) minutes as needed for chest pain. 03/07/18  Yes Inda Coke, PA  pantoprazole (PROTONIX) 40 MG tablet Take 1 tablet (40 mg total) by mouth 2 (two) times daily. 03/08/20  Yes [place], Tammy S, NP  Probiotic Product (PROBIOTIC PO) Take 1 capsule by mouth daily.   Yes [provider]  rosuvastatin (CRESTOR) 20 MG tablet Take 1 tablet (20 mg total) by mouth daily. 07/15/20 10/13/20 Yes Richardo Priest, MD  telmisartan-hydrochlorothiazide (MICARDIS HCT) 80-25 MG tablet TAKE 1 TABLET BY MOUTH EVERY DAY Patient taking differently: Take 1 tablet by mouth daily. 11/12/20  Yes Inda Coke, PA  azithromycin (ZITHROMAX) 250 MG tablet Take 2 tabs day 1, then 1 tab daily Patient not taking: Reported on 01/13/2021 11/25/20   Vivi Barrack, MD  ketoconazole (NIZORAL) 2 % cream APPLY TO AFFECTED AREA 1-2 TIMES DAILY Patient not taking: Reported on 01/13/2021 12/30/20   Inda Coke, PA     Critical care time: NA     Erick Colace ACNP-BC [place] Pager # [phone] OR # [phone] if no answer

## 2021-01-13 NOTE — ED Notes (Signed)
2L [place] applied to pt

## 2021-01-13 NOTE — ED Notes (Signed)
Pt given meal tray.

## 2021-01-14 ENCOUNTER — Observation Stay (HOSPITAL_COMMUNITY): Payer: BC Managed Care – PPO | Admitting: Certified Registered Nurse Anesthetist

## 2021-01-14 ENCOUNTER — Encounter (HOSPITAL_COMMUNITY): Payer: Self-pay | Admitting: Internal Medicine

## 2021-01-14 ENCOUNTER — Encounter (HOSPITAL_COMMUNITY)
Admission: EM | Disposition: A | Discharge status: Home / Self Care | Payer: Self-pay | Source: Home / Self Care | Attending: Internal Medicine

## 2021-01-14 DIAGNOSIS — E042 Nontoxic multinodular goiter: Secondary | ICD-10-CM | POA: Diagnosis present

## 2021-01-14 DIAGNOSIS — K760 Fatty (change of) liver, not elsewhere classified: Secondary | ICD-10-CM | POA: Diagnosis present

## 2021-01-14 DIAGNOSIS — J849 Interstitial pulmonary disease, unspecified: Secondary | ICD-10-CM | POA: Diagnosis present

## 2021-01-14 DIAGNOSIS — N182 Chronic kidney disease, stage 2 (mild): Secondary | ICD-10-CM | POA: Diagnosis present

## 2021-01-14 DIAGNOSIS — Z7902 Long term (current) use of antithrombotics/antiplatelets: Secondary | ICD-10-CM | POA: Diagnosis not present

## 2021-01-14 DIAGNOSIS — Z79899 Other long term (current) drug therapy: Secondary | ICD-10-CM | POA: Diagnosis not present

## 2021-01-14 DIAGNOSIS — Z8249 Family history of ischemic heart disease and other diseases of the circulatory system: Secondary | ICD-10-CM | POA: Diagnosis not present

## 2021-01-14 DIAGNOSIS — Z83438 Family history of other disorder of lipoprotein metabolism and other lipidemia: Secondary | ICD-10-CM | POA: Diagnosis not present

## 2021-01-14 DIAGNOSIS — F411 Generalized anxiety disorder: Secondary | ICD-10-CM | POA: Diagnosis present

## 2021-01-14 DIAGNOSIS — G4733 Obstructive sleep apnea (adult) (pediatric): Secondary | ICD-10-CM | POA: Diagnosis present

## 2021-01-14 DIAGNOSIS — K219 Gastro-esophageal reflux disease without esophagitis: Secondary | ICD-10-CM | POA: Diagnosis present

## 2021-01-14 DIAGNOSIS — U071 COVID-19: Secondary | ICD-10-CM | POA: Diagnosis present

## 2021-01-14 DIAGNOSIS — Z833 Family history of diabetes mellitus: Secondary | ICD-10-CM | POA: Diagnosis not present

## 2021-01-14 DIAGNOSIS — R0602 Shortness of breath: Secondary | ICD-10-CM | POA: Diagnosis not present

## 2021-01-14 DIAGNOSIS — Z6836 Body mass index (BMI) 36.0-36.9, adult: Secondary | ICD-10-CM | POA: Diagnosis not present

## 2021-01-14 DIAGNOSIS — Z823 Family history of stroke: Secondary | ICD-10-CM | POA: Diagnosis not present

## 2021-01-14 DIAGNOSIS — E782 Mixed hyperlipidemia: Secondary | ICD-10-CM | POA: Diagnosis present

## 2021-01-14 DIAGNOSIS — Z720 Tobacco use: Secondary | ICD-10-CM | POA: Diagnosis not present

## 2021-01-14 DIAGNOSIS — D6861 Antiphospholipid syndrome: Secondary | ICD-10-CM | POA: Diagnosis present

## 2021-01-14 DIAGNOSIS — I129 Hypertensive chronic kidney disease with stage 1 through stage 4 chronic kidney disease, or unspecified chronic kidney disease: Secondary | ICD-10-CM | POA: Diagnosis present

## 2021-01-14 DIAGNOSIS — Z888 Allergy status to other drugs, medicaments and biological substances status: Secondary | ICD-10-CM | POA: Diagnosis not present

## 2021-01-14 DIAGNOSIS — J9601 Acute respiratory failure with hypoxia: Secondary | ICD-10-CM | POA: Diagnosis present

## 2021-01-14 DIAGNOSIS — Z86711 Personal history of pulmonary embolism: Secondary | ICD-10-CM | POA: Diagnosis not present

## 2021-01-14 DIAGNOSIS — J1282 Pneumonia due to coronavirus disease 2019: Secondary | ICD-10-CM | POA: Diagnosis present

## 2021-01-14 HISTORY — PX: VIDEO BRONCHOSCOPY: SHX5072

## 2021-01-14 HISTORY — PX: BRONCHIAL WASHINGS: SHX5105

## 2021-01-14 LAB — BASIC METABOLIC PANEL
Anion gap: 10 (ref 5–15)
BUN: 16 mg/dL (ref 6–20)
CO2: 27 mmol/L (ref 22–32)
Calcium: 9.2 mg/dL (ref 8.9–10.3)
Chloride: 101 mmol/L (ref 98–111)
Creatinine, Ser: 1.1 mg/dL (ref 0.61–1.24)
GFR, Estimated: >60 mL/min (ref >60)
Glucose, Bld: 99 mg/dL (ref 70–99)
Potassium: 3.5 mmol/L (ref 3.5–5.1)
Sodium: 138 mmol/L (ref 135–145)

## 2021-01-14 LAB — CBC
HCT: 42.6 % (ref 39.0–52.0)
Hemoglobin: 14.3 g/dL (ref 13.0–17.0)
MCH: 30.4 pg (ref 26.0–34.0)
MCHC: 33.6 g/dL (ref 30.0–36.0)
MCV: 90.4 fL (ref 80.0–100.0)
Platelets: 216 10*3/uL (ref 150–400)
RBC: 4.71 MIL/uL (ref 4.22–5.81)
RDW: 12.4 % (ref 11.5–15.5)
WBC: 6.9 10*3/uL (ref 4.0–10.5)
nRBC: 0 % (ref 0.0–0.2)

## 2021-01-14 LAB — ENA+DNA/DS+ANTICH+CENTRO+JO...
Anti JO-1: <0.2 AI (ref 0.0–0.9)
Centromere Ab Screen: <0.2 AI (ref 0.0–0.9)
Chromatin Ab SerPl-aCnc: 0.5 AI (ref 0.0–0.9)
ENA SM Ab Ser-aCnc: <0.2 AI (ref 0.0–0.9)
Ribonucleic Protein: 2.4 AI — ABNORMAL HIGH (ref 0.0–0.9)
SSA (Ro) (ENA) Antibody, IgG: 0.7 AI (ref 0.0–0.9)
SSB (La) (ENA) Antibody, IgG: <0.2 AI (ref 0.0–0.9)
Scleroderma (Scl-70) (ENA) Antibody, IgG: <0.2 AI (ref 0.0–0.9)
ds DNA Ab: 177 IU/mL — ABNORMAL HIGH (ref 0–9)

## 2021-01-14 LAB — ANA W/REFLEX IF POSITIVE: Anti Nuclear Antibody (ANA): POSITIVE — AB

## 2021-01-14 SURGERY — VIDEO BRONCHOSCOPY WITHOUT FLUORO
Anesthesia: General

## 2021-01-14 MED ORDER — PROPOFOL 10 MG/ML IV BOLUS
INTRAVENOUS | Status: DC | PRN
  Administered 2021-01-14: 200 mg via INTRAVENOUS

## 2021-01-14 MED ORDER — FENTANYL CITRATE (PF) 100 MCG/2ML IJ SOLN
INTRAMUSCULAR | Status: DC | PRN
  Administered 2021-01-14: 100 ug via INTRAVENOUS

## 2021-01-14 MED ORDER — IRBESARTAN 300 MG PO TABS
300.0000 mg | ORAL_TABLET | Freq: Every day | ORAL | Status: DC
  Administered 2021-01-14 – 2021-01-16 (×3): 300 mg via ORAL
  Filled 2021-01-14 (×3): qty 1

## 2021-01-14 MED ORDER — HYDROCHLOROTHIAZIDE 25 MG PO TABS
25.0000 mg | ORAL_TABLET | Freq: Every day | ORAL | Status: DC
  Administered 2021-01-14 – 2021-01-16 (×3): 25 mg via ORAL
  Filled 2021-01-14 (×3): qty 1

## 2021-01-14 MED ORDER — DEXAMETHASONE SODIUM PHOSPHATE 10 MG/ML IJ SOLN
INTRAMUSCULAR | Status: DC | PRN
  Administered 2021-01-14: 10 mg via INTRAVENOUS

## 2021-01-14 MED ORDER — ROCURONIUM BROMIDE 10 MG/ML (PF) SYRINGE
PREFILLED_SYRINGE | INTRAVENOUS | Status: DC | PRN
  Administered 2021-01-14: 40 mg via INTRAVENOUS

## 2021-01-14 MED ORDER — LACTATED RINGERS IV SOLN: INTRAVENOUS | Status: DC | PRN

## 2021-01-14 MED ORDER — IPRATROPIUM-ALBUTEROL 0.5-2.5 (3) MG/3ML IN SOLN
3.0000 mL | Freq: Once | RESPIRATORY_TRACT | Status: AC
  Administered 2021-01-14: 3 mL via RESPIRATORY_TRACT

## 2021-01-14 MED ORDER — PHENYLEPHRINE 40 MCG/ML (10ML) SYRINGE FOR IV PUSH (FOR BLOOD PRESSURE SUPPORT)
PREFILLED_SYRINGE | INTRAVENOUS | Status: DC | PRN
  Administered 2021-01-14: 80 ug via INTRAVENOUS

## 2021-01-14 MED ORDER — MIDAZOLAM HCL 5 MG/5ML IJ SOLN
INTRAMUSCULAR | Status: DC | PRN
  Administered 2021-01-14: 2 mg via INTRAVENOUS

## 2021-01-14 MED ORDER — IPRATROPIUM-ALBUTEROL 0.5-2.5 (3) MG/3ML IN SOLN
RESPIRATORY_TRACT | Status: AC
  Filled 2021-01-14: qty 3

## 2021-01-14 MED ORDER — FENTANYL CITRATE (PF) 100 MCG/2ML IJ SOLN
INTRAMUSCULAR | Status: AC
  Filled 2021-01-14: qty 2

## 2021-01-14 MED ORDER — LIDOCAINE 2% (20 MG/ML) 5 ML SYRINGE
INTRAMUSCULAR | Status: DC | PRN
  Administered 2021-01-14: 60 mg via INTRAVENOUS

## 2021-01-14 MED ORDER — PROPOFOL 10 MG/ML IV BOLUS
INTRAVENOUS | Status: AC
  Filled 2021-01-14: qty 20

## 2021-01-14 MED ORDER — SUGAMMADEX SODIUM 200 MG/2ML IV SOLN
INTRAVENOUS | Status: DC | PRN
  Administered 2021-01-14: 200 mg via INTRAVENOUS

## 2021-01-14 MED ORDER — MIDAZOLAM HCL 2 MG/2ML IJ SOLN
INTRAMUSCULAR | Status: AC
  Filled 2021-01-14: qty 2

## 2021-01-14 MED ORDER — ONDANSETRON HCL 4 MG/2ML IJ SOLN
INTRAMUSCULAR | Status: DC | PRN
  Administered 2021-01-14: 4 mg via INTRAVENOUS

## 2021-01-14 NOTE — Progress Notes (Addendum)
This RN spoke with Dr. Roanna Banning about pt going back to the unit he came from on [facility] and Dess, RN for 1431 said they would put him on High Flow Nasal Cannula when he gets upstairs.

## 2021-01-14 NOTE — Anesthesia Procedure Notes (Signed)
Procedure Name: Intubation Date/Time: 01/14/2021 2:09 PM Performed by: Maxwell Caul, CRNA Pre-anesthesia Checklist: Patient identified, Emergency Drugs available, Suction available and Patient being monitored Patient Re-evaluated:Patient Re-evaluated prior to induction Oxygen Delivery Method: Circle system utilized Preoxygenation: Pre-oxygenation with 100% oxygen Induction Type: IV induction Ventilation: Mask ventilation without difficulty, Oral airway inserted - appropriate to patient size and Two handed mask ventilation required Laryngoscope Size: Mac and 4 Grade View: Grade I Tube type: Oral Tube size: 9.0 mm Number of attempts: 1 Airway Equipment and Method: Stylet and Oral airway Placement Confirmation: ETT inserted through vocal cords under direct vision,  positive ETCO2 and breath sounds checked- equal and bilateral Secured at: 22 cm Tube secured with: Tape Dental Injury: Teeth and Oropharynx as per pre-operative assessment

## 2021-01-14 NOTE — Progress Notes (Signed)
PROGRESS NOTE  Wesley Hancock  DOB: 02/05/1971  PCP: Inda Coke, [place] ZOX:[id number]  DOA: 01/13/2021  LOS: 0 days   Chief Complaint  Patient presents with  . Chest Pain  . Shortness of Breath   Brief narrative: Wesley Hancock is a 50 y.o. male, vaccinated against COVID-19 with PMH significant for antiphospholipid syndrome, DVT and PE currently on Lovenox, COVID-19 in December, hypertension, hyperlipidemia, multinodular goiter, nutcracker esophagus, CKD 2, OSA, obesity.  Patient presented to the ED on 2/14 with chest pain and exertional shortness of breath for 5 days.  He was concerned with the chance of recurrent pulmonary embolism and hence presented to ED.   He believes he has had mold exposure in his house where he and his wife moved to roughly 2 years ago, prior to the onset of symptoms.  Over the course of 2 years, he has had multiple CT scans which showed concerns of multifocal pneumonia that was treated but he never fully recovered.  In the ED, patient was afebrile, blood pressure elevated to 150s, oxygen saturation in 90s on room air. Lab with creatinine elevated 1.33, WBC count normal COVID-19 PCR positive. CT angio chest showed worsening bilateral extensive airspace opacity, suggestive of atypical pneumonia, stable reactive adenopathy.  It also showed hepatic steatosis and thyroid nodules  Patient was admitted to hospitalist service. Pulmonary consultation was obtained.  Subjective: Patient was seen and examined this morning, not in distress.  On low-flow oxygen. waiting for bronchoscopy. Chart reviewed.  No fever, hemodynamically stable Labs remain unremarkable.  Assessment/Plan: Recurrent patchy pulmonary infiltrates -Presented with shortness of breath, chest pain.  Recurrent similar symptoms over the course of 2 years with persistent pulmonary infiltrates.  CT scan on this admission with worsening infiltrates. -With a known history of antiphospholipid  syndrome, need to consider noninfectious/autoimmune mediated process. -Pulmonary consult appreciated.  Noted a plan for bronchoscopy today -Send for ANCA titer, sed rate, CRP, HSP panel.  Multinodular goiter Noted on recent thyroid US TSH unremarkable, plans to schedule outpatient FNA Recent Labs    01/10/21 1514  TSH 0.91   History of DVT and PE in the setting of antiphospholipid syndrome Continue full dose Lovenox  Hypertension Continue home telmisartan-HCTZ  Hyperlipidemia Continue statin  Mobility: Encourage ambulation Code Status:   Code Status: Full Code  Nutritional status: Body mass index is 36.49 kg/m.     Diet Order            Diet NPO time specified  Diet effective now                 DVT prophylaxis: Lovenox subcu   Antimicrobials:  None Fluid: None Consultants: Pulmonology Family Communication:  None at bedside  Status is: Observation  The patient will require care spanning > 2 midnights and should be moved to inpatient because: Pending bronchoscopy, oxygen dependent  Dispo: The patient is from: Home              Anticipated d/c is to: Home              Anticipated d/c date is: 2 days              Patient currently is not medically stable to d/c.   Difficult to place patient No       Infusions:    Scheduled Meds: . buPROPion  150 mg Oral Daily  . enoxaparin  120 mg Subcutaneous Q12H  . escitalopram  20 mg Oral Daily  . hydrochlorothiazide  25 mg Oral Daily   And  . irbesartan  300 mg Oral Daily  . pantoprazole  40 mg Oral BID  . sodium chloride flush  3 mL Intravenous Q12H    Antimicrobials: Anti-infectives (From admission, onward)   None      PRN meds: acetaminophen **OR** acetaminophen, metoprolol tartrate, polyethylene glycol   Objective: Vitals:   01/14/21 0158 01/14/21 0450  BP: (!) 152/97 133/77  Pulse: 87 88  Resp: 20 (!) 23  Temp: 98.3 F (36.8 C) 98.1 F (36.7 C)  SpO2: 92% 96%    Intake/Output  Summary (Last 24 hours) at 01/14/2021 0828 Last data filed at 01/13/2021 1149 Gross per 24 hour  Intake 1000 ml  Output -  Net 1000 ml   Filed Weights   01/14/21 0158  Weight: 128.9 kg   Weight change:  Body mass index is 36.49 kg/m.   Physical Exam: General exam: obese, pleasant middle-aged Caucasian male.  On low-flow oxygen.  Not in distress Skin: No rashes, lesions or ulcers. HEENT: Atraumatic, normocephalic, no obvious bleeding Lungs: Mild scattered bilateral fine crackles CVS: Regular rate and rhythm, no murmur GI/Abd soft, nontender, nondistended, bowel sound present CNS: Alert, awake, oriented x3 Psychiatry: Mood appropriate Extremities: No pedal edema, no calf tenderness  Data Review: I have personally reviewed the laboratory data and studies available.  Recent Labs  Lab 01/13/21 0944 01/14/21 0405  WBC 10.1 6.9  HGB 14.6 14.3  HCT 42.7 42.6  MCV 90.1 90.4  PLT 212 216   Recent Labs  Lab 01/13/21 0944 01/14/21 0405  NA 140 138  K 4.4 3.5  CL 101 101  CO2 31 27  GLUCOSE 95 99  BUN 21* 16  CREATININE 1.33* 1.10  CALCIUM 9.6 9.2    F/u labs ordered Unresulted Labs (From admission, onward)          Start     Ordered   01/14/21 0272  Basic metabolic panel  Daily,   R      01/13/21 1220   01/14/21 0500  CBC  Daily,   R      01/13/21 1220   01/13/21 1235  ANA w/Reflex if Positive  Once,   STAT        01/13/21 1235   01/13/21 1235  Mpo/pr-3 (anca) antibodies  Once,   STAT        01/13/21 1235   01/13/21 1150  ANCA Titers  (Anti-Neutrophilic Cystoplasmic Antibody Panel (PNL))  Once,   STAT        01/13/21 1151          Signed, Terrilee Croak, MD [facility] 01/14/2021

## 2021-01-14 NOTE — Transfer of Care (Signed)
Immediate Anesthesia Transfer of Care Note  Patient: Wesley Hancock  Procedure(s) Performed: VIDEO BRONCHOSCOPY WITHOUT FLUORO (N/A ) BRONCHIAL WASHINGS  Patient Location: ENDO 4 ROOM  Anesthesia Type:General  Level of Consciousness: awake, alert  and oriented  Airway & Oxygen Therapy: Patient Spontanous Breathing and Patient connected to face mask oxygen  Post-op Assessment: Report given to RN and Post -op Vital signs reviewed and stable  Post vital signs: Reviewed and stable  Last Vitals:  Vitals Value Taken Time  BP    Temp    Pulse    Resp    SpO2      Last Pain:  Vitals:   01/14/21 1345  TempSrc: Oral  PainSc:          Complications: No complications documented.

## 2021-01-14 NOTE — Anesthesia Preprocedure Evaluation (Signed)
Anesthesia Evaluation  Patient identified by MRN, date of birth, ID band Patient awake    Reviewed: Allergy & Precautions, NPO status , Patient's Chart, lab work & pertinent test results  Airway Mallampati: II  TM Distance: >3 FB Neck ROM: Full    Dental no notable dental hx.    Pulmonary sleep apnea , former smoker, PE   Pulmonary exam normal breath sounds clear to auscultation       Cardiovascular hypertension, Pt. on medications + DVT  Normal cardiovascular exam Rhythm:Regular Rate:Normal     Neuro/Psych  Headaches, PSYCHIATRIC DISORDERS Anxiety Depression    GI/Hepatic negative GI ROS, Neg liver ROS,   Endo/Other  negative endocrine ROS  Renal/GU negative Renal ROS     Musculoskeletal  (+) Arthritis ,   Abdominal (+) + obese,   Peds  Hematology negative hematology ROS (+)   Anesthesia Other Findings shortness of breath  Reproductive/Obstetrics                             Anesthesia Physical Anesthesia Plan  ASA: II  Anesthesia Plan: General   Post-op Pain Management:    Induction: Intravenous  PONV Risk Score and Plan: 2 and Ondansetron, Dexamethasone, Midazolam and Treatment may vary due to age or medical condition  Airway Management Planned: Oral ETT  Additional Equipment:   Intra-op Plan:   Post-operative Plan: Extubation in OR  Informed Consent: I have reviewed the patients History and Physical, chart, labs and discussed the procedure including the risks, benefits and alternatives for the proposed anesthesia with the patient or authorized representative who has indicated his/her understanding and acceptance.     Dental advisory given  Plan Discussed with: CRNA  Anesthesia Plan Comments:         Anesthesia Quick Evaluation

## 2021-01-14 NOTE — Op Note (Signed)
Bronchoscopy Procedure Note  Wesley Hancock  [id number]  11/12/1971  Date:01/14/21  Time:2:42 PM   Provider Performing:Yossef Gilkison B Alphonzo Devera   Procedure(s):  Flexible bronchoscopy with bronchial alveolar lavage (01779)  Indication(s) Shortness of breath, Concern for ILD  Consent Risks of the procedure as well as the alternatives and risks of each were explained to the patient and/or caregiver.  Consent for the procedure was obtained and is signed in the bedside chart  Anesthesia General  Time Out Verified patient identification, verified procedure, site/side was marked, verified correct patient position, special equipment/implants available, medications/allergies/relevant history reviewed, required imaging and test results available.   Sterile Technique Usual hand hygiene, masks, gowns, and gloves were used   Procedure Description Bronchoscope advanced through endotracheal tube and into airway.  Airways were examined down to subsegmental level with findings noted below.   Following diagnostic evaluation, BAL(s) performed in RUL and LUL with normal saline and return of 73m and 390mof fluid respectively.  Findings:  Normal appearing trachea with some blood tinged clear secretions at the main carina.  Some areas of erythema noted in the right upper lobe and left upper lobe. No bleeding noted throughout the airways.  Return on the LUL BAL was blood tinged and a second BAL performed at the LUL which was less blood tinged.    Complications/Tolerance None; patient tolerated the procedure well. Chest X-ray is not needed post procedure.   EBL Minimal   Specimen(s) RUL - cytology, cell count, respiratory culture, AFB culture and fungal culture LUL - cytology, cell count, respiratory culture, AFB culture and fungal culture

## 2021-01-15 ENCOUNTER — Encounter (HOSPITAL_COMMUNITY): Payer: Self-pay | Admitting: Pulmonary Disease

## 2021-01-15 DIAGNOSIS — R0602 Shortness of breath: Secondary | ICD-10-CM | POA: Diagnosis not present

## 2021-01-15 LAB — URINALYSIS, ROUTINE W REFLEX MICROSCOPIC
Bilirubin Urine: NEGATIVE
Glucose, UA: 50 mg/dL — AB
Hgb urine dipstick: NEGATIVE
Ketones, ur: NEGATIVE mg/dL
Leukocytes,Ua: NEGATIVE
Nitrite: NEGATIVE
Protein, ur: NEGATIVE mg/dL
Specific Gravity, Urine: 1.017 (ref 1.005–1.030)
pH: 7 (ref 5.0–8.0)

## 2021-01-15 LAB — PNEUMOCYSTIS JIROVECI SMEAR BY DFA
Pneumocystis jiroveci Ag: NEGATIVE
Pneumocystis jiroveci Ag: NEGATIVE

## 2021-01-15 LAB — CBC
HCT: 41 % (ref 39.0–52.0)
Hemoglobin: 13.8 g/dL (ref 13.0–17.0)
MCH: 30.6 pg (ref 26.0–34.0)
MCHC: 33.7 g/dL (ref 30.0–36.0)
MCV: 90.9 fL (ref 80.0–100.0)
Platelets: 229 10*3/uL (ref 150–400)
RBC: 4.51 MIL/uL (ref 4.22–5.81)
RDW: 12.4 % (ref 11.5–15.5)
WBC: 8.3 10*3/uL (ref 4.0–10.5)
nRBC: 0 % (ref 0.0–0.2)

## 2021-01-15 LAB — BASIC METABOLIC PANEL
Anion gap: 9 (ref 5–15)
BUN: 21 mg/dL — ABNORMAL HIGH (ref 6–20)
CO2: 26 mmol/L (ref 22–32)
Calcium: 8.9 mg/dL (ref 8.9–10.3)
Chloride: 101 mmol/L (ref 98–111)
Creatinine, Ser: 1.2 mg/dL (ref 0.61–1.24)
GFR, Estimated: >60 mL/min (ref >60)
Glucose, Bld: 175 mg/dL — ABNORMAL HIGH (ref 70–99)
Potassium: 4.1 mmol/L (ref 3.5–5.1)
Sodium: 136 mmol/L (ref 135–145)

## 2021-01-15 LAB — BODY FLUID CELL COUNT WITH DIFFERENTIAL
Eos, Fluid: 1 %
Eos, Fluid: 2 %
Lymphs, Fluid: 10 %
Lymphs, Fluid: 10 %
Monocyte-Macrophage-Serous Fluid: 25 % — ABNORMAL LOW (ref 50–90)
Monocyte-Macrophage-Serous Fluid: 50 % (ref 50–90)
Neutrophil Count, Fluid: 39 % — ABNORMAL HIGH (ref 0–25)
Neutrophil Count, Fluid: 68 % — ABNORMAL HIGH (ref 0–25)
Total Nucleated Cell Count, Fluid: 1030 cu mm — ABNORMAL HIGH (ref 0–1000)
Total Nucleated Cell Count, Fluid: 430 cu mm (ref 0–1000)

## 2021-01-15 LAB — GLUCOSE, CAPILLARY
Glucose-Capillary: 164 mg/dL — ABNORMAL HIGH (ref 70–99)
Glucose-Capillary: 207 mg/dL — ABNORMAL HIGH (ref 70–99)
Glucose-Capillary: 129 mg/dL — ABNORMAL HIGH (ref 70–99)

## 2021-01-15 LAB — MPO/PR-3 (ANCA) ANTIBODIES
ANCA Proteinase 3: <3.5 U/mL (ref 0.0–3.5)
Myeloperoxidase Abs: <9 U/mL (ref 0.0–9.0)

## 2021-01-15 LAB — CYCLIC CITRUL PEPTIDE ANTIBODY, IGG/IGA: CCP Antibodies IgG/IgA: 10 units (ref 0–19)

## 2021-01-15 LAB — ANCA TITERS
Atypical P-ANCA titer: <1:20 {titer}
C-ANCA: <1:20 {titer}
P-ANCA: <1:20 {titer}

## 2021-01-15 MED ORDER — MENTHOL 3 MG MT LOZG: 1.0000 | LOZENGE | OROMUCOSAL | Status: DC | PRN

## 2021-01-15 MED ORDER — METHYLPREDNISOLONE SODIUM SUCC 125 MG IJ SOLR
60.0000 mg | Freq: Two times a day (BID) | INTRAMUSCULAR | Status: DC
  Administered 2021-01-15 – 2021-01-16 (×4): 60 mg via INTRAVENOUS
  Filled 2021-01-15 (×4): qty 2

## 2021-01-15 MED ORDER — MELATONIN 3 MG PO TABS
3.0000 mg | ORAL_TABLET | Freq: Every evening | ORAL | Status: DC | PRN
  Administered 2021-01-15: 3 mg via ORAL
  Filled 2021-01-15: qty 1

## 2021-01-15 MED ORDER — INSULIN ASPART 100 UNIT/ML ~~LOC~~ SOLN: 0.0000 [IU] | Freq: Every day | SUBCUTANEOUS | Status: DC

## 2021-01-15 MED ORDER — INSULIN ASPART 100 UNIT/ML ~~LOC~~ SOLN
0.0000 [IU] | Freq: Three times a day (TID) | SUBCUTANEOUS | Status: DC
  Administered 2021-01-15: 2 [IU] via SUBCUTANEOUS
  Administered 2021-01-15: 3 [IU] via SUBCUTANEOUS
  Administered 2021-01-16: 2 [IU] via SUBCUTANEOUS
  Administered 2021-01-16: 1 [IU] via SUBCUTANEOUS
  Administered 2021-01-16: 2 [IU] via SUBCUTANEOUS

## 2021-01-15 NOTE — Consult Note (Signed)
NAME:  Wesley Hancock, MRN:  [id number], DOB:  May 11, 1971, LOS: 1 ADMISSION DATE:  01/13/2021, CONSULTATION DATE:  2/14 REFERRING MD:  Neysa Bonito, CHIEF COMPLAINT:  Recurrent PNA     History of Present Illness:  50 year old male with history of DVT/PE due to antiphospholipid syndrome diagnosed in September 2020 who presented to the ER 2/14 w/ cc: chest pain and worsening shortness of breath over 5d period. Describes the CP as center of chest, some radiation to left scapula and arm but much worse pain w/ deep breath. No cough no sick exposure, No HA, sore throat, nasal dc. No wheeze. No LE swelling.   In the ER he had CT scan as presentation was similar to prior presentation when he had PE. He was negative for PE but had diffuse bilateral pulmonary infiltrates which have been recurrent in nature and were the reason for our consult.  Upon review of chest imaging he has had bilateral ground glass infiltrates that follow a bronchovascular pattern for the most part with peripheral areas of involvement possibly involving the pleura. He had CT Chests on 11/26/20, 06/26/20, 05/30/20 and 08/30/2019 all with bilateral scattered groundglass infiltrates. He has a chest radiograph dating back to 2017 with the first signs of pulmonary opacities.  The pulmonary infiltrates were initially thought to be signs of aspiration as he carries the diagnosis of nutcracker esophagus based on esophageal manometry testing in the past.    His quality of life has declined over the past years as he has not been able to be very active due to his shortness of breath.   PFTs 03/08/20 show mild restrictive defect and mild diffusion defect.   Bronchoscopy was performed on 01/14/21 with signs of blood tinged sputum but no over abnormalities of his airways. BALs were performed in the right and left upper lobes. There was blood tinged return from the LUL but on a second BAL it was less bloody appearing. BAL studies are pending at this time.    Inflammatory workup shows positive ANA with elevated dsDNA and RNP. ESR and CRP are elevated.   Infectious workup does show he is positive for Covid 19, but is asymptomatic at this time. Extended respiratory viral panel is negative.     Past Medical History:  Antiphospholipid syndrome, prior PE and DVT on chronic LMWH, recurrent PNA, HTN, HL, multinodular goiter, nutcracker esophagus, CKD stage II, OSA obesity   Significant Hospital Events:  2/14 admitted  Consults:  Pulmonary consult   Procedures:    Significant Diagnostic Tests:  CT angiogram 2/14: 1. No demonstrable pulmonary embolus. No thoracic aortic aneurysm or dissection. There are foci of aortic atherosclerosis as well as foci of coronary artery calcification.2. Extensive airspace opacity bilaterally with overall increased compared to December 2021. Areas of patchy consolidation noted in several areas. The overall appearance is most indicative of atypical organism pneumonia. A degree of bacterial superinfection is question. Note that there has been persistence of multifocal airspace opacity for several months. Micro Data:  COVID 2/14  Antimicrobials:    Interim History / Subjective:  No acute issues. His voice is a little raspy since the bronchoscopy yesterday. Otherwise feeling well.    Objective   Blood pressure 110/76, pulse 86, temperature 98.4 F (36.9 C), temperature source Oral, resp. rate 18, height _0  (1.88 m), weight 128.9 kg, SpO2 95 %.        Intake/Output Summary (Last 24 hours) at 01/15/2021 0949 Last data filed at 01/14/2021 1439 Gross per  24 hour  Intake 700 ml  Output --  Net 700 ml   Filed Weights   01/14/21 0158 01/14/21 1345  Weight: 128.9 kg 128.9 kg    Examination: General: no acute distress, sitting up eating breakfast HENT: voice slightly hoarse. [place]/AT. moise mucous membranes Lungs: scattered crackles posteriorly. No wheezing Cardiovascular: RRR, no murmurs Abdomen: soft,  not tender, non-distended Extremities: warm and dry  Neuro: awake and alert. 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  - We will send a myositis panel - We will start solumedrol 72m BID IV. If he has good response we will transition him to 644mprednisone PO in 3 days. He will need bactrim prophylaxis started as well.  - We will consider further immunosuppressive therapy with mycophenolate mofetil or methotrexate in order to taper off steroids - Will follow up bronchoscopy results   Other historical problems :  Esophageal spasms (H/o) Antiphospholipid syndrome H/o DVT and PE (on LMWH) H/o goiter  H/o OSA (awaiting CPAP) H/o HTN   Best practice (evaluated daily)  Per primary   Labs   CBC: Recent Labs  Lab 01/13/21 0944 01/14/21 0405 01/15/21 0427  WBC 10.1 6.9 8.3  HGB 14.6 14.3 13.8  HCT 42.7 42.6 41.0  MCV 90.1 90.4 90.9  PLT 212 216 22465  Basic Metabolic Panel: Recent Labs  Lab 01/13/21 0944 01/14/21 0405 01/15/21 0427  NA 140 138 136  K 4.4 3.5 4.1  CL 101 101 101  CO2 _0 GLUCOSE 95 99 175*  BUN 21* 16 21*  CREATININE 1.33* 1.10 1.20  CALCIUM 9.6 9.2 8.9   GFR: Estimated Creatinine Clearance: 107.4 mL/min (by C-G formula based on SCr of 1.2 mg/dL). Recent Labs  Lab 01/13/21 0944 01/14/21 0405 01/15/21 0427  WBC 10.1 6.9 8.3    Liver Function Tests: Recent Labs  Lab 01/13/21 0944  AST 31  ALT 61*  ALKPHOS 46  BILITOT 0.8  PROT 7.6  ALBUMIN 3.8   Recent Labs  Lab 01/13/21 0944  LIPASE 42   No results for input(s): AMMONIA in the last 168 hours.  ABG    Component Value Date/Time   TCO2 26 08/30/2019 1329     Coagulation Profile: No results for input(s): INR, PROTIME in the last 168 hours.  Cardiac Enzymes: No results for  input(s): CKTOTAL, CKMB, CKMBINDEX, TROPONINI in the last 168 hours.  HbA1C: No results found for: HGBA1C  CBG: No results for input(s): GLUCAP in the last 168 hours.  Review of Systems:   Review of Systems  Constitutional: Negative for diaphoresis, fever and malaise/fatigue.  HENT: Negative for congestion, sinus pain and sore throat.   Eyes: Negative.   Respiratory: Positive for shortness of breath. Negative for cough, hemoptysis, wheezing and stridor.   Cardiovascular: Positive for chest pain. Negative for leg swelling.  Gastrointestinal: Positive for abdominal pain and heartburn.  Genitourinary: Negative.   Musculoskeletal: Negative.   Skin: Negative.   Neurological: Negative.   Endo/Heme/Allergies: Negative.   Psychiatric/Behavioral: Negative.      Past Medical History:  He,  has a past medical history of Acute nonintractable headache (09/06/2019), Acute respiratory failure with hypoxia ([facility](08/30/2019), AKI (acute kidney injury) ([facility](08/30/2019), Antiphospholipid syndrome ([facility] [place](10/30/2019), Anxiety, APS (antiphospholipid syndrome) ([facility] Ar[facility],  Colon polyp, Depression, Diarrhea (05/21/2020), DVT (deep venous thrombosis) ([place]), Dyspnea on exertion (09/08/2017), Esophageal spasm, Essential hypertension (09/23/2014), GAD (generalized anxiety disorder) (08/06/2016), Lobar pneumonia ([place]) (08/30/2019), Lung nodule (09/21/2014), Mixed hyperlipidemia (09/18/2015), Morbid obesity due to excess calories ([name]) c/b hbp/ dvt/PE (09/10/2019), Multiple pulmonary emboli ([place]) (08/22/2019), Nutcracker esophagus, Paresthesia of right foot (11/08/2019), Pulmonary infiltrates (09/10/2019), Recurrent pneumonia (07/09/2020), Snoring (07/09/2020), Upper airway cough syndrome (01/05/2020), and Witnessed apneic spells (07/09/2020).   Surgical History:   Past Surgical History:  Procedure Laterality Date  . CHOLECYSTECTOMY       Social History:   reports that he quit smoking about 7 years ago. His smoking  use included cigarettes. He has a 20.00 pack-year smoking history. He quit smokeless tobacco use about 2 years ago.  His smokeless tobacco use included snuff. He reports current alcohol use of about 5.0 standard drinks of alcohol per week. He reports that he does not use drugs.   Family History:  His family history includes Colon cancer in his maternal grandmother; Colon polyps in his father; Diabetes in his maternal grandmother, mother, and sister; Diverticulitis in his father; Heart disease in his maternal grandfather, maternal grandmother, and paternal grandmother; Heart disease (age of onset: 62) in his father; Hyperlipidemia in his father, maternal grandfather, maternal grandmother, mother, and sister; Hypertension in his father, maternal grandfather, maternal grandmother, mother, and sister; Irritable bowel syndrome in his father and maternal grandfather; Liver cancer in his maternal grandfather; Lung cancer (age of onset: 63) in his father; Prostate cancer (age of onset: 40) in his father; Stroke in his maternal grandfather; Thyroid disease in his mother.   Allergies Allergies  Allergen Reactions  . Diltiazem Hcl Diarrhea and Other (See Comments)    Lethargic      Home Medications  Prior to Admission medications   Medication Sig Start Date End Date Taking? Authorizing Provider  acetaminophen (TYLENOL) 500 MG tablet Take 1,000 mg by mouth every 6 (six) hours as needed for mild pain.   Yes [provider]  buPROPion (WELLBUTRIN XL) 150 MG 24 hr tablet Take 1 tablet daily 11/25/20  Yes Vivi Barrack, MD  enoxaparin (LOVENOX) 120 MG/0.8ML injection Inject 0.8 mLs (120 mg total) into the skin every 12 (twelve) hours. 09/05/20  Yes [place], Holli Humbles, NP  escitalopram (LEXAPRO) 20 MG tablet TAKE 1 TABLET BY MOUTH EVERY DAY Patient taking differently: Take 20 mg by mouth daily. 12/30/20  Yes Inda Coke, PA  ezetimibe (ZETIA) 10 MG tablet Take 1 tablet (10 mg total) by mouth daily.  07/15/20 10/21/20 Yes Richardo Priest, MD  fluticasone (FLONASE) 50 MCG/ACT nasal spray Place 1 spray into both nostrils daily as needed for allergies.   Yes [provider]  ibuprofen (ADVIL) 200 MG tablet Take 400 mg by mouth every 6 (six) hours as needed for fever, headache or mild pain.   Yes [provider]  nitroGLYCERIN (NITROSTAT) 0.4 MG SL tablet Place 1 tablet (0.4 mg total) under the tongue every 5 (five) minutes as needed for chest pain. 03/07/18  Yes Inda Coke, PA  pantoprazole (PROTONIX) 40 MG tablet Take 1 tablet (40 mg total) by mouth 2 (two) times daily. 03/08/20  Yes [place], Tammy S, NP  Probiotic Product (PROBIOTIC PO) Take 1 capsule by mouth daily.   Yes [provider]  rosuvastatin (CRESTOR) 20 MG tablet Take 1 tablet (20 mg total) by mouth daily. 07/15/20 10/13/20 Yes Richardo Priest, MD  telmisartan-hydrochlorothiazide (MICARDIS HCT) 80-25 MG tablet TAKE  1 TABLET BY MOUTH EVERY DAY Patient taking differently: Take 1 tablet by mouth daily. 11/12/20  Yes Inda Coke, PA  azithromycin (ZITHROMAX) 250 MG tablet Take 2 tabs day 1, then 1 tab daily Patient not taking: Reported on 01/13/2021 11/25/20   Vivi Barrack, MD  ketoconazole (NIZORAL) 2 % cream APPLY TO AFFECTED AREA 1-2 TIMES DAILY Patient not taking: Reported on 01/13/2021 12/30/20   Inda Coke, PA     Critical care time: NA   Freda Jackson, MD [facility] Office: [phone]   See Amion for Pager Details

## 2021-01-15 NOTE — Progress Notes (Signed)
PROGRESS NOTE  Wesley Hancock  DOB: [birth year]  PCP: Inda Coke, [place] ZPH:[id number]  DOA: 01/13/2021  LOS: 1 day   Chief Complaint  Patient presents with  . Chest Pain  . Shortness of Breath   Brief narrative: Wesley Hancock is a 50 y.o. male, vaccinated against COVID-19 with PMH significant for antiphospholipid syndrome, DVT and PE currently on Lovenox, COVID-19 in December, hypertension, hyperlipidemia, multinodular goiter, nutcracker esophagus, CKD 2, OSA, obesity.  Patient presented to the ED on 2/14 with chest pain and exertional shortness of breath for 5 days.  He was concerned with the chance of recurrent pulmonary embolism and hence presented to ED.   He believes he has had mold exposure in his house where he and his wife moved to roughly 2 years ago, prior to the onset of symptoms.  Over the course of 2 years, he has had multiple CT scans which showed concerns of multifocal pneumonia that was treated but he never fully recovered.  In the ED, patient was afebrile, blood pressure elevated to 150s, oxygen saturation in 90s on room air. Lab with creatinine elevated 1.33, WBC count normal COVID-19 PCR positive. CT angio chest showed worsening bilateral extensive airspace opacity, suggestive of atypical pneumonia, stable reactive adenopathy.  It also showed hepatic steatosis and thyroid nodules  Patient was admitted to hospitalist service. Pulmonary consultation was obtained. 2/15, patient underwent bronchoscopy.  Subjective: Patient was seen and examined this morning, not in distress.   On 5 L oxygen by nasal cannula.    Assessment/Plan: Mixed connective tissue and lupus related interstitial lung disease -Presented with shortness of breath, chest pain.  Recurrent similar symptoms over the course of 2 years with persistent pulmonary infiltrates.  CT scan on this admission with worsening infiltrates. -With a known history of antiphospholipid syndrome, need to  consider noninfectious/autoimmune mediated process. -Pulmonary consult appreciated.   -2/15, patient underwent bronchoscopy and lavage.  Pending pathology. -Inflammatory work-up showed positive ANA with elevated dsDNA, RNP, CRP and ESR -Per pulmonary recommendation, patient is currently on Solu-Medrol 60 mg IV twice daily for 3 days after which she will be transitioned to oral prednisone for discharge to home  Acute respiratory failure with hypoxia -Currently requiring 5 L oxygen by nasal cannula.  Wean down as tolerated.  Multinodular goiter Noted on recent thyroid US TSH unremarkable, plans to schedule outpatient FNA Recent Labs    01/10/21 1514  TSH 0.91   History of DVT and PE in the setting of antiphospholipid syndrome Continue full dose Lovenox  Hypertension Continue home telmisartan-HCTZ  Hyperlipidemia Continue statin  Obesity - Body mass index is 36.49 kg/m. Patient has been advised to make an attempt to improve diet and exercise patterns to aid in weight loss.  Possible OSA -Recommend outpatient sleep study.  Mobility: Encourage ambulation Code Status:   Code Status: Full Code  Nutritional status: Body mass index is 36.49 kg/m.     Diet Order            Diet regular Room service appropriate? Yes; Fluid consistency: Thin  Diet effective now                 DVT prophylaxis: Lovenox subcu   Antimicrobials:  None Fluid: None Consultants: Pulmonology Family Communication:  None at bedside  Status is: Observation  The patient will require care spanning > 2 midnights and should be moved to inpatient because: Needs IV steroids.  oxygen dependent  Dispo: The patient is from: Home  Anticipated d/c is to: Home              Anticipated d/c date is: 2-3 days              Patient currently is not medically stable to d/c.   Difficult to place patient No       Infusions:    Scheduled Meds: . buPROPion  150 mg Oral Daily  . enoxaparin   120 mg Subcutaneous Q12H  . escitalopram  20 mg Oral Daily  . hydrochlorothiazide  25 mg Oral Daily   And  . irbesartan  300 mg Oral Daily  . insulin aspart  0-5 Units Subcutaneous QHS  . insulin aspart  0-9 Units Subcutaneous TID WC  . methylPREDNISolone (SOLU-MEDROL) injection  60 mg Intravenous Q12H  . pantoprazole  40 mg Oral BID  . sodium chloride flush  3 mL Intravenous Q12H    Antimicrobials: Anti-infectives (From admission, onward)   None      PRN meds: acetaminophen **OR** acetaminophen, metoprolol tartrate, polyethylene glycol   Objective: Vitals:   01/15/21 0441 01/15/21 1203  BP: 110/76 123/79  Pulse: 86 80  Resp: 18 20  Temp: 98.4 F (36.9 C) 98.2 F (36.8 C)  SpO2: 95% 100%    Intake/Output Summary (Last 24 hours) at 01/15/2021 1445 Last data filed at 01/15/2021 1349 Gross per 24 hour  Intake 240 ml  Output -  Net 240 ml   Filed Weights   01/14/21 0158 01/14/21 1345  Weight: 128.9 kg 128.9 kg   Weight change: 0 kg Body mass index is 36.49 kg/m.   Physical Exam: General exam: obese, pleasant middle-aged Caucasian male.  Obese.  On supplemental oxygen Skin: No rashes, lesions or ulcers. HEENT: Atraumatic, normocephalic, no obvious bleeding Lungs: Continues to have mild scattered bilateral fine crackles CVS: Regular rate and rhythm, no murmur GI/Abd soft, nontender, nondistended, bowel sound present CNS: Alert, awake, oriented x3 Psychiatry: Mood appropriate Extremities: No pedal edema, no calf tenderness  Data Review: I have personally reviewed the laboratory data and studies available.  Recent Labs  Lab 01/13/21 0944 01/14/21 0405 01/15/21 0427  WBC 10.1 6.9 8.3  HGB 14.6 14.3 13.8  HCT 42.7 42.6 41.0  MCV 90.1 90.4 90.9  PLT 212 216 229   Recent Labs  Lab 01/13/21 0944 01/14/21 0405 01/15/21 0427  NA 140 138 136  K 4.4 3.5 4.1  CL 101 101 101  CO2 '31 27 26  ' GLUCOSE 95 99 175*  BUN 21* 16 21*  CREATININE 1.33* 1.10 1.20   CALCIUM 9.6 9.2 8.9    F/u labs ordered Unresulted Labs (From admission, onward)          Start     Ordered   01/15/21 0835  Urinalysis, Routine w reflex microscopic  Once,   R        01/15/21 0834   01/15/21 0834  ANA  Once,   R       Question:  Specimen collection method  Answer:  Lab=Lab collect   01/15/21 0833   01/15/21 0831  Hemoglobin A1c  Once,   R       Question:  Specimen collection method  Answer:  Lab=Lab collect   01/15/21 0830   01/15/21 0732  Miscellaneous [facility] test (send-out)  Once,   R       Question:  Test name / description:  Answer:  MyoMarker - Myositis Panel   01/15/21 0732   01/15/21 0727  QuantiFERON-TB  Gold Plus  Once,   R       Question:  Specimen collection method  Answer:  Lab=Lab collect   01/15/21 0726   01/14/21 1421  Fungus Culture With Stain  RELEASE UPON ORDERING,   TIMED       Comments: Specimen B: Phone [phone]         Previous Biopsy:  no Is the patient on airborne/droplet precautions? No           Clinical History:  N/A Copy of Report to:  N/A Specimen Disposition: Cytology     01/14/21 1421   01/14/21 1421  Acid Fast Culture with reflexed sensitivities  RELEASE UPON ORDERING,   TIMED       Comments: Specimen B: Phone [phone]         Previous Biopsy:  no Is the patient on airborne/droplet precautions? No           Clinical History:  N/A Copy of Report to:  N/A Specimen Disposition: Cytology     01/14/21 1421   01/14/21 1421  Acid Fast Smear (AFB)  RELEASE UPON ORDERING,   TIMED       Comments: Specimen B: Phone [phone]         Previous Biopsy:  no Is the patient on airborne/droplet precautions? No           Clinical History:  N/A Copy of Report to:  N/A Specimen Disposition: Cytology     01/14/21 1421   01/14/21 1420  Pneumocystis smear by DFA  Once,   R        01/14/21 1420   01/14/21 1418  Fungus Culture With Stain  RELEASE UPON ORDERING,   TIMED       Comments: Specimen A: Phone [phone]         Previous  Biopsy:  no Is the patient on airborne/droplet precautions? No           Clinical History:  N/A Copy of Report to:  N/A Specimen Disposition: Cytology     01/14/21 1418   01/14/21 1418  Acid Fast Culture with reflexed sensitivities  RELEASE UPON ORDERING,   TIMED       Comments: Specimen A: Phone [phone]         Previous Biopsy:  no Is the patient on airborne/droplet precautions? No           Clinical History:  N/A Copy of Report to:  N/A Specimen Disposition: Cytology     01/14/21 1418   01/14/21 1418  Acid Fast Smear (AFB)  RELEASE UPON ORDERING,   TIMED       Comments: Specimen A: Phone [phone]         Previous Biopsy:  no Is the patient on airborne/droplet precautions? No           Clinical History:  N/A Copy of Report to:  N/A Specimen Disposition: Cytology     01/14/21 1418   01/14/21 1418  Pneumocystis smear by DFA  RELEASE UPON ORDERING,   TIMED       Comments: Specimen A: Phone [phone]         Previous Biopsy:  no Is the patient on airborne/droplet precautions? No           Clinical History:  N/A Copy of Report to:  N/A Specimen Disposition: Cytology     01/14/21 1418   83/15/17 6160  CYCLIC CITRUL PEPTIDE ANTIBODY, IGG/IGA  Once,   R  01/14/21 1146   01/14/21 4103  Basic metabolic panel  Daily,   R      01/13/21 1220   01/14/21 0500  CBC  Daily,   R      01/13/21 1220   01/13/21 1235  Mpo/pr-3 (anca) antibodies  Once,   STAT        01/13/21 1235          Signed, Terrilee Croak, MD [facility] 01/15/2021

## 2021-01-15 NOTE — Plan of Care (Signed)

## 2021-01-15 NOTE — Anesthesia Postprocedure Evaluation (Signed)
Anesthesia Post Note  Patient: Ottie Tillery  Procedure(s) Performed: VIDEO BRONCHOSCOPY WITHOUT FLUORO (N/A ) BRONCHIAL WASHINGS     Patient location during evaluation: Endoscopy Anesthesia Type: General Level of consciousness: awake Pain management: pain level controlled Vital Signs Assessment: post-procedure vital signs reviewed and stable Respiratory status: spontaneous breathing, nonlabored ventilation, respiratory function stable and non-rebreather facemask Cardiovascular status: blood pressure returned to baseline and stable Postop Assessment: no apparent nausea or vomiting Anesthetic complications: no Comments: Respiratory status improved with treatment in endoscopy suite 4   No complications documented.  Last Vitals:  Vitals:   01/14/21 2111 01/15/21 0441  BP: 109/74 110/76  Pulse: 91 86  Resp: 18 18  Temp: 37.1 C 36.9 C  SpO2: 93% 95%    Last Pain:  Vitals:   01/15/21 0441  TempSrc: Oral  PainSc:                  Dauna Ziska P Kaina Orengo

## 2021-01-15 NOTE — Progress Notes (Addendum)
Patient states that steroids keep him awake at night. He requested something to help him sleep and throat lozenges for his sore throat. Messaged MD. Jerene Pitch

## 2021-01-16 ENCOUNTER — Telehealth: Payer: Self-pay | Admitting: Pulmonary Disease

## 2021-01-16 DIAGNOSIS — R0602 Shortness of breath: Secondary | ICD-10-CM | POA: Diagnosis not present

## 2021-01-16 DIAGNOSIS — R7303 Prediabetes: Secondary | ICD-10-CM | POA: Diagnosis not present

## 2021-01-16 DIAGNOSIS — R739 Hyperglycemia, unspecified: Secondary | ICD-10-CM | POA: Diagnosis not present

## 2021-01-16 LAB — GLUCOSE, CAPILLARY
Glucose-Capillary: 129 mg/dL — ABNORMAL HIGH (ref 70–99)
Glucose-Capillary: 194 mg/dL — ABNORMAL HIGH (ref 70–99)
Glucose-Capillary: 197 mg/dL — ABNORMAL HIGH (ref 70–99)

## 2021-01-16 LAB — BASIC METABOLIC PANEL
Anion gap: 9 (ref 5–15)
BUN: 21 mg/dL — ABNORMAL HIGH (ref 6–20)
CO2: 27 mmol/L (ref 22–32)
Calcium: 9.6 mg/dL (ref 8.9–10.3)
Chloride: 102 mmol/L (ref 98–111)
Creatinine, Ser: 1.23 mg/dL (ref 0.61–1.24)
GFR, Estimated: >60 mL/min (ref >60)
Glucose, Bld: 166 mg/dL — ABNORMAL HIGH (ref 70–99)
Potassium: 4.5 mmol/L (ref 3.5–5.1)
Sodium: 138 mmol/L (ref 135–145)

## 2021-01-16 LAB — CBC
HCT: 44.1 % (ref 39.0–52.0)
Hemoglobin: 14.5 g/dL (ref 13.0–17.0)
MCH: 30.6 pg (ref 26.0–34.0)
MCHC: 32.9 g/dL (ref 30.0–36.0)
MCV: 93 fL (ref 80.0–100.0)
Platelets: 242 10*3/uL (ref 150–400)
RBC: 4.74 MIL/uL (ref 4.22–5.81)
RDW: 12.6 % (ref 11.5–15.5)
WBC: 11.9 10*3/uL — ABNORMAL HIGH (ref 4.0–10.5)
nRBC: 0 % (ref 0.0–0.2)

## 2021-01-16 LAB — HEMOGLOBIN A1C
Hgb A1c MFr Bld: 5.5 % (ref 4.8–5.6)
Mean Plasma Glucose: 111 mg/dL

## 2021-01-16 LAB — CYTOLOGY - NON PAP

## 2021-01-16 MED ORDER — SULFAMETHOXAZOLE-TRIMETHOPRIM 400-80 MG PO TABS: 1.0000 | ORAL_TABLET | Freq: Every day | ORAL | 0 refills | Status: DC

## 2021-01-16 MED ORDER — PREDNISONE 20 MG PO TABS: 60.0000 mg | ORAL_TABLET | Freq: Every day | ORAL | 0 refills | Status: DC

## 2021-01-16 MED ORDER — MELATONIN 3 MG PO TABS: 3.0000 mg | ORAL_TABLET | Freq: Every evening | ORAL | 0 refills | Status: AC | PRN

## 2021-01-16 MED ORDER — GUAIFENESIN-DM 100-10 MG/5ML PO SYRP: 5.0000 mL | ORAL_SOLUTION | ORAL | 0 refills | Status: DC | PRN

## 2021-01-16 MED ORDER — METFORMIN HCL 500 MG PO TABS: 500.0000 mg | ORAL_TABLET | Freq: Two times a day (BID) | ORAL | 0 refills | Status: DC

## 2021-01-16 NOTE — Progress Notes (Signed)
Pt discharged to home, instructions reviewed with, pt acknowledged understanding. Waiting for wife to pick up pt for home. SRP, RN

## 2021-01-16 NOTE — Discharge Summary (Signed)
Physician Discharge Summary  Wesley Hancock WUX:[id number] DOB: 08/13/1971 DOA: 01/13/2021  PCP: Inda Coke, PA  Admit date: 01/13/2021 Discharge date: 01/16/2021  Admitted From: Home Discharge disposition: Home   Code Status: Full Code  Diet Recommendation: Low-carb diet  Discharge Diagnosis:   Principal Problem:   Shortness of breath Active Problems:   Mixed hyperlipidemia   Antiphospholipid syndrome ([place])   Pneumonia   History of pulmonary embolism   Multinodular goiter   Hyperglycemia  Chief Complaint  Patient presents with  . Chest Pain  . Shortness of Breath   Brief narrative: Wesley Hancock is a 50 y.o. male, vaccinated against COVID-19 with PMH significant for antiphospholipid syndrome, DVT and PE currently on Lovenox, COVID-19 in December, hypertension, hyperlipidemia, multinodular goiter, nutcracker esophagus, CKD 2, OSA, obesity.  Patient presented to the ED on 2/14 with chest pain and exertional shortness of breath for 5 days.  He was concerned with the chance of recurrent pulmonary embolism and hence presented to ED.   He believes he has had mold exposure in his house where he and his wife moved to roughly 2 years ago, prior to the onset of symptoms.  Over the course of 2 years, he has had multiple CT scans which showed concerns of multifocal pneumonia that was treated but he never fully recovered.  In the ED, patient was afebrile, blood pressure elevated to 150s, oxygen saturation in 90s on room air. Lab with creatinine elevated 1.33, WBC count normal COVID-19 PCR positive. CT angio chest showed worsening bilateral extensive airspace opacity, suggestive of atypical pneumonia, stable reactive adenopathy.  It also showed hepatic steatosis and thyroid nodules  Patient was admitted to hospitalist service. Pulmonary consultation was obtained. 2/15, patient underwent bronchoscopy. Last 24 hours, patient has had significant improvement with IV steroids.   He is able to ambulate comfortably without oxygen.  Subjective: Patient was seen and examined this morning.  Not on oxygen at rest.  Later he was seen ambulating in the hallway without distress.  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  Recurrent similar symptoms over the course of 2 years with persistent pulmonary infiltrates.  CT scan on this admission with worsening infiltrates. -With a known history of antiphospholipid syndrome, need to consider noninfectious/autoimmune mediated process. -Pulmonary consult appreciated.   -2/15, patient underwent bronchoscopy and lavage.   -Inflammatory work-up showed positive ANA with elevated dsDNA, RNP, CRP and ESR -Per pulmonary recommendation, patient received IV Solu-Medrol 60 mg for 4 doses with significant improvement.  He will be discharged home on oral prednisone 60 mg daily as well as Bactrim single strength daily for PCP prophylaxis.  I wrote for a month supply of these.  He will see pulmonologist Dr. Erin Fulling at the office on March 3.  -Patient is already on Protonix daily at home for GERD.    Acute respiratory failure with hypoxia -He was requiring up to 5 L of oxygen yesterday.  With IV steroids, he has felt significant improvement.  Currently able to ambulate on room air.  No oxygen requirement at discharge  Hyperglycemia -A1c 5.5 on 2/16 -Blood sugar level running high because of steroids.  He may need to be on prednisone for several weeks.  I will discharge him on Metformin 500 mg twice daily.  Need to continue monitoring blood sugar as an outpatient. Recent Labs  Lab 01/15/21 1314 01/15/21 1749 01/15/21 1958 01/16/21 0740 01/16/21 1139  GLUCAP 164* 207* 129*  129* 194*   Acute respiratory failure with hypoxia -Currently requiring 5 L oxygen by nasal cannula.  Wean down as tolerated.  Multinodular goiter Noted on recent thyroid US TSH  unremarkable, plans to schedule outpatient FNA Recent Labs    01/10/21 1514  TSH 0.91   History of DVT and PE in the setting of antiphospholipid syndrome Continue full dose Lovenox  Hypertension Continue home telmisartan-HCTZ  Hyperlipidemia Continue statin  Obesity - Body mass index is 36.49 kg/m. Patient has been advised to make an attempt to improve diet and exercise patterns to aid in weight loss.  Possible OSA -Patient already underwent sleep study as an outpatient and is waiting for delivery of CPAP.  Stable for discharge to home today.  Wound care:    Discharge Exam:   Vitals:   01/15/21 0441 01/15/21 1203 01/15/21 1958 01/16/21 0429  BP: 110/76 123/79 136/78 134/81  Pulse: 86 80 76 71  Resp: _0 Temp: 98.4 F (36.9 C) 98.2 F (36.8 C) 98.4 F (36.9 C) 98.3 F (36.8 C)  TempSrc: Oral Oral Oral Oral  SpO2: 95% 100% 96% 95%  Weight:      Height:        Body mass index is 36.49 kg/m.  General exam: Pleasant, middle-aged Caucasian male.  Not in distress Skin: No rashes, lesions or ulcers. HEENT: Atraumatic, normocephalic, no obvious bleeding Lungs: Clear to auscultate bilaterally CVS: Regular rate and rhythm, no murmur GI/Abd soft, nontender, nondistended, bowel sound present CNS: Alert, awake, oriented x3 Psychiatry: Mood appropriate Extremities: No pedal edema, no calf tenderness  Follow ups:   Discharge Instructions    Diet - low sodium heart healthy   Complete by: As directed    Diet Carb Modified   Complete by: As directed    Increase activity slowly   Complete by: As directed       Follow-up Information    Freddi Starr, MD Follow up on 01/30/2021.   Specialty: Pulmonary Disease Why: 9:30am Contact information: [address] [place] [address] [phone]        Inda Coke, [place] Follow up.   Specialty: Physician Assistant Contact information: [place]  [id number] [phone]               Recommendations for Outpatient Follow-Up:   1. Follow-up with PCP as an outpatient 2. Follow-up with pulmonology as an outpatient.  Discharge Instructions:  Follow with Primary MD Inda Coke, PA in 7 days   Get CBC/BMP checked in next visit within 1 week by PCP or SNF MD ( we routinely change or add medications that can affect your baseline labs and fluid status, therefore we recommend that you get the mentioned basic workup next visit with your PCP, your PCP may decide not to get them or add new tests based on their clinical decision)  On your next visit with your PCP, please Get Medicines reviewed and adjusted.  Please request your PCP  to go over all Hospital Tests and Procedure/Radiological results at the follow up, please get all Hospital records sent to your Prim MD by signing hospital release before you go home.  Activity: As tolerated with Full fall precautions use walker/cane & assistance as needed  For Heart failure patients - Check your Weight same time everyday, if you gain over 2 pounds, or you develop in leg swelling, experience more shortness of breath or chest pain, call your Primary MD immediately.  Follow Cardiac Low Salt Diet and 1.5 lit/day fluid restriction.  If you have smoked or chewed Tobacco in the last 2 yrs please stop smoking, stop any regular Alcohol  and or any Recreational drug use.  If you experience worsening of your admission symptoms, develop shortness of breath, life threatening emergency, suicidal or homicidal thoughts you must seek medical attention immediately by calling 911 or calling your MD immediately  if symptoms less severe.  You Must read complete instructions/literature along with all the possible adverse reactions/side effects for all the Medicines you take and that have been prescribed to you. Take any new Medicines after you have completely understood and accpet all the possible adverse  reactions/side effects.   Do not drive, operate heavy machinery, perform activities at heights, swimming or participation in water activities or provide baby sitting services if your were admitted for syncope or siezures until you have seen by Primary MD or a Neurologist and advised to do so again.  Do not drive when taking Pain medications.  Do not take more than prescribed Pain, Sleep and Anxiety Medications  Wear Seat belts while driving.   Please note You were cared for by a hospitalist during your hospital stay. If you have any questions about your discharge medications or the care you received while you were in the hospital after you are discharged, you can call the unit and asked to speak with the hospitalist on call if the hospitalist that took care of you is not available. Once you are discharged, your primary care physician will handle any further medical issues. Please note that NO REFILLS for any discharge medications will be authorized once you are discharged, as it is imperative that you return to your primary care physician (or establish a relationship with a primary care physician if you do not have one) for your aftercare needs so that they can reassess your need for medications and monitor your lab values.    Allergies as of 01/16/2021      Reactions   Diltiazem Hcl Diarrhea, Other (See Comments)   Lethargic       Medication List    STOP taking these medications   azithromycin 250 MG tablet Commonly known as: ZITHROMAX   enoxaparin 120 MG/0.8ML injection Commonly known as: LOVENOX   ibuprofen 200 MG tablet Commonly known as: ADVIL     TAKE these medications   acetaminophen 500 MG tablet Commonly known as: TYLENOL Take 1,000 mg by mouth every 6 (six) hours as needed for mild pain.   buPROPion 150 MG 24 hr tablet Commonly known as: WELLBUTRIN XL Take 1 tablet daily   escitalopram 20 MG tablet Commonly known as: LEXAPRO TAKE 1 TABLET BY MOUTH EVERY DAY    ezetimibe 10 MG tablet Commonly known as: ZETIA Take 1 tablet (10 mg total) by mouth daily.   fluticasone 50 MCG/ACT nasal spray Commonly known as: FLONASE Place 1 spray into both nostrils daily as needed for allergies.   guaiFENesin-dextromethorphan 100-10 MG/5ML syrup Commonly known as: ROBITUSSIN DM Take 5 mLs by mouth every 4 (four) hours as needed for cough.   ketoconazole 2 % cream Commonly known as: NIZORAL APPLY TO AFFECTED AREA 1-2 TIMES DAILY   melatonin 3 MG Tabs tablet Take 1 tablet (3 mg total) by mouth at bedtime as needed.   metFORMIN 500 MG tablet Commonly known as: Glucophage Take 1 tablet (500 mg total) by mouth 2 (two) times daily with a meal.   nitroGLYCERIN 0.4 MG SL  tablet Commonly known as: NITROSTAT Place 1 tablet (0.4 mg total) under the tongue every 5 (five) minutes as needed for chest pain.   pantoprazole 40 MG tablet Commonly known as: Protonix Take 1 tablet (40 mg total) by mouth 2 (two) times daily.   predniSONE 20 MG tablet Commonly known as: DELTASONE Take 3 tablets (60 mg total) by mouth daily.   PROBIOTIC PO Take 1 capsule by mouth daily.   rosuvastatin 20 MG tablet Commonly known as: CRESTOR Take 1 tablet (20 mg total) by mouth daily.   sulfamethoxazole-trimethoprim 400-80 MG tablet Commonly known as: BACTRIM Take 1 tablet by mouth daily.   telmisartan-hydrochlorothiazide 80-25 MG tablet Commonly known as: MICARDIS HCT TAKE 1 TABLET BY MOUTH EVERY DAY       Time coordinating discharge: 35 minutes  The results of significant diagnostics from this hospitalization (including imaging, microbiology, ancillary and laboratory) are listed below for reference.    Procedures and Diagnostic Studies:   DG Chest 2 View  Result Date: 01/13/2021 CLINICAL DATA:  Chest pain, shortness of breath. EXAM: CHEST - 2 VIEW COMPARISON:  June 26, 2020. FINDINGS: The heart size and mediastinal contours are within normal limits. No pneumothorax or  pleural effusion is noted. Stable faint bilateral lung opacities are noted concerning for multifocal inflammation or postinfectious scarring. The visualized skeletal structures are unremarkable. IMPRESSION: Stable faint bilateral lung opacities are noted concerning for multifocal inflammation or postinfectious scarring. Electronically Signed   By: Marijo Conception M.D.   On: 01/13/2021 09:36   CT Angio Chest PE W and/or Wo Contrast  Result Date: 01/13/2021 CLINICAL DATA:  Chest pain and shortness of breath EXAM: CT ANGIOGRAPHY CHEST WITH CONTRAST TECHNIQUE: Multidetector CT imaging of the chest was performed using the standard protocol during bolus administration of intravenous contrast. Multiplanar CT image reconstructions and MIPs were obtained to evaluate the vascular anatomy. CONTRAST:  147mL OMNIPAQUE IOHEXOL 350 MG/ML SOLN COMPARISON:  CT angiogram chest November 26, 2020; chest radiograph January 13, 2021; thyroid ultrasound January 07, 2021. Earlier chest CT examinations from July 2021 also compared. FINDINGS: Cardiovascular: There is no demonstrable pulmonary embolus. There is no thoracic aortic aneurysm or dissection. Visualized vessels appear. Note the right innominate and left common carotid arteries arise as a common trunk, an anatomic variant. There is aortic atherosclerosis. There are foci coronary artery calcification. There is no pericardial effusion or pericardial thickening. Mediastinum/Nodes: There is again noted a mass arising from the right lobe of the thyroid measuring 2.4 x 1 4 cm which was evaluated by ultrasound six days prior. There is also a 1.1 x 0.6 cm mass arising from the inferior left thyroid, stable. Multiple enlarged lymph nodes are again noted without appreciable change compared to prior CT. Largest lymph node is in the left hilar region measuring 2.2 x 1.5 cm. Most other enlarged lymph nodes measure slightly greater than 1 cm in short axis dimension. There also multiple  subcentimeter lymph nodes. There is been no progression of adenopathy compared to most recent study. No esophageal lesions are evident. Lungs/Pleura: Multifocal areas of airspace opacity throughout the lungs again noted. In comparison with the most recent CT, there are multiple new areas of opacity in the upper lobes with more confluent opacity in the lower lobes. There is patchy consolidation in the lower lobes which was not present previously. No pleural effusions are evident. Upper Abdomen: There is hepatic steatosis. Liver appears prominent although incompletely visualized. The gallbladder is absent. Visualized upper abdominal structures otherwise  appear normal. Musculoskeletal: No blastic or lytic bone lesions are evident. No chest wall lesions. Review of the MIP images confirms the above findings. IMPRESSION: 1. No demonstrable pulmonary embolus. No thoracic aortic aneurysm or dissection. There are foci of aortic atherosclerosis as well as foci of coronary artery calcification. 2. Extensive airspace opacity bilaterally with overall increased compared to December 2021. Areas of patchy consolidation noted in several areas. The overall appearance is most indicative of atypical organism pneumonia. A degree of bacterial superinfection is question. Note that there has been persistence of multifocal airspace opacity for several months. This circumstance may warrant Pulmonary Medicine consultation with consideration for bronchoscopy for further assessment with respect to etiology for the parenchymal lung lesions. 3. Stable adenopathy of uncertain etiology. Given the parenchymal lung changes, this adenopathy could have reactive etiology. 4. Hepatic steatosis. Liver appears prominent although incompletely visualized. Gallbladder absent. 5. Thyroid nodules with assessment by thyroid ultrasound 6 days prior. Please see recent thyroid ultrasound report with respect to thyroid nodular assessment. Aortic Atherosclerosis  (ICD10-I70.0). Electronically Signed   By: Lowella Grip III M.D.   On: 01/13/2021 11:17     Labs:   Basic Metabolic Panel: Recent Labs  Lab 01/13/21 0944 01/14/21 0405 01/15/21 0427 01/16/21 0420  NA 140 138 136 138  K 4.4 3.5 4.1 4.5  CL 101 101 101 102  CO2 _0 GLUCOSE 95 99 175* 166*  BUN 21* 16 21* 21*  CREATININE 1.33* 1.10 1.20 1.23  CALCIUM 9.6 9.2 8.9 9.6   GFR Estimated Creatinine Clearance: 104.8 mL/min (by C-G formula based on SCr of 1.23 mg/dL). Liver Function Tests: Recent Labs  Lab 01/13/21 0944  AST 31  ALT 61*  ALKPHOS 46  BILITOT 0.8  PROT 7.6  ALBUMIN 3.8   Recent Labs  Lab 01/13/21 0944  LIPASE 42   No results for input(s): AMMONIA in the last 168 hours. Coagulation profile No results for input(s): INR, PROTIME in the last 168 hours.  CBC: Recent Labs  Lab 01/13/21 0944 01/14/21 0405 01/15/21 0427 01/16/21 0420  WBC 10.1 6.9 8.3 11.9*  HGB 14.6 14.3 13.8 14.5  HCT 42.7 42.6 41.0 44.1  MCV 90.1 90.4 90.9 93.0  PLT 212 216 229 242   Cardiac Enzymes: No results for input(s): CKTOTAL, CKMB, CKMBINDEX, TROPONINI in the last 168 hours. BNP: Invalid input(s): POCBNP CBG: Recent Labs  Lab 01/15/21 1314 01/15/21 1749 01/15/21 1958 01/16/21 0740 01/16/21 1139  GLUCAP 164* 207* 129* 129* 194*   D-Dimer No results for input(s): DDIMER in the last 72 hours. Hgb A1c Recent Labs    01/15/21 0427  HGBA1C 5.5   Lipid Profile No results for input(s): CHOL, HDL, LDLCALC, TRIG, CHOLHDL, LDLDIRECT in the last 72 hours. Thyroid function studies No results for input(s): TSH, T4TOTAL, T3FREE, THYROIDAB in the last 72 hours.  Invalid input(s): FREET3 Anemia work up No results for input(s): VITAMINB12, FOLATE, FERRITIN, TIBC, IRON, RETICCTPCT in the last 72 hours. Microbiology Recent Results (from the past 240 hour(s))  Resp Panel by RT-PCR (Flu A&B, Covid) Nasopharyngeal Swab     Status: Abnormal   Collection Time:  01/13/21 12:35 PM   Specimen: Nasopharyngeal Swab; Nasopharyngeal(NP) swabs in vial transport medium  Result Value Ref Range Status   SARS Coronavirus 2 by RT PCR POSITIVE (A) NEGATIVE Final    Comment: RESULT CALLED TO, READ BACK BY AND VERIFIED WITH: C.FRANKLIN AT 1816 ON 02.14.22 BY N.THOMPSON (NOTE) SARS-CoV-2 target nucleic acids are DETECTED.  The SARS-CoV-2 RNA is generally detectable in upper respiratory specimens during the acute phase of infection. Positive results are indicative of the presence of the identified virus, but do not rule out bacterial infection or co-infection with other pathogens not detected by the test. Clinical correlation with patient history and other diagnostic information is necessary to determine patient infection status. The expected result is Negative.  Fact Sheet for Patients: EntrepreneurPulse.com.au  Fact Sheet for Healthcare Providers: IncredibleEmployment.be  This test is not yet approved or cleared by the [place] FDA and  has been authorized for detection and/or diagnosis of SARS-CoV-2 by FDA under an Emergency Use Authorization (EUA).  This EUA will remain in effect (meaning this  test can be used) for the duration of  the COVID-19 declaration under Section 564(b)(1) of the Act, 21 U.S.C. section 360bbb-3(b)(1), unless the authorization is terminated or revoked sooner.     Influenza A by PCR NEGATIVE NEGATIVE Final   Influenza B by PCR NEGATIVE NEGATIVE Final    Comment: (NOTE) The Xpert Xpress SARS-CoV-2/FLU/RSV plus assay is intended as an aid in the diagnosis of influenza from Nasopharyngeal swab specimens and should not be used as a sole basis for treatment. Nasal washings and aspirates are unacceptable for Xpert Xpress SARS-CoV-2/FLU/RSV testing.  Fact Sheet for Patients: EntrepreneurPulse.com.au  Fact Sheet for Healthcare  Providers: IncredibleEmployment.be  This test is not yet approved or cleared by the [place] FDA and has been authorized for detection and/or diagnosis of SARS-CoV-2 by FDA under an Emergency Use Authorization (EUA). This EUA will remain in effect (meaning this test can be used) for the duration of the COVID-19 declaration under Section 564(b)(1) of the Act, 21 U.S.C. section 360bbb-3(b)(1), unless the authorization is terminated or revoked.  Performed at [facility], [place] [address] [place], [place] [address]   Respiratory (~20 pathogens) panel by PCR     Status: None   Collection Time: 01/13/21  2:33 PM  Result Value Ref Range Status   Adenovirus NOT DETECTED NOT DETECTED Final   Coronavirus 229E NOT DETECTED NOT DETECTED Final    Comment: (NOTE) The Coronavirus on the Respiratory Panel, DOES NOT test for the novel  Coronavirus (2019 nCoV)    Coronavirus HKU1 NOT DETECTED NOT DETECTED Final   Coronavirus NL63 NOT DETECTED NOT DETECTED Final   Coronavirus OC43 NOT DETECTED NOT DETECTED Final   Metapneumovirus NOT DETECTED NOT DETECTED Final   Rhinovirus / Enterovirus NOT DETECTED NOT DETECTED Final   Influenza A NOT DETECTED NOT DETECTED Final   Influenza B NOT DETECTED NOT DETECTED Final   Parainfluenza Virus 1 NOT DETECTED NOT DETECTED Final   Parainfluenza Virus 2 NOT DETECTED NOT DETECTED Final   Parainfluenza Virus 3 NOT DETECTED NOT DETECTED Final   Parainfluenza Virus 4 NOT DETECTED NOT DETECTED Final   Respiratory Syncytial Virus NOT DETECTED NOT DETECTED Final   Bordetella pertussis NOT DETECTED NOT DETECTED Final   Bordetella Parapertussis NOT DETECTED NOT DETECTED Final   Chlamydophila pneumoniae NOT DETECTED NOT DETECTED Final   Mycoplasma pneumoniae NOT DETECTED NOT DETECTED Final    Comment: Performed at [facility], [place]. [address], [place], [place] [address]  Pneumocystis smear by DFA     Status: None    Collection Time: 01/14/21  2:13 PM   Specimen: Bronchial Alveolar Lavage; Respiratory  Result Value Ref Range Status   Specimen Source-[facility] BRONCHIAL ALVEOLAR LAVAGE  Final    Comment: RUL   Pneumocystis jiroveci Ag NEGATIVE  Final    Comment: Performed at [place] [facility] Med Performed at [facility], [place] [address] [place], [place] [address]   Culture, Respiratory w Gram Stain     Status: None (Preliminary result)   Collection Time: 01/14/21  2:13 PM   Specimen: Bronchial Alveolar Lavage; Respiratory  Result Value Ref Range Status   Specimen Description   Final    BRONCHIAL ALVEOLAR LAVAGE RUL Performed at [place] [address] [place], [place] [address]    Special Requests   Final    NONE Performed at [facility], [place] [address], [place], [place] [address]    Gram Stain NO WBC SEEN NO ORGANISMS SEEN   Final   Culture   Final    NO GROWTH < 24 HOURS Performed at [facility], [place] [address] [place], [place] [address]    Report Status PENDING  Incomplete  Culture, Respiratory w Gram Stain     Status: None (Preliminary result)   Collection Time: 01/14/21  2:20 PM   Specimen: Bronchial Alveolar Lavage; Respiratory  Result Value Ref Range Status   Specimen Description   Final    BRONCHIAL ALVEOLAR LAVAGE LUL Performed at [place] [address] [place], [place] [address]    Special Requests   Final    NONE Performed at [facility], [place] [address], [place], [place] [address]    Gram Stain   Final    RARE WBC PRESENT, PREDOMINANTLY PMN NO ORGANISMS SEEN    Culture   Final    NO GROWTH < 24 HOURS Performed at [place] [address] [place], [place] [address]    Report Status PENDING  Incomplete  Pneumocystis smear by DFA     Status: None   Collection Time: 01/14/21  2:20 PM  Result Value Ref Range Status   Specimen Source-[facility]  BRONCHIAL ALVEOLAR LAVAGE  Final    Comment: LUL   Pneumocystis jiroveci Ag NEGATIVE  Final    Comment: Performed at [place] [facility] Performed at [facility], [place] [address] [place], [place] [address]      Signed: Marlowe Aschoff Elexa Kivi  [facility] 01/16/2021, 2:39 PM

## 2021-01-16 NOTE — Plan of Care (Signed)
  Problem: Education: Goal: Knowledge of General Education information will improve Description: Including pain rating scale, medication(s)/side effects and non-pharmacologic comfort measures 01/16/2021 1520 by Zadie Rhine, RN Outcome: Adequate for Discharge 01/16/2021 1520 by Zadie Rhine, RN Outcome: Progressing   Problem: Clinical Measurements: Goal: Ability to maintain clinical measurements within normal limits will improve 01/16/2021 1520 by Zadie Rhine, RN Outcome: Adequate for Discharge 01/16/2021 1520 by Zadie Rhine, RN Outcome: Progressing Goal: Will remain free from infection Outcome: Adequate for Discharge Goal: Diagnostic test results will improve Outcome: Adequate for Discharge Goal: Respiratory complications will improve Outcome: Adequate for Discharge Goal: Cardiovascular complication will be avoided Outcome: Adequate for Discharge

## 2021-01-16 NOTE — Progress Notes (Signed)
Pt discharge to home, in reviewing  Discharge instructions, Lovenox order clarified prior to discharge. Pt is knowledgeable of how to take med at home and to continue to take med as directed by Hematology Team. Pt ambulated out to lobby without SOB noted. SRP, RN

## 2021-01-16 NOTE — Telephone Encounter (Signed)
Patient has been scheduled as a hospital F/U on March 3rd at 930am. Patient is still admitted, appointment information will be available on AVS.

## 2021-01-16 NOTE — Progress Notes (Signed)
NAME:  Wesley Hancock, MRN:  [id number], DOB:  1971/04/24, LOS: 2 ADMISSION DATE:  01/13/2021, CONSULTATION DATE:  2/14 REFERRING MD:  Neysa Bonito, CHIEF COMPLAINT:  Recurrent PNA     History of Present Illness:  50 year old male with history of DVT/PE due to antiphospholipid syndrome diagnosed in September 2020 who presented to the ER 2/14 w/ cc: chest pain and worsening shortness of breath over 5d period. Describes the CP as center of chest, some radiation to left scapula and arm but much worse pain w/ deep breath. No cough no sick exposure, No HA, sore throat, nasal dc. No wheeze. No LE swelling.   In the ER he had CT scan as presentation was similar to prior presentation when he had PE. He was negative for PE but had diffuse bilateral pulmonary infiltrates which have been recurrent in nature and were the reason for our consult.  Upon review of chest imaging he has had bilateral ground glass infiltrates that follow a bronchovascular pattern for the most part with peripheral areas of involvement possibly involving the pleura. He had CT Chests on 11/26/20, 06/26/20, 05/30/20 and 08/30/2019 all with bilateral scattered groundglass infiltrates. He has a chest radiograph dating back to 2017 with the first signs of pulmonary opacities.  The pulmonary infiltrates were initially thought to be signs of aspiration as he carries the diagnosis of nutcracker esophagus based on esophageal manometry testing in the past.    His quality of life has declined over the past years as he has not been able to be very active due to his shortness of breath.   PFTs 03/08/20 show mild restrictive defect and mild diffusion defect.   Bronchoscopy was performed on 01/14/21 with signs of blood tinged sputum but no over abnormalities of his airways. BALs were performed in the right and left upper lobes. There was blood tinged return from the LUL but on a second BAL it was less bloody appearing. BAL studies are pending at this time.    Inflammatory workup shows positive ANA with elevated dsDNA and RNP. ESR and CRP are elevated.   Infectious workup does show he is positive for Covid 19, but is asymptomatic at this time. Extended respiratory viral panel is negative.     Past Medical History:  Antiphospholipid syndrome, prior PE and DVT on chronic LMWH, recurrent PNA, HTN, HL, multinodular goiter, nutcracker esophagus, CKD stage II, OSA obesity   Significant Hospital Events:  2/14 admitted  Consults:  Pulmonary consult   Procedures:    Significant Diagnostic Tests:  CT angiogram 2/14: 1. No demonstrable pulmonary embolus. No thoracic aortic aneurysm or dissection. There are foci of aortic atherosclerosis as well as foci of coronary artery calcification.2. Extensive airspace opacity bilaterally with overall increased compared to December 2021. Areas of patchy consolidation noted in several areas. The overall appearance is most indicative of atypical organism pneumonia. A degree of bacterial superinfection is question. Note that there has been persistence of multifocal airspace opacity for several months. Micro Data:  COVID 2/14  Antimicrobials:    Interim History / Subjective:  Ports being much improved. Currently off oxygen. O2 saturations 90% on room air with ambulation   Objective   Blood pressure 134/81, pulse 71, temperature 98.3 F (36.8 C), temperature source Oral, resp. rate 16, height _0  (1.88 m), weight 128.9 kg, SpO2 95 %.        Intake/Output Summary (Last 24 hours) at 01/16/2021 1102 Last data filed at 01/16/2021 0930 Gross per 24 hour  Intake 720 ml  Output --  Net 720 ml   Filed Weights   01/14/21 0158 01/14/21 1345  Weight: 128.9 kg 128.9 kg    Examination: General: Well-nourished well-developed middle-aged male who is on room air and in no acute distress at rest. 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 19 2022 good response to steroids now off oxygen reports O2 saturations of 90% while ambulating on room air. No stent left shoulder chest pain is improved Continue steroid Transition to p.o. over the near future and Bactrim Monitor further lab data Follow-up bronchoscopy results Follow-up appointment is been to scheduled with Dr. Erin Fulling    Other historical problems :  Esophageal spasms (H/o) Antiphospholipid syndrome H/o DVT and PE (on LMWH) H/o goiter  H/o OSA (awaiting CPAP) H/o HTN   Best practice (evaluated daily)  Per primary   Labs   CBC: Recent Labs  Lab 01/13/21 0944 01/14/21 0405 01/15/21 0427 01/16/21 0420  WBC 10.1 6.9 8.3 11.9*  HGB 14.6 14.3 13.8 14.5  HCT 42.7 42.6 41.0 44.1  MCV 90.1 90.4 90.9 93.0  PLT 212 216 229 829    Basic Metabolic Panel: Recent Labs  Lab 01/13/21 0944 01/14/21 0405 01/15/21 0427 01/16/21 0420  NA 140 138 136 138  K 4.4 3.5 4.1 4.5  CL 101 101 101 102  CO2 _0 GLUCOSE 95 99 175* 166*  BUN 21* 16 21* 21*  CREATININE 1.33* 1.10 1.20 1.23  CALCIUM 9.6 9.2 8.9 9.6   GFR: Estimated Creatinine Clearance: 104.8 mL/min (by C-G formula based on SCr of 1.23 mg/dL). Recent Labs  Lab 01/13/21 0944 01/14/21 0405 01/15/21 0427 01/16/21 0420  WBC 10.1 6.9 8.3 11.9*    Liver Function Tests: Recent Labs  Lab 01/13/21 0944  AST 31  ALT 61*  ALKPHOS 46  BILITOT 0.8  PROT 7.6  ALBUMIN 3.8   Recent Labs  Lab 01/13/21 0944  LIPASE 42   No results for input(s): AMMONIA in the last 168 hours.  ABG    Component Value Date/Time   TCO2 26 08/30/2019 1329     Coagulation Profile: No results for  input(s): INR, PROTIME in the last 168 hours.  Cardiac Enzymes: No results for input(s): CKTOTAL, CKMB, CKMBINDEX, TROPONINI in the last 168 hours.  HbA1C: Hgb A1c MFr Bld  Date/Time Value Ref Range Status  01/15/2021 04:27 AM 5.5 4.8 - 5.6 % Final    Comment:    (NOTE)         Prediabetes: 5.7 - 6.4         Diabetes: >6.4         Glycemic control for adults with diabetes: <7.0     CBG: Recent Labs  Lab 01/15/21 1314 01/15/21 1749 01/15/21 1958 01/16/21 0740  GLUCAP 164* 207* 129* 129*     Critical care time: NA   Steve Adylee Leonardo ACNP Acute Care Nurse Practitioner [place] Please consult [place] 01/16/2021, 11:03 AM

## 2021-01-16 NOTE — Telephone Encounter (Signed)
Please schedule patient for follow up with me on March 3 at 9:30am for ILD.   Thanks, Wille Glaser

## 2021-01-16 NOTE — Progress Notes (Signed)
SATURATION QUALIFICATIONS: (This note is used to comply with regulatory documentation for home oxygen)  Patient Saturations on Room Air at Rest 99%  Patient Saturations on Room Air while Ambulating 94%  Patient Saturations on N/A Liters of oxygen while Ambulating not required  Please briefly explain why patient needs home oxygen: Not applicable...the patient maintained oxygen levels while ambulating within normal range and without shortness of breath.   SRP, RN

## 2021-01-16 NOTE — Plan of Care (Signed)

## 2021-01-17 LAB — ACID FAST SMEAR (AFB, MYCOBACTERIA)
Acid Fast Smear: NEGATIVE
Acid Fast Smear: NEGATIVE

## 2021-01-17 LAB — CULTURE, RESPIRATORY W GRAM STAIN
Culture: NO GROWTH
Culture: NO GROWTH
Gram Stain: NONE SEEN

## 2021-01-17 LAB — QUANTIFERON-TB GOLD PLUS (RQFGPL)
QuantiFERON Mitogen Value: 1.19 IU/mL
QuantiFERON Nil Value: 0 IU/mL
QuantiFERON TB1 Ag Value: 0.02 IU/mL
QuantiFERON TB2 Ag Value: 0.05 IU/mL

## 2021-01-17 LAB — ANA: Anti Nuclear Antibody (ANA): POSITIVE — AB

## 2021-01-17 LAB — QUANTIFERON-TB GOLD PLUS: QuantiFERON-TB Gold Plus: NEGATIVE

## 2021-01-20 NOTE — Telephone Encounter (Signed)
Dr Annamaria Boots, please advise on pt email in Dr Gustavus Bryant absence today. Note that I responded to him and and that I rec that he go ahead and call his PCP or GI MD if he has one. I told him I would have MD review med list to be sure none of his meds could be causing his dark stools. Thanks!   I was just checking in to see if any of the medications I'm on can cause dark stool. My stool is almost black right now and I want to make sure it's normal.

## 2021-01-20 NOTE — Telephone Encounter (Signed)
Iron and Pepto Bismol may cause black stools, but otherwise I recommend he check with his primary care or GI doctor to have stool tested for blood and to have his blood counts checked. He has been on prednisone which can cause GI bleeding. If he has been coughing up blood and swallowing some, that can do it also.

## 2021-01-21 ENCOUNTER — Telehealth (INDEPENDENT_AMBULATORY_CARE_PROVIDER_SITE_OTHER): Payer: BC Managed Care – PPO | Admitting: Physician Assistant

## 2021-01-21 ENCOUNTER — Encounter: Payer: Self-pay | Admitting: Physician Assistant

## 2021-01-21 VITALS — Ht 74.0 in | Wt 286.0 lb

## 2021-01-21 DIAGNOSIS — J849 Interstitial pulmonary disease, unspecified: Secondary | ICD-10-CM | POA: Diagnosis not present

## 2021-01-21 DIAGNOSIS — M351 Other overlap syndromes: Secondary | ICD-10-CM | POA: Insufficient documentation

## 2021-01-21 DIAGNOSIS — R7309 Other abnormal glucose: Secondary | ICD-10-CM

## 2021-01-21 NOTE — Progress Notes (Signed)
Virtual Visit via Video   I connected with Wesley Hancock on 01/21/21 at 10:00 AM EST by a video enabled telemedicine application and verified that I am speaking with the correct person using two identifiers. Location patient: Home Location provider: [facility], Office Persons participating in the virtual visit: Justinian, Miano PA-C, Anselmo Pickler, LPN   I discussed the limitations of evaluation and management by telemedicine and the availability of in person appointments. The patient expressed understanding and agreed to proceed.  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  He underwent a bronchoscopy and was determined to have mixed connective tissue and lupus related interstitial lung disease.  He was started on IV steroids and had good response.  He is now on 60 mg prednisone daily as well as he was also started on Bactrim.  He was discharged from the hospital on 2/17.  He did have a little bit of dark stools but this was thought to be due to his high-dose prednisone, he is taking his Protonix 40 mg twice daily and this has resolved as of a few days ago per patient.  Pt says he is feeling better, starting to get energy back, SOB is better still has some.   He was also started on Metformin 500 mg twice daily in the hospital for blood sugars.  He has never been on this medication before.  His hemoglobin A1c in the hospital was 5.5.  ROS: See pertinent positives and negatives per HPI.  Patient Active Problem List   Diagnosis Date Noted  . Mixed connective tissue disease ([place]) 01/21/2021  . Multinodular goiter 01/10/2021  . OSA on CPAP 10/11/2020  . Esophageal spasm   . Colon polyp   . Arthritis   . APS (antiphospholipid syndrome) ([place])   . Anxiety   . CKD  (chronic kidney disease) stage 2, GFR 60-89 ml/min 07/11/2020  . Recurrent pneumonia 07/09/2020  . Snoring 07/09/2020  . Witnessed apneic spells 07/09/2020  . Pneumonia 06/26/2020  . Diarrhea 05/21/2020  . Paresthesia of right foot 11/08/2019  . Pulmonary infiltrates 09/10/2019  . Morbid obesity due to excess calories ([place]) c/b hbp/ dvt/PE 09/10/2019  . Acute nonintractable headache 09/06/2019  . Lobar pneumonia ([place]) 08/30/2019  . Acute respiratory failure with hypoxia ([place]) 08/30/2019  . AKI (acute kidney injury) ([place]) 08/30/2019  . DVT (deep venous thrombosis) ([place]) 08/30/2019  . Multiple pulmonary emboli ([place]) 08/22/2019  . Dyspnea on exertion 09/08/2017  . Depression 06/24/2017  . GAD (generalized anxiety disorder) 08/06/2016  . Mixed hyperlipidemia 09/18/2015  . Hypertensive heart disease 09/23/2014  . Nutcracker esophagus 09/21/2014  . Lung nodule 09/21/2014    Social History   Tobacco Use  . Smoking status: Former Smoker    Packs/day: 1.00    Years: 20.00    Pack years: 20.00    Types: Cigarettes    Quit date: 11/30/2013    Years since quitting: 7.1  . Smokeless tobacco: Former Systems developer    Types: Snuff    Quit date: 05/21/2018  Substance Use Topics  . Alcohol use: Yes    Alcohol/week: 5.0 standard drinks    Types: 5 Cans of beer per week    Comment: 1 per day    Current Outpatient Medications:  .  acetaminophen (TYLENOL)  500 MG tablet, Take 1,000 mg by mouth every 6 (six) hours as needed for mild pain., Disp: , Rfl:  .  buPROPion (WELLBUTRIN XL) 150 MG 24 hr tablet, Take 1 tablet daily, Disp: 90 tablet, Rfl: 3 .  escitalopram (LEXAPRO) 20 MG tablet, TAKE 1 TABLET BY MOUTH EVERY DAY (Patient taking differently: Take 20 mg by mouth daily.), Disp: 90 tablet, Rfl: 1 .  fluticasone (FLONASE) 50 MCG/ACT nasal spray, Place 1 spray into both nostrils daily as needed for allergies., Disp: , Rfl:  .  guaiFENesin-dextromethorphan (ROBITUSSIN DM) 100-10 MG/5ML syrup, Take 5 mLs  by mouth every 4 (four) hours as needed for cough., Disp: 118 mL, Rfl: 0 .  melatonin 3 MG TABS tablet, Take 1 tablet (3 mg total) by mouth at bedtime as needed., Disp: 30 tablet, Rfl: 0 .  metFORMIN (GLUCOPHAGE) 500 MG tablet, Take 1 tablet (500 mg total) by mouth 2 (two) times daily with a meal., Disp: 60 tablet, Rfl: 0 .  nitroGLYCERIN (NITROSTAT) 0.4 MG SL tablet, Place 1 tablet (0.4 mg total) under the tongue every 5 (five) minutes as needed for chest pain., Disp: 15 tablet, Rfl: 2 .  pantoprazole (PROTONIX) 40 MG tablet, Take 1 tablet (40 mg total) by mouth 2 (two) times daily., Disp: 180 tablet, Rfl: 1 .  predniSONE (DELTASONE) 20 MG tablet, Take 3 tablets (60 mg total) by mouth daily., Disp: 90 tablet, Rfl: 0 .  Probiotic Product (PROBIOTIC PO), Take 1 capsule by mouth daily., Disp: , Rfl:  .  sulfamethoxazole-trimethoprim (BACTRIM) 400-80 MG tablet, Take 1 tablet by mouth daily., Disp: 30 tablet, Rfl: 0 .  telmisartan-hydrochlorothiazide (MICARDIS HCT) 80-25 MG tablet, TAKE 1 TABLET BY MOUTH EVERY DAY (Patient taking differently: Take 1 tablet by mouth daily.), Disp: 90 tablet, Rfl: 2 .  ezetimibe (ZETIA) 10 MG tablet, Take 1 tablet (10 mg total) by mouth daily., Disp: 90 tablet, Rfl: 3 .  rosuvastatin (CRESTOR) 20 MG tablet, Take 1 tablet (20 mg total) by mouth daily., Disp: 90 tablet, Rfl: 3  Allergies  Allergen Reactions  . Diltiazem Hcl Diarrhea and Other (See Comments)    Lethargic     Objective:   VITALS: Per patient if applicable, see vitals. GENERAL: Alert, appears well and in no acute distress. HEENT: Atraumatic, conjunctiva clear, no obvious abnormalities on inspection of external nose and ears. NECK: Normal movements of the head and neck. CARDIOPULMONARY: No increased WOB. Speaking in clear sentences. I:E ratio WNL.  MS: Moves all visible extremities without noticeable abnormality. PSYCH: Pleasant and cooperative, well-groomed. Speech normal rate and rhythm. Affect is  appropriate. Insight and judgement are appropriate. Attention is focused, linear, and appropriate.  NEURO: CN grossly intact. Oriented as arrived to appointment on time with no prompting. Moves both UE equally.  SKIN: No obvious lesions, wounds, erythema, or cyanosis noted on face or hands.  Assessment and Plan:   Wesley Hancock was seen today for hospitalization follow-up.  Diagnoses and all orders for this visit:  Mixed connective tissue disease ([place]); Interstitial lung disease ([place]) We reviewed his hospitalization and current medications. He is planning to see pulmonary on 01/30/2021 and also after this will probably be establishing with a rheumatologist. He is very understanding of his current medical issues and need for very specific precautions due to high dose of prednisone that he is on. Overall he is doing very well, further management per pulmonary and or rheumatology.  Elevated glucose Discontinue Metformin 500 mg twice daily. Continue to work on decrease  sugar intake and preventing overeating while on prednisone.   I discussed the assessment and treatment plan with the patient. The patient was provided an opportunity to ask questions and all were answered. The patient agreed with the plan and demonstrated an understanding of the instructions.   The patient was advised to call back or seek an in-person evaluation if the symptoms worsen or if the condition fails to improve as anticipated.   CMA or LPN served as scribe during this visit. History, Physical, and Plan performed by medical provider. The above documentation has been reviewed and is accurate and complete.  Time spent with patient today was 25 minutes which consisted of chart review, discussing diagnosis, work up, treatment answering questions and documentation.   [place], [place] 01/21/2021

## 2021-01-21 NOTE — Progress Notes (Deleted)
Wesley Hancock is a 50 y.o. male is here for hospital follow up.  I acted as a Education administrator for [employer], PA-C [employer], LPN   History of Present Illness:   No chief complaint on file.   HPI Hospital f/u Pt was admitted to the hospital on 2/14 for Opacity of lung on imaging study, SOB, Hyperglycemia.  Health Maintenance Due  Topic Date Due  . COLONOSCOPY (Pts 45-61yrs Insurance coverage will need to be confirmed)  Never done    Past Medical History:  Diagnosis Date  . Acute nonintractable headache 09/06/2019  . Acute respiratory failure with hypoxia ([place]) 08/30/2019  . AKI (acute kidney injury) ([place]) 08/30/2019  . Antiphospholipid syndrome ([place]) 10/30/2019  . Anxiety   . APS (antiphospholipid syndrome) ([place])   . Arthritis   . Colon polyp    ? hyperplastic  . Depression   . Diarrhea 05/21/2020  . DVT (deep venous thrombosis) ([place])   . Dyspnea on exertion 09/08/2017   "Quit smoking" 2015 with onset of symptoms in 2016  Spirometry 09/08/2017  Flat f/v loop  - d/c acei 09/08/2017  - 01/04/2020   Walked RA x two laps =  approx 560ft @ fast pace - stopped due to end of study/ min sob with sats of 93 % at the end of the study. - PFT's  03/08/20   FEV1 3.84 (83 % ) ratio 0..90  p 3 % improvement from saba p ? prior to study with DLCO  26.46 (77%) corrects to 4.76 (99%)    . Esophageal spasm   . Essential hypertension 09/23/2014   D/c acei 09/08/2017 due to pseudocopd   . GAD (generalized anxiety disorder) 08/06/2016  . Lobar pneumonia ([place]) 08/30/2019  . Lung nodule 09/21/2014  . Mixed hyperlipidemia 09/18/2015  . Morbid obesity due to excess calories ([place]) c/b hbp/ dvt/PE 09/10/2019  . 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 Nutcracker esophagus   . Paresthesia of right foot  11/08/2019  . Pulmonary infiltrates 09/10/2019   In setting of bilateral PE 08/23/2019 with antiphospholipid syndrome   . Recurrent pneumonia 07/09/2020   Formatting of this note might be different from the original. 06/26/20, 03/08/20  . Snoring 07/09/2020  . Upper airway cough syndrome 01/05/2020   Onset mid Jan 2021 while on otc PPI  - max rx for gerd 01/04/2020 >>>     . Witnessed apneic spells 07/09/2020     Social History   Tobacco Use  . Smoking status: Former Smoker    Packs/day: 1.00    Years: 20.00    Pack years: 20.00    Types: Cigarettes    Quit date: 11/30/2013    Years since quitting: 7.1  . Smokeless tobacco: Former Systems developer    Types: Snuff    Quit date: 05/21/2018  Vaping Use  . Vaping Use: Never used  Substance Use Topics  . Alcohol use: Yes    Alcohol/week: 5.0 standard drinks    Types: 5 Cans of beer per week    Comment: 1 per day  . Drug use: No    Past Surgical History:  Procedure Laterality Date  . BRONCHIAL WASHINGS  01/14/2021   Procedure: BRONCHIAL WASHINGS;  Surgeon: Freddi Starr, MD;  Location: [facility];  Service: Pulmonary;;  . CHOLECYSTECTOMY    .  VIDEO BRONCHOSCOPY N/A 01/14/2021   Procedure: VIDEO BRONCHOSCOPY WITHOUT FLUORO;  Surgeon: Freddi Starr, MD;  Location: [facility];  Service: Pulmonary;  Laterality: N/A;    Family History  Problem Relation Age of Onset  . Diabetes Mother   . Hyperlipidemia Mother   . Hypertension Mother   . Thyroid disease Mother        uncertain type--had surg--no cancer  . Heart disease Father 65  . Hyperlipidemia Father   . Hypertension Father   . Prostate cancer Father 61  . Lung cancer Father 78       Dx 06/18/2017  . Colon polyps Father   . Irritable bowel syndrome Father   . Diverticulitis Father   . Diabetes Sister   . Hyperlipidemia Sister   . Hypertension Sister   . Diabetes Maternal Grandmother   . Heart disease Maternal Grandmother   . Hyperlipidemia Maternal Grandmother   . Hypertension  Maternal Grandmother   . Colon cancer Maternal Grandmother   . Heart disease Maternal Grandfather   . Hyperlipidemia Maternal Grandfather   . Hypertension Maternal Grandfather   . Stroke Maternal Grandfather   . Liver cancer Maternal Grandfather   . Irritable bowel syndrome Maternal Grandfather   . Heart disease Paternal Grandmother     PMHx, SurgHx, SocialHx, FamHx, Medications, and Allergies were reviewed in the Visit Navigator and updated as appropriate.   Patient Active Problem List   Diagnosis Date Noted  . Hyperglycemia 01/16/2021  . Shortness of breath 01/13/2021  . Multinodular goiter 01/10/2021  . OSA on CPAP 10/11/2020  . Esophageal spasm   . Colon polyp   . Arthritis   . APS (antiphospholipid syndrome) ([place])   . Anxiety   . CKD (chronic kidney disease) stage 2, GFR 60-89 ml/min 07/11/2020  . History of pulmonary embolism 07/09/2020  . Recurrent pneumonia 07/09/2020  . Snoring 07/09/2020  . Witnessed apneic spells 07/09/2020  . Pneumonia 06/26/2020  . Diarrhea 05/21/2020  . Upper airway cough syndrome 01/05/2020  . Paresthesia of right foot 11/08/2019  . Antiphospholipid syndrome ([place]) 10/30/2019  . Pulmonary infiltrates 09/10/2019  . Morbid obesity due to excess calories ([place]) c/b hbp/ dvt/PE 09/10/2019  . Acute nonintractable headache 09/06/2019  . Lobar pneumonia ([place]) 08/30/2019  . Acute respiratory failure with hypoxia ([place]) 08/30/2019  . AKI (acute kidney injury) ([place]) 08/30/2019  . DVT (deep venous thrombosis) ([place]) 08/30/2019  . Multiple pulmonary emboli ([place]) 08/22/2019  . Dyspnea on exertion 09/08/2017  . Depression 06/24/2017  . GAD (generalized anxiety disorder) 08/06/2016  . Mixed hyperlipidemia 09/18/2015  . Hypertensive heart disease 09/23/2014  . Nutcracker esophagus 09/21/2014  . Lung nodule 09/21/2014    Social History   Tobacco Use  . Smoking status: Former Smoker    Packs/day: 1.00    Years: 20.00    Pack years: 20.00    Types:  Cigarettes    Quit date: 11/30/2013    Years since quitting: 7.1  . Smokeless tobacco: Former Systems developer    Types: Snuff    Quit date: 05/21/2018  Vaping Use  . Vaping Use: Never used  Substance Use Topics  . Alcohol use: Yes    Alcohol/week: 5.0 standard drinks    Types: 5 Cans of beer per week    Comment: 1 per day  . Drug use: No    Current Medications and Allergies:    Current Outpatient Medications:  .  acetaminophen (TYLENOL) 500 MG tablet, Take 1,000 mg by mouth every 6 (six)  hours as needed for mild pain., Disp: , Rfl:  .  buPROPion (WELLBUTRIN XL) 150 MG 24 hr tablet, Take 1 tablet daily, Disp: 90 tablet, Rfl: 3 .  escitalopram (LEXAPRO) 20 MG tablet, TAKE 1 TABLET BY MOUTH EVERY DAY (Patient taking differently: Take 20 mg by mouth daily.), Disp: 90 tablet, Rfl: 1 .  ezetimibe (ZETIA) 10 MG tablet, Take 1 tablet (10 mg total) by mouth daily., Disp: 90 tablet, Rfl: 3 .  fluticasone (FLONASE) 50 MCG/ACT nasal spray, Place 1 spray into both nostrils daily as needed for allergies., Disp: , Rfl:  .  guaiFENesin-dextromethorphan (ROBITUSSIN DM) 100-10 MG/5ML syrup, Take 5 mLs by mouth every 4 (four) hours as needed for cough., Disp: 118 mL, Rfl: 0 .  ketoconazole (NIZORAL) 2 % cream, APPLY TO AFFECTED AREA 1-2 TIMES DAILY (Patient not taking: Reported on 01/13/2021), Disp: 15 g, Rfl: 2 .  melatonin 3 MG TABS tablet, Take 1 tablet (3 mg total) by mouth at bedtime as needed., Disp: 30 tablet, Rfl: 0 .  metFORMIN (GLUCOPHAGE) 500 MG tablet, Take 1 tablet (500 mg total) by mouth 2 (two) times daily with a meal., Disp: 60 tablet, Rfl: 0 .  nitroGLYCERIN (NITROSTAT) 0.4 MG SL tablet, Place 1 tablet (0.4 mg total) under the tongue every 5 (five) minutes as needed for chest pain., Disp: 15 tablet, Rfl: 2 .  pantoprazole (PROTONIX) 40 MG tablet, Take 1 tablet (40 mg total) by mouth 2 (two) times daily., Disp: 180 tablet, Rfl: 1 .  predniSONE (DELTASONE) 20 MG tablet, Take 3 tablets (60 mg total) by  mouth daily., Disp: 90 tablet, Rfl: 0 .  Probiotic Product (PROBIOTIC PO), Take 1 capsule by mouth daily., Disp: , Rfl:  .  rosuvastatin (CRESTOR) 20 MG tablet, Take 1 tablet (20 mg total) by mouth daily., Disp: 90 tablet, Rfl: 3 .  sulfamethoxazole-trimethoprim (BACTRIM) 400-80 MG tablet, Take 1 tablet by mouth daily., Disp: 30 tablet, Rfl: 0 .  telmisartan-hydrochlorothiazide (MICARDIS HCT) 80-25 MG tablet, TAKE 1 TABLET BY MOUTH EVERY DAY (Patient taking differently: Take 1 tablet by mouth daily.), Disp: 90 tablet, Rfl: 2  Allergies  Allergen Reactions  . Diltiazem Hcl Diarrhea and Other (See Comments)    Lethargic     Review of Systems   ROS  Vitals:  There were no vitals filed for this visit.   There is no height or weight on file to calculate BMI.   Physical Exam:    Physical Exam   Assessment and Plan:    There are no diagnoses linked to this encounter.    ***  Inda Coke, PA-C [place] [place] 01/21/2021  Follow-up: No follow-ups on file.

## 2021-01-22 ENCOUNTER — Telehealth: Payer: Self-pay | Admitting: Pulmonary Disease

## 2021-01-22 NOTE — Telephone Encounter (Signed)
Please let the patient know I have spoken to our two other ILD specialists and they recommend him being seen by rheumatology as well. If you could please place a referral for Mixed Connective Tissue Disease/Lupus related ILD I would appreciate it.   Thanks, Wille Glaser

## 2021-01-23 NOTE — Telephone Encounter (Signed)
Left message for patient to call back  

## 2021-01-30 ENCOUNTER — Encounter: Payer: Self-pay | Admitting: Pulmonary Disease

## 2021-01-30 ENCOUNTER — Other Ambulatory Visit: Payer: Self-pay

## 2021-01-30 ENCOUNTER — Ambulatory Visit: Payer: BC Managed Care – PPO | Admitting: Pulmonary Disease

## 2021-01-30 VITALS — BP 136/86 | HR 110 | Temp 97.6°F | Ht 74.0 in | Wt 287.0 lb

## 2021-01-30 DIAGNOSIS — Z9989 Dependence on other enabling machines and devices: Secondary | ICD-10-CM

## 2021-01-30 DIAGNOSIS — G4733 Obstructive sleep apnea (adult) (pediatric): Secondary | ICD-10-CM

## 2021-01-30 DIAGNOSIS — J849 Interstitial pulmonary disease, unspecified: Secondary | ICD-10-CM | POA: Diagnosis not present

## 2021-01-30 MED ORDER — PREDNISONE 20 MG PO TABS: 40.0000 mg | ORAL_TABLET | Freq: Every day | ORAL | 0 refills | Status: DC

## 2021-01-30 MED ORDER — MYCOPHENOLATE MOFETIL 500 MG PO TABS: 500.0000 mg | ORAL_TABLET | Freq: Two times a day (BID) | ORAL | 6 refills | Status: DC

## 2021-01-30 MED ORDER — SULFAMETHOXAZOLE-TRIMETHOPRIM 400-80 MG PO TABS: 1.0000 | ORAL_TABLET | Freq: Every day | ORAL | 0 refills | Status: DC

## 2021-01-30 NOTE — Patient Instructions (Addendum)
Take 40mg  of prednisone daily  Take bactrim daily   Start mycophenolate 500mg  twice daily. Information on this medication below.  Mycophenolate capsules What is this medicine? MYCOPHENOLATE MOFETIL (mye koe FEN oh late MOE fe til) is used to decrease the immune system's response to a transplanted organ. This medicine may be used for other purposes; ask your health care provider or pharmacist if you have questions. COMMON BRAND NAME(S): CellCept What should I tell my health care provider before I take this medicine? They need to know if you have any of these conditions:  anemia or other blood disorder  cancer  diarrhea  immune system problems  infection (especially a viral infection such as chickenpox, cold sores, or herpes)  kidney disease  recently received or scheduled to receive a vaccination  stomach problems  an unusual or allergic reaction to mycophenolate mofetil, other medicines, foods, dyes, or preservatives  pregnant or trying to get pregnant  breast-feeding How should I use this medicine? Take this medicine by mouth with a full glass of water. Follow the directions on the prescription label. Take this medicine on an empty stomach, at least 1 hour before or 2 hours after food. Do not take with food unless your doctor approves. Swallow the medicine whole. Do not cut, crush, or chew the medicine. If the medicine is broken or is not intact, do not get the powder on your skin or eyes. If contact occurs, rinse thoroughly with water. Take your medicine at regular intervals. Do not take your medicine more often than directed. Do not stop taking except on your doctor's advice. A special MedGuide will be given to you by the pharmacist with each prescription and refill. Be sure to read this information carefully each time. Talk to your pediatrician regarding the use of this medicine in children. While this drug may be prescribed for selected conditions, precautions do  apply. Overdosage: If you think you have taken too much of this medicine contact a poison control center or emergency room at once. NOTE: This medicine is only for you. Do not share this medicine with others. What if I miss a dose? If you miss a dose, take it as soon as you can. If it is almost time for your next dose, take only that dose. Do not take double or extra doses. What may interact with this medicine? Do not take this medicine with any of the following medications:  live vaccines This medicine may also interact with the following medications:  acyclovir or valacyclovir  azathioprine  birth control pills  certain antibiotics like ciprofloxacin, levofloxacin, norfloxacin, trimethoprim; sulfamethoxazole, penicillin, and amoxicillin; clavulanic acid  certain medicines for stomach problems like lansoprazole, omeprazole, or pantoprazole  cyclosporine  ganciclovir or valganciclovir  isavuconazonium  medicines for cholesterol like cholestyramine and colestipol  metronidazole  other mycophenolate medicines  probenecid  rifampin  sevelamer  stomach acid blockers like magnesium hydroxide and aluminum hydroxide  telmisartan This list may not describe all possible interactions. Give your health care provider a list of all the medicines, herbs, non-prescription drugs, or dietary supplements you use. Also tell them if you smoke, drink alcohol, or use illegal drugs. Some items may interact with your medicine. What should I watch for while using this medicine? Visit your doctor or health care professional for regular checks on your progress. You will need frequent blood checks during the first few months you are receiving the medicine. This medicine can make you more sensitive to the sun. Keep out of  the sun. If you cannot avoid being in the sun, wear protective clothing and use sunscreen. Do not use sun lamps or tanning beds/booths. This medicine can cause birth defects. Do  not get pregnant while taking this drug. Females will need to have a negative pregnancy test before starting this medicine. If sexually active, use 2 reliable forms of birth control together for 4 weeks before starting this medicine, while you are taking this medicine, and for 6 weeks after you stop taking this medicine. Birth control pills alone may not work properly while you are taking this medicine. If you think that you might be pregnant talk to your doctor right away. Males who get this medicine must use a condom during sex with females who can get pregnant. If you get a woman pregnant, the baby could have birth defects. The baby could die before they are born. You will need to continue wearing a condom for 90 days after stopping the medicine. Tell your health care provider right away if your partner becomes pregnant while you are taking this medicine. Do not donate sperm while taking this medicine or for 90 days after stopping it. If you get a cold or other infection while receiving this medicine, call your doctor or health care professional. Do not treat yourself. The medicine may decrease your body's ability to fight infections. Do not give blood while taking this medicine or for 6 weeks after stopping it. You may get drowsy or dizzy. Do not drive, use machinery, or do anything that needs mental alertness until you know how this medicine affects you. Do not stand up or sit up quickly, especially if you are an older patient. This reduces the risk of dizzy or fainting spells. What side effects may I notice from receiving this medicine? Side effects that you should report to your doctor or health care professional as soon as possible:  allergic reactions like skin rash, itching or hives, swelling of the face, lips, or tongue  bloody, dark, or tarry stools  changes in vision  dizziness  fever, chills or any other sign of infection  unusual bleeding or bruising  unusually weak or tired Side  effects that usually do not require medical attention (report to your doctor or health care professional if they continue or are bothersome):  constipation  diarrhea  trouble sleeping  loss of appetite  nausea, vomiting This list may not describe all possible side effects. Call your doctor for medical advice about side effects. You may report side effects to FDA at 1-800-FDA-1088. Where should I keep my medicine? Keep out of the reach of children. Store at room temperature between 15 and 30 degrees C (59 and 86 degrees F). Throw away any unused medicine after the expiration date. NOTE: This sheet is a summary. It may not cover all possible information. If you have questions about this medicine, talk to your doctor, pharmacist, or health care provider.  2021 Elsevier/Gold Standard (2019-04-03 18:45:08)

## 2021-01-30 NOTE — Progress Notes (Signed)
Synopsis: Pulmonary follow up for ILD diagnosis in 12/2020  Subjective:   PATIENT ID: Wesley Hancock GENDER: male DOB: October 29, 1971, MRN: [id number]  HPI  Chief Complaint  Patient presents with  . Follow-up    Still having Sob since hosp, sats are within normal range. Feeling exhausted since taking the prednisone.    Wesley Hancock is a 50 year old male, former smoker with history of DVT/PE due to antiphospholipid syndrome diagnosed in September 2020 who presented to the ER 01/13/21 with chest pain and worsening shortness of breath. The chest pain was described in the center of chest, some radiation to left scapula and arm but much worse with inspiration. This presentation was similar to prior episodes of chest discomfort and dyspnea.  In the ER he had CTA chest which was negative for PE but had diffuse bilateral pulmonary infiltrates which have been recurrent in nature.  Upon review of chest imaging he has had bilateral ground glass infiltrates that follow a bronchovascular pattern for the most part with peripheral areas of involvement possibly involving the pleura. He had CT Chests on 11/26/20, 06/26/20, 05/30/20 and 08/30/2019 all with bilateral scattered groundglass infiltrates. He has a chest radiograph dating back to 2017 with the first signs of pulmonary opacities.  The pulmonary infiltrates were initially thought to be signs of aspiration as he carries the diagnosis of nutcracker esophagus based on esophageal manometry testing in the past at [facility].    His quality of life has declined over the past years as he has not been able to be very active due to his shortness of breath.   PFTs 03/08/20 show mild restrictive defect and mild diffusion defect.   Bronchoscopy was performed on 01/14/21 with signs of blood tinged sputum but no overt abnormalities of his airways. BALs were performed in the right and left upper lobes. There was blood tinged return from the  LUL but on a second BAL it was less bloody appearing. The BAL cell differentials were neutrophil predominant.   Inflammatory workup showed positive ANA with elevated dsDNA and RNP. ESR and CRP were elevated. Infectious workup at the time was positive for Covid 19, but he was asymptomatic. Extended respiratory viral panel is negative. Cultures remain negative from the BAL samples.      He was started on solumedrol 60mg  twice daily for 2 days and then transitioned to prednisone 60mg  daily with bactrim prophylaxis.   Today he is feeling ok since discharge. He has been monitoring his oxygen levels at home and they are in the mid 90s now compared to the low 90s prior to hospitaliztion. He has been taking 60mg  of prednisone daily and daily single strength bactrim prophylaxis. He reports that his entire body hurts and complains of stiffness that will eventually get better with movement on some days but other days it is persistent. He was having explosive diarrhea episodes over the past year or so which have improved since starting his steroids. He is scheduled for a thyroid biopsy on 02/05/21 and is still awaiting a CPAP machine from the Sleep Clinic at [facility]    Past Medical History:  Diagnosis Date  . Acute nonintractable headache 09/06/2019  . Acute respiratory failure with hypoxia ([name]) 08/30/2019  . AKI (acute kidney injury) ([name]) 08/30/2019  . Antiphospholipid syndrome ([name]) 10/30/2019  . Anxiety   . APS (antiphospholipid syndrome) ([name])   . Arthritis   . Colon polyp    ? hyperplastic  . Depression   .  Diarrhea 05/21/2020  . DVT (deep venous thrombosis) ([name])   . Dyspnea on exertion 09/08/2017   "Quit smoking" 2015 with onset of symptoms in 2016  Spirometry 09/08/2017  Flat f/v loop  - d/c acei 09/08/2017  - 01/04/2020   Walked RA x two laps =  approx 573ft @ fast pace - stopped due to end of study/ min sob with sats of 93 % at the end of the study. - PFT's  03/08/20   FEV1 3.84 (83 % ) ratio  0..90  p 3 % improvement from saba p ? prior to study with DLCO  26.46 (77%) corrects to 4.76 (99%)    . Esophageal spasm   . Essential hypertension 09/23/2014   D/c acei 09/08/2017 due to pseudocopd   . GAD (generalized anxiety disorder) 08/06/2016  . Lobar pneumonia ([name]) 08/30/2019  . Lung nodule 09/21/2014  . Mixed hyperlipidemia 09/18/2015  . Morbid obesity due to excess calories ([name]) c/b hbp/ dvt/PE 09/10/2019  . 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 Nutcracker esophagus   . Paresthesia of right foot 11/08/2019  . Pulmonary infiltrates 09/10/2019   In setting of bilateral PE 08/23/2019 with antiphospholipid syndrome   . Recurrent pneumonia 07/09/2020   Formatting of this note might be different from the original. 06/26/20, 03/08/20  . Snoring 07/09/2020  . Upper airway cough syndrome 01/05/2020   Onset mid Jan 2021 while on otc PPI  - max rx for gerd 01/04/2020 >>>     . Witnessed apneic spells 07/09/2020     Family History  Problem Relation Age of Onset  . Diabetes Mother   . Hyperlipidemia Mother   . Hypertension Mother   . Thyroid disease Mother        uncertain type--had surg--no cancer  . Heart disease Father 32  . Hyperlipidemia Father   . Hypertension Father   . Prostate cancer Father 30  . Lung cancer Father 39       Dx 06/18/2017  . Colon polyps Father   . Irritable bowel syndrome Father   . Diverticulitis Father   . Diabetes Sister   . Hyperlipidemia Sister   . Hypertension Sister   . Diabetes Maternal Grandmother   . Heart disease Maternal Grandmother   . Hyperlipidemia Maternal Grandmother   . Hypertension Maternal Grandmother   . Colon cancer Maternal Grandmother   . Heart disease Maternal Grandfather   . Hyperlipidemia Maternal Grandfather   . Hypertension Maternal  Grandfather   . Stroke Maternal Grandfather   . Liver cancer Maternal Grandfather   . Irritable bowel syndrome Maternal Grandfather   . Heart disease Paternal Grandmother      Social History   Socioeconomic History  . Marital status: Married    Spouse name: Not on file  . Number of children: 3  . Years of education: Not on file  . Highest education level: Not on file  Occupational History  . Occupation: Truck Hospital doctor  Tobacco Use  . Smoking status: Former Smoker    Packs/day: 1.00    Years: 20.00    Pack years: 20.00    Types: Cigarettes    Quit date: 11/30/2013    Years since quitting: 7.1  . Smokeless tobacco: Former Neurosurgeon    Types: Snuff    Quit date:  05/21/2018  Vaping Use  . Vaping Use: Never used  Substance and Sexual Activity  . Alcohol use: Yes    Alcohol/week: 5.0 standard drinks    Types: 5 Cans of beer per week    Comment: 1 per day  . Drug use: No  . Sexual activity: Yes  Other Topics Concern  . Not on file  Social History Narrative   Married (03/2016)   Lives in [place], [place] (Originally from [place] )   Some family in [place]   Children live in [place].   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   Did play minor league baseball. ([place]   Likes to hunt, fish and plays in competitive soft ball league (slow pitch)   Interest includes trips to [employer] and collecting colorful socks.   Social Determinants of Health   Financial Resource Strain: Not on file  Food Insecurity: Not on file  Transportation Needs: Not on file  Physical Activity: Not on file  Stress: Not on file  Social Connections: Not on file  Intimate Partner Violence: Not on file     Allergies  Allergen Reactions  . Diltiazem Hcl Diarrhea and Other (See Comments)    Lethargic      Outpatient Medications Prior to Visit  Medication Sig Dispense Refill  . acetaminophen (TYLENOL) 500 MG tablet Take 1,000 mg by mouth every 6 (six) hours as needed  for mild pain.    Marland Kitchen buPROPion (WELLBUTRIN XL) 150 MG 24 hr tablet Take 1 tablet daily 90 tablet 3  . escitalopram (LEXAPRO) 20 MG tablet TAKE 1 TABLET BY MOUTH EVERY DAY (Patient taking differently: Take 20 mg by mouth daily.) 90 tablet 1  . fluticasone (FLONASE) 50 MCG/ACT nasal spray Place 1 spray into both nostrils daily as needed for allergies.    . melatonin 3 MG TABS tablet Take 1 tablet (3 mg total) by mouth at bedtime as needed. 30 tablet 0  . nitroGLYCERIN (NITROSTAT) 0.4 MG SL tablet Place 1 tablet (0.4 mg total) under the tongue every 5 (five) minutes as needed for chest pain. 15 tablet 2  . pantoprazole (PROTONIX) 40 MG tablet Take 1 tablet (40 mg total) by mouth 2 (two) times daily. 180 tablet 1  . Probiotic Product (PROBIOTIC PO) Take 1 capsule by mouth daily.    Marland Kitchen telmisartan-hydrochlorothiazide (MICARDIS HCT) 80-25 MG tablet TAKE 1 TABLET BY MOUTH EVERY DAY (Patient taking differently: Take 1 tablet by mouth daily.) 90 tablet 2  . predniSONE (DELTASONE) 20 MG tablet Take 3 tablets (60 mg total) by mouth daily. 90 tablet 0  . sulfamethoxazole-trimethoprim (BACTRIM) 400-80 MG tablet Take 1 tablet by mouth daily. 30 tablet 0  . ezetimibe (ZETIA) 10 MG tablet Take 1 tablet (10 mg total) by mouth daily. 90 tablet 3  . guaiFENesin-dextromethorphan (ROBITUSSIN DM) 100-10 MG/5ML syrup Take 5 mLs by mouth every 4 (four) hours as needed for cough. (Patient not taking: Reported on 01/30/2021) 118 mL 0  . rosuvastatin (CRESTOR) 20 MG tablet Take 1 tablet (20 mg total) by mouth daily. 90 tablet 3   No facility-administered medications prior to visit.    Review of Systems  Constitutional: Positive for malaise/fatigue. Negative for chills, fever and weight loss.  HENT: Negative for congestion, sinus pain and sore throat.   Eyes: Negative.   Respiratory: Positive for shortness of breath. Negative for cough, hemoptysis, sputum production and wheezing.   Cardiovascular: Negative for chest pain,  palpitations, orthopnea, claudication and leg swelling.  Gastrointestinal: Negative for abdominal pain, heartburn, nausea and vomiting.  Genitourinary: Negative.   Musculoskeletal: Positive for joint pain and myalgias.  Skin: Negative for rash.  Neurological: Negative for weakness.  Endo/Heme/Allergies: Negative.   Psychiatric/Behavioral: Negative.     Objective:   Vitals:   01/30/21 0928  BP: 136/86  Pulse: (!) 110  Temp: 97.6 F (36.4 C)  SpO2: 96%  Weight: 287 lb (130.2 kg)  Height: 6\' 2"  (1.88 m)     Physical Exam Constitutional:      General: He is not in acute distress.    Appearance: He is obese.  HENT:     Head: Normocephalic and atraumatic.  Eyes:     Extraocular Movements: Extraocular movements intact.     Conjunctiva/sclera: Conjunctivae normal.     Pupils: Pupils are equal, round, and reactive to light.  Cardiovascular:     Rate and Rhythm: Normal rate and regular rhythm.     Pulses: Normal pulses.     Heart sounds: Normal heart sounds. No murmur heard.   Pulmonary:     Effort: Pulmonary effort is normal.     Breath sounds: Rales (bilateral bases) present. No wheezing or rhonchi.  Abdominal:     General: Bowel sounds are normal.     Palpations: Abdomen is soft.  Musculoskeletal:     Right lower leg: No edema.     Left lower leg: No edema.  Lymphadenopathy:     Cervical: No cervical adenopathy.  Skin:    General: Skin is warm and dry.  Neurological:     General: No focal deficit present.     Mental Status: He is alert.  Psychiatric:        Mood and Affect: Mood normal.        Behavior: Behavior normal.        Thought Content: Thought content normal.        Judgment: Judgment normal.       CBC    Component Value Date/Time   WBC 11.9 (H) 01/16/2021 0420   RBC 4.74 01/16/2021 0420   HGB 14.5 01/16/2021 0420   HGB 15.0 10/21/2020 0811   HCT 44.1 01/16/2021 0420   PLT 242 01/16/2021 0420   PLT 209 10/21/2020 0811   MCV 93.0 01/16/2021  0420   MCH 30.6 01/16/2021 0420   MCHC 32.9 01/16/2021 0420   RDW 12.6 01/16/2021 0420   LYMPHSABS 1.6 10/21/2020 0811   MONOABS 0.5 10/21/2020 0811   EOSABS 0.3 10/21/2020 0811   BASOSABS 0.0 10/21/2020 0811   BMP Latest Ref Rng & Units 01/16/2021 01/15/2021 01/14/2021  Glucose 70 - 99 mg/dL 409(W) 119(J) 99  BUN 6 - 20 mg/dL 47(W) 29(F) 16  Creatinine 0.61 - 1.24 mg/dL 6.21 3.08 6.57  BUN/Creat Ratio 9 - 20 - - -  Sodium 135 - 145 mmol/L 138 136 138  Potassium 3.5 - 5.1 mmol/L 4.5 4.1 3.5  Chloride 98 - 111 mmol/L 102 101 101  CO2 22 - 32 mmol/L 27 26 27   Calcium 8.9 - 10.3 mg/dL 9.6 8.9 9.2   Labs: 8/46/9629 ANA Positive dsDNA 177 IU/mL (0-9IU/mL) Ribnucleic Protien 2.4 (0- 0.9AI)  Chest imaging: CTA Chest 01/13/21 1. No demonstrable pulmonary embolus. No thoracic aortic aneurysm or dissection. There are foci of aortic atherosclerosis as well as foci of coronary artery calcification.  2. Extensive airspace opacity bilaterally with overall increased compared to December 2021. Areas of patchy consolidation noted in several  areas. The overall appearance is most indicative of atypical organism pneumonia. A degree of bacterial superinfection is question. Note that there has been persistence of multifocal airspace opacity for several months. This circumstance may warrant Pulmonary Medicine consultation with consideration for bronchoscopy for further assessment with respect to etiology for the parenchymal lung lesions.  3. Stable adenopathy of uncertain etiology. Given the parenchymal lung changes, this adenopathy could have reactive etiology.  4. Hepatic steatosis. Liver appears prominent although incompletely visualized. Gallbladder absent.  5. Thyroid nodules with assessment by thyroid ultrasound 6 days prior. Please see recent thyroid ultrasound report with respect to thyroid nodular assessment.  PFT: PFT Results Latest Ref Rng & Units 03/08/2020  FVC-Pre L 4.31   FVC-Predicted Pre % 72  FVC-Post L 4.29  FVC-Predicted Post % 72  Pre FEV1/FVC % % 86  Post FEV1/FCV % % 90  FEV1-Pre L 3.71  FEV1-Predicted Pre % 80  FEV1-Post L 3.84  DLCO uncorrected ml/min/mmHg 26.46  DLCO UNC% % 77  DLCO corrected ml/min/mmHg 26.46  DLCO COR %Predicted % 77  DLVA Predicted % 99  TLC L 5.98  TLC % Predicted % 76  RV % Predicted % 67    Echo 07/31/20: 1. Left ventricular ejection fraction, by estimation, is 60 to 65%. The  left ventricle has normal function. The left ventricle has no regional  wall motion abnormalities. There is mild left ventricular hypertrophy.  Left ventricular diastolic parameters  were normal.  2. Right ventricular systolic function is normal. The right ventricular  size is normal.  3. Left atrial size was mildly dilated.  4. The mitral valve is normal in structure. No evidence of mitral valve  regurgitation. No evidence of mitral stenosis.  5. The aortic valve is normal in structure. Aortic valve regurgitation is  not visualized. No aortic stenosis is present.  6. The inferior vena cava is normal in size with greater than 50%  respiratory variability, suggesting right atrial pressure of 3 mmHg.  NM Myocardial Stress Test 08/01/20  The left ventricular ejection fraction is normal (55-65%).  Nuclear stress EF: 59%. There were no wall motion abnormalities  There was no ST segment deviation noted during stress.  The study is normal.  This is a low risk study. No perfusion defects.  Assessment & Plan:   ILD (interstitial lung disease) ([name]) - Plan: CK (Creatine Kinase), predniSONE (DELTASONE) 20 MG tablet, sulfamethoxazole-trimethoprim (BACTRIM) 400-80 MG tablet, mycophenolate (CELLCEPT) 500 MG tablet, Pulmonary Function Test  OSA on CPAP  Discussion: Wesley Hancock is a 50 year old male, former smoker with history of DVT/PE due to antiphospholipid syndrome diagnosed in September 2020 who presented with shortness of  breath and chest pain 01/13/21 with workup showing signs of interstitial lung disease related to lupus/mixed connective tissue overlap syndrome.   He was treated with IV solumedrol in the hospital for 2 days and then transitioned to 60mg  of prednisone daily along with bactrim prophylaxis. We will taper his steroids to 40mg  daily today and also initiate him on mycophenolate 500mg  twice daily. He is to continue bactrim prophylaxis. We will continue to taper his steroids as he tolerates the mycophenolate.   We will check pulmonary function tests and refer him to rheumatology for further evaluation.   He is to follow up in 1 month and we will monitor labs at that time.   We will plan for a HRCT chest in about 3 - 6 months.  He is awaiting a CPAP machine from the [facility] sleep clinic. I believe he will greatly benefit from this when it arrives.   Melody Comas, MD [facility] Office: [phone]   Current Outpatient Medications:  .  acetaminophen (TYLENOL) 500 MG tablet, Take 1,000 mg by mouth every 6 (six) hours as needed for mild pain., Disp: , Rfl:  .  buPROPion (WELLBUTRIN XL) 150 MG 24 hr tablet, Take 1 tablet daily, Disp: 90 tablet, Rfl: 3 .  escitalopram (LEXAPRO) 20 MG tablet, TAKE 1 TABLET BY MOUTH EVERY DAY (Patient taking differently: Take 20 mg by mouth daily.), Disp: 90 tablet, Rfl: 1 .  fluticasone (FLONASE) 50 MCG/ACT nasal spray, Place 1 spray into both nostrils daily as needed for allergies., Disp: , Rfl:  .  melatonin 3 MG TABS tablet, Take 1 tablet (3 mg total) by mouth at bedtime as needed., Disp: 30 tablet, Rfl: 0 .  mycophenolate (CELLCEPT) 500 MG tablet, Take 1 tablet (500 mg total) by mouth 2 (two) times daily., Disp: 60 tablet, Rfl: 6 .  nitroGLYCERIN (NITROSTAT) 0.4 MG SL tablet, Place 1 tablet (0.4 mg total) under the tongue every 5 (five) minutes as needed for chest pain., Disp: 15 tablet, Rfl: 2 .  pantoprazole (PROTONIX) 40 MG tablet, Take  1 tablet (40 mg total) by mouth 2 (two) times daily., Disp: 180 tablet, Rfl: 1 .  Probiotic Product (PROBIOTIC PO), Take 1 capsule by mouth daily., Disp: , Rfl:  .  telmisartan-hydrochlorothiazide (MICARDIS HCT) 80-25 MG tablet, TAKE 1 TABLET BY MOUTH EVERY DAY (Patient taking differently: Take 1 tablet by mouth daily.), Disp: 90 tablet, Rfl: 2 .  ezetimibe (ZETIA) 10 MG tablet, Take 1 tablet (10 mg total) by mouth daily., Disp: 90 tablet, Rfl: 3 .  guaiFENesin-dextromethorphan (ROBITUSSIN DM) 100-10 MG/5ML syrup, Take 5 mLs by mouth every 4 (four) hours as needed for cough. (Patient not taking: Reported on 01/30/2021), Disp: 118 mL, Rfl: 0 .  predniSONE (DELTASONE) 20 MG tablet, Take 2 tablets (40 mg total) by mouth daily., Disp: 60 tablet, Rfl: 0 .  rosuvastatin (CRESTOR) 20 MG tablet, Take 1 tablet (20 mg total) by mouth daily., Disp: 90 tablet, Rfl: 3 .  sulfamethoxazole-trimethoprim (BACTRIM) 400-80 MG tablet, Take 1 tablet by mouth daily., Disp: 90 tablet, Rfl: 0

## 2021-01-31 ENCOUNTER — Encounter: Payer: Self-pay | Admitting: Pulmonary Disease

## 2021-01-31 LAB — MISC LABCORP TEST (SEND OUT): Labcorp test code: 520085

## 2021-02-01 ENCOUNTER — Encounter (HOSPITAL_COMMUNITY): Payer: Self-pay

## 2021-02-01 ENCOUNTER — Other Ambulatory Visit: Payer: Self-pay

## 2021-02-01 ENCOUNTER — Emergency Department (HOSPITAL_COMMUNITY)
Admission: EM | Admit: 2021-02-01 | Discharge: 2021-02-01 | Disposition: A | Discharge status: Home / Self Care | Payer: BC Managed Care – PPO | Attending: Emergency Medicine | Admitting: Emergency Medicine

## 2021-02-01 ENCOUNTER — Telehealth: Payer: Self-pay | Admitting: Pulmonary Disease

## 2021-02-01 DIAGNOSIS — R7982 Elevated C-reactive protein (CRP): Secondary | ICD-10-CM | POA: Diagnosis not present

## 2021-02-01 DIAGNOSIS — M25551 Pain in right hip: Secondary | ICD-10-CM | POA: Insufficient documentation

## 2021-02-01 DIAGNOSIS — M25562 Pain in left knee: Secondary | ICD-10-CM | POA: Insufficient documentation

## 2021-02-01 DIAGNOSIS — I129 Hypertensive chronic kidney disease with stage 1 through stage 4 chronic kidney disease, or unspecified chronic kidney disease: Secondary | ICD-10-CM | POA: Diagnosis not present

## 2021-02-01 DIAGNOSIS — Z79899 Other long term (current) drug therapy: Secondary | ICD-10-CM | POA: Diagnosis not present

## 2021-02-01 DIAGNOSIS — M25561 Pain in right knee: Secondary | ICD-10-CM | POA: Diagnosis not present

## 2021-02-01 DIAGNOSIS — N182 Chronic kidney disease, stage 2 (mild): Secondary | ICD-10-CM | POA: Insufficient documentation

## 2021-02-01 DIAGNOSIS — M25552 Pain in left hip: Secondary | ICD-10-CM | POA: Diagnosis not present

## 2021-02-01 DIAGNOSIS — R700 Elevated erythrocyte sedimentation rate: Secondary | ICD-10-CM | POA: Insufficient documentation

## 2021-02-01 DIAGNOSIS — Z87891 Personal history of nicotine dependence: Secondary | ICD-10-CM | POA: Insufficient documentation

## 2021-02-01 HISTORY — DX: Reserved for concepts with insufficient information to code with codable children: IMO0002

## 2021-02-01 HISTORY — DX: Systemic lupus erythematosus, unspecified: M32.9

## 2021-02-01 LAB — BASIC METABOLIC PANEL
Anion gap: 10 (ref 5–15)
BUN: 28 mg/dL — ABNORMAL HIGH (ref 6–20)
CO2: 26 mmol/L (ref 22–32)
Calcium: 8.7 mg/dL — ABNORMAL LOW (ref 8.9–10.3)
Chloride: 96 mmol/L — ABNORMAL LOW (ref 98–111)
Creatinine, Ser: 1.44 mg/dL — ABNORMAL HIGH (ref 0.61–1.24)
GFR, Estimated: 60 mL/min — ABNORMAL LOW (ref >60)
Glucose, Bld: 98 mg/dL (ref 70–99)
Potassium: 4.1 mmol/L (ref 3.5–5.1)
Sodium: 132 mmol/L — ABNORMAL LOW (ref 135–145)

## 2021-02-01 LAB — CBC WITH DIFFERENTIAL/PLATELET
Abs Immature Granulocytes: 0.28 10*3/uL — ABNORMAL HIGH (ref 0.00–0.07)
Basophils Absolute: 0.1 10*3/uL (ref 0.0–0.1)
Basophils Relative: 1 %
Eosinophils Absolute: 0.1 10*3/uL (ref 0.0–0.5)
Eosinophils Relative: 1 %
HCT: 42.5 % (ref 39.0–52.0)
Hemoglobin: 13.9 g/dL (ref 13.0–17.0)
Immature Granulocytes: 3 %
Lymphocytes Relative: 18 %
Lymphs Abs: 1.9 10*3/uL (ref 0.7–4.0)
MCH: 30.3 pg (ref 26.0–34.0)
MCHC: 32.7 g/dL (ref 30.0–36.0)
MCV: 92.6 fL (ref 80.0–100.0)
Monocytes Absolute: 0.8 10*3/uL (ref 0.1–1.0)
Monocytes Relative: 8 %
Neutro Abs: 7.4 10*3/uL (ref 1.7–7.7)
Neutrophils Relative %: 69 %
Platelets: 208 10*3/uL (ref 150–400)
RBC: 4.59 MIL/uL (ref 4.22–5.81)
RDW: 13.6 % (ref 11.5–15.5)
WBC: 10.6 10*3/uL — ABNORMAL HIGH (ref 4.0–10.5)
nRBC: 0 % (ref 0.0–0.2)

## 2021-02-01 LAB — MAGNESIUM: Magnesium: 1.8 mg/dL (ref 1.7–2.4)

## 2021-02-01 LAB — C-REACTIVE PROTEIN: CRP: 2.6 mg/dL — ABNORMAL HIGH (ref <1.0)

## 2021-02-01 LAB — CK: Total CK: 89 U/L (ref 49–397)

## 2021-02-01 LAB — SEDIMENTATION RATE: Sed Rate: 27 mm/hr — ABNORMAL HIGH (ref 0–16)

## 2021-02-01 MED ORDER — OXYCODONE HCL 5 MG PO TABS: 5.0000 mg | ORAL_TABLET | Freq: Four times a day (QID) | ORAL | 0 refills | Status: AC | PRN

## 2021-02-01 MED ORDER — LACTATED RINGERS IV SOLN: INTRAVENOUS | Status: DC

## 2021-02-01 MED ORDER — DEXAMETHASONE SODIUM PHOSPHATE 10 MG/ML IJ SOLN
20.0000 mg | Freq: Once | INTRAMUSCULAR | Status: AC
  Administered 2021-02-01: 20 mg via INTRAVENOUS
  Filled 2021-02-01: qty 2

## 2021-02-01 MED ORDER — HYDROMORPHONE HCL 1 MG/ML IJ SOLN
1.0000 mg | Freq: Once | INTRAMUSCULAR | Status: AC
Start: 2021-02-01 — End: 2021-02-01
  Administered 2021-02-01: 1 mg via INTRAVENOUS
  Filled 2021-02-01: qty 1

## 2021-02-01 NOTE — Telephone Encounter (Signed)
Patient diagnosed with CTD related ILD on steroid taper recently 60 mg to 40 mg couple days ago. New onset excruciating leg pain bilaterally knee to thigh. L worse than right. Breathing stable. 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 Query inflammatory myopathy worsening in steroid taper vs VTE although bilateral nature argues against. Discussed resuming higher dose steroid to see if it helps. Ultimately decided to stay on prednisone 40 mg daily and go to ED for further evaluation. Would evaluate for DVT LE as well as lab work (CK, etc) for myopathy and other LE images at discretion of ED provider.

## 2021-02-01 NOTE — ED Notes (Signed)
ED Provider at bedside. 

## 2021-02-01 NOTE — ED Triage Notes (Signed)
Patient c/o bilateral hip pain that radiates into his knees. L>R. Patient also reports that he was diagnosed with Lupus last week.

## 2021-02-01 NOTE — Discharge Instructions (Addendum)
You were seen in the ER for joint and muscle pains  Inflammatory markers were slightly elevated  I think this may have been a rheumatological flare  Increase prednisone to 60 mg daily.  Take naproxen 500 mg every 8 hours with food for the next 72 hours, then take as needed for pain. Take 5 mg oxycodone for breakthrough or severe pain. Stay hydrated.   Return for worsening or new symptoms, joint swelling redness warmth, calf pain or swelling, chest pain or shortness of breath  Call and make an appointment with rheumatology

## 2021-02-01 NOTE — ED Provider Notes (Signed)
[facility] DEPT Provider Note   CSN: [id number] Arrival date & time: 02/01/21  8381     History Chief Complaint  Patient presents with  . Hip Pain    Wesley Hancock is a 50 y.o. male with pertinent past medical history of antiphospholipid syndrome, DVT and PE on Lovenox, recent hospitalization February 2022 for worsening shortness of breath ultimately diagnosed with mixed connective tissue and lupus ILD, hyperglycemia presents to the ED for evaluation of severe, constant pain in his legs.  This well, suddenly at 4 AM this morning.  Patient appears clearly uncomfortable, holding onto the bed, sweating.  Pain seems to be worse on both of his knees and it radiates up into his hips and groin.  It feels like a deep ache in his bones.  Patient called his pulmonologist and he was told that it may be related to recent prednisone taper.  He was discharged from the hospital on 60 mg of prednisone and taper down to 40 mg 2 days ago.  He denies joint swelling, redness or warmth.  No localized calf pain or swelling.  No recent increased activity or exercise, dehydration.  No extremity weakness, numbness.  No history of neuropathy.  Has been feeling well otherwise since hospital discharge.  Has never had any joint pain related to his connective tissue diagnosis.  No fevers, chills, IV drug use.  No trauma.  Tried naproxen prior to arrival without improvement.  Patient states he remembers how it felt to have DVTs and feels like this is not at all similar.  No recent falls, back pain, saddle anesthesia or changes in bowel or bladder control.  Has not yet seen a rheumatologist.  HPI    Past Medical History:  Diagnosis Date  . Acute nonintractable headache 09/06/2019  . Acute respiratory failure with hypoxia ([place]) 08/30/2019  . AKI (acute kidney injury) ([place]) 08/30/2019  . Antiphospholipid syndrome ([place]) 10/30/2019  . Anxiety   . APS (antiphospholipid syndrome) ([place])   .  Arthritis   . Colon polyp    ? hyperplastic  . Depression   . Diarrhea 05/21/2020  . DVT (deep venous thrombosis) ([place])   . Dyspnea on exertion 09/08/2017   "Quit smoking" 2015 with onset of symptoms in 2016  Spirometry 09/08/2017  Flat f/v loop  - d/c acei 09/08/2017  - 01/04/2020   Walked RA x two laps =  approx 57ft @ fast pace - stopped due to end of study/ min sob with sats of 93 % at the end of the study. - PFT's  03/08/20   FEV1 3.84 (83 % ) ratio 0..90  p 3 % improvement from saba p ? prior to study with DLCO  26.46 (77%) corrects to 4.76 (99%)    . Esophageal spasm   . Essential hypertension 09/23/2014   D/c acei 09/08/2017 due to pseudocopd   . GAD (generalized anxiety disorder) 08/06/2016  . Lobar pneumonia ([place]) 08/30/2019  . Lung nodule 09/21/2014  . Lupus ([place])   . Mixed hyperlipidemia 09/18/2015  . Morbid obesity due to excess calories ([place]) c/b hbp/ dvt/PE 09/10/2019  . 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 Nutcracker esophagus   . Paresthesia of right foot 11/08/2019  . Pulmonary infiltrates 09/10/2019   In setting of bilateral PE  08/23/2019 with antiphospholipid syndrome   . Recurrent pneumonia 07/09/2020   Formatting of this note might be different from the original. 06/26/20, 03/08/20  . Snoring 07/09/2020  . Upper airway cough syndrome 01/05/2020   Onset mid Jan 2021 while on otc PPI  - max rx for gerd 01/04/2020 >>>     . Witnessed apneic spells 07/09/2020    Patient Active Problem List   Diagnosis Date Noted  . Mixed connective tissue disease ([place]) 01/21/2021  . Multinodular goiter 01/10/2021  . OSA on CPAP 10/11/2020  . Esophageal spasm   . Colon polyp   . Arthritis   . APS (antiphospholipid syndrome) ([place])   . Anxiety   . CKD (chronic kidney disease) stage 2, GFR 60-89 ml/min  07/11/2020  . Recurrent pneumonia 07/09/2020  . Snoring 07/09/2020  . Witnessed apneic spells 07/09/2020  . Pneumonia 06/26/2020  . Diarrhea 05/21/2020  . Paresthesia of right foot 11/08/2019  . Pulmonary infiltrates 09/10/2019  . Morbid obesity due to excess calories ([place]) c/b hbp/ dvt/PE 09/10/2019  . Acute nonintractable headache 09/06/2019  . Lobar pneumonia ([place]) 08/30/2019  . Acute respiratory failure with hypoxia ([place]) 08/30/2019  . AKI (acute kidney injury) ([place]) 08/30/2019  . DVT (deep venous thrombosis) ([place]) 08/30/2019  . Multiple pulmonary emboli ([place]) 08/22/2019  . Dyspnea on exertion 09/08/2017  . Depression 06/24/2017  . GAD (generalized anxiety disorder) 08/06/2016  . Mixed hyperlipidemia 09/18/2015  . Hypertensive heart disease 09/23/2014  . Nutcracker esophagus 09/21/2014  . Lung nodule 09/21/2014    Past Surgical History:  Procedure Laterality Date  . BRONCHIAL WASHINGS  01/14/2021   Procedure: BRONCHIAL WASHINGS;  Surgeon: Freddi Starr, MD;  Location: [facility] END[facility]COPY;  Service: Pulmonary;;  . CHOLECYSTECTOMY    . VIDEO BRONCHOSCOPY N/A 01/14/2021   Procedure: VIDEO BRONCHOSCOPY WITHOUT FLUORO;  Surgeon: Freddi Starr, MD;  Location: [facility];  Service: Pulmonary;  Laterality: N/A;       Family History  Problem Relation Age of Onset  . Diabetes Mother   . Hyperlipidemia Mother   . Hypertension Mother   . Thyroid disease Mother        uncertain type--had surg--no cancer  . Heart disease Father 57  . Hyperlipidemia Father   . Hypertension Father   . Prostate cancer Father 60  . Lung cancer Father 55       Dx 06/18/2017  . Colon polyps Father   . Irritable bowel syndrome Father   . Diverticulitis Father   . Diabetes Sister   . Hyperlipidemia Sister   . Hypertension Sister   . Diabetes Maternal Grandmother   . Heart disease Maternal Grandmother   . Hyperlipidemia Maternal Grandmother   . Hypertension Maternal Grandmother   . Colon  cancer Maternal Grandmother   . Heart disease Maternal Grandfather   . Hyperlipidemia Maternal Grandfather   . Hypertension Maternal Grandfather   . Stroke Maternal Grandfather   . Liver cancer Maternal Grandfather   . Irritable bowel syndrome Maternal Grandfather   . Heart disease Paternal Grandmother     Social History   Tobacco Use  . Smoking status: Former Smoker    Packs/day: 1.00    Years: 20.00    Pack years: 20.00    Types: Cigarettes    Quit date: 11/30/2013    Years since quitting: 7.1  . Smokeless tobacco: Former Systems developer    Types: Snuff    Quit date: 05/21/2018  Vaping Use  . Vaping Use: Never used  Substance  Use Topics  . Alcohol use: Yes    Alcohol/week: 5.0 standard drinks    Types: 5 Cans of beer per week    Comment: 1 per day  . Drug use: No    Home Medications Prior to Admission medications   Medication Sig Start Date End Date Taking? Authorizing Provider  acetaminophen (TYLENOL) 500 MG tablet Take 1,000 mg by mouth every 6 (six) hours as needed for mild pain.   Yes [provider]  buPROPion (WELLBUTRIN XL) 150 MG 24 hr tablet Take 1 tablet daily 11/25/20  Yes Vivi Barrack, MD  escitalopram (LEXAPRO) 20 MG tablet TAKE 1 TABLET BY MOUTH EVERY DAY Patient taking differently: Take 20 mg by mouth daily. 12/30/20  Yes Inda Coke, PA  ezetimibe (ZETIA) 10 MG tablet Take 1 tablet (10 mg total) by mouth daily. 07/15/20 10/21/20 Yes Richardo Priest, MD  fluticasone (FLONASE) 50 MCG/ACT nasal spray Place 1 spray into both nostrils daily as needed for allergies.   Yes [provider]  guaiFENesin-dextromethorphan (ROBITUSSIN DM) 100-10 MG/5ML syrup Take 5 mLs by mouth every 4 (four) hours as needed for cough. 01/16/21  Yes Dahal, Marlowe Aschoff, MD  melatonin 3 MG TABS tablet Take 1 tablet (3 mg total) by mouth at bedtime as needed. Patient taking differently: Take 3 mg by mouth at bedtime. 01/16/21 02/15/21 Yes Dahal, Marlowe Aschoff, MD  nitroGLYCERIN (NITROSTAT)  0.4 MG SL tablet Place 1 tablet (0.4 mg total) under the tongue every 5 (five) minutes as needed for chest pain. 03/07/18  Yes Inda Coke, PA  oxyCODONE (OXY IR/ROXICODONE) 5 MG immediate release tablet Take 1 tablet (5 mg total) by mouth every 6 (six) hours as needed for up to 3 days for severe pain or breakthrough pain. 02/01/21 02/04/21 Yes Kinnie Feil, PA-C  pantoprazole (PROTONIX) 40 MG tablet Take 1 tablet (40 mg total) by mouth 2 (two) times daily. 03/08/20  Yes Parrett, Tammy S, NP  predniSONE (DELTASONE) 20 MG tablet Take 2 tablets (40 mg total) by mouth daily. 01/30/21 03/01/21 Yes Freddi Starr, MD  Probiotic Product (PROBIOTIC PO) Take 1 capsule by mouth daily.   Yes [provider]  rosuvastatin (CRESTOR) 20 MG tablet Take 1 tablet (20 mg total) by mouth daily. Patient taking differently: Take 20 mg by mouth at bedtime. 07/15/20 10/13/20 Yes Richardo Priest, MD  sulfamethoxazole-trimethoprim (BACTRIM) 400-80 MG tablet Take 1 tablet by mouth daily. 01/30/21 04/30/21 Yes Freddi Starr, MD  telmisartan-hydrochlorothiazide (MICARDIS HCT) 80-25 MG tablet TAKE 1 TABLET BY MOUTH EVERY DAY Patient taking differently: Take 1 tablet by mouth daily. 11/12/20  Yes Inda Coke, PA  mycophenolate (CELLCEPT) 500 MG tablet Take 1 tablet (500 mg total) by mouth 2 (two) times daily. Patient not taking: No sig reported 01/30/21   Freddi Starr, MD    Allergies    Diltiazem hcl  Review of Systems   Review of Systems  Musculoskeletal: Positive for arthralgias, gait problem and myalgias.  All other systems reviewed and are negative.   Physical Exam Updated Vital Signs BP (!) 148/105   Pulse (!) 106   Temp 98.7 F (37.1 C)   Resp 17   Ht 6\' 2"  (1.88 m)   Wt 130.2 kg   SpO2 93%   BMI 36.85 kg/m   Physical Exam Vitals and nursing note reviewed.  Constitutional:      General: He is in acute distress.     Appearance: He is well-developed and well-nourished.  Comments: Nontoxic but appears uncomfortable.  Grabbing onto the bed railing.  Forehead diaphoresis.  HENT:     Head: Normocephalic and atraumatic.     Right Ear: External ear normal.     Left Ear: External ear normal.     Nose: Nose normal.  Eyes:     General: No scleral icterus.    Extraocular Movements: EOM normal.     Conjunctiva/sclera: Conjunctivae normal.  Cardiovascular:     Rate and Rhythm: Normal rate and regular rhythm.     Pulses: Intact distal pulses.     Heart sounds: Normal heart sounds. No murmur heard.     Comments: 1+ DP and PT, radial pulses bilaterally.  Distal extremities warm.  No calf edema or focal tenderness. Pulmonary:     Effort: Pulmonary effort is normal.     Breath sounds: Normal breath sounds. No wheezing.  Abdominal:     Palpations: Abdomen is soft.     Tenderness: There is no abdominal tenderness.  Musculoskeletal:        General: No deformity. Normal range of motion.     Cervical back: Normal range of motion and neck supple.     Comments: Able to sit up in bed without assistance.  No midline TL spine or sacral tenderness.  No paraspinal muscular tenderness.  Full range of motion of hips, knees and ankles which exacerbates pain in his hips and knees.  No crepitus in the joints.  No focal joint edema, erythema or warmth.  Has localized tenderness over knees mostly.  Compartments soft and lower extremities.  Skin:    General: Skin is warm and dry.     Capillary Refill: Capillary refill takes less than 2 seconds.  Neurological:     Mental Status: He is alert and oriented to person, place, and time.     Comments: Sensation and strength intact in bilateral upper and lower extremities  Psychiatric:        Mood and Affect: Mood and affect normal.        Behavior: Behavior normal.        Thought Content: Thought content normal.        Judgment: Judgment normal.     ED Results / Procedures / Treatments   Labs (all labs ordered are listed, but only  abnormal results are displayed) Labs Reviewed  CBC WITH DIFFERENTIAL/PLATELET - Abnormal; Notable for the following components:      Result Value   WBC 10.6 (*)    Abs Immature Granulocytes 0.28 (*)    All other components within normal limits  BASIC METABOLIC PANEL - Abnormal; Notable for the following components:   Sodium 132 (*)    Chloride 96 (*)    BUN 28 (*)    Creatinine, Ser 1.44 (*)    Calcium 8.7 (*)    GFR, Estimated 60 (*)    All other components within normal limits  SEDIMENTATION RATE - Abnormal; Notable for the following components:   Sed Rate 27 (*)    All other components within normal limits  C-REACTIVE PROTEIN - Abnormal; Notable for the following components:   CRP 2.6 (*)    All other components within normal limits  CK  MAGNESIUM  URINALYSIS, ROUTINE W REFLEX MICROSCOPIC    EKG None  Radiology No results found.  Procedures Procedures   Medications Ordered in ED Medications  lactated ringers infusion ( Intravenous New Bag/Given 02/01/21 0853)  HYDROmorphone (DILAUDID) injection 1 mg (1 mg Intravenous Given 02/01/21  4166)  dexamethasone (DECADRON) injection 20 mg (20 mg Intravenous Given 02/01/21 0630)    ED Course  I have reviewed the triage vital signs and the nursing notes.  Pertinent labs & imaging results that were available during my care of the patient were reviewed by me and considered in my medical decision making (see chart for details).  Clinical Course as of 02/01/21 1116  Sat Feb 01, 2021  0928 WBC(!): 10.6 [CG]  0928 Sodium(!): 132 [CG]  0928 Creatinine(!): 1.44 [CG]  0928 BUN(!): 28 [CG]  0929 GFR, Estimated(!): 60 [CG]  0955 Reevaluated patient.  Talking on the phone.  Appears much more comfortable.  Reports significant improvement in pain now just feels sore. [CG]  1054 CRP(!): 2.6 [CG]  1054 Sed Rate(!): 27 [CG]  1055 Reevaluated patient.  Sitting up in bed reports significant improvement in pain.  Has been walking.  Heart rate  improved to 110s.  States this runs high usually. [CG]    Clinical Course User Index [CG] Arlean Hopping   MDM Rules/Calculators/A&P                          50 year old male with sudden onset severe arthralgias mostly in the knees up to the hips that began at 4 AM this morning.  Appears uncomfortable on exam.  Recent hospitalization and diagnosis of mixed connective tissue disease, lupus ILD on daily prednisone.  Taper down from 60 to 40 mg 2 days ago.  Supposed to start on immunosuppressant but rheumatology.  No red flags like trauma, localized swelling, redness or warmth, calf pain or swelling, chest pain or shortness of breath.  No recent falls, back pain, saddle anesthesia, extremity weakness or numbness.  EMR, triage nursing notes reviewed to assist with history and MDM  DDx: Arthralgias from lupus flare high in differential.  Exam, bilateral component makes DVT, septic arthritis less likely.  Has no weakness, saddle anesthesia, recent fall or back pain and doubt cauda equina.  No recent dehydration, increased activity or exercise, compartments are soft and compartment syndrome, rhabdo less likely.  Possible electrolyte abnormality causing cramps.  Labs, imaging ordered by me as above  Labs reveal-essentially unremarkable.  NA 132, normal K and mag.  Creatinine 1.44, BUN 28, GFR 60.  CK normal.  WBC 10.6 in setting of daily prednisone.  CRP, sed rate pending.  Imaging not thought to be indicated at this time.  Considered DVT, septic arthritis, intra-articular process like fracture, dislocation unlikely.  1115: Sed rate and CRP slightly elevated. Reevaluated patient and has had significant improvement in pain. Now ambulatory, sitting up. High suspicion for rheumatological flare. Given significant improvement, benign work-up appropriate for discharge. Presentation not consistent with septic arthritis, DVT, cauda equina or compartment syndrome. No associated abdominal pain or back  pain. Will discharge with 60 mg prednisone daily, naproxen, oxycodone for breakthrough pain. Patient has referral to establish care with rheumatology. Return precautions discussed. Discussed with PCP. Final Clinical Impression(s) / ED Diagnoses Final diagnoses:  Arthralgia of both knees    Rx / DC Orders ED Discharge Orders         Ordered    oxyCODONE (OXY IR/ROXICODONE) 5 MG immediate release tablet  Every 6 hours PRN        02/01/21 1114           Arlean Hopping 02/01/21 1116    Lacretia Leigh, MD 02/05/21 1018

## 2021-02-05 ENCOUNTER — Ambulatory Visit
Admission: RE | Admit: 2021-02-05 | Discharge: 2021-02-05 | Disposition: A | Discharge status: Home / Self Care | Payer: BC Managed Care – PPO | Source: Ambulatory Visit | Attending: Endocrinology | Admitting: Endocrinology

## 2021-02-05 ENCOUNTER — Other Ambulatory Visit (HOSPITAL_COMMUNITY)
Admission: RE | Admit: 2021-02-05 | Discharge: 2021-02-05 | Disposition: A | Discharge status: Home / Self Care | Payer: BC Managed Care – PPO | Source: Ambulatory Visit | Attending: Endocrinology | Admitting: Endocrinology

## 2021-02-05 DIAGNOSIS — E042 Nontoxic multinodular goiter: Secondary | ICD-10-CM

## 2021-02-06 LAB — CYTOLOGY - NON PAP

## 2021-02-10 ENCOUNTER — Encounter: Payer: Self-pay | Admitting: Physician Assistant

## 2021-02-10 ENCOUNTER — Ambulatory Visit: Payer: BC Managed Care – PPO | Admitting: Physician Assistant

## 2021-02-10 ENCOUNTER — Other Ambulatory Visit: Payer: Self-pay

## 2021-02-10 VITALS — BP 132/84 | HR 107 | Temp 98.2°F | Ht 74.0 in | Wt 290.5 lb

## 2021-02-10 DIAGNOSIS — J849 Interstitial pulmonary disease, unspecified: Secondary | ICD-10-CM

## 2021-02-10 DIAGNOSIS — Q383 Other congenital malformations of tongue: Secondary | ICD-10-CM | POA: Diagnosis not present

## 2021-02-10 DIAGNOSIS — M351 Other overlap syndromes: Secondary | ICD-10-CM

## 2021-02-10 MED ORDER — NYSTATIN 100000 UNIT/ML MT SUSP: 5.0000 mL | Freq: Four times a day (QID) | OROMUCOSAL | 0 refills | Status: DC

## 2021-02-10 MED ORDER — FLUCONAZOLE 150 MG PO TABS: 150.0000 mg | ORAL_TABLET | Freq: Once | ORAL | 0 refills | Status: AC

## 2021-02-10 NOTE — Patient Instructions (Signed)
It was great to see you!  Start oral diflucan tablet. Use nystatin mouthwash 1-2 times daily. If no better in one week, let me know.  I will put in referral to rheumatology with Dr. Luevenia Maxin to see if you can be be seen sooner.  Take care,  Inda Coke PA-C

## 2021-02-10 NOTE — Progress Notes (Signed)
Wesley Hancock is a 50 y.o. male here for a new problem.  I acted as a Education administrator for [employer], PA-C Anselmo Pickler, LPN   History of Present Illness:   Chief Complaint  Patient presents with  . Tongue lesion    HPI  Tongue lesion Pt is c/o lesion on tongue has been there x 4 weeks, not healing and getting larger. He bit his tongue prior to hospitalization and has had delayed healing. Tender at times. He has been using a mouth rinse BID and Orajel.  Mixed Connective Tissue Disease/ILD Related to Lupus Last seen by pulm Dr. Erin Fulling on 01/30/21. Full note available and reviewed. Inflammatory workup showed positive ANA with elevated dsDNA and RNP. ESR and CRP were elevated. Most recently he is tapering steroids to 40 mg daily and started on cellcept 500 mg BID. He was referred to rheumatology but appointment is at end of April.  Past Medical History:  Diagnosis Date  . Acute nonintractable headache 09/06/2019  . Acute respiratory failure with hypoxia ([place]) 08/30/2019  . AKI (acute kidney injury) ([place]) 08/30/2019  . Antiphospholipid syndrome ([place]) 10/30/2019  . Anxiety   . APS (antiphospholipid syndrome) ([place])   . Arthritis   . Colon polyp    ? hyperplastic  . Depression   . Diarrhea 05/21/2020  . DVT (deep venous thrombosis) ([place])   . Dyspnea on exertion 09/08/2017   "Quit smoking" 2015 with onset of symptoms in 2016  Spirometry 09/08/2017  Flat f/v loop  - d/c acei 09/08/2017  - 01/04/2020   Walked RA x two laps =  approx 522f @ fast pace - stopped due to end of study/ min sob with sats of 93 % at the end of the study. - PFT's  03/08/20   FEV1 3.84 (83 % ) ratio 0..90  p 3 % improvement from saba p ? prior to study with DLCO  26.46 (77%) corrects to 4.76 (99%)    . Esophageal spasm   . Essential hypertension 09/23/2014   D/c acei 09/08/2017 due to pseudocopd   . GAD (generalized anxiety disorder) 08/06/2016  . Lobar pneumonia ([place] 08/30/2019  . Lung nodule 09/21/2014  . Lupus  ([place]   . Mixed hyperlipidemia 09/18/2015  . Mor[place] c/b hbp/ dvt/PE 09/10/2019  . 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 Nutcracker esophagus   . Paresthesia of right foot 11/08/2019  . Pulmonary infiltrates 09/10/2019   In setting of bilateral PE 08/23/2019 with antiphospholipid syndrome   . Recurrent pneumonia 07/09/2020   Formatting of this note might be different from the original. 06/26/20, 03/08/20  . Snoring 07/09/2020  . Upper airway cough syndrome 01/05/2020   Onset mid Jan 2021 while on otc PPI  - max rx for gerd 01/04/2020 >>>     . Witnessed apneic spells 07/09/2020     Social History   Tobacco Use  . Smoking status: Former Smoker    Packs/day: 1.00    Years: 20.00    Pack years: 20.00    Types: Cigarettes    Quit date: 11/30/2013    Years since quitting: 7.2  . Smokeless tobacco: Former [employer] developer   Types: Snuff    Quit date: 05/21/2018  Vaping Use  . Vaping Use: Never used  Substance Use Topics  . Alcohol use: Yes    Alcohol/week: 5.0 standard drinks    Types: 5 Cans of beer per week    Comment: 1 per day  . Drug use: No    Past Surgical History:  Procedure Laterality Date  . BRONCHIAL WASHINGS  01/14/2021   Procedure: BRONCHIAL WASHINGS;  Surgeon: Freddi Starr, MD;  Location: [facility];  Service: Pulmonary;;  . CHOLECYSTECTOMY    . VIDEO BRONCHOSCOPY N/A 01/14/2021   Procedure: VIDEO BRONCHOSCOPY WITHOUT FLUORO;  Surgeon: Freddi Starr, MD;  Location: [facility];  Service: Pulmonary;  Laterality: N/A;    Family History  Problem Relation Age of Onset  . Diabetes Mother   . Hyperlipidemia Mother   . Hypertension Mother   . Thyroid disease Mother        uncertain type--had surg--no cancer  . Heart disease Father 16  .  Hyperlipidemia Father   . Hypertension Father   . Prostate cancer Father 19  . Lung cancer Father 70       Dx 06/18/2017  . Colon polyps Father   . Irritable bowel syndrome Father   . Diverticulitis Father   . Diabetes Sister   . Hyperlipidemia Sister   . Hypertension Sister   . Diabetes Maternal Grandmother   . Heart disease Maternal Grandmother   . Hyperlipidemia Maternal Grandmother   . Hypertension Maternal Grandmother   . Colon cancer Maternal Grandmother   . Heart disease Maternal Grandfather   . Hyperlipidemia Maternal Grandfather   . Hypertension Maternal Grandfather   . Stroke Maternal Grandfather   . Liver cancer Maternal Grandfather   . Irritable bowel syndrome Maternal Grandfather   . Heart disease Paternal Grandmother     Allergies  Allergen Reactions  . Diltiazem Hcl Diarrhea and Other (See Comments)    Lethargic     Current Medications:   Current Outpatient Medications:  .  acetaminophen (TYLENOL) 500 MG tablet, Take 1,000 mg by mouth every 6 (six) hours as needed for mild pain., Disp: , Rfl:  .  buPROPion (WELLBUTRIN XL) 150 MG 24 hr tablet, Take 1 tablet daily, Disp: 90 tablet, Rfl: 3 .  escitalopram (LEXAPRO) 20 MG tablet, TAKE 1 TABLET BY MOUTH EVERY DAY (Patient taking differently: Take 20 mg by mouth daily.), Disp: 90 tablet, Rfl: 1 .  fluconazole (DIFLUCAN) 150 MG tablet, Take 1 tablet (150 mg total) by mouth once for 1 dose., Disp: 1 tablet, Rfl: 0 .  fluticasone (FLONASE) 50 MCG/ACT nasal spray, Place 1 spray into both nostrils daily as needed for allergies., Disp: , Rfl:  .  guaiFENesin-dextromethorphan (ROBITUSSIN DM) 100-10 MG/5ML syrup, Take 5 mLs by mouth every 4 (four) hours as needed for cough., Disp: 118 mL, Rfl: 0 .  melatonin 3 MG TABS tablet, Take 1 tablet (3 mg total) by mouth at bedtime as needed. (Patient taking differently: Take 3 mg by mouth at bedtime.), Disp: 30 tablet, Rfl: 0 .  mycophenolate (CELLCEPT) 500 MG tablet, Take 1 tablet  (500 mg total) by mouth 2 (two) times daily., Disp: 60 tablet, Rfl: 6 .  nitroGLYCERIN (NITROSTAT) 0.4 MG SL tablet, Place 1 tablet (0.4 mg total) under the tongue every 5 (five) minutes as needed for chest pain., Disp: 15 tablet, Rfl: 2 .  nystatin (MYCOSTATIN) 100000 UNIT/ML suspension, Take 5 mLs (500,000 Units total) by mouth 4 (four) times daily., Disp: 60 mL, Rfl: 0 .  pantoprazole (PROTONIX) 40 MG tablet, Take 1 tablet (40 mg total)  by mouth 2 (two) times daily., Disp: 180 tablet, Rfl: 1 .  predniSONE (DELTASONE) 20 MG tablet, Take 2 tablets (40 mg total) by mouth daily., Disp: 60 tablet, Rfl: 0 .  Probiotic Product (PROBIOTIC PO), Take 1 capsule by mouth daily., Disp: , Rfl:  .  sulfamethoxazole-trimethoprim (BACTRIM) 400-80 MG tablet, Take 1 tablet by mouth daily., Disp: 90 tablet, Rfl: 0 .  telmisartan-hydrochlorothiazide (MICARDIS HCT) 80-25 MG tablet, TAKE 1 TABLET BY MOUTH EVERY DAY (Patient taking differently: Take 1 tablet by mouth daily.), Disp: 90 tablet, Rfl: 2 .  ezetimibe (ZETIA) 10 MG tablet, Take 1 tablet (10 mg total) by mouth daily., Disp: 90 tablet, Rfl: 3 .  rosuvastatin (CRESTOR) 20 MG tablet, Take 1 tablet (20 mg total) by mouth daily. (Patient taking differently: Take 20 mg by mouth at bedtime.), Disp: 90 tablet, Rfl: 3   Review of Systems:   ROS Negative unless otherwise specified per HPI.  Vitals:   Vitals:   02/10/21 1305  BP: 132/84  Pulse: (!) 107  Temp: 98.2 F (36.8 C)  TempSrc: Temporal  SpO2: 94%  Weight: 290 lb 8 oz (131.8 kg)  Height: _0  (1.88 m)     Body mass index is 37.3 kg/m.  Physical Exam:   Physical Exam Vitals and nursing note reviewed.  Constitutional:      Appearance: He is well-developed.  HENT:     Head: Normocephalic.     Mouth/Throat:     Comments: White well demarcated lesion, oval shaped, on lateral edge of R side of tongue Eyes:     Conjunctiva/sclera: Conjunctivae normal.     Pupils: Pupils are equal, round, and  reactive to light.  Pulmonary:     Effort: Pulmonary effort is normal.  Musculoskeletal:        General: Normal range of motion.     Cervical back: Normal range of motion.  Skin:    General: Skin is warm and dry.  Neurological:     Mental Status: He is alert and oriented to person, place, and time.  Psychiatric:        Behavior: Behavior normal.        Thought Content: Thought content normal.        Judgment: Judgment normal.        Assessment and Plan:   Shadrack was seen today for tongue lesion.  Diagnoses and all orders for this visit:  Mixed connective tissue disease ([place]); Interstitial lung disease ([place]) Internal referral to Dr. Luevenia Maxin. Patient agreeable to plan.  Tongue abnormality High risk for thrush. Trial oral diflucan once. Take suspension 1-2 times daily. If no improvement in 1-2 weeks, will refer to ENT.  Other orders -     fluconazole (DIFLUCAN) 150 MG tablet; Take 1 tablet (150 mg total) by mouth once for 1 dose. -     nystatin (MYCOSTATIN) 100000 UNIT/ML suspension; Take 5 mLs (500,000 Units total) by mouth 4 (four) times daily.   CMA or LPN served as scribe during this visit. History, Physical, and Plan performed by medical provider. The above documentation has been reviewed and is accurate and complete.   Inda Coke, PA-C

## 2021-02-13 LAB — FUNGUS CULTURE WITH STAIN

## 2021-02-13 LAB — FUNGAL ORGANISM REFLEX

## 2021-02-13 LAB — FUNGUS CULTURE RESULT

## 2021-02-14 LAB — FUNGUS CULTURE WITH STAIN

## 2021-02-14 LAB — FUNGUS CULTURE RESULT

## 2021-02-14 LAB — FUNGAL ORGANISM REFLEX

## 2021-02-25 ENCOUNTER — Ambulatory Visit: Payer: BC Managed Care – PPO | Admitting: Physician Assistant

## 2021-02-25 ENCOUNTER — Ambulatory Visit (HOSPITAL_COMMUNITY)
Admission: RE | Admit: 2021-02-25 | Discharge: 2021-02-25 | Disposition: A | Discharge status: Home / Self Care | Payer: BC Managed Care – PPO | Source: Ambulatory Visit | Attending: Physician Assistant | Admitting: Physician Assistant

## 2021-02-25 ENCOUNTER — Other Ambulatory Visit: Payer: Self-pay

## 2021-02-25 ENCOUNTER — Encounter: Payer: Self-pay | Admitting: Physician Assistant

## 2021-02-25 DIAGNOSIS — G4452 New daily persistent headache (NDPH): Secondary | ICD-10-CM | POA: Insufficient documentation

## 2021-02-25 DIAGNOSIS — R519 Headache, unspecified: Secondary | ICD-10-CM | POA: Diagnosis not present

## 2021-02-25 MED ORDER — IOHEXOL 300 MG/ML  SOLN
75.0000 mL | Freq: Once | INTRAMUSCULAR | Status: AC | PRN
  Administered 2021-02-25: 75 mL via INTRAVENOUS

## 2021-02-25 NOTE — Patient Instructions (Signed)
It was great to see you!  Please get head CT as scheduled.  Go ahead and reach out to [facility] about your meds if you want.  Take care,  Inda Coke PA-C

## 2021-02-25 NOTE — Progress Notes (Signed)
Wesley Hancock is a 50 y.o. male here for a new problem.   History of Present Illness:   Chief Complaint  Patient presents with  . Headache    Past 4 days, last night it got worse. Took tylenol, used oxycodone and it did not touch it each.     HPI   HA Duration of 4 days. Has been waxing and waning. Up until yesterday PM was being significantly relieved by tylenol. Last night it was untouched with tylenol and oxycodone. Describes HA as pounding, 7/10. Not worst HA of life. Took caffeine this morning and this took the edge off.  Denies: vision changes, slurred speech, confusion  He has hx of migraines and presentation is dissimilar to this.   He is currently on 40 mg prednisone daily per pulmonary and recently started on cellcept. Unsure if these are contributing to his sx. Does have significant clotting history, takes lovenox regularly.   Past Medical History:  Diagnosis Date  . Acute nonintractable headache 09/06/2019  . Acute respiratory failure with hypoxia ([place]) 08/30/2019  . AKI (acute kidney injury) ([place]) 08/30/2019  . Antiphospholipid syndrome ([place]) 10/30/2019  . Anxiety   . APS (antiphospholipid syndrome) ([place])   . Arthritis   . Colon polyp    ? hyperplastic  . Depression   . Diarrhea 05/21/2020  . DVT (deep venous thrombosis) ([place])   . Dyspnea on exertion 09/08/2017   "Quit smoking" 2015 with onset of symptoms in 2016  Spirometry 09/08/2017  Flat f/v loop  - d/c acei 09/08/2017  - 01/04/2020   Walked RA x two laps =  approx 564ft @ fast pace - stopped due to end of study/ min sob with sats of 93 % at the end of the study. - PFT's  03/08/20   FEV1 3.84 (83 % ) ratio 0..90  p 3 % improvement from saba p ? prior to study with DLCO  26.46 (77%) corrects to 4.76 (99%)    . Esophageal spasm   . Essential hypertension 09/23/2014   D/c acei 09/08/2017 due to pseudocopd   . GAD (generalized anxiety disorder) 08/06/2016  . Lobar pneumonia ([place]) 08/30/2019  . Lung nodule  09/21/2014  . Lupus ([place])   . Mixed hyperlipidemia 09/18/2015  . Morbid obesity due to excess calories ([place]) c/b hbp/ dvt/PE 09/10/2019  . 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 Nutcracker esophagus   . Paresthesia of right foot 11/08/2019  . Pulmonary infiltrates 09/10/2019   In setting of bilateral PE 08/23/2019 with antiphospholipid syndrome   . Recurrent pneumonia 07/09/2020   Formatting of this note might be different from the original. 06/26/20, 03/08/20  . Snoring 07/09/2020  . Upper airway cough syndrome 01/05/2020   Onset mid Jan 2021 while on otc PPI  - max rx for gerd 01/04/2020 >>>     . Witnessed apneic spells 07/09/2020     Social History   Tobacco Use  . Smoking status: Former Smoker    Packs/day: 1.00    Years: 20.00    Pack years: 20.00    Types: Cigarettes    Quit date: 11/30/2013    Years since quitting: 7.2  . Smokeless tobacco: Former Systems developer    Types: Snuff    Quit date: 05/21/2018  Vaping Use  . Vaping  Use: Never used  Substance Use Topics  . Alcohol use: Yes    Alcohol/week: 5.0 standard drinks    Types: 5 Cans of beer per week    Comment: 1 per day  . Drug use: No    Past Surgical History:  Procedure Laterality Date  . BRONCHIAL WASHINGS  01/14/2021   Procedure: BRONCHIAL WASHINGS;  Surgeon: Freddi Starr, MD;  Location: [facility];  Service: Pulmonary;;  . CHOLECYSTECTOMY    . VIDEO BRONCHOSCOPY N/A 01/14/2021   Procedure: VIDEO BRONCHOSCOPY WITHOUT FLUORO;  Surgeon: Freddi Starr, MD;  Location: [facility];  Service: Pulmonary;  Laterality: N/A;    Family History  Problem Relation Age of Onset  . Diabetes Mother   . Hyperlipidemia Mother   . Hypertension Mother   . Thyroid disease Mother        uncertain type--had surg--no cancer  . Heart disease  Father 66  . Hyperlipidemia Father   . Hypertension Father   . Prostate cancer Father 52  . Lung cancer Father 22       Dx 06/18/2017  . Colon polyps Father   . Irritable bowel syndrome Father   . Diverticulitis Father   . Diabetes Sister   . Hyperlipidemia Sister   . Hypertension Sister   . Diabetes Maternal Grandmother   . Heart disease Maternal Grandmother   . Hyperlipidemia Maternal Grandmother   . Hypertension Maternal Grandmother   . Colon cancer Maternal Grandmother   . Heart disease Maternal Grandfather   . Hyperlipidemia Maternal Grandfather   . Hypertension Maternal Grandfather   . Stroke Maternal Grandfather   . Liver cancer Maternal Grandfather   . Irritable bowel syndrome Maternal Grandfather   . Heart disease Paternal Grandmother     Allergies  Allergen Reactions  . Diltiazem Hcl Diarrhea and Other (See Comments)    Lethargic     Current Medications:   Current Outpatient Medications:  .  acetaminophen (TYLENOL) 500 MG tablet, Take 1,000 mg by mouth every 6 (six) hours as needed for mild pain., Disp: , Rfl:  .  buPROPion (WELLBUTRIN XL) 150 MG 24 hr tablet, Take 1 tablet daily, Disp: 90 tablet, Rfl: 3 .  enoxaparin (LOVENOX) 120 MG/0.8ML injection, Inject 120 mg into the skin in the morning and at bedtime., Disp: , Rfl:  .  escitalopram (LEXAPRO) 20 MG tablet, TAKE 1 TABLET BY MOUTH EVERY DAY (Patient taking differently: Take 20 mg by mouth daily.), Disp: 90 tablet, Rfl: 1 .  fluticasone (FLONASE) 50 MCG/ACT nasal spray, Place 1 spray into both nostrils daily as needed for allergies., Disp: , Rfl:  .  mycophenolate (CELLCEPT) 500 MG tablet, Take 1 tablet (500 mg total) by mouth 2 (two) times daily., Disp: 60 tablet, Rfl: 6 .  pantoprazole (PROTONIX) 40 MG tablet, Take 1 tablet (40 mg total) by mouth 2 (two) times daily., Disp: 180 tablet, Rfl: 1 .  predniSONE (DELTASONE) 20 MG tablet, Take 2 tablets (40 mg total) by mouth daily., Disp: 60 tablet, Rfl: 0 .   Probiotic Product (PROBIOTIC PO), Take 1 capsule by mouth daily., Disp: , Rfl:  .  telmisartan-hydrochlorothiazide (MICARDIS HCT) 80-25 MG tablet, TAKE 1 TABLET BY MOUTH EVERY DAY (Patient taking differently: Take 1 tablet by mouth daily.), Disp: 90 tablet, Rfl: 2 .  ezetimibe (ZETIA) 10 MG tablet, Take 1 tablet (10 mg total) by mouth daily., Disp: 90 tablet, Rfl: 3 .  rosuvastatin (CRESTOR) 20 MG tablet, Take 1 tablet (20 mg total) by  mouth daily. (Patient taking differently: Take 20 mg by mouth at bedtime.), Disp: 90 tablet, Rfl: 3   Review of Systems:   ROS  Negative unless otherwise specified per HPI.  Vitals:   Vitals:   02/25/21 1109  BP: 128/86  Pulse: (!) 106  Temp: (!) 97.2 F (36.2 C)  TempSrc: Temporal  SpO2: 97%  Weight: 283 lb 9.6 oz (128.6 kg)  Height: 6\' 2"  (1.88 m)     Body mass index is 36.41 kg/m.  Physical Exam:   Physical Exam Vitals and nursing note reviewed.  Constitutional:      General: He is not in acute distress.    Appearance: He is well-developed. He is not ill-appearing or toxic-appearing.  HENT:     Head: Normocephalic and atraumatic.  Cardiovascular:     Rate and Rhythm: Normal rate and regular rhythm.     Heart sounds: Normal heart sounds.  Pulmonary:     Effort: Pulmonary effort is normal. No accessory muscle usage or respiratory distress.     Breath sounds: Normal breath sounds.  Skin:    General: Skin is warm and dry.  Neurological:     General: No focal deficit present.     Mental Status: He is alert.     Cranial Nerves: Cranial nerves are intact.     Sensory: Sensation is intact.     Motor: Motor function is intact.     Coordination: Coordination is intact.     Gait: Gait is intact.  Psychiatric:        Speech: Speech normal.        Behavior: Behavior is cooperative.        Assessment and Plan:   Darivs was seen today for headache.  Diagnoses and all orders for this visit:  New daily persistent headache -     CT  HEAD W & WO CONTRAST; Future   Due to unusual HA presentation and lack of improvement from last night, as well as complex medical history, will order CT head w and w/o contrast for further evaluation and management. Worsening precautions advised. Consider discussing with prescriber (pulmonologist) about medications if concerns with side effects.   Inda Coke, PA-C

## 2021-03-01 LAB — ACID FAST CULTURE WITH REFLEXED SENSITIVITIES (MYCOBACTERIA): Acid Fast Culture: NEGATIVE

## 2021-03-03 ENCOUNTER — Other Ambulatory Visit: Payer: Self-pay | Admitting: Pulmonary Disease

## 2021-03-03 DIAGNOSIS — J849 Interstitial pulmonary disease, unspecified: Secondary | ICD-10-CM

## 2021-03-03 NOTE — Telephone Encounter (Signed)
Received a RX for prednisone 40mg  from pharmacy. Patient was last seen on 01/30/21 and was told to follow up in a month. His next OV is not scheduled until 03/25/21.   Are you ok with him having this RX? Please advise. Thanks!

## 2021-03-12 NOTE — Progress Notes (Signed)
Office Visit Note  Patient: Wesley Hancock             Date of Birth: [birth year]           MRN: [id number]             PCP: Inda Coke, PA Referring: Freddi Starr, MD Visit Date: 03/26/2021 Occupation: '@GUAROCC' @  Subjective:  Shortness of breath.   History of Present Illness: Wesley Hancock is a 50 y.o. male seen in consultation per request of Dr. Erin Fulling.  According the patient about 2 years ago he started having symptoms of shortness of breath.  At the time he was hospitalized and was diagnosed with pulmonary embolism.  He was also diagnosed with antiphospholipid syndrome.  He was under care of hematology and was treated with Lovenox.  Since then he has been having frequent shortness of breath and was diagnosed with chronic pneumonia.  He was treated with multiple antibiotics over the years.  He states over the last year he had 8 episodes of pneumonia which were treated with antibiotics.  He was finally hospitalized and was seen by Dr. Raeanne Barry who did extensive work-up and diagnosed him with interstitial lung disease.  He was started on prednisone and CellCept.  He states gradually his symptoms are improving.  He is currently on prednisone 40 mg p.o. daily and CellCept 500 mg p.o. twice daily.  He has been on the same dose for the last 2 months.  He states about 7 days ago he started having expressive aphasia and he went to the emergency room where he had extensive work-up by the stroke team and was diagnosed with stroke.  He was started on heparin and later on Coumadin.  He is on Coumadin currently.  He is also seen at pulmonary clinic for sleep apnea and is waiting on CPAP currently.  He states he is doing some dietary modifications for weight loss.  He also suffers from hypertension and hyperlipidemia.  Approximately a month ago he started having pain and discomfort in his bilateral knee joints and was having difficulty walking.  He was given some pain medication and  prednisone which have not helped.  He denies any joint pain or joint swelling currently.  Activities of Daily Living:  Patient reports morning stiffness for 20-30 minutes.   Patient Reports nocturnal pain.  Difficulty dressing/grooming: Denies Difficulty climbing stairs: Denies Difficulty getting out of chair: Denies Difficulty using hands for taps, buttons, cutlery, and/or writing: Denies  Review of Systems  Constitutional: Positive for fatigue. Negative for night sweats.  HENT: Positive for mouth sores. Negative for mouth dryness and nose dryness.        1 oral ulcer for the last 2 months.  Eyes: Negative for pain, redness, itching and dryness.  Respiratory: Positive for shortness of breath and difficulty breathing.   Cardiovascular: Negative for chest pain, palpitations, hypertension, irregular heartbeat and swelling in legs/feet.  Gastrointestinal: Positive for constipation and diarrhea. Negative for blood in stool.  Endocrine: Negative for increased urination.  Genitourinary: Negative for difficulty urinating.  Musculoskeletal: Positive for arthralgias, joint pain, myalgias, morning stiffness, muscle tenderness and myalgias. Negative for joint swelling and muscle weakness.  Skin: Negative for color change, rash, hair loss, nodules/bumps, redness, skin tightness, ulcers and sensitivity to sunlight.  Allergic/Immunologic: Negative for susceptible to infections.  Neurological: Positive for dizziness, headaches and weakness. Negative for fainting, numbness, memory loss and night sweats.  Hematological: Positive for bruising/bleeding tendency. Negative for swollen glands.  Psychiatric/Behavioral: Positive  for depressed mood and sleep disturbance. Negative for confusion. The patient is nervous/anxious.     PMFS History:  Patient Active Problem List   Diagnosis Date Noted  . CVA (cerebral vascular accident) ([place]) 03/20/2021  . Stroke ([place]) 03/20/2021  . Mixed connective tissue disease  ([place]) 01/21/2021  . Multinodular goiter 01/10/2021  . OSA on CPAP 10/11/2020  . Esophageal spasm   . Colon polyp   . Arthritis   . APS (antiphospholipid syndrome) ([place])   . Anxiety   . CKD (chronic kidney disease) stage 2, GFR 60-89 ml/min 07/11/2020  . Recurrent pneumonia 07/09/2020  . Snoring 07/09/2020  . Witnessed apneic spells 07/09/2020  . Pneumonia 06/26/2020  . Diarrhea 05/21/2020  . Paresthesia of right foot 11/08/2019  . Pulmonary infiltrates 09/10/2019  . Morbid obesity due to excess calories ([place]) c/b hbp/ dvt/PE 09/10/2019  . Acute nonintractable headache 09/06/2019  . Lobar pneumonia ([place]) 08/30/2019  . Acute respiratory failure with hypoxia ([place]) 08/30/2019  . AKI (acute kidney injury) ([place]) 08/30/2019  . DVT (deep venous thrombosis) ([place]) 08/30/2019  . Multiple pulmonary emboli ([place]) 08/22/2019  . Dyspnea on exertion 09/08/2017  . Depression 06/24/2017  . GAD (generalized anxiety disorder) 08/06/2016  . Mixed hyperlipidemia 09/18/2015  . Hypertensive heart disease 09/23/2014  . Nutcracker esophagus 09/21/2014  . Lung nodule 09/21/2014    Past Medical History:  Diagnosis Date  . Acute nonintractable headache 09/06/2019  . Acute respiratory failure with hypoxia ([place]) 08/30/2019  . AKI (acute kidney injury) ([place]) 08/30/2019  . Antiphospholipid syndrome ([place]) 10/30/2019  . Anxiety   . APS (antiphospholipid syndrome) ([place])   . Arthritis   . Colon polyp    ? hyperplastic  . Depression   . Diarrhea 05/21/2020  . DVT (deep venous thrombosis) ([place])   . Dyspnea on exertion 09/08/2017   "Quit smoking" 2015 with onset of symptoms in 2016  Spirometry 09/08/2017  Flat f/v loop  - d/c acei 09/08/2017  - 01/04/2020   Walked RA x two laps =  approx 546f @ fast pace - stopped due to end of study/ min sob with sats of 93 % at the end of the study. - PFT's  03/08/20   FEV1 3.84 (83 % ) ratio 0..90  p 3 % improvement from saba p ? prior to study with DLCO  26.46 (77%) corrects to  4.76 (99%)    . Esophageal spasm   . Essential hypertension 09/23/2014   D/c acei 09/08/2017 due to pseudocopd   . GAD (generalized anxiety disorder) 08/06/2016  . Lobar pneumonia ([place] 08/30/2019  . Lung nodule 09/21/2014  . Lupus ([place]   . Mixed hyperlipidemia 09/18/2015  . Morbid obesity due to excess calories ([place] c/b hbp/ dvt/PE 09/10/2019  . 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 Nutcracker esophagus   . Paresthesia of right foot 11/08/2019  . Pulmonary infiltrates 09/10/2019   In setting of bilateral PE 08/23/2019 with antiphospholipid syndrome   . Recurrent pneumonia 07/09/2020   Formatting of this note might be different from the original. 06/26/20, 03/08/20  . Snoring 07/09/2020  . Upper airway cough syndrome 01/05/2020   Onset mid Jan 2021 while on otc PPI  - max rx for gerd 01/04/2020 >>>     . Witnessed apneic spells 07/09/2020  Family History  Problem Relation Age of Onset  . Diabetes Mother   . Hyperlipidemia Mother   . Hypertension Mother   . Thyroid disease Mother        uncertain type--had surg--no cancer  . Heart disease Father 41  . Hyperlipidemia Father   . Hypertension Father   . Prostate cancer Father 83  . Lung cancer Father 23       Dx 06/18/2017  . Colon polyps Father   . Irritable bowel syndrome Father   . Diverticulitis Father   . Diabetes Sister   . Hyperlipidemia Sister   . Hypertension Sister   . Diabetes Maternal Grandmother   . Heart disease Maternal Grandmother   . Hyperlipidemia Maternal Grandmother   . Hypertension Maternal Grandmother   . Colon cancer Maternal Grandmother   . Heart disease Maternal Grandfather   . Hyperlipidemia Maternal Grandfather   . Hypertension Maternal Grandfather   . Stroke Maternal Grandfather   . Liver cancer Maternal  Grandfather   . Irritable bowel syndrome Maternal Grandfather   . Heart disease Paternal Grandmother   . Healthy Son   . Healthy Daughter   . Healthy Daughter    Past Surgical History:  Procedure Laterality Date  . BRONCHIAL WASHINGS  01/14/2021   Procedure: BRONCHIAL WASHINGS;  Surgeon: Freddi Starr, MD;  Location: [facility];  Service: Pulmonary;;  . CHOLECYSTECTOMY    . VIDEO BRONCHOSCOPY N/A 01/14/2021   Procedure: VIDEO BRONCHOSCOPY WITHOUT FLUORO;  Surgeon: Freddi Starr, MD;  Location: [facility];  Service: Pulmonary;  Laterality: N/A;   Social History   Social History Narrative   Married (03/2016)   Lives in [place], [place] (Originally from [place] )   Some family in [place] live in [place].   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   Did play minor league baseball. ([place])   Likes to hunt, fish and plays in competitive soft ball league (slow pitch)   Interest includes trips to [employer] and collecting colorful socks.   Immunization History  Administered Date(s) Administered  . Influenza Inj Mdck Quad Pf 12/13/2020  . Influenza,inj,Quad PF,6+ Mos 09/14/2016, 09/08/2017, 09/06/2019  . Influenza,inj,quad, With Preservative 09/18/2015  . PFIZER(Purple Top)SARS-COV-2 Vaccination 02/19/2020, 03/04/2020, 12/13/2020  . Td 11/11/2016  . Tdap 02/28/2019     Objective: Vital Signs: BP (!) 136/93 (BP Location: Right Arm, Patient Position: Sitting, Cuff Size: Large)   Pulse (!) 104   Resp 16   Ht '6\' 2"'  (1.88 m)   Wt 290 lb (131.5 kg)   BMI 37.23 kg/m    Physical Exam Vitals and nursing note reviewed.  Constitutional:      Appearance: He is well-developed.  HENT:     Head: Normocephalic and atraumatic.  Eyes:     Conjunctiva/sclera: Conjunctivae normal.     Pupils: Pupils are equal, round, and reactive to light.  Cardiovascular:     Rate and Rhythm: Normal rate and regular rhythm.     Heart sounds:  Normal heart sounds.  Pulmonary:     Effort: Pulmonary effort is normal.     Breath sounds: Normal breath sounds.  Abdominal:     General: Bowel sounds are normal.     Palpations: Abdomen is soft.  Musculoskeletal:     Cervical back: Normal range of motion and neck supple.  Skin:    General: Skin is warm and dry.     Capillary Refill: Capillary  refill takes less than 2 seconds.  Neurological:     Mental Status: He is alert and oriented to person, place, and time.  Psychiatric:        Behavior: Behavior normal.      Musculoskeletal Exam: C-spine thoracic and lumbar spine with good range of motion.  Shoulder joints, elbow joints, wrist joints, MCPs PIPs and DIPs with good range of motion with no synovitis.  Hip joints, knee joints, ankles, MTPs and PIPs with good range of motion with no synovitis.  CDAI Exam: CDAI Score: -- Patient Global: --; Provider Global: -- Swollen: --; Tender: -- Joint Exam 03/26/2021   No joint exam has been documented for this visit   There is currently no information documented on the homunculus. Go to the Rheumatology activity and complete the homunculus joint exam.  Investigation: No additional findings.  Imaging: CT ANGIO HEAD W OR WO CONTRAST  Result Date: 03/20/2021 CLINICAL DATA:  Acute neuro deficit.  Speech difficulty EXAM: CT ANGIOGRAPHY HEAD AND NECK TECHNIQUE: Multidetector CT imaging of the head and neck was performed using the standard protocol during bolus administration of intravenous contrast. Multiplanar CT image reconstructions and MIPs were obtained to evaluate the vascular anatomy. Carotid stenosis measurements (when applicable) are obtained utilizing NASCET criteria, using the distal internal carotid diameter as the denominator. CONTRAST:  8m OMNIPAQUE IOHEXOL 350 MG/ML SOLN COMPARISON:  CT head 02/25/2021.  MRI head 03/20/2021 FINDINGS: CT HEAD FINDINGS Brain: Ill-defined hypodensity in the left mid frontal lobe involving cortex  and white matter. This is consistent with acute infarct which shows restricted diffusion. No associated hemorrhage No other acute infarct, hemorrhage, mass.  Ventricle size normal Vascular: Negative for hyperdense vessel Skull: Negative Sinuses: Negative Orbits: Negative Review of the MIP images confirms the above findings CTA NECK FINDINGS Aortic arch: Bovine branching arch. Mild atherosclerotic disease proximal great vessels without stenosis Right carotid system: Right vertebral artery widely patent without stenosis. Minimal atherosclerotic disease right carotid bifurcation Left carotid system: No significant left carotid stenosis. No significant atherosclerotic disease Vertebral arteries: Both vertebral arteries widely patent to the basilar without stenosis. Skeleton: No acute skeletal abnormality. Other neck: 24 mm right thyroid nodule. No enlarged lymph nodes in the neck. Upper chest: Lung apices clear bilaterally. Review of the MIP images confirms the above findings CTA HEAD FINDINGS Anterior circulation: Cavernous carotid widely patent bilaterally. Anterior and middle cerebral arteries patent bilaterally. Left M1 segment widely patent. Decreased caliber and irregularity of 2 branches in the superior division of the left MCA likely due to thrombus with recent infarct in the left frontal lobe. This is best seen on series 17, image 25. There is distal occlusion of these vessels. Both anterior cerebral arteries widely patent. Right middle cerebral artery widely patent. Posterior circulation: Both vertebral arteries widely patent to the basilar. PICA patent bilaterally. Basilar widely patent. Superior cerebellar and posterior cerebral arteries patent without stenosis Venous sinuses: Normal venous enhancement. Anatomic variants: None Review of the MIP images confirms the above findings IMPRESSION: 1. Hypodensity left frontal lobe compatible with acute infarct as identified on MRI today. There is small caliber and  irregularity of 2 branches supplying this area involving the superior division left MCA compatible with thrombus, with distal occlusion. 2. Otherwise no intracranial stenosis 3. No extracranial stenosis in the carotid or vertebral arteries. 4. Code stroke imaging results were communicated on 03/20/2021 at 3:39 pm to provider ARory Percyvia text page 5. 24 mm right thyroid nodule. Refer to recommendations on thyroid ultrasound  01/07/2021 Electronically Signed   By: Franchot Gallo M.D.   On: 03/20/2021 15:43   CT HEAD W & WO CONTRAST  Result Date: 02/25/2021 CLINICAL DATA:  Headache for 4 days.  On blood thinners. EXAM: CT HEAD WITHOUT AND WITH CONTRAST TECHNIQUE: Contiguous axial images were obtained from the base of the skull through the vertex without and with intravenous contrast CONTRAST:  30m OMNIPAQUE IOHEXOL 300 MG/ML  SOLN COMPARISON:  Head MRI 09/06/2019 FINDINGS: Brain: There is no evidence of an acute infarct, intracranial hemorrhage, mass, midline shift, or extra-axial fluid collection. The ventricles and sulci are normal. No abnormal enhancement is identified. Vascular: The major dural venous sinuses and large intracranial arteries are enhancing. Skull: No fracture or suspicious osseous lesion. Sinuses/Orbits: Visualized paranasal sinuses and mastoid air cells are clear. Unremarkable orbits. Other: None. IMPRESSION: Negative head CT. Electronically Signed   By: ALogan BoresM.D.   On: 02/25/2021 14:34   CT ANGIO NECK W OR WO CONTRAST  Result Date: 03/20/2021 CLINICAL DATA:  Acute neuro deficit.  Speech difficulty EXAM: CT ANGIOGRAPHY HEAD AND NECK TECHNIQUE: Multidetector CT imaging of the head and neck was performed using the standard protocol during bolus administration of intravenous contrast. Multiplanar CT image reconstructions and MIPs were obtained to evaluate the vascular anatomy. Carotid stenosis measurements (when applicable) are obtained utilizing NASCET criteria, using the distal internal  carotid diameter as the denominator. CONTRAST:  757mOMNIPAQUE IOHEXOL 350 MG/ML SOLN COMPARISON:  CT head 02/25/2021.  MRI head 03/20/2021 FINDINGS: CT HEAD FINDINGS Brain: Ill-defined hypodensity in the left mid frontal lobe involving cortex and white matter. This is consistent with acute infarct which shows restricted diffusion. No associated hemorrhage No other acute infarct, hemorrhage, mass.  Ventricle size normal Vascular: Negative for hyperdense vessel Skull: Negative Sinuses: Negative Orbits: Negative Review of the MIP images confirms the above findings CTA NECK FINDINGS Aortic arch: Bovine branching arch. Mild atherosclerotic disease proximal great vessels without stenosis Right carotid system: Right vertebral artery widely patent without stenosis. Minimal atherosclerotic disease right carotid bifurcation Left carotid system: No significant left carotid stenosis. No significant atherosclerotic disease Vertebral arteries: Both vertebral arteries widely patent to the basilar without stenosis. Skeleton: No acute skeletal abnormality. Other neck: 24 mm right thyroid nodule. No enlarged lymph nodes in the neck. Upper chest: Lung apices clear bilaterally. Review of the MIP images confirms the above findings CTA HEAD FINDINGS Anterior circulation: Cavernous carotid widely patent bilaterally. Anterior and middle cerebral arteries patent bilaterally. Left M1 segment widely patent. Decreased caliber and irregularity of 2 branches in the superior division of the left MCA likely due to thrombus with recent infarct in the left frontal lobe. This is best seen on series 17, image 25. There is distal occlusion of these vessels. Both anterior cerebral arteries widely patent. Right middle cerebral artery widely patent. Posterior circulation: Both vertebral arteries widely patent to the basilar. PICA patent bilaterally. Basilar widely patent. Superior cerebellar and posterior cerebral arteries patent without stenosis Venous  sinuses: Normal venous enhancement. Anatomic variants: None Review of the MIP images confirms the above findings IMPRESSION: 1. Hypodensity left frontal lobe compatible with acute infarct as identified on MRI today. There is small caliber and irregularity of 2 branches supplying this area involving the superior division left MCA compatible with thrombus, with distal occlusion. 2. Otherwise no intracranial stenosis 3. No extracranial stenosis in the carotid or vertebral arteries. 4. Code stroke imaging results were communicated on 03/20/2021 at 3:39 pm to provider ArRory Percyia  text page 5. 24 mm right thyroid nodule. Refer to recommendations on thyroid ultrasound 01/07/2021 Electronically Signed   By: Franchot Gallo M.D.   On: 03/20/2021 15:43   MR Brain Wo Contrast (neuro protocol)  Result Date: 03/20/2021 CLINICAL DATA:  Neuro deficit, acute stroke suspected. EXAM: MRI HEAD WITHOUT CONTRAST TECHNIQUE: Multiplanar, multiecho pulse sequences of the brain and surrounding structures were obtained without intravenous contrast. COMPARISON:  CT head February 25, 2021.  MRI head September 06, 2019 FINDINGS: Brain: Acute infarct involving the left frontal cortex and operculum. Mild associated edema without mass effect. No acute hemorrhage. No hydrocephalus. Dilated perivascular spaces in the inferior basal ganglia bilaterally. No mass lesion. No midline shift. Basal cisterns are patent. No extra-axial fluid collections. Vascular: Major arterial flow voids are maintained at the skull base. Skull and upper cervical spine: Normal marrow signal. Sinuses/Orbits: Clear sinuses.  Unremarkable orbits. Other: No mastoid effusions. IMPRESSION: Acute infarct involving the left frontal cortex and operculum. Mild associated edema without mass effect. Electronically Signed   By: Margaretha Sheffield MD   On: 03/20/2021 13:43   CT CEREBRAL PERFUSION W CONTRAST  Result Date: 03/20/2021 EXAM: CT PERFUSION BRAIN TECHNIQUE: Multiphase CT imaging  of the brain was performed following IV bolus contrast injection. Subsequent parametric perfusion maps were calculated using RAPID software. CONTRAST:  36m OMNIPAQUE IOHEXOL 350 MG/ML SOLN COMPARISON:  Same day CTA and MRI FINDINGS: CT Brain Perfusion Findings: CBF (<30%) Volume: 057mPerfusion (Tmax>6.0s) volume: 15ML Mismatch Volume: 1553mnfarct Core: 0 mL Infarction Location:None identified. IMPRESSION: RAPID calculates an approximately 15 mL area of mismatch in the left frontal lobe; however, given that this area is relatively similar in size/extent to the hypodensity seen on same day CTA the penumbra is favored to be less than 15 mL and probably not significant. Findings discussed with Dr. AroRory Percy 4:47 p.m. Electronically Signed   By: FreMargaretha Sheffield   On: 03/20/2021 16:54   ECHOCARDIOGRAM COMPLETE BUBBLE STUDY  Result Date: 03/22/2021    ECHOCARDIOGRAM REPORT   Patient Name:   Wesley MCCLAFFERTYte of Exam: 03/22/2021 Medical Rec #:  [id number]        Height:       74.0 in Accession #:    [id number]       Weight:       240.0 lb Date of Birth:  [birth year]         BSA:          2.349 m Patient Age:    [age]            BP:           135/88 mmHg Patient Gender: M                   HR:           81 bpm. Exam Location:  Inpatient Procedure: 2D Echo, Cardiac Doppler, Color Doppler and Saline Contrast Bubble            Study Indications:    TIA  History:        Patient has prior history of Echocardiogram examinations. Risk                 Factors:Hypertension.  Sonographer:    JohCammy Brochureferring Phys: [id number] ARORA IMPRESSIONS  1. Left ventricular ejection fraction, by estimation, is 55 to 60%. The left ventricle has normal function. The  left ventricle has no regional wall motion abnormalities. There is mild left ventricular hypertrophy. Left ventricular diastolic parameters are indeterminate.  2. Right ventricular systolic function is normal. The right ventricular size is  normal. Tricuspid regurgitation signal is inadequate for assessing PA pressure.  3. The mitral valve is normal in structure. No evidence of mitral valve regurgitation.  4. The aortic valve is tricuspid. Aortic valve regurgitation is not visualized. No aortic stenosis is present.  5. The inferior vena cava is normal in size with greater than 50% respiratory variability, suggesting right atrial pressure of 3 mmHg.  6. Agitated saline contrast bubble study was negative, with no evidence of any interatrial shunt. FINDINGS  Left Ventricle: Left ventricular ejection fraction, by estimation, is 55 to 60%. The left ventricle has normal function. The left ventricle has no regional wall motion abnormalities. The left ventricular internal cavity size was normal in size. There is  mild left ventricular hypertrophy. Left ventricular diastolic parameters are indeterminate. Right Ventricle: The right ventricular size is normal. No increase in right ventricular wall thickness. Right ventricular systolic function is normal. Tricuspid regurgitation signal is inadequate for assessing PA pressure. Left Atrium: Left atrial size was normal in size. Right Atrium: Right atrial size was normal in size. Pericardium: There is no evidence of pericardial effusion. Mitral Valve: The mitral valve is normal in structure. No evidence of mitral valve regurgitation. Tricuspid Valve: The tricuspid valve is normal in structure. Tricuspid valve regurgitation is not demonstrated. Aortic Valve: The aortic valve is tricuspid. Aortic valve regurgitation is not visualized. No aortic stenosis is present. Aortic valve mean gradient measures 6.0 mmHg. Aortic valve peak gradient measures 11.7 mmHg. Aortic valve area, by VTI measures 3.08  cm. Pulmonic Valve: The pulmonic valve was grossly normal. Pulmonic valve regurgitation is not visualized. Aorta: The aortic root and ascending aorta are structurally normal, with no evidence of dilitation. Venous: The  inferior vena cava is normal in size with greater than 50% respiratory variability, suggesting right atrial pressure of 3 mmHg. IAS/Shunts: No atrial level shunt detected by color flow Doppler. Agitated saline contrast was given intravenously to evaluate for intracardiac shunting. Agitated saline contrast bubble study was negative, with no evidence of any interatrial shunt.  LEFT VENTRICLE PLAX 2D LVIDd:         4.40 cm  Diastology LVIDs:         3.10 cm  LV e' medial:    6.96 cm/s LV PW:         1.20 cm  LV E/e' medial:  10.5 LV IVS:        1.30 cm  LV e' lateral:   8.16 cm/s LVOT diam:     2.10 cm  LV E/e' lateral: 8.9 LV SV:         85 LV SV Index:   36 LVOT Area:     3.46 cm  RIGHT VENTRICLE             IVC RV Basal diam:  3.30 cm     IVC diam: 1.40 cm RV S prime:     19.90 cm/s TAPSE (M-mode): 3.1 cm LEFT ATRIUM             Index       RIGHT ATRIUM           Index LA diam:        3.60 cm 1.53 cm/m  RA Area:     15.00 cm LA Vol (A2C):   57.8  ml 24.61 ml/m RA Volume:   38.10 ml  16.22 ml/m LA Vol (A4C):   56.3 ml 23.97 ml/m LA Biplane Vol: 59.6 ml 25.38 ml/m  AORTIC VALVE AV Area (Vmax):    2.96 cm AV Area (Vmean):   3.12 cm AV Area (VTI):     3.08 cm AV Vmax:           171.00 cm/s AV Vmean:          120.000 cm/s AV VTI:            0.277 m AV Peak Grad:      11.7 mmHg AV Mean Grad:      6.0 mmHg LVOT Vmax:         146.00 cm/s LVOT Vmean:        108.000 cm/s LVOT VTI:          0.246 m LVOT/AV VTI ratio: 0.89  AORTA Ao Root diam: 3.30 cm Ao Asc diam:  3.30 cm MITRAL VALVE MV Area (PHT): 3.19 cm    SHUNTS MV Decel Time: 238 msec    Systemic VTI:  0.25 m MV E velocity: 72.80 cm/s  Systemic Diam: 2.10 cm MV A velocity: 70.70 cm/s MV E/A ratio:  1.03 Oswaldo Milian MD Electronically signed by Oswaldo Milian MD Signature Date/Time: 03/22/2021/1:42:32 PM    Final     Recent Labs: Lab Results  Component Value Date   WBC 15.8 (H) 03/24/2021   HGB 15.5 03/24/2021   PLT 267 03/24/2021   NA 139  03/23/2021   K 4.7 03/23/2021   CL 105 03/23/2021   CO2 24 03/23/2021   GLUCOSE 107 (H) 03/23/2021   BUN 16 03/23/2021   CREATININE 1.24 03/23/2021   BILITOT 0.9 03/20/2021   ALKPHOS 48 03/20/2021   AST 27 03/20/2021   ALT 42 03/20/2021   PROT 8.4 (H) 03/20/2021   ALBUMIN 4.5 03/20/2021   CALCIUM 9.2 03/23/2021   GFRAA 84 10/14/2020   QFTBGOLDPLUS Negative 01/15/2021   03/ 14 2022 MPO and PR-3 antibodies negative.  P-ANCA negative, c-ANCA negative  February 01, 2021 CK 89  March 20, 2021 Jo1 negative, UA negative, ESR 29  October 21, 2020 anticardiolipin IgG 67, lupus anticoagulant positive  July 18, 2020 hepatitis B-, hepatitis C negative June 27, 2020 HIV negative January 15, 2021 TB Gold negative    Speciality Comments: No specialty comments available.  Procedures:  No procedures performed Allergies: Diltiazem hcl   Assessment / Plan:     Visit Diagnoses: Mixed connective tissue disease ([place]) - 02/01/21: CK 89, Mg 1.8, ESR 27, CRP 2.62/14/22: ANA+, ESR 40, CRP 2.5, dsDNA 117, ribonucleic protein 2.4, Sm-, Scl-70-, Ro-, La-, chromatin ab-, Anti-Jo-1-.  Patient has positive ANA, positive double-stranded ENA, positive RNP, positive lupus anticoagulant positive, anticardiolipin antibodies.  There is history of DVTs, pulmonary embolism, ILD, arthralgias.  He has 1 oral ulcer for the last 2 months.  There is no history of nasal ulcers, sicca symptoms, Raynaud's phenomenon, malar rash, photosensitivity, lymphadenopathy.  There is no family history of autoimmune disease.  I will obtain AVISE labs.  ILD (interstitial lung disease) ([name])-he was recently diagnosed with ILD.  He has had multiple CT scans.  On review of some of the CT scan findings he had bilateral groundglass infiltrates in the peripheral areas.  PFTs also showed mild diffusion defect on March 08, 2020.  He had a bronchoscopy on January 14, 2021.  He was recently diagnosed with ILD by Dr. Erin Fulling.  He was placed on  prednisone taper starting at 60 mg.  He is currently on 40 mg and will be reducing prednisone to 30 mg.  He is currently on CellCept 500 mg p.o. twice daily along with Bactrim prophylaxis.  I am in agreement to increase the dose of CellCept to 1 g p.o. twice daily.  APS (antiphospholipid syndrome) ([name])-he has been followed by hematology.  He is currently on Coumadin and is going to Coumadin clinic for adjustment of INR.  In my opinion he would benefit from adding hydroxychloroquine.  Detailed counseling was provided.  Handout was given and consent was taken.  He will be started on hydroxychloroquine 200 mg p.o. twice daily.  He will need baseline eye examination and then eye examination on yearly basis.  Patient was counseled on the purpose, proper use, and adverse effects of hydroxychloroquine including nausea/diarrhea, skin rash, headaches, and sun sensitivity.  Discussed importance of annual eye exams while on hydroxychloroquine to monitor to ocular toxicity and discussed importance of frequent laboratory monitoring.  Provided patient with eye exam form for baseline ophthalmologic exam.  Provided patient with educational materials on hydroxychloroquine and answered all questions.  Patient consented to hydroxychloroquine.  Will upload consent in the media tab.  Plaquenil also has blood thinning effect.  He has been advised to notify warfarin clinic about addition of Plaquenil.   Multiple pulmonary emboli ([name]) - 07/2018  Deep vein thrombosis (DVT) of right lower extremity, unspecified chronicity, unspecified vein ([place])  Cerebrovascular accident, unspecified origin-patient was diagnosed with stroke about a week ago when he developed expressive aphasia.  He had extensive work-up by the stroke team.  He was on heparin followed by Lovenox and is transitioning to Coumadin.  Hypertensive heart disease without heart failure-his blood pressure is mildly elevated today.  Dietary modifications were  discussed.  Mixed hyperlipidemia-weight loss diet and exercise was emphasized.  Pulmonary infiltrates  OSA on CPAP-he is followed by pulmonologist.  Nutcracker esophagus  Esophageal spasm  Multinodular goiter  GAD (generalized anxiety disorder)  Fatigue-gives history of increased fatigue which could be related to ILD.  Orders: Orders Placed This Encounter  Procedures  . TSH  . Serum protein electrophoresis with reflex  . IgG, IgA, IgM  . Glucose 6 phosphate dehydrogenase   Meds ordered this encounter  Medications  . hydroxychloroquine (PLAQUENIL) 200 MG tablet    Sig: Take 1 tablet (200 mg total) by mouth 2 (two) times daily.    Dispense:  180 tablet    Refill:  0   Face-to-face time spent with patient was over 60 minutes.  50% time was in counseling and coordination of care.   Follow-Up Instructions: Return for MCTD, ILD, APS.   Bo Merino, MD  Note - This record has been created using Editor, commissioning.  Chart creation errors have been sought, but may not always  have been located. Such creation errors do not reflect on  the standard of medical care.

## 2021-03-17 LAB — ACID FAST CULTURE WITH REFLEXED SENSITIVITIES (MYCOBACTERIA): Acid Fast Culture: NEGATIVE

## 2021-03-20 ENCOUNTER — Inpatient Hospital Stay (HOSPITAL_COMMUNITY): Payer: BC Managed Care – PPO

## 2021-03-20 ENCOUNTER — Other Ambulatory Visit: Payer: Self-pay

## 2021-03-20 ENCOUNTER — Emergency Department (HOSPITAL_COMMUNITY): Payer: BC Managed Care – PPO

## 2021-03-20 ENCOUNTER — Encounter (HOSPITAL_COMMUNITY): Payer: Self-pay | Admitting: Emergency Medicine

## 2021-03-20 ENCOUNTER — Inpatient Hospital Stay (HOSPITAL_COMMUNITY)
Admission: EM | Admit: 2021-03-20 | Discharge: 2021-03-24 | DRG: 065 | Disposition: A | Discharge status: Home / Self Care | Payer: BC Managed Care – PPO | Attending: Internal Medicine | Admitting: Internal Medicine

## 2021-03-20 DIAGNOSIS — I119 Hypertensive heart disease without heart failure: Secondary | ICD-10-CM | POA: Diagnosis not present

## 2021-03-20 DIAGNOSIS — R000 Tachycardia, unspecified: Secondary | ICD-10-CM | POA: Diagnosis not present

## 2021-03-20 DIAGNOSIS — F411 Generalized anxiety disorder: Secondary | ICD-10-CM | POA: Diagnosis present

## 2021-03-20 DIAGNOSIS — Z8616 Personal history of COVID-19: Secondary | ICD-10-CM | POA: Diagnosis not present

## 2021-03-20 DIAGNOSIS — F32A Depression, unspecified: Secondary | ICD-10-CM | POA: Diagnosis not present

## 2021-03-20 DIAGNOSIS — M329 Systemic lupus erythematosus, unspecified: Secondary | ICD-10-CM | POA: Diagnosis present

## 2021-03-20 DIAGNOSIS — D72828 Other elevated white blood cell count: Secondary | ICD-10-CM | POA: Diagnosis present

## 2021-03-20 DIAGNOSIS — R4701 Aphasia: Secondary | ICD-10-CM | POA: Diagnosis present

## 2021-03-20 DIAGNOSIS — G4733 Obstructive sleep apnea (adult) (pediatric): Secondary | ICD-10-CM | POA: Diagnosis present

## 2021-03-20 DIAGNOSIS — R29701 NIHSS score 1: Secondary | ICD-10-CM | POA: Diagnosis not present

## 2021-03-20 DIAGNOSIS — I63312 Cerebral infarction due to thrombosis of left middle cerebral artery: Principal | ICD-10-CM | POA: Diagnosis present

## 2021-03-20 DIAGNOSIS — N182 Chronic kidney disease, stage 2 (mild): Secondary | ICD-10-CM | POA: Diagnosis not present

## 2021-03-20 DIAGNOSIS — E782 Mixed hyperlipidemia: Secondary | ICD-10-CM | POA: Diagnosis not present

## 2021-03-20 DIAGNOSIS — Z87891 Personal history of nicotine dependence: Secondary | ICD-10-CM

## 2021-03-20 DIAGNOSIS — Z86718 Personal history of other venous thrombosis and embolism: Secondary | ICD-10-CM

## 2021-03-20 DIAGNOSIS — M351 Other overlap syndromes: Secondary | ICD-10-CM | POA: Diagnosis present

## 2021-03-20 DIAGNOSIS — E041 Nontoxic single thyroid nodule: Secondary | ICD-10-CM | POA: Diagnosis not present

## 2021-03-20 DIAGNOSIS — I6521 Occlusion and stenosis of right carotid artery: Secondary | ICD-10-CM | POA: Diagnosis not present

## 2021-03-20 DIAGNOSIS — R4702 Dysphasia: Secondary | ICD-10-CM | POA: Diagnosis present

## 2021-03-20 DIAGNOSIS — Z79899 Other long term (current) drug therapy: Secondary | ICD-10-CM

## 2021-03-20 DIAGNOSIS — D6861 Antiphospholipid syndrome: Secondary | ICD-10-CM | POA: Diagnosis present

## 2021-03-20 DIAGNOSIS — Z7901 Long term (current) use of anticoagulants: Secondary | ICD-10-CM

## 2021-03-20 DIAGNOSIS — Z86711 Personal history of pulmonary embolism: Secondary | ICD-10-CM

## 2021-03-20 DIAGNOSIS — Z823 Family history of stroke: Secondary | ICD-10-CM

## 2021-03-20 DIAGNOSIS — Z7952 Long term (current) use of systemic steroids: Secondary | ICD-10-CM

## 2021-03-20 DIAGNOSIS — I6602 Occlusion and stenosis of left middle cerebral artery: Secondary | ICD-10-CM | POA: Diagnosis not present

## 2021-03-20 DIAGNOSIS — I63412 Cerebral infarction due to embolism of left middle cerebral artery: Secondary | ICD-10-CM

## 2021-03-20 DIAGNOSIS — I1310 Hypertensive heart and chronic kidney disease without heart failure, with stage 1 through stage 4 chronic kidney disease, or unspecified chronic kidney disease: Secondary | ICD-10-CM | POA: Diagnosis present

## 2021-03-20 DIAGNOSIS — I639 Cerebral infarction, unspecified: Secondary | ICD-10-CM | POA: Diagnosis not present

## 2021-03-20 DIAGNOSIS — R479 Unspecified speech disturbances: Secondary | ICD-10-CM | POA: Diagnosis not present

## 2021-03-20 DIAGNOSIS — I6389 Other cerebral infarction: Secondary | ICD-10-CM | POA: Diagnosis not present

## 2021-03-20 DIAGNOSIS — R29818 Other symptoms and signs involving the nervous system: Secondary | ICD-10-CM | POA: Diagnosis not present

## 2021-03-20 LAB — COMPREHENSIVE METABOLIC PANEL
ALT: 42 U/L (ref 0–44)
AST: 27 U/L (ref 15–41)
Albumin: 4.5 g/dL (ref 3.5–5.0)
Alkaline Phosphatase: 48 U/L (ref 38–126)
Anion gap: 10 (ref 5–15)
BUN: 28 mg/dL — ABNORMAL HIGH (ref 6–20)
CO2: 26 mmol/L (ref 22–32)
Calcium: 9.7 mg/dL (ref 8.9–10.3)
Chloride: 99 mmol/L (ref 98–111)
Creatinine, Ser: 1.17 mg/dL (ref 0.61–1.24)
GFR, Estimated: >60 mL/min (ref >60)
Glucose, Bld: 156 mg/dL — ABNORMAL HIGH (ref 70–99)
Potassium: 4.2 mmol/L (ref 3.5–5.1)
Sodium: 135 mmol/L (ref 135–145)
Total Bilirubin: 0.9 mg/dL (ref 0.3–1.2)
Total Protein: 8.4 g/dL — ABNORMAL HIGH (ref 6.5–8.1)

## 2021-03-20 LAB — CBC WITH DIFFERENTIAL/PLATELET
Abs Immature Granulocytes: 0.18 10*3/uL — ABNORMAL HIGH (ref 0.00–0.07)
Basophils Absolute: 0 10*3/uL (ref 0.0–0.1)
Basophils Relative: 0 %
Eosinophils Absolute: 0 10*3/uL (ref 0.0–0.5)
Eosinophils Relative: 0 %
HCT: 46.9 % (ref 39.0–52.0)
Hemoglobin: 15.4 g/dL (ref 13.0–17.0)
Immature Granulocytes: 2 %
Lymphocytes Relative: 5 %
Lymphs Abs: 0.6 10*3/uL — ABNORMAL LOW (ref 0.7–4.0)
MCH: 30.4 pg (ref 26.0–34.0)
MCHC: 32.8 g/dL (ref 30.0–36.0)
MCV: 92.5 fL (ref 80.0–100.0)
Monocytes Absolute: 0.2 10*3/uL (ref 0.1–1.0)
Monocytes Relative: 2 %
Neutro Abs: 9.2 10*3/uL — ABNORMAL HIGH (ref 1.7–7.7)
Neutrophils Relative %: 91 %
Platelets: 286 10*3/uL (ref 150–400)
RBC: 5.07 MIL/uL (ref 4.22–5.81)
RDW: 13.9 % (ref 11.5–15.5)
WBC: 10.1 10*3/uL (ref 4.0–10.5)
nRBC: 0 % (ref 0.0–0.2)

## 2021-03-20 LAB — PROTIME-INR
INR: 0.9 (ref 0.8–1.2)
Prothrombin Time: 12.3 seconds (ref 11.4–15.2)

## 2021-03-20 LAB — C-REACTIVE PROTEIN: CRP: 0.8 mg/dL (ref <1.0)

## 2021-03-20 LAB — APTT: aPTT: 44 seconds — ABNORMAL HIGH (ref 24–36)

## 2021-03-20 LAB — RESP PANEL BY RT-PCR (FLU A&B, COVID) ARPGX2
Influenza A by PCR: NEGATIVE
Influenza B by PCR: NEGATIVE
SARS Coronavirus 2 by RT PCR: NEGATIVE

## 2021-03-20 LAB — SEDIMENTATION RATE: Sed Rate: 29 mm/hr — ABNORMAL HIGH (ref 0–16)

## 2021-03-20 LAB — ETHANOL: Alcohol, Ethyl (B): <10 mg/dL (ref <10)

## 2021-03-20 MED ORDER — STROKE: EARLY STAGES OF RECOVERY BOOK
Freq: Once | Status: AC
  Filled 2021-03-20: qty 1

## 2021-03-20 MED ORDER — HEPARIN (PORCINE) 25000 UT/250ML-% IV SOLN
1700.0000 [IU]/h | INTRAVENOUS | Status: DC
  Administered 2021-03-20: 1300 [IU]/h via INTRAVENOUS
  Administered 2021-03-22 – 2021-03-24 (×3): 1700 [IU]/h via INTRAVENOUS
  Filled 2021-03-20 (×6): qty 250

## 2021-03-20 MED ORDER — IOHEXOL 350 MG/ML SOLN
40.0000 mL | Freq: Once | INTRAVENOUS | Status: AC | PRN
  Administered 2021-03-20: 40 mL via INTRAVENOUS

## 2021-03-20 MED ORDER — IOHEXOL 350 MG/ML SOLN
75.0000 mL | Freq: Once | INTRAVENOUS | Status: AC | PRN
  Administered 2021-03-20: 75 mL via INTRAVENOUS

## 2021-03-20 MED ORDER — ASPIRIN 300 MG RE SUPP: 300.0000 mg | Freq: Every day | RECTAL | Status: DC

## 2021-03-20 MED ORDER — ASPIRIN 325 MG PO TABS
325.0000 mg | ORAL_TABLET | Freq: Every day | ORAL | Status: DC
  Administered 2021-03-20: 325 mg via ORAL
  Filled 2021-03-20: qty 1

## 2021-03-20 NOTE — Progress Notes (Addendum)
ON CALL NEUROLOGIST NOTE Called by Dr. Odette Hancock from the [facility] emergency room around 12:30 PM regarding recommendations on imaging modality for patient with mild word finding difficulty and no other deficits.  MRI brain recommended-reviewed.  Shows left MCA territory infarct.  Recommended admission to the hospitalist and also getting a stat CTA head and neck.  CTA head and neck was read as showing small caliber and irregularity of the superior division MCA is with distal occlusion.  CT head showed hypodensity in the same area as the MRI. I have ordered a CT perfusion study and if there is a huge penumbra, I will evaluate the patient as a telemedicine stroke and assess for thrombectomy. NIH stroke scale reported to be 1 for mild aphasia, confirmed by EDP and Admitting Hospitalist. Too low to treat for EVT but aphasia could be disabling and if has a large penumbra to save, might be worthwhile to attempt thrombectomy. I have recommended that he be started on a heparin drip given his antiphospholipid antibody syndrome-stroke protocol-no bolus. OK for ASA as well. Stat CTP - which we will follow. Discussed my plan with Dr. Shanon Hancock, who is the admitting hospitalist. Transfer asap to Step Down bed at [facility]. I will evaluate the CT perfusion and update recommendations.   -- Wesley Portland, MD Neurologist [facility] Pager: [phone]  ADDENDUM 1700 hrs CTP with 15cc penumbra - likely overestimated given already present hypodensity in that area on [place]. Allow permissive HTN to 180 (not 220) given he is on heparin gtt. Stroke work up per in house neurology team to include Echo, A1c, Lipid panel, PT OT ST eval. Discussed imaging with neurorads, and relayed plan to admitting Dr. Shanon Hancock over secure chat with updates on the plan after CTP as above. Dr Wesley Hancock at [facility] aware of the patient   -- Wesley Portland, MD Neurologist [facility] Pager: [phone]

## 2021-03-20 NOTE — Consult Note (Signed)
NEURO HOSPITALIST CONSULT NOTE   Requestig physician: [name]  Reason for Consult: Acute left frontal operculum ischemic infarction  History obtained from:  Patient and Chart     HPI:                                                                                                                                          Wesley Hancock is an 50 y.o. male with a PMHx of antiphospholipid syndrome, DVT, CKD, HTN, lupus, HLD, obesity, untreated OSA (waiting to be started on home CPAP) and PE (on chronic Lovenox) who presented to the ED this morning with expressive dysphasia. He had "trouble getting words out" on awakening at 7 AM. LKN was 11 PM last night. MRI brain revealed an acute left frontal operculum ischemic infarction. The patient denied any other neurological symptoms, including no headache, limb weakness, numbness, paresthesia or facial droop. NIHSS of 1 at initial ED assessment. STAT CTA of head and neck revealed small caliber and irregularity of the superior division MCA is with distal occlusion. CTP revealed no significant penumbra outside of the zone of the infarction which was seen on MRI. The patient was transferred to [facility] for stroke management.    Past Medical History:  Diagnosis Date  . Acute nonintractable headache 09/06/2019  . Acute respiratory failure with hypoxia ([place]) 08/30/2019  . AKI (acute kidney injury) ([place]) 08/30/2019  . Antiphospholipid syndrome ([place]) 10/30/2019  . Anxiety   . APS (antiphospholipid syndrome) ([place])   . Arthritis   . Colon polyp    ? hyperplastic  . Depression   . Diarrhea 05/21/2020  . DVT (deep venous thrombosis) ([place])   . Dyspnea on exertion 09/08/2017   "Quit smoking" 2015 with onset of symptoms in 2016  Spirometry 09/08/2017  Flat f/v loop  - d/c acei 09/08/2017  - 01/04/2020   Walked RA x two laps =  approx 576f @ fast pace - stopped due to end of study/ min sob with sats of 93 % at the end of the study. - PFT's  03/08/20    FEV1 3.84 (83 % ) ratio 0..90  p 3 % improvement from saba p ? prior to study with DLCO  26.46 (77%) corrects to 4.76 (99%)    . Esophageal spasm   . Essential hypertension 09/23/2014   D/c acei 09/08/2017 due to pseudocopd   . GAD (generalized anxiety disorder) 08/06/2016  . Lobar pneumonia ([place] 08/30/2019  . Lung nodule 09/21/2014  . Lupus ([place]   . Mixed hyperlipidemia 09/18/2015  . Morbid obesity due to excess calories ([place] c/b hbp/ dvt/PE 09/10/2019  . 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 [place]cracker esophagus   . Paresthesia of right foot 11/08/2019  . Pulmonary infiltrates 09/10/2019   In setting of bilateral PE 08/23/2019 with antiphospholipid syndrome   . Recurrent pneumonia 07/09/2020   Formatting of this note might be different from the original. 06/26/20, 03/08/20  . Snoring 07/09/2020  . Upper airway cough syndrome 01/05/2020   Onset mid Jan 2021 while on otc PPI  - max rx for gerd 01/04/2020 >>>     . Witnessed apneic spells 07/09/2020    Past Surgical History:  Procedure Laterality Date  . BRONCHIAL WASHINGS  01/14/2021   Procedure: BRONCHIAL WASHINGS;  Surgeon: Freddi Starr, MD;  Location: [facility] ENDOSCOPY;  Service: Pulmonary;;  . CHOLECYSTECTOMY    . VIDEO BRONCHOSCOPY N/A 01/14/2021   Procedure: VIDEO BRONCHOSCOPY WITHOUT FLUORO;  Surgeon: Freddi Starr, MD;  Location: [facility];  Service: Pulmonary;  Laterality: N/A;    Family History  Problem Relation Age of Onset  . Diabetes Mother   . Hyperlipidemia Mother   . Hypertension Mother   . Thyroid disease Mother        uncertain type--had surg--no cancer  . Heart disease Father 53  . Hyperlipidemia Father   . Hypertension Father   . Prostate cancer Father 61  . Lung cancer Father 81       Dx 06/18/2017  . Colon polyps  Father   . Irritable bowel syndrome Father   . Diverticulitis Father   . Diabetes Sister   . Hyperlipidemia Sister   . Hypertension Sister   . Diabetes Maternal Grandmother   . Heart disease Maternal Grandmother   . Hyperlipidemia Maternal Grandmother   . Hypertension Maternal Grandmother   . Colon cancer Maternal Grandmother   . Heart disease Maternal Grandfather   . Hyperlipidemia Maternal Grandfather   . Hypertension Maternal Grandfather   . Stroke Maternal Grandfather   . Liver cancer Maternal Grandfather   . Irritable bowel syndrome Maternal Grandfather   . Heart disease Paternal Grandmother              Social History:  reports that he quit smoking about 7 years ago. His smoking use included cigarettes. He has a 20.00 pack-year smoking history. He quit smokeless tobacco use about 2 years ago.  His smokeless tobacco use included snuff. He reports current alcohol use of about 5.0 standard drinks of alcohol per week. He reports that he does not use drugs.  Allergies  Allergen Reactions  . Diltiazem Hcl Diarrhea and Other (See Comments)    Lethargic     MEDICATIONS:                                                                                                                     Prior to Admission:  Medications Prior to Admission  Medication Sig Dispense Refill Last Dose  . acetaminophen (TYLENOL) 500 MG tablet Take 1,000 mg by mouth every 6 (  six) hours as needed for mild pain.   03/19/2021 at Unknown time  . buPROPion (WELLBUTRIN XL) 150 MG 24 hr tablet Take 1 tablet daily (Patient taking differently: Take 150 mg by mouth daily.) 90 tablet 3 03/20/2021 at Unknown time  . enoxaparin (LOVENOX) 120 MG/0.8ML injection Inject 120 mg into the skin in the morning and at bedtime.   03/20/2021 at [place]  . escitalopram (LEXAPRO) 20 MG tablet TAKE 1 TABLET BY MOUTH EVERY DAY (Patient taking differently: Take 20 mg by mouth daily.) 90 tablet 1 03/20/2021 at Unknown time  . ezetimibe (ZETIA) 10  MG tablet Take 1 tablet (10 mg total) by mouth daily. 90 tablet 3 03/20/2021 at Unknown time  . fluticasone (FLONASE) 50 MCG/ACT nasal spray Place 1 spray into both nostrils daily as needed for allergies.   03/19/2021 at Unknown time  . ibuprofen (ADVIL) 200 MG tablet Take 400 mg by mouth every 6 (six) hours as needed for fever, headache or mild pain.   Past Week at Unknown time  . mycophenolate (CELLCEPT) 500 MG tablet Take 1 tablet (500 mg total) by mouth 2 (two) times daily. 60 tablet 6 03/20/2021 at Unknown time  . pantoprazole (PROTONIX) 40 MG tablet Take 1 tablet (40 mg total) by mouth 2 (two) times daily. 180 tablet 1 03/20/2021 at Unknown time  . predniSONE (DELTASONE) 20 MG tablet TAKE 2 TABLETS (40 MG TOTAL) BY MOUTH DAILY. 60 tablet 0 03/20/2021 at Unknown time  . Probiotic Product (PROBIOTIC PO) Take 1 capsule by mouth daily.   03/20/2021 at Unknown time  . rosuvastatin (CRESTOR) 20 MG tablet Take 1 tablet (20 mg total) by mouth daily. (Patient taking differently: Take 20 mg by mouth at bedtime.) 90 tablet 3 03/19/2021 at Unknown time  . telmisartan-hydrochlorothiazide (MICARDIS HCT) 80-25 MG tablet TAKE 1 TABLET BY MOUTH EVERY DAY (Patient taking differently: Take 1 tablet by mouth daily.) 90 tablet 2 03/20/2021 at Unknown time   Scheduled:  Continuous: . heparin       ROS:                                                                                                                                       As per HPI.    Blood pressure 138/87, pulse 65, temperature 98.3 F (36.8 C), temperature source Oral, resp. rate 15, height 6' 2" (1.88 m), weight 108.9 kg, SpO2 97 %.   General Examination:  Physical Exam  HEENT-  [place]/AT  Lungs- Respirations unlabored Extremities- No edema  Neurological Examination Mental Status: Alert, oriented x 5, thought content appropriate.  Speech with  mild dysfluency manifested as intermittent pausing during speech, which patient describes as "having trouble finding the words" to make sentences. Able to name all 5 fingers except the ring finger. Repetition intact. Comprehension intact. Dyscalculia is noted. No dysarthria.  Cranial Nerves: II: Temporal visual fields intact without extinction to DSS. PERRL.   III,IV, VI: No ptosis. EOMI. No nystagmus.   V,VII: Smile symmetric, facial temp sensation equal bilaterally VIII: Hearing intact to voice IX,X: Palate rises symmetrically XI: Symmetric shoulder shrug XII: Midline tongue extension Motor: Right : Upper extremity   5/5    Left:     Upper extremity   5/5  Lower extremity   5/5     Lower extremity   5/5 Normal tone throughout; no atrophy noted Sensory: Temp and light touch intact throughout, bilaterally. No extinction to DSS.  Deep Tendon Reflexes: 2+ and symmetric throughout Plantars: Right: downgoing   Left: downgoing Cerebellar: No ataxia with FNF bilaterally Gait: Deferred   Lab Results: Basic Metabolic Panel: Recent Labs  Lab 03/20/21 1210  NA 135  K 4.2  CL 99  CO2 26  GLUCOSE 156*  BUN 28*  CREATININE 1.17  CALCIUM 9.7    CBC: Recent Labs  Lab 03/20/21 1210  WBC 10.1  NEUTROABS 9.2*  HGB 15.4  HCT 46.9  MCV 92.5  PLT 286    Cardiac Enzymes: No results for input(s): CKTOTAL, CKMB, CKMBINDEX, TROPONINI in the last 168 hours.  Lipid Panel: No results for input(s): CHOL, TRIG, HDL, CHOLHDL, VLDL, LDLCALC in the last 168 hours.  Imaging: CT ANGIO HEAD W OR WO CONTRAST  Result Date: 03/20/2021 CLINICAL DATA:  Acute neuro deficit.  Speech difficulty EXAM: CT ANGIOGRAPHY HEAD AND NECK TECHNIQUE: Multidetector CT imaging of the head and neck was performed using the standard protocol during bolus administration of intravenous contrast. Multiplanar CT image reconstructions and MIPs were obtained to evaluate the vascular anatomy. Carotid stenosis measurements (when  applicable) are obtained utilizing NASCET criteria, using the distal internal carotid diameter as the denominator. CONTRAST:  44m OMNIPAQUE IOHEXOL 350 MG/ML SOLN COMPARISON:  CT head 02/25/2021.  MRI head 03/20/2021 FINDINGS: CT HEAD FINDINGS Brain: Ill-defined hypodensity in the left mid frontal lobe involving cortex and white matter. This is consistent with acute infarct which shows restricted diffusion. No associated hemorrhage No other acute infarct, hemorrhage, mass.  Ventricle size normal Vascular: Negative for hyperdense vessel Skull: Negative Sinuses: Negative Orbits: Negative Review of the MIP images confirms the above findings CTA NECK FINDINGS Aortic arch: Bovine branching arch. Mild atherosclerotic disease proximal great vessels without stenosis Right carotid system: Right vertebral artery widely patent without stenosis. Minimal atherosclerotic disease right carotid bifurcation Left carotid system: No significant left carotid stenosis. No significant atherosclerotic disease Vertebral arteries: Both vertebral arteries widely patent to the basilar without stenosis. Skeleton: No acute skeletal abnormality. Other neck: 24 mm right thyroid nodule. No enlarged lymph nodes in the neck. Upper chest: Lung apices clear bilaterally. Review of the MIP images confirms the above findings CTA HEAD FINDINGS Anterior circulation: Cavernous carotid widely patent bilaterally. Anterior and middle cerebral arteries patent bilaterally. Left M1 segment widely patent. Decreased caliber and irregularity of 2 branches in the superior division of the left MCA likely due to thrombus with recent infarct in the left frontal lobe. This is best seen on series  17, image 25. There is distal occlusion of these vessels. Both anterior cerebral arteries widely patent. Right middle cerebral artery widely patent. Posterior circulation: Both vertebral arteries widely patent to the basilar. PICA patent bilaterally. Basilar widely patent.  Superior cerebellar and posterior cerebral arteries patent without stenosis Venous sinuses: Normal venous enhancement. Anatomic variants: None Review of the MIP images confirms the above findings IMPRESSION: 1. Hypodensity left frontal lobe compatible with acute infarct as identified on MRI today. There is small caliber and irregularity of 2 branches supplying this area involving the superior division left MCA compatible with thrombus, with distal occlusion. 2. Otherwise no intracranial stenosis 3. No extracranial stenosis in the carotid or vertebral arteries. 4. Code stroke imaging results were communicated on 03/20/2021 at 3:39 pm to provider Rory Percy via text page 5. 24 mm right thyroid nodule. Refer to recommendations on thyroid ultrasound 01/07/2021 Electronically Signed   By: Franchot Gallo M.D.   On: 03/20/2021 15:43   CT ANGIO NECK W OR WO CONTRAST  Result Date: 03/20/2021 CLINICAL DATA:  Acute neuro deficit.  Speech difficulty EXAM: CT ANGIOGRAPHY HEAD AND NECK TECHNIQUE: Multidetector CT imaging of the head and neck was performed using the standard protocol during bolus administration of intravenous contrast. Multiplanar CT image reconstructions and MIPs were obtained to evaluate the vascular anatomy. Carotid stenosis measurements (when applicable) are obtained utilizing NASCET criteria, using the distal internal carotid diameter as the denominator. CONTRAST:  56m OMNIPAQUE IOHEXOL 350 MG/ML SOLN COMPARISON:  CT head 02/25/2021.  MRI head 03/20/2021 FINDINGS: CT HEAD FINDINGS Brain: Ill-defined hypodensity in the left mid frontal lobe involving cortex and white matter. This is consistent with acute infarct which shows restricted diffusion. No associated hemorrhage No other acute infarct, hemorrhage, mass.  Ventricle size normal Vascular: Negative for hyperdense vessel Skull: Negative Sinuses: Negative Orbits: Negative Review of the MIP images confirms the above findings CTA NECK FINDINGS Aortic arch:  Bovine branching arch. Mild atherosclerotic disease proximal great vessels without stenosis Right carotid system: Right vertebral artery widely patent without stenosis. Minimal atherosclerotic disease right carotid bifurcation Left carotid system: No significant left carotid stenosis. No significant atherosclerotic disease Vertebral arteries: Both vertebral arteries widely patent to the basilar without stenosis. Skeleton: No acute skeletal abnormality. Other neck: 24 mm right thyroid nodule. No enlarged lymph nodes in the neck. Upper chest: Lung apices clear bilaterally. Review of the MIP images confirms the above findings CTA HEAD FINDINGS Anterior circulation: Cavernous carotid widely patent bilaterally. Anterior and middle cerebral arteries patent bilaterally. Left M1 segment widely patent. Decreased caliber and irregularity of 2 branches in the superior division of the left MCA likely due to thrombus with recent infarct in the left frontal lobe. This is best seen on series 17, image 25. There is distal occlusion of these vessels. Both anterior cerebral arteries widely patent. Right middle cerebral artery widely patent. Posterior circulation: Both vertebral arteries widely patent to the basilar. PICA patent bilaterally. Basilar widely patent. Superior cerebellar and posterior cerebral arteries patent without stenosis Venous sinuses: Normal venous enhancement. Anatomic variants: None Review of the MIP images confirms the above findings IMPRESSION: 1. Hypodensity left frontal lobe compatible with acute infarct as identified on MRI today. There is small caliber and irregularity of 2 branches supplying this area involving the superior division left MCA compatible with thrombus, with distal occlusion. 2. Otherwise no intracranial stenosis 3. No extracranial stenosis in the carotid or vertebral arteries. 4. Code stroke imaging results were communicated on 03/20/2021 at 3:39 pm to  provider Rory Percy via text page 5. 24 mm  right thyroid nodule. Refer to recommendations on thyroid ultrasound 01/07/2021 Electronically Signed   By: Franchot Gallo M.D.   On: 03/20/2021 15:43   MR Brain Wo Contrast (neuro protocol)  Result Date: 03/20/2021 CLINICAL DATA:  Neuro deficit, acute stroke suspected. EXAM: MRI HEAD WITHOUT CONTRAST TECHNIQUE: Multiplanar, multiecho pulse sequences of the brain and surrounding structures were obtained without intravenous contrast. COMPARISON:  CT head February 25, 2021.  MRI head September 06, 2019 FINDINGS: Brain: Acute infarct involving the left frontal cortex and operculum. Mild associated edema without mass effect. No acute hemorrhage. No hydrocephalus. Dilated perivascular spaces in the inferior basal ganglia bilaterally. No mass lesion. No midline shift. Basal cisterns are patent. No extra-axial fluid collections. Vascular: Major arterial flow voids are maintained at the skull base. Skull and upper cervical spine: Normal marrow signal. Sinuses/Orbits: Clear sinuses.  Unremarkable orbits. Other: No mastoid effusions. IMPRESSION: Acute infarct involving the left frontal cortex and operculum. Mild associated edema without mass effect. Electronically Signed   By: Margaretha Sheffield MD   On: 03/20/2021 13:43   CT CEREBRAL PERFUSION W CONTRAST  Result Date: 03/20/2021 EXAM: CT PERFUSION BRAIN TECHNIQUE: Multiphase CT imaging of the brain was performed following IV bolus contrast injection. Subsequent parametric perfusion maps were calculated using RAPID software. CONTRAST:  56m OMNIPAQUE IOHEXOL 350 MG/ML SOLN COMPARISON:  Same day CTA and MRI FINDINGS: CT Brain Perfusion Findings: CBF (<30%) Volume: 0101mPerfusion (Tmax>6.0s) volume: 15ML Mismatch Volume: 1516mnfarct Core: 0 mL Infarction Location:None identified. IMPRESSION: RAPID calculates an approximately 15 mL area of mismatch in the left frontal lobe; however, given that this area is relatively similar in size/extent to the hypodensity seen on same day  CTA the penumbra is favored to be less than 15 mL and probably not significant. Findings discussed with Dr. AroRory Percy 4:47 p.m. Electronically Signed   By: FreMargaretha Sheffield   On: 03/20/2021 16:54    Assessment: [age]ar old male with acute left frontal operculum ischemic infarction.  1. Exam reveals mild expressive dysphasia.   2. MRI brain: Acute infarct involving the left frontal cortex and operculum. Mild associated edema without mass effect. No acute hemorrhage.  3. CTA head: Hypodensity of left frontal lobe compatible with acute infarct as identified on MRI today. There is small caliber and irregularity of 2 branches supplying this area involving the superior division left MCA compatible with thrombus, with distal occlusion. 4. CTA neck: No extracranial stenosis in the carotid or vertebral arteries. 5. CTP: RAPID calculates an approximately 15 mL area of mismatch in the left frontal lobe; however, given that this area is relatively similar in size/extent to the hypodensity seen on same day CTA the penumbra is favored to be less than 15 mL and probably not significant. 6. EKG: Sinus rhythm 7. Stroke risk factors: Antiphospholipid syndrome, DVT, CKD, HTN, lupus, HLD, obesity, untreated OSA and history of PE 8. History of DVT and PE in the context of APLS. Patient was offered to be placed on Coumadin as an outpatient but he preferred to be on Lovenox. He was also told by his Rhematologist that other newer anticoagulation agents were not effective for antiphospholipid syndrome.  9. Diagnosis of lupus. CTA of head with severe stenoses in the MCA branches supplying the left frontal operculum. DDx for the stenoses includes vasculitic and thrombi/emboli.   Recommendations: 1. Rheumatology consult for APLS and lupus management. Possibly a vasculitis overlapping with APLS as the  etiology for patient's stroke, although less likely than APLS alone.  2. Has been started on IV heparin. Was on  therapeutic Lovenox at home due to history of PE/DVT/APLS.  3. Hypercoagulable panel (ordered) 4. Vasculitis panel.  5. ESR and CRP (ordered) 6. Comprehensive lupus panel (ordered) 7. Modified permissive HTN protocol due to being on heparin gtt. Treat if SBP > 180.  8. Hematology consult to determine if Coumadin would be more effective than SQ Lovenox as a long term anticoagulant in this patient with APLS.    Electronically signed: Dr. Kerney Elbe 03/20/2021, 7:16 PM

## 2021-03-20 NOTE — ED Provider Notes (Signed)
[facility] DEPT Provider Note   CSN: [id number] Arrival date & time: 03/20/21  1144     History Chief Complaint  Wesley Hancock presents with  . altered speech    Wesley Hancock is a 50 y.o. male.  50 year old male who presents with trouble expressing himself at times.  We woke this morning states that on certain words Wesley Hancock has trouble trying to express them verbally.  Denies any headache.  No nausea or vomiting.  No ataxia or focal weakness.  Does have a recent diagnosis of lupus and discharged take any new medication recently.  Denies any alcohol or sedative use.  Feels at his baseline otherwise.        Past Medical History:  Diagnosis Date  . Acute nonintractable headache 09/06/2019  . Acute respiratory failure with hypoxia ([place]) 08/30/2019  . AKI (acute kidney injury) ([place]) 08/30/2019  . Antiphospholipid syndrome ([place]) 10/30/2019  . Anxiety   . APS (antiphospholipid syndrome) ([place])   . Arthritis   . Colon polyp    ? hyperplastic  . Depression   . Diarrhea 05/21/2020  . DVT (deep venous thrombosis) ([place])   . Dyspnea on exertion 09/08/2017   "Quit smoking" 2015 with onset of symptoms in 2016  Spirometry 09/08/2017  Flat f/v loop  - d/c acei 09/08/2017  - 01/04/2020   Walked RA x two laps =  approx 520ft @ fast pace - stopped due to end of study/ min sob with sats of 93 % at the end of the study. - PFT's  03/08/20   FEV1 3.84 (83 % ) ratio 0..90  p 3 % improvement from saba p ? prior to study with DLCO  26.46 (77%) corrects to 4.76 (99%)    . Esophageal spasm   . Essential hypertension 09/23/2014   D/c acei 09/08/2017 due to pseudocopd   . GAD (generalized anxiety disorder) 08/06/2016  . Lobar pneumonia ([place]) 08/30/2019  . Lung nodule 09/21/2014  . Lupus ([place])   . Mixed hyperlipidemia 09/18/2015  . Morbid obesity due to excess calories ([place]) c/b hbp/ dvt/PE 09/10/2019  . 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 Nutcracker esophagus   . Paresthesia of right foot 11/08/2019  . Pulmonary infiltrates 09/10/2019   In setting of bilateral PE 08/23/2019 with antiphospholipid syndrome   . Recurrent pneumonia 07/09/2020   Formatting of this note might be different from the original. 06/26/20, 03/08/20  . Snoring 07/09/2020  . Upper airway cough syndrome 01/05/2020   Onset mid Jan 2021 while on otc PPI  - max rx for gerd 01/04/2020 >>>     . Witnessed apneic spells 07/09/2020    Wesley Hancock Active Problem List   Diagnosis Date Noted  . Mixed connective tissue disease ([place]) 01/21/2021  . Multinodular goiter 01/10/2021  . OSA on CPAP 10/11/2020  . Esophageal spasm   . Colon polyp   . Arthritis   . APS (antiphospholipid syndrome) ([place])   . Anxiety   . CKD (chronic kidney disease) stage 2, GFR 60-89 ml/min 07/11/2020  . Recurrent pneumonia 07/09/2020  . Snoring 07/09/2020  . Witnessed apneic spells 07/09/2020  . Pneumonia 06/26/2020  . Diarrhea 05/21/2020  . Paresthesia of right foot 11/08/2019  . Pulmonary infiltrates 09/10/2019  . Morbid obesity due to excess calories ([place])  c/b hbp/ dvt/PE 09/10/2019  . Acute nonintractable headache 09/06/2019  . Lobar pneumonia ([place]) 08/30/2019  . Acute respiratory failure with hypoxia ([place]) 08/30/2019  . AKI (acute kidney injury) ([place]) 08/30/2019  . DVT (deep venous thrombosis) ([place]) 08/30/2019  . Multiple pulmonary emboli ([place]) 08/22/2019  . Dyspnea on exertion 09/08/2017  . Depression 06/24/2017  . GAD (generalized anxiety disorder) 08/06/2016  . Mixed hyperlipidemia 09/18/2015  . Hypertensive heart disease 09/23/2014  . Nutcracker esophagus 09/21/2014  . Lung nodule 09/21/2014    Past Surgical History:  Procedure Laterality Date  . BRONCHIAL WASHINGS  01/14/2021   Procedure: BRONCHIAL WASHINGS;  Surgeon: Freddi Starr, MD;  Location: [facility];  Service: Pulmonary;;  . CHOLECYSTECTOMY    . VIDEO BRONCHOSCOPY N/A 01/14/2021   Procedure: VIDEO BRONCHOSCOPY WITHOUT FLUORO;  Surgeon: Freddi Starr, MD;  Location: [facility];  Service: Pulmonary;  Laterality: N/A;       Family History  Problem Relation Age of Onset  . Diabetes Mother   . Hyperlipidemia Mother   . Hypertension Mother   . Thyroid disease Mother        uncertain type--had surg--no cancer  . Heart disease Father 71  . Hyperlipidemia Father   . Hypertension Father   . Prostate cancer Father 22  . Lung cancer Father 21       Dx 06/18/2017  . Colon polyps Father   . Irritable bowel syndrome Father   . Diverticulitis Father   . Diabetes Sister   . Hyperlipidemia Sister   . Hypertension Sister   . Diabetes Maternal Grandmother   . Heart disease Maternal Grandmother   . Hyperlipidemia Maternal Grandmother   . Hypertension Maternal Grandmother   . Colon cancer Maternal Grandmother   . Heart disease Maternal Grandfather   . Hyperlipidemia Maternal Grandfather   . Hypertension Maternal Grandfather   . Stroke Maternal Grandfather   . Liver cancer Maternal Grandfather   . Irritable bowel syndrome Maternal Grandfather   . Heart disease Paternal Grandmother     Social History   Tobacco Use  . Smoking status: Former Smoker    Packs/day: 1.00    Years: 20.00    Pack years: 20.00    Types: Cigarettes    Quit date: 11/30/2013    Years since quitting: 7.3  . Smokeless tobacco: Former Systems developer    Types: Snuff    Quit date: 05/21/2018  Vaping Use  . Vaping Use: Never used  Substance Use Topics  . Alcohol use: Yes    Alcohol/week: 5.0 standard drinks    Types: 5 Cans of beer per week    Comment: 1 per day  . Drug use: No    Home Medications Prior to Admission medications   Medication Sig Start Date End Date Taking? Authorizing Provider  acetaminophen (TYLENOL) 500 MG tablet Take 1,000 mg by mouth every 6 (six)  hours as needed for mild pain.    [provider]  buPROPion (WELLBUTRIN XL) 150 MG 24 hr tablet Take 1 tablet daily 11/25/20   Vivi Barrack, MD  enoxaparin (LOVENOX) 120 MG/0.8ML injection Inject 120 mg into the skin in the morning and at bedtime.    [provider]  escitalopram (LEXAPRO) 20 MG tablet TAKE 1 TABLET BY MOUTH EVERY DAY Wesley Hancock taking differently: Take 20 mg by mouth daily. 12/30/20   Inda Coke, PA  ezetimibe (ZETIA) 10 MG tablet Take 1 tablet (10 mg total) by mouth daily. 07/15/20 10/21/20  Shirlee More  J, MD  fluticasone (FLONASE) 50 MCG/ACT nasal spray Place 1 spray into both nostrils daily as needed for allergies.    [provider]  mycophenolate (CELLCEPT) 500 MG tablet Take 1 tablet (500 mg total) by mouth 2 (two) times daily. 01/30/21   Freddi Starr, MD  pantoprazole (PROTONIX) 40 MG tablet Take 1 tablet (40 mg total) by mouth 2 (two) times daily. 03/08/20   Parrett, Fonnie Mu, NP  predniSONE (DELTASONE) 20 MG tablet TAKE 2 TABLETS (40 MG TOTAL) BY MOUTH DAILY. 03/03/21 04/02/21  Freddi Starr, MD  Probiotic Product (PROBIOTIC PO) Take 1 capsule by mouth daily.    [provider]  rosuvastatin (CRESTOR) 20 MG tablet Take 1 tablet (20 mg total) by mouth daily. Wesley Hancock taking differently: Take 20 mg by mouth at bedtime. 07/15/20 10/13/20  Richardo Priest, MD  telmisartan-hydrochlorothiazide (MICARDIS HCT) 80-25 MG tablet TAKE 1 TABLET BY MOUTH EVERY DAY Wesley Hancock taking differently: Take 1 tablet by mouth daily. 11/12/20   Inda Coke, PA    Allergies    Diltiazem hcl  Review of Systems   Review of Systems  All other systems reviewed and are negative.   Physical Exam Updated Vital Signs BP (!) 145/106 (BP Location: Left Arm)   Pulse 96   Temp 98.3 F (36.8 C) (Oral)   Resp 18   Ht 1.88 m (6\' 2" )   Wt 108.9 kg   SpO2 97%   BMI 30.81 kg/m   Physical Exam Vitals and nursing note reviewed.  Constitutional:       General: Wesley Hancock is not in acute distress.    Appearance: Normal appearance. Wesley Hancock is well-developed. Wesley Hancock is not toxic-appearing.  HENT:     Head: Normocephalic and atraumatic.  Eyes:     General: Lids are normal.     Conjunctiva/sclera: Conjunctivae normal.     Pupils: Pupils are equal, round, and reactive to light.  Neck:     Thyroid: No thyroid mass.     Trachea: No tracheal deviation.  Cardiovascular:     Rate and Rhythm: Normal rate and regular rhythm.     Heart sounds: Normal heart sounds. No murmur heard. No gallop.   Pulmonary:     Effort: Pulmonary effort is normal. No respiratory distress.     Breath sounds: Normal breath sounds. No stridor. No decreased breath sounds, wheezing, rhonchi or rales.  Abdominal:     General: Bowel sounds are normal. There is no distension.     Palpations: Abdomen is soft.     Tenderness: There is no abdominal tenderness. There is no rebound.  Musculoskeletal:        General: No tenderness. Normal range of motion.     Cervical back: Normal range of motion and neck supple.  Skin:    General: Skin is warm and dry.     Findings: No abrasion or rash.  Neurological:     General: No focal deficit present.     Mental Status: Wesley Hancock is alert and oriented to person, place, and time.     GCS: GCS eye subscore is 4. GCS verbal subscore is 5. GCS motor subscore is 6.     Cranial Nerves: Cranial nerves are intact. No cranial nerve deficit.     Sensory: No sensory deficit.     Motor: Motor function is intact.     Coordination: Coordination is intact.  Psychiatric:        Speech: Speech normal.  Behavior: Behavior normal.     ED Results / Procedures / Treatments   Labs (all labs ordered are listed, but only abnormal results are displayed) Labs Reviewed  CBC WITH DIFFERENTIAL/PLATELET  COMPREHENSIVE METABOLIC PANEL    EKG EKG Interpretation  Date/Time:  Thursday March 20 2021 12:54:54 EDT Ventricular Rate:  102 PR Interval:  144 QRS  Duration: 88 QT Interval:  350 QTC Calculation: 456 R Axis:   33 Text Interpretation: Sinus tachycardia Probable left atrial enlargement Confirmed by Lacretia Leigh ([id number]) on 03/20/2021 2:07:23 PM   Radiology No results found.  Procedures Procedures   Medications Ordered in ED Medications - No data to display  ED Course  I have reviewed the triage vital signs and the nursing notes.  Pertinent labs & imaging results that were available during my care of the Wesley Hancock were reviewed by me and considered in my medical decision making (see chart for details).    MDM Rules/Calculators/A&P                          Discussed with neurologist on-call recommends MRI to rule out lupus cerebritis.  If negative feels that Wesley Hancock can go home   2:07 PM Wesley Hancock's MRI of brain is positive for acute infarct.  Discussed with Dr. Malen Gauze from neurology recommends admission to [facility] on the hospitalist service.  Will consult hospitalist for admission. Final Clinical Impression(s) / ED Diagnoses Final diagnoses:  None    Rx / DC Orders ED Discharge Orders    None       Lacretia Leigh, MD 03/20/21 1407

## 2021-03-20 NOTE — H&P (Signed)
History and Physical    Wesley Hancock HAL:[id number] DOB: 1971/03/30 DOA: 03/20/2021  PCP: Wesley Coke, PA  Patient coming from: Home  Chief Complaint: Difficulty finding words  HPI: Wesley Hancock is a 50 y.o. male with medical history significant of pulmonary emboli with antiphospholipid syndrome on Lovenox chronically, recent possible diagnosis of lupus but has not been evaluated yet by rheumatology referred by his hematologist, hypertension, chronic kidney disease comes in with waking up this morning with expressive aphasia.  His wife noticed he was having difficulty finding words.  Patient has no other neurological symptoms.  He denies any weakness anywhere.  He denies any numbness or tingling anywhere.  He denies any headache.  Since arrival this morning his speech is gotten much better.  He has no prior history of CVA.  He also has untreated obstructive sleep apnea and is awaiting to be started on CPAP at home.  He denies any fevers nausea or vomiting.  Patient found to have acute CVA on his MRI.  Neurology at [facility] has been consulted recommending transferring patient to [facility] for further stroke work-up and evaluation by stroke team.  Review of Systems: As per HPI otherwise 10 point review of systems negative.   Past Medical History:  Diagnosis Date  . Acute nonintractable headache 09/06/2019  . Acute respiratory failure with hypoxia ([place]) 08/30/2019  . AKI (acute kidney injury) ([place]) 08/30/2019  . Antiphospholipid syndrome ([place]) 10/30/2019  . Anxiety   . APS (antiphospholipid syndrome) ([place])   . Arthritis   . Colon polyp    ? hyperplastic  . Depression   . Diarrhea 05/21/2020  . DVT (deep venous thrombosis) ([place])   . Dyspnea on exertion 09/08/2017   "Quit smoking" 2015 with onset of symptoms in 2016  Spirometry 09/08/2017  Flat f/v loop  - d/c acei 09/08/2017  - 01/04/2020   Walked RA x two laps =  approx 563ft @ fast pace - stopped due to end of study/ min sob with sats of  93 % at the end of the study. - PFT's  03/08/20   FEV1 3.84 (83 % ) ratio 0..90  p 3 % improvement from saba p ? prior to study with DLCO  26.46 (77%) corrects to 4.76 (99%)    . Esophageal spasm   . Essential hypertension 09/23/2014   D/c acei 09/08/2017 due to pseudocopd   . GAD (generalized anxiety disorder) 08/06/2016  . Lobar pneumonia ([place]) 08/30/2019  . Lung nodule 09/21/2014  . Lupus ([place])   . Mixed hyperlipidemia 09/18/2015  . Morbid obesity due to excess calories ([place]) c/b hbp/ dvt/PE 09/10/2019  . 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 Nutcracker esophagus   . Paresthesia of right foot 11/08/2019  . Pulmonary infiltrates 09/10/2019   In setting of bilateral PE 08/23/2019 with antiphospholipid syndrome   . Recurrent pneumonia 07/09/2020   Formatting of this note might be different from the original. 06/26/20, 03/08/20  . Snoring 07/09/2020  . Upper airway cough syndrome 01/05/2020   Onset mid Jan 2021 while on otc PPI  - max rx for gerd 01/04/2020 >>>     . Witnessed apneic spells 07/09/2020    Past Surgical History:  Procedure Laterality Date  . BRONCHIAL WASHINGS  01/14/2021   Procedure: BRONCHIAL WASHINGS;  Surgeon: Erin Fulling,  Cheryle Horsfall, MD;  Location: [facility];  Service: Pulmonary;;  . CHOLECYSTECTOMY    . VIDEO BRONCHOSCOPY N/A 01/14/2021   Procedure: VIDEO BRONCHOSCOPY WITHOUT FLUORO;  Surgeon: Freddi Starr, MD;  Location: [facility];  Service: Pulmonary;  Laterality: N/A;     reports that he quit smoking about 7 years ago. His smoking use included cigarettes. He has a 20.00 pack-year smoking history. He quit smokeless tobacco use about 2 years ago.  His smokeless tobacco use included snuff. He reports current alcohol use of about 5.0 standard drinks of alcohol per week. He reports that  he does not use drugs.  Allergies  Allergen Reactions  . Diltiazem Hcl Diarrhea and Other (See Comments)    Lethargic     Family History  Problem Relation Age of Onset  . Diabetes Mother   . Hyperlipidemia Mother   . Hypertension Mother   . Thyroid disease Mother        uncertain type--had surg--no cancer  . Heart disease Father 37  . Hyperlipidemia Father   . Hypertension Father   . Prostate cancer Father 74  . Lung cancer Father 43       Dx 06/18/2017  . Colon polyps Father   . Irritable bowel syndrome Father   . Diverticulitis Father   . Diabetes Sister   . Hyperlipidemia Sister   . Hypertension Sister   . Diabetes Maternal Grandmother   . Heart disease Maternal Grandmother   . Hyperlipidemia Maternal Grandmother   . Hypertension Maternal Grandmother   . Colon cancer Maternal Grandmother   . Heart disease Maternal Grandfather   . Hyperlipidemia Maternal Grandfather   . Hypertension Maternal Grandfather   . Stroke Maternal Grandfather   . Liver cancer Maternal Grandfather   . Irritable bowel syndrome Maternal Grandfather   . Heart disease Paternal Grandmother     Prior to Admission medications   Medication Sig Start Date End Date Taking? Authorizing Provider  acetaminophen (TYLENOL) 500 MG tablet Take 1,000 mg by mouth every 6 (six) hours as needed for mild pain.   Yes [provider]  buPROPion (WELLBUTRIN XL) 150 MG 24 hr tablet Take 1 tablet daily Patient taking differently: Take 150 mg by mouth daily. 11/25/20  Yes Vivi Barrack, MD  enoxaparin (LOVENOX) 120 MG/0.8ML injection Inject 120 mg into the skin in the morning and at bedtime.   Yes [provider]  escitalopram (LEXAPRO) 20 MG tablet TAKE 1 TABLET BY MOUTH EVERY DAY Patient taking differently: Take 20 mg by mouth daily. 12/30/20  Yes Wesley Coke, PA  ezetimibe (ZETIA) 10 MG tablet Take 1 tablet (10 mg total) by mouth daily. 07/15/20 10/21/20 Yes Richardo Priest, MD  fluticasone  (FLONASE) 50 MCG/ACT nasal spray Place 1 spray into both nostrils daily as needed for allergies.   Yes [provider]  ibuprofen (ADVIL) 200 MG tablet Take 400 mg by mouth every 6 (six) hours as needed for fever, headache or mild pain.   Yes [provider]  mycophenolate (CELLCEPT) 500 MG tablet Take 1 tablet (500 mg total) by mouth 2 (two) times daily. 01/30/21  Yes Freddi Starr, MD  pantoprazole (PROTONIX) 40 MG tablet Take 1 tablet (40 mg total) by mouth 2 (two) times daily. 03/08/20  Yes Parrett, Tammy S, NP  predniSONE (DELTASONE) 20 MG tablet TAKE 2 TABLETS (40 MG TOTAL) BY MOUTH DAILY. 03/03/21 04/02/21 Yes Freddi Starr, MD  Probiotic Product (PROBIOTIC PO) Take 1 capsule by mouth  daily.   Yes [provider]  rosuvastatin (CRESTOR) 20 MG tablet Take 1 tablet (20 mg total) by mouth daily. Patient taking differently: Take 20 mg by mouth at bedtime. 07/15/20 10/13/20 Yes Richardo Priest, MD  telmisartan-hydrochlorothiazide (MICARDIS HCT) 80-25 MG tablet TAKE 1 TABLET BY MOUTH EVERY DAY Patient taking differently: Take 1 tablet by mouth daily. 11/12/20  Yes Wesley Hancock, [place]    Physical Exam: Vitals:   03/20/21 1256 03/20/21 1257 03/20/21 1258 03/20/21 1353  BP: (!) 147/93   (!) 153/94  Pulse: 94 93 92 87  Resp: (!) 22 13 16 18   Temp:      TempSrc:      SpO2: 95% 97% 97% 95%  Weight:      Height:          Constitutional: NAD, calm, comfortable Vitals:   03/20/21 1256 03/20/21 1257 03/20/21 1258 03/20/21 1353  BP: (!) 147/93   (!) 153/94  Pulse: 94 93 92 87  Resp: (!) 22 13 16 18   Temp:      TempSrc:      SpO2: 95% 97% 97% 95%  Weight:      Height:       Eyes: PERRL, lids and conjunctivae normal ENMT: Mucous membranes are moist. Posterior pharynx clear of any exudate or lesions.Normal dentition.  Neck: normal, supple, no masses, no thyromegaly Respiratory: clear to auscultation bilaterally, no wheezing, no crackles. Normal respiratory  effort. No accessory muscle use.  Cardiovascular: Regular rate and rhythm, no murmurs / rubs / gallops. No extremity edema. 2+ pedal pulses. No carotid bruits.  Abdomen: no tenderness, no masses palpated. No hepatosplenomegaly. Bowel sounds positive.  Musculoskeletal: no clubbing / cyanosis. No joint deformity upper and lower extremities. Good ROM, no contractures. Normal muscle tone.  Skin: no rashes, lesions, ulcers. No induration Neurologic: CN 2-12 grossly intact. Sensation intact, DTR normal. Strength 5/5 in all 4.  Mild expressive aphasia noted Psychiatric: Normal judgment and insight. Alert and oriented x 3. Normal mood.    Labs on Admission: I have personally reviewed following labs and imaging studies  CBC: Recent Labs  Lab 03/20/21 1210  WBC 10.1  NEUTROABS 9.2*  HGB 15.4  HCT 46.9  MCV 92.5  PLT 888   Basic Metabolic Panel: Recent Labs  Lab 03/20/21 1210  NA 135  K 4.2  CL 99  CO2 26  GLUCOSE 156*  BUN 28*  CREATININE 1.17  CALCIUM 9.7   GFR: Estimated Creatinine Clearance: 101.5 mL/min (by C-G formula based on SCr of 1.17 mg/dL). Liver Function Tests: Recent Labs  Lab 03/20/21 1210  AST 27  ALT 42  ALKPHOS 48  BILITOT 0.9  PROT 8.4*  ALBUMIN 4.5   No results for input(s): LIPASE, AMYLASE in the last 168 hours. No results for input(s): AMMONIA in the last 168 hours. Coagulation Profile: No results for input(s): INR, PROTIME in the last 168 hours. Cardiac Enzymes: No results for input(s): CKTOTAL, CKMB, CKMBINDEX, TROPONINI in the last 168 hours. BNP (last 3 results) Recent Labs    07/12/20 0913  PROBNP <5   HbA1C: No results for input(s): HGBA1C in the last 72 hours. CBG: No results for input(s): GLUCAP in the last 168 hours. Lipid Profile: No results for input(s): CHOL, HDL, LDLCALC, TRIG, CHOLHDL, LDLDIRECT in the last 72 hours. Thyroid Function Tests: No results for input(s): TSH, T4TOTAL, FREET4, T3FREE, THYROIDAB in the last 72  hours. Anemia Panel: No results for input(s): VITAMINB12, FOLATE, FERRITIN, TIBC, IRON,  RETICCTPCT in the last 72 hours. Urine analysis:    Component Value Date/Time   COLORURINE YELLOW 01/15/2021 1800   APPEARANCEUR CLEAR 01/15/2021 1800   LABSPEC 1.017 01/15/2021 1800   PHURINE 7.0 01/15/2021 1800   GLUCOSEU 50 (A) 01/15/2021 1800   HGBUR NEGATIVE 01/15/2021 1800   BILIRUBINUR NEGATIVE 01/15/2021 1800   KETONESUR NEGATIVE 01/15/2021 1800   PROTEINUR NEGATIVE 01/15/2021 1800   NITRITE NEGATIVE 01/15/2021 1800   LEUKOCYTESUR NEGATIVE 01/15/2021 1800   Sepsis Labs: !!!!!!!!!!!!!!!!!!!!!!!!!!!!!!!!!!!!!!!!!!!! @LABRCNTIP (procalcitonin:4,lacticidven:4) )No results found for this or any previous visit (from the past 240 hour(s)).   Radiological Exams on Admission: MR Brain Wo Contrast (neuro protocol)  Result Date: 03/20/2021 CLINICAL DATA:  Neuro deficit, acute stroke suspected. EXAM: MRI HEAD WITHOUT CONTRAST TECHNIQUE: Multiplanar, multiecho pulse sequences of the brain and surrounding structures were obtained without intravenous contrast. COMPARISON:  CT head February 25, 2021.  MRI head September 06, 2019 FINDINGS: Brain: Acute infarct involving the left frontal cortex and operculum. Mild associated edema without mass effect. No acute hemorrhage. No hydrocephalus. Dilated perivascular spaces in the inferior basal ganglia bilaterally. No mass lesion. No midline shift. Basal cisterns are patent. No extra-axial fluid collections. Vascular: Major arterial flow voids are maintained at the skull base. Skull and upper cervical spine: Normal marrow signal. Sinuses/Orbits: Clear sinuses.  Unremarkable orbits. Other: No mastoid effusions. IMPRESSION: Acute infarct involving the left frontal cortex and operculum. Mild associated edema without mass effect. Electronically Signed   By: Margaretha Sheffield MD   On: 03/20/2021 13:43    EKG: Independently reviewed.  Sinus tach rate 102 no acute changes Old  chart reviewed Case discussed with ED provider  Assessment/Plan  50 year old male with a history of antiphospholipid syndrome, hypertension, chronic kidney disease comes in with expressive aphasia found to have acute CVA  Principal Problem:    CVA (cerebral vascular accident) ([name])-MRI exhibits left frontal cortex and operculum. Mild associated edema without mass effect.  Patient chronically on Lovenox.  Will give aspirin today.  Further recommendations for antiplatelet and anticoagulation per neurology team over at home.  Obtain frequent neurological checks.  Obtain CTA head neck that has been ordered stat along with cardiac echo.  Expressive aphasia appears to be improving per patient report.  Allow permissive hypertension.  Check fasting lipid panel, hemoglobin A1c.  [facility] for further stroke team evaluation.  Active Problems:    Hypertensive heart disease-allow permissive hypertension in the setting of acute stroke    GAD (generalized anxiety disorder)-noted and stable at this time    CKD (chronic kidney disease) stage 2, GFR 60-89 ml/min-stable    APS (antiphospholipid syndrome) ([name])-patient chronically on Lovenox and also has a history of PE in the past.  We will hold this at this time pending neurology evaluation.  Patient was offered to be placed on Coumadin but he preferred to be on Lovenox.  He was also told by his hematologist that other newer anticoagulation agents were not effective for antiphospholipid syndrome    Mixed connective tissue disease ([name])-patient undergoing evaluation and formal diagnosis of lupus.  MRI imaging does not show however component of vasculitis but no focal infarct.  Further recommendations per neurology team    Further recommendations pending on overall hospital course   DVT prophylaxis: SCDs Code Status: Full Family Communication: None Disposition Plan: 1 to 3 days Consults called: Neurology at [facility] Admission  status: Admission   Jenette Rayson A MD [facility]  If 7PM-7AM, please contact  night-coverage [email or web address] Password [email or web address]  03/20/2021, 2:38 PM

## 2021-03-20 NOTE — ED Triage Notes (Signed)
Went to bed at 11 pm last night with wife, was normal. Woke up this morning at 7am and was 'having trouble getting words out.' Denies any recent falls or trauma, on blood thinners.

## 2021-03-20 NOTE — ED Notes (Signed)
Pt given phone 

## 2021-03-20 NOTE — ED Notes (Signed)
Pt still in mri 

## 2021-03-20 NOTE — ED Notes (Signed)
Patient transported to MRI 

## 2021-03-20 NOTE — Progress Notes (Signed)
[place] for Heparin Indication: stroke  Allergies  Allergen Reactions  . Diltiazem Hcl Diarrhea and Other (See Comments)    Lethargic    Patient Measurements: Height: 6\' 2"  (188 cm) Weight: 108.9 kg (240 lb) IBW/kg (Calculated) : 82.2 Heparin Dosing Weight: 104.6 kg  Vital Signs: Temp: 98.3 F (36.8 C) (04/21 1156) Temp Source: Oral (04/21 1156) BP: 141/93 (04/21 1400) Pulse Rate: 81 (04/21 1400)  Labs: Recent Labs    03/20/21 1210 03/20/21 1407  HGB 15.4  --   HCT 46.9  --   PLT 286  --   APTT  --  44*  LABPROT  --  12.3  INR  --  0.9  CREATININE 1.17  --     Estimated Creatinine Clearance: 101.5 mL/min (by C-G formula based on SCr of 1.17 mg/dL).   Medical History: Past Medical History:  Diagnosis Date  . Acute nonintractable headache 09/06/2019  . Acute respiratory failure with hypoxia ([place]) 08/30/2019  . AKI (acute kidney injury) ([place]) 08/30/2019  . Antiphospholipid syndrome ([place]) 10/30/2019  . Anxiety   . APS (antiphospholipid syndrome) ([place])   . Arthritis   . Colon polyp    ? hyperplastic  . Depression   . Diarrhea 05/21/2020  . DVT (deep venous thrombosis) ([place])   . Dyspnea on exertion 09/08/2017   "Quit smoking" 2015 with onset of symptoms in 2016  Spirometry 09/08/2017  Flat f/v loop  - d/c acei 09/08/2017  - 01/04/2020   Walked RA x two laps =  approx 550ft @ fast pace - stopped due to end of study/ min sob with sats of 93 % at the end of the study. - PFT's  03/08/20   FEV1 3.84 (83 % ) ratio 0..90  p 3 % improvement from saba p ? prior to study with DLCO  26.46 (77%) corrects to 4.76 (99%)    . Esophageal spasm   . Essential hypertension 09/23/2014   D/c acei 09/08/2017 due to pseudocopd   . GAD (generalized anxiety disorder) 08/06/2016  . Lobar pneumonia ([place]) 08/30/2019  . Lung nodule 09/21/2014  . Lupus ([place])   . Mixed hyperlipidemia 09/18/2015  . Morbid obesity due to excess calories ([place]) c/b hbp/ dvt/PE  09/10/2019  . 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 Nutcracker esophagus   . Paresthesia of right foot 11/08/2019  . Pulmonary infiltrates 09/10/2019   In setting of bilateral PE 08/23/2019 with antiphospholipid syndrome   . Recurrent pneumonia 07/09/2020   Formatting of this note might be different from the original. 06/26/20, 03/08/20  . Snoring 07/09/2020  . Upper airway cough syndrome 01/05/2020   Onset mid Jan 2021 while on otc PPI  - max rx for gerd 01/04/2020 >>>     . Witnessed apneic spells 07/09/2020   Medications:  Scheduled:  Infusions:   Assessment: 49 yoM to ED with mild word-finding difficulty, no other deficit. Hx of Anti-phospholipid Ab syndrome on Lovenox 120mg  SQ bid - Last dose 4/21 at 0645 am Per Neuro note: Plan CT perfusion study - if huge penumbra consider thrombectomy. Begin Heparin infusion, no bolus, and transfer to [facility].    Goal of Therapy:  Heparin level 0.3-0.5 units/ml Monitor platelets by anticoagulation protocol: Yes   Plan:  Begin Heparin  infusion at 1300 units/hr at 1930 pm, no bolus Check 6 hr heparin level at 0200 (4/22) Daily CBC, daily Heparin level if continued  Minda Ditto PharmD 03/20/2021,4:15 PM

## 2021-03-21 ENCOUNTER — Other Ambulatory Visit (HOSPITAL_COMMUNITY): Payer: BC Managed Care – PPO

## 2021-03-21 DIAGNOSIS — D6861 Antiphospholipid syndrome: Secondary | ICD-10-CM

## 2021-03-21 DIAGNOSIS — F411 Generalized anxiety disorder: Secondary | ICD-10-CM

## 2021-03-21 DIAGNOSIS — I119 Hypertensive heart disease without heart failure: Secondary | ICD-10-CM

## 2021-03-21 DIAGNOSIS — N182 Chronic kidney disease, stage 2 (mild): Secondary | ICD-10-CM

## 2021-03-21 DIAGNOSIS — M351 Other overlap syndromes: Secondary | ICD-10-CM

## 2021-03-21 LAB — CBC
HCT: 45.3 % (ref 39.0–52.0)
Hemoglobin: 14.6 g/dL (ref 13.0–17.0)
MCH: 30.2 pg (ref 26.0–34.0)
MCHC: 32.2 g/dL (ref 30.0–36.0)
MCV: 93.8 fL (ref 80.0–100.0)
Platelets: 263 10*3/uL (ref 150–400)
RBC: 4.83 MIL/uL (ref 4.22–5.81)
RDW: 13.8 % (ref 11.5–15.5)
WBC: 10.6 10*3/uL — ABNORMAL HIGH (ref 4.0–10.5)
nRBC: 0 % (ref 0.0–0.2)

## 2021-03-21 LAB — URINALYSIS, ROUTINE W REFLEX MICROSCOPIC
Bilirubin Urine: NEGATIVE
Glucose, UA: NEGATIVE mg/dL
Hgb urine dipstick: NEGATIVE
Ketones, ur: NEGATIVE mg/dL
Leukocytes,Ua: NEGATIVE
Nitrite: NEGATIVE
Protein, ur: NEGATIVE mg/dL
Specific Gravity, Urine: 1.04 — ABNORMAL HIGH (ref 1.005–1.030)
pH: 7 (ref 5.0–8.0)

## 2021-03-21 LAB — HEPARIN LEVEL (UNFRACTIONATED)
Heparin Unfractionated: 0.12 IU/mL — ABNORMAL LOW (ref 0.30–0.70)
Heparin Unfractionated: 0.15 IU/mL — ABNORMAL LOW (ref 0.30–0.70)

## 2021-03-21 LAB — RAPID URINE DRUG SCREEN, HOSP PERFORMED
Amphetamines: NOT DETECTED
Barbiturates: NOT DETECTED
Benzodiazepines: NOT DETECTED
Cocaine: NOT DETECTED
Opiates: NOT DETECTED
Tetrahydrocannabinol: NOT DETECTED

## 2021-03-21 LAB — LIPID PANEL
Cholesterol: 325 mg/dL — ABNORMAL HIGH (ref 0–200)
HDL: 51 mg/dL (ref >40)
LDL Cholesterol: 234 mg/dL — ABNORMAL HIGH (ref 0–99)
Total CHOL/HDL Ratio: 6.4 RATIO
Triglycerides: 202 mg/dL — ABNORMAL HIGH (ref <150)
VLDL: 40 mg/dL (ref 0–40)

## 2021-03-21 LAB — HEMOGLOBIN A1C
Hgb A1c MFr Bld: 6 % — ABNORMAL HIGH (ref 4.8–5.6)
Mean Plasma Glucose: 125.5 mg/dL

## 2021-03-21 MED ORDER — ACETAMINOPHEN 500 MG PO TABS
1000.0000 mg | ORAL_TABLET | Freq: Four times a day (QID) | ORAL | Status: DC | PRN
  Administered 2021-03-22: 1000 mg via ORAL
  Filled 2021-03-21: qty 2

## 2021-03-21 MED ORDER — FLUTICASONE PROPIONATE 50 MCG/ACT NA SUSP: 1.0000 | Freq: Every day | NASAL | Status: DC | PRN

## 2021-03-21 MED ORDER — ROSUVASTATIN CALCIUM 20 MG PO TABS: 20.0000 mg | ORAL_TABLET | Freq: Every day | ORAL | Status: DC

## 2021-03-21 MED ORDER — WARFARIN - PHARMACIST DOSING INPATIENT: Freq: Every day | Status: DC

## 2021-03-21 MED ORDER — BUPROPION HCL ER (XL) 150 MG PO TB24
150.0000 mg | ORAL_TABLET | Freq: Every day | ORAL | Status: DC
  Administered 2021-03-21 – 2021-03-24 (×4): 150 mg via ORAL
  Filled 2021-03-21 (×4): qty 1

## 2021-03-21 MED ORDER — PREDNISONE 20 MG PO TABS
40.0000 mg | ORAL_TABLET | Freq: Every day | ORAL | Status: DC
  Administered 2021-03-21 – 2021-03-24 (×4): 40 mg via ORAL
  Filled 2021-03-21 (×4): qty 2

## 2021-03-21 MED ORDER — MYCOPHENOLATE MOFETIL 250 MG PO CAPS
500.0000 mg | ORAL_CAPSULE | Freq: Two times a day (BID) | ORAL | Status: DC
  Administered 2021-03-21 – 2021-03-24 (×7): 500 mg via ORAL
  Filled 2021-03-21 (×8): qty 2

## 2021-03-21 MED ORDER — PANTOPRAZOLE SODIUM 40 MG PO TBEC
40.0000 mg | DELAYED_RELEASE_TABLET | Freq: Two times a day (BID) | ORAL | Status: DC
  Administered 2021-03-21 – 2021-03-24 (×6): 40 mg via ORAL
  Filled 2021-03-21 (×6): qty 1

## 2021-03-21 MED ORDER — ROSUVASTATIN CALCIUM 20 MG PO TABS
40.0000 mg | ORAL_TABLET | Freq: Every day | ORAL | Status: DC
  Administered 2021-03-21 – 2021-03-24 (×4): 40 mg via ORAL
  Filled 2021-03-21 (×4): qty 2

## 2021-03-21 MED ORDER — ESCITALOPRAM OXALATE 10 MG PO TABS
20.0000 mg | ORAL_TABLET | Freq: Every day | ORAL | Status: DC
  Administered 2021-03-21 – 2021-03-24 (×4): 20 mg via ORAL
  Filled 2021-03-21 (×4): qty 2

## 2021-03-21 MED ORDER — WARFARIN SODIUM 7.5 MG PO TABS
7.5000 mg | ORAL_TABLET | Freq: Once | ORAL | Status: AC
  Administered 2021-03-21: 7.5 mg via ORAL
  Filled 2021-03-21: qty 1

## 2021-03-21 MED ORDER — EZETIMIBE 10 MG PO TABS
10.0000 mg | ORAL_TABLET | Freq: Every day | ORAL | Status: DC
  Administered 2021-03-21 – 2021-03-24 (×4): 10 mg via ORAL
  Filled 2021-03-21 (×4): qty 1

## 2021-03-21 NOTE — Progress Notes (Addendum)
STROKE TEAM PROGRESS NOTE   INTERVAL HISTORY Patient is in his bed appears cheerful. Patient reports that he initially presented due to suddenly having problems with his speech on his way to work. Patient reports that although some of it has resolved he feels that he is still having problems "putting words where the are supposed to be." Patient also reports that he has a mild headache that is decreasing in intensity and says that it is now a "1."  Vitals:   03/21/21 0006 03/21/21 0312 03/21/21 0924 03/21/21 1310  BP: (!) 150/90 (!) 156/84 136/87 (!) 147/96  Pulse: 82 75 93 82  Resp:   20 18  Temp: 98.1 F (36.7 C) 97.7 F (36.5 C) 98.5 F (36.9 C) (!) 97.5 F (36.4 C)  TempSrc: Oral Oral Oral Oral  SpO2: 97% 92% 94% 97%  Weight:      Height:       CBC:  Recent Labs  Lab 03/20/21 1210 03/21/21 0550  WBC 10.1 10.6*  NEUTROABS 9.2*  --   HGB 15.4 14.6  HCT 46.9 45.3  MCV 92.5 93.8  PLT 286 628   Basic Metabolic Panel:  Recent Labs  Lab 03/20/21 1210  NA 135  K 4.2  CL 99  CO2 26  GLUCOSE 156*  BUN 28*  CREATININE 1.17  CALCIUM 9.7   Lipid Panel:  Recent Labs  Lab 03/21/21 0550  CHOL 325*  TRIG 202*  HDL 51  CHOLHDL 6.4  VLDL 40  LDLCALC 234*   HgbA1c:  Recent Labs  Lab 03/21/21 0550  HGBA1C 6.0*   Urine Drug Screen:  Recent Labs  Lab 03/20/21 2350  LABOPIA NONE DETECTED  COCAINSCRNUR NONE DETECTED  LABBENZ NONE DETECTED  AMPHETMU NONE DETECTED  THCU NONE DETECTED  LABBARB NONE DETECTED    Alcohol Level  Recent Labs  Lab 03/20/21 1407  ETH <10    IMAGING past 24 hours CT ANGIO HEAD W OR WO CONTRAST  Result Date: 03/20/2021 CLINICAL DATA:  Acute neuro deficit.  Speech difficulty EXAM: CT ANGIOGRAPHY HEAD AND NECK TECHNIQUE: Multidetector CT imaging of the head and neck was performed using the standard protocol during bolus administration of intravenous contrast. Multiplanar CT image reconstructions and MIPs were obtained to evaluate the  vascular anatomy. Carotid stenosis measurements (when applicable) are obtained utilizing NASCET criteria, using the distal internal carotid diameter as the denominator. CONTRAST:  4mL OMNIPAQUE IOHEXOL 350 MG/ML SOLN COMPARISON:  CT head 02/25/2021.  MRI head 03/20/2021 FINDINGS: CT HEAD FINDINGS Brain: Ill-defined hypodensity in the left mid frontal lobe involving cortex and white matter. This is consistent with acute infarct which shows restricted diffusion. No associated hemorrhage No other acute infarct, hemorrhage, mass.  Ventricle size normal Vascular: Negative for hyperdense vessel Skull: Negative Sinuses: Negative Orbits: Negative Review of the MIP images confirms the above findings CTA NECK FINDINGS Aortic arch: Bovine branching arch. Mild atherosclerotic disease proximal great vessels without stenosis Right carotid system: Right vertebral artery widely patent without stenosis. Minimal atherosclerotic disease right carotid bifurcation Left carotid system: No significant left carotid stenosis. No significant atherosclerotic disease Vertebral arteries: Both vertebral arteries widely patent to the basilar without stenosis. Skeleton: No acute skeletal abnormality. Other neck: 24 mm right thyroid nodule. No enlarged lymph nodes in the neck. Upper chest: Lung apices clear bilaterally. Review of the MIP images confirms the above findings CTA HEAD FINDINGS Anterior circulation: Cavernous carotid widely patent bilaterally. Anterior and middle cerebral arteries patent bilaterally. Left M1 segment widely  patent. Decreased caliber and irregularity of 2 branches in the superior division of the left MCA likely due to thrombus with recent infarct in the left frontal lobe. This is best seen on series 17, image 25. There is distal occlusion of these vessels. Both anterior cerebral arteries widely patent. Right middle cerebral artery widely patent. Posterior circulation: Both vertebral arteries widely patent to the basilar.  PICA patent bilaterally. Basilar widely patent. Superior cerebellar and posterior cerebral arteries patent without stenosis Venous sinuses: Normal venous enhancement. Anatomic variants: None Review of the MIP images confirms the above findings IMPRESSION: 1. Hypodensity left frontal lobe compatible with acute infarct as identified on MRI today. There is small caliber and irregularity of 2 branches supplying this area involving the superior division left MCA compatible with thrombus, with distal occlusion. 2. Otherwise no intracranial stenosis 3. No extracranial stenosis in the carotid or vertebral arteries. 4. Code stroke imaging results were communicated on 03/20/2021 at 3:39 pm to provider Rory Percy via text page 5. 24 mm right thyroid nodule. Refer to recommendations on thyroid ultrasound 01/07/2021 Electronically Signed   By: Franchot Gallo M.D.   On: 03/20/2021 15:43   CT ANGIO NECK W OR WO CONTRAST  Result Date: 03/20/2021 CLINICAL DATA:  Acute neuro deficit.  Speech difficulty EXAM: CT ANGIOGRAPHY HEAD AND NECK TECHNIQUE: Multidetector CT imaging of the head and neck was performed using the standard protocol during bolus administration of intravenous contrast. Multiplanar CT image reconstructions and MIPs were obtained to evaluate the vascular anatomy. Carotid stenosis measurements (when applicable) are obtained utilizing NASCET criteria, using the distal internal carotid diameter as the denominator. CONTRAST:  4mL OMNIPAQUE IOHEXOL 350 MG/ML SOLN COMPARISON:  CT head 02/25/2021.  MRI head 03/20/2021 FINDINGS: CT HEAD FINDINGS Brain: Ill-defined hypodensity in the left mid frontal lobe involving cortex and white matter. This is consistent with acute infarct which shows restricted diffusion. No associated hemorrhage No other acute infarct, hemorrhage, mass.  Ventricle size normal Vascular: Negative for hyperdense vessel Skull: Negative Sinuses: Negative Orbits: Negative Review of the MIP images confirms the  above findings CTA NECK FINDINGS Aortic arch: Bovine branching arch. Mild atherosclerotic disease proximal great vessels without stenosis Right carotid system: Right vertebral artery widely patent without stenosis. Minimal atherosclerotic disease right carotid bifurcation Left carotid system: No significant left carotid stenosis. No significant atherosclerotic disease Vertebral arteries: Both vertebral arteries widely patent to the basilar without stenosis. Skeleton: No acute skeletal abnormality. Other neck: 24 mm right thyroid nodule. No enlarged lymph nodes in the neck. Upper chest: Lung apices clear bilaterally. Review of the MIP images confirms the above findings CTA HEAD FINDINGS Anterior circulation: Cavernous carotid widely patent bilaterally. Anterior and middle cerebral arteries patent bilaterally. Left M1 segment widely patent. Decreased caliber and irregularity of 2 branches in the superior division of the left MCA likely due to thrombus with recent infarct in the left frontal lobe. This is best seen on series 17, image 25. There is distal occlusion of these vessels. Both anterior cerebral arteries widely patent. Right middle cerebral artery widely patent. Posterior circulation: Both vertebral arteries widely patent to the basilar. PICA patent bilaterally. Basilar widely patent. Superior cerebellar and posterior cerebral arteries patent without stenosis Venous sinuses: Normal venous enhancement. Anatomic variants: None Review of the MIP images confirms the above findings IMPRESSION: 1. Hypodensity left frontal lobe compatible with acute infarct as identified on MRI today. There is small caliber and irregularity of 2 branches supplying this area involving the superior division left MCA  compatible with thrombus, with distal occlusion. 2. Otherwise no intracranial stenosis 3. No extracranial stenosis in the carotid or vertebral arteries. 4. Code stroke imaging results were communicated on 03/20/2021 at 3:39  pm to provider Rory Percy via text page 5. 24 mm right thyroid nodule. Refer to recommendations on thyroid ultrasound 01/07/2021 Electronically Signed   By: Franchot Gallo M.D.   On: 03/20/2021 15:43   CT CEREBRAL PERFUSION W CONTRAST  Result Date: 03/20/2021 EXAM: CT PERFUSION BRAIN TECHNIQUE: Multiphase CT imaging of the brain was performed following IV bolus contrast injection. Subsequent parametric perfusion maps were calculated using RAPID software. CONTRAST:  30mL OMNIPAQUE IOHEXOL 350 MG/ML SOLN COMPARISON:  Same day CTA and MRI FINDINGS: CT Brain Perfusion Findings: CBF (<30%) Volume: 50mL Perfusion (Tmax>6.0s) volume: 15ML Mismatch Volume: 17mL Infarct Core: 0 mL Infarction Location:None identified. IMPRESSION: RAPID calculates an approximately 15 mL area of mismatch in the left frontal lobe; however, given that this area is relatively similar in size/extent to the hypodensity seen on same day CTA the penumbra is favored to be less than 15 mL and probably not significant. Findings discussed with Dr. Rory Percy at 4:47 p.m. Electronically Signed   By: Margaretha Sheffield MD   On: 03/20/2021 16:54    PHYSICAL EXAM HEENT-  [place]/AT  Lungs- Respirations unlabored Extremities- No edema  Neurological Examination Mental Status: Alert, oriented x 5, thought content appropriate.  Speech with mild dysfluency manifested as intermittent pausing during speech, which patient describes as "having trouble finding the words" to make sentences as well as trying to use other ways to answer questions because " I can't find the right word. I know it, but I can't get it out.".Repetition intact. Comprehension intact. Dyscalculia is noted knows there are 4 quarters in$1 but has significant issues with the same for $ 2.75 and does not appear to be able to answer in quarters anymore despite really trying to slow himself down. Patient attempts to do the mouth out loud which he starts correctly but arriving at the actual answer from  the calc is not possible at this time. No dysarthria.  Cranial Nerves: II: Temporal visual fields intact without extinction to DSS. PERRL. 44mm pupils bil.   III,IV, VI: No ptosis. EOMI. No nystagmus.   V,VII: Smile symmetric, facial temp sensation equal bilaterally VIII: Hearing intact to voice IX,X: Palate rises symmetrically XI: Symmetric shoulder shrug XII: Midline tongue extension Motor: Right :  Upper extremity   5/5                                      Left:     Upper extremity   5/5             Lower extremity   5/5                                                  Lower extremity   5/5 Normal tone throughout; no atrophy noted Sensory: Temp and light touch intact throughout, bilaterally. No extinction to DSS.  Deep Tendon Reflexes: 2+ and symmetric throughout Plantars: Right: downgoing                           Left: downgoing Cerebellar: No ataxia  with FTN bilaterally Gait: Deferred  ASSESSMENT/PLAN [name] is a 50 y.o. male with history of antiphospholipid syndrome, DVT, CKD, HTN, lupus, HLD, obesity, untreated OSA (waiting to be started on home CPAP) and PE (on chronic Lovenox) who presented to the ED with expressive dysphasia. On exam patient's continues to have mild difficulties with word finding but is overall coherent. Patient also has dyscalculia but no other neurodefecits. Patient confirmed that he had been taking his Lovenox as prescribed. Due to patient's antiphospholipid syndrome and Lupus and hx of DVT and PE patient will continue to need DVT ppx. Patient MRI confirms that patient had a CVA od the L frontal cortex and operculum in Broca's area which matches patient's systems. Patient stroke is likely 2/2 to his hypercoagulable state despite being on Lovenox. Will have to discontinue Lovenox home medication as it is likely a failed treatment and transition from current IV heparin to Warfarin. Patient can be better titrated on warfarin. Will still do a stroke  workup on patient.   Stroke: Acute the left frontal infarct likely 2/2 antiphospholipid syndrome failed Lovenox injection  CT Head:  Negative head CT.  CTA head & neck  1. Hypodensity left frontal lobe compatible with acute infarct as identified on MRI today. There is small caliber and irregularity of 2 branches supplying this area involving the superior division left MCA compatible with thrombus, with distal occlusion. 2. Otherwise no intracranial stenosis 3. No extracranial stenosis in the carotid or vertebral arteries. 4. Code stroke imaging results were communicated on 03/20/2021 at 3:39 pm to provider Rory Percy via text page 5. 24 mm right thyroid nodule. Refer to recommendations on thyroid ultrasound 01/07/2021  CT perfusion  RAPID calculates an approximately 15 mL area of mismatch in the left frontal lobe; however, given that this area is relatively similar in size/extent to the hypodensity seen on same day CTA the penumbra is favored to be less than 15 mL and probably not significant.  MRI :  IMPRESSION: Acute infarct involving the left frontal cortex and operculum. Mild associated edema without mass effect.  2D Echo pending  LDL 234  HgbA1c 6.0  VTE prophylaxis - IV heparin    Diet   Diet Heart Room service appropriate? Yes; Fluid consistency: Thin     Lovenox prior to admission, now on heparin IV. Consult to Pharm to transition to Warfarin with goal INR 2.5-3.5 with heparin IV bridge.    Therapy recommendations:  PT/OT . No OP recc's  Disposition:  Home  Lupus with antiphospholipid syndrome History of a DVT and PE Hypercoagulable state  Follows with rheumatology  Also has appointment with hematology  On Lovenox injection twice daily PTA  Now on heparin IV  Recommend warfarin with goal INR 2.5-3.5 bridged with IV heparin  Hyperlipidemia  Home meds: rosuvastatin 20mg , zetia 10mg   LDL 234, goal < 70  Increased rosuvastatin to 40mg   Continue  home Zetia  Continue statin at discharge  Other Stroke Risk Factors  hypercoagulable state  Obstructive sleep apnea, not on CPAP at home  Hospital day # 1  Damita Dunnings, MD PGY-1  ATTENDING NOTE: I reviewed above note and agree with the assessment and plan. Pt was seen and examined.   50 year old male with history of lupus, antiphospholipid syndrome, DVT/PE, CKD, HTN, HLD, OSA, obesity admitted for aphasia.  Currently symptoms much improved, only has slight word finding difficulty intermittently.  Otherwise neuro stable.  MRI showed left frontal Broca area infarct.  CTA head and neck  showed distal M2 occlusion.  2D echo pending.  LDL 234, A1c 6.0.  Etiology for patient stroke likely due to hypercoagulable state from antiphospholipid syndrome, but apparently failed Lovenox therapeutic injection.  Currently on heparin IV.  Discussed with patient extensively, recommend Coumadin with INR goal 2.5-3.5.  Okay to bridge with heparin IV for couple days and then bridged with Lovenox on discharge if needed.  Patient has rheumatologist to follow, and also has hematology appointment next week.  He will also follow-up with neurology stroke clinic.  For detailed assessment and plan, please refer to above as I have made changes wherever appropriate.   Neurology will sign off. Please call with questions. Pt will follow up with stroke clinic NP at [facility] in about 4 weeks. Thanks for the consult.   Rosalin Hawking, MD PhD Stroke Neurology 03/21/2021 8:18 PM     To contact Stroke Continuity provider, please refer to [email or web address] After hours, contact General Neurology

## 2021-03-21 NOTE — Progress Notes (Signed)
[place] for Heparin Indication: stroke  Allergies  Allergen Reactions  . Diltiazem Hcl Diarrhea and Other (See Comments)    Lethargic    Patient Measurements: Height: 6\' 2"  (188 cm) Weight: 108.9 kg (240 lb) IBW/kg (Calculated) : 82.2 Heparin Dosing Weight: 104.6 kg  Vital Signs: Temp: 97.7 F (36.5 C) (04/22 0312) Temp Source: Oral (04/22 0312) BP: 156/84 (04/22 0312) Pulse Rate: 75 (04/22 0312)  Labs: Recent Labs    03/20/21 1210 03/20/21 1407 03/21/21 0550  HGB 15.4  --  14.6  HCT 46.9  --  45.3  PLT 286  --  263  APTT  --  44*  --   LABPROT  --  12.3  --   INR  --  0.9  --   HEPARINUNFRC  --   --  0.12*  CREATININE 1.17  --   --     Estimated Creatinine Clearance: 101.5 mL/min (by C-G formula based on SCr of 1.17 mg/dL).   Medical History: Past Medical History:  Diagnosis Date  . Acute nonintractable headache 09/06/2019  . Acute respiratory failure with hypoxia ([place]) 08/30/2019  . AKI (acute kidney injury) ([place]) 08/30/2019  . Antiphospholipid syndrome ([place]) 10/30/2019  . Anxiety   . APS (antiphospholipid syndrome) ([place])   . Arthritis   . Colon polyp    ? hyperplastic  . Depression   . Diarrhea 05/21/2020  . DVT (deep venous thrombosis) ([place])   . Dyspnea on exertion 09/08/2017   "Quit smoking" 2015 with onset of symptoms in 2016  Spirometry 09/08/2017  Flat f/v loop  - d/c acei 09/08/2017  - 01/04/2020   Walked RA x two laps =  approx 590ft @ fast pace - stopped due to end of study/ min sob with sats of 93 % at the end of the study. - PFT's  03/08/20   FEV1 3.84 (83 % ) ratio 0..90  p 3 % improvement from saba p ? prior to study with DLCO  26.46 (77%) corrects to 4.76 (99%)    . Esophageal spasm   . Essential hypertension 09/23/2014   D/c acei 09/08/2017 due to pseudocopd   . GAD (generalized anxiety disorder) 08/06/2016  . Lobar pneumonia ([place]) 08/30/2019  . Lung nodule 09/21/2014  . Lupus ([place])   . Mixed  hyperlipidemia 09/18/2015  . Morbid obesity due to excess calories ([place]) c/b hbp/ dvt/PE 09/10/2019  . 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 Nutcracker esophagus   . Paresthesia of right foot 11/08/2019  . Pulmonary infiltrates 09/10/2019   In setting of bilateral PE 08/23/2019 with antiphospholipid syndrome   . Recurrent pneumonia 07/09/2020   Formatting of this note might be different from the original. 06/26/20, 03/08/20  . Snoring 07/09/2020  . Upper airway cough syndrome 01/05/2020   Onset mid Jan 2021 while on otc PPI  - max rx for gerd 01/04/2020 >>>     . Witnessed apneic spells 07/09/2020   Medications:  Scheduled:  Infusions:   Assessment: 49 yoM to ED with mild word-finding difficulty, no other deficit. Hx of Anti-phospholipid Ab syndrome on Lovenox 120mg  SQ bid - Last dose 4/21 at 0645 am Per Neuro note: Plan CT perfusion study - if huge penumbra consider thrombectomy. Begin Heparin infusion, no bolus, and  transfer to [facility].   4/22 AM update:  Heparin level low   Goal of Therapy:  Heparin level 0.3-0.5 units/ml Monitor platelets by anticoagulation protocol: Yes   Plan:  No boluses Inc heparin to 1450 units/hr 1500 heparin level  Narda Bonds, PharmD, [place] Pharmacist Phone: [phone]

## 2021-03-21 NOTE — Evaluation (Addendum)
Occupational Therapy Evaluation Patient Details Name: Wesley Hancock MRN: [id number] DOB: 1971/10/23 Today's Date: 03/21/2021    History of Present Illness 50 y.o. male who presented to the ED with expressive dysphasia. MRI brain revealed an acute left frontal operculum ischemic infarction. PMHx of antiphospholipid syndrome, DVT, CKD, HTN, lupus, HLD, obesity, untreated OSA (waiting to be started on home CPAP) and PE (on chronic Lovenox).   Clinical Impression   PTA, pt was living with his wife and was independent; works as a Conservation officer, historic buildings. Pt currently performing ADLs and functional mobility at Mod I-Independent level. Pt presenting with word finding difficulties and problem solving deficits impacting executive functioning. Recommend dc to home once medically stable per physician. Feel that higher cognitive deficits may be addressed at OP SLP along side language deficits. All acute OT needs met and will sign off. Thank you.   Follow Up Recommendations  Other (comment) (OP SLP)    Equipment Recommendations  None recommended by OT    Recommendations for Other Services Speech consult     Precautions / Restrictions Precautions Precautions: None      Mobility Bed Mobility Overal bed mobility: Independent                  Transfers Overall transfer level: Independent Equipment used: None                  Balance Overall balance assessment: Independent                                         ADL either performed or assessed with clinical judgement   ADL Overall ADL's : Modified independent                                       General ADL Comments: Pt performing ADLs and functional mobility with increased time as needed.     Vision Baseline Vision/History: Wears glasses Wears Glasses: At all times Patient Visual Report: No change from baseline       Perception     Praxis      Pertinent Vitals/Pain Pain Assessment:  No/denies pain     Hand Dominance Right   Extremity/Trunk Assessment Upper Extremity Assessment Upper Extremity Assessment: Overall WFL for tasks assessed   Lower Extremity Assessment Lower Extremity Assessment: Overall WFL for tasks assessed   Cervical / Trunk Assessment Cervical / Trunk Assessment: Normal   Communication Communication Communication: Expressive difficulties   Cognition Arousal/Alertness: Awake/alert Behavior During Therapy: WFL for tasks assessed/performed Overall Cognitive Status: Impaired/Different from baseline Area of Impairment: Memory;Problem solving                     Memory: Decreased short-term memory       Problem Solving: Difficulty sequencing General Comments: Intermittent difficulty with word finding; for example, naming a "fork" as a spoon and requiring cues to correct. Higher level cognitive deficits. Able to name 2/3 animals that start with "F" with increased time; naming a third with Min cues. Difficulty with simple money management questions; requiring increased time and cues for completing task successfully. Pt reporting this is not his normal. Very motivated   General Comments       Exercises     Shoulder Instructions      Home Living Family/patient expects  to be discharged to:: Private residence Living Arrangements: Spouse/significant other;Children Available Help at Discharge: Family;Available 24 hours/day Type of Home: House Home Access: Stairs to enter [employer] of Steps: 1   Home Layout: One level     Bathroom Shower/Tub: Teacher, early years/pre: Standard     Home Equipment: Shower seat          Prior Functioning/Environment Level of Independence: Independent        Comments: Works as a Therapist, occupational Problem List: Decreased cognition      OT Treatment/Interventions:      OT Goals(Current goals can be found in the care plan section) Acute Rehab OT  Goals Patient Stated Goal: Go home OT Goal Formulation: All assessment and education complete, DC therapy  OT Frequency:     Barriers to D/C:            Co-evaluation PT/OT/SLP Co-Evaluation/Treatment: Yes Reason for Co-Treatment: For patient/therapist safety;To address functional/ADL transfers   OT goals addressed during session: ADL's and self-care      AM-PAC OT "6 Clicks" Daily Activity     Outcome Measure Help from another person eating meals?: None Help from another person taking care of personal grooming?: None Help from another person toileting, which includes using toliet, bedpan, or urinal?: None Help from another person bathing (including washing, rinsing, drying)?: None Help from another person to put on and taking off regular upper body clothing?: None Help from another person to put on and taking off regular lower body clothing?: None 6 Click Score: 24   End of Session Nurse Communication: Mobility status  Activity Tolerance: Patient tolerated treatment well Patient left: in bed;with call bell/phone within reach  OT Visit Diagnosis: Unsteadiness on feet (R26.81);Other abnormalities of gait and mobility (R26.89);Muscle weakness (generalized) (M62.81);Other symptoms and signs involving cognitive function                Time: 7955-8316 OT Time Calculation (min): 15 min Charges:  OT General Charges $OT Visit: 1 Visit OT Evaluation $OT Eval Low Complexity: [place], OTR/L Acute Rehab Pager: [phone] Office: [place] 03/21/2021, 11:22 AM

## 2021-03-21 NOTE — Progress Notes (Addendum)
[place] for Heparin/warfarin Indication: stroke, antiphospholipid antibody syndrome  Allergies  Allergen Reactions  . Diltiazem Hcl Diarrhea and Other (See Comments)    Lethargic    Patient Measurements: Height: 6\' 2"  (188 cm) Weight: 108.9 kg (240 lb) IBW/kg (Calculated) : 82.2 Heparin Dosing Weight: 104.6 kg  Vital Signs: Temp: 97.5 F (36.4 C) (04/22 1310) Temp Source: Oral (04/22 1310) BP: 147/96 (04/22 1310) Pulse Rate: 82 (04/22 1310)  Labs: Recent Labs    03/20/21 1210 03/20/21 1407 03/21/21 0550 03/21/21 1504  HGB 15.4  --  14.6  --   HCT 46.9  --  45.3  --   PLT 286  --  263  --   APTT  --  44*  --   --   LABPROT  --  12.3  --   --   INR  --  0.9  --   --   HEPARINUNFRC  --   --  0.12* 0.15*  CREATININE 1.17  --   --   --     Estimated Creatinine Clearance: 101.5 mL/min (by C-G formula based on SCr of 1.17 mg/dL).   Medical History: Past Medical History:  Diagnosis Date  . Acute nonintractable headache 09/06/2019  . Acute respiratory failure with hypoxia ([place]) 08/30/2019  . AKI (acute kidney injury) ([place]) 08/30/2019  . Antiphospholipid syndrome ([place]) 10/30/2019  . Anxiety   . APS (antiphospholipid syndrome) ([place])   . Arthritis   . Colon polyp    ? hyperplastic  . Depression   . Diarrhea 05/21/2020  . DVT (deep venous thrombosis) ([place])   . Dyspnea on exertion 09/08/2017   "Quit smoking" 2015 with onset of symptoms in 2016  Spirometry 09/08/2017  Flat f/v loop  - d/c acei 09/08/2017  - 01/04/2020   Walked RA x two laps =  approx 582ft @ fast pace - stopped due to end of study/ min sob with sats of 93 % at the end of the study. - PFT's  03/08/20   FEV1 3.84 (83 % ) ratio 0..90  p 3 % improvement from saba p ? prior to study with DLCO  26.46 (77%) corrects to 4.76 (99%)    . Esophageal spasm   . Essential hypertension 09/23/2014   D/c acei 09/08/2017 due to pseudocopd   . GAD (generalized anxiety disorder) 08/06/2016   . Lobar pneumonia ([place]) 08/30/2019  . Lung nodule 09/21/2014  . Lupus ([place])   . Mixed hyperlipidemia 09/18/2015  . Morbid obesity due to excess calories ([place]) c/b hbp/ dvt/PE 09/10/2019  . 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 Nutcracker esophagus   . Paresthesia of right foot 11/08/2019  . Pulmonary infiltrates 09/10/2019   In setting of bilateral PE 08/23/2019 with antiphospholipid syndrome   . Recurrent pneumonia 07/09/2020   Formatting of this note might be different from the original. 06/26/20, 03/08/20  . Snoring 07/09/2020  . Upper airway cough syndrome 01/05/2020   Onset mid Jan 2021 while on otc PPI  - max rx for gerd 01/04/2020 >>>     . Witnessed apneic spells 07/09/2020    Assessment: 74 yoM to ED with mild word-finding difficulty, no other deficit. Hx of Anti-phospholipid Ab syndrome on Lovenox 120mg  SQ bid - Last dose 4/21 at  0645 am Per Neuro note: Plan CT perfusion study - if huge penumbra consider thrombectomy. Began Heparin infusion, no bolus, and transferred to [facility] 03/20/21.   Neurology noted lovenox is likely a failed treatment and transitioning IV heparin to warfarin per pharmacy 4/22. Baseline INR 0.9, CBC wnl. No bleeding reported.  PM update - heparin level remains low, increased to 0.15 this afternoon after rate increase earlier today. No bleeding or issues with infusion per discussion with RN. Warfarin 7.5mg  dose given today.   Goal of Therapy:  Heparin level 0.3-0.5 units/ml  INR 2.5-3.5 (goal per discussion with Dr. Erlinda Hong of Neurology) Monitor platelets by anticoagulation protocol: Yes   Plan:  Increase heparin drip to 1650 units/hr Check 6hr heparin level Monitor daily INR, CBC, s/sx bleeding   Arturo Morton, PharmD, BCPS Please check AMION for all [place] contact  numbers Clinical Pharmacist 03/21/2021 4:38 PM

## 2021-03-21 NOTE — Progress Notes (Signed)
[place] for Heparin and  Warfarin  Indication: stroke  Allergies  Allergen Reactions  . Diltiazem Hcl Diarrhea and Other (See Comments)    Lethargic    Patient Measurements: Height: 6\' 2"  (188 cm) Weight: 108.9 kg (240 lb) IBW/kg (Calculated) : 82.2 Heparin Dosing Weight: 104.6 kg  Vital Signs: Temp: 97.5 F (36.4 C) (04/22 1310) Temp Source: Oral (04/22 1310) BP: 147/96 (04/22 1310) Pulse Rate: 82 (04/22 1310)  Labs: Recent Labs    03/20/21 1210 03/20/21 1407 03/21/21 0550  HGB 15.4  --  14.6  HCT 46.9  --  45.3  PLT 286  --  263  APTT  --  44*  --   LABPROT  --  12.3  --   INR  --  0.9  --   HEPARINUNFRC  --   --  0.12*  CREATININE 1.17  --   --     Estimated Creatinine Clearance: 101.5 mL/min (by C-G formula based on SCr of 1.17 mg/dL).   Medical History: Past Medical History:  Diagnosis Date  . Acute nonintractable headache 09/06/2019  . Acute respiratory failure with hypoxia ([place]) 08/30/2019  . AKI (acute kidney injury) ([place]) 08/30/2019  . Antiphospholipid syndrome ([place]) 10/30/2019  . Anxiety   . APS (antiphospholipid syndrome) ([place])   . Arthritis   . Colon polyp    ? hyperplastic  . Depression   . Diarrhea 05/21/2020  . DVT (deep venous thrombosis) ([place])   . Dyspnea on exertion 09/08/2017   "Quit smoking" 2015 with onset of symptoms in 2016  Spirometry 09/08/2017  Flat f/v loop  - d/c acei 09/08/2017  - 01/04/2020   Walked RA x two laps =  approx 560ft @ fast pace - stopped due to end of study/ min sob with sats of 93 % at the end of the study. - PFT's  03/08/20   FEV1 3.84 (83 % ) ratio 0..90  p 3 % improvement from saba p ? prior to study with DLCO  26.46 (77%) corrects to 4.76 (99%)    . Esophageal spasm   . Essential hypertension 09/23/2014   D/c acei 09/08/2017 due to pseudocopd   . GAD (generalized anxiety disorder) 08/06/2016  . Lobar pneumonia ([place]) 08/30/2019  . Lung nodule 09/21/2014  . Lupus ([place])   .  Mixed hyperlipidemia 09/18/2015  . Morbid obesity due to excess calories ([place]) c/b hbp/ dvt/PE 09/10/2019  . 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 Nutcracker esophagus   . Paresthesia of right foot 11/08/2019  . Pulmonary infiltrates 09/10/2019   In setting of bilateral PE 08/23/2019 with antiphospholipid syndrome   . Recurrent pneumonia 07/09/2020   Formatting of this note might be different from the original. 06/26/20, 03/08/20  . Snoring 07/09/2020  . Upper airway cough syndrome 01/05/2020   Onset mid Jan 2021 while on otc PPI  - max rx for gerd 01/04/2020 >>>     . Witnessed apneic spells 07/09/2020   Medications:  Scheduled:  Infusions:   Assessment: 49 yoM to ED with mild word-finding difficulty, no other deficit. Hx of Anti-phospholipid Ab syndrome on Lovenox 120mg  SQ bid - Last dose 4/21 at 0645 am Per Neuro note: Plan CT perfusion study - if huge penumbra consider thrombectomy. Began Heparin  infusion, no bolus, and transferred to [facility] 03/20/21   4/22 AM update: Heparin level was subtherapeutic,  Thus Heparin drip rate increase to 1450 units/hr Next 6-8 hour heparin level is due today at 15:00 (not drawn yet)   PTA Lovenox , pateint confirmed taking as prescribed.  Neurology has discontinued  Lovenox home medication noting that lovenox is likely a failed treatment and plans to  transition from current IV heparin to Warfarin.  Neuro has consulted pharmacy to start Warfarin today 03/21/21  Baseline INR 0.9, CBC wnl. No bleeding reported. Warfarin point score=9 points. I will start with lower warfarin  dose than his warfarin point score recommends due to acute CVA.    Goal of Therapy:  Heparin level 0.3-0.5 units/ml  INR 2-3 Monitor platelets by anticoagulation protocol: Yes   Plan:  Give Warfarin  7.5 mg today x1 Daily PT/INR, HL ,and CBC Heparin continues at 1450 units/hr Heparin level due at 15:00 todfay No boluses  Thank you for allowing pharmacy to be part of this patients care team. Nicole Cella, [place] Pharmacist [phone] Please check AMION for all [place] phone numbers After 10:00 PM, call [place]

## 2021-03-21 NOTE — Evaluation (Signed)
Physical Therapy Evaluation Patient Details Name: Wesley Hancock MRN: [id number] DOB: 16-Jun-1971 Today's Date: 03/21/2021   History of Present Illness  50 y.o. male with a PMHx of antiphospholipid syndrome, DVT, CKD, HTN, lupus, HLD, obesity, untreated OSA (waiting to be started on home CPAP) and PE (on chronic Lovenox) who presented to the ED with expressive dysphasia. MRI brain revealed an acute left frontal operculum ischemic infarction.    Clinical Impression  PT eval complete. Pt is independent with all functional mobility. No deficits noted in balance or strength. No further PT intervention indicated. PT signing off.    Follow Up Recommendations No PT follow up    Equipment Recommendations  None recommended by PT    Recommendations for Other Services       Precautions / Restrictions Precautions Precautions: None      Mobility  Bed Mobility Overal bed mobility: Independent                  Transfers Overall transfer level: Independent Equipment used: None                Ambulation/Gait Ambulation/Gait assistance: Independent Gait Distance (Feet): 500 Feet Assistive device: None Gait Pattern/deviations: WFL(Within Functional Limits)   Gait velocity interpretation: >2.62 ft/sec, indicative of community ambulatory    Stairs Stairs: Yes Stairs assistance: Independent Stair Management: No rails Number of Stairs: 5    Wheelchair Mobility    Modified Rankin (Stroke Patients Only) Modified Rankin (Stroke Patients Only) Pre-Morbid Rankin Score: No symptoms Modified Rankin: No significant disability     Balance Overall balance assessment: Independent                                           Pertinent Vitals/Pain Pain Assessment: No/denies pain    Home Living Family/patient expects to be discharged to:: Private residence Living Arrangements: Spouse/significant other;Children Available Help at Discharge:  Family;Available 24 hours/day Type of Home: House Home Access: Stairs to enter   [employer] of Steps: 1 Home Layout: One level Home Equipment: Shower seat      Prior Function Level of Independence: Independent         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   Dominant Hand: Right    Extremity/Trunk Assessment   Upper Extremity Assessment Upper Extremity Assessment: Overall WFL for tasks assessed    Lower Extremity Assessment Lower Extremity Assessment: Overall WFL for tasks assessed    Cervical / Trunk Assessment Cervical / Trunk Assessment: Normal  Communication   Communication: Expressive difficulties  Cognition Arousal/Alertness: Awake/alert Behavior During Therapy: WFL for tasks assessed/performed Overall Cognitive Status: Impaired/Different from baseline Area of Impairment: Memory;Problem solving                     Memory: Decreased short-term memory       Problem Solving: Difficulty sequencing General Comments: intermittent difficulty with word finding. [place]iger level cognitive deficits.      General Comments      Exercises     Assessment/Plan    PT Assessment Patent does not need any further PT services  PT Problem List         PT Treatment Interventions      PT Goals (Current goals can be found in the Care Plan section)  Acute Rehab PT Goals Patient Stated Goal: home PT Goal  Formulation: All assessment and education complete, DC therapy    Frequency     Barriers to discharge        Co-evaluation   Reason for Co-Treatment: Other (comment);To address functional/ADL transfers (for timing)   OT goals addressed during session: ADL's and self-care       AM-PAC PT "6 Clicks" Mobility  Outcome Measure Help needed turning from your back to your side while in a flat bed without using bedrails?: None Help needed moving from lying on your back to sitting on the side of a flat bed without using  bedrails?: None Help needed moving to and from a bed to a chair (including a wheelchair)?: None Help needed standing up from a chair using your arms (e.g., wheelchair or bedside chair)?: None Help needed to walk in hospital room?: None Help needed climbing 3-5 steps with a railing? : None 6 Click Score: 24    End of Session   Activity Tolerance: Patient tolerated treatment well Patient left: in bed;with call bell/phone within reach Nurse Communication: Mobility status PT Visit Diagnosis: Difficulty in walking, not elsewhere classified (R26.2)    Time: 0940-1000 PT Time Calculation (min) (ACUTE ONLY): 20 min   Charges:   PT Evaluation $PT Eval Low Complexity: 1 Low          Lorrin Goodell, PT  Office # [phone] Pager [phone]   Lorriane Shire 03/21/2021, 10:41 AM

## 2021-03-21 NOTE — TOC Initial Note (Signed)
Transition of Care [facility]) - Initial/Assessment Note    Patient Details  Name: Wesley Hancock MRN: [id number] Date of Birth: [birth year]  Transition of Care [facility]) CM/SW Contact:    Pollie Friar, RN Phone Number: 03/21/2021, 1:41 PM  Clinical Narrative:                 Recommendations are for outpatient ST. Pt is interested in attending [facility]. Orders in Epic and information on the AVS.  Pt has needed transport at home and when d/ced.   Expected Discharge Plan: OP Rehab Barriers to Discharge: Continued Medical Work up   Patient Goals and CMS Choice     Choice offered to / list presented to : Patient  Expected Discharge Plan and Services Expected Discharge Plan: OP Rehab   Discharge Planning Services: CM Consult   Living arrangements for the past 2 months: Single Family Home                                      Prior Living Arrangements/Services Living arrangements for the past 2 months: Single Family Home Lives with:: Spouse Patient language and need for interpreter reviewed:: Yes Do you feel safe going back to the place where you live?: Yes      Need for Family Participation in Patient Care: Yes (Comment) Care giver support system in place?: Yes (comment)   Criminal Activity/Legal Involvement Pertinent to Current Situation/Hospitalization: No - Comment as needed  Activities of Daily Living Home Assistive Devices/Equipment: Eyeglasses,Other (Comment) (pulse oximeter) ADL Screening (condition at time of admission) Patient's cognitive ability adequate to safely complete daily activities?: Yes Is the patient deaf or have difficulty hearing?: No Does the patient have difficulty seeing, even when wearing glasses/contacts?: No Does the patient have difficulty concentrating, remembering, or making decisions?: No Patient able to express need for assistance with ADLs?: Yes Does the patient have difficulty dressing or bathing?: No Independently  performs ADLs?: Yes (appropriate for developmental age) Does the patient have difficulty walking or climbing stairs?: Yes (secondary to shortness of breath) Weakness of Legs: Both Weakness of Arms/Hands: None  Permission Sought/Granted                  Emotional Assessment Appearance:: Appears stated age Attitude/Demeanor/Rapport: Engaged Affect (typically observed): Accepting Orientation: : Oriented to Self,Oriented to Place,Oriented to  Time,Oriented to Situation   Psych Involvement: No (comment)  Admission diagnosis:  CVA (cerebral vascular accident) ([place]) [I63.9] Stroke [facility]) [I63.9] Acute CVA (cerebrovascular accident) [facility]) [I63.9] Patient Active Problem List   Diagnosis Date Noted  . CVA (cerebral vascular accident) ([place]) 03/20/2021  . Stroke ([place]) 03/20/2021  . Mixed connective tissue disease ([place]) 01/21/2021  . Multinodular goiter 01/10/2021  . OSA on CPAP 10/11/2020  . Esophageal spasm   . Colon polyp   . Arthritis   . APS (antiphospholipid syndrome) ([place])   . Anxiety   . CKD (chronic kidney disease) stage 2, GFR 60-89 ml/min 07/11/2020  . Recurrent pneumonia 07/09/2020  . Snoring 07/09/2020  . Witnessed apneic spells 07/09/2020  . Pneumonia 06/26/2020  . Diarrhea 05/21/2020  . Paresthesia of right foot 11/08/2019  . Pulmonary infiltrates 09/10/2019  . Morbid obesity due to excess calories ([place]) c/b hbp/ dvt/PE 09/10/2019  . Acute nonintractable headache 09/06/2019  . Lobar pneumonia ([place]) 08/30/2019  . Acute respiratory failure with hypoxia ([place]) 08/30/2019  . AKI (acute kidney injury) ([place]) 08/30/2019  .  DVT (deep venous thrombosis) ([place]) 08/30/2019  . Multiple pulmonary emboli ([place]) 08/22/2019  . Dyspnea on exertion 09/08/2017  . Depression 06/24/2017  . GAD (generalized anxiety disorder) 08/06/2016  . Mixed hyperlipidemia 09/18/2015  . Hypertensive heart disease 09/23/2014  . Nutcracker esophagus 09/21/2014  . Lung nodule 09/21/2014   PCP:   Inda Coke, PA Pharmacy:   [id number] - [place], [place] - [address] [place] [address] Phone: [phone] Fax: [phone]     Social Determinants of Health (SDOH) Interventions    Readmission Risk Interventions No flowsheet data found.

## 2021-03-21 NOTE — Progress Notes (Signed)
PROGRESS NOTE    Keaden Gunnoe  OJJ:[id number] DOB: 1971-07-12 DOA: 03/20/2021 PCP: Inda Coke, PA    Brief Narrative:  Mr. Archey was admitted to the hospital with a working diagnosis of acute ischemic CVA, left frontal cortex and operculum.   50 year old male past medical history for pulm embolism, antiphospholipid syndrome, chronic kidney disease, hypertension and lupus who presents with difficulty finding words.  Patient developed sudden onset of acute aphasia that prompted him to come to the hospital, no other focal neurologic deficits.  On his initial physical examination blood pressure 147/93, heart rate 94, respiratory 22, oxygen saturation 95% his lungs are clear to auscultation bilaterally, heart S1-S2, present, rhythmic, soft abdomen, no lower extremity edema, his strength was preserved all 4 extremities, positive expressive aphasia.  Sodium 135, potassium 4.2, chloride 99, bicarb 26, glucose 156, BUN 28, creatinine 1.17, white count 10.1, hemoglobin 15.4, hematocrit 46.9, platelets 286. SARS COVID-19 negative. Urinalysis specific gravity 1.040.  Brain MRI with acute infarct involving the left frontal cortex and operculum,.  Mild associated edema without mass-effect.  EKG 83 bpm, normal axis, normal intervals, sinus rhythm, q wave lead I-aVL, no significant ST segment or T wave changes.  Patient placed on heparin drip as therapeutic bridging to warfarin.   Assessment & Plan:   Principal Problem:   CVA (cerebral vascular accident) ([place]) Active Problems:   Hypertensive heart disease   GAD (generalized anxiety disorder)   CKD (chronic kidney disease) stage 2, GFR 60-89 ml/min   APS (antiphospholipid syndrome) ([place])   Mixed connective tissue disease ([place])   Stroke ([place])   1. Acute ischemic CVA, left frontal cortex and operculum.  Expressive aphasia improving but not yet back to baseline, no nausea or vomiting, no headache or dysphagia.   Continue  anticoagulation with heparin drip, as therapeutic bridge to warfarin.  Continue neuro checks per unit protocol, speech, PT and OT therapy.  Follow up echocardiogram.  GI prophylaxis with pantoprazole.   2. Lupus with positive antiphospholipid Resume mycophenilate and prednisone per home regimen.  No signs of vasculitis or lupus flare.   3. Dyslipidemia. Continue with rosuvastatin and ezetimibe.   4. Depression. Continue with escitalopram and bupropion.   5. CKD stage 2. Stable renal function with serum cr at 1,17 with K at 4,2 and serum bicarbonate at 26.  Patient tolerating po well.   Patient continue to be at high risk for worsening CVA   Status is: Inpatient  Remains inpatient appropriate because:IV treatments appropriate due to intensity of illness or inability to take PO   Dispo: The patient is from: Home              Anticipated d/c is to: Home              Patient currently is not medically stable to d/c.   Difficult to place patient No   DVT prophylaxis: Heparin/ warfarin   Code Status:   full  Family Communication:  No family at the bedside       Consultants:   Neurology    Subjective: Patient is feeling better, continue to have difficulty finding words, not yet back to baseline. No nausea or vomiting.   Objective: Vitals:   03/21/21 0006 03/21/21 0312 03/21/21 0924 03/21/21 1310  BP: (!) 150/90 (!) 156/84 136/87 (!) 147/96  Pulse: 82 75 93 82  Resp:   20 18  Temp: 98.1 F (36.7 C) 97.7 F (36.5 C) 98.5 F (36.9 C) (!) 97.5 F (36.4 C)  TempSrc: Oral Oral Oral Oral  SpO2: 97% 92% 94% 97%  Weight:      Height:        Intake/Output Summary (Last 24 hours) at 03/21/2021 1426 Last data filed at 03/21/2021 0944 Gross per 24 hour  Intake 413 ml  Output --  Net 413 ml   Filed Weights   03/20/21 1153  Weight: 108.9 kg    Examination:   General: Not in pain or dyspnea,  Neurology: Awake and alert, preserved strength and coordination, no slurred  speech.  E ENT: no pallor, no icterus, oral mucosa moist Cardiovascular: No JVD. S1-S2 present, rhythmic, no gallops, rubs, or murmurs. No lower extremity edema. Pulmonary: vesicular breath sounds bilaterally, adequate air movement, no wheezing, rhonchi or rales. Gastrointestinal. Abdomen soft and non tender Skin. No rashes Musculoskeletal: no joint deformities     Data Reviewed: I have personally reviewed following labs and imaging studies  CBC: Recent Labs  Lab 03/20/21 1210 03/21/21 0550  WBC 10.1 10.6*  NEUTROABS 9.2*  --   HGB 15.4 14.6  HCT 46.9 45.3  MCV 92.5 93.8  PLT 286 270   Basic Metabolic Panel: Recent Labs  Lab 03/20/21 1210  NA 135  K 4.2  CL 99  CO2 26  GLUCOSE 156*  BUN 28*  CREATININE 1.17  CALCIUM 9.7   GFR: Estimated Creatinine Clearance: 101.5 mL/min (by C-G formula based on SCr of 1.17 mg/dL). Liver Function Tests: Recent Labs  Lab 03/20/21 1210  AST 27  ALT 42  ALKPHOS 48  BILITOT 0.9  PROT 8.4*  ALBUMIN 4.5   No results for input(s): LIPASE, AMYLASE in the last 168 hours. No results for input(s): AMMONIA in the last 168 hours. Coagulation Profile: Recent Labs  Lab 03/20/21 1407  INR 0.9   Cardiac Enzymes: No results for input(s): CKTOTAL, CKMB, CKMBINDEX, TROPONINI in the last 168 hours. BNP (last 3 results) Recent Labs    07/12/20 0913  PROBNP <5   HbA1C: Recent Labs    03/21/21 0550  HGBA1C 6.0*   CBG: No results for input(s): GLUCAP in the last 168 hours. Lipid Profile: Recent Labs    03/21/21 0550  CHOL 325*  HDL 51  LDLCALC 234*  TRIG 202*  CHOLHDL 6.4   Thyroid Function Tests: No results for input(s): TSH, T4TOTAL, FREET4, T3FREE, THYROIDAB in the last 72 hours. Anemia Panel: No results for input(s): VITAMINB12, FOLATE, FERRITIN, TIBC, IRON, RETICCTPCT in the last 72 hours.    Radiology Studies: I have reviewed all of the imaging during this hospital visit personally     Scheduled Meds: .  buPROPion  150 mg Oral Daily  . escitalopram  20 mg Oral Daily  . ezetimibe  10 mg Oral Daily  . mycophenolate  500 mg Oral BID  . pantoprazole  40 mg Oral BID  . predniSONE  40 mg Oral Daily  . rosuvastatin  40 mg Oral Daily   Continuous Infusions: . heparin 1,450 Units/hr (03/21/21 1023)     LOS: 1 day        Enos Muhl Gerome Apley, MD

## 2021-03-22 ENCOUNTER — Other Ambulatory Visit (HOSPITAL_COMMUNITY): Payer: BC Managed Care – PPO

## 2021-03-22 ENCOUNTER — Inpatient Hospital Stay (HOSPITAL_COMMUNITY): Payer: BC Managed Care – PPO

## 2021-03-22 DIAGNOSIS — I6389 Other cerebral infarction: Secondary | ICD-10-CM

## 2021-03-22 LAB — CBC
HCT: 46.3 % (ref 39.0–52.0)
Hemoglobin: 15 g/dL (ref 13.0–17.0)
MCH: 30.1 pg (ref 26.0–34.0)
MCHC: 32.4 g/dL (ref 30.0–36.0)
MCV: 93 fL (ref 80.0–100.0)
Platelets: 280 10*3/uL (ref 150–400)
RBC: 4.98 MIL/uL (ref 4.22–5.81)
RDW: 13.7 % (ref 11.5–15.5)
WBC: 11.7 10*3/uL — ABNORMAL HIGH (ref 4.0–10.5)
nRBC: 0 % (ref 0.0–0.2)

## 2021-03-22 LAB — ANTI-JO 1 ANTIBODY, IGG: Anti JO-1: <0.2 AI (ref 0.0–0.9)

## 2021-03-22 LAB — ECHOCARDIOGRAM COMPLETE BUBBLE STUDY
AR max vel: 2.96 cm2
AV Area VTI: 3.08 cm2
AV Area mean vel: 3.12 cm2
AV Mean grad: 6 mmHg
AV Peak grad: 11.7 mmHg
Ao pk vel: 1.71 m/s
Area-P 1/2: 3.19 cm2
S' Lateral: 3.1 cm

## 2021-03-22 LAB — HEPARIN LEVEL (UNFRACTIONATED)
Heparin Unfractionated: 0.28 IU/mL — ABNORMAL LOW (ref 0.30–0.70)
Heparin Unfractionated: 0.38 IU/mL (ref 0.30–0.70)

## 2021-03-22 LAB — PROTIME-INR
INR: 0.9 (ref 0.8–1.2)
Prothrombin Time: 12.5 seconds (ref 11.4–15.2)

## 2021-03-22 LAB — EXTRACTABLE NUCLEAR ANTIGEN ANTIBODY
ENA SM Ab Ser-aCnc: <0.2 AI (ref 0.0–0.9)
Ribonucleic Protein: 1.3 AI — ABNORMAL HIGH (ref 0.0–0.9)
SSA (Ro) (ENA) Antibody, IgG: 0.4 AI (ref 0.0–0.9)
SSB (La) (ENA) Antibody, IgG: <0.2 AI (ref 0.0–0.9)
Scleroderma (Scl-70) (ENA) Antibody, IgG: <0.2 AI (ref 0.0–0.9)
ds DNA Ab: 87 IU/mL — ABNORMAL HIGH (ref 0–9)

## 2021-03-22 MED ORDER — WARFARIN SODIUM 5 MG PO TABS
10.0000 mg | ORAL_TABLET | Freq: Once | ORAL | Status: AC
  Administered 2021-03-22: 10 mg via ORAL
  Filled 2021-03-22: qty 2

## 2021-03-22 MED ORDER — WARFARIN VIDEO: Freq: Once | Status: AC

## 2021-03-22 MED ORDER — COUMADIN BOOK
Freq: Once | Status: AC
  Filled 2021-03-22: qty 1

## 2021-03-22 NOTE — Evaluation (Signed)
Speech Language Pathology Evaluation Patient Details Name: Wesley Hancock MRN: [id number] DOB: [birth year] Today's Date: 03/22/2021 Time: 7124-5809 SLP Time Calculation (min) (ACUTE ONLY): 25 min  Problem List:  Patient Active Problem List   Diagnosis Date Noted  . CVA (cerebral vascular accident) ([place]) 03/20/2021  . Stroke ([place]) 03/20/2021  . Mixed connective tissue disease ([place]) 01/21/2021  . Multinodular goiter 01/10/2021  . OSA on CPAP 10/11/2020  . Esophageal spasm   . Colon polyp   . Arthritis   . APS (antiphospholipid syndrome) ([place])   . Anxiety   . CKD (chronic kidney disease) stage 2, GFR 60-89 ml/min 07/11/2020  . Recurrent pneumonia 07/09/2020  . Snoring 07/09/2020  . Witnessed apneic spells 07/09/2020  . Pneumonia 06/26/2020  . Diarrhea 05/21/2020  . Paresthesia of right foot 11/08/2019  . Pulmonary infiltrates 09/10/2019  . Morbid obesity due to excess calories ([place]) c/b hbp/ dvt/PE 09/10/2019  . Acute nonintractable headache 09/06/2019  . Lobar pneumonia ([place]) 08/30/2019  . Acute respiratory failure with hypoxia ([place]) 08/30/2019  . AKI (acute kidney injury) ([place]) 08/30/2019  . DVT (deep venous thrombosis) ([place]) 08/30/2019  . Multiple pulmonary emboli ([place]) 08/22/2019  . Dyspnea on exertion 09/08/2017  . Depression 06/24/2017  . GAD (generalized anxiety disorder) 08/06/2016  . Mixed hyperlipidemia 09/18/2015  . Hypertensive heart disease 09/23/2014  . Nutcracker esophagus 09/21/2014  . Lung nodule 09/21/2014   Past Medical History:  Past Medical History:  Diagnosis Date  . Acute nonintractable headache 09/06/2019  . Acute respiratory failure with hypoxia ([place]) 08/30/2019  . AKI (acute kidney injury) ([place]) 08/30/2019  . Antiphospholipid syndrome ([place]) 10/30/2019  . Anxiety   . APS (antiphospholipid syndrome) ([place])   . Arthritis   . Colon polyp    ? hyperplastic  . Depression   . Diarrhea 05/21/2020  . DVT (deep venous thrombosis) ([place])   . Dyspnea  on exertion 09/08/2017   "Quit smoking" 2015 with onset of symptoms in 2016  Spirometry 09/08/2017  Flat f/v loop  - d/c acei 09/08/2017  - 01/04/2020   Walked RA x two laps =  approx 562ft @ fast pace - stopped due to end of study/ min sob with sats of 93 % at the end of the study. - PFT's  03/08/20   FEV1 3.84 (83 % ) ratio 0..90  p 3 % improvement from saba p ? prior to study with DLCO  26.46 (77%) corrects to 4.76 (99%)    . Esophageal spasm   . Essential hypertension 09/23/2014   D/c acei 09/08/2017 due to pseudocopd   . GAD (generalized anxiety disorder) 08/06/2016  . Lobar pneumonia ([place]) 08/30/2019  . Lung nodule 09/21/2014  . Lupus ([place])   . Mixed hyperlipidemia 09/18/2015  . Morbid obesity due to excess calories ([place]) c/b hbp/ dvt/PE 09/10/2019  . 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 Nutcracker esophagus   . Paresthesia of right foot 11/08/2019  . Pulmonary infiltrates 09/10/2019   In setting of bilateral PE 08/23/2019 with antiphospholipid syndrome   . Recurrent pneumonia 07/09/2020   Formatting of this note might be different from the original. 06/26/20, 03/08/20  . Snoring 07/09/2020  . Upper airway cough syndrome 01/05/2020   Onset mid Jan 2021 while on otc PPI  - max rx for gerd 01/04/2020 >>>     .  Witnessed apneic spells 07/09/2020   Past Surgical History:  Past Surgical History:  Procedure Laterality Date  . BRONCHIAL WASHINGS  01/14/2021   Procedure: BRONCHIAL WASHINGS;  Surgeon: Freddi Starr, MD;  Location: [facility];  Service: Pulmonary;;  . CHOLECYSTECTOMY    . VIDEO BRONCHOSCOPY N/A 01/14/2021   Procedure: VIDEO BRONCHOSCOPY WITHOUT FLUORO;  Surgeon: Freddi Starr, MD;  Location: [facility];  Service: Pulmonary;  Laterality: N/A;   HPI:  50 y.o. male with a PMHx of  antiphospholipid syndrome, DVT, CKD, HTN, lupus, HLD, obesity, untreated OSA (waiting to be started on home CPAP) and PE (on chronic Lovenox) who presented to the ED with expressive dysphasia. MRI brain revealed an acute left frontal operculum ischemic infarction.   Assessment / Plan / Recommendation Clinical Impression  Patient is presenting with a moderate expressive aphasia (presenting as an anomic aphasia) and a mild receptive language disorder (presented at paragraph reading comprehension level only). He is aware of his errors and although he appears in a good mood, he does report being frustrated. He stated that the reason he was in a good mood was because he knew that his language would improve. He reported that he has some understanding of speech-language therapy as his late daughter who had CP went to [facility] in [place] and he and his wife are still close with the therapists there. Cognition appears WNL and patient did not present with any oral-motor deficits. SLP is recommending Outpatient SLP services to which patient is in agreement.    SLP Assessment  SLP Recommendation/Assessment: All further Speech Lanaguage Pathology  needs can be addressed in the next venue of care SLP Visit Diagnosis: Aphasia (R47.01)    Follow Up Recommendations  Outpatient SLP    Frequency and Duration   N/A        SLP Evaluation Cognition  Overall Cognitive Status: Within Functional Limits for tasks assessed Arousal/Alertness: Awake/alert Orientation Level: Oriented X4       Comprehension  Auditory Comprehension Overall Auditory Comprehension: Impaired Yes/No Questions: Within Functional Limits Commands: Within Functional Limits EffectiveTechniques: Extra processing time Reading Comprehension Reading Status: Impaired Word level: Within functional limits Sentence Level: Within functional limits Paragraph Level: Impaired Functional Environmental (signs, name badge): Within  functional limits    Expression Expression Primary Mode of Expression: Verbal Verbal Expression Overall Verbal Expression: Impaired Initiation: No impairment Level of Generative/Spontaneous Verbalization: Conversation Repetition: No impairment Naming: Impairment Responsive: Not tested Confrontation: Impaired Convergent: Not tested Divergent: 25-49% accurate Verbal Errors: Phonemic paraphasias;Aware of errors Pragmatics: No impairment Effective Techniques: Semantic cues;Sentence completion Non-Verbal Means of Communication: Not applicable Written Expression Dominant Hand: Right Written Expression: Not tested   Oral / Motor  Oral Motor/Sensory Function Overall Oral Motor/Sensory Function: Within functional limits   [place], MA, CCC-SLP Speech Therapy

## 2021-03-22 NOTE — Progress Notes (Incomplete)
*  PRELIMINARY RESULTS* Echocardiogram 2D Echocardiogram has been performed.  Wesley Hancock 03/22/2021, 11:50 AM

## 2021-03-22 NOTE — Progress Notes (Addendum)
[place] for Heparin / Warfarin Indication: stroke  Allergies  Allergen Reactions  . Diltiazem Hcl Diarrhea and Other (See Comments)    Lethargic    Patient Measurements: Height: 6\' 2"  (188 cm) Weight: 108.9 kg (240 lb) IBW/kg (Calculated) : 82.2 Heparin Dosing Weight: 104.6 kg  Vital Signs: Temp: 97.8 F (36.6 C) (04/23 0048) Temp Source: Oral (04/23 0048) BP: 129/81 (04/23 0048) Pulse Rate: 81 (04/23 0048)  Labs: Recent Labs    03/20/21 1210 03/20/21 1407 03/21/21 0550 03/21/21 1504 03/22/21 0025  HGB 15.4  --  14.6  --   --   HCT 46.9  --  45.3  --   --   PLT 286  --  263  --   --   APTT  --  44*  --   --   --   LABPROT  --  12.3  --   --   --   INR  --  0.9  --   --   --   HEPARINUNFRC  --   --  0.12* 0.15* 0.38  CREATININE 1.17  --   --   --   --     Estimated Creatinine Clearance: 101.5 mL/min (by C-G formula based on SCr of 1.17 mg/dL).   Medications:  Scheduled:  Infusions:   Assessment: 49 yoM to ED with mild word-finding difficulty, no other deficit. 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  Continues on Warfarin and Heparin  Heparin level 0.28 this AM, INR 0.9 CBC stable  Goal of Therapy:  Heparin level 0.3-0.5 units/ml Monitor platelets by anticoagulation protocol: Yes  INR goal 2.5 to 3.5   Plan:  Heparin to 1750 units / hr Warfarin 10 mg po x 1 dose  Daily labs  Thank you Anette Guarneri, PharmD

## 2021-03-22 NOTE — Progress Notes (Addendum)
PROGRESS NOTE    Wesley Hancock  HKV:[id number] DOB: January 05, 1971 DOA: 03/20/2021 PCP: Inda Coke, PA    Brief Narrative:  [name] was admitted to the hospital with a working diagnosis of acute ischemic CVA, left frontal cortex and operculum in the setting of antiphospholipid syndrome.   50 year old male past medical history for pulm embolism, antiphospholipid syndrome, chronic kidney disease, hypertension and lupus who presents with difficulty finding words.  Patient developed sudden onset of acute aphasia that prompted him to come to the hospital, no other focal neurologic deficits.  On his initial physical examination blood pressure 147/93, heart rate 94, respiratory 22, oxygen saturation 95% his lungs are clear to auscultation bilaterally, heart S1-S2, present, rhythmic, soft abdomen, no lower extremity edema, his strength was preserved all 4 extremities, positive expressive aphasia.  Sodium 135, potassium 4.2, chloride 99, bicarb 26, glucose 156, BUN 28, creatinine 1.17, white count 10.1, hemoglobin 15.4, hematocrit 46.9, platelets 286. SARS COVID-19 negative. Urinalysis specific gravity 1.040.  Brain MRI with acute infarct involving the left frontal cortex and operculum,.  Mild associated edema without mass-effect.  EKG 83 bpm, normal axis, normal intervals, sinus rhythm, q wave lead I-aVL, no significant ST segment or T wave changes.  Patient placed on heparin drip as therapeutic bridging to warfarin.  Target anticoagulation INR 2,5 to 3,5.    Assessment & Plan:   Principal Problem:   CVA (cerebral vascular accident) ([place]) Active Problems:   Hypertensive heart disease   GAD (generalized anxiety disorder)   CKD (chronic kidney disease) stage 2, GFR 60-89 ml/min   APS (antiphospholipid syndrome) ([place])   Mixed connective tissue disease ([place])   Stroke ([place])   1. Acute ischemic CVA, left frontal cortex and operculum.  Focal neurologic deficit of  expressive aphasia is improving.  INR today is 0.9   Plan to continue anticoagulation with heparin drip, as therapeutic bridge to warfarin, target INR 2,5 to 3,5.  Follow on echocardiogram to complete workup.  Plan for dc home in 48 hrs with enoxaparin bridging, will need coordination for prompt outpatient follow up with warfarin clinic.  Out of bed to chair tid with meals.   2. Lupus with positive antiphospholipid Continue with mycophenilate and prednisone per home regimen.  No clinical signs of vasculitis or lupus flare.   3. Dyslipidemia. On rosuvastatin and ezetimibe.   4. Depression. On escitalopram and bupropion.   5. CKD stage 2. Tolerating po well, follow renal function in am.   6. Reactive leukocytosis. Wbc is 11,7, no signs of systemic infection.    7. HTN. Systolic blood pressure 564 to 150 mmHg, continue close monitoring, hold on antihypertensive medications for now.   Status is: Inpatient  Remains inpatient appropriate because:Inpatient level of care appropriate due to severity of illness   Dispo: The patient is from: Home              Anticipated d/c is to: Home              Patient currently is not medically stable to d/c.   Difficult to place patient No   DVT prophylaxis: Heparin and warfarin   Code Status:   full  Family Communication:  No family at the bedside      Consultants:   Neurology    Subjective: Patient is feeling better, expressive aphasia continue to improve, no nausea or vomiting, no dyspnea or chest pain.   Objective: Vitals:   03/21/21 1953 03/22/21 0048 03/22/21 0348 03/22/21 0747  BP: Marland Kitchen)  150/106 129/81 (!) 141/80 135/88  Pulse: 98 81 79 81  Resp: 20 20 20 18   Temp: 98.5 F (36.9 C) 97.8 F (36.6 C) 97.9 F (36.6 C) 97.7 F (36.5 C)  TempSrc: Oral Oral Oral Oral  SpO2: 94% 93% 97% 97%  Weight:      Height:       No intake or output data in the 24 hours ending 03/22/21 1041 Filed Weights   03/20/21 1153  Weight:  108.9 kg    Examination:   General: Not in pain or dyspnea,.  Neurology: Awake and alert, non focal  E ENT: no pallor, no icterus, oral mucosa moist Cardiovascular: No JVD. S1-S2 present, rhythmic, no gallops, rubs, or murmurs. No lower extremity edema. Pulmonary: positive breath sounds bilaterally, adequate air movement, no wheezing, rhonchi or rales. Gastrointestinal. Abdomen soft and non tender Skin. No rashes Musculoskeletal: no joint deformities     Data Reviewed: I have personally reviewed following labs and imaging studies  CBC: Recent Labs  Lab 03/20/21 1210 03/21/21 0550 03/22/21 0310  WBC 10.1 10.6* 11.7*  NEUTROABS 9.2*  --   --   HGB 15.4 14.6 15.0  HCT 46.9 45.3 46.3  MCV 92.5 93.8 93.0  PLT 286 263 009   Basic Metabolic Panel: Recent Labs  Lab 03/20/21 1210  NA 135  K 4.2  CL 99  CO2 26  GLUCOSE 156*  BUN 28*  CREATININE 1.17  CALCIUM 9.7   GFR: Estimated Creatinine Clearance: 101.5 mL/min (by C-G formula based on SCr of 1.17 mg/dL). Liver Function Tests: Recent Labs  Lab 03/20/21 1210  AST 27  ALT 42  ALKPHOS 48  BILITOT 0.9  PROT 8.4*  ALBUMIN 4.5   No results for input(s): LIPASE, AMYLASE in the last 168 hours. No results for input(s): AMMONIA in the last 168 hours. Coagulation Profile: Recent Labs  Lab 03/20/21 1407 03/22/21 0310  INR 0.9 0.9   Cardiac Enzymes: No results for input(s): CKTOTAL, CKMB, CKMBINDEX, TROPONINI in the last 168 hours. BNP (last 3 results) Recent Labs    07/12/20 0913  PROBNP <5   HbA1C: Recent Labs    03/21/21 0550  HGBA1C 6.0*   CBG: No results for input(s): GLUCAP in the last 168 hours. Lipid Profile: Recent Labs    03/21/21 0550  CHOL 325*  HDL 51  LDLCALC 234*  TRIG 202*  CHOLHDL 6.4   Thyroid Function Tests: No results for input(s): TSH, T4TOTAL, FREET4, T3FREE, THYROIDAB in the last 72 hours. Anemia Panel: No results for input(s): VITAMINB12, FOLATE, FERRITIN, TIBC, IRON,  RETICCTPCT in the last 72 hours.    Radiology Studies: I have reviewed all of the imaging during this hospital visit personally     Scheduled Meds: . buPROPion  150 mg Oral Daily  . escitalopram  20 mg Oral Daily  . ezetimibe  10 mg Oral Daily  . mycophenolate  500 mg Oral BID  . pantoprazole  40 mg Oral BID  . predniSONE  40 mg Oral Daily  . rosuvastatin  40 mg Oral Daily  . warfarin  10 mg Oral ONCE-1600  . Warfarin - Pharmacist Dosing Inpatient   Does not apply q1600   Continuous Infusions: . heparin 1,700 Units/hr (03/22/21 1033)     LOS: 2 days        Dashonda Bonneau Gerome Apley, MD

## 2021-03-23 LAB — BASIC METABOLIC PANEL
Anion gap: 10 (ref 5–15)
BUN: 16 mg/dL (ref 6–20)
CO2: 24 mmol/L (ref 22–32)
Calcium: 9.2 mg/dL (ref 8.9–10.3)
Chloride: 105 mmol/L (ref 98–111)
Creatinine, Ser: 1.24 mg/dL (ref 0.61–1.24)
GFR, Estimated: >60 mL/min (ref >60)
Glucose, Bld: 107 mg/dL — ABNORMAL HIGH (ref 70–99)
Potassium: 4.7 mmol/L (ref 3.5–5.1)
Sodium: 139 mmol/L (ref 135–145)

## 2021-03-23 LAB — CBC
HCT: 45.3 % (ref 39.0–52.0)
Hemoglobin: 14.9 g/dL (ref 13.0–17.0)
MCH: 30.6 pg (ref 26.0–34.0)
MCHC: 32.9 g/dL (ref 30.0–36.0)
MCV: 93 fL (ref 80.0–100.0)
Platelets: 250 10*3/uL (ref 150–400)
RBC: 4.87 MIL/uL (ref 4.22–5.81)
RDW: 13.5 % (ref 11.5–15.5)
WBC: 13 10*3/uL — ABNORMAL HIGH (ref 4.0–10.5)
nRBC: 0 % (ref 0.0–0.2)

## 2021-03-23 LAB — HEPARIN LEVEL (UNFRACTIONATED): Heparin Unfractionated: 0.37 IU/mL (ref 0.30–0.70)

## 2021-03-23 LAB — PROTIME-INR
INR: 0.9 (ref 0.8–1.2)
Prothrombin Time: 12.5 seconds (ref 11.4–15.2)

## 2021-03-23 MED ORDER — WARFARIN SODIUM 7.5 MG PO TABS
12.5000 mg | ORAL_TABLET | Freq: Once | ORAL | Status: AC
  Administered 2021-03-23: 12.5 mg via ORAL
  Filled 2021-03-23: qty 1

## 2021-03-23 NOTE — Progress Notes (Signed)
[place] for Heparin / Warfarin Indication: stroke  Allergies  Allergen Reactions  . Diltiazem Hcl Diarrhea and Other (See Comments)    Lethargic    Patient Measurements: Height: 6\' 2"  (188 cm) Weight: 108.9 kg (240 lb) IBW/kg (Calculated) : 82.2 Heparin Dosing Weight: 104.6 kg  Vital Signs: Temp: 97.8 F (36.6 C) (04/24 0723) Temp Source: Oral (04/24 0723) BP: 147/96 (04/24 0723) Pulse Rate: 84 (04/24 0723)  Labs: Recent Labs    03/20/21 1210 03/20/21 1407 03/21/21 0550 03/21/21 1504 03/22/21 0025 03/22/21 0310 03/23/21 0258  HGB 15.4  --  14.6  --   --  15.0 14.9  HCT 46.9  --  45.3  --   --  46.3 45.3  PLT 286  --  263  --   --  280 250  APTT  --  44*  --   --   --   --   --   LABPROT  --  12.3  --   --   --  12.5 12.5  INR  --  0.9  --   --   --  0.9 0.9  HEPARINUNFRC  --   --  0.12*   < > 0.38 0.28* 0.37  CREATININE 1.17  --   --   --   --   --  1.24   < > = values in this interval not displayed.    Estimated Creatinine Clearance: 95.7 mL/min (by C-G formula based on SCr of 1.24 mg/dL).   Medications:  Scheduled:  Infusions:   Assessment: 49 yoM to ED with mild word-finding difficulty, no other deficit. 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  Continues on Warfarin and Heparin  Heparin level therapeutic this AM, INR 0.9 (not moving) CBC stable  Goal of Therapy:  Heparin level 0.3-0.5 units/ml Monitor platelets by anticoagulation protocol: Yes  INR goal 2.5 to 3.5   Plan:  Heparin to 1750 units / hr Warfarin to 12.5 mg po x 1 dose  Daily labs  Thank you Anette Guarneri, PharmD

## 2021-03-23 NOTE — Progress Notes (Signed)
PROGRESS NOTE    Wesley Hancock  GUR:[id number] DOB: 1971/04/28 DOA: 03/20/2021 PCP: Inda Coke, PA    Brief Narrative:  Wesley Hancock was admitted to the hospital with a working diagnosis of acute ischemic CVA, left frontal cortex andoperculum in the setting of antiphospholipid syndrome.   50 year old male past medical history for pulm embolism, antiphospholipid syndrome, chronic kidney disease, hypertension and lupus who presents with difficulty finding words. Patient developed sudden onset of acute aphasia that prompted him to come to the hospital, no other focal neurologic deficits. On his initial physical examination blood pressure 147/93, heart rate 94, respiratory 22, oxygen saturation 95% his lungs are clear to auscultation bilaterally, heart S1-S2, present, rhythmic, soft abdomen, no lower extremity edema, his strength was preserved all 4 extremities, positive expressive aphasia.  Sodium 135, potassium 4.2, chloride 99, bicarb 26, glucose 156, BUN 28, creatinine 1.17, white count 10.1, hemoglobin 15.4, hematocrit 46.9, platelets 286. SARS COVID-19 negative. Urinalysis specific gravity 1.040.  Brain MRI with acute infarct involving the left frontal cortex andoperculum,. Mild associated edema without mass-effect.  EKG 83 bpm, normal axis, normal intervals, sinus rhythm,qwave lead I-aVL, no significant ST segment or T wave changes.  Patient placed on heparin drip as therapeutic bridging to warfarin. Target anticoagulation INR 2,5 to 3,5.   Plan for possible dc home in am with enoxaparin bridge and oral warfarin.   Assessment & Plan:   Principal Problem:   CVA (cerebral vascular accident) ([place]) Active Problems:   Hypertensive heart disease   GAD (generalized anxiety disorder)   CKD (chronic kidney disease) stage 2, GFR 60-89 ml/min   APS (antiphospholipid syndrome) ([place])   Mixed connective tissue disease ([place])   Stroke ([place])    1. Acute  ischemic CVA, left frontal cortex and operculum. Focal neurologic deficit of expressive aphasia continue improving.  INR today continue at 0.9  Echocardiogram with preserved LV systolic function 55 to 28%, with mild left ventricle hypertrophy. Negative bubble study. No embolic phenomena.   Anticoagulation with heparin drip, as therapeutic bridge to warfarin, target INR 2,5 to 3,5.  Continue with speech therapy. Plan for possible dc home in am, with enoxaparin bridge.   2. Lupus with positive antiphospholipid On mycophenilate and prednisone per home regimen.  No acute clinical signs of vasculitis or lupus flare.   3. Dyslipidemia. Continue with rosuvastatin and ezetimibe.   4. Depression. continue with escitalopram and bupropion.   5. CKD stage 2. stable renal function with serum cr at  1.24 with K at 4,7 and serum bicarbonate at 24.   6. Reactive leukocytosis. Wbc is 13,0 with no signs of systemic infection.  Continue to hold on antibiotic therapy.   7. HTN. Blood pressure has been stable     Status is: Inpatient  Remains inpatient appropriate because:IV treatments appropriate due to intensity of illness or inability to take PO   Dispo: The patient is from: Home              Anticipated d/c is to: Home              Patient currently is not medically stable to d/c.   Difficult to place patient No  DVT prophylaxis: Heparin and warfarin   Code Status:   full  Family Communication:  I spoke with patient's wife and mother at the bedside, we talked in detail about patient's condition, plan of care and prognosis and all questions were addressed.      Consultants:   Neurology  Subjective: Patient is feeling better, aphasia continue to improve, no nausea or vomiting, no bleeding.   Objective: Vitals:   03/23/21 0001 03/23/21 0501 03/23/21 0723 03/23/21 1205  BP: 127/80 (!) 147/98 (!) 147/96 (!) 152/99  Pulse: 73 75 84 82  Resp: 16 20 18 20   Temp: 98.3 F (36.8  C) 97.8 F (36.6 C) 97.8 F (36.6 C) (!) 97.4 F (36.3 C)  TempSrc: Oral Oral Oral Oral  SpO2: 94% 98% 100% 94%  Weight:      Height:        Intake/Output Summary (Last 24 hours) at 03/23/2021 1228 Last data filed at 03/22/2021 1505 Gross per 24 hour  Intake 621.49 ml  Output --  Net 621.49 ml   Filed Weights   03/20/21 1153  Weight: 108.9 kg    Examination:   General: Not in pain or dyspnea, Neurology: Awake and alert, non focal  E ENT: no pallor, no icterus, oral mucosa moist Cardiovascular: No JVD. S1-S2 present, rhythmic, no gallops, rubs, or murmurs. No lower extremity edema. Pulmonary: positive breath sounds bilaterally, Gastrointestinal. Abdomen soft and non tender Skin. No rashes Musculoskeletal: no joint deformities     Data Reviewed: I have personally reviewed following labs and imaging studies  CBC: Recent Labs  Lab 03/20/21 1210 03/21/21 0550 03/22/21 0310 03/23/21 0258  WBC 10.1 10.6* 11.7* 13.0*  NEUTROABS 9.2*  --   --   --   HGB 15.4 14.6 15.0 14.9  HCT 46.9 45.3 46.3 45.3  MCV 92.5 93.8 93.0 93.0  PLT 286 263 280 423   Basic Metabolic Panel: Recent Labs  Lab 03/20/21 1210 03/23/21 0258  NA 135 139  K 4.2 4.7  CL 99 105  CO2 26 24  GLUCOSE 156* 107*  BUN 28* 16  CREATININE 1.17 1.24  CALCIUM 9.7 9.2   GFR: Estimated Creatinine Clearance: 95.7 mL/min (by C-G formula based on SCr of 1.24 mg/dL). Liver Function Tests: Recent Labs  Lab 03/20/21 1210  AST 27  ALT 42  ALKPHOS 48  BILITOT 0.9  PROT 8.4*  ALBUMIN 4.5   No results for input(s): LIPASE, AMYLASE in the last 168 hours. No results for input(s): AMMONIA in the last 168 hours. Coagulation Profile: Recent Labs  Lab 03/20/21 1407 03/22/21 0310 03/23/21 0258  INR 0.9 0.9 0.9   Cardiac Enzymes: No results for input(s): CKTOTAL, CKMB, CKMBINDEX, TROPONINI in the last 168 hours. BNP (last 3 results) Recent Labs    07/12/20 0913  PROBNP <5   HbA1C: Recent Labs     03/21/21 0550  HGBA1C 6.0*   CBG: No results for input(s): GLUCAP in the last 168 hours. Lipid Profile: Recent Labs    03/21/21 0550  CHOL 325*  HDL 51  LDLCALC 234*  TRIG 202*  CHOLHDL 6.4   Thyroid Function Tests: No results for input(s): TSH, T4TOTAL, FREET4, T3FREE, THYROIDAB in the last 72 hours. Anemia Panel: No results for input(s): VITAMINB12, FOLATE, FERRITIN, TIBC, IRON, RETICCTPCT in the last 72 hours.    Radiology Studies: I have reviewed all of the imaging during this hospital visit personally     Scheduled Meds: . buPROPion  150 mg Oral Daily  . escitalopram  20 mg Oral Daily  . ezetimibe  10 mg Oral Daily  . mycophenolate  500 mg Oral BID  . pantoprazole  40 mg Oral BID  . predniSONE  40 mg Oral Daily  . rosuvastatin  40 mg Oral Daily  . warfarin  12.5  mg Oral ONCE-1600  . Warfarin - Pharmacist Dosing Inpatient   Does not apply q1600   Continuous Infusions: . heparin 1,700 Units/hr (03/22/21 1033)     LOS: 3 days        Delle Andrzejewski Gerome Apley, MD

## 2021-03-23 NOTE — Discharge Instructions (Signed)

## 2021-03-23 NOTE — Plan of Care (Signed)
  Problem: Education: Goal: Knowledge of General Education information will improve Description Including pain rating scale, medication(s)/side effects and non-pharmacologic comfort measures Outcome: Progressing   Problem: Health Behavior/Discharge Planning: Goal: Ability to manage health-related needs will improve Outcome: Progressing   Problem: Clinical Measurements: Goal: Ability to maintain clinical measurements within normal limits will improve Outcome: Progressing Goal: Will remain free from infection Outcome: Progressing Goal: Diagnostic test results will improve Outcome: Progressing Goal: Respiratory complications will improve Outcome: Progressing Goal: Cardiovascular complication will be avoided Outcome: Progressing   Problem: Activity: Goal: Risk for activity intolerance will decrease Outcome: Progressing   Problem: Nutrition: Goal: Adequate nutrition will be maintained Outcome: Progressing   Problem: Coping: Goal: Level of anxiety will decrease Outcome: Progressing   Problem: Elimination: Goal: Will not experience complications related to bowel motility Outcome: Progressing Goal: Will not experience complications related to urinary retention Outcome: Progressing   Problem: Pain Managment: Goal: General experience of comfort will improve Outcome: Progressing   Problem: Safety: Goal: Ability to remain free from injury will improve Outcome: Progressing   Problem: Skin Integrity: Goal: Risk for impaired skin integrity will decrease Outcome: Progressing   Problem: Education: Goal: Knowledge of secondary prevention will improve Outcome: Progressing Goal: Knowledge of patient specific risk factors addressed and post discharge goals established will improve Outcome: Progressing   

## 2021-03-24 ENCOUNTER — Telehealth: Payer: Self-pay | Admitting: Pulmonary Disease

## 2021-03-24 ENCOUNTER — Other Ambulatory Visit (HOSPITAL_COMMUNITY): Payer: Self-pay

## 2021-03-24 LAB — CBC
HCT: 47.5 % (ref 39.0–52.0)
Hemoglobin: 15.5 g/dL (ref 13.0–17.0)
MCH: 30.2 pg (ref 26.0–34.0)
MCHC: 32.6 g/dL (ref 30.0–36.0)
MCV: 92.6 fL (ref 80.0–100.0)
Platelets: 267 10*3/uL (ref 150–400)
RBC: 5.13 MIL/uL (ref 4.22–5.81)
RDW: 13.3 % (ref 11.5–15.5)
WBC: 15.8 10*3/uL — ABNORMAL HIGH (ref 4.0–10.5)
nRBC: 0 % (ref 0.0–0.2)

## 2021-03-24 LAB — PROTIME-INR
INR: 1.2 (ref 0.8–1.2)
Prothrombin Time: 14.8 seconds (ref 11.4–15.2)

## 2021-03-24 LAB — HEPARIN LEVEL (UNFRACTIONATED): Heparin Unfractionated: 0.43 IU/mL (ref 0.30–0.70)

## 2021-03-24 MED ORDER — ROSUVASTATIN CALCIUM 40 MG PO TABS
40.0000 mg | ORAL_TABLET | Freq: Every day | ORAL | 0 refills | Status: DC
  Filled 2021-03-24: qty 30, 30d supply, fill #0

## 2021-03-24 MED ORDER — HYDROCHLOROTHIAZIDE 25 MG PO TABS
25.0000 mg | ORAL_TABLET | Freq: Every day | ORAL | Status: DC
  Administered 2021-03-24: 25 mg via ORAL
  Filled 2021-03-24: qty 1

## 2021-03-24 MED ORDER — TELMISARTAN-HCTZ 80-25 MG PO TABS: 1.0000 | ORAL_TABLET | Freq: Every day | ORAL | Status: DC

## 2021-03-24 MED ORDER — IRBESARTAN 300 MG PO TABS
300.0000 mg | ORAL_TABLET | Freq: Every day | ORAL | Status: DC
  Administered 2021-03-24: 300 mg via ORAL
  Filled 2021-03-24: qty 1

## 2021-03-24 MED ORDER — WARFARIN SODIUM 5 MG PO TABS
10.0000 mg | ORAL_TABLET | Freq: Every day | ORAL | 0 refills | Status: DC
  Filled 2021-03-24: qty 30, 15d supply, fill #0

## 2021-03-24 MED ORDER — ENOXAPARIN SODIUM 120 MG/0.8ML ~~LOC~~ SOLN
110.0000 mg | Freq: Two times a day (BID) | SUBCUTANEOUS | 0 refills | Status: DC
  Filled 2021-03-24: qty 12, 7d supply, fill #0

## 2021-03-24 MED ORDER — WARFARIN SODIUM 7.5 MG PO TABS: 12.5000 mg | ORAL_TABLET | Freq: Once | ORAL | Status: DC

## 2021-03-24 MED ORDER — WARFARIN SODIUM 7.5 MG PO TABS
12.5000 mg | ORAL_TABLET | Freq: Once | ORAL | Status: AC
  Administered 2021-03-24: 12.5 mg via ORAL
  Filled 2021-03-24: qty 1

## 2021-03-24 MED ORDER — ENOXAPARIN SODIUM 120 MG/0.8ML ~~LOC~~ SOLN
110.0000 mg | Freq: Two times a day (BID) | SUBCUTANEOUS | Status: DC
  Administered 2021-03-24: 110 mg via SUBCUTANEOUS
  Filled 2021-03-24 (×2): qty 0.74

## 2021-03-24 NOTE — Discharge Summary (Signed)
Physician Discharge Summary  Wesley Hancock [id number] DOB: 07/11/1971 DOA: 03/20/2021  PCP: Wesley Coke, PA  Admit date: 03/20/2021 Discharge date: 03/24/2021  Admitted From: Home  Disposition:  Home   Recommendations for Outpatient Follow-up and new medication changes:  1. Follow up with Wesley Coke PA in 7 to 10 days.  2. Continue warfarin for anticoagulation with enoxaparin bridge, target INR 2,5 to 3,5.  3. Discharge INR is 1,2. Current dose of warfarin 10 mg daily.  4. Atorvastatin increased to 40 mg daily.   Home Health: no   Equipment/Devices: no    Discharge Condition: stable  CODE STATUS: full  Diet recommendation:  Heart healthy   Brief/Interim Summary: [name] was admitted to the hospital with a working diagnosis of acute ischemic CVA, left frontal cortex andoperculumin the setting of antiphospholipid syndrome.  50 year old male past medical history for pulmonary embolism, antiphospholipid syndrome, chronic kidney disease, hypertension and lupus who presents with difficulty finding words.  Positive COVID-19 12/2020. Patient developed sudden onset of acute aphasia that prompted him to come to the hospital, no other focal neurologic deficits. On his initial physical examination blood pressure 147/93, heart rate 94, respiratory rate 22, oxygen saturation 95% his lungs were clear to auscultation bilaterally, heart S1-S2, present, rhythmic, soft abdomen, no lower extremity edema, his strength was preserved all 4 extremities, positive expressive aphasia.  Sodium 135, potassium 4.2, chloride 99, bicarb 26, glucose 156, BUN 28, creatinine 1.17, white count 10.1, hemoglobin 15.4, hematocrit 46.9, platelets 286. SARS COVID-19 negative. Urinalysis specific gravity 1.040.  Brain MRI with acute infarct involving the left frontal cortex andoperculum,. Mild associated edema without mass-effect.  EKG 83 bpm, normal axis, normal intervals, sinus  rhythm,qwave lead I-aVL, no significant ST segment or T wave changes.  Patient placed on heparin drip as therapeutic bridging to warfarin. Target anticoagulation INR 2,5 to 3,5.  Further work-up with echocardiography showed preserved LV and RV function.  Negative bubble study, no embolic phenomena.  Neurologic deficit improving, patient tolerating well anticoagulation, will continue outpatient management with warfarin and bridge anticoagulation with enoxaparin.   1.  Acute ischemic CVA, left frontal cortex and operculum.  Patient was admitted to the medical ward, he was placed on a remote telemetry monitor, He was placed on intravenous heparin for anticoagulation along with oral warfarin. He received physical therapy, occupational therapy and speech therapy.  Neurology recommended to target INR between 2.5 and 3.5. Patient will be discharged home with instructions to continue anticoagulation with warfarin, bridged with enoxaparin. Discharge INR 1.2 Patient received warfarin teaching.   Will need close follow-up as an outpatient.  2.  Lupus with positive antiphospholipid.  Patient will continue with mycophenolate and prednisone per his home regimen.  No clinical signs of vasculitis or lupus flare.  3.  Dyslipidemia.  Continue rosuvastatin and ezetimibe.  4.  Depression.  Continue escitalopram and bupropion.  5.  Chronic kidney disease stage II.  His kidney function remained stable with a serum creatinine of 1.24, sodium 139, potassium 4.7, chloride 105, bicarb 24, glucose 107, BUN 16.  6.  Reactive leukocytosis.  His discharge white cell count is 15.8, patient has remained afebrile, no signs of infection.  No antibiotic therapy was prescribed. Follow-up cell count as an outpatient.  7. HTN.  His blood pressure systolic has remained between 130-140 mmHg.  At discharge patient will resume telmisartan/hydrochlorothiazide.  Discharge Diagnoses:  Principal Problem:   CVA (cerebral  vascular accident) [facility]) Active Problems:   Hypertensive heart disease  GAD (generalized anxiety disorder)   CKD (chronic kidney disease) stage 2, GFR 60-89 ml/min   APS (antiphospholipid syndrome) ([name])   Mixed connective tissue disease ([place])   Stroke [facility])    Discharge Instructions  Discharge Instructions    Ambulatory referral to Neurology   Complete by: As directed    Follow up with Dr. Leonie Hancock at [facility] in 4-6 weeks. Too complicated for RN to follow. Thanks.   Ambulatory referral to Speech Therapy   Complete by: As directed      Allergies as of 03/24/2021      Reactions   Diltiazem Hcl Diarrhea, Other (See Comments)   Lethargic       Medication List    TAKE these medications   acetaminophen 500 MG tablet Commonly known as: TYLENOL Take 1,000 mg by mouth every 6 (six) hours as needed for mild pain.   buPROPion 150 MG 24 hr tablet Commonly known as: WELLBUTRIN XL Take 1 tablet daily What changed:   how much to take  how to take this  when to take this  additional instructions   enoxaparin 120 MG/0.8ML injection Commonly known as: LOVENOX Inject 0.74 mLs (110 mg total) into the skin every 12 (twelve) hours. What changed:   how much to take  when to take this   escitalopram 20 MG tablet Commonly known as: LEXAPRO TAKE 1 TABLET BY MOUTH EVERY DAY   ezetimibe 10 MG tablet Commonly known as: ZETIA Take 1 tablet (10 mg total) by mouth daily.   fluticasone 50 MCG/ACT nasal spray Commonly known as: FLONASE Place 1 spray into both nostrils daily as needed for allergies.   ibuprofen 200 MG tablet Commonly known as: ADVIL Take 400 mg by mouth every 6 (six) hours as needed for fever, headache or mild pain.   mycophenolate 500 MG tablet Commonly known as: CELLCEPT Take 1 tablet (500 mg total) by mouth 2 (two) times daily.   pantoprazole 40 MG tablet Commonly known as: Protonix Take 1 tablet (40 mg total) by mouth 2 (two) times daily.   predniSONE 20 MG  tablet Commonly known as: DELTASONE TAKE 2 TABLETS (40 MG TOTAL) BY MOUTH DAILY.   PROBIOTIC PO Take 1 capsule by mouth daily.   rosuvastatin 40 MG tablet Commonly known as: CRESTOR Take 1 tablet (40 mg total) by mouth daily. What changed:   medication strength  how much to take   telmisartan-hydrochlorothiazide 80-25 MG tablet Commonly known as: MICARDIS HCT TAKE 1 TABLET BY MOUTH EVERY DAY   warfarin 5 MG tablet Commonly known as: COUMADIN Take 2 tablets (10 mg total) by mouth daily. Start on 03/25/21       Follow-up Information    [facility].   Specialty: Rehabilitation Why: The outpatient therapy will contact you for the first appointment Contact information: [address] [place]       Garvin Fila, MD. Schedule an appointment as soon as possible for a visit in 4 week(s).   Specialties: Neurology, Radiology Contact information: [address] [place] [address] [phone]              Allergies  Allergen Reactions  . Diltiazem Hcl Diarrhea and Other (See Comments)    Lethargic     Consultations:  Neurology    Procedures/Studies: CT ANGIO HEAD W OR WO CONTRAST  Result Date: 03/20/2021 CLINICAL DATA:  Acute neuro deficit.  Speech difficulty EXAM: CT  ANGIOGRAPHY HEAD AND NECK TECHNIQUE: Multidetector CT imaging of the head and neck was performed using the standard protocol during bolus administration of intravenous contrast. Multiplanar CT image reconstructions and MIPs were obtained to evaluate the vascular anatomy. Carotid stenosis measurements (when applicable) are obtained utilizing NASCET criteria, using the distal internal carotid diameter as the denominator. CONTRAST:  54mL OMNIPAQUE IOHEXOL 350 MG/ML SOLN COMPARISON:  CT head 02/25/2021.  MRI head 03/20/2021 FINDINGS: CT HEAD FINDINGS Brain: Ill-defined  hypodensity in the left mid frontal lobe involving cortex and white matter. This is consistent with acute infarct which shows restricted diffusion. No associated hemorrhage No other acute infarct, hemorrhage, mass.  Ventricle size normal Vascular: Negative for hyperdense vessel Skull: Negative Sinuses: Negative Orbits: Negative Review of the MIP images confirms the above findings CTA NECK FINDINGS Aortic arch: Bovine branching arch. Mild atherosclerotic disease proximal great vessels without stenosis Right carotid system: Right vertebral artery widely patent without stenosis. Minimal atherosclerotic disease right carotid bifurcation Left carotid system: No significant left carotid stenosis. No significant atherosclerotic disease Vertebral arteries: Both vertebral arteries widely patent to the basilar without stenosis. Skeleton: No acute skeletal abnormality. Other neck: 24 mm right thyroid nodule. No enlarged lymph nodes in the neck. Upper chest: Lung apices clear bilaterally. Review of the MIP images confirms the above findings CTA HEAD FINDINGS Anterior circulation: Cavernous carotid widely patent bilaterally. Anterior and middle cerebral arteries patent bilaterally. Left M1 segment widely patent. Decreased caliber and irregularity of 2 branches in the superior division of the left MCA likely due to thrombus with recent infarct in the left frontal lobe. This is best seen on series 17, image 25. There is distal occlusion of these vessels. Both anterior cerebral arteries widely patent. Right middle cerebral artery widely patent. Posterior circulation: Both vertebral arteries widely patent to the basilar. PICA patent bilaterally. Basilar widely patent. Superior cerebellar and posterior cerebral arteries patent without stenosis Venous sinuses: Normal venous enhancement. Anatomic variants: None Review of the MIP images confirms the above findings IMPRESSION: 1. Hypodensity left frontal lobe compatible with acute  infarct as identified on MRI today. There is small caliber and irregularity of 2 branches supplying this area involving the superior division left MCA compatible with thrombus, with distal occlusion. 2. Otherwise no intracranial stenosis 3. No extracranial stenosis in the carotid or vertebral arteries. 4. Code stroke imaging results were communicated on 03/20/2021 at 3:39 pm to provider Rory Percy via text page 5. 24 mm right thyroid nodule. Refer to recommendations on thyroid ultrasound 01/07/2021 Electronically Signed   By: Franchot Gallo M.D.   On: 03/20/2021 15:43   CT HEAD W & WO CONTRAST  Result Date: 02/25/2021 CLINICAL DATA:  Headache for 4 days.  On blood thinners. EXAM: CT HEAD WITHOUT AND WITH CONTRAST TECHNIQUE: Contiguous axial images were obtained from the base of the skull through the vertex without and with intravenous contrast CONTRAST:  56mL OMNIPAQUE IOHEXOL 300 MG/ML  SOLN COMPARISON:  Head MRI 09/06/2019 FINDINGS: Brain: There is no evidence of an acute infarct, intracranial hemorrhage, mass, midline shift, or extra-axial fluid collection. The ventricles and sulci are normal. No abnormal enhancement is identified. Vascular: The major dural venous sinuses and large intracranial arteries are enhancing. Skull: No fracture or suspicious osseous lesion. Sinuses/Orbits: Visualized paranasal sinuses and mastoid air cells are clear. Unremarkable orbits. Other: None. IMPRESSION: Negative head CT. Electronically Signed   By: Logan Bores M.D.   On: 02/25/2021 14:34   CT ANGIO NECK W OR WO CONTRAST  Result Date: 03/20/2021 CLINICAL DATA:  Acute neuro deficit.  Speech difficulty EXAM: CT ANGIOGRAPHY HEAD AND NECK TECHNIQUE: Multidetector CT imaging of the head and neck was performed using the standard protocol during bolus administration of intravenous contrast. Multiplanar CT image reconstructions and MIPs were obtained to evaluate the vascular anatomy. Carotid stenosis measurements (when applicable) are  obtained utilizing NASCET criteria, using the distal internal carotid diameter as the denominator. CONTRAST:  70mL OMNIPAQUE IOHEXOL 350 MG/ML SOLN COMPARISON:  CT head 02/25/2021.  MRI head 03/20/2021 FINDINGS: CT HEAD FINDINGS Brain: Ill-defined hypodensity in the left mid frontal lobe involving cortex and white matter. This is consistent with acute infarct which shows restricted diffusion. No associated hemorrhage No other acute infarct, hemorrhage, mass.  Ventricle size normal Vascular: Negative for hyperdense vessel Skull: Negative Sinuses: Negative Orbits: Negative Review of the MIP images confirms the above findings CTA NECK FINDINGS Aortic arch: Bovine branching arch. Mild atherosclerotic disease proximal great vessels without stenosis Right carotid system: Right vertebral artery widely patent without stenosis. Minimal atherosclerotic disease right carotid bifurcation Left carotid system: No significant left carotid stenosis. No significant atherosclerotic disease Vertebral arteries: Both vertebral arteries widely patent to the basilar without stenosis. Skeleton: No acute skeletal abnormality. Other neck: 24 mm right thyroid nodule. No enlarged lymph nodes in the neck. Upper chest: Lung apices clear bilaterally. Review of the MIP images confirms the above findings CTA HEAD FINDINGS Anterior circulation: Cavernous carotid widely patent bilaterally. Anterior and middle cerebral arteries patent bilaterally. Left M1 segment widely patent. Decreased caliber and irregularity of 2 branches in the superior division of the left MCA likely due to thrombus with recent infarct in the left frontal lobe. This is best seen on series 17, image 25. There is distal occlusion of these vessels. Both anterior cerebral arteries widely patent. Right middle cerebral artery widely patent. Posterior circulation: Both vertebral arteries widely patent to the basilar. PICA patent bilaterally. Basilar widely patent. Superior cerebellar  and posterior cerebral arteries patent without stenosis Venous sinuses: Normal venous enhancement. Anatomic variants: None Review of the MIP images confirms the above findings IMPRESSION: 1. Hypodensity left frontal lobe compatible with acute infarct as identified on MRI today. There is small caliber and irregularity of 2 branches supplying this area involving the superior division left MCA compatible with thrombus, with distal occlusion. 2. Otherwise no intracranial stenosis 3. No extracranial stenosis in the carotid or vertebral arteries. 4. Code stroke imaging results were communicated on 03/20/2021 at 3:39 pm to provider Rory Percy via text page 5. 24 mm right thyroid nodule. Refer to recommendations on thyroid ultrasound 01/07/2021 Electronically Signed   By: Franchot Gallo M.D.   On: 03/20/2021 15:43   MR Brain Wo Contrast (neuro protocol)  Result Date: 03/20/2021 CLINICAL DATA:  Neuro deficit, acute stroke suspected. EXAM: MRI HEAD WITHOUT CONTRAST TECHNIQUE: Multiplanar, multiecho pulse sequences of the brain and surrounding structures were obtained without intravenous contrast. COMPARISON:  CT head February 25, 2021.  MRI head September 06, 2019 FINDINGS: Brain: Acute infarct involving the left frontal cortex and operculum. Mild associated edema without mass effect. No acute hemorrhage. No hydrocephalus. Dilated perivascular spaces in the inferior basal ganglia bilaterally. No mass lesion. No midline shift. Basal cisterns are patent. No extra-axial fluid collections. Vascular: Major arterial flow voids are maintained at the skull base. Skull and upper cervical spine: Normal marrow signal. Sinuses/Orbits: Clear sinuses.  Unremarkable orbits. Other: No mastoid effusions. IMPRESSION: Acute infarct involving the left frontal cortex and operculum. Mild associated edema  without mass effect. Electronically Signed   By: Margaretha Sheffield MD   On: 03/20/2021 13:43   CT CEREBRAL PERFUSION W CONTRAST  Result Date:  03/20/2021 EXAM: CT PERFUSION BRAIN TECHNIQUE: Multiphase CT imaging of the brain was performed following IV bolus contrast injection. Subsequent parametric perfusion maps were calculated using RAPID software. CONTRAST:  22mL OMNIPAQUE IOHEXOL 350 MG/ML SOLN COMPARISON:  Same day CTA and MRI FINDINGS: CT Brain Perfusion Findings: CBF (<30%) Volume: 49mL Perfusion (Tmax>6.0s) volume: 15ML Mismatch Volume: 8mL Infarct Core: 0 mL Infarction Location:None identified. IMPRESSION: RAPID calculates an approximately 15 mL area of mismatch in the left frontal lobe; however, given that this area is relatively similar in size/extent to the hypodensity seen on same day CTA the penumbra is favored to be less than 15 mL and probably not significant. Findings discussed with Dr. Rory Percy at 4:47 p.m. Electronically Signed   By: Margaretha Sheffield MD   On: 03/20/2021 16:54   ECHOCARDIOGRAM COMPLETE BUBBLE STUDY  Result Date: 03/22/2021    ECHOCARDIOGRAM REPORT   Patient Name:   Wesley Hancock Date of Exam: 03/22/2021 Medical Rec #:  [id number]           Height:       74.0 in Accession #:    [id number]          Weight:       240.0 lb Date of Birth:  October 11, 1971            BSA:          2.349 m Patient Age:    56 years            BP:           135/88 mmHg Patient Gender: M                   HR:           81 bpm. Exam Location:  Inpatient Procedure: 2D Echo, Cardiac Doppler, Color Doppler and Saline Contrast Bubble            Study Indications:    TIA  History:        Patient has prior history of Echocardiogram examinations. Risk                 Factors:Hypertension.  Sonographer:    Cammy Brochure Referring Phys: [id number] ASHISH ARORA IMPRESSIONS  1. Left ventricular ejection fraction, by estimation, is 55 to 60%. The left ventricle has normal function. The left ventricle has no regional wall motion abnormalities. There is mild left ventricular hypertrophy. Left ventricular diastolic parameters are indeterminate.  2. Right  ventricular systolic function is normal. The right ventricular size is normal. Tricuspid regurgitation signal is inadequate for assessing PA pressure.  3. The mitral valve is normal in structure. No evidence of mitral valve regurgitation.  4. The aortic valve is tricuspid. Aortic valve regurgitation is not visualized. No aortic stenosis is present.  5. The inferior vena cava is normal in size with greater than 50% respiratory variability, suggesting right atrial pressure of 3 mmHg.  6. Agitated saline contrast bubble study was negative, with no evidence of any interatrial shunt. FINDINGS  Left Ventricle: Left ventricular ejection fraction, by estimation, is 55 to 60%. The left ventricle has normal function. The left ventricle has no regional wall motion abnormalities. The left ventricular internal cavity size was normal in size. There is  mild left ventricular hypertrophy. Left ventricular diastolic parameters are indeterminate. Right Ventricle:  The right ventricular size is normal. No increase in right ventricular wall thickness. Right ventricular systolic function is normal. Tricuspid regurgitation signal is inadequate for assessing PA pressure. Left Atrium: Left atrial size was normal in size. Right Atrium: Right atrial size was normal in size. Pericardium: There is no evidence of pericardial effusion. Mitral Valve: The mitral valve is normal in structure. No evidence of mitral valve regurgitation. Tricuspid Valve: The tricuspid valve is normal in structure. Tricuspid valve regurgitation is not demonstrated. Aortic Valve: The aortic valve is tricuspid. Aortic valve regurgitation is not visualized. No aortic stenosis is present. Aortic valve mean gradient measures 6.0 mmHg. Aortic valve peak gradient measures 11.7 mmHg. Aortic valve area, by VTI measures 3.08  cm. Pulmonic Valve: The pulmonic valve was grossly normal. Pulmonic valve regurgitation is not visualized. Aorta: The aortic root and ascending aorta are  structurally normal, with no evidence of dilitation. Venous: The inferior vena cava is normal in size with greater than 50% respiratory variability, suggesting right atrial pressure of 3 mmHg. IAS/Shunts: No atrial level shunt detected by color flow Doppler. Agitated saline contrast was given intravenously to evaluate for intracardiac shunting. Agitated saline contrast bubble study was negative, with no evidence of any interatrial shunt.  LEFT VENTRICLE PLAX 2D LVIDd:         4.40 cm  Diastology LVIDs:         3.10 cm  LV e' medial:    6.96 cm/s LV PW:         1.20 cm  LV E/e' medial:  10.5 LV IVS:        1.30 cm  LV e' lateral:   8.16 cm/s LVOT diam:     2.10 cm  LV E/e' lateral: 8.9 LV SV:         85 LV SV Index:   36 LVOT Area:     3.46 cm  RIGHT VENTRICLE             IVC RV Basal diam:  3.30 cm     IVC diam: 1.40 cm RV S prime:     19.90 cm/s TAPSE (M-mode): 3.1 cm LEFT ATRIUM             Index       RIGHT ATRIUM           Index LA diam:        3.60 cm 1.53 cm/m  RA Area:     15.00 cm LA Vol (A2C):   57.8 ml 24.61 ml/m RA Volume:   38.10 ml  16.22 ml/m LA Vol (A4C):   56.3 ml 23.97 ml/m LA Biplane Vol: 59.6 ml 25.38 ml/m  AORTIC VALVE AV Area (Vmax):    2.96 cm AV Area (Vmean):   3.12 cm AV Area (VTI):     3.08 cm AV Vmax:           171.00 cm/s AV Vmean:          120.000 cm/s AV VTI:            0.277 m AV Peak Grad:      11.7 mmHg AV Mean Grad:      6.0 mmHg LVOT Vmax:         146.00 cm/s LVOT Vmean:        108.000 cm/s LVOT VTI:          0.246 m LVOT/AV VTI ratio: 0.89  AORTA Ao Root diam: 3.30 cm Ao Asc diam:  3.30 cm  MITRAL VALVE MV Area (PHT): 3.19 cm    SHUNTS MV Decel Time: 238 msec    Systemic VTI:  0.25 m MV E velocity: 72.80 cm/s  Systemic Diam: 2.10 cm MV A velocity: 70.70 cm/s MV E/A ratio:  1.03 Oswaldo Milian MD Electronically signed by Oswaldo Milian MD Signature Date/Time: 03/22/2021/1:42:32 PM    Final        Subjective: Patient is feeling better, no nausea or  vomiting, no chest pain or dyspnea.   Discharge Exam: Vitals:   03/24/21 0445 03/24/21 0806  BP: (!) 142/79 (!) 146/112  Pulse:  86  Resp: 16 18  Temp: 97.9 F (36.6 C) 97.8 F (36.6 C)  SpO2: 97% 99%   Vitals:   03/23/21 2100 03/24/21 0050 03/24/21 0445 03/24/21 0806  BP: (!) 137/95 (!) 147/102 (!) 142/79 (!) 146/112  Pulse:    86  Resp: 16 18 16 18   Temp: 98.8 F (37.1 C) 98.5 F (36.9 C) 97.9 F (36.6 C) 97.8 F (36.6 C)  TempSrc: Oral Oral Oral Oral  SpO2: 97% 98% 97% 99%  Weight:      Height:        General: Not in pain or dyspnea  Neurology: Awake and alert, non focal  E ENT: no pallor, no icterus, oral mucosa moist Cardiovascular: No JVD. S1-S2 present, rhythmic, no gallops, rubs, or murmurs. No lower extremity edema. Pulmonary: positive breath sounds bilaterally, adequate air movement, no wheezing, rhonchi or rales. Gastrointestinal. Abdomen soft and non tender Skin. No rashes Musculoskeletal: no joint deformities   The results of significant diagnostics from this hospitalization (including imaging, microbiology, ancillary and laboratory) are listed below for reference.     Microbiology: Recent Results (from the past 240 hour(s))  Resp Panel by RT-PCR (Flu A&B, Covid) Nasopharyngeal Swab     Status: None   Collection Time: 03/20/21  3:00 PM   Specimen: Nasopharyngeal Swab; Nasopharyngeal(NP) swabs in vial transport medium  Result Value Ref Range Status   SARS Coronavirus 2 by RT PCR NEGATIVE NEGATIVE Final    Comment: (NOTE) SARS-CoV-2 target nucleic acids are NOT DETECTED.  The SARS-CoV-2 RNA is generally detectable in upper respiratory specimens during the acute phase of infection. The lowest concentration of SARS-CoV-2 viral copies this assay can detect is 138 copies/mL. A negative result does not preclude SARS-Cov-2 infection and should not be used as the sole basis for treatment or other patient management decisions. A negative result may occur  with  improper specimen collection/handling, submission of specimen other than nasopharyngeal swab, presence of viral mutation(s) within the areas targeted by this assay, and inadequate number of viral copies(<138 copies/mL). A negative result must be combined with clinical observations, patient history, and epidemiological information. The expected result is Negative.  Fact Sheet for Patients:  EntrepreneurPulse.com.au  Fact Sheet for Healthcare Providers:  IncredibleEmployment.be  This test is no t yet approved or cleared by the [place] FDA and  has been authorized for detection and/or diagnosis of SARS-CoV-2 by FDA under an Emergency Use Authorization (EUA). This EUA will remain  in effect (meaning this test can be used) for the duration of the COVID-19 declaration under Section 564(b)(1) of the Act, 21 U.S.C.section 360bbb-3(b)(1), unless the authorization is terminated  or revoked sooner.       Influenza A by PCR NEGATIVE NEGATIVE Final   Influenza B by PCR NEGATIVE NEGATIVE Final    Comment: (NOTE) The Xpert Xpress SARS-CoV-2/FLU/RSV plus assay is intended as an aid in the  diagnosis of influenza from Nasopharyngeal swab specimens and should not be used as a sole basis for treatment. Nasal washings and aspirates are unacceptable for Xpert Xpress SARS-CoV-2/FLU/RSV testing.  Fact Sheet for Patients: EntrepreneurPulse.com.au  Fact Sheet for Healthcare Providers: IncredibleEmployment.be  This test is not yet approved or cleared by the [place] FDA and has been authorized for detection and/or diagnosis of SARS-CoV-2 by FDA under an Emergency Use Authorization (EUA). This EUA will remain in effect (meaning this test can be used) for the duration of the COVID-19 declaration under Section 564(b)(1) of the Act, 21 U.S.C. section 360bbb-3(b)(1), unless the authorization is terminated  or revoked.  Performed at [facility], [place] [address] [place], [place] [address]      Labs: BNP (last 3 results) Recent Labs    01/13/21 1235  BNP 86.7   Basic Metabolic Panel: Recent Labs  Lab 03/20/21 1210 03/23/21 0258  NA 135 139  K 4.2 4.7  CL 99 105  CO2 26 24  GLUCOSE 156* 107*  BUN 28* 16  CREATININE 1.17 1.24  CALCIUM 9.7 9.2   Liver Function Tests: Recent Labs  Lab 03/20/21 1210  AST 27  ALT 42  ALKPHOS 48  BILITOT 0.9  PROT 8.4*  ALBUMIN 4.5   No results for input(s): LIPASE, AMYLASE in the last 168 hours. No results for input(s): AMMONIA in the last 168 hours. CBC: Recent Labs  Lab 03/20/21 1210 03/21/21 0550 03/22/21 0310 03/23/21 0258 03/24/21 0303  WBC 10.1 10.6* 11.7* 13.0* 15.8*  NEUTROABS 9.2*  --   --   --   --   HGB 15.4 14.6 15.0 14.9 15.5  HCT 46.9 45.3 46.3 45.3 47.5  MCV 92.5 93.8 93.0 93.0 92.6  PLT 286 263 280 250 267   Cardiac Enzymes: No results for input(s): CKTOTAL, CKMB, CKMBINDEX, TROPONINI in the last 168 hours. BNP: Invalid input(s): POCBNP CBG: No results for input(s): GLUCAP in the last 168 hours. D-Dimer No results for input(s): DDIMER in the last 72 hours. Hgb A1c No results for input(s): HGBA1C in the last 72 hours. Lipid Profile No results for input(s): CHOL, HDL, LDLCALC, TRIG, CHOLHDL, LDLDIRECT in the last 72 hours. Thyroid function studies No results for input(s): TSH, T4TOTAL, T3FREE, THYROIDAB in the last 72 hours.  Invalid input(s): FREET3 Anemia work up No results for input(s): VITAMINB12, FOLATE, FERRITIN, TIBC, IRON, RETICCTPCT in the last 72 hours. Urinalysis    Component Value Date/Time   COLORURINE YELLOW 03/20/2021 2350   APPEARANCEUR CLEAR 03/20/2021 2350   LABSPEC 1.040 (H) 03/20/2021 2350   PHURINE 7.0 03/20/2021 2350   GLUCOSEU NEGATIVE 03/20/2021 2350   HGBUR NEGATIVE 03/20/2021 2350   BILIRUBINUR NEGATIVE 03/20/2021 2350   KETONESUR NEGATIVE 03/20/2021  2350   PROTEINUR NEGATIVE 03/20/2021 2350   NITRITE NEGATIVE 03/20/2021 2350   LEUKOCYTESUR NEGATIVE 03/20/2021 2350   Sepsis Labs Invalid input(s): PROCALCITONIN,  WBC,  LACTICIDVEN Microbiology Recent Results (from the past 240 hour(s))  Resp Panel by RT-PCR (Flu A&B, Covid) Nasopharyngeal Swab     Status: None   Collection Time: 03/20/21  3:00 PM   Specimen: Nasopharyngeal Swab; Nasopharyngeal(NP) swabs in vial transport medium  Result Value Ref Range Status   SARS Coronavirus 2 by RT PCR NEGATIVE NEGATIVE Final    Comment: (NOTE) SARS-CoV-2 target nucleic acids are NOT DETECTED.  The SARS-CoV-2 RNA is generally detectable in upper respiratory specimens during the acute phase of infection. The lowest concentration of SARS-CoV-2 viral copies this  assay can detect is 138 copies/mL. A negative result does not preclude SARS-Cov-2 infection and should not be used as the sole basis for treatment or other patient management decisions. A negative result may occur with  improper specimen collection/handling, submission of specimen other than nasopharyngeal swab, presence of viral mutation(s) within the areas targeted by this assay, and inadequate number of viral copies(<138 copies/mL). A negative result must be combined with clinical observations, patient history, and epidemiological information. The expected result is Negative.  Fact Sheet for Patients:  EntrepreneurPulse.com.au  Fact Sheet for Healthcare Providers:  IncredibleEmployment.be  This test is no t yet approved or cleared by the [place] FDA and  has been authorized for detection and/or diagnosis of SARS-CoV-2 by FDA under an Emergency Use Authorization (EUA). This EUA will remain  in effect (meaning this test can be used) for the duration of the COVID-19 declaration under Section 564(b)(1) of the Act, 21 U.S.C.section 360bbb-3(b)(1), unless the authorization is terminated  or  revoked sooner.       Influenza A by PCR NEGATIVE NEGATIVE Final   Influenza B by PCR NEGATIVE NEGATIVE Final    Comment: (NOTE) The Xpert Xpress SARS-CoV-2/FLU/RSV plus assay is intended as an aid in the diagnosis of influenza from Nasopharyngeal swab specimens and should not be used as a sole basis for treatment. Nasal washings and aspirates are unacceptable for Xpert Xpress SARS-CoV-2/FLU/RSV testing.  Fact Sheet for Patients: EntrepreneurPulse.com.au  Fact Sheet for Healthcare Providers: IncredibleEmployment.be  This test is not yet approved or cleared by the [place] FDA and has been authorized for detection and/or diagnosis of SARS-CoV-2 by FDA under an Emergency Use Authorization (EUA). This EUA will remain in effect (meaning this test can be used) for the duration of the COVID-19 declaration under Section 564(b)(1) of the Act, 21 U.S.C. section 360bbb-3(b)(1), unless the authorization is terminated or revoked.  Performed at [facility], [place] [address], [place], [place] [address]      Time coordinating discharge: 45 minutes  SIGNED:   Tawni Millers, MD  [facility] 03/24/2021, 8:44 AM

## 2021-03-24 NOTE — Plan of Care (Signed)
Self administered lovenox with good technique proper hygiene.

## 2021-03-24 NOTE — Progress Notes (Signed)
[place] for Heparin switch to Lovenox bridge / Warfarin Indication: stroke  Allergies  Allergen Reactions  . Diltiazem Hcl Diarrhea and Other (See Comments)    Lethargic    Patient Measurements: Height: 6\' 2"  (188 cm) Weight: 108.9 kg (240 lb) IBW/kg (Calculated) : 82.2 Heparin Dosing Weight: 104.6 kg  Vital Signs: Temp: 97.8 F (36.6 C) (04/25 0806) Temp Source: Oral (04/25 0806) BP: 146/112 (04/25 0806) Pulse Rate: 86 (04/25 0806)  Labs: Recent Labs    03/22/21 0310 03/23/21 0258 03/24/21 0303  HGB 15.0 14.9 15.5  HCT 46.3 45.3 47.5  PLT 280 250 267  LABPROT 12.5 12.5 14.8  INR 0.9 0.9 1.2  HEPARINUNFRC 0.28* 0.37 0.43  CREATININE  --  1.24  --     Estimated Creatinine Clearance: 95.7 mL/min (by C-G formula based on SCr of 1.24 mg/dL).   Medications:  Scheduled:  Infusions:   Assessment: 49 yoM to ED with mild word-finding difficulty, no other deficit. 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  Continues on Warfarin and Heparin  Heparin level therapeutic this AM, INR sub therapeutic although increased from 0.9 to 1.2 today.  CBC stable.  No bleeding reported.   Pharmacy consulted to switch from IV heparin drip t SQ Lovenox bridge to Warfarin .   Goal of Therapy:  antiXa level  Monitor platelets by anticoagulation protocol: Yes  INR goal 2.5 to 3.5 (goal per MD)   Plan:  Discontinue IV Heparin >switch to Lovenox bridge at dose of  110 mg sq q12hr until 24 hr post therapeutic INR. Goal INR 2.5-3.5 Warfarin 12.5 mg po x 1 Coumadin ed completed 4/24.  Daily labs  Thank you Nicole Cella, [place] Clinical Pharmacist [phone] Please check AMION for all [place] phone numbers After 10:00 PM, call [place] [phone] 03/24/2021 9:26 AM

## 2021-03-24 NOTE — Plan of Care (Signed)
  Problem: Education: Goal: Knowledge of General Education information will improve Description: Including pain rating scale, medication(s)/side effects and non-pharmacologic comfort measures Outcome: Progressing   Problem: Health Behavior/Discharge Planning: Goal: Ability to manage health-related needs will improve Outcome: Progressing   Problem: Clinical Measurements: Goal: Ability to maintain clinical measurements within normal limits will improve Outcome: Progressing Goal: Will remain free from infection Outcome: Progressing Goal: Respiratory complications will improve Outcome: Progressing Goal: Cardiovascular complication will be avoided Outcome: Progressing   Problem: Activity: Goal: Risk for activity intolerance will decrease Outcome: Progressing   Problem: Nutrition: Goal: Adequate nutrition will be maintained Outcome: Progressing   Problem: Coping: Goal: Level of anxiety will decrease Outcome: Progressing   Problem: Elimination: Goal: Will not experience complications related to bowel motility Outcome: Progressing Goal: Will not experience complications related to urinary retention Outcome: Progressing   Problem: Pain Managment: Goal: General experience of comfort will improve Outcome: Progressing   Problem: Safety: Goal: Ability to remain free from injury will improve Outcome: Progressing   Problem: Skin Integrity: Goal: Risk for impaired skin integrity will decrease Outcome: Progressing   Problem: Education: Goal: Knowledge of secondary prevention will improve Outcome: Progressing Goal: Knowledge of patient specific risk factors addressed and post discharge goals established will improve Outcome: Progressing

## 2021-03-24 NOTE — Telephone Encounter (Signed)
Patient is scheduled for a PFT and OV with Dr. Erin Fulling for 03/25/21. Patient was recently discharged from the hospital. I called patient to see if he wanted to reschedule his PFT and OV but he did not answer. Left message for patient to call back.

## 2021-03-24 NOTE — TOC Transition Note (Signed)
Transition of Care [facility]) - CM/SW Discharge Note   Patient Details  Name: Wesley Hancock MRN: [id number] Date of Birth: December 25, 1970  [facility] CM/SW Contact:  Pollie Friar, RN Phone Number: 03/24/2021, 10:47 AM   Clinical Narrative:    Patient discharging home with outpatient rehab through [facility]. Coumadin clinic appt scheduled and placed on the AVS. Pt has transport home.   Final next level of care: OP Rehab Barriers to Discharge: No Barriers Identified   Patient Goals and CMS Choice     Choice offered to / list presented to : Patient  Discharge Placement                       Discharge Plan and Services   Discharge Planning Services: CM Consult                                 Social Determinants of Health (SDOH) Interventions     Readmission Risk Interventions No flowsheet data found.

## 2021-03-25 ENCOUNTER — Ambulatory Visit: Payer: BC Managed Care – PPO | Admitting: Pulmonary Disease

## 2021-03-25 ENCOUNTER — Other Ambulatory Visit: Payer: Self-pay

## 2021-03-25 ENCOUNTER — Ambulatory Visit (INDEPENDENT_AMBULATORY_CARE_PROVIDER_SITE_OTHER): Payer: BC Managed Care – PPO | Admitting: Pulmonary Disease

## 2021-03-25 ENCOUNTER — Encounter: Payer: Self-pay | Admitting: Pulmonary Disease

## 2021-03-25 ENCOUNTER — Telehealth: Payer: Self-pay

## 2021-03-25 VITALS — BP 138/88 | HR 109 | Temp 97.3°F | Ht 74.0 in | Wt 282.0 lb

## 2021-03-25 DIAGNOSIS — K219 Gastro-esophageal reflux disease without esophagitis: Secondary | ICD-10-CM

## 2021-03-25 DIAGNOSIS — D6862 Lupus anticoagulant syndrome: Secondary | ICD-10-CM

## 2021-03-25 DIAGNOSIS — J849 Interstitial pulmonary disease, unspecified: Secondary | ICD-10-CM | POA: Diagnosis not present

## 2021-03-25 DIAGNOSIS — M351 Other overlap syndromes: Secondary | ICD-10-CM

## 2021-03-25 DIAGNOSIS — G4733 Obstructive sleep apnea (adult) (pediatric): Secondary | ICD-10-CM

## 2021-03-25 DIAGNOSIS — Z9989 Dependence on other enabling machines and devices: Secondary | ICD-10-CM

## 2021-03-25 LAB — PULMONARY FUNCTION TEST
DL/VA % pred: 96 %
DL/VA: 4.23 ml/min/mmHg/L
DLCO cor % pred: 74 %
DLCO cor: 25.42 ml/min/mmHg
DLCO unc % pred: 76 %
DLCO unc: 26.04 ml/min/mmHg
FEF 25-75 Post: 5.51 L/sec
FEF 25-75 Pre: 5.27 L/sec
FEF2575-%Change-Post: 4 %
FEF2575-%Pred-Post: 137 %
FEF2575-%Pred-Pre: 131 %
FEV1-%Change-Post: 0 %
FEV1-%Pred-Post: 80 %
FEV1-%Pred-Pre: 79 %
FEV1-Post: 3.67 L
FEV1-Pre: 3.66 L
FEV1FVC-%Change-Post: 3 %
FEV1FVC-%Pred-Pre: 112 %
FEV6-%Change-Post: -3 %
FEV6-%Pred-Post: 70 %
FEV6-%Pred-Pre: 73 %
FEV6-Post: 4.02 L
FEV6-Pre: 4.17 L
FEV6FVC-%Pred-Post: 103 %
FEV6FVC-%Pred-Pre: 103 %
FVC-%Change-Post: -3 %
FVC-%Pred-Post: 68 %
FVC-%Pred-Pre: 70 %
FVC-Post: 4.02 L
FVC-Pre: 4.17 L
Post FEV1/FVC ratio: 91 %
Post FEV6/FVC ratio: 100 %
Pre FEV1/FVC ratio: 88 %
Pre FEV6/FVC Ratio: 100 %
RV % pred: 74 %
RV: 1.67 L
TLC % pred: 77 %
TLC: 6.11 L

## 2021-03-25 MED ORDER — PANTOPRAZOLE SODIUM 40 MG PO TBEC: 40.0000 mg | DELAYED_RELEASE_TABLET | Freq: Every day | ORAL | 3 refills | Status: DC

## 2021-03-25 MED ORDER — PREDNISONE 10 MG PO TABS: 10.0000 mg | ORAL_TABLET | Freq: Every day | ORAL | 0 refills | Status: DC

## 2021-03-25 MED ORDER — PREDNISONE 20 MG PO TABS: 20.0000 mg | ORAL_TABLET | Freq: Every day | ORAL | 0 refills | Status: DC

## 2021-03-25 MED ORDER — SULFAMETHOXAZOLE-TRIMETHOPRIM 400-80 MG PO TABS: 1.0000 | ORAL_TABLET | Freq: Every day | ORAL | 2 refills | Status: DC

## 2021-03-25 NOTE — Progress Notes (Signed)
Full PFT performed today. °

## 2021-03-25 NOTE — Patient Instructions (Addendum)
Taper down to 30mg  of prednisone daily until May 25, then taper down to 20mg  daily of prednisone   Continue taking Bactrim 1 tablet daily. Please talk to your coumadin clinic that you are on this medication. It can interact with your coumadin levels and raise your INR.  Take protonix 40mg  daily

## 2021-03-25 NOTE — Patient Instructions (Signed)
Full PFT performed today. °

## 2021-03-25 NOTE — Progress Notes (Signed)
Synopsis: Pulmonary follow up for ILD diagnosis in 12/2020  Subjective:   PATIENT ID: Wesley Hancock GENDER: male DOB: 08/29/1971, MRN: [id number]   HPI  Chief Complaint  Patient presents with  . Follow-up    F/U after PFT. Was recently in the hospital for a stroke, discharged yesterday. States he has been doing well since last visit despite stroke.    Wesley Hancock is a 50 year old male, former smoker with history of DVT/PE due to antiphospholipid syndrome diagnosed in September 2020 and later with SLE and mixed connective tissue disease with interstitial lung disease features in February 2022.   He was started on steroid taper in February, initially on 60mg  daily with bactrim prophylaxis. He was tapered to 40mg  daily at last visit 01/30/21 and started on mycophenolate 500mg  twice daily. He has tolerated the mycophenolate well and his lab values remain in normal range. He reports stopping bactrim at the last visit.  He was recently admitted with aphasia on 03/20/21 and found to have an acute CVA involving the left frontal cortex and operculum despite being on lovenox therapy for the antiphospholipid syndrome. He was placed on heparin drip and bridged to coumadin with resolution of his symptoms. He reports some residual word loss currently.  In regards to his breathing, he reports significant improvement since starting steroids and mycophenolate. He is back to outdoor activities and has been able to go fishing multiple times without being held back by dyspnea. He has also been able to mow his lawn without issue.  He is scheduled for a thyroid biopsy on 02/05/21 and is still awaiting a CPAP machine from the Sleep Clinic at [facility].    He is scheduled to see Dr. [name] of Rheumatology tomorrow.   Past Medical History:  Diagnosis Date  . Acute nonintractable headache 09/06/2019  . Acute respiratory failure with hypoxia ([place]) 08/30/2019  . AKI (acute kidney injury) ([place])  08/30/2019  . Antiphospholipid syndrome ([place]) 10/30/2019  . Anxiety   . APS (antiphospholipid syndrome) ([place])   . Arthritis   . Colon polyp    ? hyperplastic  . Depression   . Diarrhea 05/21/2020  . DVT (deep venous thrombosis) ([place])   . Dyspnea on exertion 09/08/2017   "Quit smoking" 2015 with onset of symptoms in 2016  Spirometry 09/08/2017  Flat f/v loop  - d/c acei 09/08/2017  - 01/04/2020   Walked RA x two laps =  approx 537ft @ fast pace - stopped due to end of study/ min sob with sats of 93 % at the end of the study. - PFT's  03/08/20   FEV1 3.84 (83 % ) ratio 0..90  p 3 % improvement from saba p ? prior to study with DLCO  26.46 (77%) corrects to 4.76 (99%)    . Esophageal spasm   . Essential hypertension 09/23/2014   D/c acei 09/08/2017 due to pseudocopd   . GAD (generalized anxiety disorder) 08/06/2016  . Lobar pneumonia ([place]) 08/30/2019  . Lung nodule 09/21/2014  . Lupus ([place])   . Mixed hyperlipidemia 09/18/2015  . Morbid obesity due to excess calories ([place]) c/b hbp/ dvt/PE 09/10/2019  . Multiple pulmonary emboli ([place]) 08/22/2019   CTa pos bilateral PE  08/22/19 in setting of obesity/ truck driving and R DVT with nl echo  - referred to hematology by PCP > dx antiphospholipid syndrome/ changed to lovenox 09/29/2019  . Nutcracker esophagus   . Paresthesia of right foot 11/08/2019  . Pulmonary infiltrates 09/10/2019  In setting of bilateral PE 08/23/2019 with antiphospholipid syndrome   . Recurrent pneumonia 07/09/2020   Formatting of this note might be different from the original. 06/26/20, 03/08/20  . Snoring 07/09/2020  . Upper airway cough syndrome 01/05/2020   Onset mid Jan 2021 while on otc PPI  - max rx for gerd 01/04/2020 >>>     . Witnessed apneic spells 07/09/2020     Family History  Problem Relation Age of Onset  . Diabetes Mother   . Hyperlipidemia Mother   . Hypertension Mother   . Thyroid disease Mother        uncertain type--had surg--no cancer  . Heart disease Father 1   . Hyperlipidemia Father   . Hypertension Father   . Prostate cancer Father 39  . Lung cancer Father 3       Dx 06/18/2017  . Colon polyps Father   . Irritable bowel syndrome Father   . Diverticulitis Father   . Diabetes Sister   . Hyperlipidemia Sister   . Hypertension Sister   . Diabetes Maternal Grandmother   . Heart disease Maternal Grandmother   . Hyperlipidemia Maternal Grandmother   . Hypertension Maternal Grandmother   . Colon cancer Maternal Grandmother   . Heart disease Maternal Grandfather   . Hyperlipidemia Maternal Grandfather   . Hypertension Maternal Grandfather   . Stroke Maternal Grandfather   . Liver cancer Maternal Grandfather   . Irritable bowel syndrome Maternal Grandfather   . Heart disease Paternal Grandmother   . Healthy Son   . Healthy Daughter   . Healthy Daughter      Social History   Socioeconomic History  . Marital status: Married    Spouse name: Not on file  . Number of children: 3  . Years of education: Not on file  . Highest education level: Not on file  Occupational History  . Occupation: Truck Geophysicist/field seismologist  Tobacco Use  . Smoking status: Former Smoker    Packs/day: 1.00    Years: 20.00    Pack years: 20.00    Types: Cigarettes    Quit date: 11/30/2013    Years since quitting: 7.3  . Smokeless tobacco: Former Systems developer    Types: Snuff    Quit date: 05/21/2018  Vaping Use  . Vaping Use: Never used  Substance and Sexual Activity  . Alcohol use: Yes    Alcohol/week: 5.0 standard drinks    Types: 5 Cans of beer per week    Comment: 1 per day  . Drug use: No  . Sexual activity: Yes  Other Topics Concern  . Not on file  Social History Narrative   Married (03/2016)   Lives in [place], [place] (Originally from [place] )   Some family in [place] live in [place].   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   Did play minor [place] baseball. ([place]   Likes to hunt, fish and plays in competitive soft  ball league (slow pitch)   Interest includes trips to [employer] and collecting colorful socks.   Social Determinants of Health   Financial Resource Strain: Not on file  Food Insecurity: Not on file  Transportation Needs: Not on file  Physical Activity: Not on file  Stress: Not on file  Social Connections: Not on file  Intimate Partner Violence: Not on file     Allergies  Allergen Reactions  . Diltiazem Hcl Diarrhea and Other (See Comments)  Lethargic      Outpatient Medications Prior to Visit  Medication Sig Dispense Refill  . acetaminophen (TYLENOL) 500 MG tablet Take 1,000 mg by mouth every 6 (six) hours as needed for mild pain.    Marland Kitchen buPROPion (WELLBUTRIN XL) 150 MG 24 hr tablet Take 1 tablet daily (Patient taking differently: Take 150 mg by mouth daily.) 90 tablet 3  . escitalopram (LEXAPRO) 20 MG tablet TAKE 1 TABLET BY MOUTH EVERY DAY (Patient taking differently: Take 20 mg by mouth daily.) 90 tablet 1  . fluticasone (FLONASE) 50 MCG/ACT nasal spray Place 1 spray into both nostrils daily as needed for allergies.    Marland Kitchen ibuprofen (ADVIL) 200 MG tablet Take 400 mg by mouth every 6 (six) hours as needed for fever, headache or mild pain.    . Probiotic Product (PROBIOTIC PO) Take 1 capsule by mouth daily.    . rosuvastatin (CRESTOR) 40 MG tablet Take 1 tablet (40 mg total) by mouth daily. 30 tablet 0  . telmisartan-hydrochlorothiazide (MICARDIS HCT) 80-25 MG tablet TAKE 1 TABLET BY MOUTH EVERY DAY (Patient taking differently: Take 1 tablet by mouth daily.) 90 tablet 2  . warfarin (COUMADIN) 5 MG tablet Take 2 tablets (10 mg total) by mouth daily. Start on 03/25/21 30 tablet 0  . enoxaparin (LOVENOX) 120 MG/0.8ML injection Inject 0.74 mLs (110 mg total) into the skin every 12 (twelve) hours. (Patient not taking: Reported on 03/26/2021) 44.4 mL 0  . mycophenolate (CELLCEPT) 500 MG tablet Take 1 tablet (500 mg total) by mouth 2 (two) times daily. 60 tablet 6  . pantoprazole  (PROTONIX) 40 MG tablet Take 1 tablet (40 mg total) by mouth 2 (two) times daily. 180 tablet 1  . predniSONE (DELTASONE) 20 MG tablet TAKE 2 TABLETS (40 MG TOTAL) BY MOUTH DAILY. 60 tablet 0  . ezetimibe (ZETIA) 10 MG tablet Take 1 tablet (10 mg total) by mouth daily. 90 tablet 3   No facility-administered medications prior to visit.    Review of Systems  Constitutional: Negative for chills, fever, malaise/fatigue and weight loss.  HENT: Negative for congestion, sinus pain and sore throat.   Eyes: Negative.   Respiratory: Negative for cough, hemoptysis, sputum production, shortness of breath and wheezing.   Cardiovascular: Negative for chest pain, palpitations, orthopnea, claudication and leg swelling.  Gastrointestinal: Negative for abdominal pain, heartburn, nausea and vomiting.  Genitourinary: Negative.   Musculoskeletal: Negative for joint pain and myalgias.  Skin: Negative for rash.  Neurological: Negative for weakness.  Endo/Heme/Allergies: Negative.   Psychiatric/Behavioral: Negative.     Objective:   Vitals:   03/25/21 1008  BP: 138/88  Pulse: (!) 109  Temp: (!) 97.3 F (36.3 C)  TempSrc: Temporal  SpO2: 96%  Weight: 282 lb (127.9 kg)  Height: 6\' 2"  (1.88 m)     Physical Exam Constitutional:      General: He is not in acute distress.    Appearance: He is obese.  HENT:     Head: Normocephalic and atraumatic.     Mouth/Throat:     Mouth: Mucous membranes are moist.     Pharynx: Oropharynx is clear.  Eyes:     Extraocular Movements: Extraocular movements intact.     Conjunctiva/sclera: Conjunctivae normal.     Pupils: Pupils are equal, round, and reactive to light.  Cardiovascular:     Rate and Rhythm: Normal rate and regular rhythm.     Pulses: Normal pulses.     Heart sounds: Normal heart sounds. No  murmur heard.   Pulmonary:     Effort: Pulmonary effort is normal.     Breath sounds: Rales (fine, bilateral bases, and right anterior) present. No  wheezing or rhonchi.  Abdominal:     General: Bowel sounds are normal.     Palpations: Abdomen is soft.  Musculoskeletal:        General: No swelling or tenderness.     Right lower leg: No edema.     Left lower leg: No edema.  Lymphadenopathy:     Cervical: No cervical adenopathy.  Skin:    General: Skin is warm and dry.  Neurological:     General: No focal deficit present.     Mental Status: He is alert.  Psychiatric:        Mood and Affect: Mood normal.        Behavior: Behavior normal.        Thought Content: Thought content normal.        Judgment: Judgment normal.    CBC    Component Value Date/Time   WBC 15.8 (H) 03/24/2021 0303   RBC 5.13 03/24/2021 0303   HGB 15.5 03/24/2021 0303   HGB 15.0 10/21/2020 0811   HCT 47.5 03/24/2021 0303   PLT 267 03/24/2021 0303   PLT 209 10/21/2020 0811   MCV 92.6 03/24/2021 0303   MCH 30.2 03/24/2021 0303   MCHC 32.6 03/24/2021 0303   RDW 13.3 03/24/2021 0303   LYMPHSABS 0.6 (L) 03/20/2021 1210   MONOABS 0.2 03/20/2021 1210   EOSABS 0.0 03/20/2021 1210   BASOSABS 0.0 03/20/2021 1210   BMP Latest Ref Rng & Units 03/23/2021 03/20/2021 02/01/2021  Glucose 70 - 99 mg/dL 107(H) 156(H) 98  BUN 6 - 20 mg/dL 16 28(H) 28(H)  Creatinine 0.61 - 1.24 mg/dL 1.24 1.17 1.44(H)  BUN/Creat Ratio 9 - 20 - - -  Sodium 135 - 145 mmol/L 139 135 132(L)  Potassium 3.5 - 5.1 mmol/L 4.7 4.2 4.1  Chloride 98 - 111 mmol/L 105 99 96(L)  CO2 22 - 32 mmol/L 24 26 26   Calcium 8.9 - 10.3 mg/dL 9.2 9.7 8.7(L)   Labs: 01/13/2021 ANA Positive dsDNA 177 IU/mL (0-9IU/mL) Ribnucleic Protien 2.4 (0- 0.9AI)  01/15/21 MyoMarker 3 Panel is negative  Chest imaging: CTA Chest 01/13/21 1. No demonstrable pulmonary embolus. No thoracic aortic aneurysm or dissection. There are foci of aortic atherosclerosis as well as foci of coronary artery calcification.  2. Extensive airspace opacity bilaterally with overall increased compared to December 2021. Areas of  patchy consolidation noted in several areas. The overall appearance is most indicative of atypical organism pneumonia. A degree of bacterial superinfection is question. Note that there has been persistence of multifocal airspace opacity for several months. This circumstance may warrant Pulmonary Medicine consultation with consideration for bronchoscopy for further assessment with respect to etiology for the parenchymal lung lesions.  3. Stable adenopathy of uncertain etiology. Given the parenchymal lung changes, this adenopathy could have reactive etiology.  4. Hepatic steatosis. Liver appears prominent although incompletely visualized. Gallbladder absent.  5. Thyroid nodules with assessment by thyroid ultrasound 6 days prior. Please see recent thyroid ultrasound report with respect to thyroid nodular assessment.  PFT: PFT Results Latest Ref Rng & Units 03/25/2021 03/08/2020  FVC-Pre L 4.17 4.31  FVC-Predicted Pre % 70 72  FVC-Post L 4.02 4.29  FVC-Predicted Post % 68 72  Pre FEV1/FVC % % 88 86  Post FEV1/FCV % % 91 90  FEV1-Pre L 3.66 3.71  FEV1-Predicted Pre % 79 80  FEV1-Post L 3.67 3.84  DLCO uncorrected ml/min/mmHg 26.04 26.46  DLCO UNC% % 76 77  DLCO corrected ml/min/mmHg 25.42 26.46  DLCO COR %Predicted % 74 77  DLVA Predicted % 96 99  TLC L 6.11 5.98  TLC % Predicted % 77 76  RV % Predicted % 74 67  03/25/21 mild restrictive defect and mild diffusion defect  Echo 07/31/20: 1. Left ventricular ejection fraction, by estimation, is 60 to 65%. The  left ventricle has normal function. The left ventricle has no regional  wall motion abnormalities. There is mild left ventricular hypertrophy.  Left ventricular diastolic parameters  were normal.  2. Right ventricular systolic function is normal. The right ventricular  size is normal.  3. Left atrial size was mildly dilated.  4. The mitral valve is normal in structure. No evidence of mitral valve  regurgitation. No  evidence of mitral stenosis.  5. The aortic valve is normal in structure. Aortic valve regurgitation is  not visualized. No aortic stenosis is present.  6. The inferior vena cava is normal in size with greater than 50%  respiratory variability, suggesting right atrial pressure of 3 mmHg.  NM Myocardial Stress Test 08/01/20  The left ventricular ejection fraction is normal (55-65%).  Nuclear stress EF: 59%. There were no wall motion abnormalities  There was no ST segment deviation noted during stress.  The study is normal.  This is a low risk study. No perfusion defects.  Assessment & Plan:   ILD (interstitial lung disease) ([place]) - Plan: sulfamethoxazole-trimethoprim (BACTRIM) 400-80 MG tablet, predniSONE (DELTASONE) 20 MG tablet, DISCONTINUED: predniSONE (DELTASONE) 10 MG tablet  OSA on CPAP  Mixed connective tissue disease ([name])  Lupus anticoagulant disorder ([name])  Gastroesophageal reflux disease without esophagitis - Plan: pantoprazole (PROTONIX) 40 MG tablet  Discussion: Wesley Hancock is a 50 year old male, former smoker with history of DVT/PE due to antiphospholipid syndrome diagnosed in September 2020 who presented with shortness of breath and chest pain 01/13/21 with workup showing signs of interstitial lung disease related to lupus/mixed connective tissue overlap syndrome.   He was treated with IV solumedrol in the hospital for 2 days and then transitioned to 60mg  of prednisone daily along with bactrim prophylaxis which was tapered to 40mg  daily on 01/30/21 with initiation of 500mg  of mycophenolate. He stopped bactrim after the last visit due to miscommunication. He is to resume bactrim prophylaxis until under 20mg  of prednisone daily. We will taper his steroids to 30mg  daily today and then to 20mg  on Apr 23, 2021.  He is to continue mycophenolate 500mg  twice daily and follow up with rheumatology tomorrow for further recommendations on titrating this medication.   He is  to follow up in 1 month and we will monitor labs at that time.   We will plan for a HRCT chest in August or September.  He is awaiting a CPAP machine from the [facility] sleep clinic. I believe he will greatly benefit from this when it arrives.   Freda Jackson, MD [facility] Office: [phone]   Current Outpatient Medications:  .  acetaminophen (TYLENOL) 500 MG tablet, Take 1,000 mg by mouth every 6 (six) hours as needed for mild pain., Disp: , Rfl:  .  buPROPion (WELLBUTRIN XL) 150 MG 24 hr tablet, Take 1 tablet daily (Patient taking differently: Take 150 mg by mouth daily.), Disp: 90 tablet, Rfl: 3 .  escitalopram (LEXAPRO) 20 MG tablet, TAKE 1 TABLET BY  MOUTH EVERY DAY (Patient taking differently: Take 20 mg by mouth daily.), Disp: 90 tablet, Rfl: 1 .  fluticasone (FLONASE) 50 MCG/ACT nasal spray, Place 1 spray into both nostrils daily as needed for allergies., Disp: , Rfl:  .  ibuprofen (ADVIL) 200 MG tablet, Take 400 mg by mouth every 6 (six) hours as needed for fever, headache or mild pain., Disp: , Rfl:  .  Probiotic Product (PROBIOTIC PO), Take 1 capsule by mouth daily., Disp: , Rfl:  .  rosuvastatin (CRESTOR) 40 MG tablet, Take 1 tablet (40 mg total) by mouth daily., Disp: 30 tablet, Rfl: 0 .  sulfamethoxazole-trimethoprim (BACTRIM) 400-80 MG tablet, Take 1 tablet by mouth daily., Disp: 30 tablet, Rfl: 2 .  telmisartan-hydrochlorothiazide (MICARDIS HCT) 80-25 MG tablet, TAKE 1 TABLET BY MOUTH EVERY DAY (Patient taking differently: Take 1 tablet by mouth daily.), Disp: 90 tablet, Rfl: 2 .  warfarin (COUMADIN) 5 MG tablet, Take 2 tablets (10 mg total) by mouth daily. Start on 03/25/21, Disp: 30 tablet, Rfl: 0 .  ezetimibe (ZETIA) 10 MG tablet, Take 1 tablet (10 mg total) by mouth daily., Disp: 90 tablet, Rfl: 3 .  hydroxychloroquine (PLAQUENIL) 200 MG tablet, Take 1 tablet (200 mg total) by mouth 2 (two) times daily., Disp: 180 tablet, Rfl: 0 .   mycophenolate (CELLCEPT) 500 MG tablet, Take 2 tablets (1,000 mg total) by mouth 2 (two) times daily., Disp: 120 tablet, Rfl: 6 .  pantoprazole (PROTONIX) 40 MG tablet, Take 1 tablet (40 mg total) by mouth daily., Disp: 90 tablet, Rfl: 3 .  predniSONE (DELTASONE) 20 MG tablet, Take 1 tablet (20 mg total) by mouth daily. (Patient taking differently: Take 40 mg by mouth daily.), Disp: 60 tablet, Rfl: 0

## 2021-03-26 ENCOUNTER — Ambulatory Visit: Payer: BC Managed Care – PPO | Admitting: Rheumatology

## 2021-03-26 ENCOUNTER — Ambulatory Visit (INDEPENDENT_AMBULATORY_CARE_PROVIDER_SITE_OTHER): Payer: BC Managed Care – PPO | Admitting: General Practice

## 2021-03-26 ENCOUNTER — Telehealth: Payer: Self-pay | Admitting: Pulmonary Disease

## 2021-03-26 ENCOUNTER — Encounter: Payer: Self-pay | Admitting: Rheumatology

## 2021-03-26 VITALS — BP 136/93 | HR 104 | Resp 16 | Ht 74.0 in | Wt 290.0 lb

## 2021-03-26 DIAGNOSIS — J849 Interstitial pulmonary disease, unspecified: Secondary | ICD-10-CM | POA: Diagnosis not present

## 2021-03-26 DIAGNOSIS — R5383 Other fatigue: Secondary | ICD-10-CM

## 2021-03-26 DIAGNOSIS — I119 Hypertensive heart disease without heart failure: Secondary | ICD-10-CM

## 2021-03-26 DIAGNOSIS — I639 Cerebral infarction, unspecified: Secondary | ICD-10-CM

## 2021-03-26 DIAGNOSIS — F411 Generalized anxiety disorder: Secondary | ICD-10-CM

## 2021-03-26 DIAGNOSIS — K224 Dyskinesia of esophagus: Secondary | ICD-10-CM

## 2021-03-26 DIAGNOSIS — J9601 Acute respiratory failure with hypoxia: Secondary | ICD-10-CM

## 2021-03-26 DIAGNOSIS — I2699 Other pulmonary embolism without acute cor pulmonale: Secondary | ICD-10-CM

## 2021-03-26 DIAGNOSIS — Z7901 Long term (current) use of anticoagulants: Secondary | ICD-10-CM | POA: Diagnosis not present

## 2021-03-26 DIAGNOSIS — J181 Lobar pneumonia, unspecified organism: Secondary | ICD-10-CM

## 2021-03-26 DIAGNOSIS — G4733 Obstructive sleep apnea (adult) (pediatric): Secondary | ICD-10-CM

## 2021-03-26 DIAGNOSIS — M351 Other overlap syndromes: Secondary | ICD-10-CM

## 2021-03-26 DIAGNOSIS — E042 Nontoxic multinodular goiter: Secondary | ICD-10-CM

## 2021-03-26 DIAGNOSIS — D6861 Antiphospholipid syndrome: Secondary | ICD-10-CM

## 2021-03-26 DIAGNOSIS — R918 Other nonspecific abnormal finding of lung field: Secondary | ICD-10-CM

## 2021-03-26 DIAGNOSIS — I82401 Acute embolism and thrombosis of unspecified deep veins of right lower extremity: Secondary | ICD-10-CM

## 2021-03-26 DIAGNOSIS — J189 Pneumonia, unspecified organism: Secondary | ICD-10-CM

## 2021-03-26 DIAGNOSIS — N182 Chronic kidney disease, stage 2 (mild): Secondary | ICD-10-CM

## 2021-03-26 DIAGNOSIS — E782 Mixed hyperlipidemia: Secondary | ICD-10-CM

## 2021-03-26 DIAGNOSIS — Z9989 Dependence on other enabling machines and devices: Secondary | ICD-10-CM

## 2021-03-26 LAB — POCT INR: INR: 1.7 — AB (ref 2.0–3.0)

## 2021-03-26 MED ORDER — HYDROXYCHLOROQUINE SULFATE 200 MG PO TABS: 200.0000 mg | ORAL_TABLET | Freq: Two times a day (BID) | ORAL | 0 refills | Status: DC

## 2021-03-26 MED ORDER — MYCOPHENOLATE MOFETIL 500 MG PO TABS: 1000.0000 mg | ORAL_TABLET | Freq: Two times a day (BID) | ORAL | 6 refills | Status: DC

## 2021-03-26 NOTE — Telephone Encounter (Signed)
Ok to send in prescription for 1g mycophenolate to be taken twice daily.   Thanks, Wille Glaser

## 2021-03-26 NOTE — Telephone Encounter (Signed)
Called and spoke to pt. Pt states he was just seen by Dr. [name] today and she states it is ok to increase the dose of Cellcept. It is not mentioned in Dr. August Albino note from 4/26. Pt is currently at 500mg  BID. It is mentioned in Dr. Arlean Hopping note,  "I am in agreement to increase the dose of CellCept to 1 g p.o. twice daily."  Pt states he is out of Cellcept as of today and is requesting a response today regarding the dose.   Dr. Erin Fulling, please advise. Thanks.

## 2021-03-26 NOTE — Progress Notes (Signed)
Pharmacy Note  Subjective: Patient presents today to [facility] for follow up office visit.   Patient seen by the pharmacist for counseling on hydroxychloroquine for antiphospholipid syndrome.   He is currently taking warfarin (managed by warfarin clinic), mycophenolate 500mg  twice daily, prednisone 40mg  daily (with plan to taper), SMP/TMX SS daily for PJP prophylaxis.  Recent diagnosis of ILD - was previously seen by Dr. Annamaria Boots and is now seen by Dr. Erin Fulling.  Objective: CMP     Component Value Date/Time   NA 139 03/23/2021 0258   NA 136 10/14/2020 0823   K 4.7 03/23/2021 0258   CL 105 03/23/2021 0258   CO2 24 03/23/2021 0258   GLUCOSE 107 (H) 03/23/2021 0258   BUN 16 03/23/2021 0258   BUN 19 10/14/2020 0823   CREATININE 1.24 03/23/2021 0258   CREATININE 1.33 (H) 10/21/2020 0811   CALCIUM 9.2 03/23/2021 0258   PROT 8.4 (H) 03/20/2021 1210   PROT 7.8 10/14/2020 0823   ALBUMIN 4.5 03/20/2021 1210   ALBUMIN 4.4 10/14/2020 0823   AST 27 03/20/2021 1210   AST 28 10/21/2020 0811   ALT 42 03/20/2021 1210   ALT 63 (H) 10/21/2020 0811   ALKPHOS 48 03/20/2021 1210   BILITOT 0.9 03/20/2021 1210   BILITOT 0.7 10/21/2020 0811   GFRNONAA >60 03/23/2021 0258   GFRNONAA >60 10/21/2020 0811   GFRAA 84 10/14/2020 0823   GFRAA >60 07/15/2020 0801    CBC    Component Value Date/Time   WBC 15.8 (H) 03/24/2021 0303   RBC 5.13 03/24/2021 0303   HGB 15.5 03/24/2021 0303   HGB 15.0 10/21/2020 0811   HCT 47.5 03/24/2021 0303   PLT 267 03/24/2021 0303   PLT 209 10/21/2020 0811   MCV 92.6 03/24/2021 0303   MCH 30.2 03/24/2021 0303   MCHC 32.6 03/24/2021 0303   RDW 13.3 03/24/2021 0303   LYMPHSABS 0.6 (L) 03/20/2021 1210   MONOABS 0.2 03/20/2021 1210   EOSABS 0.0 03/20/2021 1210   BASOSABS 0.0 03/20/2021 1210   Assessment/Plan: Patient was counseled on the purpose, proper use, and adverse effects of hydroxychloroquine including nausea/diarrhea, skin rash, headaches, and sun  sensitivity. He wears sunscreen regularly and diligently when outside.  Discussed importance of annual eye exams while on hydroxychloroquine to monitor to ocular toxicity and discussed importance of frequent laboratory monitoring.  Provided patient with eye exam form for baseline ophthalmologic exam and standing lab instructions.  He will have labs drawn in [place] at [facility].  He has ophthamology upcoming appt and has been advised to ensure that they are able to perform OCT exam for Plaquenil. Provided patient with educational materials on hydroxychloroquine and answered all questions.  Patient consented to hydroxychloroquine.  Will upload consent in the media tab.   Dose will be Plaquenil 200 mg twice daily.  He has been advised to notify warfarin clinic of addition of hydroxchloroquine.  Prescription pending lab results.  Knox Saliva, PharmD, MPH Clinical Pharmacist (Rheumatology and Pulmonology)

## 2021-03-26 NOTE — Patient Instructions (Addendum)
Hydroxychloroquine dose 200mg  twice daily (with breakfast and dinner)  Standing Labs We placed an order today for your standing lab work.   Please have your standing labs drawn in 1 month, 2 months, then every 5 months  If possible, please have your labs drawn 2 weeks prior to your appointment so that the provider can discuss your results at your appointment.  We have open lab daily Monday through Thursday from 1:30-4:30 PM and Friday from 1:30-4:00 PM at the office of Dr. Bo Merino, [facility].   Please be advised, all patients with office appointments requiring lab work will take precedents over walk-in lab work.  If possible, please come for your lab work on Monday and Friday afternoons, as you may experience shorter wait times. The office is located at [address], [place], [place], [place] [address] No appointment is necessary.   Labs are drawn by [facility]. Please bring your co-pay at the time of your lab draw.  You may receive a bill from [facility] for your lab work.  If you wish to have your labs drawn at another location, please call the office 24 hours in advance to send orders.  If you have any questions regarding directions or hours of operation,  please call [phone].   As a reminder, please drink plenty of water prior to coming for your lab work. Thanks!   Heart Disease Prevention   Your inflammatory disease increases your risk of heart disease which includes heart attack, stroke, atrial fibrillation (irregular heartbeats), high blood pressure, heart failure and atherosclerosis (plaque in the arteries).  It is important to reduce your risk by:   . Keep blood pressure, cholesterol, and blood sugar at healthy levels   . Smoking Cessation   . Maintain a healthy weight  o BMI 20-25   . Eat a healthy diet  o Plenty of fresh fruit, vegetables, and whole grains  o Limit saturated fats, foods high in sodium, and added sugars  o DASH and Mediterranean  diet   . Increase physical activity  o Recommend moderate physically activity for 150 minutes per week/ 30 minutes a day for five days a week These can be broken up into three separate ten-minute sessions during the day.   . Reduce Stress  . Meditation, slow breathing exercises, yoga, coloring books  . Dental visits twice a year    Hydroxychloroquine tablets What is this medicine? HYDROXYCHLOROQUINE (hye drox ee KLOR oh kwin) is used to treat rheumatoid arthritis and systemic lupus erythematosus. It is also used to treat malaria. This medicine may be used for other purposes; ask your health care provider or pharmacist if you have questions. COMMON BRAND NAME(S): Plaquenil, Quineprox What should I tell my health care provider before I take this medicine? They need to know if you have any of these conditions:  diabetes  eye disease, vision problems  G6PD deficiency  heart disease  history of irregular heartbeat  if you often drink alcohol  kidney disease  liver disease  porphyria  psoriasis  an unusual or allergic reaction to chloroquine, hydroxychloroquine, other medicines, foods, dyes, or preservatives  pregnant or trying to get pregnant  breast-feeding How should I use this medicine? Take this medicine by mouth with a glass of water. Take it as directed on the prescription label. Do not cut, crush or chew this medicine. Swallow the tablets whole. Take it with food. Do not take it more than directed. Take all of this medicine unless your health  care provider tells you to stop it early. Keep taking it even if you think you are better. Take products with antacids in them at a different time of day than this medicine. Take this medicine 4 hours before or 4 hours after antacids. Talk to your health care provider if you have questions. Talk to your pediatrician regarding the use of this medicine in children. While this drug may be prescribed for selected conditions,  precautions do apply. Overdosage: If you think you have taken too much of this medicine contact a poison control center or emergency room at once. NOTE: This medicine is only for you. Do not share this medicine with others. What if I miss a dose? If you miss a dose, take it as soon as you can. If it is almost time for your next dose, take only that dose. Do not take double or extra doses. What may interact with this medicine? Do not take this medicine with any of the following medications:  cisapride  dronedarone  pimozide  thioridazine This medicine may also interact with the following medications:  ampicillin  antacids  cimetidine  cyclosporine  digoxin  kaolin  medicines for diabetes, like insulin, glipizide, glyburide  medicines for seizures like carbamazepine, phenobarbital, phenytoin  mefloquine  methotrexate  other medicines that prolong the QT interval (cause an abnormal heart rhythm)  praziquantel This list may not describe all possible interactions. Give your health care provider a list of all the medicines, herbs, non-prescription drugs, or dietary supplements you use. Also tell them if you smoke, drink alcohol, or use illegal drugs. Some items may interact with your medicine. What should I watch for while using this medicine? Visit your health care provider for regular checks on your progress. Tell your health care provider if your symptoms do not start to get better or if they get worse. You may need blood work done while you are taking this medicine. If you take other medicines that can affect heart rhythm, you may need more testing. Talk to your health care provider if you have questions. Your vision may be tested before and during use of this medicine. Tell your health care provider right away if you have any change in your eyesight. This medicine may cause serious skin reactions. They can happen weeks to months after starting the medicine. Contact your  health care provider right away if you notice fevers or flu-like symptoms with a rash. The rash may be red or purple and then turn into blisters or peeling of the skin. Or, you might notice a red rash with swelling of the face, lips or lymph nodes in your neck or under your arms. If you or your family notice any changes in your behavior, such as new or worsening depression, thoughts of harming yourself, anxiety, or other unusual or disturbing thoughts, or memory loss, call your health care provider right away. What side effects may I notice from receiving this medicine? Side effects that you should report to your doctor or health care professional as soon as possible:  allergic reactions (skin rash, itching or hives; swelling of the face, lips, or tongue)  changes in vision  decreased hearing, ringing in the ears  heartbeat rhythm changes (trouble breathing; chest pain; dizziness; fast, irregular heartbeat; feeling faint or lightheaded, falls)  liver injury (dark yellow or brown urine; general ill feeling or flu-like symptoms; loss of appetite, right upper belly pain; unusually weak or tired, yellowing of the eyes or skin)  low blood  sugar (feeling anxious; confusion; dizziness; increased hunger; unusually weak or tired; increased sweating; shakiness; cold, clammy skin; irritable; headache; blurred vision; fast heartbeat; loss of consciousness)  low red blood cell counts (trouble breathing; feeling faint; lightheaded, falls; unusually weak or tired)  muscle weakness  pain, tingling, numbness in the hands or feet  rash, fever, and swollen lymph nodes  redness, blistering, peeling or loosening of the skin, including inside the mouth  suicidal thoughts, mood changes  uncontrollable head, mouth, neck, arm, or leg movements  unusual bruising or bleeding Side effects that usually do not require medical attention (report to your doctor or health care professional if they continue or are  bothersome):  diarrhea  hair loss  irritable This list may not describe all possible side effects. Call your doctor for medical advice about side effects. You may report side effects to FDA at 1-800-FDA-1088. Where should I keep my medicine? Keep out of the reach of children and pets. Store at room temperature up to 30 degrees C (86 degrees F). Protect from light. Get rid of any unused medicine after the expiration date. To get rid of medicines that are no longer needed or have expired:  Take the medicine to a medicine take-back program. Check with your pharmacy or law enforcement to find a location.  If you cannot return the medicine, check the label or package insert to see if the medicine should be thrown out in the garbage or flushed down the toilet. If you are not sure, ask your health care provider. If it is safe to put it in the trash, empty the medicine out of the container. Mix the medicine with cat litter, dirt, coffee grounds, or other unwanted substance. Seal the mixture in a bag or container. Put it in the trash. NOTE: This sheet is a summary. It may not cover all possible information. If you have questions about this medicine, talk to your doctor, pharmacist, or health care provider.  2021 Elsevier/Gold Standard (2020-05-06 15:07:49)

## 2021-03-26 NOTE — Patient Instructions (Addendum)
Pre visit review using our clinic review tool, if applicable. No additional management support is needed unless otherwise documented below in the visit note.  A full discussion of the nature of anticoagulants has been carried out.  A benefit risk analysis has been presented to the patient, so that they understand the justification for choosing anticoagulation at this time. The need for frequent and regular monitoring, precise dosage adjustment and compliance is stressed.  Side effects of potential bleeding are discussed.  The patient should avoid any OTC items containing aspirin or ibuprofen, and should avoid great swings in general diet.  Avoid alcohol consumption.     Take 2 1/2 tablets (12.5) today, tomorrow and Friday and take 1 1/2 tablets (7.5 mg) on Saturday and Sunday.  Re-check on Monday.  Continue to take Lovenox. *Patient is taking 30 mg of prednisone for 30 days. *Patient is also starting Bactrim for 2 to 3 months.

## 2021-03-26 NOTE — Telephone Encounter (Signed)
Rx sent to preferred pharmacy. Called and left detailed message for pt regarding script. Pt was aware previously about the dose change and a new script being sent to pharmacy. Will leave open in case pt calls back. Can close after one more attempt to reach him.

## 2021-03-27 ENCOUNTER — Other Ambulatory Visit (HOSPITAL_COMMUNITY): Payer: Self-pay

## 2021-03-27 ENCOUNTER — Ambulatory Visit: Payer: BC Managed Care – PPO | Attending: Internal Medicine

## 2021-03-27 ENCOUNTER — Other Ambulatory Visit: Payer: Self-pay

## 2021-03-27 DIAGNOSIS — R41841 Cognitive communication deficit: Secondary | ICD-10-CM | POA: Insufficient documentation

## 2021-03-27 DIAGNOSIS — R4701 Aphasia: Secondary | ICD-10-CM | POA: Insufficient documentation

## 2021-03-27 DIAGNOSIS — G4733 Obstructive sleep apnea (adult) (pediatric): Secondary | ICD-10-CM | POA: Diagnosis not present

## 2021-03-27 NOTE — Therapy (Signed)
[facility] [address] [place], [place], [address] Phone: [phone]   Fax:  [phone]  Speech Language Pathology Evaluation  Patient Details  Name: Wesley Hancock MRN: [id number] Date of Birth: 1971-08-05 Referring Provider (SLP): Arrien, Jimmy Picket, MD (Doc/PCP- Inda Coke)   Encounter Date: 03/27/2021   End of Session - 03/27/21 1403    Visit Number 1    Number of Visits 17    Date for SLP Re-Evaluation 06/25/21    Authorization Type BCBS    SLP Start Time 6433    SLP Stop Time  1400    SLP Time Calculation (min) 45 min    Activity Tolerance Patient tolerated treatment well           Past Medical History:  Diagnosis Date  . Acute nonintractable headache 09/06/2019  . Acute respiratory failure with hypoxia ([place]) 08/30/2019  . AKI (acute kidney injury) ([place]) 08/30/2019  . Antiphospholipid syndrome ([place]) 10/30/2019  . Anxiety   . APS (antiphospholipid syndrome) ([place])   . Arthritis   . Colon polyp    ? hyperplastic  . Depression   . Diarrhea 05/21/2020  . DVT (deep venous thrombosis) ([place])   . Dyspnea on exertion 09/08/2017   "Quit smoking" 2015 with onset of symptoms in 2016  Spirometry 09/08/2017  Flat f/v loop  - d/c acei 09/08/2017  - 01/04/2020   Walked RA x two laps =  approx 540ft @ fast pace - stopped due to end of study/ min sob with sats of 93 % at the end of the study. - PFT's  03/08/20   FEV1 3.84 (83 % ) ratio 0..90  p 3 % improvement from saba p ? prior to study with DLCO  26.46 (77%) corrects to 4.76 (99%)    . Esophageal spasm   . Essential hypertension 09/23/2014   D/c acei 09/08/2017 due to pseudocopd   . GAD (generalized anxiety disorder) 08/06/2016  . Lobar pneumonia ([place]) 08/30/2019  . Lung nodule 09/21/2014  . Lupus ([place])   . Mixed hyperlipidemia 09/18/2015  . Morbid obesity due to excess calories ([place]) c/b hbp/ dvt/PE 09/10/2019  . 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 Nutcracker esophagus   . Paresthesia of right foot 11/08/2019  . Pulmonary infiltrates 09/10/2019   In setting of bilateral PE 08/23/2019 with antiphospholipid syndrome   . Recurrent pneumonia 07/09/2020   Formatting of this note might be different from the original. 06/26/20, 03/08/20  . Snoring 07/09/2020  . Upper airway cough syndrome 01/05/2020   Onset mid Jan 2021 while on otc PPI  - max rx for gerd 01/04/2020 >>>     . Witnessed apneic spells 07/09/2020    Past Surgical History:  Procedure Laterality Date  . BRONCHIAL WASHINGS  01/14/2021   Procedure: BRONCHIAL WASHINGS;  Surgeon: Freddi Starr, MD;  Location: [facility];  Service: Pulmonary;;  . CHOLECYSTECTOMY    . VIDEO BRONCHOSCOPY N/A 01/14/2021   Procedure: VIDEO BRONCHOSCOPY WITHOUT FLUORO;  Surgeon: Freddi Starr, MD;  Location: [facility];  Service: Pulmonary;  Laterality: N/A;    There were no vitals filed for this visit.       SLP Evaluation [facility] - 03/27/21 1306      SLP Visit Information   SLP  Received On 03/21/21    Referring Provider (SLP) Arrien, Jimmy Picket, MD   Doc/PCP- Inda Coke   Onset Date 03-20-21    Medical Diagnosis Acute CVA      Subjective   Patient/Family Stated Goal "I want to get back to the way I was"      General Information   HPI 50 y.o. male with a PMHx of antiphospholipid syndrome, DVT, CKD, HTN, lupus, HLD, obesity, untreated OSA (waiting to be started on home CPAP) and PE (on chronic Lovenox) who presented to the ED with expressive dysphasia. MRI brain revealed an acute left frontal operculum ischemic infarction.      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"     Cognition   Overall Cognitive Status Within Functional Limits for tasks assessed      Auditory Comprehension   Overall Auditory Comprehension Impaired    Yes/No Questions Impaired    Complex Questions 75-100% accurate    Commands Within Functional Limits    Other Conversation Comments Pt exhibits fast rate of processing which impacts his comprehension    Interfering Components Processing speed    EffectiveTechniques Extra processing time;Repetition      Reading Comprehension   Reading Status Impaired    Paragraph Level 76-100% accurate      Verbal Expression   Overall Verbal Expression Impaired    Initiation No impairment    Level of Generative/Spontaneous Verbalization Conversation    Repetition No impairment    Naming Impairment    Confrontation 75-100% accurate    Convergent Not tested    Divergent Not tested    Verbal Errors Semantic paraphasias    Pragmatics No impairment    Interfering Components Attention      Written Expression   Dominant Hand Right    Written Expression Not tested      Oral Motor/Sensory Function   Overall Oral Motor/Sensory Function Appears within functional limits for tasks assessed      Motor Speech   Overall Motor Speech Appears within functional limits for tasks assessed      Standardized Assessments   Standardized Assessments  Other Assessment   Quick Aphasia Battery                          SLP Education - 03/27/21 1403    Education Details eval results, possible goals, word finding strategies, functional practice    Person(s) Educated Patient    Methods Explanation;Demonstration;Handout    Comprehension Verbalized understanding;Returned demonstration;Need further instruction            SLP Short Term Goals - 03/27/21 1403      SLP SHORT TERM GOAL  #1   Title Pt will complete HEP with rare min A over 3 sessions    Time 4    Period Weeks    Status New      SLP SHORT TERM GOAL #2   Title Pt will utilize speech compensations in 15 minute mod complex conversations with rare min A over 2 sessions    Time 4    Period Weeks  Status New      SLP SHORT TERM GOAL #3   Title Pt will demo awareness of need to reduce speech/processing speed for 4/5 opportunities in conversation with occasional min A over 2 sessions    Time 4    Period Weeks    Status New      SLP SHORT TERM GOAL #4   Title Pt will answer (yes/no/WH-) questions regarding paragraph length information with 80% accuracy given rare min A for comprehension over 2 sessions    Time 4    Period Weeks    Status New            SLP Long Term Goals - 03/27/21 1414      SLP LONG TERM GOAL #1   Title Pt will utilize speech compensations in 30 minute mod complex to complex conversation with rare min A over 2 sessions    Time 8    Period Weeks    Status New      SLP LONG TERM GOAL #2   Title Pt will demo awareness of need to reduce speech/processing speed for 8/10 opportunities in conversation with rare min A over 2 sessions    Time 8    Period Weeks    Status New      SLP LONG TERM GOAL #3   Title Pt will comprehend mod complex written communication related to medical information or topics of personal interst with rare min A over 2 sessions    Time 8    Period Weeks    Status New      SLP LONG TERM GOAL #4   Title Pt will report less frustration and improved communication effectiveness via QOL by last ST session    Time 8    Period Weeks    Status New            Plan - 03/27/21 1407    Clinical Impression Statement Wesley Hancock presents for OPST evaluation to assess aphasia s/p acute CVA on 03-20-21. Pt presents with mild expressive and receptive language deficits, with pt indicating usual word finding in conversation and reduced comprehension while reading. Pt  verbalized awareness that he needs to "slown down" as he gets "toor far ahead" of himself. Frustration reported related to new onset anomia and reduced communication effectiveness with family and friends. Pt's wife reportedly needs to cue him to be aware of dysnomic errors on occasion and makes patient request for a hint if word finding occurs and additional processing time is unsuccessful. 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 He is concerned his customers will be aware of his anomia while conversing. Some paraphasias noted during paragraph level reading tasks, with anomic episodes x2 in verbal summary of passage. Some difficulty spelling also indicated during evaluation, which was not tested due to time constraints. Pt declined any changes in swallow function following stroke. Possible change in cognition indicated, which may need to be evaluated at later date. Skilled ST is warranted to address mild expressive and receptive aphasia to improve communication effectiveness with all conversational partners and to improve QOL.    Speech Therapy Frequency 2x / week    Duration 8 weeks   or 17 total visits   Treatment/Interventions Functional tasks;Cueing hierarchy;Patient/family education;Multimodal communcation approach;Language facilitation;Compensatory techniques;Internal/external aids;SLP instruction and feedback;Cognitive reorganization    Potential to Achieve Goals Good    SLP Home Exercise Plan provided    Consulted and Agree  with Plan of Care Patient           Patient will benefit from skilled therapeutic intervention in order to improve the following deficits and impairments:   Aphasia  Cognitive communication deficit    Problem List Patient Active Problem List   Diagnosis Date Noted  . Long term (current) use of anticoagulants 03/26/2021  . CVA (cerebral vascular accident) ([place]) 03/20/2021  . Stroke ([place])  03/20/2021  . Mixed connective tissue disease ([place]) 01/21/2021  . Multinodular goiter 01/10/2021  . OSA on CPAP 10/11/2020  . Esophageal spasm   . Colon polyp   . Arthritis   . APS (antiphospholipid syndrome) ([facility])   . Anxiety   . CKD (chronic kidney disease) stage 2, GFR 60-89 ml/min 07/11/2020  . Recurrent pneumonia 07/09/2020  . Snoring 07/09/2020  . Witnessed apneic spells 07/09/2020  . Pneumonia 06/26/2020  . Diarrhea 05/21/2020  . Paresthesia of right foot 11/08/2019  . Pulmonary infiltrates 09/10/2019  . Morbid obesity due to excess calories ([place]) c/b hbp/ dvt/PE 09/10/2019  . Acute nonintractable headache 09/06/2019  . Lobar pneumonia ([place]) 08/30/2019  . Acute respiratory failure with hypoxia ([place]) 08/30/2019  . AKI (acute kidney injury) ([place]) 08/30/2019  . DVT (deep venous thrombosis) ([place]) 08/30/2019  . Multiple pulmonary emboli ([place]) 08/22/2019  . Dyspnea on exertion 09/08/2017  . Depression 06/24/2017  . GAD (generalized anxiety disorder) 08/06/2016  . Mixed hyperlipidemia 09/18/2015  . Hypertensive heart disease 09/23/2014  . Nutcracker esophagus 09/21/2014  . Lung nodule 09/21/2014    Alinda Deem, MA CCC-SLP 03/28/2021, 9:06 AM  [facility] [address] [place], [place], [address] Phone: [phone]   Fax:  [phone]  Name: Wesley Hancock MRN: [id number] Date of Birth: 14-Aug-1971

## 2021-03-27 NOTE — Patient Instructions (Signed)
Aphasia Therapy: How to Practice at [facility] . Go around the rooms in your home and name each object you see.  . Go outside and name what you see. Marland Kitchen Open drawers, cabinets, closets and name the items you see. . Look around and name items that begin with a certain letter. . Look around and name items that are a certain color.   PHRASE/SENTENCE LEVEL . Use a carrier phrase as you name items around your house/apartment, such as: "This is my _____", "I have _____", "Where is my _____".  . Describe what you are doing during your daily routine or chores.  o Bathroom: Brushing teeth, washing face o Dressing: Items of clothing you want to wear o Kitchen: Name the item you are putting away from the sink or dishwasher. Say the steps for making food.  o Laundry: Name the item you are folding/putting away.  . Look through pictures and describe who, what, where, when, and why for each picture.   READING . Reading out loud is a good way to practice your speech. You can read: . Newspapers, magazines, recipes, letters, bills, text messages/emails   WRITING . Write the names of items around your house, to-do lists, grocery lists, medication logs, or daily activity logs. . Write down your family member's names and organize it into a family tree . Write down common personal phrases you usually say.  . Write a summary of a news story or short video you watch online or on TV.

## 2021-03-27 NOTE — Telephone Encounter (Signed)
Called and left detailed message for pt regarding dosage change in [facility]. Pt was already aware of the potential change prior to the script being sent. Advised pt to call back with any questions or concerns. Nothing further needed at this time.

## 2021-03-28 LAB — IGG, IGA, IGM
IgG (Immunoglobin G), Serum: 1016 mg/dL (ref 600–1640)
IgM, Serum: 165 mg/dL (ref 50–300)
Immunoglobulin A: 369 mg/dL — ABNORMAL HIGH (ref 47–310)

## 2021-03-28 LAB — PROTEIN ELECTROPHORESIS, SERUM, WITH REFLEX
Albumin ELP: 4.1 g/dL (ref 3.8–4.8)
Alpha 1: 0.4 g/dL — ABNORMAL HIGH (ref 0.2–0.3)
Alpha 2: 0.8 g/dL (ref 0.5–0.9)
Beta 2: 0.4 g/dL (ref 0.2–0.5)
Beta Globulin: 0.6 g/dL (ref 0.4–0.6)
Gamma Globulin: 1.1 g/dL (ref 0.8–1.7)
Total Protein: 7.3 g/dL (ref 6.1–8.1)

## 2021-03-28 LAB — TSH: TSH: 1.5 mIU/L (ref 0.40–4.50)

## 2021-03-28 LAB — GLUCOSE 6 PHOSPHATE DEHYDROGENASE: G-6PDH: 19 U/g Hgb (ref 7.0–20.5)

## 2021-03-28 NOTE — Progress Notes (Signed)
All the labs are within normal limits.

## 2021-03-31 ENCOUNTER — Ambulatory Visit: Payer: BC Managed Care – PPO

## 2021-03-31 ENCOUNTER — Other Ambulatory Visit (INDEPENDENT_AMBULATORY_CARE_PROVIDER_SITE_OTHER): Payer: BC Managed Care – PPO

## 2021-03-31 ENCOUNTER — Ambulatory Visit (INDEPENDENT_AMBULATORY_CARE_PROVIDER_SITE_OTHER): Payer: BC Managed Care – PPO | Admitting: General Practice

## 2021-03-31 ENCOUNTER — Other Ambulatory Visit: Payer: Self-pay

## 2021-03-31 DIAGNOSIS — I82622 Acute embolism and thrombosis of deep veins of left upper extremity: Secondary | ICD-10-CM

## 2021-03-31 DIAGNOSIS — Z7901 Long term (current) use of anticoagulants: Secondary | ICD-10-CM | POA: Diagnosis not present

## 2021-03-31 LAB — POCT INR: INR: 2.8 (ref 2.0–3.0)

## 2021-03-31 NOTE — Patient Instructions (Signed)
Pre visit review using our clinic review tool, if applicable. No additional management support is needed unless otherwise documented below in the visit note.  Take 2 tablets daily except take 2 1/2 tablets on Mondays and Fridays.  Re-check in 1 week.  Stop Lovenox.   *Patient is taking 30 mg of prednisone for 30 days. *Patient is also starting Bactrim for 2 to 3 months.

## 2021-04-01 ENCOUNTER — Ambulatory Visit: Payer: BC Managed Care – PPO | Attending: Internal Medicine

## 2021-04-01 DIAGNOSIS — R41841 Cognitive communication deficit: Secondary | ICD-10-CM | POA: Diagnosis not present

## 2021-04-01 DIAGNOSIS — R4701 Aphasia: Secondary | ICD-10-CM

## 2021-04-01 NOTE — Therapy (Signed)
[facility] [address] [place], [place], [address] Phone: [phone]   Fax:  [phone]  Speech Language Pathology Treatment  Patient Details  Name: Wesley Hancock MRN: [id number] Date of Birth: [birth year] Referring Provider (SLP): Hancock, Wesley Picket, MD (Doc/PCP- Wesley Hancock)   Encounter Date: 04/01/2021   End of Session - 04/01/21 2338    Visit Number 2    Number of Visits 17    Date for SLP Re-Evaluation 06/25/21    Authorization Type BC[employer]    SLP Start Time 4193    SLP Stop Time  7902    SLP Time Calculation (min) 41 min    Activity Tolerance Patient tolerated treatment well           Past Medical History:  Diagnosis Date  . Acute nonintractable headache 09/06/2019  . Acute respiratory failure with hypoxia ([place]) 08/30/2019  . AKI (acute kidney injury) ([place]) 08/30/2019  . Antiphospholipid syndrome ([place]) 10/30/2019  . Anxiety   . APS (antiphospholipid syndrome) ([place])   . Arthritis   . Colon polyp    ? hyperplastic  . Depression   . Diarrhea 05/21/2020  . DVT (deep venous thrombosis) ([place])   . Dyspnea on exertion 09/08/2017   "Quit smoking" 2015 with onset of symptoms in 2016  Spirometry 09/08/2017  Flat f/v loop  - d/c acei 09/08/2017  - 01/04/2020   Walked RA x two laps =  approx 537ft @ fast pace - stopped due to end of study/ min sob with sats of 93 % at the end of the study. - PFT's  03/08/20   FEV1 3.84 (83 % ) ratio 0..90  p 3 % improvement from saba p ? prior to study with DLCO  26.46 (77%) corrects to 4.76 (99%)    . Esophageal spasm   . Essential hypertension 09/23/2014   D/c acei 09/08/2017 due to pseudocopd   . GAD (generalized anxiety disorder) 08/06/2016  . Lobar pneumonia ([place]) 08/30/2019  . Lung nodule 09/21/2014  . Lupus ([place])   . Mixed hyperlipidemia 09/18/2015  . Morbid obesity due to excess calories ([place]) c/b hbp/ dvt/PE 09/10/2019  . 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 Nutcracker esophagus   . Paresthesia of right foot 11/08/2019  . Pulmonary infiltrates 09/10/2019   In setting of bilateral PE 08/23/2019 with antiphospholipid syndrome   . Recurrent pneumonia 07/09/2020   Formatting of this note might be different from the original. 06/26/20, 03/08/20  . Snoring 07/09/2020  . Upper airway cough syndrome 01/05/2020   Onset mid Jan 2021 while on otc PPI  - max rx for gerd 01/04/2020 >>>     . Witnessed apneic spells 07/09/2020    Past Surgical History:  Procedure Laterality Date  . BRONCHIAL WASHINGS  01/14/2021   Procedure: BRONCHIAL WASHINGS;  Surgeon: Wesley Starr, MD;  Location: [facility];  Service: Pulmonary;;  . CHOLECYSTECTOMY    . VIDEO BRONCHOSCOPY N/A 01/14/2021   Procedure: VIDEO BRONCHOSCOPY WITHOUT FLUORO;  Surgeon: Wesley Starr, MD;  Location: [facility];  Service: Pulmonary;  Laterality: N/A;    There were no vitals filed for this visit.   Subjective Assessment - 04/01/21 1409    Subjective "Main thing I have trouble with now is reading comprehension."  Currently in Pain? No/denies                 ADULT SLP TREATMENT - 04/01/21 1409      General Information   Behavior/Cognition Alert;Pleasant mood;Cooperative      Cognitive-Linquistic Treatment   Treatment focused on Aphasia    Skilled Treatment Pt is unsure if his difficulty with reading is accuracy reading out loud or with reading comprehension. SLP assessed pt's reading to ascertain if reading comprehension or verbal/aphasic errors when reading out loud - deficit was with reading comprehension as evidenced with subtests of Reading Comprehension Battery of Aphasia. SLP then copied article about Wesley Hancock draft last weekend and pt req'd to look back at article after reading it to himself  30% of the time, however all questions ansewred 100% success with extra time. [name] stated to SLP that he needed a longer time to read the stimuli on the assessment than he would have req'd prior to the CVA. SLP suggested pt do reading comprehension tasks at home and he told SLP he would read less interesting articles with wife an dwife would ask him questions - SLP affirmed this plan.      Assessment / Recommendations / Plan   Plan Continue with current plan of care      Progression Toward Goals   Progression toward goals Progressing toward goals            SLP Education - 04/01/21 2338    Education Details complete reading comprehension tasks with wife    Person(s) Educated Patient    Methods Explanation    Comprehension Verbalized understanding            SLP Short Term Goals - 04/01/21 2341      SLP SHORT TERM GOAL #1   Title Pt will complete HEP with rare min A over 3 sessions    Time 4    Period Weeks    Status On-going      SLP SHORT TERM GOAL #2   Title Pt will utilize speech compensations in 15 minute mod complex conversations with rare min A over 2 sessions    Time 4    Period Weeks    Status On-going      SLP SHORT TERM GOAL #3   Title Pt will demo awareness of need to reduce speech/processing speed for 4/5 opportunities in conversation with occasional min A over 2 sessions    Baseline 04-01-21    Time 4    Period Weeks    Status On-going      SLP SHORT TERM GOAL #4   Title Pt will answer (yes/no/WH-) questions regarding paragraph length information with 80% accuracy given rare min A for comprehension over 2 sessions    Baseline 04-01-21    Time 4    Period Weeks    Status On-going            SLP Long Term Goals - 04/01/21 2342      SLP LONG TERM GOAL #1   Title Pt will utilize speech compensations in 30 minute mod complex to complex conversation with rare min A over 2 sessions    Time 8    Period Weeks    Status On-going      SLP LONG TERM GOAL #2    Title Pt will demo awareness of need to reduce speech/processing speed for 8/10 opportunities in conversation with rare min A over 2 sessions    Time 8  Period Weeks    Status On-going      SLP LONG TERM GOAL #3   Title Pt will comprehend mod complex written communication related to medical information or topics of personal interst with rare min A over 2 sessions    Time 8    Period Weeks    Status On-going      SLP LONG TERM GOAL #4   Title Pt will report less frustration and improved communication effectiveness via QOL by last ST session    Time 8    Period Weeks    Status On-going            Plan - 04/01/21 2339    Clinical Impression Statement Racer Quam") presents with mostly functional/normal expressive and receptive language today but dmeonstrated reading comprehension deficits with RCBA. Pt utilized a slower rate from his habitual rate (he stated), telling SLP he realizes that he needs to "slown down" as he gets "toor far ahead" of himself. No paraphasias noted during conversation today about mod complex topics. Possible change in cognition indicated, which may need to be evaluated at later date. Skilled ST is warranted to address mild expressive and receptive aphasia to improve communication effectiveness with all conversational partners and to improve QOL.    Speech Therapy Frequency 2x / week    Duration 8 weeks   or 17 total visits   Treatment/Interventions Functional tasks;Cueing hierarchy;Patient/family education;Multimodal communcation approach;Language facilitation;Compensatory techniques;Internal/external aids;SLP instruction and feedback;Cognitive reorganization    Potential to Achieve Goals Good    SLP Home Exercise Plan provided    Consulted and Agree with Plan of Care Patient           Patient will benefit from skilled therapeutic intervention in order to improve the following deficits and impairments:   Aphasia  Cognitive communication  deficit    Problem List Patient Active Problem List   Diagnosis Date Noted  . Long term (current) use of anticoagulants 03/26/2021  . CVA (cerebral vascular accident) ([place]) 03/20/2021  . Stroke ([place]) 03/20/2021  . Mixed connective tissue disease ([place]) 01/21/2021  . Multinodular goiter 01/10/2021  . OSA on CPAP 10/11/2020  . Esophageal spasm   . Colon polyp   . Arthritis   . APS (antiphospholipid syndrome) ([place])   . Anxiety   . CKD (chronic kidney disease) stage 2, GFR 60-89 ml/min 07/11/2020  . Recurrent pneumonia 07/09/2020  . Snoring 07/09/2020  . Witnessed apneic spells 07/09/2020  . Pneumonia 06/26/2020  . Diarrhea 05/21/2020  . Paresthesia of right foot 11/08/2019  . Pulmonary infiltrates 09/10/2019  . Morbid obesity due to excess calories ([place]) c/b hbp/ dvt/PE 09/10/2019  . Acute nonintractable headache 09/06/2019  . Lobar pneumonia ([place]) 08/30/2019  . Acute respiratory failure with hypoxia ([place]) 08/30/2019  . AKI (acute kidney injury) ([place]) 08/30/2019  . DVT (deep venous thrombosis) ([place]) 08/30/2019  . Multiple pulmonary emboli ([place]) 08/22/2019  . Dyspnea on exertion 09/08/2017  . Depression 06/24/2017  . GAD (generalized anxiety disorder) 08/06/2016  . Mixed hyperlipidemia 09/18/2015  . Hypertensive heart disease 09/23/2014  . Nutcracker esophagus 09/21/2014  . Lung nodule 09/21/2014    [facility] ,Rebecca, CCC-SLP  04/01/2021, 11:42 PM  [facility] [address] [place], [place], [address] Phone: [phone]   Fax:  [phone]   Name: Wesley Hancock MRN: [id number] Date of Birth: [birth year]

## 2021-04-02 ENCOUNTER — Ambulatory Visit: Payer: BC Managed Care – PPO

## 2021-04-02 ENCOUNTER — Encounter: Payer: Self-pay | Admitting: Pulmonary Disease

## 2021-04-04 NOTE — Progress Notes (Signed)
Office Visit Note  Patient: Wesley Hancock             Date of Birth: [birth year]           MRN: [id number]             PCP: Inda Coke, PA Referring: Inda Coke, PA Visit Date: 04/17/2021 Occupation: '@[email or web address]' @  Subjective:  Medication monitoring.   History of Present Illness: Wesley Hancock is a 50 y.o. male with history of mixed connective tissue disease and antiphospholipid syndrome.  He states he started taking hydroxychloroquine after his last visit.  He has not had AVISE labs yet.  He denies any history of oral ulcers, nasal ulcers, malar rash, photosensitivity, Raynaud's phenomenon or inflammatory arthritis.  He states he has been tolerating CellCept and Plaquenil.  He has some shortness of breath on exertion.  He has been followed by Dr. Erin Fulling closely.  He has an appointment coming up with the hematologist next week.  Activities of Daily Living:  Patient reports morning stiffness for 0 minute.   Patient Denies nocturnal pain.  Difficulty dressing/grooming: Denies Difficulty climbing stairs: Denies Difficulty getting out of chair: Denies Difficulty using hands for taps, buttons, cutlery, and/or writing: Denies  Review of Systems  Constitutional: Negative for fatigue and night sweats.  HENT: Negative for mouth sores, mouth dryness and nose dryness.   Eyes: Negative for redness and dryness.  Respiratory: Positive for shortness of breath. Negative for difficulty breathing.   Cardiovascular: Negative for chest pain, palpitations, hypertension, irregular heartbeat and swelling in legs/feet.  Gastrointestinal: Negative for constipation and diarrhea.  Endocrine: Negative for increased urination.  Musculoskeletal: Positive for morning stiffness. Negative for arthralgias, joint pain, joint swelling, myalgias, muscle weakness, muscle tenderness and myalgias.  Skin: Negative for color change, rash, hair loss, nodules/bumps, skin tightness, ulcers and sensitivity to  sunlight.  Allergic/Immunologic: Negative for susceptible to infections.  Neurological: Positive for dizziness. Negative for fainting, memory loss, night sweats and weakness ( ).  Hematological: Positive for bruising/bleeding tendency. Negative for swollen glands.  Psychiatric/Behavioral: Negative for depressed mood and sleep disturbance. The patient is not nervous/anxious.     PMFS History:  Patient Active Problem List   Diagnosis Date Noted  . Long term (current) use of anticoagulants 03/26/2021  . CVA (cerebral vascular accident) ([place]) 03/20/2021  . Stroke ([place]) 03/20/2021  . Mixed connective tissue disease ([place]) 01/21/2021  . Multinodular goiter 01/10/2021  . OSA on CPAP 10/11/2020  . Esophageal spasm   . Colon polyp   . Arthritis   . APS (antiphospholipid syndrome) ([place])   . Anxiety   . CKD (chronic kidney disease) stage 2, GFR 60-89 ml/min 07/11/2020  . Recurrent pneumonia 07/09/2020  . Snoring 07/09/2020  . Witnessed apneic spells 07/09/2020  . Pneumonia 06/26/2020  . Diarrhea 05/21/2020  . Paresthesia of right foot 11/08/2019  . Pulmonary infiltrates 09/10/2019  . Morbid obesity due to excess calories ([place]) c/b hbp/ dvt/PE 09/10/2019  . Acute nonintractable headache 09/06/2019  . Lobar pneumonia ([place]) 08/30/2019  . Acute respiratory failure with hypoxia ([place]) 08/30/2019  . AKI (acute kidney injury) ([place]) 08/30/2019  . DVT (deep venous thrombosis) ([place]) 08/30/2019  . Multiple pulmonary emboli ([place]) 08/22/2019  . Dyspnea on exertion 09/08/2017  . Depression 06/24/2017  . GAD (generalized anxiety disorder) 08/06/2016  . Mixed hyperlipidemia 09/18/2015  . Hypertensive heart disease 09/23/2014  . Nutcracker esophagus 09/21/2014  . Lung nodule 09/21/2014    Past Medical History:  Diagnosis Date  . Acute  nonintractable headache 09/06/2019  . Acute respiratory failure with hypoxia ([place]) 08/30/2019  . AKI (acute kidney injury) ([place]) 08/30/2019  . Antiphospholipid syndrome  ([place]) 10/30/2019  . Anxiety   . APS (antiphospholipid syndrome) ([place])   . Arthritis   . Colon polyp    ? hyperplastic  . Depression   . Diarrhea 05/21/2020  . DVT (deep venous thrombosis) ([place])   . Dyspnea on exertion 09/08/2017   "Quit smoking" 2015 with onset of symptoms in 2016  Spirometry 09/08/2017  Flat f/v loop  - d/c acei 09/08/2017  - 01/04/2020   Walked RA x two laps =  approx 536f @ fast pace - stopped due to end of study/ min sob with sats of 93 % at the end of the study. - PFT's  03/08/20   FEV1 3.84 (83 % ) ratio 0..90  p 3 % improvement from saba p ? prior to study with DLCO  26.46 (77%) corrects to 4.76 (99%)    . Esophageal spasm   . Essential hypertension 09/23/2014   D/c acei 09/08/2017 due to pseudocopd   . GAD (generalized anxiety disorder) 08/06/2016  . Lobar pneumonia ([place] 08/30/2019  . Lung nodule 09/21/2014  . Lupus ([place]   . Mixed hyperlipidemia 09/18/2015  . Morbid obesity due to excess calories ([place] c/b hbp/ dvt/PE 09/10/2019  . 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 Nutcracker esophagus   . Paresthesia of right foot 11/08/2019  . Pulmonary infiltrates 09/10/2019   In setting of bilateral PE 08/23/2019 with antiphospholipid syndrome   . Recurrent pneumonia 07/09/2020   Formatting of this note might be different from the original. 06/26/20, 03/08/20  . Snoring 07/09/2020  . Upper airway cough syndrome 01/05/2020   Onset mid Jan 2021 while on otc PPI  - max rx for gerd 01/04/2020 >>>     . Witnessed apneic spells 07/09/2020    Family History  Problem Relation Age of Onset  . Diabetes Mother   . Hyperlipidemia Mother   . Hypertension Mother   . Thyroid disease Mother        uncertain type--had surg--no cancer  . Heart disease Father 424 . Hyperlipidemia Father   . Hypertension  Father   . Prostate cancer Father 641 . Lung cancer Father 758      Dx 06/18/2017  . Colon polyps Father   . Irritable bowel syndrome Father   . Diverticulitis Father   . Diabetes Sister   . Hyperlipidemia Sister   . Hypertension Sister   . Diabetes Maternal Grandmother   . Heart disease Maternal Grandmother   . Hyperlipidemia Maternal Grandmother   . Hypertension Maternal Grandmother   . Colon cancer Maternal Grandmother   . Heart disease Maternal Grandfather   . Hyperlipidemia Maternal Grandfather   . Hypertension Maternal Grandfather   . Stroke Maternal Grandfather   . Liver cancer Maternal Grandfather   . Irritable bowel syndrome Maternal Grandfather   . Heart disease Paternal Grandmother   . Healthy Son   . Healthy Daughter   . Healthy Daughter    Past Surgical History:  Procedure Laterality Date  . BRONCHIAL WASHINGS  01/14/2021   Procedure: BRONCHIAL WASHINGS;  Surgeon: DFreddi Starr MD;  Location: [facility];  Service: Pulmonary;;  . CHOLECYSTECTOMY    .  VIDEO BRONCHOSCOPY N/A 01/14/2021   Procedure: VIDEO BRONCHOSCOPY WITHOUT FLUORO;  Surgeon: Freddi Starr, MD;  Location: [facility];  Service: Pulmonary;  Laterality: N/A;   Social History   Social History Narrative   Married (03/2016)   Lives in [place], [place] (Originally from [place] )   Some family in [place] live in [place].   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   Did play minor league baseball. ([place])   Likes to hunt, fish and plays in competitive soft ball league (slow pitch)   Interest includes trips to [employer] and collecting colorful socks.   Immunization History  Administered Date(s) Administered  . Influenza Inj Mdck Quad Pf 12/13/2020  . Influenza,inj,Quad PF,6+ Mos 09/14/2016, 09/08/2017, 09/06/2019  . Influenza,inj,quad, With Preservative 09/18/2015  . PFIZER(Purple Top)SARS-COV-2 Vaccination 02/19/2020, 03/04/2020, 12/13/2020   . Td 11/11/2016  . Tdap 02/28/2019     Objective: Vital Signs: BP 122/84 (BP Location: Left Arm, Patient Position: Sitting, Cuff Size: Large)   Pulse 90   Resp 17   Ht '6\' 2"'  (1.88 m)   Wt 292 lb 3.2 oz (132.5 kg)   BMI 37.52 kg/m    Physical Exam Vitals and nursing note reviewed.  Constitutional:      Appearance: He is well-developed.  HENT:     Head: Normocephalic and atraumatic.  Eyes:     Conjunctiva/sclera: Conjunctivae normal.     Pupils: Pupils are equal, round, and reactive to light.  Cardiovascular:     Rate and Rhythm: Normal rate and regular rhythm.     Heart sounds: Normal heart sounds.  Pulmonary:     Effort: Pulmonary effort is normal.     Breath sounds: Normal breath sounds.  Abdominal:     General: Bowel sounds are normal.     Palpations: Abdomen is soft.  Musculoskeletal:     Cervical back: Normal range of motion and neck supple.  Skin:    General: Skin is warm and dry.     Capillary Refill: Capillary refill takes less than 2 seconds.  Neurological:     Mental Status: He is alert and oriented to person, place, and time.  Psychiatric:        Behavior: Behavior normal.      Musculoskeletal Exam: C-spine was in good range of motion.  Shoulder joints, elbow joints, wrist joints, MCPs PIPs and DIPs with good range of motion with no synovitis.  Hip joints, knee joints, ankles, MTPs with good range of motion with no synovitis.  CDAI Exam: CDAI Score: -- Patient Global: --; Provider Global: -- Swollen: --; Tender: -- Joint Exam 04/17/2021   No joint exam has been documented for this visit   There is currently no information documented on the homunculus. Go to the Rheumatology activity and complete the homunculus joint exam.  Investigation: No additional findings.  Imaging: CT ANGIO HEAD W OR WO CONTRAST  Result Date: 03/20/2021 CLINICAL DATA:  Acute neuro deficit.  Speech difficulty EXAM: CT ANGIOGRAPHY HEAD AND NECK TECHNIQUE: Multidetector CT  imaging of the head and neck was performed using the standard protocol during bolus administration of intravenous contrast. Multiplanar CT image reconstructions and MIPs were obtained to evaluate the vascular anatomy. Carotid stenosis measurements (when applicable) are obtained utilizing NASCET criteria, using the distal internal carotid diameter as the denominator. CONTRAST:  27m OMNIPAQUE IOHEXOL 350 MG/ML SOLN COMPARISON:  CT head 02/25/2021.  MRI head 03/20/2021 FINDINGS: CT HEAD  FINDINGS Brain: Ill-defined hypodensity in the left mid frontal lobe involving cortex and white matter. This is consistent with acute infarct which shows restricted diffusion. No associated hemorrhage No other acute infarct, hemorrhage, mass.  Ventricle size normal Vascular: Negative for hyperdense vessel Skull: Negative Sinuses: Negative Orbits: Negative Review of the MIP images confirms the above findings CTA NECK FINDINGS Aortic arch: Bovine branching arch. Mild atherosclerotic disease proximal great vessels without stenosis Right carotid system: Right vertebral artery widely patent without stenosis. Minimal atherosclerotic disease right carotid bifurcation Left carotid system: No significant left carotid stenosis. No significant atherosclerotic disease Vertebral arteries: Both vertebral arteries widely patent to the basilar without stenosis. Skeleton: No acute skeletal abnormality. Other neck: 24 mm right thyroid nodule. No enlarged lymph nodes in the neck. Upper chest: Lung apices clear bilaterally. Review of the MIP images confirms the above findings CTA HEAD FINDINGS Anterior circulation: Cavernous carotid widely patent bilaterally. Anterior and middle cerebral arteries patent bilaterally. Left M1 segment widely patent. Decreased caliber and irregularity of 2 branches in the superior division of the left MCA likely due to thrombus with recent infarct in the left frontal lobe. This is best seen on series 17, image 25. There is  distal occlusion of these vessels. Both anterior cerebral arteries widely patent. Right middle cerebral artery widely patent. Posterior circulation: Both vertebral arteries widely patent to the basilar. PICA patent bilaterally. Basilar widely patent. Superior cerebellar and posterior cerebral arteries patent without stenosis Venous sinuses: Normal venous enhancement. Anatomic variants: None Review of the MIP images confirms the above findings IMPRESSION: 1. Hypodensity left frontal lobe compatible with acute infarct as identified on MRI today. There is small caliber and irregularity of 2 branches supplying this area involving the superior division left MCA compatible with thrombus, with distal occlusion. 2. Otherwise no intracranial stenosis 3. No extracranial stenosis in the carotid or vertebral arteries. 4. Code stroke imaging results were communicated on 03/20/2021 at 3:39 pm to provider Rory Percy via text page 5. 24 mm right thyroid nodule. Refer to recommendations on thyroid ultrasound 01/07/2021 Electronically Signed   By: Franchot Gallo M.D.   On: 03/20/2021 15:43   CT ANGIO NECK W OR WO CONTRAST  Result Date: 03/20/2021 CLINICAL DATA:  Acute neuro deficit.  Speech difficulty EXAM: CT ANGIOGRAPHY HEAD AND NECK TECHNIQUE: Multidetector CT imaging of the head and neck was performed using the standard protocol during bolus administration of intravenous contrast. Multiplanar CT image reconstructions and MIPs were obtained to evaluate the vascular anatomy. Carotid stenosis measurements (when applicable) are obtained utilizing NASCET criteria, using the distal internal carotid diameter as the denominator. CONTRAST:  27m OMNIPAQUE IOHEXOL 350 MG/ML SOLN COMPARISON:  CT head 02/25/2021.  MRI head 03/20/2021 FINDINGS: CT HEAD FINDINGS Brain: Ill-defined hypodensity in the left mid frontal lobe involving cortex and white matter. This is consistent with acute infarct which shows restricted diffusion. No associated  hemorrhage No other acute infarct, hemorrhage, mass.  Ventricle size normal Vascular: Negative for hyperdense vessel Skull: Negative Sinuses: Negative Orbits: Negative Review of the MIP images confirms the above findings CTA NECK FINDINGS Aortic arch: Bovine branching arch. Mild atherosclerotic disease proximal great vessels without stenosis Right carotid system: Right vertebral artery widely patent without stenosis. Minimal atherosclerotic disease right carotid bifurcation Left carotid system: No significant left carotid stenosis. No significant atherosclerotic disease Vertebral arteries: Both vertebral arteries widely patent to the basilar without stenosis. Skeleton: No acute skeletal abnormality. Other neck: 24 mm right thyroid nodule. No enlarged lymph nodes in  the neck. Upper chest: Lung apices clear bilaterally. Review of the MIP images confirms the above findings CTA HEAD FINDINGS Anterior circulation: Cavernous carotid widely patent bilaterally. Anterior and middle cerebral arteries patent bilaterally. Left M1 segment widely patent. Decreased caliber and irregularity of 2 branches in the superior division of the left MCA likely due to thrombus with recent infarct in the left frontal lobe. This is best seen on series 17, image 25. There is distal occlusion of these vessels. Both anterior cerebral arteries widely patent. Right middle cerebral artery widely patent. Posterior circulation: Both vertebral arteries widely patent to the basilar. PICA patent bilaterally. Basilar widely patent. Superior cerebellar and posterior cerebral arteries patent without stenosis Venous sinuses: Normal venous enhancement. Anatomic variants: None Review of the MIP images confirms the above findings IMPRESSION: 1. Hypodensity left frontal lobe compatible with acute infarct as identified on MRI today. There is small caliber and irregularity of 2 branches supplying this area involving the superior division left MCA compatible with  thrombus, with distal occlusion. 2. Otherwise no intracranial stenosis 3. No extracranial stenosis in the carotid or vertebral arteries. 4. Code stroke imaging results were communicated on 03/20/2021 at 3:39 pm to provider Rory Percy via text page 5. 24 mm right thyroid nodule. Refer to recommendations on thyroid ultrasound 01/07/2021 Electronically Signed   By: Franchot Gallo M.D.   On: 03/20/2021 15:43   MR Brain Wo Contrast (neuro protocol)  Result Date: 03/20/2021 CLINICAL DATA:  Neuro deficit, acute stroke suspected. EXAM: MRI HEAD WITHOUT CONTRAST TECHNIQUE: Multiplanar, multiecho pulse sequences of the brain and surrounding structures were obtained without intravenous contrast. COMPARISON:  CT head February 25, 2021.  MRI head September 06, 2019 FINDINGS: Brain: Acute infarct involving the left frontal cortex and operculum. Mild associated edema without mass effect. No acute hemorrhage. No hydrocephalus. Dilated perivascular spaces in the inferior basal ganglia bilaterally. No mass lesion. No midline shift. Basal cisterns are patent. No extra-axial fluid collections. Vascular: Major arterial flow voids are maintained at the skull base. Skull and upper cervical spine: Normal marrow signal. Sinuses/Orbits: Clear sinuses.  Unremarkable orbits. Other: No mastoid effusions. IMPRESSION: Acute infarct involving the left frontal cortex and operculum. Mild associated edema without mass effect. Electronically Signed   By: Margaretha Sheffield MD   On: 03/20/2021 13:43   CT CEREBRAL PERFUSION W CONTRAST  Result Date: 03/20/2021 EXAM: CT PERFUSION BRAIN TECHNIQUE: Multiphase CT imaging of the brain was performed following IV bolus contrast injection. Subsequent parametric perfusion maps were calculated using RAPID software. CONTRAST:  49m OMNIPAQUE IOHEXOL 350 MG/ML SOLN COMPARISON:  Same day CTA and MRI FINDINGS: CT Brain Perfusion Findings: CBF (<30%) Volume: 037mPerfusion (Tmax>6.0s) volume: 15ML Mismatch Volume: 1552mInfarct Core: 0 mL Infarction Location:None identified. IMPRESSION: RAPID calculates an approximately 15 mL area of mismatch in the left frontal lobe; however, given that this area is relatively similar in size/extent to the hypodensity seen on same day CTA the penumbra is favored to be less than 15 mL and probably not significant. Findings discussed with Dr. AroRory Percy 4:47 p.m. Electronically Signed   By: FreMargaretha Sheffield   On: 03/20/2021 16:54   ECHOCARDIOGRAM COMPLETE BUBBLE STUDY  Result Date: 03/22/2021    ECHOCARDIOGRAM REPORT   Patient Name:   TIMKERIN CECCHIte of Exam: 03/22/2021 Medical Rec #:  [id number]        Height:       74.0 in Accession #:    [id number]  Weight:       240.0 lb Date of Birth:  31-Oct-1971            BSA:          2.349 m Patient Age:    69 years            BP:           135/88 mmHg Patient Gender: M                   HR:           81 bpm. Exam Location:  Inpatient Procedure: 2D Echo, Cardiac Doppler, Color Doppler and Saline Contrast Bubble            Study Indications:    TIA  History:        Patient has prior history of Echocardiogram examinations. Risk                 Factors:Hypertension.  Sonographer:    Cammy Brochure Referring Phys: [id number] ASHISH ARORA IMPRESSIONS  1. Left ventricular ejection fraction, by estimation, is 55 to 60%. The left ventricle has normal function. The left ventricle has no regional wall motion abnormalities. There is mild left ventricular hypertrophy. Left ventricular diastolic parameters are indeterminate.  2. Right ventricular systolic function is normal. The right ventricular size is normal. Tricuspid regurgitation signal is inadequate for assessing PA pressure.  3. The mitral valve is normal in structure. No evidence of mitral valve regurgitation.  4. The aortic valve is tricuspid. Aortic valve regurgitation is not visualized. No aortic stenosis is present.  5. The inferior vena cava is normal in size with greater than 50%  respiratory variability, suggesting right atrial pressure of 3 mmHg.  6. Agitated saline contrast bubble study was negative, with no evidence of any interatrial shunt. FINDINGS  Left Ventricle: Left ventricular ejection fraction, by estimation, is 55 to 60%. The left ventricle has normal function. The left ventricle has no regional wall motion abnormalities. The left ventricular internal cavity size was normal in size. There is  mild left ventricular hypertrophy. Left ventricular diastolic parameters are indeterminate. Right Ventricle: The right ventricular size is normal. No increase in right ventricular wall thickness. Right ventricular systolic function is normal. Tricuspid regurgitation signal is inadequate for assessing PA pressure. Left Atrium: Left atrial size was normal in size. Right Atrium: Right atrial size was normal in size. Pericardium: There is no evidence of pericardial effusion. Mitral Valve: The mitral valve is normal in structure. No evidence of mitral valve regurgitation. Tricuspid Valve: The tricuspid valve is normal in structure. Tricuspid valve regurgitation is not demonstrated. Aortic Valve: The aortic valve is tricuspid. Aortic valve regurgitation is not visualized. No aortic stenosis is present. Aortic valve mean gradient measures 6.0 mmHg. Aortic valve peak gradient measures 11.7 mmHg. Aortic valve area, by VTI measures 3.08  cm. Pulmonic Valve: The pulmonic valve was grossly normal. Pulmonic valve regurgitation is not visualized. Aorta: The aortic root and ascending aorta are structurally normal, with no evidence of dilitation. Venous: The inferior vena cava is normal in size with greater than 50% respiratory variability, suggesting right atrial pressure of 3 mmHg. IAS/Shunts: No atrial level shunt detected by color flow Doppler. Agitated saline contrast was given intravenously to evaluate for intracardiac shunting. Agitated saline contrast bubble study was negative, with no evidence of  any interatrial shunt.  LEFT VENTRICLE PLAX 2D LVIDd:         4.40 cm  Diastology LVIDs:         3.10 cm  LV e' medial:    6.96 cm/s LV PW:         1.20 cm  LV E/e' medial:  10.5 LV IVS:        1.30 cm  LV e' lateral:   8.16 cm/s LVOT diam:     2.10 cm  LV E/e' lateral: 8.9 LV SV:         85 LV SV Index:   36 LVOT Area:     3.46 cm  RIGHT VENTRICLE             IVC RV Basal diam:  3.30 cm     IVC diam: 1.40 cm RV S prime:     19.90 cm/s TAPSE (M-mode): 3.1 cm LEFT ATRIUM             Index       RIGHT ATRIUM           Index LA diam:        3.60 cm 1.53 cm/m  RA Area:     15.00 cm LA Vol (A2C):   57.8 ml 24.61 ml/m RA Volume:   38.10 ml  16.22 ml/m LA Vol (A4C):   56.3 ml 23.97 ml/m LA Biplane Vol: 59.6 ml 25.38 ml/m  AORTIC VALVE AV Area (Vmax):    2.96 cm AV Area (Vmean):   3.12 cm AV Area (VTI):     3.08 cm AV Vmax:           171.00 cm/s AV Vmean:          120.000 cm/s AV VTI:            0.277 m AV Peak Grad:      11.7 mmHg AV Mean Grad:      6.0 mmHg LVOT Vmax:         146.00 cm/s LVOT Vmean:        108.000 cm/s LVOT VTI:          0.246 m LVOT/AV VTI ratio: 0.89  AORTA Ao Root diam: 3.30 cm Ao Asc diam:  3.30 cm MITRAL VALVE MV Area (PHT): 3.19 cm    SHUNTS MV Decel Time: 238 msec    Systemic VTI:  0.25 m MV E velocity: 72.80 cm/s  Systemic Diam: 2.10 cm MV A velocity: 70.70 cm/s MV E/A ratio:  1.03 Oswaldo Milian MD Electronically signed by Oswaldo Milian MD Signature Date/Time: 03/22/2021/1:42:32 PM    Final     Recent Labs: Lab Results  Component Value Date   WBC 15.8 (H) 03/24/2021   HGB 15.5 03/24/2021   PLT 267 03/24/2021   NA 139 03/23/2021   K 4.7 03/23/2021   CL 105 03/23/2021   CO2 24 03/23/2021   GLUCOSE 107 (H) 03/23/2021   BUN 16 03/23/2021   CREATININE 1.24 03/23/2021   BILITOT 0.9 03/20/2021   ALKPHOS 48 03/20/2021   AST 27 03/20/2021   ALT 42 03/20/2021   PROT 7.3 03/26/2021   ALBUMIN 4.5 03/20/2021   CALCIUM 9.2 03/23/2021   GFRAA 84 10/14/2020    QFTBGOLDPLUS Negative 01/15/2021   March 26, 2021 SPEP normal, IgA elevated, TSH normal, G6PD normal   03/ 14 2022 MPO and PR-3 antibodies negative.  P-ANCA negative, c-ANCA negative  February 01, 2021 CK 89  March 20, 2021 Jo1 negative, UA negative, ESR 29  October 21, 2020 anticardiolipin IgG 67, lupus anticoagulant positive  July 18, 2020  hepatitis B-, hepatitis C negative June 27, 2020 HIV negative January 15, 2021 TB Gold negative  Speciality Comments: Plaquenil 200 mg p.o. twice daily started March 26, 2021  Procedures:  No procedures performed Allergies: Diltiazem hcl   Assessment / Plan:     Visit Diagnoses: Mixed connective tissue disease ([place]) - +ANA,+dsDNA,+RNP,+aCL,+LA. H/o CVA,PE,DVTs,ILD,arthralgias.AVISE labs are pending.  Patient will have them drawn today.  He denies any history of oral ulcers, nasal ulcers, malar rash, photosensitivity, Raynaud's phenomenon or inflammatory arthritis.  ILD (interstitial lung disease) ([place]) - Bilateral groundglass infiltrates on CT scan.  Mild diffusion defect on PFTs.  He is on CellCept.  He is followed by Dr. Erin Fulling.  APS (antiphospholipid syndrome) ([name]) - Followed by hematology.  He is on Coumadin.  High risk medication use - CellCept 1 g p.o. twice daily, Bactrim prophylaxis, prednisone 30 mg p.o. daily.  Plaquenil 200 mg p.o. twice daily started March 26, 2021.  He has been tolerating medications well.  He has been advised to get his eye examination.  Labs are monitored by Dr. Erin Fulling.  Cerebrovascular accident (CVA), unspecified mechanism [facility]) - April 2022 with expressive aphasia.  He was started on Coumadin.  Multiple pulmonary emboli [facility]) - September 2019.  Deep vein thrombosis (DVT) of right lower extremity, unspecified chronicity, unspecified vein ([name])  Pulmonary infiltrates  Other medical problems are listed as follows:  Hypertensive heart disease without heart failure  Mixed hyperlipidemia-weight loss  diet and exercise was discussed.  Esophageal spasm  Nutcracker esophagus  OSA on CPAP-he states his insomnia is better on CPAP.  Multinodular goiter  GAD (generalized anxiety disorder)  Orders: No orders of the defined types were placed in this encounter.  No orders of the defined types were placed in this encounter.   Follow-Up Instructions: Return in about 3 months (around 07/18/2021) for MCTD, APS.   Bo Merino, MD  Note - This record has been created using Editor, commissioning.  Chart creation errors have been sought, but may not always  have been located. Such creation errors do not reflect on  the standard of medical care.

## 2021-04-07 ENCOUNTER — Ambulatory Visit (INDEPENDENT_AMBULATORY_CARE_PROVIDER_SITE_OTHER): Payer: BC Managed Care – PPO | Admitting: General Practice

## 2021-04-07 ENCOUNTER — Other Ambulatory Visit: Payer: Self-pay | Admitting: General Practice

## 2021-04-07 ENCOUNTER — Other Ambulatory Visit: Payer: Self-pay

## 2021-04-07 DIAGNOSIS — Z7901 Long term (current) use of anticoagulants: Secondary | ICD-10-CM | POA: Diagnosis not present

## 2021-04-07 LAB — POCT INR: INR: 3.5 — AB (ref 2.0–3.0)

## 2021-04-07 MED ORDER — WARFARIN SODIUM 5 MG PO TABS: ORAL_TABLET | ORAL | 0 refills | Status: DC

## 2021-04-07 NOTE — Patient Instructions (Signed)
Pre visit review using our clinic review tool, if applicable. No additional management support is needed unless otherwise documented below in the visit note.  Take 2 tablets today and then continue to take 2 1/2 tablets on Mondays and Fridays.  Re-check in 1 week.     *Patient is taking 30 mg of prednisone for 30 days. *Patient is also starting Bactrim for 2 to 3 months.

## 2021-04-08 ENCOUNTER — Ambulatory Visit: Payer: BC Managed Care – PPO

## 2021-04-11 ENCOUNTER — Ambulatory Visit: Payer: BC Managed Care – PPO

## 2021-04-11 ENCOUNTER — Other Ambulatory Visit: Payer: Self-pay

## 2021-04-11 DIAGNOSIS — R41841 Cognitive communication deficit: Secondary | ICD-10-CM | POA: Diagnosis not present

## 2021-04-11 DIAGNOSIS — R4701 Aphasia: Secondary | ICD-10-CM

## 2021-04-11 NOTE — Therapy (Signed)
[facility] [address] [place], [place], [address] Phone: [phone]   Fax:  [phone]  Speech Language Pathology Treatment  Patient Details  Name: Wesley Hancock MRN: [id number] Date of Birth: [birth year] Referring Provider (SLP): Arrien, Jimmy Picket, MD (Doc/PCP- Inda Coke)   Encounter Date: 04/11/2021   End of Session - 04/11/21 1218    Visit Number 3    Number of Visits 17    Date for SLP Re-Evaluation 06/25/21    Authorization Type BCBS    SLP Start Time 7741    SLP Stop Time  2878    SLP Time Calculation (min) 45 min    Activity Tolerance Patient tolerated treatment well           Past Medical History:  Diagnosis Date  . Acute nonintractable headache 09/06/2019  . Acute respiratory failure with hypoxia ([place]) 08/30/2019  . AKI (acute kidney injury) ([place]) 08/30/2019  . Antiphospholipid syndrome ([place]) 10/30/2019  . Anxiety   . APS (antiphospholipid syndrome) ([place])   . Arthritis   . Colon polyp    ? hyperplastic  . Depression   . Diarrhea 05/21/2020  . DVT (deep venous thrombosis) ([place])   . Dyspnea on exertion 09/08/2017   "Quit smoking" 2015 with onset of symptoms in 2016  Spirometry 09/08/2017  Flat f/v loop  - d/c acei 09/08/2017  - 01/04/2020   Walked RA x two laps =  approx 526ft @ fast pace - stopped due to end of study/ min sob with sats of 93 % at the end of the study. - PFT's  03/08/20   FEV1 3.84 (83 % ) ratio 0..90  p 3 % improvement from saba p ? prior to study with DLCO  26.46 (77%) corrects to 4.76 (99%)    . Esophageal spasm   . Essential hypertension 09/23/2014   D/c acei 09/08/2017 due to pseudocopd   . GAD (generalized anxiety disorder) 08/06/2016  . Lobar pneumonia ([place]) 08/30/2019  . Lung nodule 09/21/2014  . Lupus ([place])   . Mixed hyperlipidemia 09/18/2015  . Morbid obesity due to excess calories ([place]) c/b hbp/ dvt/PE 09/10/2019  . 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 Nutcracker esophagus   . Paresthesia of right foot 11/08/2019  . Pulmonary infiltrates 09/10/2019   In setting of bilateral PE 08/23/2019 with antiphospholipid syndrome   . Recurrent pneumonia 07/09/2020   Formatting of this note might be different from the original. 06/26/20, 03/08/20  . Snoring 07/09/2020  . Upper airway cough syndrome 01/05/2020   Onset mid Jan 2021 while on otc PPI  - max rx for gerd 01/04/2020 >>>     . Witnessed apneic spells 07/09/2020    Past Surgical History:  Procedure Laterality Date  . BRONCHIAL WASHINGS  01/14/2021   Procedure: BRONCHIAL WASHINGS;  Surgeon: Freddi Starr, MD;  Location: [facility];  Service: Pulmonary;;  . CHOLECYSTECTOMY    . VIDEO BRONCHOSCOPY N/A 01/14/2021   Procedure: VIDEO BRONCHOSCOPY WITHOUT FLUORO;  Surgeon: Freddi Starr, MD;  Location: [facility];  Service: Pulmonary;  Laterality: N/A;    There were no vitals filed for this visit.   Subjective Assessment - 04/11/21 1219    Subjective "very good"    Currently in Pain? No/denies  ADULT SLP TREATMENT - 04/11/21 1218      General Information   Behavior/Cognition Alert;Pleasant mood;Cooperative      Cognitive-Linquistic Treatment   Treatment focused on Aphasia;Cognition    Skilled Treatment Pt reports improvements in word finding, with increased carryover of slowing down. Pt continues to report difficulty with reading comprehension, which family has targeted via reading recipes and answering questions. Pt stated "I often need to re-read it twice" and endorsed needed for additional processing time. Errors x2 exhibited while reading recipe in ST session (TSP vs tsp, 1/4 versus 1/2), in which pt aware of errors with rare min A. Additional processing time required to anwer  subsequent questions. Anomia x3 exhibited in conversation, in which pt able to ID word 2/3 opportunities with additional time. SLP provided high level word finding practice and functional reading practice as HEP.      Assessment / Recommendations / Plan   Plan Continue with current plan of care      Progression Toward Goals   Progression toward goals Progressing toward goals            SLP Education - 04/11/21 1321    Education Details reading comprehension, visual cues, word finding compensations/practice    Person(s) Educated Patient    Methods Explanation;Demonstration;Handout    Comprehension Verbalized understanding;Returned demonstration;Need further instruction            SLP Short Term Goals - 04/11/21 1218      SLP SHORT TERM GOAL #1   Title Pt will complete HEP with rare min A over 3 sessions    Baseline 04-11-21    Time 3    Period Weeks    Status On-going      SLP SHORT TERM GOAL #2   Title Pt will utilize speech compensations in 15 minute mod complex conversations with rare min A over 2 sessions    Time 3    Period Weeks    Status On-going      SLP SHORT TERM GOAL #3   Title Pt will demo awareness of need to reduce speech/processing speed for 4/5 opportunities in conversation with occasional min A over 2 sessions    Baseline 04-01-21    Time 3    Period Weeks    Status On-going      SLP SHORT TERM GOAL #4   Title Pt will answer (yes/no/WH-) questions regarding paragraph length information with 80% accuracy given rare min A for comprehension over 2 sessions    Baseline 04-01-21    Time 3    Period Weeks    Status On-going            SLP Long Term Goals - 04/11/21 1219      SLP LONG TERM GOAL #1   Title Pt will utilize speech compensations in 30 minute mod complex to complex conversation with rare min A over 2 sessions    Time 7    Period Weeks    Status On-going      SLP LONG TERM GOAL #2   Title Pt will demo awareness of need to reduce  speech/processing speed for 8/10 opportunities in conversation with rare min A over 2 sessions    Time 7    Period Weeks    Status On-going      SLP LONG TERM GOAL #3   Title Pt will comprehend mod complex written communication related to medical information or topics of personal interst with rare min A over 2 sessions  Time 7    Period Weeks    Status On-going      SLP LONG TERM GOAL #4   Title Pt will report less frustration and improved communication effectiveness via QOL by last ST session    Time 7    Period Weeks    Status On-going            Plan - 04/11/21 1323    Clinical Impression Statement Wesley Hancock") presents with occasional anomia in conversation and reported persistent difficulty with reading comprehension. Pt reports improved carryover of slower rate from his habitual rate; however, occasional verbal and visual cues required to slow rate of speech in conversation and during structured reading tasks this session. Pt benefits from repetition of information to improve comprehension. Anomia x3 noted in conversation this session, with moderate SLP assistance required x1 to correct. Skilled ST is warranted to address mild expressive and receptive aphasia to improve communication effectiveness with all conversational partners and to improve QOL.    Speech Therapy Frequency 2x / week    Duration 8 weeks   or 17 total visits   Treatment/Interventions Functional tasks;Cueing hierarchy;Patient/family education;Multimodal communcation approach;Language facilitation;Compensatory techniques;Internal/external aids;SLP instruction and feedback;Cognitive reorganization    Potential to Achieve Goals Good    SLP Home Exercise Plan provided    Consulted and Agree with Plan of Care Patient           Patient will benefit from skilled therapeutic intervention in order to improve the following deficits and impairments:   Aphasia  Cognitive communication deficit    Problem  List Patient Active Problem List   Diagnosis Date Noted  . Long term (current) use of anticoagulants 03/26/2021  . CVA (cerebral vascular accident) ([place]) 03/20/2021  . Stroke ([place]) 03/20/2021  . Mixed connective tissue disease ([place]) 01/21/2021  . Multinodular goiter 01/10/2021  . OSA on CPAP 10/11/2020  . Esophageal spasm   . Colon polyp   . Arthritis   . APS (antiphospholipid syndrome) ([place])   . Anxiety   . CKD (chronic kidney disease) stage 2, GFR 60-89 ml/min 07/11/2020  . Recurrent pneumonia 07/09/2020  . Snoring 07/09/2020  . Witnessed apneic spells 07/09/2020  . Pneumonia 06/26/2020  . Diarrhea 05/21/2020  . Paresthesia of right foot 11/08/2019  . Pulmonary infiltrates 09/10/2019  . Morbid obesity due to excess calories ([place]) c/b hbp/ dvt/PE 09/10/2019  . Acute nonintractable headache 09/06/2019  . Lobar pneumonia ([place]) 08/30/2019  . Acute respiratory failure with hypoxia ([place]) 08/30/2019  . AKI (acute kidney injury) ([place]) 08/30/2019  . DVT (deep venous thrombosis) ([place]) 08/30/2019  . Multiple pulmonary emboli ([place]) 08/22/2019  . Dyspnea on exertion 09/08/2017  . Depression 06/24/2017  . GAD (generalized anxiety disorder) 08/06/2016  . Mixed hyperlipidemia 09/18/2015  . Hypertensive heart disease 09/23/2014  . Nutcracker esophagus 09/21/2014  . Lung nodule 09/21/2014    Alinda Deem, MA CCC-SLP 04/11/2021, 1:29 PM  [facility] [address] [place], [place], [address] Phone: [phone]   Fax:  [phone]   Name: Wesley Hancock MRN: [id number] Date of Birth: 08-28-1971

## 2021-04-14 ENCOUNTER — Ambulatory Visit: Payer: BC Managed Care – PPO

## 2021-04-14 ENCOUNTER — Ambulatory Visit (INDEPENDENT_AMBULATORY_CARE_PROVIDER_SITE_OTHER): Payer: BC Managed Care – PPO | Admitting: General Practice

## 2021-04-14 ENCOUNTER — Other Ambulatory Visit: Payer: Self-pay

## 2021-04-14 DIAGNOSIS — Z7901 Long term (current) use of anticoagulants: Secondary | ICD-10-CM | POA: Diagnosis not present

## 2021-04-14 LAB — POCT INR: INR: 3.5 — AB (ref 2.0–3.0)

## 2021-04-14 NOTE — Patient Instructions (Addendum)
Pre visit review using our clinic review tool, if applicable. No additional management support is needed unless otherwise documented below in the visit note.  Change dosage and take 2 tablets daily (10 mg).    Re-check on 6/1. *Patient is taking 30 mg of prednisone for 30 days. *Patient is also starting Bactrim for 2 to 3 months.

## 2021-04-16 ENCOUNTER — Ambulatory Visit: Payer: BC Managed Care – PPO

## 2021-04-17 ENCOUNTER — Other Ambulatory Visit: Payer: Self-pay

## 2021-04-17 ENCOUNTER — Encounter: Payer: Self-pay | Admitting: Rheumatology

## 2021-04-17 ENCOUNTER — Ambulatory Visit (INDEPENDENT_AMBULATORY_CARE_PROVIDER_SITE_OTHER): Payer: BC Managed Care – PPO | Admitting: Rheumatology

## 2021-04-17 VITALS — BP 122/84 | HR 90 | Resp 17 | Ht 74.0 in | Wt 292.2 lb

## 2021-04-17 DIAGNOSIS — M351 Other overlap syndromes: Secondary | ICD-10-CM | POA: Diagnosis not present

## 2021-04-17 DIAGNOSIS — Z9989 Dependence on other enabling machines and devices: Secondary | ICD-10-CM

## 2021-04-17 DIAGNOSIS — R918 Other nonspecific abnormal finding of lung field: Secondary | ICD-10-CM

## 2021-04-17 DIAGNOSIS — Z79899 Other long term (current) drug therapy: Secondary | ICD-10-CM

## 2021-04-17 DIAGNOSIS — F411 Generalized anxiety disorder: Secondary | ICD-10-CM

## 2021-04-17 DIAGNOSIS — D6861 Antiphospholipid syndrome: Secondary | ICD-10-CM | POA: Diagnosis not present

## 2021-04-17 DIAGNOSIS — J849 Interstitial pulmonary disease, unspecified: Secondary | ICD-10-CM | POA: Diagnosis not present

## 2021-04-17 DIAGNOSIS — I2699 Other pulmonary embolism without acute cor pulmonale: Secondary | ICD-10-CM

## 2021-04-17 DIAGNOSIS — E782 Mixed hyperlipidemia: Secondary | ICD-10-CM

## 2021-04-17 DIAGNOSIS — I82401 Acute embolism and thrombosis of unspecified deep veins of right lower extremity: Secondary | ICD-10-CM

## 2021-04-17 DIAGNOSIS — K224 Dyskinesia of esophagus: Secondary | ICD-10-CM

## 2021-04-17 DIAGNOSIS — I119 Hypertensive heart disease without heart failure: Secondary | ICD-10-CM

## 2021-04-17 DIAGNOSIS — I639 Cerebral infarction, unspecified: Secondary | ICD-10-CM

## 2021-04-17 DIAGNOSIS — G4733 Obstructive sleep apnea (adult) (pediatric): Secondary | ICD-10-CM

## 2021-04-17 DIAGNOSIS — E042 Nontoxic multinodular goiter: Secondary | ICD-10-CM

## 2021-04-17 NOTE — Progress Notes (Deleted)
Office Visit Note  Patient: Wesley Hancock             Date of Birth: Oct 05, 1971           MRN: [id number]             PCP: Inda Coke, PA Referring: Inda Coke, PA Visit Date: 04/17/2021 Occupation: @GUAROCC @  Subjective:  No chief complaint on file.   History of Present Illness: Wesley Hancock is a 50 y.o. male ***   Activities of Daily Living:  Patient reports morning stiffness for 20-30 minutes.   Patient Denies nocturnal pain.  Difficulty dressing/grooming: Denies Difficulty climbing stairs: Denies Difficulty getting out of chair: Denies Difficulty using hands for taps, buttons, cutlery, and/or writing: Denies  Review of Systems  Constitutional: Negative for fatigue.  HENT: Negative for mouth sores, mouth dryness and nose dryness.   Eyes: Negative for pain, itching and dryness.  Respiratory: Positive for shortness of breath. Negative for cough, wheezing and difficulty breathing.   Cardiovascular: Negative for chest pain and palpitations.  Gastrointestinal: Negative for blood in stool, constipation and diarrhea.  Endocrine: Negative for increased urination.  Genitourinary: Negative for difficulty urinating.  Musculoskeletal: Positive for morning stiffness. Negative for arthralgias, joint pain, joint swelling, myalgias, muscle tenderness and myalgias.  Skin: Negative for color change, rash and redness.  Allergic/Immunologic: Negative for susceptible to infections.  Neurological: Positive for dizziness. Negative for numbness, headaches and weakness.  Hematological: Positive for bruising/bleeding tendency.  Psychiatric/Behavioral: Negative for sleep disturbance.    PMFS History:  Patient Active Problem List   Diagnosis Date Noted  . Long term (current) use of anticoagulants 03/26/2021  . CVA (cerebral vascular accident) ([place]) 03/20/2021  . Stroke ([place]) 03/20/2021  . Mixed connective tissue disease ([place]) 01/21/2021  . Multinodular goiter  01/10/2021  . OSA on CPAP 10/11/2020  . Esophageal spasm   . Colon polyp   . Arthritis   . APS (antiphospholipid syndrome) ([place])   . Anxiety   . CKD (chronic kidney disease) stage 2, GFR 60-89 ml/min 07/11/2020  . Recurrent pneumonia 07/09/2020  . Snoring 07/09/2020  . Witnessed apneic spells 07/09/2020  . Pneumonia 06/26/2020  . Diarrhea 05/21/2020  . Paresthesia of right foot 11/08/2019  . Pulmonary infiltrates 09/10/2019  . Morbid obesity due to excess calories ([place]) c/b hbp/ dvt/PE 09/10/2019  . Acute nonintractable headache 09/06/2019  . Lobar pneumonia ([place]) 08/30/2019  . Acute respiratory failure with hypoxia ([place]) 08/30/2019  . AKI (acute kidney injury) ([place]) 08/30/2019  . DVT (deep venous thrombosis) ([place]) 08/30/2019  . Multiple pulmonary emboli ([place]) 08/22/2019  . Dyspnea on exertion 09/08/2017  . Depression 06/24/2017  . GAD (generalized anxiety disorder) 08/06/2016  . Mixed hyperlipidemia 09/18/2015  . Hypertensive heart disease 09/23/2014  . Nutcracker esophagus 09/21/2014  . Lung nodule 09/21/2014    Past Medical History:  Diagnosis Date  . Acute nonintractable headache 09/06/2019  . Acute respiratory failure with hypoxia ([place]) 08/30/2019  . AKI (acute kidney injury) ([place]) 08/30/2019  . Antiphospholipid syndrome ([place]) 10/30/2019  . Anxiety   . APS (antiphospholipid syndrome) ([place])   . Arthritis   . Colon polyp    ? hyperplastic  . Depression   . Diarrhea 05/21/2020  . DVT (deep venous thrombosis) ([place])   . Dyspnea on exertion 09/08/2017   "Quit smoking" 2015 with onset of symptoms in 2016  Spirometry 09/08/2017  Flat f/v loop  - d/c acei 09/08/2017  - 01/04/2020   Walked RA x two laps =  approx 536ft @ fast pace - stopped due to end of study/ min sob with sats of 93 % at the end of the study. - PFT's  03/08/20   FEV1 3.84 (83 % ) ratio 0..90  p 3 % improvement from saba p ? prior to study with DLCO  26.46 (77%) corrects to 4.76 (99%)    . Esophageal spasm   .  Essential hypertension 09/23/2014   D/c acei 09/08/2017 due to pseudocopd   . GAD (generalized anxiety disorder) 08/06/2016  . Lobar pneumonia ([place]) 08/30/2019  . Lung nodule 09/21/2014  . Lupus ([place])   . Mixed hyperlipidemia 09/18/2015  . Morbid obesity due to excess calories ([place]) c/b hbp/ dvt/PE 09/10/2019  . 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 Nutcracker esophagus   . Paresthesia of right foot 11/08/2019  . Pulmonary infiltrates 09/10/2019   In setting of bilateral PE 08/23/2019 with antiphospholipid syndrome   . Recurrent pneumonia 07/09/2020   Formatting of this note might be different from the original. 06/26/20, 03/08/20  . Snoring 07/09/2020  . Upper airway cough syndrome 01/05/2020   Onset mid Jan 2021 while on otc PPI  - max rx for gerd 01/04/2020 >>>     . Witnessed apneic spells 07/09/2020    Family History  Problem Relation Age of Onset  . Diabetes Mother   . Hyperlipidemia Mother   . Hypertension Mother   . Thyroid disease Mother        uncertain type--had surg--no cancer  . Heart disease Father 39  . Hyperlipidemia Father   . Hypertension Father   . Prostate cancer Father 23  . Lung cancer Father 44       Dx 06/18/2017  . Colon polyps Father   . Irritable bowel syndrome Father   . Diverticulitis Father   . Diabetes Sister   . Hyperlipidemia Sister   . Hypertension Sister   . Diabetes Maternal Grandmother   . Heart disease Maternal Grandmother   . Hyperlipidemia Maternal Grandmother   . Hypertension Maternal Grandmother   . Colon cancer Maternal Grandmother   . Heart disease Maternal Grandfather   . Hyperlipidemia Maternal Grandfather   . Hypertension Maternal Grandfather   . Stroke Maternal Grandfather   . Liver cancer Maternal Grandfather   . Irritable bowel syndrome  Maternal Grandfather   . Heart disease Paternal Grandmother   . Healthy Son   . Healthy Daughter   . Healthy Daughter    Past Surgical History:  Procedure Laterality Date  . BRONCHIAL WASHINGS  01/14/2021   Procedure: BRONCHIAL WASHINGS;  Surgeon: Freddi Starr, MD;  Location: [facility];  Service: Pulmonary;;  . CHOLECYSTECTOMY    . VIDEO BRONCHOSCOPY N/A 01/14/2021   Procedure: VIDEO BRONCHOSCOPY WITHOUT FLUORO;  Surgeon: Freddi Starr, MD;  Location: [facility];  Service: Pulmonary;  Laterality: N/A;   Social History   Social History Narrative   Married (03/2016)   Lives in [place], [place] (Originally from [place] )   Some family in [place] live in [place].   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   Did play minor league baseball. ([place])   Likes to hunt, fish and plays in competitive soft ball league (slow pitch)  Interest includes trips to [employer] and collecting colorful socks.   Immunization History  Administered Date(s) Administered  . Influenza Inj Mdck Quad Pf 12/13/2020  . Influenza,inj,Quad PF,6+ Mos 09/14/2016, 09/08/2017, 09/06/2019  . Influenza,inj,quad, With Preservative 09/18/2015  . PFIZER(Purple Top)SARS-COV-2 Vaccination 02/19/2020, 03/04/2020, 12/13/2020  . Td 11/11/2016  . Tdap 02/28/2019     Objective: Vital Signs: Resp 17   Ht 6\' 2"  (1.88 m)   Wt 292 lb 3.2 oz (132.5 kg)   BMI 37.52 kg/m    Physical Exam   Musculoskeletal Exam: ***  CDAI Exam: CDAI Score: -- Patient Global: --; Provider Global: -- Swollen: --; Tender: -- Joint Exam 04/17/2021   No joint exam has been documented for this visit   There is currently no information documented on the homunculus. Go to the Rheumatology activity and complete the homunculus joint exam.  Investigation: No additional findings.  Imaging: CT ANGIO HEAD W OR WO CONTRAST  Result Date: 03/20/2021 CLINICAL DATA:  Acute neuro deficit.   Speech difficulty EXAM: CT ANGIOGRAPHY HEAD AND NECK TECHNIQUE: Multidetector CT imaging of the head and neck was performed using the standard protocol during bolus administration of intravenous contrast. Multiplanar CT image reconstructions and MIPs were obtained to evaluate the vascular anatomy. Carotid stenosis measurements (when applicable) are obtained utilizing NASCET criteria, using the distal internal carotid diameter as the denominator. CONTRAST:  63mL OMNIPAQUE IOHEXOL 350 MG/ML SOLN COMPARISON:  CT head 02/25/2021.  MRI head 03/20/2021 FINDINGS: CT HEAD FINDINGS Brain: Ill-defined hypodensity in the left mid frontal lobe involving cortex and white matter. This is consistent with acute infarct which shows restricted diffusion. No associated hemorrhage No other acute infarct, hemorrhage, mass.  Ventricle size normal Vascular: Negative for hyperdense vessel Skull: Negative Sinuses: Negative Orbits: Negative Review of the MIP images confirms the above findings CTA NECK FINDINGS Aortic arch: Bovine branching arch. Mild atherosclerotic disease proximal great vessels without stenosis Right carotid system: Right vertebral artery widely patent without stenosis. Minimal atherosclerotic disease right carotid bifurcation Left carotid system: No significant left carotid stenosis. No significant atherosclerotic disease Vertebral arteries: Both vertebral arteries widely patent to the basilar without stenosis. Skeleton: No acute skeletal abnormality. Other neck: 24 mm right thyroid nodule. No enlarged lymph nodes in the neck. Upper chest: Lung apices clear bilaterally. Review of the MIP images confirms the above findings CTA HEAD FINDINGS Anterior circulation: Cavernous carotid widely patent bilaterally. Anterior and middle cerebral arteries patent bilaterally. Left M1 segment widely patent. Decreased caliber and irregularity of 2 branches in the superior division of the left MCA likely due to thrombus with recent  infarct in the left frontal lobe. This is best seen on series 17, image 25. There is distal occlusion of these vessels. Both anterior cerebral arteries widely patent. Right middle cerebral artery widely patent. Posterior circulation: Both vertebral arteries widely patent to the basilar. PICA patent bilaterally. Basilar widely patent. Superior cerebellar and posterior cerebral arteries patent without stenosis Venous sinuses: Normal venous enhancement. Anatomic variants: None Review of the MIP images confirms the above findings IMPRESSION: 1. Hypodensity left frontal lobe compatible with acute infarct as identified on MRI today. There is small caliber and irregularity of 2 branches supplying this area involving the superior division left MCA compatible with thrombus, with distal occlusion. 2. Otherwise no intracranial stenosis 3. No extracranial stenosis in the carotid or vertebral arteries. 4. Code stroke imaging results were communicated on 03/20/2021 at 3:39 pm to provider [place] via text page 5. 24 mm right thyroid  nodule. Refer to recommendations on thyroid ultrasound 01/07/2021 Electronically Signed   By: Franchot Gallo M.D.   On: 03/20/2021 15:43   CT ANGIO NECK W OR WO CONTRAST  Result Date: 03/20/2021 CLINICAL DATA:  Acute neuro deficit.  Speech difficulty EXAM: CT ANGIOGRAPHY HEAD AND NECK TECHNIQUE: Multidetector CT imaging of the head and neck was performed using the standard protocol during bolus administration of intravenous contrast. Multiplanar CT image reconstructions and MIPs were obtained to evaluate the vascular anatomy. Carotid stenosis measurements (when applicable) are obtained utilizing NASCET criteria, using the distal internal carotid diameter as the denominator. CONTRAST:  75mL OMNIPAQUE IOHEXOL 350 MG/ML SOLN COMPARISON:  CT head 02/25/2021.  MRI head 03/20/2021 FINDINGS: CT HEAD FINDINGS Brain: Ill-defined hypodensity in the left mid frontal lobe involving cortex and white matter. This  is consistent with acute infarct which shows restricted diffusion. No associated hemorrhage No other acute infarct, hemorrhage, mass.  Ventricle size normal Vascular: Negative for hyperdense vessel Skull: Negative Sinuses: Negative Orbits: Negative Review of the MIP images confirms the above findings CTA NECK FINDINGS Aortic arch: Bovine branching arch. Mild atherosclerotic disease proximal great vessels without stenosis Right carotid system: Right vertebral artery widely patent without stenosis. Minimal atherosclerotic disease right carotid bifurcation Left carotid system: No significant left carotid stenosis. No significant atherosclerotic disease Vertebral arteries: Both vertebral arteries widely patent to the basilar without stenosis. Skeleton: No acute skeletal abnormality. Other neck: 24 mm right thyroid nodule. No enlarged lymph nodes in the neck. Upper chest: Lung apices clear bilaterally. Review of the MIP images confirms the above findings CTA HEAD FINDINGS Anterior circulation: Cavernous carotid widely patent bilaterally. Anterior and middle cerebral arteries patent bilaterally. Left M1 segment widely patent. Decreased caliber and irregularity of 2 branches in the superior division of the left MCA likely due to thrombus with recent infarct in the left frontal lobe. This is best seen on series 17, image 25. There is distal occlusion of these vessels. Both anterior cerebral arteries widely patent. Right middle cerebral artery widely patent. Posterior circulation: Both vertebral arteries widely patent to the basilar. PICA patent bilaterally. Basilar widely patent. Superior cerebellar and posterior cerebral arteries patent without stenosis Venous sinuses: Normal venous enhancement. Anatomic variants: None Review of the MIP images confirms the above findings IMPRESSION: 1. Hypodensity left frontal lobe compatible with acute infarct as identified on MRI today. There is small caliber and irregularity of 2  branches supplying this area involving the superior division left MCA compatible with thrombus, with distal occlusion. 2. Otherwise no intracranial stenosis 3. No extracranial stenosis in the carotid or vertebral arteries. 4. Code stroke imaging results were communicated on 03/20/2021 at 3:39 pm to provider Rory Percy via text page 5. 24 mm right thyroid nodule. Refer to recommendations on thyroid ultrasound 01/07/2021 Electronically Signed   By: Franchot Gallo M.D.   On: 03/20/2021 15:43   MR Brain Wo Contrast (neuro protocol)  Result Date: 03/20/2021 CLINICAL DATA:  Neuro deficit, acute stroke suspected. EXAM: MRI HEAD WITHOUT CONTRAST TECHNIQUE: Multiplanar, multiecho pulse sequences of the brain and surrounding structures were obtained without intravenous contrast. COMPARISON:  CT head February 25, 2021.  MRI head September 06, 2019 FINDINGS: Brain: Acute infarct involving the left frontal cortex and operculum. Mild associated edema without mass effect. No acute hemorrhage. No hydrocephalus. Dilated perivascular spaces in the inferior basal ganglia bilaterally. No mass lesion. No midline shift. Basal cisterns are patent. No extra-axial fluid collections. Vascular: Major arterial flow voids are maintained at the skull  base. Skull and upper cervical spine: Normal marrow signal. Sinuses/Orbits: Clear sinuses.  Unremarkable orbits. Other: No mastoid effusions. IMPRESSION: Acute infarct involving the left frontal cortex and operculum. Mild associated edema without mass effect. Electronically Signed   By: Margaretha Sheffield MD   On: 03/20/2021 13:43   CT CEREBRAL PERFUSION W CONTRAST  Result Date: 03/20/2021 EXAM: CT PERFUSION BRAIN TECHNIQUE: Multiphase CT imaging of the brain was performed following IV bolus contrast injection. Subsequent parametric perfusion maps were calculated using RAPID software. CONTRAST:  40mL OMNIPAQUE IOHEXOL 350 MG/ML SOLN COMPARISON:  Same day CTA and MRI FINDINGS: CT Brain Perfusion Findings:  CBF (<30%) Volume: 30mL Perfusion (Tmax>6.0s) volume: 15ML Mismatch Volume: 55mL Infarct Core: 0 mL Infarction Location:None identified. IMPRESSION: RAPID calculates an approximately 15 mL area of mismatch in the left frontal lobe; however, given that this area is relatively similar in size/extent to the hypodensity seen on same day CTA the penumbra is favored to be less than 15 mL and probably not significant. Findings discussed with Dr. Rory Percy at 4:47 p.m. Electronically Signed   By: Margaretha Sheffield MD   On: 03/20/2021 16:54   ECHOCARDIOGRAM COMPLETE BUBBLE STUDY  Result Date: 03/22/2021    ECHOCARDIOGRAM REPORT   Patient Name:   CHRISTINE SCHIEFELBEIN Date of Exam: 03/22/2021 Medical Rec #:  [id number]           Height:       74.0 in Accession #:    [id number]          Weight:       240.0 lb Date of Birth:  11/12/1971            BSA:          2.349 m Patient Age:    20 years            BP:           135/88 mmHg Patient Gender: M                   HR:           81 bpm. Exam Location:  Inpatient Procedure: 2D Echo, Cardiac Doppler, Color Doppler and Saline Contrast Bubble            Study Indications:    TIA  History:        Patient has prior history of Echocardiogram examinations. Risk                 Factors:Hypertension.  Sonographer:    Cammy Brochure Referring Phys: [id number] ASHISH ARORA IMPRESSIONS  1. Left ventricular ejection fraction, by estimation, is 55 to 60%. The left ventricle has normal function. The left ventricle has no regional wall motion abnormalities. There is mild left ventricular hypertrophy. Left ventricular diastolic parameters are indeterminate.  2. Right ventricular systolic function is normal. The right ventricular size is normal. Tricuspid regurgitation signal is inadequate for assessing PA pressure.  3. The mitral valve is normal in structure. No evidence of mitral valve regurgitation.  4. The aortic valve is tricuspid. Aortic valve regurgitation is not visualized. No aortic stenosis  is present.  5. The inferior vena cava is normal in size with greater than 50% respiratory variability, suggesting right atrial pressure of 3 mmHg.  6. Agitated saline contrast bubble study was negative, with no evidence of any interatrial shunt. FINDINGS  Left Ventricle: Left ventricular ejection fraction, by estimation, is 55 to 60%. The left ventricle has normal function. The left  ventricle has no regional wall motion abnormalities. The left ventricular internal cavity size was normal in size. There is  mild left ventricular hypertrophy. Left ventricular diastolic parameters are indeterminate. Right Ventricle: The right ventricular size is normal. No increase in right ventricular wall thickness. Right ventricular systolic function is normal. Tricuspid regurgitation signal is inadequate for assessing PA pressure. Left Atrium: Left atrial size was normal in size. Right Atrium: Right atrial size was normal in size. Pericardium: There is no evidence of pericardial effusion. Mitral Valve: The mitral valve is normal in structure. No evidence of mitral valve regurgitation. Tricuspid Valve: The tricuspid valve is normal in structure. Tricuspid valve regurgitation is not demonstrated. Aortic Valve: The aortic valve is tricuspid. Aortic valve regurgitation is not visualized. No aortic stenosis is present. Aortic valve mean gradient measures 6.0 mmHg. Aortic valve peak gradient measures 11.7 mmHg. Aortic valve area, by VTI measures 3.08  cm. Pulmonic Valve: The pulmonic valve was grossly normal. Pulmonic valve regurgitation is not visualized. Aorta: The aortic root and ascending aorta are structurally normal, with no evidence of dilitation. Venous: The inferior vena cava is normal in size with greater than 50% respiratory variability, suggesting right atrial pressure of 3 mmHg. IAS/Shunts: No atrial level shunt detected by color flow Doppler. Agitated saline contrast was given intravenously to evaluate for intracardiac  shunting. Agitated saline contrast bubble study was negative, with no evidence of any interatrial shunt.  LEFT VENTRICLE PLAX 2D LVIDd:         4.40 cm  Diastology LVIDs:         3.10 cm  LV e' medial:    6.96 cm/s LV PW:         1.20 cm  LV E/e' medial:  10.5 LV IVS:        1.30 cm  LV e' lateral:   8.16 cm/s LVOT diam:     2.10 cm  LV E/e' lateral: 8.9 LV SV:         85 LV SV Index:   36 LVOT Area:     3.46 cm  RIGHT VENTRICLE             IVC RV Basal diam:  3.30 cm     IVC diam: 1.40 cm RV S prime:     19.90 cm/s TAPSE (M-mode): 3.1 cm LEFT ATRIUM             Index       RIGHT ATRIUM           Index LA diam:        3.60 cm 1.53 cm/m  RA Area:     15.00 cm LA Vol (A2C):   57.8 ml 24.61 ml/m RA Volume:   38.10 ml  16.22 ml/m LA Vol (A4C):   56.3 ml 23.97 ml/m LA Biplane Vol: 59.6 ml 25.38 ml/m  AORTIC VALVE AV Area (Vmax):    2.96 cm AV Area (Vmean):   3.12 cm AV Area (VTI):     3.08 cm AV Vmax:           171.00 cm/s AV Vmean:          120.000 cm/s AV VTI:            0.277 m AV Peak Grad:      11.7 mmHg AV Mean Grad:      6.0 mmHg LVOT Vmax:         146.00 cm/s LVOT Vmean:  108.000 cm/s LVOT VTI:          0.246 m LVOT/AV VTI ratio: 0.89  AORTA Ao Root diam: 3.30 cm Ao Asc diam:  3.30 cm MITRAL VALVE MV Area (PHT): 3.19 cm    SHUNTS MV Decel Time: 238 msec    Systemic VTI:  0.25 m MV E velocity: 72.80 cm/s  Systemic Diam: 2.10 cm MV A velocity: 70.70 cm/s MV E/A ratio:  1.03 Oswaldo Milian MD Electronically signed by Oswaldo Milian MD Signature Date/Time: 03/22/2021/1:42:32 PM    Final     Recent Labs: Lab Results  Component Value Date   WBC 15.8 (H) 03/24/2021   HGB 15.5 03/24/2021   PLT 267 03/24/2021   NA 139 03/23/2021   K 4.7 03/23/2021   CL 105 03/23/2021   CO2 24 03/23/2021   GLUCOSE 107 (H) 03/23/2021   BUN 16 03/23/2021   CREATININE 1.24 03/23/2021   BILITOT 0.9 03/20/2021   ALKPHOS 48 03/20/2021   AST 27 03/20/2021   ALT 42 03/20/2021   PROT 7.3 03/26/2021    ALBUMIN 4.5 03/20/2021   CALCIUM 9.2 03/23/2021   GFRAA 84 10/14/2020   QFTBGOLDPLUS Negative 01/15/2021    Speciality Comments: Plaquenil 200 mg p.o. twice daily started March 26, 2021  Procedures:  No procedures performed Allergies: Diltiazem hcl   Assessment / Plan:     Visit Diagnoses: Mixed connective tissue disease ([place]) - +ANA,+dsDNA,+RNP,+aCL,+LA. H/o CVA,PE,DVTs,ILD,arthralgias.AVISE  ILD (interstitial lung disease) ([place]) - Bilateral groundglass infiltrates on CT scan.  Mild diffusion defect on PFTs.  Followed by Dr. Erin Fulling.  APS (antiphospholipid syndrome) ([name]) - Followed by hematology.  He is on Coumadin.  High risk medication use - CellCept 1 g p.o. twice daily, Bactrim prophylaxis, prednisone 30 mg p.o. daily.  Plaquenil 200 mg p.o. twice daily started March 26, 2021.  Cerebrovascular accident (CVA), unspecified mechanism [facility]) - April 2022 with expressive aphasia.  He was started on Coumadin.  Multiple pulmonary emboli [facility]) - September 2019.  Deep vein thrombosis (DVT) of right lower extremity, unspecified chronicity, unspecified vein ([name])  Pulmonary infiltrates  Hypertensive heart disease without heart failure  Mixed hyperlipidemia  Esophageal spasm  Nutcracker esophagus  OSA on CPAP  Multinodular goiter  GAD (generalized anxiety disorder)  Orders: No orders of the defined types were placed in this encounter.  No orders of the defined types were placed in this encounter.   Face-to-face time spent with patient was *** minutes. Greater than 50% of time was spent in counseling and coordination of care.  Follow-Up Instructions: No follow-ups on file.   Earnestine Mealing, CMA  Note - This record has been created using Editor, commissioning.  Chart creation errors have been sought, but may not always  have been located. Such creation errors do not reflect on  the standard of medical care.

## 2021-04-18 ENCOUNTER — Encounter: Payer: Self-pay | Admitting: Family

## 2021-04-18 ENCOUNTER — Telehealth: Payer: Self-pay

## 2021-04-18 ENCOUNTER — Other Ambulatory Visit: Payer: Self-pay

## 2021-04-18 ENCOUNTER — Inpatient Hospital Stay (HOSPITAL_BASED_OUTPATIENT_CLINIC_OR_DEPARTMENT_OTHER): Payer: BC Managed Care – PPO | Admitting: Family

## 2021-04-18 ENCOUNTER — Inpatient Hospital Stay: Payer: BC Managed Care – PPO | Attending: Family

## 2021-04-18 VITALS — BP 121/85 | HR 105 | Temp 98.3°F | Resp 19 | Ht 74.0 in | Wt 294.1 lb

## 2021-04-18 DIAGNOSIS — J849 Interstitial pulmonary disease, unspecified: Secondary | ICD-10-CM | POA: Diagnosis not present

## 2021-04-18 DIAGNOSIS — D6859 Other primary thrombophilia: Secondary | ICD-10-CM

## 2021-04-18 DIAGNOSIS — D6861 Antiphospholipid syndrome: Secondary | ICD-10-CM

## 2021-04-18 DIAGNOSIS — Z8673 Personal history of transient ischemic attack (TIA), and cerebral infarction without residual deficits: Secondary | ICD-10-CM | POA: Insufficient documentation

## 2021-04-18 DIAGNOSIS — I2699 Other pulmonary embolism without acute cor pulmonale: Secondary | ICD-10-CM

## 2021-04-18 DIAGNOSIS — Z7901 Long term (current) use of anticoagulants: Secondary | ICD-10-CM | POA: Insufficient documentation

## 2021-04-18 DIAGNOSIS — I82409 Acute embolism and thrombosis of unspecified deep veins of unspecified lower extremity: Secondary | ICD-10-CM | POA: Diagnosis not present

## 2021-04-18 LAB — CBC WITH DIFFERENTIAL (CANCER CENTER ONLY)
Abs Immature Granulocytes: 0.19 10*3/uL — ABNORMAL HIGH (ref 0.00–0.07)
Basophils Absolute: 0.1 10*3/uL (ref 0.0–0.1)
Basophils Relative: 0 %
Eosinophils Absolute: 0.2 10*3/uL (ref 0.0–0.5)
Eosinophils Relative: 1 %
HCT: 43.4 % (ref 39.0–52.0)
Hemoglobin: 14.6 g/dL (ref 13.0–17.0)
Immature Granulocytes: 2 %
Lymphocytes Relative: 23 %
Lymphs Abs: 2.9 10*3/uL (ref 0.7–4.0)
MCH: 30.5 pg (ref 26.0–34.0)
MCHC: 33.6 g/dL (ref 30.0–36.0)
MCV: 90.8 fL (ref 80.0–100.0)
Monocytes Absolute: 1 10*3/uL (ref 0.1–1.0)
Monocytes Relative: 8 %
Neutro Abs: 8.2 10*3/uL — ABNORMAL HIGH (ref 1.7–7.7)
Neutrophils Relative %: 66 %
Platelet Count: 254 10*3/uL (ref 150–400)
RBC: 4.78 MIL/uL (ref 4.22–5.81)
RDW: 13.7 % (ref 11.5–15.5)
WBC Count: 12.5 10*3/uL — ABNORMAL HIGH (ref 4.0–10.5)
nRBC: 0 % (ref 0.0–0.2)

## 2021-04-18 LAB — CMP (CANCER CENTER ONLY)
ALT: 40 U/L (ref 0–44)
AST: 21 U/L (ref 15–41)
Albumin: 4.3 g/dL (ref 3.5–5.0)
Alkaline Phosphatase: 51 U/L (ref 38–126)
Anion gap: 10 (ref 5–15)
BUN: 31 mg/dL — ABNORMAL HIGH (ref 6–20)
CO2: 27 mmol/L (ref 22–32)
Calcium: 10 mg/dL (ref 8.9–10.3)
Chloride: 100 mmol/L (ref 98–111)
Creatinine: 1.2 mg/dL (ref 0.61–1.24)
GFR, Estimated: >60 mL/min (ref >60)
Glucose, Bld: 131 mg/dL — ABNORMAL HIGH (ref 70–99)
Potassium: 4 mmol/L (ref 3.5–5.1)
Sodium: 137 mmol/L (ref 135–145)
Total Bilirubin: 0.6 mg/dL (ref 0.3–1.2)
Total Protein: 7.3 g/dL (ref 6.5–8.1)

## 2021-04-18 LAB — D-DIMER, QUANTITATIVE: D-Dimer, Quant: 0.84 ug/mL-FEU — ABNORMAL HIGH (ref 0.00–0.50)

## 2021-04-18 NOTE — Telephone Encounter (Signed)
appts made per 5.20/22 los and pt req to view on [employer]

## 2021-04-18 NOTE — Progress Notes (Signed)
Hematology and Oncology Follow Up Visit  Wesley Hancock [id number] Apr 09, 1971 50 y.o. 04/18/2021   Principle Diagnosis:  Antiphospholipid antibody syndrome Low protein S activity Bilateral pulmonary emboli with no heart strain and right lower extremity DVT Lupus with connective tissue disease  Interstitial lung disease Ischemic stroke 02/2021  Current Therapy: Coumadin  Target INR 2.5-3.5   Interim History:  Wesley Hancock is here today for follow-up. He has been through quite a lot since we last saw him in November 2021.  He went into the ED in February with SOB and chest pain. He was formally diagnosed at that time with mixed connective tissue disease with Lupus and interstitial lung disease. He is currently on Cellcept with a prednisone taper and Plaquenil.  In April, he went to the ED with aphasia and was diagnosed with acute ischemic CVA of left frontal cortex and operculum. Thankfully, he has responded well to Coumadin and INR several days ago is stable at 3.5. He is currently on 10 mg PO daily and goes every 2 weeks now to the coumadin clinic in [place] for management.  No blood loss noted. No bruising or petechiae.  He followed up with neurology yesterday. He is currently in [facility] rehab 2 days a week and states that this has really helped with his word recall.  His SOB seems to be stable.  He finally received his CPAP and has been using for the last 3 weeks. He can tell that he is much more rested and energy has improved.  No fever, chills, n/v, cough, rash, dizziness, chest pain, palpitations, abdominal pain or changes in bowel or bladder habits.  No swelling, tenderness, numbness or tingling in his extremities at this time.  No falls or syncope to report.  He is eating well and staying hydrated throughout the day. His weight is 294 lbs.   ECOG Performance Status: 1 - Symptomatic but completely ambulatory  Medications:  Allergies as of 04/18/2021       Reactions   Diltiazem Hcl Diarrhea, Other (See Comments)   Lethargic       Medication List       Accurate as of Apr 18, 2021  8:26 AM. If you have any questions, ask your nurse or doctor.        acetaminophen 500 MG tablet Commonly known as: TYLENOL Take 1,000 mg by mouth every 6 (six) hours as needed for mild pain.   buPROPion 150 MG 24 hr tablet Commonly known as: WELLBUTRIN XL Take 1 tablet daily What changed:   how much to take  how to take this  when to take this  additional instructions   escitalopram 20 MG tablet Commonly known as: LEXAPRO TAKE 1 TABLET BY MOUTH EVERY DAY   ezetimibe 10 MG tablet Commonly known as: ZETIA Take 1 tablet (10 mg total) by mouth daily.   fluticasone 50 MCG/ACT nasal spray Commonly known as: FLONASE Place 1 spray into both nostrils daily as needed for allergies.   hydroxychloroquine 200 MG tablet Commonly known as: Plaquenil Take 1 tablet (200 mg total) by mouth 2 (two) times daily.   ibuprofen 200 MG tablet Commonly known as: ADVIL Take 400 mg by mouth every 6 (six) hours as needed for fever, headache or mild pain.   mycophenolate 500 MG tablet Commonly known as: CELLCEPT Take 2 tablets (1,000 mg total) by mouth 2 (two) times daily.   pantoprazole 40 MG tablet Commonly known as: Protonix Take 1 tablet (40 mg total) by mouth daily.  predniSONE 20 MG tablet Commonly known as: DELTASONE Take 1 tablet (20 mg total) by mouth daily. What changed: how much to take   PROBIOTIC PO Take 1 capsule by mouth daily.   rosuvastatin 40 MG tablet Commonly known as: CRESTOR Take 1 tablet (40 mg total) by mouth daily.   sulfamethoxazole-trimethoprim 400-80 MG tablet Commonly known as: BACTRIM Take 1 tablet by mouth daily.   telmisartan-hydrochlorothiazide 80-25 MG tablet Commonly known as: MICARDIS HCT TAKE 1 TABLET BY MOUTH EVERY DAY   warfarin 5 MG tablet Commonly known as: COUMADIN Take as directed by the  anticoagulation clinic. If you are unsure how to take this medication, talk to your nurse or doctor. Original instructions: Take 2 tablets daily except take 2 1/2 tablets on Mon and Fri or Take as directed by anticoagulation clinic       Allergies:  Allergies  Allergen Reactions  . Diltiazem Hcl Diarrhea and Other (See Comments)    Lethargic     Past Medical History, Surgical history, Social history, and Family History were reviewed and updated.  Review of Systems: All other 10 point review of systems is negative.   Physical Exam:  vitals were not taken for this visit.   Wt Readings from Last 3 Encounters:  04/17/21 292 lb 3.2 oz (132.5 kg)  03/26/21 290 lb (131.5 kg)  03/25/21 282 lb (127.9 kg)    Ocular: Sclerae unicteric, pupils equal, round and reactive to light Ear-nose-throat: Oropharynx clear, dentition fair Lymphatic: No cervical or supraclavicular adenopathy Lungs no rales or rhonchi, good excursion bilaterally Heart regular rate and rhythm, no murmur appreciated Abd soft, nontender, positive bowel sounds MSK no focal spinal tenderness, no joint edema Neuro: non-focal, well-oriented, appropriate affect Breasts: Deferred   Lab Results  Component Value Date   WBC 15.8 (H) 03/24/2021   HGB 15.5 03/24/2021   HCT 47.5 03/24/2021   MCV 92.6 03/24/2021   PLT 267 03/24/2021   Lab Results  Component Value Date   FERRITIN 135 12/20/2019   IRON 45 12/20/2019   TIBC 390 12/20/2019   UIBC 345 12/20/2019   IRONPCTSAT 12 (L) 12/20/2019   Lab Results  Component Value Date   RETICCTPCT 1.8 09/19/2019   RBC 5.13 03/24/2021   No results found for: Nils Pyle, [facility] Lab Results  Component Value Date   IGGSERUM 1,016 03/26/2021   IGA 444 (H) 07/15/2020   IGMSERUM 165 03/26/2021   Lab Results  Component Value Date   ALBUMINELP 4.1 03/26/2021   A1GS 0.4 (H) 03/26/2021   A2GS 0.8 03/26/2021   BETS 0.6 03/26/2021   BETA2SER 0.4 03/26/2021    GAMS 1.1 03/26/2021   SPEI  03/26/2021     Comment:     . One or more serum protein fractions are outside the normal ranges.  No abnormal protein bands are apparent. .      Chemistry      Component Value Date/Time   NA 139 03/23/2021 0258   NA 136 10/14/2020 0823   K 4.7 03/23/2021 0258   CL 105 03/23/2021 0258   CO2 24 03/23/2021 0258   BUN 16 03/23/2021 0258   BUN 19 10/14/2020 0823   CREATININE 1.24 03/23/2021 0258   CREATININE 1.33 (H) 10/21/2020 0811      Component Value Date/Time   CALCIUM 9.2 03/23/2021 0258   ALKPHOS 48 03/20/2021 1210   AST 27 03/20/2021 1210   AST 28 10/21/2020 0811   ALT 42 03/20/2021 1210   ALT 63 (  H) 10/21/2020 0811   BILITOT 0.9 03/20/2021 1210   BILITOT 0.7 10/21/2020 0811       Impression and Plan: Mr. Tokarz is a very pleasant 50yo caucasian gentleman with antiphospholipid antibody syndrome and mild protein S activity deficiency. He has history of bilateral pulmonary emboli with no heart strain and right lower extremity DVT. He was recently diagnosed with connective tissue disease with Lupus followed by an ischemic stroke.  He is now on Coumadin 10 mg PO daily and INR this week was therapeutic. He continues to follow up with the [facility] coumadin clinic for management.  Follow-up in 3 months.  He was encouraged to contact our office with any questions or concerns.   [name], NP 5/20/20228:26 AM

## 2021-04-19 LAB — PROTEIN S, TOTAL: Protein S Ag, Total: 52 % — ABNORMAL LOW (ref 60–150)

## 2021-04-19 LAB — PROTEIN S ACTIVITY: Protein S Activity: 29 % — ABNORMAL LOW (ref 63–140)

## 2021-04-20 ENCOUNTER — Other Ambulatory Visit: Payer: Self-pay

## 2021-04-21 ENCOUNTER — Ambulatory Visit: Payer: BC Managed Care – PPO

## 2021-04-21 LAB — LUPUS ANTICOAGULANT PANEL
DRVVT: 177.1 s — ABNORMAL HIGH (ref 0.0–47.0)
PTT Lupus Anticoagulant: 56.8 s — ABNORMAL HIGH (ref 0.0–51.9)

## 2021-04-21 LAB — DRVVT CONFIRM: dRVVT Confirm: 2.8 ratio — ABNORMAL HIGH (ref 0.8–1.2)

## 2021-04-21 LAB — PTT-LA MIX: PTT-LA Mix: 39.2 s (ref 0.0–48.9)

## 2021-04-21 LAB — PTT-LA INCUB MIX: PTT-LA Incub Mix: 53.4 s — ABNORMAL HIGH (ref 0.0–48.9)

## 2021-04-21 LAB — HEXAGONAL PHASE PHOSPHOLIPID: Hexagonal Phase Phospholipid: 11 s (ref 0–11)

## 2021-04-21 LAB — DRVVT MIX: dRVVT Mix: 79.8 s — ABNORMAL HIGH (ref 0.0–40.4)

## 2021-04-21 MED ORDER — ROSUVASTATIN CALCIUM 40 MG PO TABS: 40.0000 mg | ORAL_TABLET | Freq: Every day | ORAL | 0 refills | Status: DC

## 2021-04-21 NOTE — Telephone Encounter (Signed)
Dr. Jerline Pain, please advise if okay to refill Crestor 40 mg?

## 2021-04-22 ENCOUNTER — Ambulatory Visit: Payer: BC Managed Care – PPO | Admitting: Pulmonary Disease

## 2021-04-22 ENCOUNTER — Telehealth: Payer: Self-pay | Admitting: Pulmonary Disease

## 2021-04-22 ENCOUNTER — Other Ambulatory Visit: Payer: Self-pay

## 2021-04-22 ENCOUNTER — Telehealth: Payer: Self-pay

## 2021-04-22 ENCOUNTER — Encounter: Payer: Self-pay | Admitting: Pulmonary Disease

## 2021-04-22 VITALS — BP 128/80 | HR 95 | Temp 97.9°F | Ht 74.0 in | Wt 293.6 lb

## 2021-04-22 DIAGNOSIS — J849 Interstitial pulmonary disease, unspecified: Secondary | ICD-10-CM

## 2021-04-22 MED ORDER — SULFAMETHOXAZOLE-TRIMETHOPRIM 400-80 MG PO TABS: 1.0000 | ORAL_TABLET | Freq: Every day | ORAL | 0 refills | Status: DC

## 2021-04-22 MED ORDER — PREDNISONE 10 MG PO TABS: 10.0000 mg | ORAL_TABLET | Freq: Every day | ORAL | 0 refills | Status: DC

## 2021-04-22 MED ORDER — ALBUTEROL SULFATE HFA 108 (90 BASE) MCG/ACT IN AERS: 2.0000 | INHALATION_SPRAY | Freq: Four times a day (QID) | RESPIRATORY_TRACT | 6 refills | Status: AC | PRN

## 2021-04-22 MED ORDER — PREDNISONE 20 MG PO TABS: 20.0000 mg | ORAL_TABLET | Freq: Every day | ORAL | 0 refills | Status: AC

## 2021-04-22 NOTE — Progress Notes (Signed)
Synopsis: Pulmonary follow up for ILD diagnosis in 12/2020  Subjective:   PATIENT ID: Wesley Hancock GENDER: male DOB: [birth year], MRN: [id number]  HPI  Chief Complaint  Patient presents with  . Follow-up    F/U for ILD. States he has noticed an increase with SOB for the past few days especially with exertion. Is also requesting a refill on his prednisone. Slight increase in wheezing.    Wesley Hancock is a 50 year old male, former smoker with history of DVT/PE due to antiphospholipid syndrome diagnosed in September 2020 and later with SLE and mixed connective tissue disease with interstitial lung disease features in February 2022.   He was started on steroid taper in February, initially on 60mg  daily with bactrim prophylaxis. He was tapered to 40mg  daily on 01/30/21 and started on mycophenolate 500mg  twice daily at that time. The mycophenolate was increased to 1g twice daily on 03/26/21. He has tolerated the mycophenolate well and his lab values remain in normal range. He is currently on 30mg  of prednisone daily and bactrim prophylaxis. He reports noticing some increased shortness of breath over the past 3-4 days. Denies fevers or chills.   He received his CPAP machine at the beginning of the month and has noticed improvement in his day time fatigue. He has been gaining weight since being on the steroid taper.  He is followed by Dr. [name] in rheumatology and [name], NP in hematology. I have reviewed there notes from 5/19 and 5/20 respectively.  Past Medical History:  Diagnosis Date  . Acute nonintractable headache 09/06/2019  . Acute respiratory failure with hypoxia ([place]) 08/30/2019  . AKI (acute kidney injury) ([place]) 08/30/2019  . Antiphospholipid syndrome ([place]) 10/30/2019  . Anxiety   . APS (antiphospholipid syndrome) ([place])   . Arthritis   . Colon polyp    ? hyperplastic  . Depression   . Diarrhea 05/21/2020  . DVT (deep venous thrombosis) ([place])   . Dyspnea on  exertion 09/08/2017   "Quit smoking" 2015 with onset of symptoms in 2016  Spirometry 09/08/2017  Flat f/v loop  - d/c acei 09/08/2017  - 01/04/2020   Walked RA x two laps =  approx 523ft @ fast pace - stopped due to end of study/ min sob with sats of 93 % at the end of the study. - PFT's  03/08/20   FEV1 3.84 (83 % ) ratio 0..90  p 3 % improvement from saba p ? prior to study with DLCO  26.46 (77%) corrects to 4.76 (99%)    . Esophageal spasm   . Essential hypertension 09/23/2014   D/c acei 09/08/2017 due to pseudocopd   . GAD (generalized anxiety disorder) 08/06/2016  . Lobar pneumonia ([place]) 08/30/2019  . Lung nodule 09/21/2014  . Lupus ([name])   . Mixed hyperlipidemia 09/18/2015  . Morbid obesity due to excess calories ([place]) c/b hbp/ dvt/PE 09/10/2019  . 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 Nutcracker esophagus   . Paresthesia of right foot 11/08/2019  . Pulmonary infiltrates 09/10/2019   In setting of bilateral PE 08/23/2019 with antiphospholipid syndrome   . Recurrent pneumonia 07/09/2020   Formatting of this note might be different from the original. 06/26/20, 03/08/20  . Snoring 07/09/2020  . Upper airway cough syndrome 01/05/2020   Onset  mid Jan 2021 while on otc PPI  - max rx for gerd 01/04/2020 >>>     . Witnessed apneic spells 07/09/2020     Family History  Problem Relation Age of Onset  . Diabetes Mother   . Hyperlipidemia Mother   . Hypertension Mother   . Thyroid disease Mother        uncertain type--had surg--no cancer  . Heart disease Father 50  . Hyperlipidemia Father   . Hypertension Father   . Prostate cancer Father 38  . Lung cancer Father 78       Dx 06/18/2017  . Colon polyps Father   . Irritable bowel syndrome Father   . Diverticulitis Father   . Diabetes Sister   . Hyperlipidemia  Sister   . Hypertension Sister   . Diabetes Maternal Grandmother   . Heart disease Maternal Grandmother   . Hyperlipidemia Maternal Grandmother   . Hypertension Maternal Grandmother   . Colon cancer Maternal Grandmother   . Heart disease Maternal Grandfather   . Hyperlipidemia Maternal Grandfather   . Hypertension Maternal Grandfather   . Stroke Maternal Grandfather   . Liver cancer Maternal Grandfather   . Irritable bowel syndrome Maternal Grandfather   . Heart disease Paternal Grandmother   . Healthy Son   . Healthy Daughter   . Healthy Daughter      Social History   Socioeconomic History  . Marital status: Married    Spouse name: Not on file  . Number of children: 3  . Years of education: Not on file  . Highest education level: Not on file  Occupational History  . Occupation: Truck Geophysicist/field seismologist  Tobacco Use  . Smoking status: Former Smoker    Packs/day: 1.00    Years: 20.00    Pack years: 20.00    Types: Cigarettes    Quit date: 11/30/2013    Years since quitting: 7.4  . Smokeless tobacco: Former Systems developer    Types: Snuff    Quit date: 05/21/2018  Vaping Use  . Vaping Use: Never used  Substance and Sexual Activity  . Alcohol use: Not Currently  . Drug use: No  . Sexual activity: Yes  Other Topics Concern  . Not on file  Social History Narrative   Married (03/2016)   Lives in [place], [place] (Originally from [place] )   Some family in [place] live in [place].   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   Did play minor [place] baseball. ([place]   Likes to hunt, fish and plays in competitive soft ball league (slow pitch)   Interest includes trips to [employer] and collecting colorful socks.   Social Determinants of Health   Financial Resource Strain: Not on file  Food Insecurity: Not on file  Transportation Needs: Not on file  Physical Activity: Not on file  Stress: Not on file  Social Connections: Not on file  Intimate  Partner Violence: Not on file     Allergies  Allergen Reactions  . Diltiazem Hcl Diarrhea and Other (See Comments)    Lethargic      Outpatient Medications Prior to Visit  Medication Sig Dispense Refill  . acetaminophen (TYLENOL) 500 MG tablet Take 1,000 mg by mouth every 6 (six) hours as needed for mild pain.    Marland Kitchen buPROPion (WELLBUTRIN XL) 150 MG 24 hr tablet Take 1 tablet daily (Patient taking differently: Take 150 mg by mouth daily.) 90 tablet  3  . escitalopram (LEXAPRO) 20 MG tablet TAKE 1 TABLET BY MOUTH EVERY DAY (Patient taking differently: Take 20 mg by mouth daily.) 90 tablet 1  . fluticasone (FLONASE) 50 MCG/ACT nasal spray Place 1 spray into both nostrils daily as needed for allergies.    . hydroxychloroquine (PLAQUENIL) 200 MG tablet Take 1 tablet (200 mg total) by mouth 2 (two) times daily. 180 tablet 0  . mycophenolate (CELLCEPT) 500 MG tablet Take 2 tablets (1,000 mg total) by mouth 2 (two) times daily. 120 tablet 6  . pantoprazole (PROTONIX) 40 MG tablet Take 1 tablet (40 mg total) by mouth daily. 90 tablet 3  . Probiotic Product (PROBIOTIC PO) Take 1 capsule by mouth daily.    . rosuvastatin (CRESTOR) 40 MG tablet Take 1 tablet (40 mg total) by mouth daily. 30 tablet 0  . telmisartan-hydrochlorothiazide (MICARDIS HCT) 80-25 MG tablet TAKE 1 TABLET BY MOUTH EVERY DAY (Patient taking differently: Take 1 tablet by mouth daily.) 90 tablet 2  . warfarin (COUMADIN) 5 MG tablet Take 2 tablets daily except take 2 1/2 tablets on Mon and Fri or Take as directed by anticoagulation clinic 180 tablet 0  . ibuprofen (ADVIL) 200 MG tablet Take 400 mg by mouth every 6 (six) hours as needed for fever, headache or mild pain.    . predniSONE (DELTASONE) 20 MG tablet Take 1 tablet (20 mg total) by mouth daily. (Patient taking differently: Take 30 mg by mouth daily.) 60 tablet 0  . sulfamethoxazole-trimethoprim (BACTRIM) 400-80 MG tablet Take 1 tablet by mouth daily. 30 tablet 2  . ezetimibe  (ZETIA) 10 MG tablet Take 1 tablet (10 mg total) by mouth daily. 90 tablet 3   No facility-administered medications prior to visit.    Review of Systems  Constitutional: Negative for chills, fever, malaise/fatigue and weight loss.  HENT: Negative for congestion, sinus pain and sore throat.   Eyes: Negative.   Respiratory: Negative for cough, hemoptysis, sputum production, shortness of breath and wheezing.   Cardiovascular: Negative for chest pain, palpitations, orthopnea, claudication and leg swelling.  Gastrointestinal: Negative for abdominal pain, heartburn, nausea and vomiting.  Genitourinary: Negative.   Musculoskeletal: Negative for joint pain and myalgias.  Skin: Negative for rash.  Neurological: Negative for weakness.  Endo/Heme/Allergies: Negative.   Psychiatric/Behavioral: Negative.     Objective:   Vitals:   04/22/21 1158  BP: 128/80  Pulse: 95  Temp: 97.9 F (36.6 C)  TempSrc: Temporal  SpO2: 97%  Weight: 293 lb 9.6 oz (133.2 kg)  Height: 6\' 2"  (1.88 m)     Physical Exam Constitutional:      General: He is not in acute distress.    Appearance: He is obese.  HENT:     Head: Normocephalic and atraumatic.     Mouth/Throat:     Mouth: Mucous membranes are moist.     Pharynx: Oropharynx is clear.  Eyes:     Extraocular Movements: Extraocular movements intact.     Conjunctiva/sclera: Conjunctivae normal.     Pupils: Pupils are equal, round, and reactive to light.  Cardiovascular:     Rate and Rhythm: Normal rate and regular rhythm.     Pulses: Normal pulses.     Heart sounds: Normal heart sounds. No murmur heard.   Pulmonary:     Effort: Pulmonary effort is normal.     Breath sounds: Rales (left base) present. No wheezing or rhonchi.  Abdominal:     General: Bowel sounds are normal.  Palpations: Abdomen is soft.  Musculoskeletal:        General: No swelling or tenderness.     Right lower leg: No edema.     Left lower leg: No edema.   Lymphadenopathy:     Cervical: No cervical adenopathy.  Skin:    General: Skin is warm and dry.  Neurological:     General: No focal deficit present.     Mental Status: He is alert.  Psychiatric:        Mood and Affect: Mood normal.        Behavior: Behavior normal.        Thought Content: Thought content normal.        Judgment: Judgment normal.    CBC    Component Value Date/Time   WBC 12.5 (H) 04/18/2021 0817   WBC 15.8 (H) 03/24/2021 0303   RBC 4.78 04/18/2021 0817   HGB 14.6 04/18/2021 0817   HCT 43.4 04/18/2021 0817   PLT 254 04/18/2021 0817   MCV 90.8 04/18/2021 0817   MCH 30.5 04/18/2021 0817   MCHC 33.6 04/18/2021 0817   RDW 13.7 04/18/2021 0817   LYMPHSABS 2.9 04/18/2021 0817   MONOABS 1.0 04/18/2021 0817   EOSABS 0.2 04/18/2021 0817   BASOSABS 0.1 04/18/2021 0817   BMP Latest Ref Rng & Units 04/18/2021 03/23/2021 03/20/2021  Glucose 70 - 99 mg/dL 131(H) 107(H) 156(H)  BUN 6 - 20 mg/dL 31(H) 16 28(H)  Creatinine 0.61 - 1.24 mg/dL 1.20 1.24 1.17  BUN/Creat Ratio 9 - 20 - - -  Sodium 135 - 145 mmol/L 137 139 135  Potassium 3.5 - 5.1 mmol/L 4.0 4.7 4.2  Chloride 98 - 111 mmol/L 100 105 99  CO2 22 - 32 mmol/L 27 24 26   Calcium 8.9 - 10.3 mg/dL 10.0 9.2 9.7   Labs: 01/13/2021 ANA Positive dsDNA 177 IU/mL (0-9IU/mL) Ribnucleic Protien 2.4 (0- 0.9AI)  01/15/21 MyoMarker 3 Panel is negative  Chest imaging: CTA Chest 01/13/21 1. No demonstrable pulmonary embolus. No thoracic aortic aneurysm or dissection. There are foci of aortic atherosclerosis as well as foci of coronary artery calcification.  2. Extensive airspace opacity bilaterally with overall increased compared to December 2021. Areas of patchy consolidation noted in several areas. The overall appearance is most indicative of atypical organism pneumonia. A degree of bacterial superinfection is question. Note that there has been persistence of multifocal airspace opacity for several months. This  circumstance may warrant Pulmonary Medicine consultation with consideration for bronchoscopy for further assessment with respect to etiology for the parenchymal lung lesions.  3. Stable adenopathy of uncertain etiology. Given the parenchymal lung changes, this adenopathy could have reactive etiology.  4. Hepatic steatosis. Liver appears prominent although incompletely visualized. Gallbladder absent.  5. Thyroid nodules with assessment by thyroid ultrasound 6 days prior. Please see recent thyroid ultrasound report with respect to thyroid nodular assessment.  PFT: PFT Results Latest Ref Rng & Units 03/25/2021 03/08/2020  FVC-Pre L 4.17 4.31  FVC-Predicted Pre % 70 72  FVC-Post L 4.02 4.29  FVC-Predicted Post % 68 72  Pre FEV1/FVC % % 88 86  Post FEV1/FCV % % 91 90  FEV1-Pre L 3.66 3.71  FEV1-Predicted Pre % 79 80  FEV1-Post L 3.67 3.84  DLCO uncorrected ml/min/mmHg 26.04 26.46  DLCO UNC% % 76 77  DLCO corrected ml/min/mmHg 25.42 26.46  DLCO COR %Predicted % 74 77  DLVA Predicted % 96 99  TLC L 6.11 5.98  TLC % Predicted % 77 76  RV % Predicted % 74 67  03/25/21 mild restrictive defect and mild diffusion defect  Echo 07/31/20: 1. Left ventricular ejection fraction, by estimation, is 60 to 65%. The  left ventricle has normal function. The left ventricle has no regional  wall motion abnormalities. There is mild left ventricular hypertrophy.  Left ventricular diastolic parameters  were normal.  2. Right ventricular systolic function is normal. The right ventricular  size is normal.  3. Left atrial size was mildly dilated.  4. The mitral valve is normal in structure. No evidence of mitral valve  regurgitation. No evidence of mitral stenosis.  5. The aortic valve is normal in structure. Aortic valve regurgitation is  not visualized. No aortic stenosis is present.  6. The inferior vena cava is normal in size with greater than 50%  respiratory variability, suggesting right  atrial pressure of 3 mmHg.  NM Myocardial Stress Test 08/01/20  The left ventricular ejection fraction is normal (55-65%).  Nuclear stress EF: 59%. There were no wall motion abnormalities  There was no ST segment deviation noted during stress.  The study is normal.  This is a low risk study. No perfusion defects.  Assessment & Plan:   ILD (interstitial lung disease) ([place]) - Plan: albuterol (VENTOLIN HFA) 108 (90 Base) MCG/ACT inhaler, predniSONE (DELTASONE) 20 MG tablet, predniSONE (DELTASONE) 10 MG tablet, sulfamethoxazole-trimethoprim (BACTRIM) 400-80 MG tablet  Discussion: Theordore Cisnero is a 50 year old male, former smoker with history of DVT/PE due to antiphospholipid syndrome diagnosed in September 2020 who presented with shortness of breath and chest pain 01/13/21 with workup showing signs of interstitial lung disease related to lupus/mixed connective tissue overlap syndrome.   He was treated with IV solumedrol in the hospital for 2 days and then transitioned to 60mg  of prednisone daily along with bactrim prophylaxis which was tapered to 40mg  daily on 01/30/21 with initiation of 500mg  of mycophenolate. He was then tapered to 30mg  daily prednisone on 03/25/21 and an increase in the mycophenolate to 1g BID. We will further taper his steroids to 20mg  daily today. He is to remain on bactrim prophylaxis over the next 2 weeks and then stop. We will then taper his steroids further over the next several weeks as outlined in the patient instructions. He is to monitor for any worsening in his respiratory symptoms.     He is to follow up in 1 month and we will monitor labs at that time.   We will plan for a HRCT chest in August or September.  Freda Jackson, MD [facility] Office: [phone]   Current Outpatient Medications:  .  acetaminophen (TYLENOL) 500 MG tablet, Take 1,000 mg by mouth every 6 (six) hours as needed for mild pain., Disp: , Rfl:  .  albuterol  (VENTOLIN HFA) 108 (90 Base) MCG/ACT inhaler, Inhale 2 puffs into the lungs every 6 (six) hours as needed for wheezing or shortness of breath., Disp: 8 g, Rfl: 6 .  buPROPion (WELLBUTRIN XL) 150 MG 24 hr tablet, Take 1 tablet daily (Patient taking differently: Take 150 mg by mouth daily.), Disp: 90 tablet, Rfl: 3 .  escitalopram (LEXAPRO) 20 MG tablet, TAKE 1 TABLET BY MOUTH EVERY DAY (Patient taking differently: Take 20 mg by mouth daily.), Disp: 90 tablet, Rfl: 1 .  fluticasone (FLONASE) 50 MCG/ACT nasal spray, Place 1 spray into both nostrils daily as needed for allergies., Disp: , Rfl:  .  hydroxychloroquine (PLAQUENIL) 200 MG tablet, Take 1 tablet (200 mg total)  by mouth 2 (two) times daily., Disp: 180 tablet, Rfl: 0 .  mycophenolate (CELLCEPT) 500 MG tablet, Take 2 tablets (1,000 mg total) by mouth 2 (two) times daily., Disp: 120 tablet, Rfl: 6 .  pantoprazole (PROTONIX) 40 MG tablet, Take 1 tablet (40 mg total) by mouth daily., Disp: 90 tablet, Rfl: 3 .  predniSONE (DELTASONE) 10 MG tablet, Take 1 tablet (10 mg total) by mouth daily with breakfast., Disp: 21 tablet, Rfl: 0 .  Probiotic Product (PROBIOTIC PO), Take 1 capsule by mouth daily., Disp: , Rfl:  .  rosuvastatin (CRESTOR) 40 MG tablet, Take 1 tablet (40 mg total) by mouth daily., Disp: 30 tablet, Rfl: 0 .  telmisartan-hydrochlorothiazide (MICARDIS HCT) 80-25 MG tablet, TAKE 1 TABLET BY MOUTH EVERY DAY (Patient taking differently: Take 1 tablet by mouth daily.), Disp: 90 tablet, Rfl: 2 .  warfarin (COUMADIN) 5 MG tablet, Take 2 tablets daily except take 2 1/2 tablets on Mon and Fri or Take as directed by anticoagulation clinic, Disp: 180 tablet, Rfl: 0 .  ezetimibe (ZETIA) 10 MG tablet, Take 1 tablet (10 mg total) by mouth daily., Disp: 90 tablet, Rfl: 3 .  predniSONE (DELTASONE) 20 MG tablet, Take 1 tablet (20 mg total) by mouth daily., Disp: 14 tablet, Rfl: 0 .  sulfamethoxazole-trimethoprim (BACTRIM) 400-80 MG tablet, Take 1 tablet by  mouth daily., Disp: 14 tablet, Rfl: 0

## 2021-04-22 NOTE — Telephone Encounter (Signed)
Called and spoke with pt and he stated that he is coming in today at 7 to see JD so he said to wait until his appt and he will get JD to send in the prednisone.  Nothing further is needed.

## 2021-04-22 NOTE — Telephone Encounter (Signed)
Patient has been scheduled

## 2021-04-22 NOTE — Patient Instructions (Addendum)
Start 20mg  of prednisone daily 5/25 to 6/8, then stop bactrim  Then take 10mg  of prednisone 6/9 to 6/23  Then take 5mg  of prednisone 6/24 to 7/7, then stop prednisone  Try albuterol inhaler as needed for shortness of breath

## 2021-04-22 NOTE — Telephone Encounter (Signed)
Please call pt and schedule for next week with Dr. Jerline Pain.

## 2021-04-22 NOTE — Telephone Encounter (Signed)
Patient called in stating he was recently in the hospital back in April for a stroke and is wanting a follow up. No available appointments with anyone, please advise where to schedule.

## 2021-04-23 ENCOUNTER — Encounter: Payer: Self-pay | Admitting: Pulmonary Disease

## 2021-04-23 DIAGNOSIS — M351 Other overlap syndromes: Secondary | ICD-10-CM | POA: Diagnosis not present

## 2021-04-23 DIAGNOSIS — J849 Interstitial pulmonary disease, unspecified: Secondary | ICD-10-CM | POA: Diagnosis not present

## 2021-04-24 ENCOUNTER — Ambulatory Visit: Payer: BC Managed Care – PPO

## 2021-04-24 ENCOUNTER — Telehealth: Payer: Self-pay

## 2021-04-24 NOTE — Telephone Encounter (Signed)
Called pt with number provided in chart, due to 2 no-show ST sessions this week today. No answer, so left message on pt's voicemail. SLP provided next scheduled appointment time and provided information about clinic no-show policy. Informed that one more no-show would result in remaining appointments being cancelled. Instructed patient return call to front office if patient desires to make change re: scheduling.

## 2021-04-24 NOTE — Telephone Encounter (Signed)
MEDICATION:   buPROPion (WELLBUTRIN XL) 150 MG 24 hr tablet  escitalopram (LEXAPRO) 20 MG tablet  PHARMACY:  [id number] - [place], [place] - [address] [place] [address] Phone:  [phone]  Fax:  [phone]       Comments: Patient needs 90 days instead of 30 for insurance purposes.             **Let patient know to contact pharmacy at the end of the day to make sure medication is ready. **  ** Please notify patient to allow 48-72 hours to process**  **Encourage patient to contact the pharmacy for refills or they can request refills through [facility]**

## 2021-04-25 MED ORDER — ESCITALOPRAM OXALATE 20 MG PO TABS
1.0000 | ORAL_TABLET | Freq: Every day | ORAL | 1 refills | Status: DC
Start: 2021-04-25 — End: 2022-02-05

## 2021-04-25 MED ORDER — BUPROPION HCL ER (XL) 150 MG PO TB24: 150.0000 mg | ORAL_TABLET | Freq: Every day | ORAL | 1 refills | Status: DC

## 2021-04-25 NOTE — Telephone Encounter (Signed)
Rx's sent to pharmacy as requested. 

## 2021-04-26 DIAGNOSIS — G4733 Obstructive sleep apnea (adult) (pediatric): Secondary | ICD-10-CM | POA: Diagnosis not present

## 2021-04-29 ENCOUNTER — Ambulatory Visit: Payer: BC Managed Care – PPO

## 2021-04-29 ENCOUNTER — Ambulatory Visit: Payer: BC Managed Care – PPO | Admitting: Family Medicine

## 2021-04-29 ENCOUNTER — Encounter: Payer: Self-pay | Admitting: Family Medicine

## 2021-04-29 ENCOUNTER — Other Ambulatory Visit: Payer: Self-pay

## 2021-04-29 VITALS — BP 113/74 | HR 84 | Temp 97.9°F | Ht 74.0 in | Wt 292.0 lb

## 2021-04-29 DIAGNOSIS — G4733 Obstructive sleep apnea (adult) (pediatric): Secondary | ICD-10-CM | POA: Diagnosis not present

## 2021-04-29 DIAGNOSIS — I2699 Other pulmonary embolism without acute cor pulmonale: Secondary | ICD-10-CM | POA: Diagnosis not present

## 2021-04-29 DIAGNOSIS — E785 Hyperlipidemia, unspecified: Secondary | ICD-10-CM

## 2021-04-29 DIAGNOSIS — Z9989 Dependence on other enabling machines and devices: Secondary | ICD-10-CM

## 2021-04-29 DIAGNOSIS — I639 Cerebral infarction, unspecified: Secondary | ICD-10-CM

## 2021-04-29 DIAGNOSIS — I69320 Aphasia following cerebral infarction: Secondary | ICD-10-CM | POA: Diagnosis not present

## 2021-04-29 DIAGNOSIS — R739 Hyperglycemia, unspecified: Secondary | ICD-10-CM

## 2021-04-29 MED ORDER — ROSUVASTATIN CALCIUM 40 MG PO TABS: 40.0000 mg | ORAL_TABLET | Freq: Every day | ORAL | 3 refills | Status: DC

## 2021-04-29 NOTE — Assessment & Plan Note (Signed)
Secondary to hypercoagulability.  He is on warfarin.

## 2021-04-29 NOTE — Assessment & Plan Note (Signed)
Last LDL was 234 during his hospitalization during stroke.  He was started on Crestor 40 mg daily.  We will recheck his lipid panel in 3 to 6 months.  Goal LDL less than 70.

## 2021-04-29 NOTE — Assessment & Plan Note (Signed)
Doing well with CPAP. 

## 2021-04-29 NOTE — Progress Notes (Signed)
   Wesley Hancock is a 50 y.o. male who presents today for an office visit.  Assessment/Plan:  Chronic Problems Addressed Today: CVA (cerebral vascular accident) ([place]) Still has very slight amount of aphasia but seems to be improving.  He is anticoagulated on warfarin.  Recently had Crestor dosed increased to 40 mg daily.  His blood pressure is at goal today.  He is following with neurology.  We will need to recheck lipids in about 3 to 6 months.  OSA on CPAP Doing well with CPAP.  Multiple pulmonary emboli ([name]) Secondary to hypercoagulability.  He is on warfarin.  Dyslipidemia Last LDL was 234 during his hospitalization during stroke.  He was started on Crestor 40 mg daily.  We will recheck his lipid panel in 3 to 6 months.  Goal LDL less than 70.  Hyperglycemia A1c 6.0 during hospitalization.  He is on prednisone which is likely contributing.  He is weaning off his prednisone per pulmonology. We will recheck again in 3 to 6 months.     Subjective:  HPI:  Patient here for follow-up.  He was hospitalized on 03/20/2021 with acute stroke.  He was discharged home on 03/24/2021.  He has history of antiphospholipid syndrome and hypercoagulability.  He was discharged home on warfarin with goal INR of 2.5-3.5.  Since his last visit he has been following with neuro rehab and neurology.  He has also had visits with his oncologist, rheumatologist, and pulmonologist.  Since being home he has done well. Still has a very small amount of aphasia but seems to be improving.  Rehab seems to be helping.  He has been using CPAP machine which seems to be working very well.  He has tolerated his medication changes well.  See A/p for status of chronic conditions.        Objective:  Physical Exam: BP 113/74   Pulse 84   Temp 97.9 F (36.6 C) (Temporal)   Ht 6\' 2"  (1.88 m)   Wt 292 lb (132.5 kg)   SpO2 99%   BMI 37.49 kg/m   Gen: No acute distress, resting comfortablyCV: Regular rate and rhythm  with no murmurs appreciated Pulm: Normal work of breathing, clear to auscultation bilaterally with no crackles, wheezes, or rhonchi Neuro: Grossly normal, moves all extremities Psych: Normal affect and thought content  Time Spent: 45 minutes of total time was spent on the date of the encounter performing the following actions: chart review prior to seeing the patient including his recent hospitalization and visits with his specialists, obtaining history, performing a medically necessary exam, counseling on the treatment plan, placing orders, and documenting in our EHR.        Algis Greenhouse. Jerline Pain, MD 04/29/2021 11:53 AM

## 2021-04-29 NOTE — Patient Instructions (Signed)
It was very nice to see you today!  I am glad that you are doing better.  Please come back in 3-6 months to recheck your cholesterol and blood sugar.   Take care, Dr Jerline Pain  PLEASE NOTE:  If you had any lab tests please let us know if you have not heard back within a few days. You may see your results on mychart before we have a chance to review them but we will give you a call once they are reviewed by [place]. If we ordered any referrals today, please let us know if you have not heard from their office within the next week.   Please try these tips to maintain a healthy lifestyle:   Eat at least 3 REAL meals and 1-2 snacks per day.  Aim for no more than 5 hours between eating.  If you eat breakfast, please do so within one hour of getting up.    Each meal should contain half fruits/vegetables, one quarter protein, and one quarter carbs (no bigger than a computer mouse)   Cut down on sweet beverages. This includes juice, soda, and sweet tea.     Drink at least 1 glass of water with each meal and aim for at least 8 glasses per day   Exercise at least 150 minutes every week.

## 2021-04-29 NOTE — Assessment & Plan Note (Signed)
A1c 6.0 during hospitalization.  He is on prednisone which is likely contributing.  He is weaning off his prednisone per pulmonology. We will recheck again in 3 to 6 months.

## 2021-04-29 NOTE — Assessment & Plan Note (Signed)
Still has very slight amount of aphasia but seems to be improving.  He is anticoagulated on warfarin.  Recently had Crestor dosed increased to 40 mg daily.  His blood pressure is at goal today.  He is following with neurology.  We will need to recheck lipids in about 3 to 6 months.

## 2021-04-30 ENCOUNTER — Ambulatory Visit (INDEPENDENT_AMBULATORY_CARE_PROVIDER_SITE_OTHER): Payer: BC Managed Care – PPO | Admitting: General Practice

## 2021-04-30 DIAGNOSIS — Z7901 Long term (current) use of anticoagulants: Secondary | ICD-10-CM | POA: Diagnosis not present

## 2021-04-30 LAB — POCT INR: INR: 1.6 — AB (ref 2.0–3.0)

## 2021-04-30 NOTE — Patient Instructions (Addendum)
Pre visit review using our clinic review tool, if applicable. No additional management support is needed unless otherwise documented below in the visit note.  Take 4 tablets (20 mg) today and then change dosage and take 2 tablets daily (10 mg) except take 3 tablets on Monday and Friday and Re-check on 6/15. *Patient is taking 20 mg of prednisone for 14 days. *Bactrim will end on 6/7.

## 2021-04-30 NOTE — Progress Notes (Signed)
I have reviewed and agree with this plan   Min Collymore, NP  

## 2021-05-01 ENCOUNTER — Ambulatory Visit: Payer: BC Managed Care – PPO

## 2021-05-05 ENCOUNTER — Ambulatory Visit: Payer: BC Managed Care – PPO

## 2021-05-07 NOTE — Therapy (Signed)
[facility] [address] [place], [place], [address] Phone: [phone]   Fax:  [phone]  Patient Details  Name: Wesley Hancock MRN: [id number] Date of Birth: 1971/11/12 Referring Provider:  Tawni Millers, MD  Doc/PCP- Inda Coke   Encounter Date: 05/07/2021   SPEECH THERAPY DISCHARGE SUMMARY  Visits from Start of Care: 3  Current functional level related to goals / functional outcomes: [name] did not return for ST intervention targeting word finding and reading comprehension s/p CVA. Pt exhibited improvements in word finding and reading comprehension with use of targeted strategies; however, pt had not yet met targeted ST goals. Pt requested to cancel all ST appointments due to scheduling conflicts and elected not to return for ST intervention.   Remaining deficits: Suspect persistent high level comprehension deficits      Education / Equipment: Anomia compensations, reading comprehension strategies, HEP  Plan: Patient agrees to discharge.  Patient goals were partially met. Patient is being discharged due to not returning since the last visit.  ?????       SLP Short Term Goals - 04/11/21 1218              SLP SHORT TERM GOAL #1    Title Pt will complete HEP with rare min A over 3 sessions     Baseline 04-11-21     Time 3     Period Weeks     Status On-going          SLP SHORT TERM GOAL #2    Title Pt will utilize speech compensations in 15 minute mod complex conversations with rare min A over 2 sessions     Time 3     Period Weeks     Status On-going          SLP SHORT TERM GOAL #3    Title Pt will demo awareness of need to reduce speech/processing speed for 4/5 opportunities in conversation with occasional min A over 2 sessions     Baseline 04-01-21     Time 3     Period Weeks     Status On-going          SLP SHORT TERM GOAL #4    Title Pt will answer  (yes/no/WH-) questions regarding paragraph length information with 80% accuracy given rare min A for comprehension over 2 sessions     Baseline 04-01-21     Time 3     Period Weeks     Status On-going                  SLP Long Term Goals - 04/11/21 1219              SLP LONG TERM GOAL #1    Title Pt will utilize speech compensations in 30 minute mod complex to complex conversation with rare min A over 2 sessions     Time 7     Period Weeks     Status On-going          SLP LONG TERM GOAL #2    Title Pt will demo awareness of need to reduce speech/processing speed for 8/10 opportunities in conversation with rare min A over 2 sessions     Time 7     Period Weeks     Status On-going          SLP LONG TERM GOAL #3    Title Pt will comprehend mod complex written communication related  to medical information or topics of personal interst with rare min A over 2 sessions     Time 7     Period Weeks     Status On-going          SLP LONG TERM GOAL #4    Title Pt will report less frustration and improved communication effectiveness via QOL by last ST session     Time 7     Period Weeks     Status On-going           Alinda Deem, MA [place] 05/07/2021, 4:11 PM  [facility] [address] [place], [place], [address] Phone: [phone]   Fax:  [phone]

## 2021-05-12 ENCOUNTER — Other Ambulatory Visit: Payer: Self-pay | Admitting: Pulmonary Disease

## 2021-05-12 DIAGNOSIS — J849 Interstitial pulmonary disease, unspecified: Secondary | ICD-10-CM

## 2021-05-14 ENCOUNTER — Other Ambulatory Visit: Payer: Self-pay

## 2021-05-14 ENCOUNTER — Ambulatory Visit (INDEPENDENT_AMBULATORY_CARE_PROVIDER_SITE_OTHER): Payer: BC Managed Care – PPO | Admitting: General Practice

## 2021-05-14 DIAGNOSIS — Z7901 Long term (current) use of anticoagulants: Secondary | ICD-10-CM | POA: Diagnosis not present

## 2021-05-14 LAB — POCT INR: INR: 5.3 — AB (ref 2.0–3.0)

## 2021-05-14 NOTE — Patient Instructions (Addendum)
Pre visit review using our clinic review tool, if applicable. No additional management support is needed unless otherwise documented below in the visit note.  Hold dosage today and tomorrow  and then return to 10 mg daily.  *Patient is taking 10 mg of prednisone for 14 days. *Bactrim will end on 6/15 (today).

## 2021-05-21 ENCOUNTER — Other Ambulatory Visit: Payer: Self-pay | Admitting: Pulmonary Disease

## 2021-05-21 ENCOUNTER — Other Ambulatory Visit: Payer: Self-pay

## 2021-05-21 ENCOUNTER — Ambulatory Visit (INDEPENDENT_AMBULATORY_CARE_PROVIDER_SITE_OTHER): Payer: BC Managed Care – PPO | Admitting: General Practice

## 2021-05-21 DIAGNOSIS — J849 Interstitial pulmonary disease, unspecified: Secondary | ICD-10-CM

## 2021-05-21 DIAGNOSIS — Z7901 Long term (current) use of anticoagulants: Secondary | ICD-10-CM

## 2021-05-21 LAB — POCT INR: INR: 1.8 — AB (ref 2.0–3.0)

## 2021-05-21 NOTE — Patient Instructions (Signed)
Pre visit review using our clinic review tool, if applicable. No additional management support is needed unless otherwise documented below in the visit note.  Take 3 tablets (15 mg) today and tomorrow and then take 2 tablets every day except 3 tablets on Monday and Thursdays.  Re-check in 2 weeks.    *Patient is taking 10 mg of prednisone for 14 days. *Bactrim dc'd

## 2021-05-27 ENCOUNTER — Encounter: Payer: Self-pay | Admitting: Pulmonary Disease

## 2021-05-27 ENCOUNTER — Other Ambulatory Visit: Payer: Self-pay

## 2021-05-27 ENCOUNTER — Telehealth: Payer: Self-pay | Admitting: Pulmonary Disease

## 2021-05-27 ENCOUNTER — Ambulatory Visit (INDEPENDENT_AMBULATORY_CARE_PROVIDER_SITE_OTHER): Payer: BC Managed Care – PPO | Admitting: Pulmonary Disease

## 2021-05-27 VITALS — BP 120/78 | HR 87 | Temp 98.7°F | Ht 74.0 in | Wt 296.4 lb

## 2021-05-27 DIAGNOSIS — M351 Other overlap syndromes: Secondary | ICD-10-CM

## 2021-05-27 DIAGNOSIS — Z5181 Encounter for therapeutic drug level monitoring: Secondary | ICD-10-CM | POA: Diagnosis not present

## 2021-05-27 DIAGNOSIS — G4733 Obstructive sleep apnea (adult) (pediatric): Secondary | ICD-10-CM | POA: Diagnosis not present

## 2021-05-27 DIAGNOSIS — J849 Interstitial pulmonary disease, unspecified: Secondary | ICD-10-CM | POA: Diagnosis not present

## 2021-05-27 LAB — CBC WITH DIFFERENTIAL/PLATELET
Basophils Absolute: 0 10*3/uL (ref 0.0–0.1)
Basophils Relative: 0.4 % (ref 0.0–3.0)
Eosinophils Absolute: 0.1 10*3/uL (ref 0.0–0.7)
Eosinophils Relative: 0.6 % (ref 0.0–5.0)
HCT: 43.5 % (ref 39.0–52.0)
Hemoglobin: 14.8 g/dL (ref 13.0–17.0)
Lymphocytes Relative: 9.3 % — ABNORMAL LOW (ref 12.0–46.0)
Lymphs Abs: 0.9 10*3/uL (ref 0.7–4.0)
MCHC: 33.9 g/dL (ref 30.0–36.0)
MCV: 88.3 fl (ref 78.0–100.0)
Monocytes Absolute: 0.8 10*3/uL (ref 0.1–1.0)
Monocytes Relative: 8.2 % (ref 3.0–12.0)
Neutro Abs: 7.7 10*3/uL (ref 1.4–7.7)
Neutrophils Relative %: 81.5 % — ABNORMAL HIGH (ref 43.0–77.0)
Platelets: 317 10*3/uL (ref 150.0–400.0)
RBC: 4.93 Mil/uL (ref 4.22–5.81)
RDW: 13.9 % (ref 11.5–15.5)
WBC: 9.5 10*3/uL (ref 4.0–10.5)

## 2021-05-27 LAB — COMPREHENSIVE METABOLIC PANEL
ALT: 30 U/L (ref 0–53)
AST: 22 U/L (ref 0–37)
Albumin: 4.4 g/dL (ref 3.5–5.2)
Alkaline Phosphatase: 61 U/L (ref 39–117)
BUN: 14 mg/dL (ref 6–23)
CO2: 30 mEq/L (ref 19–32)
Calcium: 10 mg/dL (ref 8.4–10.5)
Chloride: 99 mEq/L (ref 96–112)
Creatinine, Ser: 1.24 mg/dL (ref 0.40–1.50)
GFR: 68.44 mL/min (ref >60.00)
Glucose, Bld: 102 mg/dL — ABNORMAL HIGH (ref 70–99)
Potassium: 3.9 mEq/L (ref 3.5–5.1)
Sodium: 136 mEq/L (ref 135–145)
Total Bilirubin: 0.7 mg/dL (ref 0.2–1.2)
Total Protein: 7.7 g/dL (ref 6.0–8.3)

## 2021-05-27 MED ORDER — MYCOPHENOLATE MOFETIL 500 MG PO TABS
1000.0000 mg | ORAL_TABLET | Freq: Two times a day (BID) | ORAL | 3 refills | Status: DC
Start: 2021-05-27 — End: 2021-10-28

## 2021-05-27 NOTE — Patient Instructions (Signed)
Continue cellcept 2 tabs twice daily  We will check lab work today for the cellcept  Continue to follow the steroid taper.   We will check a CT Chest in early September and have a follow up visit soon after.

## 2021-05-27 NOTE — Progress Notes (Signed)
Synopsis: Pulmonary follow up for ILD diagnosis in 12/2020  Subjective:   PATIENT ID: Wesley Hancock GENDER: male DOB: 18-Nov-1971, MRN: [id number]  HPI  Chief Complaint  Patient presents with   Follow-up      1 month for ILD.    [name] is a 50 year old male, former smoker with history of DVT/PE due to antiphospholipid syndrome diagnosed in September 2020 and later with SLE and mixed connective tissue disease with interstitial lung disease features in February 2022.   He continues on 1g cellcept twice daily along with hydroxychloroquine for the mixed connective tissue disease and lupus. He has tapered down to 50m of prednisone daily without any increase in dyspnea. He has noticed an increase in intermittent back pain though. He had an episode of acute dyspnea over the last weekend with SpO2 into the low 90s. He rested for a couple days and then the episode resolved spontaneously. He denies any cough or wheezing. No skin rashes.   OV 04/22/21 He was started on steroid taper in February, initially on 646mdaily with bactrim prophylaxis. He was tapered to 4044maily on 01/30/21 and started on mycophenolate 500m70mice daily at that time. The mycophenolate was increased to 1g twice daily on 03/26/21. He has tolerated the mycophenolate well and his lab values remain in normal range. He is currently on 30mg61mprednisone daily and bactrim prophylaxis. He reports noticing some increased shortness of breath over the past 3-4 days. Denies fevers or chills.   He received his CPAP machine at the beginning of the month and has noticed improvement in his day time fatigue. He has been gaining weight since being on the steroid taper.  He is followed by Dr. DevesEstanislado Pandyheumatology and Sarah[facility]verna Peacein hematology. I have reviewed there notes from 5/19 and 5/20 respectively.  Past Medical History:  Diagnosis Date   Acute nonintractable headache 09/06/2019   Acute respiratory failure  with hypoxia ([name]) [place]0/2020   AKI (acute kidney injury) ([name]) [place]0/2020   Antiphospholipid syndrome ([name]) [place]30/2020   Anxiety    APS (antiphospholipid syndrome) ([name]) [place]    Colon polyp    ? hyperplastic   Depression    Diarrhea 05/21/2020   DVT (deep venous thrombosis) ([name])    Dyspnea on exertion 09/08/2017   "Quit smoking" 2015 with onset of symptoms in 2016  Spirometry 09/08/2017  Flat f/v loop  - d/c acei 09/08/2017  - 01/04/2020   Walked RA x two laps =  approx 500ft 9fst pace - stopped due to end of study/ min sob with sats of 93 % at the end of the study. - PFT's  03/08/20   FEV1 3.84 (83 % ) ratio 0..90  p 3 % improvement from saba p ? 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 06/26/20, 03/08/20   Snoring 07/09/2020   Upper airway cough syndrome 01/05/2020   Onset mid Jan 2021 while on otc PPI  - max rx for gerd 01/04/2020 >>>      Witnessed apneic spells 07/09/2020     Family History  Problem Relation Age of Onset   Diabetes Mother    Hyperlipidemia Mother    Hypertension Mother    Thyroid disease Mother        uncertain type--had surg--no  cancer   Heart disease Father 26   Hyperlipidemia Father    Hypertension Father    Prostate cancer Father 66   Lung cancer Father 52       Dx 06/18/2017   Colon polyps Father    Irritable bowel syndrome Father    Diverticulitis Father    Diabetes Sister    Hyperlipidemia Sister    Hypertension Sister    Diabetes Maternal Grandmother    Heart disease Maternal Grandmother    Hyperlipidemia Maternal Grandmother    Hypertension Maternal Grandmother    Colon cancer Maternal Grandmother    Heart disease Maternal Grandfather    Hyperlipidemia Maternal Grandfather    Hypertension Maternal Grandfather    Stroke Maternal Grandfather    Liver cancer Maternal Grandfather    Irritable bowel syndrome Maternal Grandfather    Heart disease Paternal Grandmother    Healthy Son    Healthy Daughter    Healthy Daughter      Social History   Socioeconomic History   Marital status: Married    Spouse name: Not on file   Number of children: 3   Years of education: Not on file   Highest education level: Not on file  Occupational History   Occupation: Truck driver  Tobacco Use   Smoking status: Former    Packs/day: 1.00    Years: 20.00    Pack years: 20.00    Types: Cigarettes    Quit date: 11/30/2013    Years since quitting: 7.4   Smokeless tobacco: Former    Types: Snuff    Quit date: 05/21/2018  Vaping Use   Vaping Use: Never used  Substance and Sexual Activity   Alcohol use: Not Currently   Drug use: No   Sexual activity: Yes  Other Topics Concern   Not on file  Social History Narrative   Married (03/2016)   Lives in [place], [place] (Originally from [place] )   Some family in [place] live in [place].   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   Did play minor league baseball. ([place]   Likes to hunt, fish and plays in competitive soft ball league (slow pitch)   Interest includes trips to [employer] and collecting colorful socks.    Social Determinants of Health   Financial Resource Strain: Not on file  Food Insecurity: Not on file  Transportation Needs: Not on file  Physical Activity: Not on file  Stress: Not on file  Social Connections: Not on file  Intimate Partner Violence: Not on file     Allergies  Allergen Reactions   Diltiazem Hcl Diarrhea and Other (See Comments)    Lethargic      Outpatient  Medications Prior to Visit  Medication Sig Dispense Refill   acetaminophen (TYLENOL) 500 MG tablet Take 1,000 mg by mouth every 6 (six) hours as needed for mild pain.     albuterol (VENTOLIN HFA) 108 (90 Base) MCG/ACT inhaler Inhale 2 puffs into the lungs every 6 (six) hours as needed for wheezing or shortness of breath. 8 g 6   buPROPion (WELLBUTRIN XL) 150 MG 24 hr tablet Take 1 tablet (150 mg total) by mouth daily. Take 1 tablet daily 90 tablet 1   escitalopram (LEXAPRO) 20 MG tablet Take 1 tablet (20 mg total) by mouth daily. 90 tablet 1   fluticasone (FLONASE) 50 MCG/ACT nasal spray Place 1 spray into both nostrils daily as needed for allergies.     hydroxychloroquine (PLAQUENIL) 200 MG tablet Take 1 tablet (200 mg total) by mouth 2 (two) times daily. 180 tablet 0   pantoprazole (PROTONIX) 40 MG tablet Take 1 tablet (40 mg total) by mouth daily. 90 tablet 3   predniSONE (DELTASONE) 10 MG tablet TAKE 1 TABLET (10 MG TOTAL) BY MOUTH DAILY WITH BREAKFAST. 30 tablet 0   Probiotic Product (PROBIOTIC PO) Take 1 capsule by mouth daily.     rosuvastatin (CRESTOR) 40 MG tablet Take 1 tablet (40 mg total) by mouth daily. 90 tablet 3   telmisartan-hydrochlorothiazide (MICARDIS HCT) 80-25 MG tablet TAKE 1 TABLET BY MOUTH EVERY DAY (Patient taking differently: Take 1 tablet by mouth daily.) 90 tablet 2   warfarin (COUMADIN) 5 MG tablet Take 2 tablets daily except take 2 1/2 tablets on Mon and Fri or Take as directed by anticoagulation clinic 180 tablet 0   mycophenolate (CELLCEPT) 500 MG tablet Take 2 tablets (1,000 mg  total) by mouth 2 (two) times daily. 120 tablet 6   sulfamethoxazole-trimethoprim (BACTRIM) 400-80 MG tablet Take 1 tablet by mouth daily. (Patient not taking: Reported on 05/27/2021) 14 tablet 0   No facility-administered medications prior to visit.    Review of Systems  Constitutional:  Negative for chills, fever, malaise/fatigue and weight loss.  HENT:  Negative for congestion, sinus pain and sore throat.   Eyes: Negative.   Respiratory:  Negative for cough, hemoptysis, sputum production, shortness of breath and wheezing.   Cardiovascular:  Negative for chest pain, palpitations, orthopnea, claudication and leg swelling.  Gastrointestinal:  Negative for abdominal pain, heartburn, nausea and vomiting.  Genitourinary: Negative.   Musculoskeletal:  Negative for joint pain and myalgias.  Skin:  Negative for rash.  Neurological:  Negative for weakness.  Endo/Heme/Allergies: Negative.   Psychiatric/Behavioral: Negative.     Objective:   Vitals:   05/27/21 1333  BP: 120/78  Pulse: 87  Temp: 98.7 F (37.1 C)  TempSrc: Oral  SpO2: 96%  Weight: 296 lb 6.4 oz (134.4 kg)  Height: _0  (1.88 m)    Physical Exam Constitutional:      General: He is not in acute distress. HENT:     Head: Normocephalic and atraumatic.  Eyes:     Extraocular Movements: Extraocular movements intact.     Conjunctiva/sclera: Conjunctivae normal.     Pupils: Pupils are equal, round, and reactive to light.  Cardiovascular:     Rate and Rhythm: Normal rate and regular rhythm.     Pulses: Normal pulses.     Heart sounds: Normal heart sounds. No murmur heard. Pulmonary:     Breath sounds: Rales (right base) present.  Abdominal:     General: Bowel sounds are normal.     Palpations:  Abdomen is soft.  Musculoskeletal:     Right lower leg: No edema.     Left lower leg: No edema.  Lymphadenopathy:     Cervical: No cervical adenopathy.  Skin:    General: Skin is warm and dry.  Neurological:     General:  No focal deficit present.     Mental Status: He is alert.  Psychiatric:        Mood and Affect: Mood normal.        Behavior: Behavior normal.        Thought Content: Thought content normal.        Judgment: Judgment normal.   CBC    Component Value Date/Time   WBC 12.5 (H) 04/18/2021 0817   WBC 15.8 (H) 03/24/2021 0303   RBC 4.78 04/18/2021 0817   HGB 14.6 04/18/2021 0817   HCT 43.4 04/18/2021 0817   PLT 254 04/18/2021 0817   MCV 90.8 04/18/2021 0817   MCH 30.5 04/18/2021 0817   MCHC 33.6 04/18/2021 0817   RDW 13.7 04/18/2021 0817   LYMPHSABS 2.9 04/18/2021 0817   MONOABS 1.0 04/18/2021 0817   EOSABS 0.2 04/18/2021 0817   BASOSABS 0.1 04/18/2021 0817   BMP Latest Ref Rng & Units 04/18/2021 03/23/2021 03/20/2021  Glucose 70 - 99 mg/dL 131(H) 107(H) 156(H)  BUN 6 - 20 mg/dL 31(H) 16 28(H)  Creatinine 0.61 - 1.24 mg/dL 1.20 1.24 1.17  BUN/Creat Ratio 9 - 20 - - -  Sodium 135 - 145 mmol/L 137 139 135  Potassium 3.5 - 5.1 mmol/L 4.0 4.7 4.2  Chloride 98 - 111 mmol/L 100 105 99  CO2 22 - 32 mmol/L _0 Calcium 8.9 - 10.3 mg/dL 10.0 9.2 9.7   Labs: 01/13/2021 ANA Positive dsDNA 177 IU/mL (0-9IU/mL) Ribnucleic Protien 2.4 (0- 0.9AI)  01/15/21 MyoMarker 3 Panel is negative  Chest imaging: CTA Chest 01/13/21 1. No demonstrable pulmonary embolus. No thoracic aortic aneurysm or dissection. There are foci of aortic atherosclerosis as well as foci of coronary artery calcification.   2. Extensive airspace opacity bilaterally with overall increased compared to December 2021. Areas of patchy consolidation noted in several areas. The overall appearance is most indicative of atypical organism pneumonia. A degree of bacterial superinfection is question. Note that there has been persistence of multifocal airspace opacity for several months. This circumstance may warrant Pulmonary Medicine consultation with consideration for bronchoscopy for further assessment with respect to  etiology for the parenchymal lung lesions.   3. Stable adenopathy of uncertain etiology. Given the parenchymal lung changes, this adenopathy could have reactive etiology.   4. Hepatic steatosis. Liver appears prominent although incompletely visualized. Gallbladder absent.   5. Thyroid nodules with assessment by thyroid ultrasound 6 days prior. Please see recent thyroid ultrasound report with respect to thyroid nodular assessment.  PFT: PFT Results Latest Ref Rng & Units 03/25/2021 03/08/2020  FVC-Pre L 4.17 4.31  FVC-Predicted Pre % 70 72  FVC-Post L 4.02 4.29  FVC-Predicted Post % 68 72  Pre FEV1/FVC % % 88 86  Post FEV1/FCV % % 91 90  FEV1-Pre L 3.66 3.71  FEV1-Predicted Pre % 79 80  FEV1-Post L 3.67 3.84  DLCO uncorrected ml/min/mmHg 26.04 26.46  DLCO UNC% % 76 77  DLCO corrected ml/min/mmHg 25.42 26.46  DLCO COR %Predicted % 74 77  DLVA Predicted % 96 99  TLC L 6.11 5.98  TLC % Predicted % 77 76  RV % Predicted % 74 67  03/25/21 mild  restrictive defect and mild diffusion defect  Echo 07/31/20:  1. Left ventricular ejection fraction, by estimation, is 60 to 65%. The  left ventricle has normal function. The left ventricle has no regional  wall motion abnormalities. There is mild left ventricular hypertrophy.  Left ventricular diastolic parameters  were normal.   2. Right ventricular systolic function is normal. The right ventricular  size is normal.   3. Left atrial size was mildly dilated.   4. The mitral valve is normal in structure. No evidence of mitral valve  regurgitation. No evidence of mitral stenosis.   5. The aortic valve is normal in structure. Aortic valve regurgitation is  not visualized. No aortic stenosis is present.   6. The inferior vena cava is normal in size with greater than 50%  respiratory variability, suggesting right atrial pressure of 3 mmHg.  NM Myocardial Stress Test 08/01/20 The left ventricular ejection fraction is normal (55-65%). Nuclear  stress EF: 59%. There were no wall motion abnormalities There was no ST segment deviation noted during stress. The study is normal. This is a low risk study. No perfusion defects.  Assessment & Plan:   ILD (interstitial lung disease) ([place]) - Plan: Comp Met (CMET), CBC with Differential, mycophenolate (CELLCEPT) 500 MG tablet  Mixed connective tissue disease ([place])  Therapeutic drug monitoring  Discussion: Wesley Hancock is a 50 year old male, former smoker with history of DVT/PE due to antiphospholipid syndrome diagnosed in September 2020 who presented with shortness of breath and chest pain 01/13/21 with workup showing signs of interstitial lung disease related to lupus/mixed connective tissue overlap syndrome.   He was treated with IV solumedrol in the hospital for 2 days and then transitioned to 31m of prednisone daily along with bactrim prophylaxis which was tapered to 45mdaily on 01/30/21 with initiation of 50072mf mycophenolate. He was then tapered to 63m69mily prednisone on 03/25/21 and an increase in the mycophenolate to 1g BID. His prednisone is currently at 10mg35mly and he will be tapering down to 5mg d91my this weekend for a few days then will be stopping prednisone in early July. Overall his respiratory symptoms have improved significantly and he is able to be more active. I have encouraged him to work on further weight loss and he is considering joining the [employer] p[facility]one will check labs today for cellcept monitoring.   We will repeat a HRCT chest in early September with follow up soon after.   [facility] Office: [phone] Outpatient Medications:    acetaminophen (TYLENOL) 500 MG tablet, Take 1,000 mg by mouth every 6 (six) hours as needed for mild pain., Disp: , Rfl:    albuterol (VENTOLIN HFA) 108 (90 Base) MCG/ACT inhaler, Inhale 2 puffs into the lungs every 6 (six) hours as needed for wheezing or  shortness of breath., Disp: 8 g, Rfl: 6   buPROPion (WELLBUTRIN XL) 150 MG 24 hr tablet, Take 1 tablet (150 mg total) by mouth daily. Take 1 tablet daily, Disp: 90 tablet, Rfl: 1   escitalopram (LEXAPRO) 20 MG tablet, Take 1 tablet (20 mg total) by mouth daily., Disp: 90 tablet, Rfl: 1   fluticasone (FLONASE) 50 MCG/ACT nasal spray, Place 1 spray into both nostrils daily as needed for allergies., Disp: , Rfl:    hydroxychloroquine (PLAQUENIL) 200 MG tablet, Take 1 tablet (200 mg total) by mouth 2 (two) times daily., Disp: 180 tablet, Rfl: 0  pantoprazole (PROTONIX) 40 MG tablet, Take 1 tablet (40 mg total) by mouth daily., Disp: 90 tablet, Rfl: 3   predniSONE (DELTASONE) 10 MG tablet, TAKE 1 TABLET (10 MG TOTAL) BY MOUTH DAILY WITH BREAKFAST., Disp: 30 tablet, Rfl: 0   Probiotic Product (PROBIOTIC PO), Take 1 capsule by mouth daily., Disp: , Rfl:    rosuvastatin (CRESTOR) 40 MG tablet, Take 1 tablet (40 mg total) by mouth daily., Disp: 90 tablet, Rfl: 3   telmisartan-hydrochlorothiazide (MICARDIS HCT) 80-25 MG tablet, TAKE 1 TABLET BY MOUTH EVERY DAY (Patient taking differently: Take 1 tablet by mouth daily.), Disp: 90 tablet, Rfl: 2   warfarin (COUMADIN) 5 MG tablet, Take 2 tablets daily except take 2 1/2 tablets on Mon and Fri or Take as directed by anticoagulation clinic, Disp: 180 tablet, Rfl: 0   mycophenolate (CELLCEPT) 500 MG tablet, Take 2 tablets (1,000 mg total) by mouth 2 (two) times daily., Disp: 360 tablet, Rfl: 3

## 2021-05-29 NOTE — Telephone Encounter (Signed)
Nothing noted in message. Will close encounter.  

## 2021-06-04 ENCOUNTER — Ambulatory Visit (INDEPENDENT_AMBULATORY_CARE_PROVIDER_SITE_OTHER): Payer: BC Managed Care – PPO | Admitting: General Practice

## 2021-06-04 ENCOUNTER — Other Ambulatory Visit: Payer: Self-pay

## 2021-06-04 DIAGNOSIS — Z7901 Long term (current) use of anticoagulants: Secondary | ICD-10-CM | POA: Diagnosis not present

## 2021-06-04 LAB — POCT INR: INR: 2.7 (ref 2.0–3.0)

## 2021-06-04 NOTE — Patient Instructions (Signed)
Pre visit review using our clinic review tool, if applicable. No additional management support is needed unless otherwise documented below in the visit note.  Continue to take 2 tablets every day except 3 tablets on Monday and Thursdays.  Re-check in 3 weeks.    *Patient is taking 5 mg of prednisone for 14 days. *Bactrim dc'd

## 2021-06-05 NOTE — Progress Notes (Signed)
I have reviewed and agree with this plan   Adonai Selsor, NP  

## 2021-06-12 ENCOUNTER — Encounter: Payer: Self-pay | Admitting: Neurology

## 2021-06-12 ENCOUNTER — Ambulatory Visit: Payer: BC Managed Care – PPO | Admitting: Neurology

## 2021-06-12 ENCOUNTER — Encounter: Payer: Self-pay | Admitting: *Deleted

## 2021-06-12 VITALS — BP 144/91 | HR 98 | Ht 74.0 in | Wt 294.5 lb

## 2021-06-12 DIAGNOSIS — D6861 Antiphospholipid syndrome: Secondary | ICD-10-CM | POA: Diagnosis not present

## 2021-06-12 DIAGNOSIS — I63412 Cerebral infarction due to embolism of left middle cerebral artery: Secondary | ICD-10-CM | POA: Diagnosis not present

## 2021-06-12 DIAGNOSIS — R4701 Aphasia: Secondary | ICD-10-CM | POA: Diagnosis not present

## 2021-06-12 DIAGNOSIS — R799 Abnormal finding of blood chemistry, unspecified: Secondary | ICD-10-CM | POA: Diagnosis not present

## 2021-06-12 DIAGNOSIS — R785 Finding of other psychotropic drug in blood: Secondary | ICD-10-CM | POA: Diagnosis not present

## 2021-06-12 DIAGNOSIS — G4733 Obstructive sleep apnea (adult) (pediatric): Secondary | ICD-10-CM | POA: Diagnosis not present

## 2021-06-12 NOTE — Patient Instructions (Addendum)
I had a long d/w patient about his recent stroke, antiphospholipid antibody syndrome, sleep apnoea,risk for recurrent stroke/TIAs, personally independently reviewed imaging studies and stroke evaluation results and answered questions.Continue warfarin daily  with target INR between 2-3 for secondary stroke prevention and maintain strict control of hypertension with blood pressure goal below 130/90, diabetes with hemoglobin A1c goal below 6.5% and lipids with LDL cholesterol goal below 70 mg/dL. I also advised the patient to eat a healthy diet with plenty of whole grains, cereals, fruits and vegetables, exercise regularly and maintain ideal body weight .check follow-up lipid profile and transcranial Doppler bubble study for PFO.  He was advised to be compliant with using his CPAP every night.  Patient was cleared to drive a commercial vehicle.  Followup in the future with me in 3 months or call earlier if necessary. Stroke Prevention Some medical conditions and behaviors are associated with a higher chance of having a stroke. You can help prevent a stroke by making nutrition, lifestyle,and other changes, including managing any medical conditions you may have. What nutrition changes can be made?  Eat healthy foods. You can do this by: Choosing foods high in fiber, such as fresh fruits and vegetables and whole grains. Eating at least 5 or more servings of fruits and vegetables a day. Try to fill half of your plate at each meal with fruits and vegetables. Choosing lean protein foods, such as lean cuts of meat, poultry without skin, fish, tofu, beans, and nuts. Eating low-fat dairy products. Avoiding foods that are high in salt (sodium). This can help lower blood pressure. Avoiding foods that have saturated fat, trans fat, and cholesterol. This can help prevent high cholesterol. Avoiding processed and premade foods. Follow your health care provider's specific guidelines for losing weight, controlling high  blood pressure (hypertension), lowering high cholesterol, and managing diabetes. These may include: Reducing your daily calorie intake. Limiting your daily sodium intake to 1,500 milligrams (mg). Using only healthy fats for cooking, such as olive oil, canola oil, or sunflower oil. Counting your daily carbohydrate intake. What lifestyle changes can be made? Maintain a healthy weight. Talk to your health care provider about your ideal weight. Get at least 30 minutes of moderate physical activity at least 5 days a week. Moderate activity includes brisk walking, biking, and swimming. Do not use any products that contain nicotine or tobacco, such as cigarettes and e-cigarettes. If you need help quitting, ask your health care provider. It may also be helpful to avoid exposure to secondhand smoke. Limit alcohol intake to no more than 1 drink a day for nonpregnant women and 2 drinks a day for men. One drink equals 12 oz of beer, 5 oz of wine, or 1 oz of hard liquor. Stop any illegal drug use. Avoid taking birth control pills. Talk to your health care provider about the risks of taking birth control pills if: You are over 83 years old. You smoke. You get migraines. You have ever had a blood clot. What other changes can be made? Manage your cholesterol levels. Eating a healthy diet is important for preventing high cholesterol. If cholesterol cannot be managed through diet alone, you may also need to take medicines. Take any prescribed medicines to control your cholesterol as told by your health care provider. Manage your diabetes. Eating a healthy diet and exercising regularly are important parts of managing your blood sugar. If your blood sugar cannot be managed through diet and exercise, you may need to take medicines. Take any  prescribed medicines to control your diabetes as told by your health care provider. Control your hypertension. To reduce your risk of stroke, try to keep your blood pressure  below 130/80. Eating a healthy diet and exercising regularly are an important part of controlling your blood pressure. If your blood pressure cannot be managed through diet and exercise, you may need to take medicines. Take any prescribed medicines to control hypertension as told by your health care provider. Ask your health care provider if you should monitor your blood pressure at home. Have your blood pressure checked every year, even if your blood pressure is normal. Blood pressure increases with age and some medical conditions. Get evaluated for sleep disorders (sleep apnea). Talk to your health care provider about getting a sleep evaluation if you snore a lot or have excessive sleepiness. Take over-the-counter and prescription medicines only as told by your health care provider. Aspirin or blood thinners (antiplatelets or anticoagulants) may be recommended to reduce your risk of forming blood clots that can lead to stroke. Make sure that any other medical conditions you have, such as atrial fibrillation or atherosclerosis, are managed. What are the warning signs of a stroke? The warning signs of a stroke can be easily remembered as BEFAST. B is for balance. Signs include: Dizziness. Loss of balance or coordination. Sudden trouble walking. E is for eyes. Signs include: A sudden change in vision. Trouble seeing. F is for face. Signs include: Sudden weakness or numbness of the face. The face or eyelid drooping to one side. A is for arms. Signs include: Sudden weakness or numbness of the arm, usually on one side of the body. S is for speech. Signs include: Trouble speaking (aphasia). Trouble understanding. T is for time. These symptoms may represent a serious problem that is an emergency. Do not wait to see if the symptoms will go away. Get medical help right away. Call your local emergency services (911 in the [place] Do not drive yourself to the hospital. Other signs of stroke may  include: A sudden, severe headache with no known cause. Nausea or vomiting. Seizure. Where to find more information For more information, visit: American Stroke Association: www.strokeassociation.org National Stroke Association: www.stroke.org Summary You can prevent a stroke by eating healthy, exercising, not smoking, limiting alcohol intake, and managing any medical conditions you may have. Do not use any products that contain nicotine or tobacco, such as cigarettes and e-cigarettes. If you need help quitting, ask your health care provider. It may also be helpful to avoid exposure to secondhand smoke. Remember BEFAST for warning signs of stroke. Get help right away if you or a loved one has any of these signs. This information is not intended to replace advice given to you by your health care provider. Make sure you discuss any questions you have with your healthcare provider. Document Revised: 10/29/2017 Document Reviewed: 12/22/2016 Elsevier Patient Education  2021 [employer].

## 2021-06-12 NOTE — Progress Notes (Signed)
[facility] [address] [place]. [place] [address] [phone]       OFFICE CONSULT NOTE  [name] Date of Birth:  05-Apr-1971 Medical Record Number:  [id number]   Referring MD: Wesley Hancock  Reason for Referral: Stroke  HPI: Wesley Hancock is a 50 year old Caucasian male seen today for initial office consultation visit for stroke.  History is obtained from the patient and his wife and review of electronic medical records and I personally reviewed available imaging films in PACS.  He has past medical history of obesity, hypertension, hyperlipidemia, antiphospholipid antibody syndrome, DVT, pulmonary embolism.  He presented on 03/20/2021 with sudden onset of speech difficulties and trouble finding words.  He presented be on time window for tPA.  MRI scan showed acute infarct in left frontal lobe and insular region.  CTA of the head and neck showed irregularity of superior division MCA with distal occlusion.  CT perfusion showed no significant penumbra.  Transthoracic echo showed normal ejection fraction without cardiac source of embolism.  LDL cholesterol elevated to 34 mg percent and hemoglobin A1c was 6.0.  Patient was on full dose Lovenox at home this was switched to warfarin with target INR goal between 2 and 3.  Patient has done well and has had no recurrent TIA or stroke symptoms.  However his INR has been fluctuating with last INR on 05/07/2021 being 1.8 and he is currently on 10 mg daily except he takes 12.5 on Mondays and Fridays upon direction from the Coumadin clinic.  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  He does use his CPAP machine regularly at night and he just got it recently.  He has no new complaints.  He has no prior history of strokes or TIAs.  He has prior history of DVT and pulmonary embolism and is on long-term anticoagulation for his antiphospholipid antibody syndrome.  And sees Dr. Burney Hancock.  ROS:   14  system review of systems is positive for speech difficulties, word finding difficulties, snoring, sleep apnea and all other systems negative  PMH:  Past Medical History:  Diagnosis Date   Acute nonintractable headache 09/06/2019   Acute respiratory failure with hypoxia ([place]) 08/30/2019   AKI (acute kidney injury) ([place]) 08/30/2019   Antiphospholipid syndrome ([place]) 10/30/2019   Anxiety    APS (antiphospholipid syndrome) ([place])    Arthritis    Colon polyp    ? hyperplastic   Depression    Diarrhea 05/21/2020   DVT (deep venous thrombosis) ([name])    Dyspnea on exertion 09/08/2017   "Quit smoking" 2015 with onset of symptoms in 2016  Spirometry 09/08/2017  Flat f/v loop  - d/c acei 09/08/2017  - 01/04/2020   Walked RA x two laps =  approx 576ft @ fast pace - stopped due to end of study/ min sob with sats of 93 % at the end of the study. - PFT's  03/08/20   FEV1 3.84 (83 % ) ratio 0..90  p 3 % improvement from saba p ? 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 06/26/20, 03/08/20   Snoring 07/09/2020   Stroke ([place])    Upper airway cough syndrome 01/05/2020   Onset  mid Jan 2021 while on otc PPI  - max rx for gerd 01/04/2020 >>>      Witnessed apneic spells 07/09/2020    Social History:  Social History   Socioeconomic History   Marital status: Married    Spouse name: Wesley Hancock   Number of children: 3   Years of education: Not on file   Highest education level: Not on file  Occupational History   Occupation: Truck driver  Tobacco Use   Smoking status: Former    Packs/day: 1.00    Years: 20.00    Pack years: 20.00    Types: Cigarettes    Quit date: 11/30/2013    Years since quitting: 7.5   Smokeless tobacco: Former    Types: Snuff    Quit date: 05/21/2018  Vaping Use   Vaping Use: Never used  Substance and Sexual Activity   Alcohol use: Not Currently   Drug use: No   Sexual activity: Yes  Other Topics Concern   Not on file  Social History Narrative   Lives with wife and daughter at home   Right Handed   Drinks 4 cups caffeine daily   Social Determinants of Health   Financial Resource Strain: Not on file  Food Insecurity: Not on file  Transportation Needs: Not on file  Physical Activity: Not on file  Stress: Not on file  Social Connections: Not on file  Intimate Partner Violence: Not on file    Medications:   Current Outpatient Medications on File Prior to Visit  Medication Sig Dispense Refill   acetaminophen (TYLENOL) 500 MG tablet Take 1,000 mg by mouth every 6 (six) hours as needed for mild pain.     albuterol (VENTOLIN HFA) 108 (90 Base) MCG/ACT inhaler Inhale 2 puffs into the lungs every 6 (six) hours as needed for wheezing or shortness of breath. 8 g 6   buPROPion (WELLBUTRIN XL) 150 MG 24 hr tablet Take 1 tablet (150 mg total) by mouth daily. Take 1 tablet daily 90 tablet 1   escitalopram (LEXAPRO) 20 MG tablet Take 1 tablet (20 mg total) by mouth daily. 90 tablet 1   fluticasone (FLONASE) 50 MCG/ACT nasal spray Place 1 spray into both nostrils daily as needed for allergies.     hydroxychloroquine (PLAQUENIL) 200 MG tablet  Take 1 tablet (200 mg total) by mouth 2 (two) times daily. 180 tablet 0   mycophenolate (CELLCEPT) 500 MG tablet Take 2 tablets (1,000 mg total) by mouth 2 (two) times daily. 360 tablet 3   pantoprazole (PROTONIX) 40 MG tablet Take 1 tablet (40 mg total) by mouth daily. 90 tablet 3   predniSONE (DELTASONE) 10 MG tablet TAKE 1 TABLET (10 MG TOTAL) BY MOUTH DAILY WITH BREAKFAST. 30 tablet 0   Probiotic Product (PROBIOTIC PO) Take 1 capsule by mouth daily.     telmisartan-hydrochlorothiazide (MICARDIS HCT) 80-25  MG tablet TAKE 1 TABLET BY MOUTH EVERY DAY (Patient taking differently: Take 1 tablet by mouth daily.) 90 tablet 2   warfarin (COUMADIN) 5 MG tablet Take 2 tablets daily except take 2 1/2 tablets on Mon and Fri or Take as directed by anticoagulation clinic 180 tablet 0   rosuvastatin (CRESTOR) 40 MG tablet Take 1 tablet (40 mg total) by mouth daily. 90 tablet 3   No current facility-administered medications on file prior to visit.    Allergies:   Allergies  Allergen Reactions   Diltiazem Hcl Diarrhea and Other (See Comments)    Lethargic     Physical Exam General: Obese middle-age Caucasian male, seated, in no evident distress Head: head normocephalic and atraumatic.   Neck: supple with no carotid or supraclavicular bruits Cardiovascular: regular rate and rhythm, no murmurs Musculoskeletal: no deformity Skin:  no rash/petichiae Vascular:  Normal pulses all extremities  Neurologic Exam Mental Status: Awake and fully alert. Oriented to place and time. Recent and remote memory intact. Attention span, concentration and fund of knowledge appropriate. Mood and affect appropriate.  Speech is clear without any expressive or receptive difficulties.  Able to name 15 animals that can walk on 4 legs.  Good repetition and naming Cranial Nerves: Fundoscopic exam reveals sharp disc margins. Pupils equal, briskly reactive to light. Extraocular movements full without nystagmus. Visual fields full  to confrontation. Hearing intact. Facial sensation intact. Face, tongue, palate moves normally and symmetrically.  Motor: Normal bulk and tone. Normal strength in all tested extremity muscles. Sensory.: intact to touch , pinprick , position and vibratory sensation.  Coordination: Rapid alternating movements normal in all extremities. Finger-to-nose and heel-to-shin performed accurately bilaterally. Gait and Station: Arises from chair without difficulty. Stance is normal. Gait demonstrates normal stride length and balance . Able to heel, toe and tandem walk without difficulty.  Reflexes: 1+ and symmetric. Toes downgoing.   NIHSS  0 Modified Rankin  0   ASSESSMENT: 50 year old male with left frontal MCA branch infarct and April 2022 likely due to antiphospholipid antibody syndrome.  Vascular risk factors of obesity, hyperlipidemia, hypertension, , sleep apnea and hypercoagulability from antiphospholipid antibody syndrome     PLAN: I had a long d/w patient about his recent stroke, antiphospholipid antibody syndrome, sleep apnoea,risk for recurrent stroke/TIAs, personally independently reviewed imaging studies and stroke evaluation results and answered questions.Continue warfarin daily  with target INR between 2-3 for secondary stroke prevention and maintain strict control of hypertension with blood pressure goal below 130/90, diabetes with hemoglobin A1c goal below 6.5% and lipids with LDL cholesterol goal below 70 mg/dL. I also advised the patient to eat a healthy diet with plenty of whole grains, cereals, fruits and vegetables, exercise regularly and maintain ideal body weight .check follow-up lipid profile and transcranial Doppler bubble study for PFO.  He was advised to be compliant with using his CPAP every night.  Patient was cleared to drive a commercial vehicle.  Followup in the future with me in 3 months or call earlier if necessary.  Greater than 50% time during this 45-minute visit was spent  on counseling and coordination of care about his stroke and discussion about stroke prevention Antony Contras, MD Note: This document was prepared with digital dictation and possible smart phrase technology. Any transcriptional errors that result from this process are unintentional.

## 2021-06-13 LAB — LIPID PANEL
Chol/HDL Ratio: 6.2 ratio — ABNORMAL HIGH (ref 0.0–5.0)
Cholesterol, Total: 280 mg/dL — ABNORMAL HIGH (ref 100–199)
HDL: 45 mg/dL (ref >39)
LDL Chol Calc (NIH): 207 mg/dL — ABNORMAL HIGH (ref 0–99)
Triglycerides: 148 mg/dL (ref 0–149)
VLDL Cholesterol Cal: 28 mg/dL (ref 5–40)

## 2021-06-24 ENCOUNTER — Other Ambulatory Visit: Payer: Self-pay | Admitting: Neurology

## 2021-06-24 MED ORDER — REPATHA SURECLICK 140 MG/ML ~~LOC~~ SOAJ: 140.0000 mg | SUBCUTANEOUS | 3 refills | Status: DC

## 2021-06-25 ENCOUNTER — Other Ambulatory Visit: Payer: Self-pay

## 2021-06-25 ENCOUNTER — Other Ambulatory Visit: Payer: Self-pay | Admitting: Physician Assistant

## 2021-06-25 ENCOUNTER — Other Ambulatory Visit: Payer: Self-pay | Admitting: Rheumatology

## 2021-06-25 ENCOUNTER — Ambulatory Visit (INDEPENDENT_AMBULATORY_CARE_PROVIDER_SITE_OTHER): Payer: BC Managed Care – PPO | Admitting: General Practice

## 2021-06-25 ENCOUNTER — Telehealth: Payer: Self-pay

## 2021-06-25 DIAGNOSIS — Z7901 Long term (current) use of anticoagulants: Secondary | ICD-10-CM

## 2021-06-25 LAB — POCT INR: INR: 2.7 (ref 2.0–3.0)

## 2021-06-25 NOTE — Patient Instructions (Addendum)
Pre visit review using our clinic review tool, if applicable. No additional management support is needed unless otherwise documented below in the visit note.  Continue to take 2 tablets every day except 3 tablets on Monday and Thursdays.  Re-check in 4 weeks.

## 2021-06-25 NOTE — Progress Notes (Signed)
I have reviewed and agree with this plan   Andersen Mckiver, NP  

## 2021-06-25 NOTE — Telephone Encounter (Signed)
PA for [place] has been sent via CMM.  (Key: [id number])  Your information has been submitted to [facility]. To check for an updated outcome later, reopen this PA request from your dashboard.  If [facility] has not responded to your request within 24 hours, contact [facility] at [phone]. If you think there may be a problem with your PA request, use our live chat feature at the bottom right.

## 2021-06-26 ENCOUNTER — Other Ambulatory Visit: Payer: Self-pay | Admitting: General Practice

## 2021-06-26 DIAGNOSIS — G4733 Obstructive sleep apnea (adult) (pediatric): Secondary | ICD-10-CM | POA: Diagnosis not present

## 2021-06-26 NOTE — Telephone Encounter (Signed)
Last Visit: 04/17/2021  Next Visit: 07/29/2021  Labs: 05/27/2021 Glucose 102, Neutrophils Relative % 81.5, Lymphocytes Relative % 9.3  Eye exam: not on file. Left message to advise patient we need his PLQ eye exam   Current Dose per office note 04/17/2021: Plaquenil 200 mg p.o. twice daily started  DX:Mixed connective tissue disease  Last Fill: 03/26/2021  Okay to refill Plaquenil?

## 2021-06-30 NOTE — Telephone Encounter (Signed)
I called [facility] twice and spoke with two different representatives- I was connected both time over the to Specialty pharmacy PA department for [facility] but both times the connection failed. I was given the # of [phone] and tried to call this #, but did not connect over received a busy signal.  Will try again later.

## 2021-07-01 IMAGING — DX PORTABLE CHEST - 1 VIEW
1 series · 1 of 1 positions shown · non-contrast
Comparison: 02/08/2016

CLINICAL DATA: Chills and body aches. Hemoptysis with cough that
began yesterday. Some shortness-of-breath. Pending [id number] test.

EXAM:
PORTABLE CHEST 1 VIEW

[chest ap]
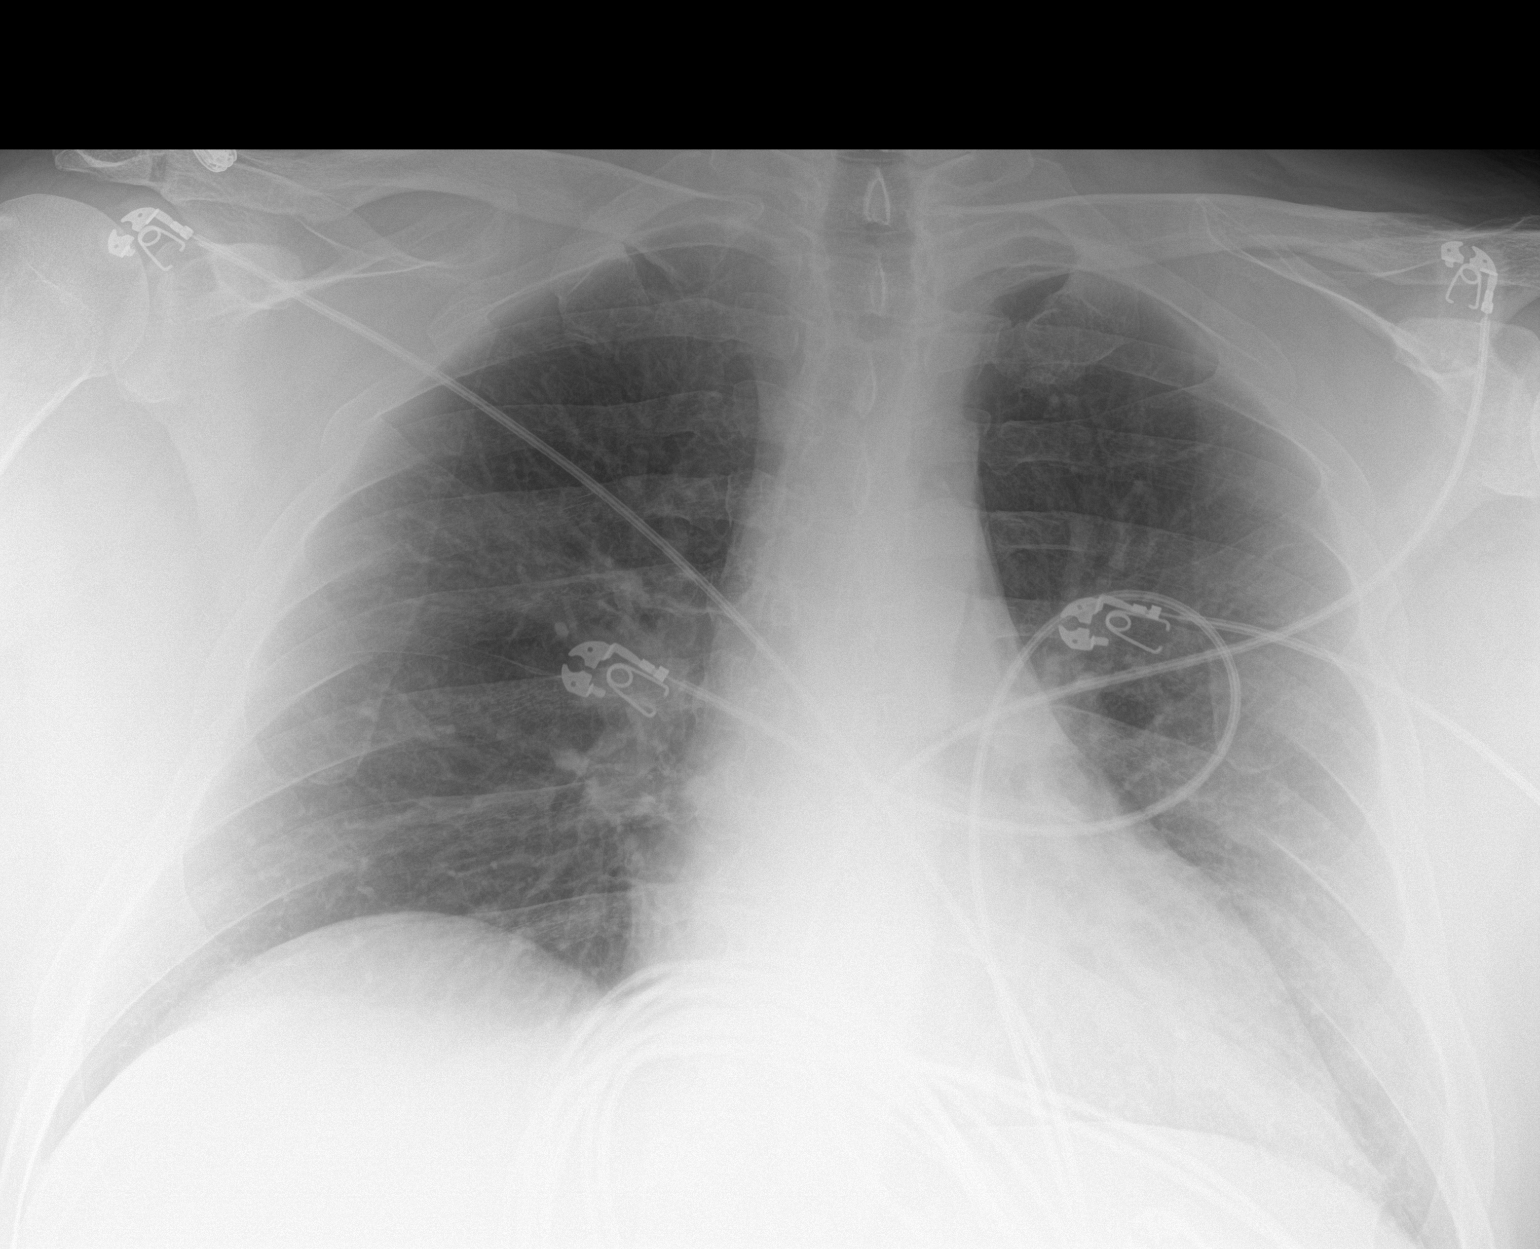

[1 of 1 positions shown; findings below may reference images not displayed]

FINDINGS: Lungs are adequately inflated and otherwise clear. Cardiomediastinal
silhouette and remainder the exam is unchanged.
IMPRESSION: No active disease.

## 2021-07-02 ENCOUNTER — Other Ambulatory Visit: Payer: Self-pay

## 2021-07-02 ENCOUNTER — Ambulatory Visit (HOSPITAL_COMMUNITY)
Admission: RE | Admit: 2021-07-02 | Discharge: 2021-07-02 | Disposition: A | Discharge status: Home / Self Care | Payer: BC Managed Care – PPO | Source: Ambulatory Visit | Attending: Neurology | Admitting: Neurology

## 2021-07-02 DIAGNOSIS — R4701 Aphasia: Secondary | ICD-10-CM | POA: Diagnosis not present

## 2021-07-02 NOTE — Progress Notes (Signed)
TCD w/ bubble study completed.   Please see CV Proc for preliminary results.   Deidrick Rainey, RDMS, RVT  

## 2021-07-02 NOTE — Telephone Encounter (Signed)
I called [facility] to check status again on this and was transferred over 5 different times. I finally was transferred to the correct specialty pharmacy PA department and spoke with the Pharmacist Mickel Baas.   PA was denied since pt has not tried and failed [place]. Per the pt's record, I could not find a trail/failure of this drug.  Medication has been denied and either med change or appeal will be needed. I have reached out to the pt about this because the PA department stated a one time override was given at the pharmacy for the [place] and pt may be eligible for co-pay card since drug was denied. See my chart message from 07/02/2021 for reference.

## 2021-07-03 ENCOUNTER — Encounter (HOSPITAL_COMMUNITY): Payer: Self-pay | Admitting: Neurology

## 2021-07-15 ENCOUNTER — Ambulatory Visit (INDEPENDENT_AMBULATORY_CARE_PROVIDER_SITE_OTHER): Payer: Self-pay | Admitting: Physician Assistant

## 2021-07-15 ENCOUNTER — Other Ambulatory Visit: Payer: Self-pay

## 2021-07-15 ENCOUNTER — Encounter: Payer: Self-pay | Admitting: Physician Assistant

## 2021-07-15 VITALS — BP 140/80 | HR 98 | Temp 98.1°F | Ht 74.0 in | Wt 295.5 lb

## 2021-07-15 DIAGNOSIS — Z8673 Personal history of transient ischemic attack (TIA), and cerebral infarction without residual deficits: Secondary | ICD-10-CM

## 2021-07-15 DIAGNOSIS — I639 Cerebral infarction, unspecified: Secondary | ICD-10-CM

## 2021-07-15 DIAGNOSIS — F419 Anxiety disorder, unspecified: Secondary | ICD-10-CM

## 2021-07-15 DIAGNOSIS — F32A Depression, unspecified: Secondary | ICD-10-CM

## 2021-07-15 NOTE — Progress Notes (Signed)
Wesley Hancock is a 50 y.o. male is here to follow up.  I acted as a Education administrator for [employer], PA-C Anselmo Pickler, LPN   History of Present Illness:   Chief Complaint  Patient presents with   Discuss health issues    HPI  CVA Had a stroke at the end of April. Has been seeing Dr. Leonie Man and is compliant with all recommendations. He continues on warfarin daily. He is using CPAP regularly. He recently had a Bubble study that was reportedly unremarkable.  Anxiety/Depression Currently on wellbutrin xl 150 mg daily and lexapro 20 mg daily. Doing well. Remains optimistic with all of his ongoing medical issues.   There are no preventive care reminders to display for this patient.   Past Medical History:  Diagnosis Date   Acute nonintractable headache 09/06/2019   Acute respiratory failure with hypoxia ([place]) 08/30/2019   AKI (acute kidney injury) ([place]) 08/30/2019   Antiphospholipid syndrome ([place]) 10/30/2019   Anxiety    APS (antiphospholipid syndrome) ([place])    Arthritis    Colon polyp    ? hyperplastic   Depression    Diarrhea 05/21/2020   DVT (deep venous thrombosis) ([name])    Dyspnea on exertion 09/08/2017   "Quit smoking" 2015 with onset of symptoms in 2016  Spirometry 09/08/2017  Flat f/v loop  - d/c acei 09/08/2017  - 01/04/2020   Walked RA x two laps =  approx 510f @ fast pace - stopped due to end of study/ min sob with sats of 93 % at the end of the study. - PFT's  03/08/20   FEV1 3.84 (83 % ) ratio 0..90  p 3 % improvement from saba p ? 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 06/26/20, 03/08/20   Snoring 07/09/2020   Stroke ([place]    Upper airway cough syndrome 01/05/2020   Onset mid Jan 2021 while on otc PPI  - max rx for gerd 01/04/2020 >>>      Witnessed apneic spells 07/09/2020     Social History   Tobacco Use   Smoking status: Former    Packs/day: 1.00    Years: 20.00    Pack years: 20.00    Types: Cigarettes    Quit date: 11/30/2013    Years since quitting: 7.6   Smokeless tobacco: Former    Types: Snuff    Quit date: 05/21/2018  Vaping Use   Vaping Use: Never used  Substance Use Topics   Alcohol use: Not Currently   Drug use: No    Past Surgical History:  Procedure Laterality Date  BRONCHIAL WASHINGS  01/14/2021   Procedure: BRONCHIAL WASHINGS;  Surgeon: Freddi Starr, MD;  Location: [facility];  Service: Pulmonary;;   CHOLECYSTECTOMY     VIDEO BRONCHOSCOPY N/A 01/14/2021   Procedure: VIDEO BRONCHOSCOPY WITHOUT FLUORO;  Surgeon: Freddi Starr, MD;  Location: [facility];  Service: Pulmonary;  Laterality: N/A;    Family History  Problem Relation Age of Onset   Diabetes Mother    Hyperlipidemia Mother    Hypertension Mother    Thyroid disease Mother        uncertain type--had surg--no cancer   Heart disease Father 4   Hyperlipidemia Father    Hypertension Father    Prostate cancer Father 16   Lung cancer Father 70       Dx 06/18/2017   Colon polyps Father    Irritable bowel syndrome Father    Diverticulitis Father    Diabetes Sister    Hyperlipidemia Sister     Hypertension Sister    Diabetes Maternal Grandmother    Heart disease Maternal Grandmother    Hyperlipidemia Maternal Grandmother    Hypertension Maternal Grandmother    Colon cancer Maternal Grandmother    Heart disease Maternal Grandfather    Hyperlipidemia Maternal Grandfather    Hypertension Maternal Grandfather    Stroke Maternal Grandfather    Liver cancer Maternal Grandfather    Irritable bowel syndrome Maternal Grandfather    Heart disease Paternal Grandmother    Healthy Son    Healthy Daughter    Healthy Daughter     PMHx, SurgHx, SocialHx, FamHx, Medications, and Allergies were reviewed in the Visit Navigator and updated as appropriate.   Patient Active Problem List   Diagnosis Date Noted   ILD (interstitial lung disease) ([place]) 05/27/2021   Long term (current) use of anticoagulants 03/26/2021   CVA (cerebral vascular accident) ([place]) 03/20/2021   Mixed connective tissue disease ([place]) 01/21/2021   Hyperglycemia 01/16/2021   Multinodular goiter 01/10/2021   OSA on CPAP 10/11/2020   Esophageal spasm    Colon polyp    Arthritis    APS (antiphospholipid syndrome) ([name])    Anxiety    CKD (chronic kidney disease) stage 2, GFR 60-89 ml/min 07/11/2020   Paresthesia of right foot 11/08/2019   Morbid obesity due to excess calories ([place]) c/b hbp/ dvt/PE 09/10/2019   DVT (deep venous thrombosis) ([place]) 08/30/2019   Multiple pulmonary emboli ([place]) 08/22/2019   Depression 06/24/2017   Dyslipidemia 09/18/2015   Hypertensive heart disease 09/23/2014   Nutcracker esophagus 09/21/2014   Lung nodule 09/21/2014    Social History   Tobacco Use   Smoking status: Former    Packs/day: 1.00    Years: 20.00    Pack years: 20.00    Types: Cigarettes    Quit date: 11/30/2013    Years since quitting: 7.6   Smokeless tobacco: Former    Types: Snuff    Quit date: 05/21/2018  Vaping Use   Vaping Use: Never used  Substance Use Topics   Alcohol use: Not Currently   Drug use: No     Current Medications and Allergies:    Current Outpatient Medications:    acetaminophen (TYLENOL) 500 MG tablet, Take 1,000 mg by mouth every 6 (six) hours as needed for mild pain., Disp: , Rfl:    albuterol (VENTOLIN HFA) 108 (90 Base) MCG/ACT inhaler, Inhale 2 puffs into the lungs every 6 (six) hours as needed for wheezing or shortness of breath., Disp: 8 g, Rfl: 6  buPROPion (WELLBUTRIN XL) 150 MG 24 hr tablet, Take 1 tablet (150 mg total) by mouth daily. Take 1 tablet daily, Disp: 90 tablet, Rfl: 1   escitalopram (LEXAPRO) 20 MG tablet, Take 1 tablet (20 mg total) by mouth daily., Disp: 90 tablet, Rfl: 1   fluticasone (FLONASE) 50 MCG/ACT nasal spray, Place 1 spray into both nostrils daily as needed for allergies., Disp: , Rfl:    hydroxychloroquine (PLAQUENIL) 200 MG tablet, TAKE 1 TABLET BY MOUTH TWICE A DAY, Disp: 180 tablet, Rfl: 0   mycophenolate (CELLCEPT) 500 MG tablet, Take 2 tablets (1,000 mg total) by mouth 2 (two) times daily., Disp: 360 tablet, Rfl: 3   pantoprazole (PROTONIX) 40 MG tablet, Take 1 tablet (40 mg total) by mouth daily., Disp: 90 tablet, Rfl: 3   Probiotic Product (PROBIOTIC PO), Take 1 capsule by mouth daily., Disp: , Rfl:    telmisartan-hydrochlorothiazide (MICARDIS HCT) 80-25 MG tablet, TAKE 1 TABLET BY MOUTH EVERY DAY (Patient taking differently: Take 1 tablet by mouth daily.), Disp: 90 tablet, Rfl: 2   warfarin (COUMADIN) 5 MG tablet, Take 2 tablets daily except take 3 tablets on Mon and Thurs or Take as directed by anticoagulation clinic, Disp: 195 tablet, Rfl: 1   Allergies  Allergen Reactions   Diltiazem Hcl Diarrhea and Other (See Comments)    Lethargic     Review of Systems   ROS Negative unless otherwise specified per HPI.  Vitals:   Vitals:   07/15/21 1332  BP: 140/80  Pulse: 98  Temp: 98.1 F (36.7 C)  TempSrc: Temporal  SpO2: 94%  Weight: 295 lb 8 oz (134 kg)  Height: '6\' 2"'$  (1.88 m)     Body mass index is 37.94  kg/m.   Physical Exam:    Physical Exam Vitals and nursing note reviewed.  Constitutional:      General: He is not in acute distress.    Appearance: He is well-developed. He is not ill-appearing or toxic-appearing.  Cardiovascular:     Rate and Rhythm: Normal rate and regular rhythm.     Pulses: Normal pulses.     Heart sounds: Normal heart sounds, S1 normal and S2 normal.     Comments: No LE edema Pulmonary:     Effort: Pulmonary effort is normal.     Breath sounds: Normal breath sounds.  Skin:    General: Skin is warm and dry.  Neurological:     Mental Status: He is alert.     GCS: GCS eye subscore is 4. GCS verbal subscore is 5. GCS motor subscore is 6.  Psychiatric:        Speech: Speech normal.        Behavior: Behavior normal. Behavior is cooperative.     Assessment and Plan:    Mylz was seen today for discuss health issues.  Diagnoses and all orders for this visit:  Cerebrovascular accident (CVA), unspecified mechanism ([place]) We reviewed current guidelines and specialists notes from the past three months while we were in office today. Continue management per multiple specialists. Denies any acute needs. We will be completing STD paperwork for September 8th - September 22nd to allow him to attend appointments, rest, and continue to work on Mudlogger of chronic medical issues.  Anxiety and depression Overall doing well with medications. Continue wellbutrion 150 mg xr and lexapro 20 mg daily.  CMA or LPN served as scribe during this visit. History, Physical, and Plan performed by medical provider. The above documentation has been reviewed  and is accurate and complete.   Inda Coke, PA-C [place], [place] 07/15/2021  Follow-up: No follow-ups on file.

## 2021-07-15 NOTE — Progress Notes (Deleted)
Office Visit Note  Patient: Wesley Hancock             Date of Birth: 1971/06/11           MRN: [id number]             PCP: Inda Coke, PA Referring: Inda Coke, PA Visit Date: 07/29/2021 Occupation: '@GUAROCC'$ @  Subjective:  No chief complaint on file.   History of Present Illness: Wesley Hancock is a 50 y.o. male ***   Activities of Daily Living:  Patient reports morning stiffness for *** {minute/hour:19697}.   Patient {ACTIONS;DENIES/REPORTS:21021675::"Denies"} nocturnal pain.  Difficulty dressing/grooming: {ACTIONS;DENIES/REPORTS:21021675::"Denies"} Difficulty climbing stairs: {ACTIONS;DENIES/REPORTS:21021675::"Denies"} Difficulty getting out of chair: {ACTIONS;DENIES/REPORTS:21021675::"Denies"} Difficulty using hands for taps, buttons, cutlery, and/or writing: {ACTIONS;DENIES/REPORTS:21021675::"Denies"}  No Rheumatology ROS completed.   PMFS History:  Patient Active Problem List   Diagnosis Date Noted   ILD (interstitial lung disease) ([place]) 05/27/2021   Long term (current) use of anticoagulants 03/26/2021   CVA (cerebral vascular accident) ([place]) 03/20/2021   Mixed connective tissue disease ([place]) 01/21/2021   Hyperglycemia 01/16/2021   Multinodular goiter 01/10/2021   OSA on CPAP 10/11/2020   Esophageal spasm    Colon polyp    Arthritis    APS (antiphospholipid syndrome) ([name])    Anxiety    CKD (chronic kidney disease) stage 2, GFR 60-89 ml/min 07/11/2020   Paresthesia of right foot 11/08/2019   Morbid obesity due to excess calories ([place]) c/b hbp/ dvt/PE 09/10/2019   DVT (deep venous thrombosis) ([place]) 08/30/2019   Multiple pulmonary emboli ([place]) 08/22/2019   Depression 06/24/2017   GAD (generalized anxiety disorder) 08/06/2016   Dyslipidemia 09/18/2015   Hypertensive heart disease 09/23/2014   Nutcracker esophagus 09/21/2014   Lung nodule 09/21/2014    Past Medical History:  Diagnosis Date   Acute nonintractable headache 09/06/2019    Acute respiratory failure with hypoxia ([place]) 08/30/2019   AKI (acute kidney injury) ([place]) 08/30/2019   Antiphospholipid syndrome ([place]) 10/30/2019   Anxiety    APS (antiphospholipid syndrome) ([place])    Arthritis    Colon polyp    ? hyperplastic   Depression    Diarrhea 05/21/2020   DVT (deep venous thrombosis) ([place])    Dyspnea on exertion 09/08/2017   "Quit smoking" 2015 with onset of symptoms in 2016  Spirometry 09/08/2017  Flat f/v loop  - d/c acei 09/08/2017  - 01/04/2020   Walked RA x two laps =  approx 579f @ fast pace - stopped due to end of study/ min sob with sats of 93 % at the end of the study. - PFT's  03/08/20   FEV1 3.84 (83 % ) ratio 0..90  p 3 % improvement from saba p ? 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 06/26/20, 03/08/20   Snoring 07/09/2020   Stroke ([place])    Upper airway cough syndrome 01/05/2020   Onset mid Jan 2021 while on otc PPI  - max rx for gerd 01/04/2020 >>>      Witnessed apneic spells 07/09/2020    Family History  Problem Relation Age of Onset   Diabetes Mother    Hyperlipidemia Mother    Hypertension Mother    Thyroid  disease Mother        uncertain type--had surg--no cancer   Heart disease Father 81   Hyperlipidemia Father    Hypertension Father    Prostate cancer Father 36   Lung cancer Father 39       Dx 06/18/2017   Colon polyps Father    Irritable bowel syndrome Father    Diverticulitis Father    Diabetes Sister    Hyperlipidemia Sister    Hypertension Sister    Diabetes Maternal Grandmother    Heart disease Maternal Grandmother    Hyperlipidemia Maternal Grandmother    Hypertension Maternal Grandmother    Colon cancer Maternal Grandmother    Heart disease Maternal Grandfather    Hyperlipidemia Maternal Grandfather    Hypertension Maternal Grandfather    Stroke Maternal Grandfather    Liver cancer Maternal Grandfather    Irritable bowel syndrome Maternal Grandfather    Heart disease Paternal Grandmother    Healthy Son    Healthy Daughter    Healthy Daughter    Past Surgical History:  Procedure Laterality Date   BRONCHIAL WASHINGS  01/14/2021   Procedure: BRONCHIAL WASHINGS;  Surgeon: Freddi Starr, MD;  Location: [facility];  Service: Pulmonary;;   CHOLECYSTECTOMY     VIDEO BRONCHOSCOPY N/A 01/14/2021   Procedure: VIDEO BRONCHOSCOPY WITHOUT FLUORO;  Surgeon: Freddi Starr, MD;  Location: [facility];  Service: Pulmonary;  Laterality: N/A;   Social History   Social History Narrative   Lives with wife and daughter at home   Right Handed   Drinks 4 cups caffeine daily   Immunization History  Administered Date(s) Administered   Influenza Inj Mdck Quad Pf 12/13/2020   Influenza,inj,Quad PF,6+ Mos 09/14/2016, 09/08/2017, 09/06/2019   Influenza,inj,quad, With Preservative 09/18/2015   PFIZER(Purple Top)SARS-COV-2 Vaccination 02/19/2020, 03/04/2020, 12/13/2020, 07/03/2021   Td 11/11/2016   Tdap 02/28/2019     Objective: Vital Signs: There were no vitals taken for this visit.   Physical Exam   Musculoskeletal Exam: ***  CDAI Exam: CDAI Score: -- Patient Global:  --; Provider Global: -- Swollen: --; Tender: -- Joint Exam 07/29/2021   No joint exam has been documented for this visit   There is currently no information documented on the homunculus. Go to the Rheumatology activity and complete the homunculus joint exam.  Investigation: No additional findings.  Imaging: VAS [place] TRANSCRANIAL DOPPLER W BUBBLES  Result Date: 07/03/2021  Transcranial Doppler with Bubble Patient Name:  LEDFORD PASTOR  Date of Exam:   07/02/2021 Medical Rec #: [id number]            Accession #:    [id number] Date of Birth: 1970-12-12             Patient Gender: M Patient Age:   50Y Exam [facility] Procedure:      VAS [place] TRANSCRANIAL Candise Bowens Referring Phys: [place] --------------------------------------------------------------------------------  Indications: Stroke. Limitations: Respiratory interference w/ valsalva. Comparison Study: 03-22-2021 Prior echo w/ bubble showed no intracardiac                   shunting. Performing Technologist: Darlin Coco RDMS,RVT  Examination Guidelines: A complete evaluation includes B-mode imaging, spectral Doppler, color Doppler, and power Doppler as needed of all accessible portions of each vessel. Bilateral testing is considered an integral part of a complete examination. Limited examinations for reoccurring indications may be performed as noted.  Summary: No HITS at rest. Less than 5 HITS heard with Valsalva.  A vascular evaluation was performed. The right Terminal ICA was studied. An IV was inserted into the patient's right antecubital. Verbal informed consent was obtained.  Weakly Positive TCD Bubble study with valasalva onlyindicative of a trivial right to left shunt *See table(s) above for TCD measurements and observations.  Diagnosing physician: Antony Contras MD Electronically signed by Antony Contras MD on 07/03/2021 at 12:18:24 PM.    Final     Recent Labs: Lab Results  Component Value Date   WBC 9.5  05/27/2021   HGB 14.8 05/27/2021   PLT 317.0 05/27/2021   NA 136 05/27/2021   K 3.9 05/27/2021   CL 99 05/27/2021   CO2 30 05/27/2021   GLUCOSE 102 (H) 05/27/2021   BUN 14 05/27/2021   CREATININE 1.24 05/27/2021   BILITOT 0.7 05/27/2021   ALKPHOS 61 05/27/2021   AST 22 05/27/2021   ALT 30 05/27/2021   PROT 7.7 05/27/2021   ALBUMIN 4.4 05/27/2021   CALCIUM 10.0 05/27/2021   GFRAA 84 10/14/2020   QFTBGOLDPLUS Negative 01/15/2021    Speciality Comments: Plaquenil 200 mg p.o. twice daily started March 26, 2021  Procedures:  No procedures performed Allergies: Diltiazem hcl   Assessment / Plan:     Visit Diagnoses: No diagnosis found.  Orders: No orders of the defined types were placed in this encounter.  No orders of the defined types were placed in this encounter.   Face-to-face time spent with patient was *** minutes. Greater than 50% of time was spent in counseling and coordination of care.  Follow-Up Instructions: No follow-ups on file.   Earnestine Mealing, CMA  Note - This record has been created using Editor, commissioning.  Chart creation errors have been sought, but may not always  have been located. Such creation errors do not reflect on  the standard of medical care.

## 2021-07-15 NOTE — Patient Instructions (Signed)
It was great to see you!  Let us know if you need anything!  Let's follow-up in 3-6 months, sooner if you have concerns.  Take care,  Inda Coke PA-C

## 2021-07-18 ENCOUNTER — Inpatient Hospital Stay: Payer: BC Managed Care – PPO | Attending: Family

## 2021-07-18 ENCOUNTER — Inpatient Hospital Stay: Payer: BC Managed Care – PPO | Admitting: Hematology & Oncology

## 2021-07-23 ENCOUNTER — Ambulatory Visit: Payer: BC Managed Care – PPO | Admitting: Endocrinology

## 2021-07-23 ENCOUNTER — Other Ambulatory Visit: Payer: Self-pay

## 2021-07-23 ENCOUNTER — Ambulatory Visit (INDEPENDENT_AMBULATORY_CARE_PROVIDER_SITE_OTHER): Payer: BC Managed Care – PPO

## 2021-07-23 DIAGNOSIS — Z7901 Long term (current) use of anticoagulants: Secondary | ICD-10-CM

## 2021-07-23 LAB — POCT INR: INR: 2.3 (ref 2.0–3.0)

## 2021-07-23 NOTE — Progress Notes (Signed)
Office Visit Note  Patient: Wesley Hancock             Date of Birth: 19-Mar-1971           MRN: [id number]             PCP: Inda Coke, PA Referring: Inda Coke, PA Visit Date: 07/24/2021 Occupation: '@GUAROCC'$ @  Subjective:  Other (Patient reports itching all over and back pain )   History of Present Illness: Wesley Hancock is a 50 y.o. male with history of mixed connective tissue disease.  He states that he has been having lower back pain recently which radiates into his bilateral buttock area.  He also gives history of discomfort in the trochanteric area.  He states he has difficulty sleeping on his side bilaterally.  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  He denies any joint swelling.  He states recently has been experiencing itching all over his body.  He has not noticed any rash.  He has tried different products without much results.  He has been taking CellCept on a regular basis.  He also takes Bactrim for prophylaxis.  He is on chronic warfarin therapy by hematology.  Activities of Daily Living:  Patient reports morning stiffness for all day. Patient Reports nocturnal pain.  Difficulty dressing/grooming: Denies Difficulty climbing stairs: Denies Difficulty getting out of chair: Denies Difficulty using hands for taps, buttons, cutlery, and/or writing: Denies  Review of Systems  Constitutional:  Negative for fatigue.  HENT:  Negative for mouth sores, mouth dryness and nose dryness.   Eyes:  Negative for pain, itching and dryness.  Respiratory:  Positive for shortness of breath. Negative for difficulty breathing.   Cardiovascular:  Negative for chest pain and palpitations.  Gastrointestinal:  Negative for blood in stool, constipation and diarrhea.  Endocrine: Negative for increased urination.  Genitourinary:  Negative for difficulty urinating.  Musculoskeletal:  Positive for joint pain, joint pain and morning stiffness. Negative for joint  swelling, myalgias, muscle tenderness and myalgias.  Skin:  Positive for rash. Negative for color change and redness.       Itching   Allergic/Immunologic: Negative for susceptible to infections.  Neurological:  Negative for dizziness, numbness, headaches and weakness.  Hematological:  Negative for bruising/bleeding tendency.  Psychiatric/Behavioral:  Negative for sleep disturbance.    PMFS History:  Patient Active Problem List   Diagnosis Date Noted   ILD (interstitial lung disease) ([place]) 05/27/2021   Long term (current) use of anticoagulants 03/26/2021   CVA (cerebral vascular accident) ([place]) 03/20/2021   Mixed connective tissue disease ([place]) 01/21/2021   Hyperglycemia 01/16/2021   Multinodular goiter 01/10/2021   OSA on CPAP 10/11/2020   Esophageal spasm    Colon polyp    Arthritis    APS (antiphospholipid syndrome) ([name])    Anxiety    CKD (chronic kidney disease) stage 2, GFR 60-89 ml/min 07/11/2020   Paresthesia of right foot 11/08/2019   Morbid obesity due to excess calories ([place]) c/b hbp/ dvt/PE 09/10/2019   DVT (deep venous thrombosis) ([place]) 08/30/2019   Multiple pulmonary emboli ([place]) 08/22/2019   Depression 06/24/2017   Dyslipidemia 09/18/2015   Hypertensive heart disease 09/23/2014   Nutcracker esophagus 09/21/2014   Lung nodule 09/21/2014    Past Medical History:  Diagnosis Date   Acute nonintractable headache 09/06/2019   Acute respiratory failure with hypoxia ([place]) 08/30/2019   AKI (acute kidney injury) ([place]) 08/30/2019   Antiphospholipid syndrome ([place]) 10/30/2019  Anxiety    APS (antiphospholipid syndrome) ([name])    Arthritis    Colon polyp    ? hyperplastic   Depression    Diarrhea 05/21/2020   DVT (deep venous thrombosis) ([name])    Dyspnea on exertion 09/08/2017   "Quit smoking" 2015 with onset of symptoms in 2016  Spirometry 09/08/2017  Flat f/v loop  - d/c acei 09/08/2017  - 01/04/2020   Walked RA x two laps =  approx 570f @ fast pace - stopped due to  end of study/ min sob with sats of 93 % at the end of the study. - PFT's  03/08/20   FEV1 3.84 (83 % ) ratio 0..90  p 3 % improvement from saba p ? 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 06/26/20, 03/08/20   Snoring 07/09/2020   Stroke ([place]    Upper airway cough syndrome 01/05/2020   Onset mid Jan 2021 while on otc PPI  - max rx for gerd 01/04/2020 >>>      Witnessed apneic spells 07/09/2020    Family History  Problem Relation Age of Onset   Diabetes Mother    Hyperlipidemia Mother    Hypertension Mother    Thyroid disease Mother        uncertain type--had surg--no cancer   Heart disease Father 46  Hyperlipidemia Father    Hypertension Father    Prostate cancer Father 661  Lung cancer Father 721      Dx 06/18/2017   Colon polyps Father    Irritable bowel syndrome Father    Diverticulitis Father    Diabetes Sister    Hyperlipidemia Sister    Hypertension Sister    Diabetes Maternal Grandmother    Heart disease Maternal Grandmother    Hyperlipidemia Maternal Grandmother    Hypertension Maternal Grandmother    Colon cancer Maternal Grandmother     Heart disease Maternal Grandfather    Hyperlipidemia Maternal Grandfather    Hypertension Maternal Grandfather    Stroke Maternal Grandfather    Liver cancer Maternal Grandfather    Irritable bowel syndrome Maternal Grandfather    Heart disease Paternal Grandmother    Healthy Son    Healthy Daughter    Healthy Daughter    Past Surgical History:  Procedure Laterality Date   BRONCHIAL WASHINGS  01/14/2021   Procedure: BRONCHIAL WASHINGS;  Surgeon: DFreddi Starr MD;  [facility];  Service: Pulmonary;;   CHOLECYSTECTOMY     VIDEO BRONCHOSCOPY N/A 01/14/2021   Procedure: VIDEO BRONCHOSCOPY WITHOUT FLUORO;  Surgeon: DFreddi Starr MD;  [facility];  Service: Pulmonary;  Laterality: N/A;   Social History   Social History Narrative   Lives with wife and daughter at home   Right Handed   Drinks 4 cups caffeine daily   Immunization History  Administered Date(s) Administered   Influenza Inj Mdck Quad Pf 12/13/2020   Influenza,inj,Quad PF,6+ Mos 09/14/2016, 09/08/2017, 09/06/2019   Influenza,inj,quad, With Preservative 09/18/2015   PFIZER(Purple Top)SARS-COV-2 Vaccination 02/19/2020, 03/04/2020, 12/13/2020, 07/03/2021   Td 11/11/2016   Tdap 02/28/2019     Objective: Vital Signs: BP 131/87 (BP Location: Left Arm, Patient Position: Sitting, Cuff Size: Large)   Pulse 80   Ht '6\' 2"'$  (1.88 m)   Wt 294 lb (133.4 kg)   BMI 37.75 kg/m    Physical Exam Vitals and nursing note reviewed.  Constitutional:      Appearance: He is well-developed.  HENT:     Head: Normocephalic and atraumatic.  Eyes:     Conjunctiva/sclera: Conjunctivae normal.     Pupils: Pupils are equal, round, and reactive to light.  Cardiovascular:     Rate and Rhythm: Normal rate and regular rhythm.     Heart sounds: Normal heart sounds.  Pulmonary:     Effort: Pulmonary effort is normal.     Breath sounds: Normal breath sounds.  Abdominal:     General: Bowel sounds are normal.      Palpations: Abdomen is soft.  Musculoskeletal:     Cervical back: Normal range of motion and neck supple.  Skin:    General: Skin is warm and dry.     Capillary Refill: Capillary refill takes less than 2 seconds.  Neurological:     Mental Status: He is alert and oriented to person, place, and time.  Psychiatric:        Behavior: Behavior normal.     Musculoskeletal Exam: C-spine was in good range of motion.  Thoracic and lumbar spine was in fairly good range of motion.  He had discomfort in the lower lumbar region.  Shoulder joints, elbow joints, wrist joints, MCPs PIPs and DIPs with good range of motion with no synovitis.  Hip joints, knee joints, ankles, and MTPs with good range of motion with no synovitis.  CDAI Exam: CDAI Score: -- Patient Global: --; Provider Global: -- Swollen: --; Tender: -- Joint Exam 07/24/2021   No joint exam has been documented for this visit   There is currently no information documented on the homunculus. Go to the Rheumatology activity and complete the homunculus joint exam.  Investigation: No additional findings.  Imaging: VAS [place] TRANSCRANIAL DOPPLER W BUBBLES  Result Date: 07/03/2021  Transcranial Doppler with Bubble Patient Name:  ZEBULUN BLYTHE  Date of Exam:   07/02/2021 Medical Rec #: [id number]            Accession #:    [id number] Date of Birth: 04/26/1971             Patient Gender: M Patient Age:   50Y Exam Location:  [facility] Procedure:      VAS [place] TRANSCRANIAL Candise Bowens Referring Phys: [id number] --------------------------------------------------------------------------------  Indications: Stroke. Limitations: Respiratory interference w/ valsalva. Comparison Study: 03-22-2021 Prior echo w/ bubble showed no intracardiac                   shunting. Performing Technologist: Darlin Coco RDMS,RVT  Examination Guidelines: A complete evaluation includes B-mode imaging, spectral Doppler, color Doppler, and power  Doppler as needed of all accessible portions  of each vessel. Bilateral testing is considered an integral part of a complete examination. Limited examinations for reoccurring indications may be performed as noted.  Summary: No HITS at rest. Less than 5 HITS heard with Valsalva.  A vascular evaluation was performed. The right Terminal ICA was studied. An IV was inserted into the patient's right antecubital. Verbal informed consent was obtained.  Weakly Positive TCD Bubble study with valasalva onlyindicative of a trivial right to left shunt *See table(s) above for TCD measurements and observations.  Diagnosing physician: Antony Contras MD Electronically signed by Antony Contras MD on 07/03/2021 at 12:18:24 PM.    Final     Recent Labs: Lab Results  Component Value Date   WBC 9.5 05/27/2021   HGB 14.8 05/27/2021   PLT 317.0 05/27/2021   NA 136 05/27/2021   K 3.9 05/27/2021   CL 99 05/27/2021   CO2 30 05/27/2021   GLUCOSE 102 (H) 05/27/2021   BUN 14 05/27/2021   CREATININE 1.24 05/27/2021   BILITOT 0.7 05/27/2021   ALKPHOS 61 05/27/2021   AST 22 05/27/2021   ALT 30 05/27/2021   PROT 7.7 05/27/2021   ALBUMIN 4.4 05/27/2021   CALCIUM 10.0 05/27/2021   GFRAA 84 10/14/2020   QFTBGOLDPLUS Negative 01/15/2021    May 25, 2022AVISE lupus index -1.5, ANA 1: 320, CB-CAP Positive, ENA negative, Jo 1 negative, anticardiolipin IgG and IgA positive, beta-2 GP 1 IgG and IgM positive, antiphosphatidylserine IgG and IgM positive, antihistone weak positive, rheumatoid factor negative, anti-CCP negative,anti-carP negative, antithyroglobulin negative, anti-TPO negative  Speciality Comments: Plaquenil 200 mg p.o. twice daily started March 26, 2021  Procedures:  No procedures performed Allergies: Diltiazem hcl   Assessment / Plan:     Visit Diagnoses: Mixed connective tissue disease ([place]) - +ANA,+dsDNA,+RNP,+aCL,+LA, +beta-2 GP 1, +aPS. H/o CVA,PE,DVTs,ILD,arthralgias. He denies any history of oral ulcers, nasal  ulcers, malar rash, photosensitivity, Raynaud's phenomenon.  ILD (interstitial lung disease) ([place]) - He is on CellCept by Dr. Erin Fulling.  CT chest showed bilateral groundglass infiltrates.  APS (antiphospholipid syndrome) ([name]) -he has positive lupus anticoagulant, antiphosphatidylserine, positive anticardiolipin, positive beta-2 GP 1.  AVISE labs were obtained recently which I reviewed with the patient today.  He is on Coumadin by hematology.  He is aware that he will need Coumadin therapy lifelong.  He also understands the side effects of Coumadin.  I plan to repeat AVISE labs every 6 months.  High risk medication use -  CellCept 1 g p.o. twice daily, Bactrim prophylaxis, prednisone 30 mg p.o. daily.  Plaquenil 200 mg p.o. twice daily started March 26, 2021.  His labs from May 27, 2021 included CBC with differential and CMP with GFR within normal limits.  Chronic midline low back pain with bilateral sciatica-he has been experiencing lower back pain radiating to his bilateral gluteal region.  He had some tenderness on palpation over lower lumbar region.  X-ray of the lumbar spine showed mild spondylosis and possible old T12 and L1 fracture with wedging of the vertebrae and facet joint arthropathy.  No SI joint changes were noted.  I will refer him to physical therapy for core strengthening exercises.  Bilateral trochanteric bursitis-he has been experiencing pain and discomfort over bilateral trochanteric bursa.  IT band stretches were discussed.  We will refer him to physical therapy.  Cerebrovascular accident (CVA), unspecified mechanism ([place]) - He was started on Coumadin in April 2022 after CVA.  He developed expressive aphasia.  Multiple pulmonary emboli [facility]) - September 2019.  Deep  vein thrombosis (DVT) of right lower extremity, unspecified chronicity, unspecified vein ([name])  Pulmonary infiltrates  Hypertensive heart disease without heart failure  Mixed hyperlipidemia  Nutcracker  esophagus  Esophageal spasm  GAD (generalized anxiety disorder)  OSA on CPAP  Other fatigue  Multinodular goiter  Orders: No orders of the defined types were placed in this encounter.  No orders of the defined types were placed in this encounter.    Follow-Up Instructions: Return in about 5 months (around 12/24/2021) for MCTD.   Bo Merino, MD  Note - This record has been created using Editor, commissioning.  Chart creation errors have been sought, but may not always  have been located. Such creation errors do not reflect on  the standard of medical care.

## 2021-07-23 NOTE — Patient Instructions (Addendum)
Pre visit review using our clinic review tool, if applicable. No additional management support is needed unless otherwise documented below in the visit note.  Take 15 mg today and then continue to take 2 tablets every day except 3 tablets on Monday and Thursdays.  Re-check in 4 weeks.

## 2021-07-24 ENCOUNTER — Encounter: Payer: Self-pay | Admitting: Rheumatology

## 2021-07-24 ENCOUNTER — Ambulatory Visit (INDEPENDENT_AMBULATORY_CARE_PROVIDER_SITE_OTHER): Payer: BC Managed Care – PPO | Admitting: Rheumatology

## 2021-07-24 ENCOUNTER — Encounter: Payer: Self-pay | Admitting: Radiology

## 2021-07-24 ENCOUNTER — Ambulatory Visit: Payer: Self-pay

## 2021-07-24 VITALS — BP 131/87 | HR 80 | Ht 74.0 in | Wt 294.0 lb

## 2021-07-24 DIAGNOSIS — M5442 Lumbago with sciatica, left side: Secondary | ICD-10-CM | POA: Diagnosis not present

## 2021-07-24 DIAGNOSIS — D6861 Antiphospholipid syndrome: Secondary | ICD-10-CM | POA: Diagnosis not present

## 2021-07-24 DIAGNOSIS — G4733 Obstructive sleep apnea (adult) (pediatric): Secondary | ICD-10-CM

## 2021-07-24 DIAGNOSIS — G8929 Other chronic pain: Secondary | ICD-10-CM

## 2021-07-24 DIAGNOSIS — L299 Pruritus, unspecified: Secondary | ICD-10-CM

## 2021-07-24 DIAGNOSIS — K224 Dyskinesia of esophagus: Secondary | ICD-10-CM

## 2021-07-24 DIAGNOSIS — F411 Generalized anxiety disorder: Secondary | ICD-10-CM

## 2021-07-24 DIAGNOSIS — M7062 Trochanteric bursitis, left hip: Secondary | ICD-10-CM

## 2021-07-24 DIAGNOSIS — E042 Nontoxic multinodular goiter: Secondary | ICD-10-CM

## 2021-07-24 DIAGNOSIS — I639 Cerebral infarction, unspecified: Secondary | ICD-10-CM

## 2021-07-24 DIAGNOSIS — E782 Mixed hyperlipidemia: Secondary | ICD-10-CM

## 2021-07-24 DIAGNOSIS — M7061 Trochanteric bursitis, right hip: Secondary | ICD-10-CM

## 2021-07-24 DIAGNOSIS — M351 Other overlap syndromes: Secondary | ICD-10-CM

## 2021-07-24 DIAGNOSIS — Z79899 Other long term (current) drug therapy: Secondary | ICD-10-CM

## 2021-07-24 DIAGNOSIS — Z9989 Dependence on other enabling machines and devices: Secondary | ICD-10-CM

## 2021-07-24 DIAGNOSIS — R918 Other nonspecific abnormal finding of lung field: Secondary | ICD-10-CM

## 2021-07-24 DIAGNOSIS — M5441 Lumbago with sciatica, right side: Secondary | ICD-10-CM | POA: Diagnosis not present

## 2021-07-24 DIAGNOSIS — I2699 Other pulmonary embolism without acute cor pulmonale: Secondary | ICD-10-CM

## 2021-07-24 DIAGNOSIS — J849 Interstitial pulmonary disease, unspecified: Secondary | ICD-10-CM

## 2021-07-24 DIAGNOSIS — I119 Hypertensive heart disease without heart failure: Secondary | ICD-10-CM

## 2021-07-24 DIAGNOSIS — I82401 Acute embolism and thrombosis of unspecified deep veins of right lower extremity: Secondary | ICD-10-CM

## 2021-07-24 DIAGNOSIS — R5383 Other fatigue: Secondary | ICD-10-CM

## 2021-07-24 NOTE — Patient Instructions (Signed)
Iliotibial Band Syndrome Rehab Ask your health care provider which exercises are safe for you. Do exercises exactly as told by your health care provider and adjust them as directed. It is normal to feel mild stretching, pulling, tightness, or discomfort as you do these exercises. Stop right away if you feel sudden pain or your pain gets significantly worse. Do not begin these exercises until told by your health care provider. Stretching and range-of-motion exercises These exercises warm up your muscles and joints and improve the movement andflexibility of your hip and pelvis. Quadriceps stretch, prone  Lie on your abdomen (prone position) on a firm surface, such as a bed or padded floor. Bend your left / right knee and reach back to hold your ankle or pant leg. If you cannot reach your ankle or pant leg, loop a belt around your foot and grab the belt instead. Gently pull your heel toward your buttocks. Your knee should not slide out to the side. You should feel a stretch in the front of your thigh and knee (quadriceps). Hold this position for __________ seconds. Repeat __________ times. Complete this exercise __________ times a day. Iliotibial band stretch An iliotibial band is a strong band of muscle tissue that runs from the outer side of your hip to the outer side of your thigh and knee. Lie on your side with your left / right leg in the top position. Bend both of your knees and grab your left / right ankle. Stretch out your bottom arm to help you balance. Slowly bring your top knee back so your thigh goes behind your trunk. Slowly lower your top leg toward the floor until you feel a gentle stretch on the outside of your left / right hip and thigh. If you do not feel a stretch and your knee will not fall farther, place the heel of your other foot on top of your knee and pull your knee down toward the floor with your foot. Hold this position for __________ seconds. Repeat __________ times.  Complete this exercise __________ times a day. Strengthening exercises These exercises build strength and endurance in your hip and pelvis. Enduranceis the ability to use your muscles for a long time, even after they get tired. Straight leg raises, side-lying This exercise strengthens the muscles that rotate the leg at the hip and move it away from your body (hip abductors). Lie on your side with your left / right leg in the top position. Lie so your head, shoulder, hip, and knee line up. You may bend your bottom knee to help you balance. Roll your hips slightly forward so your hips are stacked directly over each other and your left / right knee is facing forward. Tense the muscles in your outer thigh and lift your top leg 4-6 inches (10-15 cm). Hold this position for __________ seconds. Slowly lower your leg to return to the starting position. Let your muscles relax completely before doing another repetition. Repeat __________ times. Complete this exercise __________ times a day. Leg raises, prone This exercise strengthens the muscles that move the hips backward (hip extensors). Lie on your abdomen (prone position) on your bed or a firm surface. You can put a pillow under your hips if that is more comfortable for your lower back. Bend your left / right knee so your foot is straight up in the air. Squeeze your buttocks muscles and lift your left / right thigh off the bed. Do not let your back arch. Tense your thigh   muscle as hard as you can without increasing any knee pain. Hold this position for __________ seconds. Slowly lower your leg to return to the starting position and allow it to relax completely. Repeat __________ times. Complete this exercise __________ times a day. Hip hike Stand sideways on a bottom step. Stand on your left / right leg with your other foot unsupported next to the step. You can hold on to a railing or wall for balance if needed. Keep your knees straight and your  torso square. Then lift your left / right hip up toward the ceiling. Slowly let your left / right hip lower toward the floor, past the starting position. Your foot should get closer to the floor. Do not lean or bend your knees. Repeat __________ times. Complete this exercise __________ times a day. This information is not intended to replace advice given to you by your health care provider. Make sure you discuss any questions you have with your healthcare provider. Document Revised: 01/24/2020 Document Reviewed: 01/24/2020 Elsevier Patient Education  [place]. Back Exercises The following exercises strengthen the muscles that help to support the trunk and back. They also help to keep the lower back flexible. Doing these exercises can help to prevent back pain or lessen existing pain. If you have back pain or discomfort, try doing these exercises 2-3 times each day or as told by your health care provider. As your pain improves, do them once each day, but increase the number of times that you repeat the steps for each exercise (do more repetitions). To prevent the recurrence of back pain, continue to do these exercises once each day or as told by your health care provider. Do exercises exactly as told by your health care provider and adjust them as directed. It is normal to feel mild stretching, pulling, tightness, or discomfort as you do these exercises, but you should stop right away if youfeel sudden pain or your pain gets worse. Exercises Single knee to chest Repeat these steps 3-5 times for each leg: Lie on your back on a firm bed or the floor with your legs extended. Bring one knee to your chest. Your other leg should stay extended and in contact with the floor. Hold your knee in place by grabbing your knee or thigh with both hands and hold. Pull on your knee until you feel a gentle stretch in your lower back or buttocks. Hold the stretch for 10-30 seconds. Slowly release and  straighten your leg. Pelvic tilt Repeat these steps 5-10 times: Lie on your back on a firm bed or the floor with your legs extended. Bend your knees so they are pointing toward the ceiling and your feet are flat on the floor. Tighten your lower abdominal muscles to press your lower back against the floor. This motion will tilt your pelvis so your tailbone points up toward the ceiling instead of pointing to your feet or the floor. With gentle tension and even breathing, hold this position for 5-10 seconds. Cat-cow Repeat these steps until your lower back becomes more flexible: Get into a hands-and-knees position on a firm surface. Keep your hands under your shoulders, and keep your knees under your hips. You may place padding under your knees for comfort. Let your head hang down toward your chest. Contract your abdominal muscles and point your tailbone toward the floor so your lower back becomes rounded like the back of a cat. Hold this position for 5 seconds. Slowly lift your head, let  your abdominal muscles relax and point your tailbone up toward the ceiling so your back forms a sagging arch like the back of a cow. Hold this position for 5 seconds.  Press-ups Repeat these steps 5-10 times: Lie on your abdomen (face-down) on the floor. Place your palms near your head, about shoulder-width apart. Keeping your back as relaxed as possible and keeping your hips on the floor, slowly straighten your arms to raise the top half of your body and lift your shoulders. Do not use your back muscles to raise your upper torso. You may adjust the placement of your hands to make yourself more comfortable. Hold this position for 5 seconds while you keep your back relaxed. Slowly return to lying flat on the floor.  Bridges Repeat these steps 10 times: Lie on your back on a firm surface. Bend your knees so they are pointing toward the ceiling and your feet are flat on the floor. Your arms should be flat at  your sides, next to your body. Tighten your buttocks muscles and lift your buttocks off the floor until your waist is at almost the same height as your knees. You should feel the muscles working in your buttocks and the back of your thighs. If you do not feel these muscles, slide your feet 1-2 inches farther away from your buttocks. Hold this position for 3-5 seconds. Slowly lower your hips to the starting position, and allow your buttocks muscles to relax completely. If this exercise is too easy, try doing it with your arms crossed over yourchest. Abdominal crunches Repeat these steps 5-10 times: Lie on your back on a firm bed or the floor with your legs extended. Bend your knees so they are pointing toward the ceiling and your feet are flat on the floor. Cross your arms over your chest. Tip your chin slightly toward your chest without bending your neck. Tighten your abdominal muscles and slowly raise your trunk (torso) high enough to lift your shoulder blades a tiny bit off the floor. Avoid raising your torso higher than that because it can put too much stress on your low back and does not help to strengthen your abdominal muscles. Slowly return to your starting position. Back lifts Repeat these steps 5-10 times: Lie on your abdomen (face-down) with your arms at your sides, and rest your forehead on the floor. Tighten the muscles in your legs and your buttocks. Slowly lift your chest off the floor while you keep your hips pressed to the floor. Keep the back of your head in line with the curve in your back. Your eyes should be looking at the floor. Hold this position for 3-5 seconds. Slowly return to your starting position. Contact a health care provider if: Your back pain or discomfort gets much worse when you do an exercise. Your worsening back pain or discomfort does not lessen within 2 hours after you exercise. If you have any of these problems, stop doing these exercises right away. Do  not do them again unless your health care provider says that you can. Get help right away if: You develop sudden, severe back pain. If this happens, stop doing the exercises right away. Do not do them again unless your health care provider says that you can. This information is not intended to replace advice given to you by your health care provider. Make sure you discuss any questions you have with your healthcare provider. Document Revised: 03/23/2019 Document Reviewed: 08/18/2018 Elsevier Patient Education  2022 Elsevier  Inc.  

## 2021-07-27 DIAGNOSIS — G4733 Obstructive sleep apnea (adult) (pediatric): Secondary | ICD-10-CM | POA: Diagnosis not present

## 2021-07-29 ENCOUNTER — Ambulatory Visit: Payer: BC Managed Care – PPO | Admitting: Rheumatology

## 2021-07-29 DIAGNOSIS — I2699 Other pulmonary embolism without acute cor pulmonale: Secondary | ICD-10-CM

## 2021-07-29 DIAGNOSIS — G4733 Obstructive sleep apnea (adult) (pediatric): Secondary | ICD-10-CM

## 2021-07-29 DIAGNOSIS — E042 Nontoxic multinodular goiter: Secondary | ICD-10-CM

## 2021-07-29 DIAGNOSIS — J849 Interstitial pulmonary disease, unspecified: Secondary | ICD-10-CM

## 2021-07-29 DIAGNOSIS — D6861 Antiphospholipid syndrome: Secondary | ICD-10-CM

## 2021-07-29 DIAGNOSIS — I82401 Acute embolism and thrombosis of unspecified deep veins of right lower extremity: Secondary | ICD-10-CM

## 2021-07-29 DIAGNOSIS — F411 Generalized anxiety disorder: Secondary | ICD-10-CM

## 2021-07-29 DIAGNOSIS — Z79899 Other long term (current) drug therapy: Secondary | ICD-10-CM

## 2021-07-29 DIAGNOSIS — E782 Mixed hyperlipidemia: Secondary | ICD-10-CM

## 2021-07-29 DIAGNOSIS — I639 Cerebral infarction, unspecified: Secondary | ICD-10-CM

## 2021-07-29 DIAGNOSIS — K224 Dyskinesia of esophagus: Secondary | ICD-10-CM

## 2021-07-29 DIAGNOSIS — M351 Other overlap syndromes: Secondary | ICD-10-CM

## 2021-07-29 DIAGNOSIS — R918 Other nonspecific abnormal finding of lung field: Secondary | ICD-10-CM

## 2021-07-29 DIAGNOSIS — I119 Hypertensive heart disease without heart failure: Secondary | ICD-10-CM

## 2021-08-07 ENCOUNTER — Inpatient Hospital Stay: Admission: RE | Admit: 2021-08-07 | Payer: BC Managed Care – PPO | Source: Ambulatory Visit

## 2021-08-17 NOTE — Therapy (Signed)
OUTPATIENT PHYSICAL THERAPY THORACOLUMBAR EVALUATION   Patient Name: Wesley Hancock MRN: [id number] DOB:1971-07-31, 50 y.o., male Today's Date: 08/17/2021  PCP: Inda Coke, PA REFERRING PROVIDER: Bo Merino, MD    Past Medical History:  Diagnosis Date   Acute nonintractable headache 09/06/2019   Acute respiratory failure with hypoxia ([place]) 08/30/2019   AKI (acute kidney injury) ([place]) 08/30/2019   Antiphospholipid syndrome ([place]) 10/30/2019   Anxiety    APS (antiphospholipid syndrome) ([place])    Arthritis    Colon polyp    ? hyperplastic   Depression    Diarrhea 05/21/2020   DVT (deep venous thrombosis) ([place])    Dyspnea on exertion 09/08/2017   "Quit smoking" 2015 with onset of symptoms in 2016  Spirometry 09/08/2017  Flat f/v loop  - d/c acei 09/08/2017  - 01/04/2020   Walked RA x two laps =  approx 567f @ fast pace - stopped due to end of study/ min sob with sats of 93 % at the end of the study. - PFT's  03/08/20   FEV1 3.84 (83 % ) ratio 0..90  p 3 % improvement from saba p ? 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 06/26/20,  03/08/20   Snoring 07/09/2020   Stroke ([facility]    Upper airway cough syndrome 01/05/2020   Onset mid Jan 2021 while on otc PPI  - max rx for gerd 01/04/2020 >>>      Witnessed apneic spells 07/09/2020   Past Surgical History:  Procedure Laterality Date   BRONCHIAL WASHINGS  01/14/2021   Procedure: BRONCHIAL WASHINGS;  Surgeon: DFreddi Starr MD;  [facility];  Service: Pulmonary;;   CHOLECYSTECTOMY     VIDEO BRONCHOSCOPY N/A 01/14/2021   Procedure: VIDEO BRONCHOSCOPY WITHOUT FLUORO;  Surgeon: DFreddi Starr MD;  Location: [facility];  Service: Pulmonary;  Laterality: N/A;   Patient Active Problem List   Diagnosis Date Noted   ILD (interstitial lung disease) ([facility] 05/27/2021   Long term (current) use of anticoagulants 03/26/2021   CVA (cerebral vascular accident) ([facility] 03/20/2021   Mixed connective tissue disease ([facility] 01/21/2021   Hyperglycemia 01/16/2021   Multinodular goiter 01/10/2021   OSA on CPAP 10/11/2020   Esophageal spasm    Colon polyp    Arthritis    APS (antiphospholipid syndrome) ([name])    Anxiety    CKD (chronic kidney disease) stage 2, GFR 60-89 ml/min 07/11/2020   Paresthesia of right foot 11/08/2019   Morbid obesity due to excess calories ([facility] c/b hbp/ dvt/PE 09/10/2019   DVT (deep venous thrombosis) ([facility] 08/30/2019   Multiple pulmonary emboli (  [place]) 08/22/2019   Depression 06/24/2017   Dyslipidemia 09/18/2015   Hypertensive heart disease 09/23/2014   Nutcracker esophagus 09/21/2014   Lung nodule 09/21/2014    ONSET DATE: about 2 months ago   REFERRING DIAG:  Diagnosis  M54.41,M54.42,G89.29 (ICD-10-CM) - Chronic midline low back pain with bilateral sciatica  M70.61,M70.62 (ICD-10-CM) - Trochanteric bursitis of both hips   THERAPY DIAG:  Low Back Pain   SUBJECTIVE:   SUBJECTIVE STATEMENT:  Patient had an insidious onset of lower back pain about 2 months ago. The pain is in the middle of the back. The pain is worse at times with  certain movements. 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 The CVA was the end of July. All symptoms have resolved from the CVA. He also has interstitial lung disease which effects his endurance.                                                                                                                                                                                                             PERTINENT HISTORY: depression; arthritis, lupus, morbid obesity, right foot parathesia; stroke;   PAIN:  Are you having pain? Yes VAS scale: 3/10 Pain location: Middle of the lower back Pain orientation: Bilateral  PAIN TYPE: aching and sharp sharp when he tunrs certain ways  Pain description: constant but has intermittent catches  Aggravating factors: turning certain ways  Relieving factors: rest; has a spray   PRECAUTIONS: None  WEIGHT BEARING RESTRICTIONS No  FALLS: Has patient fallen in last 6 months? No, Number of falls:   LIVING ENVIRONMENT: Lives with: lives with their family Lives in: House/apartment Stairs: No;  Has following equipment at home: None  PLOF: Independent  Likes to walk and fish   PATIENT GOALS  to have less pain   OBJECTIVE:   DIAGNOSTIC FINDINGS: Impression: These findings are consistent with facet joint arthropathy.    Wedging of the T12 and L1 vertebra was noted representing old compression  fractures.   PATIENT SURVEYS:    SCREENING FOR RED FLAGS: Bowel or bladder incontinence: No Spinal tumors: No Cauda equina syndrome: No Compression fracture: No Abdominal aneurysm: No  COGNITION:  Overall cognitive status: Within functional limits for tasks assessed     SENSATION:  Light touch: Appears intact  Stereognosis: Appears intact  Hot/Cold: Appears intact  Proprioception: Appears intact Denies paresthesias       LUMBARAROM/PROM  A/PROM A/PROM  08/17/2021  Flexion 40 with pain  Extension WNL movement but  painful   Right lateral flexion Painful   Left lateral flexion More pain at end range   Right rotation   Left rotation More pain to the left    (Blank rows = not tested)  LE AROM/PROM:  Pain with bilateral hip IR   LE MMT:  MMT Right 08/17/2021 Left 08/17/2021  Hip flexion 5/5 4+/5  Hip abduction 5/5 5/5  Hip adduction 5/5 5/5  Hip internal rotation    Hip external rotation    Knee flexion 5/5 5/5  Knee extension 5/5 5/5  Ankle dorsiflexion    Ankle plantarflexion    Ankle inversion    Ankle eversion     (Blank rows = not tested)  LUMBAR SPECIAL TESTS:  Slump test: Negative  SPINAL SEGMENTAL MOBILITY ASSESSMENT:  Decreased PA mobility in lower lumbar   GAIT: WNL   Today's Treatment:  Access Code: [email or web address] URL: [email or web address] Date: 08/18/2021 Prepared by: Carolyne Littles  Program Notes Thera-cane/ Liba back massager    Exercises Supine Piriformis Stretch with Foot on Ground - 1 x daily - 7 x weekly - 3 sets - 3 reps - 20 hold Seated Hamstring Stretch - 1 x daily - 7 x weekly - 3 sets - 3 reps - 20 hold Seated Hip Abduction with Resistance - 1 x daily - 7 x weekly - 3 sets - 10 reps Standing Glute Med Mobilization with Small Ball on Wall - 1 x daily - 7 x weekly - 1 min hold   PATIENT EDUCATION:  Education details: reviewed symptom management/ anatomy of condition/ HEP  Person educated: Patient Education method: Explanation, Demonstration, Tactile cues, Verbal cues, and Handouts Education comprehension: verbalized understanding and returned demonstration   HOME EXERCISE PROGRAM: Access Code: [email or web address] URL: [email or web address] Date: 08/18/2021 Prepared by: Carolyne Littles  Program Notes Thera-cane/ Liba back massager    Exercises Supine Piriformis Stretch with Foot on Ground - 1 x daily - 7 x weekly - 3 sets - 3 reps - 20 hold Seated Hamstring Stretch - 1 x daily - 7 x weekly - 3 sets - 3 reps - 20 hold Seated Hip  Abduction with Resistance - 1 x daily - 7 x weekly - 3 sets - 10 reps Standing Glute Med Mobilization with Small Ball on Wall - 1 x daily - 7 x weekly - 1 min hold   ASSESSMENT:  CLINICAL IMPRESSION: Patient is a 50 y.o. mal who was seen today for physical therapy evaluation and treatment for Low back pain. He has had pain for about 2 months. He has increased pain with all end range lumbar motion. The patient has spasming in his lower lumbar paraspinals and upper gluteals. 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 He would benefit from skilled therapy to return to active lifestyle and work without pain.    Abnormal gait, decreased activity tolerance, decreased balance, decreased endurance, decreased mobility, difficulty walking, decreased ROM, decreased strength, increased fascial restrictions, increased muscle spasms, and improper body mechanics driving, shopping, and yard work Environmental consultant and 3+ comorbidities: interstitial lung disease/ CVA/ Lupus   REHAB POTENTIAL: Good  CLINICAL DECISION MAKING: Evolving/moderate complexity  EVALUATION COMPLEXITY: Moderate   GOALS: Goals reviewed with patient? Yes  SHORT TERM GOALS:  STG Name Target Date Goal status  1 Patient will increase lumbar flexion by 20 degrees  Baseline:  09/08/2021 INITIAL  2 Patient will demonstrate 5/5 gross hip flexion strength  Baseline:  09/08/2021  INITIAL  3 Patient will be indepdnent with basic strengthening and stretching program  Baseline: 09/08/2021 INITIAL  LONG TERM GOALS:   LTG Name Target Date Goal status  1 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 without increased pain  Baseline: 09/29/2021 INITIAL  2 Patient will lift his child without increased pain  Baseline: 09/29/2021 INITIAL  3 Patient will dmeonstrate full pain free lumbar ROM without increased pain  Baseline: 09/29/2021 INITIAL  PLAN: PT FREQUENCY: 1-2x/week  PT DURATION: 6 weeks  PLANNED  INTERVENTIONS: Therapeutic exercises, Therapeutic activity, Neuro Muscular re-education, Balance training, Gait training, Patient/Family education, Joint mobilization, Aquatic Therapy, Dry Needling, Electrical stimulation, Cryotherapy, Moist heat, Taping, Traction, and Ultrasound  PLAN FOR NEXT SESSION: consider manual therapy to lumbar spine including TPDN;  review HEP; add seated core exercises. And standing. Patient did not tolerate supine positioning as well. Continue to monitor lumbar flexion extension and rotation. Modalities PRN   Carney Living PT DPT  08/17/2021, 9:59 AM

## 2021-08-18 ENCOUNTER — Ambulatory Visit (HOSPITAL_BASED_OUTPATIENT_CLINIC_OR_DEPARTMENT_OTHER): Payer: BC Managed Care – PPO | Attending: Rheumatology | Admitting: Physical Therapy

## 2021-08-18 ENCOUNTER — Other Ambulatory Visit: Payer: Self-pay

## 2021-08-18 DIAGNOSIS — M545 Low back pain, unspecified: Secondary | ICD-10-CM | POA: Diagnosis not present

## 2021-08-18 DIAGNOSIS — G8929 Other chronic pain: Secondary | ICD-10-CM | POA: Diagnosis not present

## 2021-08-18 DIAGNOSIS — M62830 Muscle spasm of back: Secondary | ICD-10-CM | POA: Diagnosis not present

## 2021-08-19 ENCOUNTER — Encounter: Payer: Self-pay | Admitting: Physician Assistant

## 2021-08-19 ENCOUNTER — Encounter (HOSPITAL_BASED_OUTPATIENT_CLINIC_OR_DEPARTMENT_OTHER): Payer: Self-pay | Admitting: Physical Therapy

## 2021-08-20 ENCOUNTER — Ambulatory Visit (INDEPENDENT_AMBULATORY_CARE_PROVIDER_SITE_OTHER): Payer: BC Managed Care – PPO

## 2021-08-20 ENCOUNTER — Other Ambulatory Visit: Payer: Self-pay

## 2021-08-20 DIAGNOSIS — Z7901 Long term (current) use of anticoagulants: Secondary | ICD-10-CM

## 2021-08-20 LAB — POCT INR: INR: 2.7 (ref 2.0–3.0)

## 2021-08-20 NOTE — Patient Instructions (Addendum)
Pre visit review using our clinic review tool, if applicable. No additional management support is needed unless otherwise documented below in the visit note.  Continue to take 2 tablets every day except 3 tablets on Monday and Thursdays.  Re-check in 4 weeks.

## 2021-08-21 ENCOUNTER — Ambulatory Visit (INDEPENDENT_AMBULATORY_CARE_PROVIDER_SITE_OTHER)
Admission: RE | Admit: 2021-08-21 | Discharge: 2021-08-21 | Disposition: A | Discharge status: Home / Self Care | Payer: BC Managed Care – PPO | Source: Ambulatory Visit | Attending: Pulmonary Disease | Admitting: Pulmonary Disease

## 2021-08-21 DIAGNOSIS — R911 Solitary pulmonary nodule: Secondary | ICD-10-CM | POA: Diagnosis not present

## 2021-08-21 DIAGNOSIS — J849 Interstitial pulmonary disease, unspecified: Secondary | ICD-10-CM

## 2021-08-21 DIAGNOSIS — I700 Atherosclerosis of aorta: Secondary | ICD-10-CM | POA: Diagnosis not present

## 2021-08-26 ENCOUNTER — Ambulatory Visit (HOSPITAL_BASED_OUTPATIENT_CLINIC_OR_DEPARTMENT_OTHER): Payer: BC Managed Care – PPO | Admitting: Physical Therapy

## 2021-08-27 DIAGNOSIS — G4733 Obstructive sleep apnea (adult) (pediatric): Secondary | ICD-10-CM | POA: Diagnosis not present

## 2021-09-01 ENCOUNTER — Other Ambulatory Visit: Payer: Self-pay

## 2021-09-01 ENCOUNTER — Encounter (HOSPITAL_BASED_OUTPATIENT_CLINIC_OR_DEPARTMENT_OTHER): Payer: Self-pay | Admitting: Physical Therapy

## 2021-09-01 ENCOUNTER — Ambulatory Visit (HOSPITAL_BASED_OUTPATIENT_CLINIC_OR_DEPARTMENT_OTHER): Payer: BC Managed Care – PPO | Attending: Rheumatology | Admitting: Physical Therapy

## 2021-09-01 DIAGNOSIS — M545 Low back pain, unspecified: Secondary | ICD-10-CM | POA: Diagnosis not present

## 2021-09-01 DIAGNOSIS — R41841 Cognitive communication deficit: Secondary | ICD-10-CM | POA: Diagnosis not present

## 2021-09-01 DIAGNOSIS — G8929 Other chronic pain: Secondary | ICD-10-CM | POA: Insufficient documentation

## 2021-09-01 DIAGNOSIS — R4701 Aphasia: Secondary | ICD-10-CM | POA: Diagnosis not present

## 2021-09-01 DIAGNOSIS — M62830 Muscle spasm of back: Secondary | ICD-10-CM | POA: Diagnosis not present
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
## 2021-09-08 ENCOUNTER — Other Ambulatory Visit: Payer: Self-pay

## 2021-09-08 ENCOUNTER — Ambulatory Visit (HOSPITAL_BASED_OUTPATIENT_CLINIC_OR_DEPARTMENT_OTHER): Payer: BC Managed Care – PPO | Admitting: Physical Therapy

## 2021-09-08 ENCOUNTER — Encounter (HOSPITAL_BASED_OUTPATIENT_CLINIC_OR_DEPARTMENT_OTHER): Payer: Self-pay | Admitting: Physical Therapy

## 2021-09-08 DIAGNOSIS — G8929 Other chronic pain: Secondary | ICD-10-CM | POA: Diagnosis not present

## 2021-09-08 DIAGNOSIS — M62830 Muscle spasm of back: Secondary | ICD-10-CM

## 2021-09-08 DIAGNOSIS — R4701 Aphasia: Secondary | ICD-10-CM | POA: Diagnosis not present

## 2021-09-08 DIAGNOSIS — M545 Low back pain, unspecified: Secondary | ICD-10-CM | POA: Diagnosis not present

## 2021-09-08 DIAGNOSIS — R41841 Cognitive communication deficit: Secondary | ICD-10-CM | POA: Diagnosis not present

## 2021-09-08 NOTE — Therapy (Addendum)
OUTPATIENT PHYSICAL THERAPY TREATMENT NOTE   Patient Name: Wesley Hancock MRN: [id number] DOB:04-16-1971, 50 y.o., male Today's Date: 09/08/2021  PCP: Inda Coke, [place] REFERRING PROVIDER: Inda Coke, PA   PT End of Session - 09/08/21 0902     Visit Number 3    Number of Visits 12    Date for PT Re-Evaluation 09/30/21    Authorization Type BCBS    PT Start Time 0800    PT Stop Time 0843    PT Time Calculation (min) 43 min    Activity Tolerance Patient tolerated treatment well    Behavior During Therapy [facility] for tasks assessed/performed             Past Medical History:  Diagnosis Date   Acute nonintractable headache 09/06/2019   Acute respiratory failure with hypoxia ([place]) 08/30/2019   AKI (acute kidney injury) ([place]) 08/30/2019   Antiphospholipid syndrome ([place]) 10/30/2019   Anxiety    APS (antiphospholipid syndrome) ([place])    Arthritis    Colon polyp    ? hyperplastic   Depression    Diarrhea 05/21/2020   DVT (deep venous thrombosis) ([place])    Dyspnea on exertion 09/08/2017   "Quit smoking" 2015 with onset of symptoms in 2016  Spirometry 09/08/2017  Flat f/v loop  - d/c acei 09/08/2017  - 01/04/2020   Walked RA x two laps =  approx 578ft @ fast pace - stopped due to end of study/ min sob with sats of 93 % at the end of the study. - PFT's  03/08/20   FEV1 3.84 (83 % ) ratio 0..90  p 3 % improvement from saba p ? 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 06/26/20, 03/08/20   Snoring 07/09/2020   Stroke ([place])    Upper airway cough syndrome 01/05/2020   Onset mid Jan 2021 while on otc PPI  - max rx for gerd 01/04/2020 >>>      Witnessed apneic spells 07/09/2020   Past Surgical History:  Procedure Laterality Date   BRONCHIAL WASHINGS  01/14/2021   Procedure: BRONCHIAL WASHINGS;  Surgeon: Freddi Starr, MD;  Location: [facility];  Service: Pulmonary;;   CHOLECYSTECTOMY     VIDEO BRONCHOSCOPY N/A 01/14/2021   Procedure: VIDEO BRONCHOSCOPY WITHOUT FLUORO;  Surgeon: Freddi Starr, MD;  Location[facility];  Service: Pulmonary;  Laterality: N/A;   Patient Active Problem List   Diagnosis Date Noted   ILD (interstitial lung disease) ([place]) 05/27/2021   Long term (current) use of anticoagulants 03/26/2021   CVA (cerebral vascular accident) ([place]) 03/20/2021   Mixed connective tissue  disease ([place]) 01/21/2021   Hyperglycemia 01/16/2021   Multinodular goiter 01/10/2021   OSA on CPAP 10/11/2020   Esophageal spasm    Colon polyp    Arthritis    APS (antiphospholipid syndrome) ([name])    Anxiety    CKD (chronic kidney disease) stage 2, GFR 60-89 ml/min 07/11/2020   Paresthesia of right foot 11/08/2019   Morbid obesity due to excess calories ([place]) c/b hbp/ dvt/PE 09/10/2019   DVT (deep venous thrombosis) ([place]) 08/30/2019   Multiple pulmonary emboli ([place]) 08/22/2019   Depression 06/24/2017   Dyslipidemia 09/18/2015   Hypertensive heart disease 09/23/2014   Nutcracker esophagus 09/21/2014   Lung nodule 09/21/2014   ONSET DATE: about 2 months ago    REFERRING DIAG:  Diagnosis  M54.41,M54.42,G89.29  (ICD-10-CM) - Chronic midline low back pain with bilateral sciatica  M70.61,M70.62 (ICD-10-CM) - Trochanteric bursitis of both hips    THERAPY DIAG:  Low Back Pain    SUBJECTIVE:    SUBJECTIVE STATEMENT:  Patient has been having significant pain in his mid thoracic area over the past week. He has started friving again. He feels like using the shifter may be playing a role in his pain.  His pain is on the right side and loops around into his ribs.                                                                                          PERTINENT HISTORY:   interstitial lung disease, depression; arthritis, lupus, morbid obesity, right foot parathesia; stroke;  x ray findings of facet joint arthropathy and Wedging of the T12 and L1 vertebra was noted representing old compression fractures.    PAIN:  Are you having pain? Yes VAS scale: 7/10 Pain location: right mid thoracic area  Aggravating factors: turning certain ways  Relieving factors:  HEP, theracane      PATIENT GOALS  to have less pain     OBJECTIVE:       -Therapeutic exercise: -Reviewed response to Rx, pain level, HEP compliance, and current function. -Reviewed HEP and performed HEP.   -Seated HS stretch 3x20-30 sec bilat -seated Hip abd with TrA with GTB 2x10 reps -supine piriformis stretch 3x20-30 sec bilat            -S/L clams x10 reps bilat -Reviewed theracane   -Manual Therapy:               -STM to bilat lumbar paraspinals in prone to improve soft tissue tightness, myofascial restrictions, mobility, an pain  10/10 treatment.   TPDN: to mid throacic multifidi on the right 3 spots using a .30x50 needle consent and education given  -Manual Therapy:               -STM to bilat lumbar paraspinals/thoracic multifidi  in prone to improve soft tissue tightness, myofascial restrictions, mobility, and pain   Stretches: Posterior capsule 2x20 sec hold; rhomboid stretch 2x20 sec hold; sink stretch with education for  posture and foot position  Exercises:  Extension: with abdominal breathing red 3x10  Scap retraction 2x10 red band  Bilateral er 2x10 red band  PATIENT EDUCATION:  Education details: benefits and risks of TPDN; stretching of mid thoracic area vs lower back  Person educated: Patient Education method: Explanation, Demonstration, Tactile cues, and Verbal cues Education comprehension: verbalized understanding and returned demonstration     HOME EXERCISE PROGRAM: Access Code: [email or web address] URL: [email or web address] Date: 08/18/2021 Prepared by: Carolyne Littles   Program Notes Thera-cane/ Liba back massager      Exercises Supine Piriformis Stretch with Foot on Ground - 1 x daily - 7 x weekly - 3 sets - 3 reps - 20 hold Seated Hamstring Stretch - 1 x daily - 7 x weekly - 3 sets - 3 reps - 20 hold Seated Hip Abduction with Resistance - 1 x daily - 7 x weekly - 3 sets - 10 reps Standing Glute Med Mobilization with Small Ball on Wall - 1 x daily - 7 x weekly - 1 min hold     ASSESSMENT:   CLINICAL IMPRESSION: Patient had a good twitch response with all needles. He reported feeling looser after the needling and soft tissue mobilization. Therapy reviewed exercises more geared towards his mid thoriacic area. He could feel those                   Abnormal gait, decreased activity tolerance, decreased balance, decreased endurance, decreased mobility, difficulty walking, decreased ROM, decreased strength, increased fascial restrictions, increased muscle spasms, and improper body mechanics driving, shopping, and yard work Environmental consultant and 3+ comorbidities: interstitial lung disease/ CVA/ Lupus      GOALS:     SHORT TERM GOALS:   STG Name Target Date Goal status  1 Patient will increase lumbar flexion by 20 degrees  Baseline:  09/08/2021 INITIAL  2 Patient will demonstrate 5/5 gross hip flexion strength  Baseline:  09/08/2021 INITIAL  3 Patient will be indepdnent with  basic strengthening and stretching program  Baseline: 09/08/2021 INITIAL  LONG TERM GOALS:    LTG Name Target Date Goal status  1 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 without increased pain  Baseline: 09/29/2021 INITIAL  2 Patient will lift his child without increased pain  Baseline: 09/29/2021 INITIAL  3 Patient will dmeonstrate full pain free lumbar ROM without increased pain  Baseline: 09/29/2021 INITIAL  PLAN:   PLANNED INTERVENTIONS: Therapeutic exercises, Therapeutic activity, Neuro Muscular re-education, Balance training, Gait training, Patient/Family education, Joint mobilization, Aquatic Therapy, Dry Needling, Electrical stimulation, Cryotherapy, Moist heat, Taping, Traction, and Ultrasound   PLAN FOR NEXT SESSION: consider manual therapy to lumbar spine including TPDN;  Cont with flexibility and core strengthening.  add seated core exercises and standing. Patient did not tolerate supine positioning as well on the initial evaluation. Continue to monitor lumbar flexion extension and rotation. Modalities PRN               Carney Living PT DPT  09/08/2021, 9:08 AM

## 2021-09-13 ENCOUNTER — Other Ambulatory Visit: Payer: Self-pay | Admitting: Physician Assistant

## 2021-09-15 ENCOUNTER — Other Ambulatory Visit: Payer: Self-pay

## 2021-09-15 ENCOUNTER — Ambulatory Visit (HOSPITAL_BASED_OUTPATIENT_CLINIC_OR_DEPARTMENT_OTHER): Payer: BC Managed Care – PPO | Admitting: Physical Therapy

## 2021-09-15 ENCOUNTER — Encounter (HOSPITAL_BASED_OUTPATIENT_CLINIC_OR_DEPARTMENT_OTHER): Payer: Self-pay | Admitting: Physical Therapy

## 2021-09-15 DIAGNOSIS — R4701 Aphasia: Secondary | ICD-10-CM

## 2021-09-15 DIAGNOSIS — G8929 Other chronic pain: Secondary | ICD-10-CM | POA: Diagnosis not present

## 2021-09-15 DIAGNOSIS — M545 Low back pain, unspecified: Secondary | ICD-10-CM

## 2021-09-15 DIAGNOSIS — M62830 Muscle spasm of back: Secondary | ICD-10-CM | POA: Diagnosis not present

## 2021-09-15 DIAGNOSIS — R41841 Cognitive communication deficit: Secondary | ICD-10-CM | POA: Diagnosis not present

## 2021-09-15 NOTE — Therapy (Addendum)
OUTPATIENT PHYSICAL THERAPY TREATMENT NOTE/discharge    Patient Name: Wesley Hancock MRN: [id number] DOB:[birth year], 50 y.o., male Today's Date: 09/15/2021  PCP: Inda Coke, [place] REFERRING PROVIDER: Inda Coke, [place]   PT End of Session - 09/15/21 1134     Visit Number 4    Number of Visits 12    Date for PT Re-Evaluation 09/30/21    Authorization Type BCBS    PT Start Time 0803    PT Stop Time 0845    PT Time Calculation (min) 42 min    Activity Tolerance Patient tolerated treatment well    Behavior During Therapy [facility] for tasks assessed/performed             Past Medical History:  Diagnosis Date   Acute nonintractable headache 09/06/2019   Acute respiratory failure with hypoxia ([place]) 08/30/2019   AKI (acute kidney injury) ([place]) 08/30/2019   Antiphospholipid syndrome ([place]) 10/30/2019   Anxiety    APS (antiphospholipid syndrome) ([place])    Arthritis    Colon polyp    ? hyperplastic   Depression    Diarrhea 05/21/2020   DVT (deep venous thrombosis) ([place])    Dyspnea on exertion 09/08/2017   "Quit smoking" 2015 with onset of symptoms in 2016  Spirometry 09/08/2017  Flat f/v loop  - d/c acei 09/08/2017  - 01/04/2020   Walked RA x two laps =  approx 523f @ fast pace - stopped due to end of study/ min sob with sats of 93 % at the end of the study. - PFT's  03/08/20   FEV1 3.84 (83 % ) ratio 0..90  p 3 % improvement from saba p ? 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 06/26/20, 03/08/20   Snoring 07/09/2020   Stroke ([place]    Upper airway cough syndrome 01/05/2020   Onset mid Jan 2021 while on otc PPI  - max rx for gerd 01/04/2020 >>>      Witnessed apneic spells 07/09/2020   Past Surgical History:  Procedure Laterality Date   BRONCHIAL WASHINGS  01/14/2021   Procedure: BRONCHIAL WASHINGS;  Surgeon: DFreddi Starr MD;  [facility];  Service: Pulmonary;;   CHOLECYSTECTOMY     VIDEO BRONCHOSCOPY N/A 01/14/2021   Procedure: VIDEO BRONCHOSCOPY WITHOUT FLUORO;  Surgeon: DFreddi Starr MD;  [facility];  Service: Pulmonary;  Laterality: N/A;   Patient Active Problem List   Diagnosis Date Noted   ILD (interstitial lung disease) ([place] 05/27/2021   Long term (current) use of anticoagulants 03/26/2021   CVA (cerebral vascular accident) ([place] 03/20/2021   Mixed connective  tissue disease ([place]) 01/21/2021   Hyperglycemia 01/16/2021   Multinodular goiter 01/10/2021   OSA on CPAP 10/11/2020   Esophageal spasm    Colon polyp    Arthritis    APS (antiphospholipid syndrome) ([name])    Anxiety    CKD (chronic kidney disease) stage 2, GFR 60-89 ml/min 07/11/2020   Paresthesia of right foot 11/08/2019   Morbid obesity due to excess calories ([place]) c/b hbp/ dvt/PE 09/10/2019   DVT (deep venous thrombosis) ([place]) 08/30/2019   Multiple pulmonary emboli ([place]) 08/22/2019   Depression 06/24/2017   Dyslipidemia 09/18/2015   Hypertensive heart disease 09/23/2014   Nutcracker esophagus 09/21/2014   Lung nodule 09/21/2014   ONSET DATE: about 2 months ago    REFERRING DIAG:  Diagnosis   M54.41,M54.42,G89.29 (ICD-10-CM) - Chronic midline low back pain with bilateral sciatica  M70.61,M70.62 (ICD-10-CM) - Trochanteric bursitis of both hips    THERAPY DIAG:  Low Back Pain    SUBJECTIVE:    SUBJECTIVE STATEMENT:  Patient reports he got sore after the needling. He did well for a few days, but by the end of the week he was back to being sore.    PERTINENT HISTORY:   interstitial lung disease, depression; arthritis, lupus, morbid obesity, right foot parathesia; stroke;  x ray findings of facet joint arthropathy and Wedging of the T12 and L1 vertebra was noted representing old compression fractures.    PAIN:  Are you having pain? Yes VAS scale: 4/10 Pain location: right mid thoracic area  Aggravating factors: turning certain ways  Relieving factors:  HEP, theracane      PATIENT GOALS  to have less pain     OBJECTIVE:     Treatment:    10/10 bility, and pain    Stretches: Posterior capsule 2x20 sec hold; rhomboid stretch 2x20 sec hold; sink stretch with education for posture and foot position   Exercises:  Extension: with abdominal breathing red 3x10  Scap retraction 2x10 red band  Bilateral er 2x10 red band   10/17  Nustep x5 min      PATIENT EDUCATION:  Education details: benefits and risks of TPDN; stretching of mid thoracic area vs lower back  Person educated: Patient Education method: Explanation, Demonstration, Tactile cues, and Verbal cues Education comprehension: verbalized understanding and returned demonstration     HOME EXERCISE PROGRAM: Access Code: [email or web address] URL: [email or web address] Date: 08/18/2021 Prepared by: Carolyne Littles   Program Notes Thera-cane/ Liba back massager      Exercises Supine Piriformis Stretch with Foot on Ground - 1 x daily - 7 x weekly - 3 sets - 3 reps - 20 hold Seated Hamstring Stretch - 1 x daily - 7 x weekly - 3 sets - 3 reps - 20 hold Seated Hip Abduction with Resistance - 1 x daily - 7 x  weekly - 3 sets - 10 reps Standing Glute Med Mobilization with Small Ball on Wall - 1 x daily - 7 x weekly - 1 min hold   10/3        -Therapeutic exercise: -Reviewed response to Rx, pain level, HEP compliance, and current function. -Reviewed HEP and performed HEP.   -Seated HS stretch 3x20-30 sec bilat -seated Hip abd with TrA with GTB 2x10 reps -supine piriformis stretch 3x20-30 sec bilat            -S/L clams x10 reps bilat -Reviewed theracane - Standing Row with band 2x10 green  - Shoulder extension 2x10 green    -Manual Therapy:               -  STM to bilat lumbar paraspinals in prone to improve soft tissue tightness, myofascial restrictions, mobility, an pain   10/10     TPDN: to mid throacic multifidi on the right 3 spots using a .30x50 needle consent and education given  -Manual Therapy:                -STM to bilat lumbar paraspinals/thoracic multifidi  in prone to improve soft tissue tightness, myofascial restrictions, mo   10/17  PDN: to mid throacic multifidi on the right 3 spots using a .30x50 needle consent and education given  -Manual Therapy:                -STM to bilat lumbar paraspinals/thoracic multifidi  in prone to improve soft tissue tightness, myofascial restrictions, used IASTYM   - Nu-step 4 min - bilateral ER red 2x10 - Horizontal abduction 2x10 red  - Bilateral flexion 2x10 red   - upper back stretch 3x20 sec hold  - posterior capsule 2x 20 sec each side      ASSESSMENT:   CLINICAL IMPRESSION:    Patient had spasming along his right paraspinals , although it felt a little better then last visit. The patient tolerated needling well. Therapy needled along the mid thoracic paraspinals. He was given a series of Upper back postural exercises for home. He had some pain with flexion. Overall his lower back is improving.                  Abnormal gait, decreased activity tolerance, decreased balance, decreased endurance, decreased mobility,  difficulty walking, decreased ROM, decreased strength, increased fascial restrictions, increased muscle spasms, and improper body mechanics driving, shopping, and yard work Environmental consultant and 3+ comorbidities: interstitial lung disease/ CVA/ Lupus      GOALS:     SHORT TERM GOALS:   STG Name Target Date Goal status  1 Patient will increase lumbar flexion by 20 degrees  Baseline:  09/08/2021 INITIAL  2 Patient will demonstrate 5/5 gross hip flexion strength  Baseline:  09/08/2021 INITIAL  3 Patient will be indepdnent with basic strengthening and stretching program  Baseline: 09/08/2021 INITIAL  LONG TERM GOALS:    LTG Name Target Date Goal status  1 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 without increased pain  Baseline: 09/29/2021 INITIAL  2 Patient will lift his child without increased pain  Baseline: 09/29/2021 INITIAL  3 Patient will dmeonstrate full pain free lumbar ROM without increased pain  Baseline: 09/29/2021 INITIAL  PLAN:   PLANNED INTERVENTIONS: Therapeutic exercises, Therapeutic activity, Neuro Muscular re-education, Balance training, Gait training, Patient/Family education, Joint mobilization, Aquatic Therapy, Dry Needling, Electrical stimulation, Cryotherapy, Moist heat, Taping, Traction, and Ultrasound   PLAN FOR NEXT SESSION: consider manual therapy to lumbar spine including TPDN;  Cont with flexibility and core strengthening.  add seated core exercises and standing. Patient did not tolerate supine positioning as well on the initial evaluation. Continue to monitor lumbar flexion extension and rotation. Modalities PRN       PHYSICAL THERAPY DISCHARGE SUMMARY  Visits from Start of Care: 4  Current functional level related to goals / functional outcomes: Unknown patient did not return    Remaining deficits: Unknown    Education / Equipment:has a base HEP   Patient agrees to discharge. Patient goals were not met. Patient is being discharged due to being  pleased with the current functional level.           Carney Living PT DPT  09/15/2021, 11:36 AM Addendum 10/23

## 2021-09-16 ENCOUNTER — Ambulatory Visit: Payer: BC Managed Care – PPO | Admitting: Gastroenterology

## 2021-09-16 NOTE — Progress Notes (Deleted)
[facility] Office Note:  History: Wesley Hancock 09/16/2021  Referring provider: Inda Coke, PA  Reason for consult/chief complaint: No chief complaint on file.   Subjective  HPI: Wesley Hancock was last seen in August 2021 with a complex medical history, details outlined in that note. He returns now for evaluation of longstanding heartburn and noncardiac chest pain as well as chronic diarrhea. He was hospitalized in April of this year with an acute left frontal CVA and is now on warfarin.  That extensive discharge summary was reviewed and is on file in the chart. Baldo also has a history of lupus with antiphospholipid antibody syndrome and interstitial lung disease, he is followed by both rheumatology and pulmonary as well as neurology and primary care. ***   ROS:  Review of Systems   Past Medical History: Past Medical History:  Diagnosis Date   Acute nonintractable headache 09/06/2019   Acute respiratory failure with hypoxia ([place]) 08/30/2019   AKI (acute kidney injury) ([place]) 08/30/2019   Antiphospholipid syndrome ([place]) 10/30/2019   Anxiety    APS (antiphospholipid syndrome) ([place])    Arthritis    Colon polyp    ? hyperplastic   Depression    Diarrhea 05/21/2020   DVT (deep venous thrombosis) ([name])    Dyspnea on exertion 09/08/2017   "Quit smoking" 2015 with onset of symptoms in 2016  Spirometry 09/08/2017  Flat f/v loop  - d/c acei 09/08/2017  - 01/04/2020   Walked RA x two laps =  approx 553ft @ fast pace - stopped due to end of study/ min sob with sats of 93 % at the end of the study. - PFT's  03/08/20   FEV1 3.84 (83 % ) ratio 0..90  p 3 % improvement from saba p ? 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 06/26/20, 03/08/20   Snoring 07/09/2020   Stroke ([place])    Upper airway cough syndrome 01/05/2020   Onset mid Jan 2021 while on otc PPI  - max rx for gerd 01/04/2020 >>>      Witnessed apneic spells 07/09/2020   From 05/27/2021 pulmonary clinic note: "Wesley Hancock is a 50 year old male, former smoker with history of DVT/PE due to antiphospholipid syndrome diagnosed in September 2020 who presented with shortness of breath and chest pain 01/13/21 with workup showing signs of interstitial lung disease related to lupus/mixed connective tissue overlap syndrome.    He was treated with IV solumedrol in the hospital for 2 days and then transitioned to 60mg  of prednisone daily along with bactrim prophylaxis which was tapered to 40mg  daily  on 01/30/21 with initiation of 500mg  of mycophenolate. He was then tapered to 30mg  daily prednisone on 03/25/21 and an increase in the mycophenolate to 1g BID. His prednisone is currently at 10mg  daily and he will be tapering down to 5mg  daily this weekend for a few days then will be stopping prednisone in early July. Overall his respiratory symptoms have improved significantly and he is able to be more active. I have encouraged him to work on further weight loss and he is considering joining the [employer].     We will check labs today for cellcept monitoring.    We  will repeat a HRCT chest in early September with follow up soon after. " ________  Rheumatology office note from 07/24/2021 also reviewed     Past Surgical History: Past Surgical History:  Procedure Laterality Date   BRONCHIAL WASHINGS  01/14/2021   Procedure: BRONCHIAL WASHINGS;  Surgeon: Freddi Starr, MD;  [facility];  Service: Pulmonary;;   CHOLECYSTECTOMY     VIDEO BRONCHOSCOPY N/A 01/14/2021   Procedure: VIDEO BRONCHOSCOPY WITHOUT FLUORO;  Surgeon: Freddi Starr, MD;  [facility];  Service: Pulmonary;  Laterality: N/A;     Family History: Family History  Problem Relation Age of Onset   Diabetes Mother    Hyperlipidemia Mother    Hypertension Mother    Thyroid disease Mother        uncertain type--had surg--no cancer   Heart disease Father 52   Hyperlipidemia Father    Hypertension Father    Prostate cancer Father 65   Lung cancer Father 62       Dx 06/18/2017   Colon polyps Father    Irritable bowel syndrome Father    Diverticulitis Father    Diabetes Sister    Hyperlipidemia Sister    Hypertension Sister    Diabetes Maternal Grandmother    Heart disease Maternal Grandmother    Hyperlipidemia Maternal Grandmother    Hypertension Maternal Grandmother    Colon cancer Maternal Grandmother    Heart disease Maternal Grandfather    Hyperlipidemia Maternal Grandfather    Hypertension Maternal Grandfather    Stroke Maternal Grandfather    Liver cancer Maternal Grandfather    Irritable bowel syndrome Maternal Grandfather    Heart disease Paternal Grandmother    Healthy Son    Healthy Daughter    Healthy Daughter     Social History: Social History   Socioeconomic History   Marital status: Married    Spouse name: Lauren   Number of children: 3   Years of education: Not on file   Highest education level: Not on file  Occupational History   Occupation: Truck driver  Tobacco Use   Smoking status: Former    Packs/day: 1.00     Years: 20.00    Pack years: 20.00    Types: Cigarettes    Quit date: 11/30/2013    Years since quitting: 7.8   Smokeless tobacco: Former    Types: Snuff    Quit date: 05/21/2018  Vaping Use   Vaping Use: Never used  Substance and Sexual Activity   Alcohol use: Not Currently   Drug use: No   Sexual activity: Yes  Other Topics Concern   Not on file  Social History Narrative   Lives with wife and daughter at home   Right Handed   Drinks 4 cups caffeine daily   Social Determinants of Health   Financial Resource Strain: Not on file  Food  Insecurity: Not on file  Transportation Needs: Not on file  Physical Activity: Not on file  Stress: Not on file  Social Connections: Not on file    Allergies: Allergies  Allergen Reactions   Diltiazem Hcl Diarrhea and Other (See Comments)    Lethargic     Outpatient Meds: Current Outpatient Medications  Medication Sig Dispense Refill   acetaminophen (TYLENOL) 500 MG tablet Take 1,000 mg by mouth every 6 (six) hours as needed for mild pain.     albuterol (VENTOLIN HFA) 108 (90 Base) MCG/ACT inhaler Inhale 2 puffs into the lungs every 6 (six) hours as needed for wheezing or shortness of breath. 8 g 6   buPROPion (WELLBUTRIN XL) 150 MG 24 hr tablet Take 1 tablet (150 mg total) by mouth daily. Take 1 tablet daily 90 tablet 1   escitalopram (LEXAPRO) 20 MG tablet Take 1 tablet (20 mg total) by mouth daily. 90 tablet 1   fluticasone (FLONASE) 50 MCG/ACT nasal spray Place 1 spray into both nostrils daily as needed for allergies.     hydroxychloroquine (PLAQUENIL) 200 MG tablet TAKE 1 TABLET BY MOUTH TWICE A DAY 180 tablet 0   mycophenolate (CELLCEPT) 500 MG tablet Take 2 tablets (1,000 mg total) by mouth 2 (two) times daily. 360 tablet 3   pantoprazole (PROTONIX) 40 MG tablet Take 1 tablet (40 mg total) by mouth daily. 90 tablet 3   Probiotic Product (PROBIOTIC PO) Take 1 capsule by mouth daily.     telmisartan-hydrochlorothiazide (MICARDIS HCT)  80-25 MG tablet TAKE 1 TABLET BY MOUTH EVERY DAY 90 tablet 1   warfarin (COUMADIN) 5 MG tablet Take 2 tablets daily except take 3 tablets on Mon and Thurs or Take as directed by anticoagulation clinic 195 tablet 1   No current facility-administered medications for this visit.      ___________________________________________________________________ Objective   Exam:  There were no vitals taken for this visit. Wt Readings from Last 3 Encounters:  07/24/21 294 lb (133.4 kg)  07/15/21 295 lb 8 oz (134 kg)  06/12/21 294 lb 8 oz (133.6 kg)    General: ***  Eyes: sclera anicteric, no redness ENT: oral mucosa moist without lesions, no cervical or supraclavicular lymphadenopathy CV: RRR without murmur, S1/S2, no JVD, no peripheral edema Resp: clear to auscultation bilaterally, normal RR and effort noted GI: soft, *** tenderness, with active bowel sounds. No guarding or palpable organomegaly noted. Skin; warm and dry, no rash or jaundice noted Neuro: awake, alert and oriented x 3. Normal gross motor function and fluent speech  Labs:  ***  Radiologic Studies:  ***  Assessment: No diagnosis found.  ***  Plan:  ***  Thank you for the courtesy of this consult.  Please call me with any questions or concerns.  Nelida Meuse III  CC: Referring provider noted above

## 2021-09-17 ENCOUNTER — Ambulatory Visit: Payer: BC Managed Care – PPO

## 2021-09-17 ENCOUNTER — Telehealth: Payer: Self-pay

## 2021-09-17 NOTE — Telephone Encounter (Signed)
Pt missed coumadin clinic apt this morning. Unable to contact pt so contacted pts wife and she will give him the msg to RS.

## 2021-09-18 ENCOUNTER — Other Ambulatory Visit: Payer: Self-pay

## 2021-09-18 ENCOUNTER — Ambulatory Visit: Payer: BC Managed Care – PPO

## 2021-09-18 ENCOUNTER — Ambulatory Visit (INDEPENDENT_AMBULATORY_CARE_PROVIDER_SITE_OTHER): Payer: BC Managed Care – PPO | Admitting: Neurology

## 2021-09-18 ENCOUNTER — Encounter: Payer: Self-pay | Admitting: Neurology

## 2021-09-18 ENCOUNTER — Ambulatory Visit (INDEPENDENT_AMBULATORY_CARE_PROVIDER_SITE_OTHER): Payer: BC Managed Care – PPO

## 2021-09-18 VITALS — BP 141/83 | HR 86 | Ht 74.0 in | Wt 300.0 lb

## 2021-09-18 DIAGNOSIS — Z7901 Long term (current) use of anticoagulants: Secondary | ICD-10-CM | POA: Diagnosis not present

## 2021-09-18 DIAGNOSIS — E785 Hyperlipidemia, unspecified: Secondary | ICD-10-CM | POA: Diagnosis not present

## 2021-09-18 DIAGNOSIS — B91 Sequelae of poliomyelitis: Secondary | ICD-10-CM

## 2021-09-18 DIAGNOSIS — I63412 Cerebral infarction due to embolism of left middle cerebral artery: Secondary | ICD-10-CM

## 2021-09-18 LAB — POCT INR: INR: 2.5 (ref 2.0–3.0)

## 2021-09-18 MED ORDER — PRALUENT 150 MG/ML ~~LOC~~ SOAJ: 150.0000 mg | SUBCUTANEOUS | 3 refills | Status: DC

## 2021-09-18 NOTE — Patient Instructions (Signed)
I had a long d/w patient about his recent stroke, antiphospholipid antibody syndrome risk for recurrent stroke/TIAs, personally independently reviewed imaging studies and stroke evaluation results and answered questions.Continue warfarin daily  for secondary stroke prevention with target INR between 2 and 3 and maintain strict control of hypertension with blood pressure goal below 130/90, diabetes with hemoglobin A1c goal below 6.5% and lipids with LDL cholesterol goal below 70 mg/dL. I also advised the patient to eat a healthy diet with plenty of whole grains, cereals, fruits and vegetables, exercise regularly and maintain ideal body weight .recommend he start injection Praluent 150 mg every 2 weeks in addition to his Crestor since his cholesterol is still not optimally controlled.  Followup in the future with me in 6 months or call earlier if necessary. Alirocumab injection What is this medication? ALIROCUMAB (al i ROC ue mab) is known as a PCSK9 inhibitor. It is used to lower the level of cholesterol in the blood. It may be used alone or in combination with other cholesterol-lowering drugs. This drug may also be used to reduce the risk of heart attack, stroke, and certain types of chest pain (unstable angina) that may need hospitalization. This medicine may be used for other purposes; ask your health care provider or pharmacist if you have questions. COMMON BRAND NAME(S): Praluent What should I tell my care team before I take this medication? They need to know if you have any of these conditions: any unusual or allergic reaction to alirocumab, other medicines, foods, dyes, or preservatives pregnant or trying to get pregnant breast-feeding How should I use this medication? This medicine is for injection under the skin. You will be taught how to prepare and give this medicine. Use exactly as directed. Take your medicine at regular intervals. Do not take your medicine more often than directed. It is  important that you put your used needles and syringes in a special sharps container. Do not put them in a trash can. If you do not have a sharps container, call your pharmacist or healthcare provider to get one. Talk to your pediatrician regarding the use of this medicine in children. Special care may be needed. Overdosage: If you think you have taken too much of this medicine contact a poison control center or emergency room at once. NOTE: This medicine is only for you. Do not share this medicine with others. What if I miss a dose? If you are on an every 2 week schedule and miss a dose, take it as soon as you can. If your next dose is to be taken in less than 7 days, then do not take the missed dose. Take the next dose at your regular time. Do not take double or extra doses. If you are on a monthly schedule and miss a dose, take it as soon as you can. If the dose given is within 7 days of the missed dose, continue with your regular monthly schedule. If the missed dose is administered after 7 days of the original date, administer the dose and start a new monthly schedule based on this date. What may interact with this medication? Interactions are not expected. This list may not describe all possible interactions. Give your health care provider a list of all the medicines, herbs, non-prescription drugs, or dietary supplements you use. Also tell them if you smoke, drink alcohol, or use illegal drugs. Some items may interact with your medicine. What should I watch for while using this medication? You may need blood  work done while you are taking this medicine. What side effects may I notice from receiving this medication? Side effects that you should report to your doctor or health care professional as soon as possible: allergic reactions like skin rash, itching or hives, swelling of the face, lips, or tongue signs and symptoms of infection like fever or chills; cough; sore throat; pain or trouble passing  urine signs and symptoms of liver injury like dark yellow or brown urine; general ill feeling or flu-like symptoms; light-colored stools; loss of appetite; nausea; right upper belly pain; unusually weak or tired; yellowing of the eyes or skin Side effects that usually do not require medical attention (report to your doctor or health care professional if they continue or are bothersome): diarrhea muscle cramps muscle pain pain, redness, or irritation at site where injected This list may not describe all possible side effects. Call your doctor for medical advice about side effects. You may report side effects to FDA at 1-800-FDA-1088. Where should I keep my medication? Keep out of the reach of children. You will be instructed on how to store this medicine. Throw away any unused medicine after the expiration date on the label. NOTE: This sheet is a summary. It may not cover all possible information. If you have questions about this medicine, talk to your doctor, pharmacist, or health care provider.  2022 Elsevier/Gold Standard (2018-03-31 22:02:52)

## 2021-09-18 NOTE — Progress Notes (Signed)
[facility] [address] [place]. [place] [address] [phone]       OFFICE FOLLOW UP VISIT NOTE  [name] Date of Birth:  May 08, 1971 Medical Record Number:  [id number]   Referring MD: Wesley Hancock  Reason for Referral: Stroke  HPI: Initial visit 06/12/2021 Wesley Hancock is a 50 year old Caucasian male seen today for initial office consultation visit for stroke.  History is obtained from the patient and his wife and review of electronic medical records and I personally reviewed available imaging films in PACS.  He has past medical history of obesity, hypertension, hyperlipidemia, antiphospholipid antibody syndrome, DVT, pulmonary embolism.  He presented on 03/20/2021 with sudden onset of speech difficulties and trouble finding words.  He presented be on time window for tPA.  MRI scan showed acute infarct in left frontal lobe and insular region.  CTA of the head and neck showed irregularity of superior division MCA with distal occlusion.  CT perfusion showed no significant penumbra.  Transthoracic echo showed normal ejection fraction without cardiac source of embolism.  LDL cholesterol elevated to 34 mg percent and hemoglobin A1c was 6.0.  Patient was on full dose Lovenox at home this was switched to warfarin with target INR goal between 2 and 3.  Patient has done well and has had no recurrent TIA or stroke symptoms.  However his INR has been fluctuating with last INR on 05/07/2021 being 1.8 and he is currently on 10 mg daily except he takes 12.5 on Mondays and Fridays upon direction from the Coumadin clinic.  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  He does use his CPAP machine regularly at night and he just got it recently.  He has no new complaints.  He has no prior history of strokes or TIAs.  He has prior history of DVT and pulmonary embolism and is on long-term anticoagulation for his antiphospholipid antibody syndrome.  And sees Dr.  Burney Hancock. Update 09/18/2021: He returns for follow-up after last visit 3 months ago.  Patient states he is doing well.  He has had no recurrent TIA or strokelike symptoms.  He remains on warfarin which is tolerating well without bruising or bleeding and his INR is quite stable at 2.5 today.  He had lab work done at last visit on 06/12/2021 which showed elevated total cholesterol of 280 and LDL cholesterol of 207 mg percent.  He is already on maximum dose Crestor 40 mg daily.  I had prescribed Repatha which he got a couple of months of samples but his insurance denied it but is willing to cover Praluent instead which he wants me to prescribe today.  He had transplant Doppler bubble study done on 07/02/2021 which was weakly positive suggestive of a trivial likely clinically nonsignificant right-to-left shunt.  He had previously had a 2D echo which had not shown any right-to-left shunt.  Patient is bothered by his back pain which he has since found to be due to stress fractures and is currently undergoing physical therapy and finds it helpful.  He has no new complaints today. ROS:   14 system review of systems is positive for back pain, speech difficulties, word finding difficulties, snoring, sleep apnea and all other systems negative  PMH:  Past Medical History:  Diagnosis Date   Acute nonintractable headache 09/06/2019   Acute respiratory failure with hypoxia ([place]) 08/30/2019   AKI (acute kidney injury) ([place]) 08/30/2019   Antiphospholipid syndrome ([place]) 10/30/2019  Anxiety    APS (antiphospholipid syndrome) ([name])    Arthritis    Colon polyp    ? hyperplastic   Depression    Diarrhea 05/21/2020   DVT (deep venous thrombosis) ([name])    Dyspnea on exertion 09/08/2017   "Quit smoking" 2015 with onset of symptoms in 2016  Spirometry 09/08/2017  Flat f/v loop  - d/c acei 09/08/2017  - 01/04/2020   Walked RA x two laps =  approx 541ft @ fast pace - stopped due to end of study/ min sob with sats of 93 %  at the end of the study. - PFT's  03/08/20   FEV1 3.84 (83 % ) ratio 0..90  p 3 % improvement from saba p ? 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 06/26/20, 03/08/20   Snoring 07/09/2020   Stress fracture    in back, seeing PT   Stroke ([place])    Upper airway cough syndrome 01/05/2020   Onset mid Jan 2021 while on otc PPI  - max rx for gerd 01/04/2020 >>>      Witnessed apneic spells 07/09/2020    Social History:  Social History   Socioeconomic History   Marital status: Married    Spouse name: Wesley Hancock   Number of children: 3   Years of education: Not on file   Highest education level: Not on file  Occupational History   Occupation: Truck driver  Tobacco Use   Smoking status: Former    Packs/day: 1.00    Years: 20.00    Pack years: 20.00    Types: Cigarettes    Quit date: 11/30/2013    Years since quitting: 7.8   Smokeless tobacco: Former    Types: Snuff    Quit date: 05/21/2018  Vaping Use   Vaping Use: Never used  Substance and Sexual Activity   Alcohol use: Not Currently   Drug use: No   Sexual activity: Yes  Other Topics Concern   Not on  file  Social History Narrative   Lives with wife and daughter at home   Right Handed   Drinks about 2 cups caffeine daily   Social Determinants of Health   Financial Resource Strain: Not on file  Food Insecurity: Not on file  Transportation Needs: Not on file  Physical Activity: Not on file  Stress: Not on file  Social Connections: Not on file  Intimate Partner Violence: Not on file    Medications:   Current Outpatient Medications on File Prior to Visit  Medication Sig Dispense Refill   acetaminophen (TYLENOL) 500 MG tablet Take 1,000 mg  by mouth every 6 (six) hours as needed for mild pain.     albuterol (VENTOLIN HFA) 108 (90 Base) MCG/ACT inhaler Inhale 2 puffs into the lungs every 6 (six) hours as needed for wheezing or shortness of breath. 8 g 6   buPROPion (WELLBUTRIN XL) 150 MG 24 hr tablet Take 1 tablet (150 mg total) by mouth daily. Take 1 tablet daily 90 tablet 1   escitalopram (LEXAPRO) 20 MG tablet Take 1 tablet (20 mg total) by mouth daily. 90 tablet 1   fluticasone (FLONASE) 50 MCG/ACT nasal spray Place 1 spray into both nostrils daily as needed for allergies.     hydroxychloroquine (PLAQUENIL) 200 MG tablet TAKE 1 TABLET BY MOUTH TWICE A DAY 180 tablet 0   mycophenolate (CELLCEPT) 500 MG tablet Take 2 tablets (1,000 mg total) by mouth 2 (two) times daily. 360 tablet 3   pantoprazole (PROTONIX) 40 MG tablet Take 1 tablet (40 mg total) by mouth daily. 90 tablet 3   Probiotic Product (PROBIOTIC PO) Take 1 capsule by mouth daily.     telmisartan-hydrochlorothiazide (MICARDIS HCT) 80-25 MG tablet TAKE 1 TABLET BY MOUTH EVERY DAY 90 tablet 1   warfarin (COUMADIN) 5 MG tablet Take 2 tablets daily except take 3 tablets on Mon and Thurs or Take as directed by anticoagulation clinic 195 tablet 1   No current facility-administered medications on file prior to visit.    Allergies:   Allergies  Allergen Reactions   Diltiazem Hcl Diarrhea and Other (See Comments)    Lethargic      Physical Exam General: Obese middle-age Caucasian male, seated, in no evident distress Head: head normocephalic and atraumatic.   Neck: supple with no carotid or supraclavicular bruits Cardiovascular: regular rate and rhythm, no murmurs Musculoskeletal: no deformity Skin:  no rash/petichiae Vascular:  Normal pulses all extremities  Neurologic Exam Mental Status: Awake and fully alert. Oriented to place and time. Recent and remote memory intact. Attention span, concentration and fund of knowledge appropriate. Mood and affect appropriate.  Speech is clear without any expressive or receptive difficulties.  Able to name 15 animals that can walk on 4 legs.  Good repetition and naming Cranial Nerves: Fundoscopic exam not done s. Pupils equal, briskly reactive to light. Extraocular movements full without nystagmus. Visual fields full to confrontation. Hearing intact. Facial sensation intact. Face, tongue, palate moves normally and symmetrically.  Motor: Normal bulk and tone. Normal strength in all tested extremity muscles. Sensory.: intact to touch , pinprick , position and vibratory sensation.  Coordination: Rapid alternating movements normal in all extremities. Finger-to-nose and heel-to-shin performed accurately bilaterally. Gait and Station: Arises from chair without difficulty. Stance is normal. Gait demonstrates normal stride length and balance . Able to heel, toe and tandem walk without difficulty.  Reflexes: 1+ and symmetric. Toes downgoing.      ASSESSMENT: 49 year old male with left frontal MCA branch infarct and April 2022 likely due to antiphospholipid antibody syndrome.  Vascular risk factors of obesity, hyperlipidemia, hypertension, , sleep apnea and hypercoagulability from antiphospholipid antibody syndrome     PLAN: I had a long d/w patient about his recent stroke, antiphospholipid antibody syndrome risk for recurrent stroke/TIAs, personally independently reviewed imaging  studies and stroke evaluation results and answered questions.Continue warfarin daily  for secondary stroke prevention with target INR between 2 and 3 and maintain strict control of hypertension with blood pressure goal below 130/90, diabetes with hemoglobin A1c goal below 6.5% and lipids with LDL cholesterol goal below 70 mg/dL.  I also advised the patient to eat a healthy diet with plenty of whole grains, cereals, fruits and vegetables, exercise regularly and maintain ideal body weight .recommend he start injection Praluent 150 mg every 2 weeks in addition to his Crestor since his cholesterol is still not optimally controlled.  Followup in the future with me in 6 months or call earlier if necessary.Greater than 50% time during this 25-minute visit was spent on counseling and coordination of care about his stroke, new medication Praluent and discussion about stroke prevention Wesley Contras, MD Note: This document was prepared with digital dictation and possible smart phrase technology. Any transcriptional errors that result from this process are unintentional.

## 2021-09-18 NOTE — Patient Instructions (Addendum)
Pre visit review using our clinic review tool, if applicable. No additional management support is needed unless otherwise documented below in the visit note.  Continue to take 2 tablets every day except 3 tablets on Monday and Thursdays.  Re-check in 4 weeks.

## 2021-09-22 ENCOUNTER — Telehealth: Payer: Self-pay

## 2021-09-22 ENCOUNTER — Encounter (HOSPITAL_BASED_OUTPATIENT_CLINIC_OR_DEPARTMENT_OTHER): Payer: BC Managed Care – PPO | Admitting: Physical Therapy

## 2021-09-22 NOTE — Telephone Encounter (Signed)
I submitted a PA request for Praluent on CMM,  Key: [id number] - PA Case ID: [id number].  Awaiting determination from [place].

## 2021-09-22 NOTE — Telephone Encounter (Signed)
Praluent approved from 09/22/21-1024/23. Pharmacy made aware.

## 2021-09-24 ENCOUNTER — Ambulatory Visit (HOSPITAL_BASED_OUTPATIENT_CLINIC_OR_DEPARTMENT_OTHER): Payer: BC Managed Care – PPO | Admitting: Physical Therapy

## 2021-10-06 NOTE — Progress Notes (Signed)
Wesley Hancock is a 50 y.o. male here for a follow up of chronic back pain.  History of Present Illness:   Chief Complaint  Patient presents with   Back Pain    HPI  Chronic Back Pain Wesley Hancock presents for f/u of chronic bilateral low back pain that began shortly after his CVA in April of 2022. Pt reports worsening lower back pain along his spine, radiating to the right side. Wesley Hancock has described back pain being so severe that he has trouble getting out of bed on some days as well as standing up from chairs. Initially he was also experiencing lower back pain toward his hips but this has been gradually resolving itself since participating in PT with Wesley Hancock and Wesley Hancock.   Wesley Hancock found major pain relief when it comes to working his lower back but while working his upper back in PT, he finds this to be aggravating.   Upon having a chest CT [place] with pulmonary on 08/21/21, he was found to have a new but nonacute compression fracture of superior endplate of V61 with 60% loss of anterior vertebral body height. He had required significant use of prednisone over the year due to multiple chronic medical issues, mainly since Feb 2022.   HLD/CAD An additional result of his most recent CT was aortic atherosclerosis, in addition to 3 vessel coronary artery disease. He has not seen cardiology in about 1 year. Currently compliant with praluent 150 mg and warfarin 5 mg with no adverse effects. He is regularly following up with Wesley Hancock, Neurology, and is managing well. He was previously prescribed crestor but not taking due to cost. He would like to restart lipitor as this was more affordable.   Skin tags [name] has expressed he has been experiencing additional acrochordons as well as scattered lesions that he would like further investigated. He is interested in receiving a referral for dermatology.    Past Medical History:  Diagnosis Date   Acute nonintractable headache 09/06/2019    Acute respiratory failure with hypoxia ([place]) 08/30/2019   AKI (acute kidney injury) ([place]) 08/30/2019   Antiphospholipid syndrome ([place]) 10/30/2019   Anxiety    APS (antiphospholipid syndrome) ([place])    Arthritis    Colon polyp    ? hyperplastic   Depression    Diarrhea 05/21/2020   DVT (deep venous thrombosis) ([name])    Dyspnea on exertion 09/08/2017   "Quit smoking" 2015 with onset of symptoms in 2016  Spirometry 09/08/2017  Flat f/v loop  - d/c acei 09/08/2017  - 01/04/2020   Walked RA x two laps =  approx 51ft @ fast pace - stopped due to end of study/ min sob with sats of 93 % at the end of the study. - PFT's  03/08/20   FEV1 3.84 (83 % ) ratio 0..90  p 3 % improvement from saba p ? 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 06/26/20, 03/08/20   Snoring 07/09/2020   Stress fracture    in back, seeing PT   Stroke ([place])    Upper airway cough syndrome 01/05/2020   Onset mid Jan 2021 while on otc PPI  - max rx for gerd 01/04/2020 >>>      Witnessed apneic spells 07/09/2020     Social History   Tobacco Use   Smoking status: Former    Packs/day: 1.00     Years: 20.00    Pack years: 20.00    Types: Cigarettes    Quit date: 11/30/2013    Years since quitting: 7.8   Smokeless tobacco: Former    Types: Snuff    Quit date: 05/21/2018  Vaping Use   Vaping Use: Never used  Substance Use Topics   Alcohol use: Not Currently   Drug use: No    Past Surgical History:  Procedure Laterality Date   BRONCHIAL WASHINGS  01/14/2021   Procedure: BRONCHIAL WASHINGS;  Surgeon: Freddi Starr, MD;  Location: [facility];  Service: Pulmonary;;   CHOLECYSTECTOMY     VIDEO BRONCHOSCOPY N/A 01/14/2021   Procedure: VIDEO BRONCHOSCOPY WITHOUT FLUORO;  Surgeon: Freddi Starr, MD;  Location: [facility];  Service: Pulmonary;  Laterality: N/A;    Family History  Problem Relation Age of Onset   Diabetes Mother    Hyperlipidemia Mother    Hypertension Mother    Thyroid disease Mother        uncertain type--had surg--no cancer   Heart disease Father 97   Hyperlipidemia Father    Hypertension Father    Prostate cancer Father 44   Lung cancer Father 28       Dx 06/18/2017   Colon polyps Father    Irritable bowel syndrome Father    Diverticulitis Father    Diabetes Sister    Hyperlipidemia Sister    Hypertension Sister    Diabetes Maternal Grandmother    Heart disease Maternal Grandmother    Hyperlipidemia Maternal Grandmother    Hypertension Maternal Grandmother    Colon cancer Maternal Grandmother    Heart disease Maternal Grandfather    Hyperlipidemia Maternal Grandfather    Hypertension Maternal Grandfather    Stroke Maternal Grandfather    Liver cancer Maternal Grandfather    Irritable bowel syndrome Maternal Grandfather    Heart disease Paternal Grandmother    Healthy Son    Healthy Daughter    Healthy Daughter     Allergies  Allergen Reactions   Diltiazem Hcl Diarrhea and Other (See Comments)    Lethargic     Current Medications:   Current Outpatient Medications:    acetaminophen (TYLENOL) 500 MG tablet, Take 1,000 mg by  mouth every 6 (six) hours as needed for mild pain., Disp: , Rfl:    albuterol (VENTOLIN HFA) 108 (90 Base) MCG/ACT inhaler, Inhale 2 puffs into the lungs every 6 (six) hours as needed for wheezing or shortness of breath., Disp: 8 g, Rfl: 6   buPROPion (WELLBUTRIN XL) 150 MG 24 hr tablet, Take 1 tablet (150 mg total) by mouth daily. Take 1 tablet daily, Disp: 90 tablet, Rfl: 1   escitalopram (LEXAPRO) 20 MG  tablet, Take 1 tablet (20 mg total) by mouth daily., Disp: 90 tablet, Rfl: 1   fluticasone (FLONASE) 50 MCG/ACT nasal spray, Place 1 spray into both nostrils daily as needed for allergies., Disp: , Rfl:    Multiple Vitamin (MULTIVITAMIN) tablet, Take by mouth daily. 2 chews daily, Disp: , Rfl:    mycophenolate (CELLCEPT) 500 MG tablet, Take 2 tablets (1,000 mg total) by mouth 2 (two) times daily., Disp: 360 tablet, Rfl: 3   pantoprazole (PROTONIX) 40 MG tablet, Take 1 tablet (40 mg total) by mouth daily., Disp: 90 tablet, Rfl: 3   telmisartan-hydrochlorothiazide (MICARDIS HCT) 80-25 MG tablet, TAKE 1 TABLET BY MOUTH EVERY DAY, Disp: 90 tablet, Rfl: 1   warfarin (COUMADIN) 5 MG tablet, Take 2 tablets daily except take 3 tablets on Mon and Thurs or Take as directed by anticoagulation clinic, Disp: 195 tablet, Rfl: 1   Alirocumab (PRALUENT) 150 MG/ML SOAJ, Inject 150 mg into the skin every 14 (fourteen) days. (Patient not taking: Reported on 10/07/2021), Disp: 2 mL, Rfl: 3   hydroxychloroquine (PLAQUENIL) 200 MG tablet, TAKE 1 TABLET BY MOUTH TWICE A DAY (Patient not taking: Reported on 10/07/2021), Disp: 180 tablet, Rfl: 0   Review of Systems:   ROS Negative unless otherwise specified per HPI. Vitals:   Vitals:   10/07/21 0916  BP: 122/80  Pulse: 84  Temp: 98.1 F (36.7 C)  TempSrc: Temporal  SpO2: 96%  Weight: 299 lb (135.6 kg)  Height: 6\' 2"  (1.88 m)     Body mass index is 38.39 kg/m.  Physical Exam:   Physical Exam Vitals and nursing note reviewed.  Constitutional:       General: He is not in acute distress.    Appearance: He is well-developed. He is not ill-appearing or toxic-appearing.  Cardiovascular:     Rate and Rhythm: Normal rate and regular rhythm.     Pulses: Normal pulses.     Heart sounds: Normal heart sounds, S1 normal and S2 normal.  Pulmonary:     Effort: Pulmonary effort is normal.     Breath sounds: Normal breath sounds.  Skin:    General: Skin is warm and dry.     Comments: Multiple skin tags and irregular skin lesions throughout body  Neurological:     Mental Status: He is alert.     GCS: GCS eye subscore is 4. GCS verbal subscore is 5. GCS motor subscore is 6.  Psychiatric:        Speech: Speech normal.        Behavior: Behavior normal. Behavior is cooperative.    Assessment and Plan:   Compression fracture of T10 vertebra, initial encounter ([place]) New Suspect due to recent long-term steroid use Start calcium and vitamin D supplement  Order CMP and Vit D blood work today Likely start fosamax 70 mg weekly Will also refer to sports medicine for further evaluation  Dyslipidemia Uncontrolled Will restart statin lipitor 40 mg daily Praluent per neuro Follow-up with cardiology, encouraged patient to schedule visit  Skin lesion Referral to dermatology   I,Havlyn C Ratchford,acting as a scribe for Inda Coke, PA.,have documented all relevant documentation on the behalf of Inda Coke, PA,as directed by  Inda Coke, PA while in the presence of Inda Coke, [place].  I, Inda Coke, [place], have reviewed all documentation for this visit. The documentation on 10/07/21 for the exam, diagnosis, procedures, and orders are all accurate and complete.  Time spent with patient today was 50 minutes which consisted  of chart review, discussing diagnosis, work up, treatment answering questions and documentation.   Inda Coke, PA-C

## 2021-10-07 ENCOUNTER — Encounter: Payer: Self-pay | Admitting: Physician Assistant

## 2021-10-07 ENCOUNTER — Other Ambulatory Visit: Payer: Self-pay

## 2021-10-07 ENCOUNTER — Ambulatory Visit (INDEPENDENT_AMBULATORY_CARE_PROVIDER_SITE_OTHER): Payer: BC Managed Care – PPO | Admitting: Physician Assistant

## 2021-10-07 VITALS — BP 122/80 | HR 84 | Temp 98.1°F | Ht 74.0 in | Wt 299.0 lb

## 2021-10-07 DIAGNOSIS — L989 Disorder of the skin and subcutaneous tissue, unspecified: Secondary | ICD-10-CM

## 2021-10-07 DIAGNOSIS — Z23 Encounter for immunization: Secondary | ICD-10-CM | POA: Diagnosis not present

## 2021-10-07 DIAGNOSIS — E785 Hyperlipidemia, unspecified: Secondary | ICD-10-CM

## 2021-10-07 DIAGNOSIS — S22070A Wedge compression fracture of T9-T10 vertebra, initial encounter for closed fracture: Secondary | ICD-10-CM | POA: Diagnosis not present

## 2021-10-07 MED ORDER — ALENDRONATE SODIUM 70 MG PO TABS: 70.0000 mg | ORAL_TABLET | ORAL | 1 refills | Status: DC

## 2021-10-07 MED ORDER — ATORVASTATIN CALCIUM 40 MG PO TABS: 40.0000 mg | ORAL_TABLET | Freq: Every day | ORAL | 1 refills | Status: DC

## 2021-10-07 NOTE — Patient Instructions (Signed)
It was great to see you!  Please schedule an appointment with cardiology (Dr Bettina Gavia)  Please schedule a bone density scan on your way out with front desk  Please restart 40 mg lipitor  At a minimum, recommend 800 IU of vitamin D and 1200mg  of Calcium per day. You can get this with a calcium-vitamin D supplement.   Once you have the above in place, I would start taking fosamax 70mg  once a week (I will advise you of this!).  Administer first thing in the morning and >30 minutes before the first food, beverage (except plain water), or other medication of the day. Do not take with mineral water or with other beverages. Stay upright (not to lie down) for at least 30 minutes after taking medicine and until after first food of the day (to reduce irritation). Must be taken with 6 to 8 oz of plain water. The tablet should be swallowed whole; do not chew or suck.  Dermatology referral placed today  Dr. Clovis Riley office should be contacting you as well  Let's follow-up in 3 months, sooner if you have concerns.  If a referral was placed today, you will be contacted for an appointment. Please note that routine referrals can sometimes take up to 3-4 weeks to process. Please call our office if you haven't heard anything after this time frame.  Take care,  Inda Coke PA-C

## 2021-10-08 LAB — COMPREHENSIVE METABOLIC PANEL
ALT: 36 U/L (ref 0–53)
AST: 25 U/L (ref 0–37)
Albumin: 4.4 g/dL (ref 3.5–5.2)
Alkaline Phosphatase: 60 U/L (ref 39–117)
BUN: 18 mg/dL (ref 6–23)
CO2: 28 mEq/L (ref 19–32)
Calcium: 10.1 mg/dL (ref 8.4–10.5)
Chloride: 101 mEq/L (ref 96–112)
Creatinine, Ser: 1.1 mg/dL (ref 0.40–1.50)
GFR: 78.82 mL/min (ref >60.00)
Glucose, Bld: 98 mg/dL (ref 70–99)
Potassium: 4.6 mEq/L (ref 3.5–5.1)
Sodium: 138 mEq/L (ref 135–145)
Total Bilirubin: 0.4 mg/dL (ref 0.2–1.2)
Total Protein: 7.7 g/dL (ref 6.0–8.3)

## 2021-10-08 LAB — VITAMIN D 25 HYDROXY (VIT D DEFICIENCY, FRACTURES): VITD: 27.1 ng/mL — ABNORMAL LOW (ref 30.00–100.00)

## 2021-10-08 NOTE — Progress Notes (Signed)
Subjective:    CC: Mid-back pain  I, [name], LAT, ATC, am serving as scribe for Dr. [name].  HPI: Pt is a 50 y/o male presenting w/ c/o mid-low back pain that is associated w a T10 compression fx discovered during a chest CT on 08/21/21 thought to be associated w/ prolonged steroid use.  He has a hx of chronic LBP but has been treated conservatively for this w/ good results w/ PT.  Pt has a hx of a CVA in April 2022.  He was seen by his PCP for his mid-back pain on 10/07/21 and was referred to me for consultation.  Pt locates his pain to his lower T-spine at the T10 location.  He has been recently diagnosed w/ lupus.  He is a Forensic scientist.  Radiating pain: yes along his entire spine, particularly off to the R Aggravating factors: transitions; lumbar flexion; picking up heavier items Treatments tried: PT for his low back previously and currently for his hips; vitD3 and calcium; OTC NSAIDs  Diagnostic imaging: Chest CT- 08/21/21; L-spine XR- 07/24/21  Pertinent review of Systems: No fevers or chills  Relevant historical information: Interstitial lung disease.  History of DVT.  History of long-term steroid use in the past.  MND deficiency.   Objective:    Vitals:   10/09/21 0825  BP: 110/80  Pulse: 81   General: Well Developed, well nourished, and in no acute distress.   MSK: T-spine tender palpation around midline around T10.  Decreased thoracic motion.  Lab and Radiology Results Results for orders placed or performed in visit on 10/07/21 (from the past 72 hour(s))  VITAMIN D 25 Hydroxy (Vit-D Deficiency, Fractures)     Status: Abnormal   Collection Time: 10/07/21  3:11 PM  Result Value Ref Range   VITD 27.10 (L) 30.00 - 100.00 ng/mL  Comp Met (CMET)     Status: None   Collection Time: 10/07/21  3:11 PM  Result Value Ref Range   Sodium 138 135 - 145 mEq/L   Potassium 4.6 3.5 - 5.1 mEq/L   Chloride 101 96 - 112 mEq/L   CO2 28 19 - 32 mEq/L   Glucose,  Bld 98 70 - 99 mg/dL   BUN 18 6 - 23 mg/dL   Creatinine, Ser 1.10 0.40 - 1.50 mg/dL   Total Bilirubin 0.4 0.2 - 1.2 mg/dL   Alkaline Phosphatase 60 39 - 117 U/L   AST 25 0 - 37 U/L   ALT 36 0 - 53 U/L   Total Protein 7.7 6.0 - 8.3 g/dL   Albumin 4.4 3.5 - 5.2 g/dL   GFR 78.82 >60.00 mL/min    Comment: Calculated using the CKD-EPI Creatinine Equation (2021)   Calcium 10.1 8.4 - 10.5 mg/dL   EXAM: CT CHEST WITHOUT CONTRAST   TECHNIQUE: Multidetector CT imaging of the chest was performed following the standard protocol without intravenous contrast. High resolution imaging of the lungs, as well as inspiratory and expiratory imaging, was performed.   COMPARISON:  Chest CTA 01/13/2021.   FINDINGS: Cardiovascular: Heart size is normal. There is no significant pericardial fluid, thickening or pericardial calcification. There is aortic atherosclerosis, as well as atherosclerosis of the great vessels of the mediastinum and the coronary arteries, including calcified atherosclerotic plaque in the left anterior descending, left circumflex and right coronary arteries.   Mediastinum/Nodes: No pathologically enlarged mediastinal or hilar lymph nodes. Please note that accurate exclusion of hilar adenopathy is limited on noncontrast CT  scans. Esophagus is unremarkable in appearance. No axillary lymphadenopathy.   Lungs/Pleura: Compared to the prior examination, the previously noted patchy multifocal airspace disease has substantially regressed. In a similar peribronchovascular distribution most evident throughout the mid to lower lungs there continues to be some much more subtle ground-glass attenuation and some very mild septal thickening which is overall improved compared to the prior examination, with exception of slightly increased findings in the right middle lobe. No traction bronchiectasis or honeycombing. Inspiratory and expiratory imaging is unremarkable. No acute consolidative  airspace disease. No pleural effusions. 4 mm subpleural nodule in the periphery of the right upper lobe (axial image 51 of series 3), stable dating back to prior study from 08/30/2019, considered benign. No other larger more suspicious appearing pulmonary nodules or masses are noted.   Upper Abdomen: Diffuse low attenuation throughout the visualized hepatic parenchyma, indicative of hepatic steatosis. Status post cholecystectomy.   Musculoskeletal: New compression fracture of superior endplate of Z76 with 73% loss of anterior vertebral body height, without surrounding soft tissue swelling or well-defined fracture lines on today's study to suggest an acute fracture. There are no aggressive appearing lytic or blastic lesions noted in the visualized portions of the skeleton.   IMPRESSION: 1. Overall, when compared to prior studies the mild fibrotic changes in the lung parenchyma on today's exam have substantially improved. Findings are compatible with an alternative diagnosis (not usual interstitial pneumonia) per current ATS guidelines. Overall, the imaging findings are favored to reflect mild cryptogenic organizing pneumonia (COP) at this time. 2. Aortic atherosclerosis, in addition to 3 vessel coronary artery disease. Please note that although the presence of coronary artery calcium documents the presence of coronary artery disease, the severity of this disease and any potential stenosis cannot be assessed on this non-gated CT examination. Assessment for potential risk factor modification, dietary therapy or pharmacologic therapy may be warranted, if clinically indicated. 3. Hepatic steatosis. 4. New but nonacute compression fracture of superior endplate of A19 with 37% loss of anterior vertebral body height.   Aortic Atherosclerosis (ICD10-I70.0).     Electronically Signed   By: Vinnie Langton M.D.   On: 08/23/2021 07:23 I, [name], personally (independently) visualized  and performed the interpretation of the images attached in this note.     Impression and Recommendations:    Assessment and Plan: 50 y.o. male with thoracic compression fracture at T10.  It somewhat difficult to tell how long ago this occurred.  It could be anywhere from 3 to 6 months ago.  Given the questionable acuity of this injury we will plan for MRI T-spine to assess whether or not he is a good candidate for vertebroplasty or kyphoplasty.  If MRI shows that the injury is acute or subacute will refer to interventional radiology clinic phone [phone].  Discussed that these procedures will require him to temporarily stop his warfarin which he says is not a problem.  He is willing to proceed with this procedure and plan.  Additionally he is already in the process of evaluating for treating decreased bone mineral density.  It is almost certainly that he has osteoporosis.  Plan to increase vitamin D.  He may be a good candidate for Prolia or Evenity based on results of bone mineral density already ordered.  PDMP not reviewed this encounter. Orders Placed This Encounter  Procedures   MR THORACIC SPINE WO CONTRAST    T-spine MRI to assess previously seen T10 compression fx per CT scan    Standing Status:  Future    Standing Expiration Date:   10/09/2022    Order Specific Question:   What is the patient's sedation requirement?    Answer:   No Sedation    Order Specific Question:   Does the patient have a pacemaker or implanted devices?    Answer:   No    Order Specific Question:   Preferred imaging location?    Answer:   Product/process development scientist (table limit-350lbs)   No orders of the defined types were placed in this encounter.   Discussed warning signs or symptoms. Please see discharge instructions. Patient expresses understanding.   The above documentation has been reviewed and is accurate and complete [name], M.D.

## 2021-10-09 ENCOUNTER — Ambulatory Visit (INDEPENDENT_AMBULATORY_CARE_PROVIDER_SITE_OTHER): Payer: BC Managed Care – PPO | Admitting: Family Medicine

## 2021-10-09 ENCOUNTER — Encounter: Payer: Self-pay | Admitting: Family Medicine

## 2021-10-09 ENCOUNTER — Other Ambulatory Visit: Payer: Self-pay

## 2021-10-09 VITALS — BP 110/80 | HR 81 | Ht 74.0 in | Wt 303.2 lb

## 2021-10-09 DIAGNOSIS — S22070A Wedge compression fracture of T9-T10 vertebra, initial encounter for closed fracture: Secondary | ICD-10-CM | POA: Diagnosis not present

## 2021-10-09 DIAGNOSIS — E559 Vitamin D deficiency, unspecified: Secondary | ICD-10-CM | POA: Diagnosis not present

## 2021-10-09 NOTE — Patient Instructions (Addendum)
Nice to meet you.  I've referred you for an MRI.  Please let us know if you don't hear from them w/I one week regarding scheduling.  Follow-up: as needed.  I'll order the next procedure once I receive your MRI results.

## 2021-10-10 ENCOUNTER — Telehealth: Payer: Self-pay | Admitting: *Deleted

## 2021-10-10 ENCOUNTER — Encounter: Payer: Self-pay | Admitting: Physician Assistant

## 2021-10-10 NOTE — Telephone Encounter (Signed)
Spoke to pt's wife Ander Purpura told her to let pt know I have his letter ready for pickup will put at the front desk. Lauren verbalized understanding and will let pt know.

## 2021-10-10 NOTE — Telephone Encounter (Signed)
Pt called back about lab results and also discussed needing a letter for work about Short term disability and why he was out at those times.

## 2021-10-13 NOTE — Progress Notes (Signed)
Office Visit Note  Patient: Wesley Hancock             Date of Birth: 10/23/1971           MRN: [id number]             PCP: Inda Coke, PA Referring: Inda Coke, PA Visit Date: 10/27/2021 Occupation: @GUAROCC @  Subjective:  Medication monitoring   History of Present Illness: Wesley Hancock is a 50 y.o. male with history of mixed connective tissue disease and ILD. He is taking CellCept 1 g p.o. twice daily and is tolerating it without any side effects.  He briefly took plaquenil but discontinued per recommendations of his pharmacist and cardiologist.  He is no longer taking prednisone.  He continues to experience intermittent shortness of breath and coughing but overall his symptoms of ILD have been tolerable.  He states that the CT in September 2020 had revealed improvement compared to previous studies.  He denies any recent infections.  He states he has had some intermittent symptoms of Raynaud's with colder weather temperatures.  He denies any recent rashes.  He has noticed increase stiffness in both hands as well as in his knee joints.  He continues to have chronic thoracic spinal pain.  He states that he has an upcoming bone density scheduled on Friday.  His primary care has discussed starting him on Fosamax due to history of vertebral fractures.  He states that he is no longer going to physical therapy since it was worsening his thoracic pain.  He states that PT did help with his lower back pain and he has continued home exercises for her lower back as well as trochanteric bursitis of both hips.  His symptoms have been tolerable overall in his lower back and hips.    Activities of Daily Living:  Patient reports morning stiffness for 0 minutes  Patient Reports nocturnal pain.  Difficulty dressing/grooming: Denies Difficulty climbing stairs: Denies Difficulty getting out of chair: Denies Difficulty using hands for taps, buttons, cutlery, and/or writing:  Denies  Review of Systems  Constitutional:  Positive for fatigue. Negative for night sweats.  HENT:  Negative for mouth sores, mouth dryness and nose dryness.   Eyes:  Negative for redness and dryness.  Respiratory:  Positive for shortness of breath. Negative for cough, hemoptysis and difficulty breathing.   Cardiovascular:  Negative for chest pain, palpitations, hypertension, irregular heartbeat and swelling in legs/feet.  Gastrointestinal:  Negative for blood in stool, constipation and diarrhea.  Endocrine: Negative for increased urination.  Genitourinary:  Negative for painful urination.  Musculoskeletal:  Positive for joint pain, joint pain and joint swelling. Negative for myalgias, muscle weakness, morning stiffness, muscle tenderness and myalgias.  Skin:  Positive for color change. Negative for rash, hair loss, nodules/bumps, skin tightness, ulcers and sensitivity to sunlight.  Allergic/Immunologic: Negative for susceptible to infections.  Neurological:  Negative for dizziness, fainting, memory loss, night sweats and weakness.  Hematological:  Negative for swollen glands.  Psychiatric/Behavioral:  Negative for depressed mood and sleep disturbance. The patient is not nervous/anxious.    PMFS History:  Patient Active Problem List   Diagnosis Date Noted   Vitamin D deficiency 10/09/2021   Compression fracture of T10 vertebra ([place]) 10/09/2021   ILD (interstitial lung disease) ([place]) 05/27/2021   Long term (current) use of anticoagulants 03/26/2021   CVA (cerebral vascular accident) ([place]) 03/20/2021   Mixed connective tissue disease ([place]) 01/21/2021   Hyperglycemia 01/16/2021   Multinodular goiter 01/10/2021   OSA  on CPAP 10/11/2020   Esophageal spasm    Colon polyp    Arthritis    APS (antiphospholipid syndrome) ([name])    Anxiety    CKD (chronic kidney disease) stage 2, GFR 60-89 ml/min 07/11/2020   Paresthesia of right foot 11/08/2019   Morbid obesity due to excess calories  ([place]) c/b hbp/ dvt/PE 09/10/2019   DVT (deep venous thrombosis) ([place]) 08/30/2019   Multiple pulmonary emboli ([place]) 08/22/2019   Depression 06/24/2017   Dyslipidemia 09/18/2015   Hypertensive heart disease 09/23/2014   Nutcracker esophagus 09/21/2014   Lung nodule 09/21/2014    Past Medical History:  Diagnosis Date   Acute nonintractable headache 09/06/2019   Acute respiratory failure with hypoxia ([place]) 08/30/2019   AKI (acute kidney injury) ([place]) 08/30/2019   Antiphospholipid syndrome ([place]) 10/30/2019   Anxiety    APS (antiphospholipid syndrome) ([place])    Arthritis    Colon polyp    ? hyperplastic   Depression    Diarrhea 05/21/2020   DVT (deep venous thrombosis) ([name])    Dyspnea on exertion 09/08/2017   "Quit smoking" 2015 with onset of symptoms in 2016  Spirometry 09/08/2017  Flat f/v loop  - d/c acei 09/08/2017  - 01/04/2020   Walked RA x two laps =  approx 548ft @ fast pace - stopped due to end of study/ min sob with sats of 93 % at the end of the study. - PFT's  03/08/20   FEV1 3.84 (83 % ) ratio 0..90  p 3 % improvement from saba p ? 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 06/26/20, 03/08/20   Snoring 07/09/2020   Stress fracture    in back, seeing PT   Stroke ([place])    Upper airway cough syndrome 01/05/2020   Onset mid Jan 2021 while on otc PPI  - max rx for gerd 01/04/2020 >>>      Witnessed apneic spells 07/09/2020    Family History  Problem Relation Age of Onset   Diabetes Mother    Hyperlipidemia Mother    Hypertension Mother    Thyroid disease Mother        uncertain type--had surg--no cancer   Heart disease Father 25   Hyperlipidemia Father    Hypertension Father    Prostate cancer Father 50   Lung cancer Father 58       Dx 06/18/2017   Colon polyps Father    Irritable bowel syndrome Father    Diverticulitis Father    Diabetes Sister    Hyperlipidemia Sister    Hypertension Sister    Diabetes Maternal  Grandmother    Heart disease Maternal Grandmother    Hyperlipidemia Maternal Grandmother    Hypertension Maternal Grandmother    Colon cancer Maternal Grandmother    Heart disease Maternal Grandfather    Hyperlipidemia Maternal Grandfather    Hypertension Maternal Grandfather    Stroke Maternal Grandfather    Liver cancer Maternal Grandfather    Irritable bowel syndrome Maternal Grandfather    Heart disease Paternal Grandmother    Healthy Son    Healthy Daughter    Healthy Daughter    Past Surgical History:  Procedure Laterality Date   BRONCHIAL WASHINGS  01/14/2021   Procedure: BRONCHIAL WASHINGS;  Surgeon: Freddi Starr, MD;  Location: [facility];  Service: Pulmonary;;   CHOLECYSTECTOMY     VIDEO BRONCHOSCOPY N/A 01/14/2021   Procedure: VIDEO BRONCHOSCOPY WITHOUT FLUORO;  Surgeon: Freddi Starr, MD;  Location: [facility];  Service: Pulmonary;  Laterality: N/A;   Social History   Social History Narrative   Lives with wife and daughter at home   Right Handed   Drinks about 2 cups caffeine daily   Immunization History  Administered Date(s) Administered   Influenza Inj Mdck Quad Pf  12/13/2020   Influenza,inj,Quad PF,6+ Mos 09/14/2016, 09/08/2017, 09/06/2019, 10/07/2021   Influenza,inj,quad, With Preservative 09/18/2015   PFIZER(Purple Top)SARS-COV-2 Vaccination 02/19/2020, 03/04/2020, 12/13/2020, 07/03/2021   Td 11/11/2016   Tdap 02/28/2019     Objective: Vital Signs: BP 131/89 (BP Location: Right Arm, Patient Position: Sitting, Cuff Size: Normal)   Pulse 85   Resp 15   Ht 6\' 2"  (1.88 m)   Wt 298 lb (135.2 kg)   BMI 38.26 kg/m    Physical Exam Vitals and nursing note reviewed.  Constitutional:      Appearance: He is well-developed.  HENT:     Head: Normocephalic and atraumatic.  Eyes:     Conjunctiva/sclera: Conjunctivae normal.     Pupils: Pupils are equal, round, and reactive to light.  Cardiovascular:     Rate and Rhythm: Normal rate and regular rhythm.     Heart sounds: Normal heart sounds.  Pulmonary:     Effort: Pulmonary effort is normal.     Breath sounds: Normal breath sounds.  Abdominal:     General: Bowel sounds are normal.     Palpations: Abdomen is soft.  Musculoskeletal:     Cervical back: Normal range of motion and neck supple.  Skin:    General: Skin is warm and dry.     Capillary Refill: Capillary refill takes less than 2 seconds.  Neurological:     Mental Status: He is alert and oriented to person, place, and time.  Psychiatric:        Behavior: Behavior normal.     Musculoskeletal Exam: C-spine has good range of motion.  Painful range of motion of thoracic spine.  Midline thoracic spinal tenderness.  No midline spinal tenderness in the lumbar region.  No tenderness over SI joints.  Shoulder joints, elbow joints, wrist joints, MCPs, PIPs, DIPs have good range of motion with no synovitis.  Complete fist formation bilaterally.  PIP and DIP prominence consistent with osteoarthritis of both hands especially in the right third and fourth PIP joints.  Hip joints have good range of motion with no groin pain.  No tenderness over bilateral  trochanteric bursa.  Knee joints have good range of motion with no warmth or effusion.  Ankle joints have good range of motion with no tenderness or joint swelling.  CDAI Exam: CDAI  Score: -- Patient Global: --; Provider Global: -- Swollen: --; Tender: -- Joint Exam 10/27/2021   No joint exam has been documented for this visit   There is currently no information documented on the homunculus. Go to the Rheumatology activity and complete the homunculus joint exam.  Investigation: No additional findings.  Imaging: No results found.  Recent Labs: Lab Results  Component Value Date   WBC 9.5 05/27/2021   HGB 14.8 05/27/2021   PLT 317.0 05/27/2021   NA 138 10/07/2021   K 4.6 10/07/2021   CL 101 10/07/2021   CO2 28 10/07/2021   GLUCOSE 98 10/07/2021   BUN 18 10/07/2021   CREATININE 1.10 10/07/2021   BILITOT 0.4 10/07/2021   ALKPHOS 60 10/07/2021   AST 25 10/07/2021   ALT 36 10/07/2021   PROT 7.7 10/07/2021   ALBUMIN 4.4 10/07/2021   CALCIUM 10.1 10/07/2021   GFRAA 84 10/14/2020   QFTBGOLDPLUS Negative 01/15/2021    Speciality Comments: Plaquenil 200 mg p.o. twice daily started March 26, 2021  Procedures:  No procedures performed Allergies: Diltiazem hcl    Assessment / Plan:     Visit Diagnoses: Mixed connective tissue disease ([place]) - +ANA,+dsDNA,+RNP,+aCL,+LA, +beta-2 GP 1, +aPS. H/o CVA,PE,DVTs,ILD,arthralgias.He denies any history of oral ulcers, nasal ulcers, malar rash, photosensitivity, raynaud's phenomenon: He has not had any signs or symptoms of a flare since his last office visit on 07/24/2021.  He has clinically been doing well taking CellCept 500 mg 2 tablets by mouth twice daily.  He continues to tolerate CellCept without any side effects.  He is no longer taking prednisone.  He is not taking Bactrim as prophylaxis.  He has not had any signs or symptoms of an infection recently.  He tried taking Plaquenil briefly as combination therapy but discontinued as  recommended by his cardiologist. He has not had any recent rashes.  He has had an increased frequency of Raynaud's with cooler weather temperatures.  No digital ulcerations, sclerodactyly, or signs of gangrene were noted.  He has not had any oral or nasal ulcerations.  No sicca symptoms.  His lungs were clear to auscultation on examination today.  He experiences shortness of breath intermittently especially with exertion. Echocardiogram updated on 03/22/21. PFTs updated on 03/25/21. High resolution chest CT updated on 08/21/21: Overall, when compared to prior studies the mild fibrotic changes in the lung parenchyma on today's exam have substantially improved.  Followed by Dr. Erin Fulling.  He will be having updated lab work with his PCP at his upcoming appointment at which time he was advised to have CBC with differential and CMP with GFR updated for routine monitor while taking CellCept. He will continue to require AVISE every 6 months.  Orders were provided to the patient today, which he plans on having completed once he has new insurance.  He was advised to notify us if he develops signs or symptoms of a flare.  He will remain on the current treatment regimen.  He will follow-up in the office in 5 months.  ILD (interstitial lung disease) ([place]) - He is on CellCept by Dr. Erin Fulling.  His pulmonary symptoms have improved since starting on CellCept.  He is no longer taking prednisone.  CT chest showed bilateral groundglass infiltrates but overall the mild fibrotic changes substantially improved on 08/21/2021 compared to previous studies.  He will remain on CellCept as prescribed.  APS (antiphospholipid syndrome) ([name]) - Positive lupus anticoagulant, antiphosphatidylserine, positive anticardiolipin, positive beta-2 GP 1. Hx of DVT, PE, CVA.  He is on lifelong coumadin. He was given an order to update AVISE once he has new insurance.   High risk medication use - He is taking CellCept 1 g p.o. twice daily as monotherapy.   He is no longer on prednisone.  He is not currently taking Bactrim as prophylaxis.  He has not had any recent infections.  Discussed the importance of holding CellCept due to develop signs or symptoms of an infection and to resume once the infection has completely cleared.  He took Plaquenil briefly but was advised to discontinue by his pharmacist as well as cardiologist.CMP updated on 10/17/21.  CBC updated on 05/27/21.  He will be having updated CBC with diff and CMP with GFR at his upcoming appt with his PCP.  No baseline PLQ eye examination on file.   Chronic midline low back pain with bilateral sciatica - X-ray of the lumbar spine showed mild spondylosis and possible old T12 and L1 fracture with wedging of the vertebrae and facet joint arthropathy. Referred to PT which improved his lower back pain significantly.  He has no midline spinal tenderness at this time.  No symptoms of radiculopathy.  He continues home exercises daily.  Trochanteric bursitis of both hips: He was referred to physical therapy after his last office visit on 07/24/2021.  He notes significant improvement in his symptoms while going to physical therapy.  He continues to perform home exercises on a daily basis.  He was advised to notify us if his discomfort persists or worsens.  Cerebrovascular accident (CVA), unspecified mechanism ([place]) - He was started on Coumadin in April 2022 after CVA.  He developed expressive aphasia.  Deep vein thrombosis (DVT) of right lower extremity, unspecified chronicity, unspecified vein ([place]): He remains on Coumadin.  Multiple pulmonary emboli [facility]) - September 2019.  He remains on Coumadin.  Other medical conditions are listed as follows:  Pulmonary infiltrates  Hypertensive heart disease without heart failure  Multinodular goiter  Nutcracker esophagus  Mixed hyperlipidemia  GAD (generalized anxiety disorder)  Esophageal spasm  Other fatigue: Stable  OSA on CPAP  Orders: No  orders of the defined types were placed in this encounter.  No orders of the defined types were placed in this encounter.   Follow-Up Instructions: Return in 5 months (on 03/27/2022) for MCTD .   Ofilia Neas, PA-C  Note - This record has been created using Dragon software.  Chart creation errors have been sought, but may not always  have been located. Such creation errors do not reflect on  the standard of medical care.

## 2021-10-15 ENCOUNTER — Other Ambulatory Visit: Payer: Self-pay

## 2021-10-15 ENCOUNTER — Ambulatory Visit (INDEPENDENT_AMBULATORY_CARE_PROVIDER_SITE_OTHER): Payer: BC Managed Care – PPO

## 2021-10-15 DIAGNOSIS — Z7901 Long term (current) use of anticoagulants: Secondary | ICD-10-CM | POA: Diagnosis not present

## 2021-10-15 LAB — POCT INR: INR: 2.8 (ref 2.0–3.0)

## 2021-10-15 NOTE — Patient Instructions (Addendum)
Pre visit review using our clinic review tool, if applicable. No additional management support is needed unless otherwise documented below in the visit note.  Continue to take 2 tablets every day except 3 tablets on Monday and Thursdays.  Re-check in 6 weeks.

## 2021-10-15 NOTE — Progress Notes (Signed)
Continue to take 2 tablets every day except 3 tablets on Monday and Thursdays.  Re-check in 6 weeks.

## 2021-10-27 ENCOUNTER — Ambulatory Visit (INDEPENDENT_AMBULATORY_CARE_PROVIDER_SITE_OTHER): Payer: BC Managed Care – PPO | Admitting: Physician Assistant

## 2021-10-27 ENCOUNTER — Encounter: Payer: Self-pay | Admitting: Physician Assistant

## 2021-10-27 ENCOUNTER — Other Ambulatory Visit: Payer: Self-pay

## 2021-10-27 VITALS — BP 131/89 | HR 85 | Resp 15 | Ht 74.0 in | Wt 298.0 lb

## 2021-10-27 DIAGNOSIS — M351 Other overlap syndromes: Secondary | ICD-10-CM | POA: Diagnosis not present

## 2021-10-27 DIAGNOSIS — J849 Interstitial pulmonary disease, unspecified: Secondary | ICD-10-CM

## 2021-10-27 DIAGNOSIS — Z9989 Dependence on other enabling machines and devices: Secondary | ICD-10-CM

## 2021-10-27 DIAGNOSIS — D6861 Antiphospholipid syndrome: Secondary | ICD-10-CM | POA: Diagnosis not present

## 2021-10-27 DIAGNOSIS — R5383 Other fatigue: Secondary | ICD-10-CM

## 2021-10-27 DIAGNOSIS — I639 Cerebral infarction, unspecified: Secondary | ICD-10-CM

## 2021-10-27 DIAGNOSIS — Z79899 Other long term (current) drug therapy: Secondary | ICD-10-CM | POA: Diagnosis not present

## 2021-10-27 DIAGNOSIS — M7062 Trochanteric bursitis, left hip: Secondary | ICD-10-CM

## 2021-10-27 DIAGNOSIS — G4733 Obstructive sleep apnea (adult) (pediatric): Secondary | ICD-10-CM

## 2021-10-27 DIAGNOSIS — R918 Other nonspecific abnormal finding of lung field: Secondary | ICD-10-CM

## 2021-10-27 DIAGNOSIS — E782 Mixed hyperlipidemia: Secondary | ICD-10-CM

## 2021-10-27 DIAGNOSIS — M7061 Trochanteric bursitis, right hip: Secondary | ICD-10-CM

## 2021-10-27 DIAGNOSIS — F411 Generalized anxiety disorder: Secondary | ICD-10-CM

## 2021-10-27 DIAGNOSIS — G8929 Other chronic pain: Secondary | ICD-10-CM

## 2021-10-27 DIAGNOSIS — M5442 Lumbago with sciatica, left side: Secondary | ICD-10-CM

## 2021-10-27 DIAGNOSIS — M5441 Lumbago with sciatica, right side: Secondary | ICD-10-CM

## 2021-10-27 DIAGNOSIS — E042 Nontoxic multinodular goiter: Secondary | ICD-10-CM

## 2021-10-27 DIAGNOSIS — I119 Hypertensive heart disease without heart failure: Secondary | ICD-10-CM

## 2021-10-27 DIAGNOSIS — I2699 Other pulmonary embolism without acute cor pulmonale: Secondary | ICD-10-CM

## 2021-10-27 DIAGNOSIS — I82401 Acute embolism and thrombosis of unspecified deep veins of right lower extremity: Secondary | ICD-10-CM

## 2021-10-27 DIAGNOSIS — K224 Dyskinesia of esophagus: Secondary | ICD-10-CM

## 2021-10-27 NOTE — Progress Notes (Signed)
Wesley Hancock is a 50 y.o. male here for a follow up of chronic back pain and skin lesions  History of Present Illness:   Chief Complaint  Patient presents with   Back Pain   skin lesions    HPI  Interstitial Lung Disease He just recently lost his job and is going to lose his insurance. He needs 90 day supply of cellcept, 1000 mg BID. Would like refill of this medication.  Skin tags Has two skin tags under left axilla that he would like to remove. They keep getting caught on things and they bleed/cause pain.  Past Medical History:  Diagnosis Date   Acute nonintractable headache 09/06/2019   Acute respiratory failure with hypoxia ([place]) 08/30/2019   AKI (acute kidney injury) ([place]) 08/30/2019   Antiphospholipid syndrome ([place]) 10/30/2019   Anxiety    APS (antiphospholipid syndrome) ([place])    Arthritis    Colon polyp    ? hyperplastic   Depression    Diarrhea 05/21/2020   DVT (deep venous thrombosis) ([name])    Dyspnea on exertion 09/08/2017   "Quit smoking" 2015 with onset of symptoms in 2016  Spirometry 09/08/2017  Flat f/v loop  - d/c acei 09/08/2017  - 01/04/2020   Walked RA x two laps =  approx 577ft @ fast pace - stopped due to end of study/ min sob with sats of 93 % at the end of the study. - PFT's  03/08/20   FEV1 3.84 (83 % ) ratio 0..90  p 3 % improvement from saba p ? 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 06/26/20, 03/08/20   Snoring 07/09/2020   Stress fracture    in back, seeing PT   Stroke ([place])    Upper airway cough syndrome 01/05/2020   Onset mid Jan 2021 while on otc PPI  - max rx for gerd 01/04/2020 >>>      Witnessed apneic spells 07/09/2020     Social History   Tobacco Use   Smoking status: Former    Packs/day: 1.00    Years: 20.00    Pack years: 20.00    Types: Cigarettes    Quit date: 11/30/2013    Years since quitting: 7.9   Smokeless tobacco: Former    Types: Snuff    Quit date: 05/21/2018  Vaping Use   Vaping Use: Never used  Substance Use Topics   Alcohol use: Not Currently   Drug use: No    Past Surgical History:  Procedure Laterality Date   BRONCHIAL WASHINGS  01/14/2021   Procedure: BRONCHIAL WASHINGS;  Surgeon: Freda Jackson  B, MD;  Location: [facility];  Service: Pulmonary;;   CHOLECYSTECTOMY     VIDEO BRONCHOSCOPY N/A 01/14/2021   Procedure: VIDEO BRONCHOSCOPY WITHOUT FLUORO;  Surgeon: Freddi Starr, MD;  Location: [facility];  Service: Pulmonary;  Laterality: N/A;    Family History  Problem Relation Age of Onset   Diabetes Mother    Hyperlipidemia Mother    Hypertension Mother    Thyroid disease Mother        uncertain type--had surg--no cancer   Heart disease Father 52   Hyperlipidemia Father    Hypertension Father    Prostate cancer Father 55   Lung cancer Father 59       Dx 06/18/2017   Colon polyps Father    Irritable bowel syndrome Father    Diverticulitis Father    Diabetes Sister    Hyperlipidemia Sister    Hypertension Sister    Diabetes Maternal Grandmother    Heart disease Maternal Grandmother    Hyperlipidemia Maternal Grandmother    Hypertension  Maternal Grandmother    Colon cancer Maternal Grandmother    Heart disease Maternal Grandfather    Hyperlipidemia Maternal Grandfather    Hypertension Maternal Grandfather    Stroke Maternal Grandfather    Liver cancer Maternal Grandfather    Irritable bowel syndrome Maternal Grandfather    Heart disease Paternal Grandmother    Healthy Son    Healthy Daughter    Healthy Daughter     Allergies  Allergen Reactions   Diltiazem Hcl Diarrhea and Other (See Comments)    Lethargic     Current Medications:   Current Outpatient Medications:    acetaminophen (TYLENOL) 500 MG tablet, Take 1,000 mg by mouth every 6 (six) hours as needed for mild pain., Disp: , Rfl:    albuterol (VENTOLIN HFA) 108 (90 Base) MCG/ACT inhaler, Inhale 2 puffs into the lungs every 6 (six) hours as needed for wheezing or shortness of breath., Disp: 8 g, Rfl: 6   atorvastatin (LIPITOR) 40 MG tablet, Take 1 tablet (40 mg total) by mouth daily., Disp: 90 tablet, Rfl: 1   buPROPion (WELLBUTRIN XL) 150 MG 24 hr tablet, Take 1 tablet (150 mg total) by mouth daily. Take 1 tablet daily, Disp: 90 tablet, Rfl: 1   escitalopram (LEXAPRO) 20 MG tablet, Take 1 tablet (20 mg total) by mouth daily., Disp: 90 tablet, Rfl: 1   fluticasone (FLONASE) 50 MCG/ACT nasal spray, Place 1 spray into both nostrils daily as needed for allergies., Disp: , Rfl:    Multiple Vitamin (MULTIVITAMIN) tablet, Take by mouth daily. 2 chews daily, Disp: , Rfl:    mycophenolate (CELLCEPT) 500 MG tablet, Take 2 tablets (1,000 mg total) by mouth 2 (two) times daily., Disp: 360 tablet, Rfl: 3   pantoprazole (PROTONIX) 40 MG tablet, Take 1 tablet (40 mg total) by mouth daily., Disp: 90 tablet, Rfl: 3   telmisartan-hydrochlorothiazide (MICARDIS HCT) 80-25 MG tablet, TAKE 1 TABLET BY MOUTH EVERY DAY, Disp: 90 tablet, Rfl: 1   warfarin (COUMADIN) 5 MG tablet, Take 2 tablets daily except take 3 tablets on Mon and Thurs or Take as directed by anticoagulation clinic,  Disp: 195 tablet, Rfl: 1   alendronate (FOSAMAX) 70 MG tablet, Take 1 tablet (70 mg total) by mouth every 7 (seven) days. Take with a full glass of water on an empty stomach. (Patient not taking: Reported on 10/27/2021), Disp: 12 tablet, Rfl: 1   Alirocumab (PRALUENT) 150 MG/ML SOAJ, Inject 150 mg into the skin  every 14 (fourteen) days. (Patient not taking: Reported on 10/07/2021), Disp: 2 mL, Rfl: 3   Review of Systems:   ROS Negative unless otherwise specified per HPI.  Vitals:   Vitals:   10/28/21 0853  BP: 138/84  Pulse: 81  Temp: 98 F (36.7 C)  TempSrc: Temporal  SpO2: 94%  Weight: 300 lb (136.1 kg)  Height: 6\' 2"  (1.88 m)     Body mass index is 38.52 kg/m.  Physical Exam:   Physical Exam Vitals and nursing note reviewed.  Constitutional:      Appearance: He is well-developed.  HENT:     Head: Normocephalic.  Eyes:     Conjunctiva/sclera: Conjunctivae normal.     Pupils: Pupils are equal, round, and reactive to light.  Pulmonary:     Effort: Pulmonary effort is normal.  Musculoskeletal:        General: Normal range of motion.     Cervical back: Normal range of motion.  Skin:    General: Skin is warm and dry.     Comments: Two skin tags on superior aspect of L axilla  Neurological:     Mental Status: He is alert and oriented to person, place, and time.  Psychiatric:        Behavior: Behavior normal.        Thought Content: Thought content normal.        Judgment: Judgment normal.   Procedure: Skin Tag Removal  Consent:  Risks and benefits of therapy discussed with patient who voices understanding and agrees with planned care. No barriers to communication or understanding identified.  After obtaining informed consent, the patient's identity, procedure, and site were verified during a pause prior to proceeding with the minor surgical procedure as per universal protocol recommendations.    Meds, vitals, and allergies reviewed.   Area was cleaned with betadine  and alcohol swab. Approximately 1/2 cc of 2% lidocaine with epi injected into base of skin tag. Lesion was then shaved and removed with forceps.  Number of skin tags removed and location: 2 in left axilla  Approximate loss of bleeding is less than 1cc.  Silver nitrate applied to area. Patient tolerated procedure well without any apparent complications. Sterile bandage placed.   Assessment and Plan:   Skin tag, acquired Tolerated procedure well Aftercare, including wound care, risks of bleeding, and risks of recurrence were discussed. All questions answered.  Return for retreatment as needed for further evaluation and management.    ILD (interstitial lung disease) ([place]) Courtesy refill today of cellcept 1000 mg BID for patient Follow-up with pulmonary as scheduled   Inda Coke, PA-C

## 2021-10-27 NOTE — Patient Instructions (Addendum)
Standing Labs We placed an order today for your standing lab work.   Please have your standing labs drawn every 3 months   CBC with diff and CMP with GFR    If possible, please have your labs drawn 2 weeks prior to your appointment so that the provider can discuss your results at your appointment.  Please note that you may see your imaging and lab results in [facility] before we have reviewed them. We may be awaiting multiple results to interpret others before contacting you. Please allow our office up to 72 hours to thoroughly review all of the results before contacting the office for clarification of your results.  We have open lab daily: Monday through Thursday from 1:30-4:30 PM and Friday from 1:30-4:00 PM at the office of Dr. Bo Merino, [facility].   Please be advised, all patients with office appointments requiring lab work will take precedent over walk-in lab work.  If possible, please come for your lab work on Monday and Friday afternoons, as you may experience shorter wait times. The office is located at [address] [place], [place], [place] [address] No appointment is necessary.   Labs are drawn by [facility]. Please bring your co-pay at the time of your lab draw.  You may receive a bill from [facility] for your lab work.  If you wish to have your labs drawn at another location, please call the office 24 hours in advance to send orders.  If you have any questions regarding directions or hours of operation,  please call [phone].   As a reminder, please drink plenty of water prior to coming for your lab work. Thanks!

## 2021-10-28 ENCOUNTER — Encounter: Payer: Self-pay | Admitting: Physician Assistant

## 2021-10-28 ENCOUNTER — Ambulatory Visit: Payer: BC Managed Care – PPO | Admitting: Physician Assistant

## 2021-10-28 ENCOUNTER — Ambulatory Visit (INDEPENDENT_AMBULATORY_CARE_PROVIDER_SITE_OTHER)
Admission: RE | Admit: 2021-10-28 | Discharge: 2021-10-28 | Disposition: A | Discharge status: Home / Self Care | Payer: BC Managed Care – PPO | Source: Ambulatory Visit | Attending: Physician Assistant | Admitting: Physician Assistant

## 2021-10-28 VITALS — BP 138/84 | HR 81 | Temp 98.0°F | Ht 74.0 in | Wt 300.0 lb

## 2021-10-28 DIAGNOSIS — L918 Other hypertrophic disorders of the skin: Secondary | ICD-10-CM

## 2021-10-28 DIAGNOSIS — J849 Interstitial pulmonary disease, unspecified: Secondary | ICD-10-CM

## 2021-10-28 DIAGNOSIS — E785 Hyperlipidemia, unspecified: Secondary | ICD-10-CM | POA: Diagnosis not present

## 2021-10-28 MED ORDER — MYCOPHENOLATE MOFETIL 500 MG PO TABS: 1000.0000 mg | ORAL_TABLET | Freq: Two times a day (BID) | ORAL | 3 refills | Status: DC

## 2021-10-28 NOTE — Patient Instructions (Signed)
It was great to see you!  Please schedule your bone density scan on the way out of the office today  Keep me posted if you need anything in the meantime  Take care,  Inda Coke PA-C

## 2021-10-30 ENCOUNTER — Emergency Department (HOSPITAL_BASED_OUTPATIENT_CLINIC_OR_DEPARTMENT_OTHER): Payer: BC Managed Care – PPO

## 2021-10-30 ENCOUNTER — Emergency Department (HOSPITAL_BASED_OUTPATIENT_CLINIC_OR_DEPARTMENT_OTHER)
Admission: EM | Admit: 2021-10-30 | Discharge: 2021-10-30 | Disposition: A | Discharge status: Home / Self Care | Payer: BC Managed Care – PPO | Attending: Emergency Medicine | Admitting: Emergency Medicine

## 2021-10-30 ENCOUNTER — Other Ambulatory Visit: Payer: Self-pay

## 2021-10-30 ENCOUNTER — Encounter (HOSPITAL_BASED_OUTPATIENT_CLINIC_OR_DEPARTMENT_OTHER): Payer: Self-pay | Admitting: Emergency Medicine

## 2021-10-30 DIAGNOSIS — R079 Chest pain, unspecified: Secondary | ICD-10-CM | POA: Diagnosis not present

## 2021-10-30 DIAGNOSIS — Z8616 Personal history of COVID-19: Secondary | ICD-10-CM | POA: Diagnosis not present

## 2021-10-30 DIAGNOSIS — Z79899 Other long term (current) drug therapy: Secondary | ICD-10-CM | POA: Diagnosis not present

## 2021-10-30 DIAGNOSIS — Z87891 Personal history of nicotine dependence: Secondary | ICD-10-CM | POA: Diagnosis not present

## 2021-10-30 DIAGNOSIS — E041 Nontoxic single thyroid nodule: Secondary | ICD-10-CM | POA: Diagnosis not present

## 2021-10-30 DIAGNOSIS — Z7901 Long term (current) use of anticoagulants: Secondary | ICD-10-CM | POA: Insufficient documentation

## 2021-10-30 DIAGNOSIS — Z20822 Contact with and (suspected) exposure to covid-19: Secondary | ICD-10-CM | POA: Diagnosis not present

## 2021-10-30 DIAGNOSIS — I129 Hypertensive chronic kidney disease with stage 1 through stage 4 chronic kidney disease, or unspecified chronic kidney disease: Secondary | ICD-10-CM | POA: Diagnosis not present

## 2021-10-30 DIAGNOSIS — N182 Chronic kidney disease, stage 2 (mild): Secondary | ICD-10-CM | POA: Diagnosis not present

## 2021-10-30 DIAGNOSIS — R0602 Shortness of breath: Secondary | ICD-10-CM | POA: Insufficient documentation

## 2021-10-30 DIAGNOSIS — R0789 Other chest pain: Secondary | ICD-10-CM | POA: Diagnosis not present

## 2021-10-30 LAB — CBC WITH DIFFERENTIAL/PLATELET
Abs Immature Granulocytes: 0.03 10*3/uL (ref 0.00–0.07)
Basophils Absolute: 0 10*3/uL (ref 0.0–0.1)
Basophils Relative: 1 %
Eosinophils Absolute: 0.2 10*3/uL (ref 0.0–0.5)
Eosinophils Relative: 3 %
HCT: 46.3 % (ref 39.0–52.0)
Hemoglobin: 14.9 g/dL (ref 13.0–17.0)
Immature Granulocytes: 0 %
Lymphocytes Relative: 20 %
Lymphs Abs: 1.6 10*3/uL (ref 0.7–4.0)
MCH: 27.7 pg (ref 26.0–34.0)
MCHC: 32.2 g/dL (ref 30.0–36.0)
MCV: 86.1 fL (ref 80.0–100.0)
Monocytes Absolute: 0.6 10*3/uL (ref 0.1–1.0)
Monocytes Relative: 8 %
Neutro Abs: 5.3 10*3/uL (ref 1.7–7.7)
Neutrophils Relative %: 68 %
Platelets: 260 10*3/uL (ref 150–400)
RBC: 5.38 MIL/uL (ref 4.22–5.81)
RDW: 14.6 % (ref 11.5–15.5)
WBC: 7.7 10*3/uL (ref 4.0–10.5)
nRBC: 0 % (ref 0.0–0.2)

## 2021-10-30 LAB — PROTIME-INR
INR: 2.2 — ABNORMAL HIGH (ref 0.8–1.2)
Prothrombin Time: 24.2 seconds — ABNORMAL HIGH (ref 11.4–15.2)

## 2021-10-30 LAB — RESP PANEL BY RT-PCR (FLU A&B, COVID) ARPGX2
Influenza A by PCR: NEGATIVE
Influenza B by PCR: NEGATIVE
SARS Coronavirus 2 by RT PCR: NEGATIVE

## 2021-10-30 LAB — BASIC METABOLIC PANEL
Anion gap: 9 (ref 5–15)
BUN: 22 mg/dL — ABNORMAL HIGH (ref 6–20)
CO2: 27 mmol/L (ref 22–32)
Calcium: 10 mg/dL (ref 8.9–10.3)
Chloride: 104 mmol/L (ref 98–111)
Creatinine, Ser: 1.11 mg/dL (ref 0.61–1.24)
GFR, Estimated: >60 mL/min (ref >60)
Glucose, Bld: 119 mg/dL — ABNORMAL HIGH (ref 70–99)
Potassium: 3.4 mmol/L — ABNORMAL LOW (ref 3.5–5.1)
Sodium: 140 mmol/L (ref 135–145)

## 2021-10-30 LAB — TROPONIN I (HIGH SENSITIVITY)
Troponin I (High Sensitivity): 5 ng/L (ref <18)
Troponin I (High Sensitivity): 4 ng/L (ref <18)

## 2021-10-30 MED ORDER — SUCRALFATE 1 GM/10ML PO SUSP
1.0000 g | Freq: Three times a day (TID) | ORAL | Status: DC
Start: 2021-10-30 — End: 2021-10-30
  Administered 2021-10-30: 1 g via ORAL
  Filled 2021-10-30: qty 10

## 2021-10-30 MED ORDER — IOHEXOL 350 MG/ML SOLN
100.0000 mL | Freq: Once | INTRAVENOUS | Status: AC | PRN
  Administered 2021-10-30: 100 mL via INTRAVENOUS

## 2021-10-30 MED ORDER — ALUM & MAG HYDROXIDE-SIMETH 200-200-20 MG/5ML PO SUSP
30.0000 mL | Freq: Once | ORAL | Status: AC
  Administered 2021-10-30: 30 mL via ORAL
  Filled 2021-10-30: qty 30

## 2021-10-30 NOTE — Discharge Instructions (Addendum)
Take 15 mg of coumadin today and then 15 mg coumadin tomorrow as well and follow up for recheck.  Watch for lung symptoms.  Follow-up with your doctors

## 2021-10-30 NOTE — ED Notes (Signed)
States pain center of chest and is better than when came in.  Was radiating but not at present.  Believes GI medications helped.

## 2021-10-30 NOTE — ED Provider Notes (Signed)
  Physical Exam  BP (!) 141/117 (BP Location: Right Arm)   Pulse 89   Temp 97.8 F (36.6 C) (Oral)   Resp 18   Ht 6' (1.829 m)   Wt 136.1 kg   SpO2 98%   BMI 40.69 kg/m   Physical Exam  ED Course/Procedures     Procedures  MDM   Received patient signout.  Chest pain.  Relieved with GI cocktail.  Does have previous esophageal issues.  Also previously pulmonary embolism.  He also interstitial lung disease.  Subtherapeutic on his Coumadin.  Has had medicines adjusted.  CT PE done and showed no clot but did show some potential worsening of his interstitial lung disease.  Can follow-up with his providers for that.  Low risk chest pain.  Troponin negative x2.  Discharge home.      Davonna Belling, MD 10/30/21 [phone]

## 2021-10-30 NOTE — ED Provider Notes (Addendum)
[facility] EMERGENCY DEPT Provider Note   CSN: [id number] Arrival date & time: 10/30/21  8588     History Chief Complaint  Patient presents with   Chest Pain    Wesley Hancock is a 50 y.o. male.  The history is provided by the patient.  Chest Pain Pain location:  L chest Pain quality: pressure   Pain radiates to:  L shoulder and neck Pain severity:  Moderate Onset quality:  Sudden Duration:  4 hours Timing:  Constant Progression:  Unchanged Chronicity:  New Context: at rest   Relieved by:  Nothing Worsened by:  Nothing Ineffective treatments:  None tried Associated symptoms: shortness of breath   Associated symptoms: no diaphoresis, no dizziness, no lower extremity edema, no nausea, no palpitations, no vomiting and no weakness   Risk factors: male sex       Past Medical History:  Diagnosis Date   Acute nonintractable headache 09/06/2019   Acute respiratory failure with hypoxia ([place]) 08/30/2019   AKI (acute kidney injury) ([place]) 08/30/2019   Antiphospholipid syndrome ([place]) 10/30/2019   Anxiety    APS (antiphospholipid syndrome) ([place])    Arthritis    Colon polyp    ? hyperplastic   Depression    Diarrhea 05/21/2020   DVT (deep venous thrombosis) ([name])    Dyspnea on exertion 09/08/2017   "Quit smoking" 2015 with onset of symptoms in 2016  Spirometry 09/08/2017  Flat f/v loop  - d/c acei 09/08/2017  - 01/04/2020   Walked RA x two laps =  approx 513ft @ fast pace - stopped due to end of study/ min sob with sats of 93 % at the end of the study. - PFT's  03/08/20   FEV1 3.84 (83 % ) ratio 0..90  p 3 % improvement from saba p ? 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 06/26/20, 03/08/20   Snoring 07/09/2020   Stress fracture    in back, seeing PT   Stroke ([place])    Upper airway cough syndrome 01/05/2020   Onset mid Jan 2021 while on otc PPI  - max rx for gerd 01/04/2020 >>>      Witnessed apneic spells 07/09/2020    Patient Active Problem List   Diagnosis Date Noted   Vitamin D deficiency 10/09/2021   Compression fracture of T10 vertebra ([place]) 10/09/2021   ILD (interstitial lung disease) ([place]) 05/27/2021   Long term (current) use of anticoagulants 03/26/2021   CVA (cerebral vascular accident) ([place]) 03/20/2021   Mixed connective tissue disease ([place]) 01/21/2021   Hyperglycemia 01/16/2021   Multinodular goiter 01/10/2021  OSA on CPAP 10/11/2020   Esophageal spasm    Colon polyp    Arthritis    APS (antiphospholipid syndrome) ([name])    Anxiety    CKD (chronic kidney disease) stage 2, GFR 60-89 ml/min 07/11/2020   Paresthesia of right foot 11/08/2019   Morbid obesity due to excess calories ([place]) c/b hbp/ dvt/PE 09/10/2019   DVT (deep venous thrombosis) ([place]) 08/30/2019   Multiple pulmonary emboli ([place]) 08/22/2019   Depression 06/24/2017   Dyslipidemia 09/18/2015   Hypertensive heart disease 09/23/2014   Nutcracker esophagus 09/21/2014   Lung nodule 09/21/2014    Past Surgical History:  Procedure Laterality Date   BRONCHIAL WASHINGS  01/14/2021   Procedure: BRONCHIAL WASHINGS;  Surgeon:  Freddi Starr, MD;  Location: [facility];  Service: Pulmonary;;   CHOLECYSTECTOMY     VIDEO BRONCHOSCOPY N/A 01/14/2021   Procedure: VIDEO BRONCHOSCOPY WITHOUT FLUORO;  Surgeon: Freddi Starr, MD;  Location: [facility];  Service: Pulmonary;  Laterality: N/A;       Family History  Problem Relation Age of Onset   Diabetes Mother    Hyperlipidemia Mother    Hypertension Mother    Thyroid disease Mother        uncertain type--had surg--no cancer   Heart disease Father 34   Hyperlipidemia Father    Hypertension Father    Prostate cancer Father 61   Lung cancer Father 12       Dx 06/18/2017   Colon polyps Father    Irritable bowel syndrome Father    Diverticulitis Father    Diabetes Sister    Hyperlipidemia Sister    Hypertension Sister    Diabetes Maternal Grandmother    Heart disease Maternal Grandmother    Hyperlipidemia Maternal Grandmother    Hypertension Maternal Grandmother    Colon cancer Maternal Grandmother    Heart disease Maternal Grandfather    Hyperlipidemia Maternal Grandfather    Hypertension Maternal Grandfather    Stroke Maternal Grandfather    Liver cancer Maternal Grandfather    Irritable bowel syndrome Maternal Grandfather    Heart disease Paternal Grandmother    Healthy Son    Healthy Daughter    Healthy Daughter     Social History   Tobacco Use   Smoking status: Former    Packs/day: 1.00    Years: 20.00    Pack years: 20.00    Types: Cigarettes    Quit date: 11/30/2013    Years since quitting: 7.9   Smokeless tobacco: Former    Types: Snuff    Quit date: 05/21/2018  Vaping Use   Vaping Use: Never used  Substance Use Topics   Alcohol use: Not Currently   Drug use: No    Home Medications Prior to Admission medications   Medication Sig Start Date End Date Taking? Authorizing Provider  acetaminophen (TYLENOL) 500 MG tablet Take 1,000 mg by mouth every 6 (six) hours as needed for mild pain.    [provider]  albuterol  (VENTOLIN HFA) 108 (90 Base) MCG/ACT inhaler Inhale 2 puffs into the lungs every 6 (six) hours as needed for wheezing or shortness of breath. 04/22/21   Freddi Starr, MD  alendronate (FOSAMAX) 70 MG tablet Take 1 tablet (70 mg total) by mouth every 7 (seven) days. Take with a full glass of water on an empty stomach. Patient not taking: Reported on 10/27/2021 10/07/21   Inda Coke, PA  Alirocumab (PRALUENT) 150 MG/ML SOAJ Inject 150 mg into the skin every  14 (fourteen) days. Patient not taking: Reported on 10/07/2021 09/18/21   Garvin Fila, MD  atorvastatin (LIPITOR) 40 MG tablet Take 1 tablet (40 mg total) by mouth daily. 10/07/21   Inda Coke, PA  buPROPion (WELLBUTRIN XL) 150 MG 24 hr tablet Take 1 tablet (150 mg total) by mouth daily. Take 1 tablet daily 04/25/21   Vivi Barrack, MD  escitalopram (LEXAPRO) 20 MG tablet Take 1 tablet (20 mg total) by mouth daily. 04/25/21   Vivi Barrack, MD  fluticasone [facility]) 50 MCG/ACT nasal spray Place 1 spray into both nostrils daily as needed for allergies.    [provider]  Multiple Vitamin (MULTIVITAMIN) tablet Take by mouth daily. 2 chews daily    [provider]  mycophenolate (CELLCEPT) 500 MG tablet Take 2 tablets (1,000 mg total) by mouth 2 (two) times daily. 10/28/21   Inda Coke, PA  pantoprazole (PROTONIX) 40 MG tablet Take 1 tablet (40 mg total) by mouth daily. 03/25/21   Freddi Starr, MD  telmisartan-hydrochlorothiazide (MICARDIS HCT) 80-25 MG tablet TAKE 1 TABLET BY MOUTH EVERY DAY 09/15/21   Inda Coke, PA  warfarin (COUMADIN) 5 MG tablet Take 2 tablets daily except take 3 tablets on Mon and Thurs or Take as directed by anticoagulation clinic 06/26/21   Inda Coke, PA    Allergies    Diltiazem hcl  Review of Systems   Review of Systems  Constitutional:  Negative for diaphoresis.  HENT:  Negative for facial swelling.   Eyes:  Negative for redness.  Respiratory:  Positive for  shortness of breath.   Cardiovascular:  Positive for chest pain. Negative for palpitations.  Gastrointestinal:  Negative for diarrhea, nausea and vomiting.  Genitourinary:  Negative for dysuria.  Musculoskeletal:  Negative for neck stiffness.  Skin:  Negative for rash.  Neurological:  Negative for dizziness and weakness.  Psychiatric/Behavioral:  Negative for agitation.   All other systems reviewed and are negative.  Physical Exam Updated Vital Signs BP (!) 142/94 (BP Location: Right Arm)   Pulse 100   Temp 97.8 F (36.6 C) (Oral)   Resp 20   Ht 6' (1.829 m)   Wt 136.1 kg   SpO2 100%   BMI 40.69 kg/m   Physical Exam Vitals and nursing note reviewed. Exam conducted with a chaperone present.  Constitutional:      Appearance: Normal appearance. He is not diaphoretic.  HENT:     Head: Normocephalic and atraumatic.     Nose: Nose normal.  Eyes:     Conjunctiva/sclera: Conjunctivae normal.     Pupils: Pupils are equal, round, and reactive to light.  Cardiovascular:     Rate and Rhythm: Normal rate and regular rhythm.     Pulses: Normal pulses.     Heart sounds: Normal heart sounds.  Pulmonary:     Effort: Pulmonary effort is normal.     Breath sounds: Normal breath sounds.  Abdominal:     General: Bowel sounds are normal.     Palpations: Abdomen is soft.     Tenderness: There is no abdominal tenderness. There is no guarding.  Musculoskeletal:        General: Normal range of motion.     Cervical back: Normal range of motion and neck supple.  Skin:    General: Skin is warm and dry.     Capillary Refill: Capillary refill takes less than 2 seconds.  Neurological:     General: No focal deficit present.  Mental Status: He is alert and oriented to person, place, and time.     Deep Tendon Reflexes: Reflexes normal.  Psychiatric:        Mood and Affect: Mood normal.        Behavior: Behavior normal.    ED Results / Procedures / Treatments   Labs (all labs ordered are  listed, but only abnormal results are displayed) Results for orders placed or performed during the hospital encounter of 10/30/21  CBC with Differential/Platelet  Result Value Ref Range   WBC 7.7 4.0 - 10.5 K/uL   RBC 5.38 4.22 - 5.81 MIL/uL   Hemoglobin 14.9 13.0 - 17.0 g/dL   HCT 46.3 39.0 - 52.0 %   MCV 86.1 80.0 - 100.0 fL   MCH 27.7 26.0 - 34.0 pg   MCHC 32.2 30.0 - 36.0 g/dL   RDW 14.6 11.5 - 15.5 %   Platelets 260 150 - 400 K/uL   nRBC 0.0 0.0 - 0.2 %   Neutrophils Relative % 68 %   Neutro Abs 5.3 1.7 - 7.7 K/uL   Lymphocytes Relative 20 %   Lymphs Abs 1.6 0.7 - 4.0 K/uL   Monocytes Relative 8 %   Monocytes Absolute 0.6 0.1 - 1.0 K/uL   Eosinophils Relative 3 %   Eosinophils Absolute 0.2 0.0 - 0.5 K/uL   Basophils Relative 1 %   Basophils Absolute 0.0 0.0 - 0.1 K/uL   Immature Granulocytes 0 %   Abs Immature Granulocytes 0.03 0.00 - 0.07 K/uL  Protime-INR  Result Value Ref Range   Prothrombin Time 24.2 (H) 11.4 - 15.2 seconds   INR 2.2 (H) 0.8 - 1.2   DG Chest Portable 1 View  Result Date: 10/30/2021 CLINICAL DATA:  Chest pain EXAM: PORTABLE CHEST 1 VIEW COMPARISON:  01/13/2021 FINDINGS: The lungs are clear without focal pneumonia, edema, pneumothorax or pleural effusion. The cardiopericardial silhouette is within normal limits for size. The visualized bony structures of the thorax show no acute abnormality. Telemetry leads overlie the chest. IMPRESSION: No active disease. Electronically Signed   By: Misty Stanley M.D.   On: 10/30/2021 06:37    EKG  Date: 10/30/2021  Rate: 94  Rhythm: normal sinus rhythm  QRS Axis: normal  Intervals: normal  ST/T Wave abnormalities: normal  Conduction Disutrbances: none  Narrative Interpretation: unremarkable    Radiology No results found.  Procedures Procedures   Medications Ordered in ED Medications  alum & mag hydroxide-simeth (MAALOX/MYLANTA) 200-200-20 MG/5ML suspension 30 mL (has no administration in time range)   sucralfate (CARAFATE) 1 GM/10ML suspension 1 g (has no administration in time range)    ED Course  I have reviewed the triage vital signs and the nursing notes.  Pertinent labs & imaging results that were available during my care of the patient were reviewed by me and considered in my medical decision making (see chart for details).  HEART score at baseline      Final Clinical Impression(s) / ED Diagnoses Final diagnoses:  None   Signed out to Dr. Alvino Chapel pending labs and imaging      Mckenzie Bove, MD 10/30/21 [phone]

## 2021-10-30 NOTE — ED Triage Notes (Signed)
Woke up with chest pain around 2 am. States pain radiates to left shoulder and neck.

## 2021-10-31 DIAGNOSIS — E785 Hyperlipidemia, unspecified: Secondary | ICD-10-CM | POA: Diagnosis not present

## 2021-11-05 ENCOUNTER — Telehealth: Payer: Self-pay | Admitting: Family Medicine

## 2021-11-05 NOTE — Progress Notes (Signed)
I, Wesley Hancock, LAT, ATC, am serving as scribe for Dr. Lynne Hancock.  Wesley Hancock is a 50 y.o. male who presents to [place] at [facility] today for f/u of thoracic pain and T-spine MRI review.  He was last seen by Dr. Georgina Hancock on 10/09/21 w/ c/o mid-low back pain that is associated w a T10 compression fx discovered during a chest CT on 08/21/21 thought to be associated w/ prolonged steroid use.  He was referred for a T-spine MRI.  Today, pt reports that his mid-low back pain remains the same.  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  He will be in between health insurances for a little over a month very soon.  Diagnostic testing: T-spine MRI at [facility] - ; chest CT- 08/21/21; L-spine XR- 07/24/21  Pertinent review of systems: No fevers or chills  Relevant historical information: Pulmonary embolism   Exam:  BP 120/70 (BP Location: Right Arm, Patient Position: Sitting, Cuff Size: Large)   Pulse 82   Ht 6' (1.829 m)   Wt (!) 302 lb 6.4 oz (137.2 kg)   SpO2 95%   BMI 41.01 kg/m  General: Well Developed, well nourished, and in no acute distress.   MSK: T-spine not particularly point tender at midline.  Diffusely mildly tender around the T10 region.  Mildly tender to motion thoracic spine.    Lab and Radiology Results   EXAM: CT ANGIOGRAPHY CHEST WITH CONTRAST   TECHNIQUE: Multidetector CT imaging of the chest was performed using the standard protocol during bolus administration of intravenous contrast. Multiplanar CT image reconstructions and MIPs were obtained to evaluate the vascular anatomy.   CONTRAST:  122mL OMNIPAQUE IOHEXOL 350 MG/ML SOLN   COMPARISON:  Same day chest radiograph, CT chest 08/21/2021   FINDINGS: Cardiovascular: There is adequate opacification of the pulmonary arteries to the segmental level. There is no  evidence of pulmonary embolism. The heart is not enlarged. Coronary artery calcifications are again seen. The thoracic aorta is not aneurysmal.   Mediastinum/Nodes: There is a 1.6 cm right thyroid nodule, unchanged going back to at least 2020 and previously evaluated by ultrasound. There are multiple prominent mediastinal lymph nodes measuring up to 9 mm in short axis, overall not significantly changed going back to 2020. There are also prominent bilateral hilar lymph nodes measuring up to 1.3 cm on the right, overall not significantly changed.   Lungs/Pleura: The trachea and central airways are clear.   There are patchy nodular and ground-glass opacities throughout both lungs, similar in appearance to the study from 08/21/2021, but overall worsened in extent. There is no pleural effusion or pneumothorax.   Upper Abdomen: The liver is diffusely hypoattenuating consistent with fatty infiltration. There are no acute findings in the imaged upper abdomen.   Musculoskeletal: There is unchanged compression deformity of the T10 vertebral body. There is no acute osseous abnormality or aggressive osseous lesion.   Review of the MIP images confirms the above findings.   IMPRESSION: 1. No evidence of pulmonary embolism to the segmental level. 2. Patchy ground-glass and nodular opacities throughout both lungs are similar in appearance to the prior study but overall worsened in extent. These may reflect multifocal infection/pneumonia; however, alternative etiology such as cryptogenic organizing pneumonia are also possible. 3. Prominent mediastinal and hilar lymph nodes are overall not significantly changed compared to prior studies,  nonspecific and possibly reactive. 4. Coronary artery calcifications.     Electronically Signed   By: Valetta Mole M.D.   On: 10/30/2021 08:35 I, Wesley Hancock, personally (independently) visualized and performed the interpretation of the images attached in  this note.    Procedure Note  Wesley Box, MD - 10/31/2021  Formatting of this note might be different from the original.  EXAMINATION: MRI thoracic spine without contrast   CLINICAL INDICATION: Compression fracture   TECHNIQUE: MRI thoracic spine protocol without contrast   COMPARISON: None   FINDINGS:   There is a Schmorl's node in the superior endplate of Y65. No compression fracture identified.   Alignment: Normal   Bone marrow: No significant abnormality   Thoracic spinal cord: Normal   Intervertebral discs: There is no disc herniation. There is a mild disc bulge at T11-T12   Paraspinal tissues: Normal    IMPRESSION:   There is a Schmorl's node in the superior endplate of L93.   There is no compression fracture identified.   No other significant abnormality   Electronically Signed by: Michael Boston on 10/31/2021 10:59 AM  Assessment and Plan: 50 y.o. male with pain of the thoracic spine.  Its not clear if his pain is because of a compression fracture or the Schmorl's node seen on MRI.  CT scan seen indicate compression fracture and MRI seems to indicate Schmorl's node.  It could be a little bit of both.  He has had trial of physical therapy without much benefit so it may be worth his time to have a consultation with interventional radiology since he does have some pain.  However he would like to wait and see especially once he starts his new job as his job duties will change and he thinks he is going to have less pain.  Recheck PRN.    Discussed warning signs or symptoms. Please see discharge instructions. Patient expresses understanding.   The above documentation has been reviewed and is accurate and complete Wesley Hancock, M.D.   Total encounter time 20 minutes including face-to-face time with the patient and, reviewing past medical record, and charting on the date of service.   MRI and CT review and plan

## 2021-11-05 NOTE — Telephone Encounter (Signed)
Called pt and left DM on VM w/ advice to schedule a f/u appt w/ Dr. [name] to review T-spine MRI results from [facility].

## 2021-11-05 NOTE — Telephone Encounter (Signed)
Pt called, MRI f/u scheduled for 11/06/2021

## 2021-11-05 NOTE — Telephone Encounter (Signed)
MRI thoracic spine from [facility] does not show compression fracture therefore kyphoplasty or vertebroplasty is not indicated. Recommend return to clinic to go over the results in full detail and discuss future treatment plan and options.

## 2021-11-06 ENCOUNTER — Encounter: Payer: Self-pay | Admitting: Family Medicine

## 2021-11-06 ENCOUNTER — Ambulatory Visit (INDEPENDENT_AMBULATORY_CARE_PROVIDER_SITE_OTHER): Payer: BC Managed Care – PPO | Admitting: Family Medicine

## 2021-11-06 ENCOUNTER — Other Ambulatory Visit: Payer: Self-pay

## 2021-11-06 VITALS — BP 120/70 | HR 82 | Ht 72.0 in | Wt 302.4 lb

## 2021-11-06 DIAGNOSIS — S22070A Wedge compression fracture of T9-T10 vertebra, initial encounter for closed fracture: Secondary | ICD-10-CM

## 2021-11-06 NOTE — Patient Instructions (Addendum)
Good to see you today.  Happy Holidays.  Follow-up: as needed.   Let me know if and when you want me refer to interventional radiology.   Heating pad can help.   Get the CD of the MRI made. Its precious. I dont need it right now but future people will.

## 2021-11-26 ENCOUNTER — Ambulatory Visit: Payer: BC Managed Care – PPO

## 2021-12-10 ENCOUNTER — Other Ambulatory Visit: Payer: Self-pay

## 2021-12-10 ENCOUNTER — Ambulatory Visit (INDEPENDENT_AMBULATORY_CARE_PROVIDER_SITE_OTHER): Payer: BC Managed Care – PPO

## 2021-12-10 DIAGNOSIS — Z7901 Long term (current) use of anticoagulants: Secondary | ICD-10-CM | POA: Diagnosis not present

## 2021-12-10 LAB — POCT INR: INR: 3.7 — AB (ref 2.0–3.0)

## 2021-12-10 MED ORDER — WARFARIN SODIUM 5 MG PO TABS: ORAL_TABLET | ORAL | 1 refills | Status: DC

## 2021-12-10 NOTE — Progress Notes (Addendum)
Take 1 tablet today and then continue to take 2 tablets every day except 3 tablets on Monday and Thursdays.  Re-check in 5 weeks.

## 2021-12-10 NOTE — Patient Instructions (Addendum)
Pre visit review using our clinic review tool, if applicable. No additional management support is needed unless otherwise documented below in the visit note.  Take 1 tablet today and then continue to take 2 tablets every day except 3 tablets on Monday and Thursdays.  Re-check in 5 weeks.

## 2022-01-06 ENCOUNTER — Encounter: Payer: Self-pay | Admitting: Physician Assistant

## 2022-01-07 MED ORDER — TELMISARTAN-HCTZ 80-25 MG PO TABS: 1.0000 | ORAL_TABLET | Freq: Every day | ORAL | 1 refills | Status: DC

## 2022-01-07 MED ORDER — ATORVASTATIN CALCIUM 40 MG PO TABS: 40.0000 mg | ORAL_TABLET | Freq: Every day | ORAL | 1 refills | Status: DC

## 2022-01-13 ENCOUNTER — Telehealth: Payer: Self-pay | Admitting: Physician Assistant

## 2022-01-13 NOTE — Telephone Encounter (Signed)
Pt asked if Wesley Hancock can give him a call on Thursday, 01/15/22

## 2022-01-14 ENCOUNTER — Ambulatory Visit: Payer: BC Managed Care – PPO

## 2022-01-19 NOTE — Telephone Encounter (Signed)
LVM on 2/16  LVM today.

## 2022-01-21 ENCOUNTER — Other Ambulatory Visit: Payer: Self-pay | Admitting: Family Medicine

## 2022-01-27 NOTE — Telephone Encounter (Signed)
Pt has been out of town with a death in the family. He has been RS for tomorrow at [facility].

## 2022-01-27 NOTE — Telephone Encounter (Signed)
LVM to call to be RS.

## 2022-01-28 ENCOUNTER — Other Ambulatory Visit: Payer: Self-pay

## 2022-01-28 ENCOUNTER — Ambulatory Visit (INDEPENDENT_AMBULATORY_CARE_PROVIDER_SITE_OTHER): Payer: BC Managed Care – PPO

## 2022-01-28 DIAGNOSIS — Z7901 Long term (current) use of anticoagulants: Secondary | ICD-10-CM

## 2022-01-28 LAB — POCT INR: INR: 2.4 (ref 2.0–3.0)

## 2022-01-28 NOTE — Patient Instructions (Addendum)
Pre visit review using our clinic review tool, if applicable. No additional management support is needed unless otherwise documented below in the visit note. ? ?Increase dose today to take 3 tablets and then continue to take 2 tablets every day except 3 tablets on Monday and Thursdays.  Re-check in 3 weeks.  ?

## 2022-01-28 NOTE — Progress Notes (Signed)
Increase dose today to take 3 tablets and then continue to take 2 tablets every day except 3 tablets on Monday and Thursdays.  Re-check in 3 weeks.  ?

## 2022-02-05 ENCOUNTER — Other Ambulatory Visit: Payer: Self-pay | Admitting: *Deleted

## 2022-02-05 ENCOUNTER — Telehealth: Payer: Self-pay | Admitting: Physician Assistant

## 2022-02-05 DIAGNOSIS — J849 Interstitial pulmonary disease, unspecified: Secondary | ICD-10-CM

## 2022-02-05 DIAGNOSIS — K219 Gastro-esophageal reflux disease without esophagitis: Secondary | ICD-10-CM

## 2022-02-05 MED ORDER — PANTOPRAZOLE SODIUM 40 MG PO TBEC: 40.0000 mg | DELAYED_RELEASE_TABLET | Freq: Every day | ORAL | 3 refills | Status: DC

## 2022-02-05 MED ORDER — ESCITALOPRAM OXALATE 20 MG PO TABS: 20.0000 mg | ORAL_TABLET | Freq: Every day | ORAL | 1 refills | Status: DC

## 2022-02-05 MED ORDER — MYCOPHENOLATE MOFETIL 500 MG PO TABS: 1000.0000 mg | ORAL_TABLET | Freq: Two times a day (BID) | ORAL | 3 refills | Status: DC

## 2022-02-05 MED ORDER — ATORVASTATIN CALCIUM 40 MG PO TABS: 40.0000 mg | ORAL_TABLET | Freq: Every day | ORAL | 1 refills | Status: DC

## 2022-02-05 NOTE — Telephone Encounter (Signed)
Rx send to pharmacy  ?[place]  ?

## 2022-02-05 NOTE — Telephone Encounter (Signed)
. ?  Encourage patient to contact the pharmacy for refills or they can request refills through [facility] ? ?LAST APPOINTMENT DATE:  10/28/21 ? ?NEXT APPOINTMENT DATE: N/A ? ?MEDICATION:atorvastatin (LIPITOR) 40 MG tablet ? ?buPROPion (WELLBUTRIN XL) 150 MG 24 hr tablet ? ?escitalopram (LEXAPRO) 20 MG tablet ? ?mycophenolate (CELLCEPT) 500 MG tablet ? ?pantoprazole (PROTONIX) 40 MG tablet ? ?telmisartan-hydrochlorothiazide (MICARDIS HCT) 80-25 MG tablet ? ?Is the patient out of medication? Only 2-3 days left ? ?PHARMACY: ?[place], [place] - [address] Phone:  [phone]  ?Fax:  [phone]  ?  ? ? ?Let patient know to contact pharmacy at the end of the day to make sure medication is ready. ? ?Please notify patient to allow 48-72 hours to process  ?

## 2022-02-08 ENCOUNTER — Encounter (HOSPITAL_COMMUNITY): Payer: Self-pay

## 2022-02-08 ENCOUNTER — Emergency Department (HOSPITAL_COMMUNITY)
Admission: EM | Admit: 2022-02-08 | Discharge: 2022-02-08 | Disposition: A | Discharge status: Home / Self Care | Payer: Worker's Compensation | Attending: Emergency Medicine | Admitting: Emergency Medicine

## 2022-02-08 ENCOUNTER — Emergency Department (HOSPITAL_COMMUNITY): Payer: Worker's Compensation

## 2022-02-08 ENCOUNTER — Other Ambulatory Visit: Payer: Self-pay

## 2022-02-08 DIAGNOSIS — Z7901 Long term (current) use of anticoagulants: Secondary | ICD-10-CM | POA: Diagnosis not present

## 2022-02-08 DIAGNOSIS — R0789 Other chest pain: Secondary | ICD-10-CM | POA: Diagnosis not present

## 2022-02-08 DIAGNOSIS — R072 Precordial pain: Secondary | ICD-10-CM | POA: Insufficient documentation

## 2022-02-08 MED ORDER — OXYCODONE-ACETAMINOPHEN 5-325 MG PO TABS: 1.0000 | ORAL_TABLET | Freq: Four times a day (QID) | ORAL | 0 refills | Status: DC | PRN

## 2022-02-08 MED ORDER — METHOCARBAMOL 500 MG PO TABS: 500.0000 mg | ORAL_TABLET | Freq: Two times a day (BID) | ORAL | 0 refills | Status: DC

## 2022-02-08 NOTE — ED Provider Notes (Signed)
Wesley Hancock DEPT Provider Note   CSN: [id number] Arrival date & time: 02/08/22  0935     History  Chief Complaint  Patient presents with   Chest Pain    Wesley Hancock is a 51 y.o. male.   Chest Pain  Patient is a 51 year old male with numerous medical problems history of numerous VTE episodes requiring Coumadin use, patient found to have antiphospholipid syndrome, lupus, interstitial lung disease  Patient states that he regularly gets his INR checked and is a has not had any major fluctuations in his INR.  Patient is presented emergency room today with complaints of sternal chest pain that he states that has been constant since 730 this morning when he had a gasoline hose connected to a truck and was told the      Home Medications Prior to Admission medications   Medication Sig Start Date End Date Taking? Authorizing Provider  methocarbamol (ROBAXIN) 500 MG tablet Take 1 tablet (500 mg total) by mouth 2 (two) times daily. 02/08/22  Yes Fahad Cisse, Ova [place] S, PA  acetaminophen (TYLENOL) 500 MG tablet Take 1,000 mg by mouth every 6 (six) hours as needed for mild pain.    [provider]  albuterol (VENTOLIN HFA) 108 (90 Base) MCG/ACT inhaler Inhale 2 puffs into the lungs every 6 (six) hours as needed for wheezing or shortness of breath. 04/22/21   Freddi Starr, MD  alendronate (FOSAMAX) 70 MG tablet Take 1 tablet (70 mg total) by mouth every 7 (seven) days. Take with a full glass of water on an empty stomach. Patient not taking: Reported on 10/27/2021 10/07/21   Inda Coke, PA  Alirocumab (PRALUENT) 150 MG/ML SOAJ Inject 150 mg into the skin every 14 (fourteen) days. Patient not taking: Reported on 10/07/2021 09/18/21   Garvin Fila, MD  atorvastatin (LIPITOR) 40 MG tablet Take 1 tablet (40 mg total) by mouth daily. 02/05/22   Vivi Barrack, MD  buPROPion (WELLBUTRIN XL) 150 MG 24 hr tablet TAKE 1 TABLET (150 MG TOTAL) BY MOUTH  DAILY. 01/21/22   Vivi Barrack, MD  escitalopram (LEXAPRO) 20 MG tablet Take 1 tablet (20 mg total) by mouth daily. 02/05/22   Vivi Barrack, MD  fluticasone [facility]) 50 MCG/ACT nasal spray Place 1 spray into both nostrils daily as needed for allergies.    [provider]  Multiple Vitamin (MULTIVITAMIN) tablet Take by mouth daily. 2 chews daily    [provider]  mycophenolate (CELLCEPT) 500 MG tablet Take 2 tablets (1,000 mg total) by mouth 2 (two) times daily. 02/05/22   Vivi Barrack, MD  oxyCODONE-acetaminophen (PERCOCET/ROXICET) 5-325 MG tablet Take 1 tablet by mouth every 6 (six) hours as needed for severe pain. 02/08/22  Yes Katalin Colledge S, PA  pantoprazole (PROTONIX) 40 MG tablet Take 1 tablet (40 mg total) by mouth daily. 02/05/22   Vivi Barrack, MD  telmisartan-hydrochlorothiazide (MICARDIS HCT) 80-25 MG tablet Take 1 tablet by mouth daily. 01/07/22   Inda Coke, PA  warfarin (COUMADIN) 5 MG tablet TAKE 2 TABLETS DAILY BY MOUTH EXCEPT TAKE 3 TABLETS ON MONDAYS AND THURSDAYS OR AS DIRECTED BY ANTICOAGULATION CLINIC. 12/10/21   Inda Coke, PA      Allergies    Diltiazem hcl    Review of Systems   Review of Systems  Cardiovascular:  Positive for chest pain.   Physical Exam Updated Vital Signs BP 114/78    Pulse 100    Temp 98 F (36.7  C) (Oral)    Resp 15    Ht '6\' 2"'$  (1.88 m)    Wt 136.1 kg    SpO2 97%    BMI 38.52 kg/m  Physical Exam Vitals and nursing note reviewed.  Constitutional:      General: He is not in acute distress.    Comments: Pleasant well-appearing 51 year old.  In no acute distress.  Sitting comfortably in bed.  Able answer questions appropriately follow commands. No increased work of breathing. Speaking in full sentences.   HENT:     Head: Normocephalic and atraumatic.     Nose: Nose normal.  Eyes:     General: No scleral icterus. Cardiovascular:     Rate and Rhythm: Normal rate and regular rhythm.     Pulses: Normal pulses.      Heart sounds: Normal heart sounds.  Pulmonary:     Effort: Pulmonary effort is normal. No respiratory distress.     Breath sounds: Normal breath sounds. No wheezing.  Chest:     Chest wall: Tenderness present.  Abdominal:     Palpations: Abdomen is soft.     Tenderness: There is no abdominal tenderness.  Musculoskeletal:     Cervical back: Normal range of motion.     Right lower leg: No edema.     Left lower leg: No edema.  Skin:    General: Skin is warm and dry.     Capillary Refill: Capillary refill takes less than 2 seconds.  Neurological:     Mental Status: He is alert. Mental status is at baseline.  Psychiatric:        Mood and Affect: Mood normal.        Behavior: Behavior normal.    ED Results / Procedures / Treatments   Labs (all labs ordered are listed, but only abnormal results are displayed) Labs Reviewed - No data to display  EKG None  Radiology DG Chest 2 View  Result Date: 02/08/2022 CLINICAL DATA:  Chest pain EXAM: CHEST - 2 VIEW COMPARISON:  10/30/2021 FINDINGS: Artifact from EKG leads. Normal heart size and mediastinal contours. No acute infiltrate or edema. No effusion or pneumothorax. No acute osseous findings. IMPRESSION: No active cardiopulmonary disease. Electronically Signed   By: Jorje Guild M.D.   On: 02/08/2022 10:21    Procedures Procedures    Medications Ordered in ED Medications - No data to display  ED Course/ Medical Decision Making/ A&P                           Medical Decision Making Amount and/or Complexity of Data Reviewed Radiology: ordered.  Risk Prescription drug management.   This patient presents to the ED for concern of chest pain, this involves a number of treatment options, and is a complaint that carries with it a high risk of complications and morbidity.  The differential diagnosis includes in the context of trauma--dissection, sternal fracture, muscle injury, contusion, bruising.   Co  morbidities: Discussed in HPI   Brief History:  Patient with trauma to his chest approximately 730 this morning.  There was enough force to push him backwards 1 step.  He denies any difficulty breathing.  Physical exam is quite reassuring he does have some chest wall tenderness over the sternum I do not appreciate any bruising.   EMR reviewed including pt PMHx, past surgical history and past visits to ER.   See HPI for more details   Lab Tests:  Imaging Studies:  NAD. I personally reviewed all imaging studies and no acute abnormality found. I agree with radiology interpretation.  I independently reviewed chest x-ray specifically looked at the lateral chest x-ray viewed to look for any fracture along the sternal body I do not appreciate any.  Agree with radiology read no acute disease.  Cardiac Monitoring:  NA NA   Medicines ordered:  Offered analgesia which he declines   Critical Interventions:     Consults/Attending Physician        Reevaluation:  After the interventions noted above I re-evaluated patient and found that they have :stayed the same   Social Determinants of Health:  The patient's social determinants of health were a factor in the care of this patient    Problem List / ED Course:  Traumatic chest pain Chest wall pain  Had a lengthy discussion with the patient about the fact that he is on Coumadin and this puts him at risk for intrathoracic hemorrhage however patient has normal vital signs states he has no symptoms other than some sternal pain from the area of impact and states that it is not pleuritic and he does not feel short of breath.  I asked him this question several times given that the triage note does state that he is short of breath however he denies any dyspnea.  He denies any hemoptysis any numbness or weakness of upper or lower extremities no abdominal pain lightheadedness or dizziness no other associated symptoms.  I  had a lengthy discussion about return precautions with patient.  He is agreeable to plan.  Will discharge home at this time as he requests.  Dispostion:  After consideration of the diagnostic results and the patients response to treatment, I feel that the patent would benefit from close follow-up and immediate return to the ER for new or concerning symptoms.   Final Clinical Impression(s) / ED Diagnoses Final diagnoses:  Chest wall pain    Rx / DC Orders ED Discharge Orders          Ordered    oxyCODONE-acetaminophen (PERCOCET/ROXICET) 5-325 MG tablet  Every 6 hours PRN        02/08/22 1138    methocarbamol (ROBAXIN) 500 MG tablet  2 times daily        02/08/22 1139              Pati Gallo [place], [place] 02/08/22 [place], Ankit, MD 02/09/22 1047

## 2022-02-08 NOTE — Discharge Instructions (Addendum)
Your chest x-ray was without any evidence of sternal fracture or any abnormality of the lungs. ? ?As we discussed it is very important to keep a close eye on your symptoms and return to the ER should your symptoms significantly change or if you have any difficulty breathing lightheadedness dizziness or any other new or concerning symptoms. ? ?Please take Tylenol for pain without milligrams every 6 hours.  Use muscle relaxer as needed as prescribed.  If you have severe/breakthrough pain--as we discussed you should have a low threshold for return--you may use Percocet I have given you 4 tablets of this.  If you do take Percocet please use half of the dose of Tylenol--500 mg--for 1 dose.  Please follow-up with your primary care doctor and make sure to have your INR checked within the next couple days. ?

## 2022-02-08 NOTE — ED Triage Notes (Signed)
Pt reports chest pain and shortness of breath after being struck in the chest with a piece of metal at work.  ?

## 2022-02-11 ENCOUNTER — Ambulatory Visit: Payer: BC Managed Care – PPO | Admitting: Pulmonary Disease

## 2022-02-11 ENCOUNTER — Telehealth: Payer: Self-pay | Admitting: Pulmonary Disease

## 2022-02-11 NOTE — Telephone Encounter (Signed)
Patient was scheduled for a follow up today with JD but called and cancelled his appt. He did not reschedule the appt. I called him to see if I could get him rescheduled. He did not answer.  ? ?Left message for him to call us back.  ?

## 2022-02-11 NOTE — Progress Notes (Deleted)
? ?Synopsis: Pulmonary follow up for ILD diagnosis in 12/2020 ? ?Subjective:  ? ?PATIENT ID: Wesley Hancock GENDER: male DOB: 1971/06/23, MRN: [id number] ? ?HPI ? ?No chief complaint on file. ? ?Wesley Hancock is a 51 year old male, former smoker with history of DVT/PE due to antiphospholipid syndrome diagnosed in September 2020 and later with SLE and mixed connective tissue disease with interstitial lung disease features diagnosed in February 2022.  ? ?He continues on 1g cellcept twice daily. Hydroxychloroquine was discontinued by his cardiology team. He was seen by rheumatology 10/27/21, note reviewed, with no changes to his regimen.  ? ?He presented to the ED 10/30/21 and 02/08/22 for chest pains. CTA PE chest 10/30/21 which showed increased presence of patchy ground glass infiltrates, possibly progressed from previous scans.  ? ?OV 05/27/21 ?He continues on 1g cellcept twice daily along with hydroxychloroquine for the mixed connective tissue disease and lupus. He has tapered down to '10mg'$  of prednisone daily without any increase in dyspnea. He has noticed an increase in intermittent back pain though. He had an episode of acute dyspnea over the last weekend with SpO2 into the low 90s. He rested for a couple days and then the episode resolved spontaneously. He denies any cough or wheezing. No skin rashes. ? ? ?OV 04/22/21 ?He was started on steroid taper in February, initially on '60mg'$  daily with bactrim prophylaxis. He was tapered to '40mg'$  daily on 01/30/21 and started on mycophenolate '500mg'$  twice daily at that time. The mycophenolate was increased to 1g twice daily on 03/26/21. He has tolerated the mycophenolate well and his lab values remain in normal range. He is currently on '30mg'$  of prednisone daily and bactrim prophylaxis. He reports noticing some increased shortness of breath over the past 3-4 days. Denies fevers or chills.  ? ?He received his CPAP machine at the beginning of the month and has noticed  improvement in his day time fatigue. He has been gaining weight since being on the steroid taper. ? ?He is followed by Dr. [name] in rheumatology and [name], NP in hematology. I have reviewed there notes from 5/19 and 5/20 respectively. ? ?Past Medical History:  ?Diagnosis Date  ? Acute nonintractable headache 09/06/2019  ? Acute respiratory failure with hypoxia ([place]) 08/30/2019  ? AKI (acute kidney injury) ([place]) 08/30/2019  ? Antiphospholipid syndrome ([place]) 10/30/2019  ? Anxiety   ? APS (antiphospholipid syndrome) ([place])   ? Arthritis   ? Colon polyp   ? ? hyperplastic  ? Depression   ? Diarrhea 05/21/2020  ? DVT (deep venous thrombosis) ([place])   ? Dyspnea on exertion 09/08/2017  ? "Quit smoking" 2015 with onset of symptoms in 2016  Spirometry 09/08/2017  Flat f/v loop  - d/c acei 09/08/2017  - 01/04/2020   Walked RA x two laps =  approx 567f @ fast pace - stopped due to end of study/ min sob with sats of 93 % at the end of the study. - PFT's  03/08/20   FEV1 3.84 (83 % ) ratio 0..90  p 3 % improvement from saba p ? prior to study with DLCO  26.46 (77%) corrects to 4.76 (99%)    ? Esophageal spasm   ? Essential hypertension 09/23/2014  ? D/c acei 09/08/2017 due to pseudocopd   ? GAD (generalized anxiety disorder) 08/06/2016  ? Lobar pneumonia ([facility] [place] 08/30/2019  ? Lung nodule 09/21/2014  ? Lupus ([place]   ? Mixed hyperlipidemia 09/18/2015  ? Morbid obesity due to excess calories ([facility]  c/b hbp/ dvt/PE 09/10/2019  ? Multiple pulmonary emboli ([place]) 08/22/2019  ? 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 Nutcracker esophagus   ? Paresthesia of right foot 11/08/2019  ? Pulmonary infiltrates 09/10/2019  ? In setting of bilateral PE 08/23/2019 with antiphospholipid syndrome   ? Recurrent pneumonia 07/09/2020  ? Formatting of this note might be different from the original.  06/26/20, 03/08/20  ? Snoring 07/09/2020  ? Stress fracture   ? in back, seeing PT  ? Stroke [facility])   ? Upper airway cough syndrome 01/05/2020  ? Onset mid Jan 2021 while on otc PPI  - max rx for gerd 01/04/2020 >>>     ? Witnessed apneic spells 07/09/2020  ?  ? ?Family History  ?Problem Relation Age of Onset  ? Diabetes Mother   ? Hyperlipidemia Mother   ? Hypertension Mother   ? Thyroid disease Mother   ?     uncertain type--had surg--no cancer  ? Heart disease Father 48  ? Hyperlipidemia Father   ? Hypertension Father   ? Prostate cancer Father 81  ? Lung cancer Father 84  ?     Dx 06/18/2017  ? Colon polyps Father   ? Irritable bowel syndrome Father   ? Diverticulitis Father   ? Diabetes Sister   ? Hyperlipidemia Sister   ? Hypertension Sister   ? Diabetes Maternal Grandmother   ? Heart disease Maternal Grandmother   ? Hyperlipidemia Maternal Grandmother   ? Hypertension Maternal Grandmother   ? Colon cancer Maternal Grandmother   ? Heart disease Maternal Grandfather   ? Hyperlipidemia Maternal Grandfather   ? Hypertension Maternal Grandfather   ? Stroke Maternal Grandfather   ? Liver cancer Maternal Grandfather   ? Irritable bowel syndrome Maternal Grandfather   ? Heart disease Paternal Grandmother   ? Healthy Son   ? Healthy Daughter   ? Healthy Daughter   ?  ? ?Social History  ? ?Socioeconomic History  ? Marital status: Married  ?  Spouse name: Ander Purpura  ? Number of children: 3  ? Years of education: Not on file  ? Highest education level: Not on file  ?Occupational History  ? Occupation: Truck Geophysicist/field seismologist  ?Tobacco Use  ? Smoking status: Former  ?  Packs/day: 1.00  ?  Years: 20.00  ?  Pack years: 20.00  ?  Types: Cigarettes  ?  Quit date: 11/30/2013  ?  Years since quitting: 8.2  ? Smokeless tobacco: Former  ?  Types: Snuff  ?  Quit date: 05/21/2018  ?Vaping Use  ? Vaping Use: Never used  ?Substance and Sexual Activity  ? Alcohol use: Not Currently  ? Drug use: No  ? Sexual activity: Yes  ?Other Topics Concern  ? Not on file   ?Social History Narrative  ? Lives with wife and daughter at home  ? Right Handed  ? Drinks about 2 cups caffeine daily  ? ?Social Determinants of Health  ? ?Financial Resource Strain: Not on file  ?Food Insecurity: Not on file  ?Transportation Needs: Not on file  ?Physical Activity: Not on file  ?Stress: Not on file  ?Social Connections: Not on file  ?Intimate Partner Violence: Not on file  ?  ? ?Allergies  ?Allergen Reactions  ? Diltiazem Hcl Diarrhea and Other (See Comments)  ?  Lethargic   ?  ? ?  Outpatient Medications Prior to Visit  ?Medication Sig Dispense Refill  ? acetaminophen (TYLENOL) 500 MG tablet Take 1,000 mg by mouth every 6 (six) hours as needed for mild pain.    ? albuterol (VENTOLIN HFA) 108 (90 Base) MCG/ACT inhaler Inhale 2 puffs into the lungs every 6 (six) hours as needed for wheezing or shortness of breath. 8 g 6  ? alendronate (FOSAMAX) 70 MG tablet Take 1 tablet (70 mg total) by mouth every 7 (seven) days. Take with a full glass of water on an empty stomach. (Patient not taking: Reported on 10/27/2021) 12 tablet 1  ? Alirocumab (PRALUENT) 150 MG/ML SOAJ Inject 150 mg into the skin every 14 (fourteen) days. (Patient not taking: Reported on 10/07/2021) 2 mL 3  ? atorvastatin (LIPITOR) 40 MG tablet Take 1 tablet (40 mg total) by mouth daily. 30 tablet 1  ? buPROPion (WELLBUTRIN XL) 150 MG 24 hr tablet TAKE 1 TABLET (150 MG TOTAL) BY MOUTH DAILY. 90 tablet 1  ? escitalopram (LEXAPRO) 20 MG tablet Take 1 tablet (20 mg total) by mouth daily. 90 tablet 1  ? fluticasone (FLONASE) 50 MCG/ACT nasal spray Place 1 spray into both nostrils daily as needed for allergies.    ? methocarbamol (ROBAXIN) 500 MG tablet Take 1 tablet (500 mg total) by mouth 2 (two) times daily. 20 tablet 0  ? Multiple Vitamin (MULTIVITAMIN) tablet Take by mouth daily. 2 chews daily    ? mycophenolate (CELLCEPT) 500 MG tablet Take 2 tablets (1,000 mg total) by mouth 2 (two) times daily. 360 tablet 3  ? oxyCODONE-acetaminophen  (PERCOCET/ROXICET) 5-325 MG tablet Take 1 tablet by mouth every 6 (six) hours as needed for severe pain. 4 tablet 0  ? pantoprazole (PROTONIX) 40 MG tablet Take 1 tablet (40 mg total) by mouth daily. 90 tablet 3  ?

## 2022-02-17 ENCOUNTER — Ambulatory Visit: Payer: BC Managed Care – PPO | Admitting: Physician Assistant

## 2022-02-18 ENCOUNTER — Ambulatory Visit (INDEPENDENT_AMBULATORY_CARE_PROVIDER_SITE_OTHER): Payer: BC Managed Care – PPO

## 2022-02-18 DIAGNOSIS — Z7901 Long term (current) use of anticoagulants: Secondary | ICD-10-CM

## 2022-02-18 LAB — POCT INR: INR: 4.7 — AB (ref 2.0–3.0)

## 2022-02-18 MED ORDER — WARFARIN SODIUM 5 MG PO TABS: ORAL_TABLET | ORAL | 1 refills | Status: DC

## 2022-02-18 MED ORDER — BUPROPION HCL ER (XL) 150 MG PO TB24: 150.0000 mg | ORAL_TABLET | Freq: Every day | ORAL | 1 refills | Status: DC

## 2022-02-18 NOTE — Patient Instructions (Addendum)
Pre visit review using our clinic review tool, if applicable. No additional management support is needed unless otherwise documented below in the visit note. ? ?Hold dose today and reduce dose tomorrow to only take 1 tablet and then change weekly dose to take 2 tablets daily except take 1 tablet on Wednesdays.  Re-check in 2 weeks.  ?

## 2022-02-18 NOTE — Progress Notes (Addendum)
Hold dose today and reduce dose tomorrow to only take 1 tablet and then change weekly dose to take 2 tablets daily except take 1 tablet on Wednesdays.  Re-check in 2 weeks.  ?Advised pt if any signs or symptoms of bleeding to contact the clinic or go to the ER. Pt verbalized understanding.  ? ?Pt reports he could have taken double the dose not remembering he had already taken his medication for that evening. He is not absolutely sure but reports it is possible that happened.  ?Pt reports elevated stress due to new job, mother passing, and having to travel for funeral.  ?Pt obtained a chest injury from work about 2 weeks ago, where he was hit in the chest with a large hose/metal coupling, in which he went to the ER for assessment. No fractures but pt does have a large hematoma on the center of his chest. No active bleeding.  ?Pt also asked for a refill of warfarin be sent to new pharmacy, [place]. He reported also that he had not received the bupropion script and he has been off of that medication due to that for several weeks. He requested bupropion also be sent to [facility]. Sent both scripts in.  ?

## 2022-02-19 ENCOUNTER — Other Ambulatory Visit: Payer: Self-pay | Admitting: Physician Assistant

## 2022-02-19 DIAGNOSIS — Z7901 Long term (current) use of anticoagulants: Secondary | ICD-10-CM

## 2022-02-20 ENCOUNTER — Telehealth: Payer: Self-pay

## 2022-02-20 NOTE — Telephone Encounter (Signed)
Pt LVM reporting he did have 2 glasses of wine 2 nights before his apt on 3/22 when his INR was 4.7.  ? ?Pt has f/u INR check in two weeks.  ?

## 2022-02-25 ENCOUNTER — Other Ambulatory Visit (HOSPITAL_COMMUNITY): Payer: Self-pay

## 2022-03-04 ENCOUNTER — Ambulatory Visit (INDEPENDENT_AMBULATORY_CARE_PROVIDER_SITE_OTHER): Payer: BC Managed Care – PPO

## 2022-03-04 DIAGNOSIS — Z7901 Long term (current) use of anticoagulants: Secondary | ICD-10-CM

## 2022-03-04 LAB — POCT INR: INR: 2 (ref 2.0–3.0)

## 2022-03-04 NOTE — Patient Instructions (Addendum)
Pre visit review using our clinic review tool, if applicable. No additional management support is needed unless otherwise documented below in the visit note. ? ?Increase dose today to take 3 tablets and increase dose tomorrow to take 4 tablets and then change weekly dose to prior dosing of taking 2 tablets daily except take 3 tablets on Mondays and Thursdays. Recheck in 3 weeks.  ?

## 2022-03-04 NOTE — Progress Notes (Addendum)
Pt reported last elevated INR was due to alcohol consumption the day before. Dosing was decreased due to elevated INR at last visit. Pt has been stable on  prior dosing for quite some time except when he missed a dose. Booster doses added for today and tomorrow and resuming prior dosing pt was stable on. Will recheck in 3 weeks.  ?Increase dose today to take 3 tablets and increase dose tomorrow to take 4 tablets and then change weekly dose to prior dosing of taking 2 tablets daily except take 3 tablets on Mondays and Thursdays. Recheck in 3 weeks.  ?

## 2022-03-10 ENCOUNTER — Telehealth: Payer: Self-pay

## 2022-03-10 DIAGNOSIS — Z7901 Long term (current) use of anticoagulants: Secondary | ICD-10-CM

## 2022-03-10 MED ORDER — WARFARIN SODIUM 5 MG PO TABS: ORAL_TABLET | ORAL | 1 refills | Status: DC

## 2022-03-10 NOTE — Telephone Encounter (Signed)
Pt called to report he now needs [facility] [place]. He did not pick up the script sent to [facility] because he was waiting on a worker's comp check. He found out today [facility] now. He would like a 90 day supply. ?Advised this would be sent in and if any problems to contact the office. Pt appreciative and verbalized understanding. ? ? ?[facility] ? ?

## 2022-03-13 NOTE — Progress Notes (Deleted)
Office Visit Note  Patient: Wesley Hancock             Date of Birth: 18-Feb-1971           MRN: [id number]             PCP: Inda Coke, PA Referring: Inda Coke, PA Visit Date: 03/27/2022 Occupation: '@GUAROCC'$ @  Subjective:  No chief complaint on file.   History of Present Illness: Wesley Hancock is a 51 y.o. male ***   Activities of Daily Living:  Patient reports morning stiffness for *** {minute/hour:19697}.   Patient {ACTIONS;DENIES/REPORTS:21021675::"Denies"} nocturnal pain.  Difficulty dressing/grooming: {ACTIONS;DENIES/REPORTS:21021675::"Denies"} Difficulty climbing stairs: {ACTIONS;DENIES/REPORTS:21021675::"Denies"} Difficulty getting out of chair: {ACTIONS;DENIES/REPORTS:21021675::"Denies"} Difficulty using hands for taps, buttons, cutlery, and/or writing: {ACTIONS;DENIES/REPORTS:21021675::"Denies"}  No Rheumatology ROS completed.   PMFS History:  Patient Active Problem List   Diagnosis Date Noted   Vitamin D deficiency 10/09/2021   Compression fracture of T10 vertebra ([place]) 10/09/2021   ILD (interstitial lung disease) ([place]) 05/27/2021   Long term (current) use of anticoagulants 03/26/2021   CVA (cerebral vascular accident) ([place]) 03/20/2021   Mixed connective tissue disease ([place]) 01/21/2021   Hyperglycemia 01/16/2021   Multinodular goiter 01/10/2021   OSA on CPAP 10/11/2020   Esophageal spasm    Colon polyp    Arthritis    APS (antiphospholipid syndrome) ([name])    Anxiety    CKD (chronic kidney disease) stage 2, GFR 60-89 ml/min 07/11/2020   Paresthesia of right foot 11/08/2019   Morbid obesity due to excess calories ([place]) c/b hbp/ dvt/PE 09/10/2019   DVT (deep venous thrombosis) ([place]) 08/30/2019   Multiple pulmonary emboli ([place]) 08/22/2019   Depression 06/24/2017   Dyslipidemia 09/18/2015   Hypertensive heart disease 09/23/2014   Nutcracker esophagus 09/21/2014   Lung nodule 09/21/2014    Past Medical History:  Diagnosis Date    Acute nonintractable headache 09/06/2019   Acute respiratory failure with hypoxia ([place]) 08/30/2019   AKI (acute kidney injury) ([place]) 08/30/2019   Antiphospholipid syndrome ([place]) 10/30/2019   Anxiety    APS (antiphospholipid syndrome) ([place])    Arthritis    Colon polyp    ? hyperplastic   Depression    Diarrhea 05/21/2020   DVT (deep venous thrombosis) ([place])    Dyspnea on exertion 09/08/2017   "Quit smoking" 2015 with onset of symptoms in 2016  Spirometry 09/08/2017  Flat f/v loop  - d/c acei 09/08/2017  - 01/04/2020   Walked RA x two laps =  approx 548f @ fast pace - stopped due to end of study/ min sob with sats of 93 % at the end of the study. - PFT's  03/08/20   FEV1 3.84 (83 % ) ratio 0..90  p 3 % improvement from saba p ? 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 06/26/20, 03/08/20   Snoring 07/09/2020   Stress fracture    in back, seeing PT   Stroke ([place])    Upper airway cough syndrome 01/05/2020   Onset mid Jan 2021 while on otc PPI  - max rx for gerd 01/04/2020 >>>      Witnessed apneic spells 07/09/2020    Family History  Problem Relation Age of  Onset   Diabetes Mother    Hyperlipidemia Mother    Hypertension Mother    Thyroid disease Mother        uncertain type--had surg--no cancer   Heart disease Father 42   Hyperlipidemia Father    Hypertension Father    Prostate cancer Father 35   Lung cancer Father 67       Dx 06/18/2017   Colon polyps Father    Irritable bowel syndrome Father    Diverticulitis Father    Diabetes Sister    Hyperlipidemia Sister    Hypertension Sister    Diabetes Maternal Grandmother    Heart disease Maternal Grandmother    Hyperlipidemia Maternal Grandmother    Hypertension Maternal Grandmother    Colon cancer Maternal Grandmother    Heart disease Maternal Grandfather    Hyperlipidemia Maternal Grandfather    Hypertension Maternal Grandfather    Stroke Maternal Grandfather    Liver cancer Maternal Grandfather    Irritable bowel syndrome Maternal Grandfather    Heart disease Paternal Grandmother    Healthy Son    Healthy Daughter    Healthy Daughter    Past Surgical History:  Procedure Laterality Date   BRONCHIAL WASHINGS  01/14/2021   Procedure: BRONCHIAL WASHINGS;  Surgeon: Freddi Starr, MD;  Location: [facility];  Service: Pulmonary;;   CHOLECYSTECTOMY     VIDEO BRONCHOSCOPY N/A 01/14/2021   Procedure: VIDEO BRONCHOSCOPY WITHOUT FLUORO;  Surgeon: Freddi Starr, MD;  Location: [facility];  Service: Pulmonary;  Laterality: N/A;   Social History   Social History Narrative   Lives with wife and daughter at home   Right Handed   Drinks about 2 cups caffeine daily   Immunization History  Administered Date(s) Administered   Influenza Inj Mdck Quad Pf 12/13/2020   Influenza,inj,Quad PF,6+ Mos 09/14/2016, 09/08/2017, 09/06/2019, 10/07/2021   Influenza,inj,quad, With Preservative 09/18/2015   PFIZER(Purple Top)SARS-COV-2 Vaccination 02/19/2020, 03/04/2020, 12/13/2020, 07/03/2021   Td 11/11/2016   Tdap 02/28/2019     Objective: Vital Signs: There were no vitals taken for  this visit.   Physical Exam   Musculoskeletal Exam: ***  CDAI Exam: CDAI Score: -- Patient Global: --; Provider Global: -- Swollen: --; Tender: -- Joint Exam 03/27/2022   No joint exam has been documented for this visit   There is currently no information documented on the homunculus. Go to the Rheumatology activity and complete the homunculus joint exam.  Investigation: No additional findings.  Imaging: No results found.  Recent Labs: Lab Results  Component Value Date   WBC 7.7 10/30/2021   HGB 14.9 10/30/2021   PLT 260 10/30/2021   NA 140 10/30/2021   K 3.4 (L) 10/30/2021   CL 104 10/30/2021   CO2 27 10/30/2021   GLUCOSE 119 (H) 10/30/2021   BUN 22 (H) 10/30/2021   CREATININE 1.11 10/30/2021   BILITOT 0.4 10/07/2021   ALKPHOS 60 10/07/2021  AST 25 10/07/2021   ALT 36 10/07/2021   PROT 7.7 10/07/2021   ALBUMIN 4.4 10/07/2021   CALCIUM 10.0 10/30/2021   GFRAA 84 10/14/2020   QFTBGOLDPLUS Negative 01/15/2021    Speciality Comments: Plaquenil 200 mg p.o. twice daily started March 26, 2021  Procedures:  No procedures performed Allergies: Diltiazem hcl   Assessment / Plan:     Visit Diagnoses: No diagnosis found.  Orders: No orders of the defined types were placed in this encounter.  No orders of the defined types were placed in this encounter.   Face-to-face time spent with patient was *** minutes. Greater than 50% of time was spent in counseling and coordination of care.  Follow-Up Instructions: No follow-ups on file.   Earnestine Mealing, CMA  Note - This record has been created using Editor, commissioning.  Chart creation errors have been sought, but may not always  have been located. Such creation errors do not reflect on  the standard of medical care.

## 2022-03-19 ENCOUNTER — Telehealth (INDEPENDENT_AMBULATORY_CARE_PROVIDER_SITE_OTHER): Payer: BC Managed Care – PPO | Admitting: Neurology

## 2022-03-19 ENCOUNTER — Telehealth: Payer: Self-pay | Admitting: Neurology

## 2022-03-19 ENCOUNTER — Other Ambulatory Visit: Payer: Self-pay

## 2022-03-19 ENCOUNTER — Telehealth: Payer: Self-pay | Admitting: Physician Assistant

## 2022-03-19 DIAGNOSIS — Z8673 Personal history of transient ischemic attack (TIA), and cerebral infarction without residual deficits: Secondary | ICD-10-CM

## 2022-03-19 MED ORDER — TELMISARTAN-HCTZ 80-25 MG PO TABS: 1.0000 | ORAL_TABLET | Freq: Every day | ORAL | 1 refills | Status: DC

## 2022-03-19 NOTE — Telephone Encounter (Signed)
.. ?  Encourage patient to contact the pharmacy for refills or they can request refills through [facility] ? ?LAST APPOINTMENT DATE:   ?02/17/22 ? ?NEXT APPOINTMENT DATE: ?N/a ? ?MEDICATION: ?telmisartan-hydrochlorothiazide (MICARDIS HCT) 80-25 MG tablet [[id number]]  ? ? ?Is the patient out of medication?  ?Completely out ? ?PHARMACY: [id number] [place] [place] - [address]  ?[address] [place] [address] ?Phone:  [phone] Fax:  [phone] ?DEA #:  [id number]?DAW Reason: -- ? ? ? ?Let patient know to contact pharmacy at the end of the day to make sure medication is ready. ? ?Please notify patient to allow 48-72 hours to process  ?

## 2022-03-19 NOTE — Progress Notes (Addendum)
I connected with  Wesley Hancock on 03/19/22 by a video enabled telemedicine application and verified that I am speaking with the correct person using two identifiers. ?  ?I discussed the limitations of evaluation and management by telemedicine. The patient expressed understanding and agreed to proceed.  ? ?Patient`s audio was not working as I could not hear him but he could hear me. Hence I called him on his land line number to do audio portion of this visit ?Patient location : Home ?Provider location : Office  ?Update 03/19/2022 ;    Patient states he is doing well.  He has had no recurrent TIA or strokelike symptoms.  He remains on warfarin which is tolerating well without bruising or bleeding and his INR is quite stable at 2.4  at last check.He is tolerating Repatha inj and lipitor and plans to see his PCP next visit for a lipid check as he did not have insurance for several months hence did not get it checked sooner. He has been having some tachycardia and has a appointment to see his cardiologist for the same.he has no neurological complaints today. ? ?Neurological exam is limited due to constraints of video exam but  AO x 3 normal speech and language. No focal weakness. Gait not tested ? ?ASSESSMENT: 51 year old male with left frontal MCA branch infarct and April 2022 likely due to antiphospholipid antibody syndrome.  Vascular risk factors of obesity, hyperlipidemia, hypertension, , sleep apnea and hypercoagulability from antiphospholipid antibody syndrome ?  ? PLAN : I had a long d/w patient about his recent stroke, antiphospholipid antibody syndrome risk for recurrent stroke/TIAs, personally independently reviewed imaging studies and stroke evaluation results and answered questions.Continue warfarin daily  for secondary stroke prevention with target INR between 2 and 3 and maintain strict control of hypertension with blood pressure goal below 130/90, diabetes with hemoglobin A1c goal below 6.5% and  lipids with LDL cholesterol goal below 70 mg/dL. advised the patient to eat a healthy diet with plenty of whole grains, cereals, fruits and vegetables, exercise regularly and maintain ideal body weight .recommend he start I   Followup in the future with me in 1 years or call earlier if necessary.Greater than 50% time during this 25-minute visit was spent on counseling and coordination of care about his stroke,   and discussion about stroke prevention ? ?Antony Contras, MD ?Medical Director ?[facility] ?Pager: [phone] ?03/19/2022 2:44 PM ? ? ?To contact Stroke Continuity provider, please refer to [email or web address] ?After hours, contact General Neurology ? ? ? ?

## 2022-03-19 NOTE — Telephone Encounter (Signed)
..   Pt understands that although there may be some limitations with this type of visit, we will take all precautions to reduce any security or privacy concerns.  Pt understands that this will be treated like an in office visit and we will file with pt's insurance, and there may be a patient responsible charge related to this service. ? ?

## 2022-03-19 NOTE — Telephone Encounter (Signed)
Noted  

## 2022-03-24 NOTE — Progress Notes (Deleted)
Office Visit Note  Patient: Wesley Hancock             Date of Birth: 05-Apr-1971           MRN: [id number]             PCP: Inda Coke, PA Referring: Inda Coke, PA Visit Date: 03/31/2022 Occupation: '@GUAROCC'$ @  Subjective:  Discuss AVISE results  History of Present Illness: Catalino Plascencia is a 51 y.o. male with history of mixed connective tissue disease, ILD, and APS.  He remains on cellcept as prescribed by Dr. Erin Fulling.   no longer taking prednisone.  CT chest showed bilateral groundglass infiltrates but overall the mild fibrotic changes substantially improved on 08/21/2021 compared to previous studies.  CXR on 02/08/22 did not reveal any active cardiopulmomary disease.   Activities of Daily Living:  Patient reports morning stiffness for *** {minute/hour:19697}.   Patient {ACTIONS;DENIES/REPORTS:21021675::"Denies"} nocturnal pain.  Difficulty dressing/grooming: {ACTIONS;DENIES/REPORTS:21021675::"Denies"} Difficulty climbing stairs: {ACTIONS;DENIES/REPORTS:21021675::"Denies"} Difficulty getting out of chair: {ACTIONS;DENIES/REPORTS:21021675::"Denies"} Difficulty using hands for taps, buttons, cutlery, and/or writing: {ACTIONS;DENIES/REPORTS:21021675::"Denies"}  No Rheumatology ROS completed.   PMFS History:  Patient Active Problem List   Diagnosis Date Noted   Vitamin D deficiency 10/09/2021   Compression fracture of T10 vertebra ([place]) 10/09/2021   ILD (interstitial lung disease) ([place]) 05/27/2021   Long term (current) use of anticoagulants 03/26/2021   CVA (cerebral vascular accident) ([place]) 03/20/2021   Mixed connective tissue disease ([place]) 01/21/2021   Hyperglycemia 01/16/2021   Multinodular goiter 01/10/2021   OSA on CPAP 10/11/2020   Esophageal spasm    Colon polyp    Arthritis    APS (antiphospholipid syndrome) ([name])    Anxiety    CKD (chronic kidney disease) stage 2, GFR 60-89 ml/min 07/11/2020   Paresthesia of right foot 11/08/2019   Morbid  obesity due to excess calories ([place]) c/b hbp/ dvt/PE 09/10/2019   DVT (deep venous thrombosis) ([place]) 08/30/2019   Multiple pulmonary emboli ([place]) 08/22/2019   Depression 06/24/2017   Dyslipidemia 09/18/2015   Hypertensive heart disease 09/23/2014   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 hyperplastic   Depression    Diarrhea 05/21/2020   DVT (deep venous thrombosis) ([place])    Dyspnea on exertion 09/08/2017   "Quit smoking" 2015 with onset of symptoms in 2016  Spirometry 09/08/2017  Flat f/v loop  - d/c acei 09/08/2017  - 01/04/2020   Walked RA x two laps =  approx 567f @ fast pace - stopped due to end of study/ min sob with sats of 93 % at the end of the study. - PFT's  03/08/20   FEV1 3.84 (83 % ) ratio 0..90  p 3 % improvement from saba p ? 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 06/26/20, 03/08/20   Snoring 07/09/2020   Stress fracture    in back, seeing PT   Stroke ([place])    Upper airway cough syndrome 01/05/2020   Onset mid Jan 2021 while on otc PPI  - max rx for gerd 01/04/2020 >>>      Witnessed apneic spells 07/09/2020    Family History  Problem Relation Age of Onset   Diabetes Mother    Hyperlipidemia Mother    Hypertension Mother    Thyroid disease Mother        uncertain type--had surg--no cancer   Heart disease Father 29   Hyperlipidemia Father    Hypertension Father    Prostate cancer Father 50   Lung cancer Father 44       Dx 06/18/2017   Colon polyps Father    Irritable bowel syndrome Father    Diverticulitis Father    Diabetes Sister    Hyperlipidemia Sister    Hypertension Sister    Diabetes Maternal Grandmother    Heart disease Maternal Grandmother    Hyperlipidemia Maternal Grandmother    Hypertension Maternal Grandmother    Colon cancer Maternal Grandmother    Heart disease Maternal Grandfather    Hyperlipidemia Maternal Grandfather    Hypertension Maternal Grandfather    Stroke Maternal Grandfather    Liver cancer Maternal Grandfather    Irritable bowel syndrome Maternal Grandfather    Heart disease Paternal Grandmother    Healthy Son    Healthy Daughter    Healthy Daughter    Past Surgical History:  Procedure Laterality Date   BRONCHIAL WASHINGS  01/14/2021   Procedure: BRONCHIAL WASHINGS;  Surgeon: Freddi Starr, MD;  Location: [facility];  Service: Pulmonary;;   CHOLECYSTECTOMY     VIDEO BRONCHOSCOPY N/A 01/14/2021   Procedure: VIDEO BRONCHOSCOPY WITHOUT FLUORO;  Surgeon: Freddi Starr, MD;  Location: [facility];  Service: Pulmonary;  Laterality: N/A;   Social History   Social History Narrative   Lives with wife and daughter at home   Right Handed   Drinks about 2 cups caffeine daily   Immunization History  Administered Date(s) Administered    Influenza Inj Mdck Quad Pf 12/13/2020   Influenza,inj,Quad PF,6+ Mos 09/14/2016, 09/08/2017, 09/06/2019, 10/07/2021   Influenza,inj,quad, With Preservative 09/18/2015   PFIZER(Purple Top)SARS-COV-2 Vaccination 02/19/2020, 03/04/2020, 12/13/2020, 07/03/2021   Td 11/11/2016   Tdap 02/28/2019     Objective: Vital Signs: There were no vitals taken for this visit.   Physical Exam Vitals and nursing note reviewed.  Constitutional:      Appearance: He is well-developed.  HENT:     Head: Normocephalic and atraumatic.  Eyes:     Conjunctiva/sclera: Conjunctivae normal.     Pupils: Pupils are equal, round, and reactive to light.  Cardiovascular:     Rate and Rhythm: Normal rate and regular rhythm.     Heart sounds: Normal heart sounds.  Pulmonary:     Effort: Pulmonary effort is normal.     Breath  sounds: Normal breath sounds.  Abdominal:     General: Bowel sounds are normal.     Palpations: Abdomen is soft.  Musculoskeletal:     Cervical back: Normal range of motion and neck supple.  Skin:    General: Skin is warm and dry.     Capillary Refill: Capillary refill takes less than 2 seconds.  Neurological:     Mental Status: He is alert and oriented to person, place, and time.  Psychiatric:        Behavior: Behavior normal.     Musculoskeletal Exam: ***  CDAI Exam: CDAI Score: -- Patient Global: --; Provider Global: -- Swollen: --; Tender: -- Joint Exam 03/31/2022   No joint exam has been documented for this visit   There is currently no information documented on the homunculus. Go to the Rheumatology activity and complete the homunculus joint exam.  Investigation: No additional findings.  Imaging: No results found.  Recent Labs: Lab Results  Component Value Date   WBC 7.7 10/30/2021   HGB 14.9 10/30/2021   PLT 260 10/30/2021   NA 140 10/30/2021   K 3.4 (L) 10/30/2021   CL 104 10/30/2021   CO2 27 10/30/2021   GLUCOSE 119 (H) 10/30/2021   BUN 22 (H) 10/30/2021    CREATININE 1.11 10/30/2021   BILITOT 0.4 10/07/2021   ALKPHOS 60 10/07/2021   AST 25 10/07/2021   ALT 36 10/07/2021   PROT 7.7 10/07/2021   ALBUMIN 4.4 10/07/2021   CALCIUM 10.0 10/30/2021   GFRAA 84 10/14/2020   QFTBGOLDPLUS Negative 01/15/2021    Speciality Comments: Plaquenil 200 mg p.o. twice daily started March 26, 2021  Procedures:  No procedures performed Allergies: Diltiazem hcl   Assessment / Plan:     Visit Diagnoses: No diagnosis found.  Orders: No orders of the defined types were placed in this encounter.  No orders of the defined types were placed in this encounter.   Face-to-face time spent with patient was *** minutes. Greater than 50% of time was spent in counseling and coordination of care.  Follow-Up Instructions: No follow-ups on file.   Earnestine Mealing, CMA  Note - This record has been created using Editor, commissioning.  Chart creation errors have been sought, but may not always  have been located. Such creation errors do not reflect on  the standard of medical care.

## 2022-03-25 ENCOUNTER — Ambulatory Visit: Payer: BC Managed Care – PPO

## 2022-03-27 ENCOUNTER — Ambulatory Visit: Payer: BC Managed Care – PPO | Admitting: Rheumatology

## 2022-03-27 DIAGNOSIS — I2699 Other pulmonary embolism without acute cor pulmonale: Secondary | ICD-10-CM

## 2022-03-27 DIAGNOSIS — R918 Other nonspecific abnormal finding of lung field: Secondary | ICD-10-CM

## 2022-03-27 DIAGNOSIS — Z79899 Other long term (current) drug therapy: Secondary | ICD-10-CM

## 2022-03-27 DIAGNOSIS — M7061 Trochanteric bursitis, right hip: Secondary | ICD-10-CM

## 2022-03-27 DIAGNOSIS — I119 Hypertensive heart disease without heart failure: Secondary | ICD-10-CM

## 2022-03-27 DIAGNOSIS — R5383 Other fatigue: Secondary | ICD-10-CM

## 2022-03-27 DIAGNOSIS — D6861 Antiphospholipid syndrome: Secondary | ICD-10-CM

## 2022-03-27 DIAGNOSIS — K224 Dyskinesia of esophagus: Secondary | ICD-10-CM

## 2022-03-27 DIAGNOSIS — I639 Cerebral infarction, unspecified: Secondary | ICD-10-CM

## 2022-03-27 DIAGNOSIS — E042 Nontoxic multinodular goiter: Secondary | ICD-10-CM

## 2022-03-27 DIAGNOSIS — I82401 Acute embolism and thrombosis of unspecified deep veins of right lower extremity: Secondary | ICD-10-CM

## 2022-03-27 DIAGNOSIS — Z9989 Dependence on other enabling machines and devices: Secondary | ICD-10-CM

## 2022-03-27 DIAGNOSIS — J849 Interstitial pulmonary disease, unspecified: Secondary | ICD-10-CM

## 2022-03-27 DIAGNOSIS — E782 Mixed hyperlipidemia: Secondary | ICD-10-CM

## 2022-03-27 DIAGNOSIS — G8929 Other chronic pain: Secondary | ICD-10-CM

## 2022-03-27 DIAGNOSIS — F411 Generalized anxiety disorder: Secondary | ICD-10-CM

## 2022-03-27 DIAGNOSIS — M351 Other overlap syndromes: Secondary | ICD-10-CM

## 2022-03-31 ENCOUNTER — Ambulatory Visit: Payer: BC Managed Care – PPO | Admitting: Physician Assistant

## 2022-03-31 DIAGNOSIS — Z79899 Other long term (current) drug therapy: Secondary | ICD-10-CM

## 2022-03-31 DIAGNOSIS — E782 Mixed hyperlipidemia: Secondary | ICD-10-CM

## 2022-03-31 DIAGNOSIS — J849 Interstitial pulmonary disease, unspecified: Secondary | ICD-10-CM

## 2022-03-31 DIAGNOSIS — M351 Other overlap syndromes: Secondary | ICD-10-CM

## 2022-03-31 DIAGNOSIS — I119 Hypertensive heart disease without heart failure: Secondary | ICD-10-CM

## 2022-03-31 DIAGNOSIS — E042 Nontoxic multinodular goiter: Secondary | ICD-10-CM

## 2022-03-31 DIAGNOSIS — G4733 Obstructive sleep apnea (adult) (pediatric): Secondary | ICD-10-CM

## 2022-03-31 DIAGNOSIS — M7061 Trochanteric bursitis, right hip: Secondary | ICD-10-CM

## 2022-03-31 DIAGNOSIS — G8929 Other chronic pain: Secondary | ICD-10-CM

## 2022-03-31 DIAGNOSIS — R918 Other nonspecific abnormal finding of lung field: Secondary | ICD-10-CM

## 2022-03-31 DIAGNOSIS — I639 Cerebral infarction, unspecified: Secondary | ICD-10-CM

## 2022-03-31 DIAGNOSIS — K224 Dyskinesia of esophagus: Secondary | ICD-10-CM

## 2022-03-31 DIAGNOSIS — R5383 Other fatigue: Secondary | ICD-10-CM

## 2022-03-31 DIAGNOSIS — I2699 Other pulmonary embolism without acute cor pulmonale: Secondary | ICD-10-CM

## 2022-03-31 DIAGNOSIS — I82401 Acute embolism and thrombosis of unspecified deep veins of right lower extremity: Secondary | ICD-10-CM

## 2022-03-31 DIAGNOSIS — F411 Generalized anxiety disorder: Secondary | ICD-10-CM

## 2022-03-31 DIAGNOSIS — D6861 Antiphospholipid syndrome: Secondary | ICD-10-CM

## 2022-04-01 ENCOUNTER — Telehealth: Payer: Self-pay

## 2022-04-01 NOTE — Telephone Encounter (Signed)
Pt missed coumadin clinic apt last week and has not called to RS.  ?Tried to contact pt but no answer and VM is full. ?Tried to contact pt's wife, Ander Purpura, but no answer and VM is full. ?

## 2022-04-08 NOTE — Telephone Encounter (Signed)
Tried to contact pt but no answer and VM is full.  

## 2022-04-09 NOTE — Telephone Encounter (Signed)
Tried to contact pt to RS apt. No answer and VM is full. ?

## 2022-04-15 ENCOUNTER — Ambulatory Visit (INDEPENDENT_AMBULATORY_CARE_PROVIDER_SITE_OTHER): Payer: BC Managed Care – PPO

## 2022-04-15 ENCOUNTER — Encounter: Payer: Self-pay | Admitting: Physician Assistant

## 2022-04-15 ENCOUNTER — Ambulatory Visit: Payer: BC Managed Care – PPO | Admitting: Physician Assistant

## 2022-04-15 VITALS — BP 124/86 | HR 83 | Temp 97.3°F | Ht 74.0 in | Wt 310.0 lb

## 2022-04-15 DIAGNOSIS — Z7901 Long term (current) use of anticoagulants: Secondary | ICD-10-CM | POA: Diagnosis not present

## 2022-04-15 DIAGNOSIS — I639 Cerebral infarction, unspecified: Secondary | ICD-10-CM

## 2022-04-15 DIAGNOSIS — F32A Depression, unspecified: Secondary | ICD-10-CM | POA: Diagnosis not present

## 2022-04-15 DIAGNOSIS — F419 Anxiety disorder, unspecified: Secondary | ICD-10-CM | POA: Diagnosis not present

## 2022-04-15 LAB — POCT INR: INR: 2 (ref 2.0–3.0)

## 2022-04-15 MED ORDER — BUPROPION HCL ER (XL) 150 MG PO TB24: 150.0000 mg | ORAL_TABLET | Freq: Every day | ORAL | 3 refills | Status: DC

## 2022-04-15 MED ORDER — BUPROPION HCL 75 MG PO TABS: 75.0000 mg | ORAL_TABLET | Freq: Two times a day (BID) | ORAL | 0 refills | Status: DC

## 2022-04-15 NOTE — Patient Instructions (Addendum)
It was great to see you! ? ?Restart your 150 mg wellbutrin -- I sent in two (one is daily and one is twice daily -- ask the pharmacist to give you the cheaper one) ? ?Please call Larene Beach at [facility] to schedule an appointment -- tell her I sent you! ?[address], [place], [place] [address] ?Phone: [phone] ? ?Please try to get your phone voicemail fixed!! ? ?Send me a mychart message in about a month to see how your mental health is doing. ? ?Take care, ? ?Inda Coke PA-C  ?

## 2022-04-15 NOTE — Progress Notes (Signed)
Wesley Hancock is a 51 y.o. male here for a follow up of a pre-existing problem. ? ?History of Present Illness:  ? ?Chief Complaint  ?Patient presents with  ? Depression  ? Anxiety  ? ? ?HPI ? ?Anxiety/Depression  ?Patient complains of worsening anxiety and depression for the past few months. He has increased sx since his mother passed away due to sepsis pneumonia. He states he has not had enough time to grieve for his mother. He was out of work for 4 weeks due to this. He is currently taking Lexapro 20 mg daily.  He notes he has not taken his Wellbutrin for a while due to cost.  He is tolerating his medication well without any side effects.  He would like to see therapist for this issue. No SI/HI.  ? ? ?Past Medical History:  ?Diagnosis Date  ? Acute nonintractable headache 09/06/2019  ? Acute respiratory failure with hypoxia ([place]) 08/30/2019  ? AKI (acute kidney injury) ([place]) 08/30/2019  ? Antiphospholipid syndrome ([place]) 10/30/2019  ? Anxiety   ? APS (antiphospholipid syndrome) ([place])   ? Arthritis   ? Colon polyp   ? ? hyperplastic  ? Depression   ? Diarrhea 05/21/2020  ? DVT (deep venous thrombosis) ([place])   ? Dyspnea on exertion 09/08/2017  ? "Quit smoking" 2015 with onset of symptoms in 2016  Spirometry 09/08/2017  Flat f/v loop  - d/c acei 09/08/2017  - 01/04/2020   Walked RA x two laps =  approx 555f @ fast pace - stopped due to end of study/ min sob with sats of 93 % at the end of the study. - PFT's  03/08/20   FEV1 3.84 (83 % ) ratio 0..90  p 3 % improvement from saba p ? prior to study with DLCO  26.46 (77%) corrects to 4.76 (99%)    ? Esophageal spasm   ? Essential hypertension 09/23/2014  ? D/c acei 09/08/2017 due to pseudocopd   ? GAD (generalized anxiety disorder) 08/06/2016  ? Lobar pneumonia ([facility] 08/30/2019  ? Lung nodule 09/21/2014  ? Lupus ([place] [place]   ? Mixed hyperlipidemia 09/18/2015  ? Morbid obesity due to excess calories ([name]) c/b hbp/ dvt/PE 09/10/2019  ? Multiple pulmonary emboli ([facility]  08/22/2019  ? 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 Nutcracker esophagus   ? Paresthesia of right foot 11/08/2019  ? Pulmonary infiltrates 09/10/2019  ? In setting of bilateral PE 08/23/2019 with antiphospholipid syndrome   ? Recurrent pneumonia 07/09/2020  ? Formatting of this note might be different from the original. 06/26/20, 03/08/20  ? Snoring 07/09/2020  ? Stress fracture   ? in back, seeing PT  ? Stroke ([facility]   ? Upper airway cough syndrome 01/05/2020  ? Onset mid Jan 2021 while on otc PPI  - max rx for gerd 01/04/2020 >>>     ? Witnessed apneic spells 07/09/2020  ? ?  ?Social History  ? ?Tobacco Use  ? Smoking status: Former  ?  Packs/day: 1.00  ?  Years: 20.00  ?  Pack years: 20.00  ?  Types: Cigarettes  ?  Quit date: 11/30/2013  ?  Years since quitting: 8.3  ? Smokeless tobacco: Former  ?  Types: Snuff  ?  Quit date: 05/21/2018  ?Vaping Use  ? Vaping Use: Never used  ?Substance Use Topics  ?  Alcohol use: Not Currently  ? Drug use: No  ? ? ?Past Surgical History:  ?Procedure Laterality Date  ? BRONCHIAL WASHINGS  01/14/2021  ? Procedure: BRONCHIAL WASHINGS;  Surgeon: Freddi Starr, MD;  Location: [facility];  Service: Pulmonary;;  ? CHOLECYSTECTOMY    ? VIDEO BRONCHOSCOPY N/A 01/14/2021  ? Procedure: VIDEO BRONCHOSCOPY WITHOUT FLUORO;  Surgeon: Freddi Starr, MD;  Location: [facility];  Service: Pulmonary;  Laterality: N/A;  ? ? ?Family History  ?Problem Relation Age of Onset  ? Diabetes Mother   ? Hyperlipidemia Mother   ? Hypertension Mother   ? Thyroid disease Mother   ?     uncertain type--had surg--no cancer  ? Heart disease Father 82  ? Hyperlipidemia Father   ? Hypertension Father   ? Prostate cancer Father 85  ? Lung cancer Father 55  ?     Dx 06/18/2017  ? Colon polyps Father   ? Irritable bowel syndrome Father   ? Diverticulitis Father   ?  Diabetes Sister   ? Hyperlipidemia Sister   ? Hypertension Sister   ? Diabetes Maternal Grandmother   ? Heart disease Maternal Grandmother   ? Hyperlipidemia Maternal Grandmother   ? Hypertension Maternal Grandmother   ? Colon cancer Maternal Grandmother   ? Heart disease Maternal Grandfather   ? Hyperlipidemia Maternal Grandfather   ? Hypertension Maternal Grandfather   ? Stroke Maternal Grandfather   ? Liver cancer Maternal Grandfather   ? Irritable bowel syndrome Maternal Grandfather   ? Heart disease Paternal Grandmother   ? Healthy Son   ? Healthy Daughter   ? Healthy Daughter   ? ? ?Allergies  ?Allergen Reactions  ? Diltiazem Hcl Diarrhea and Other (See Comments)  ?  Lethargic   ? ? ?Current Medications:  ? ?Current Outpatient Medications:  ?  acetaminophen (TYLENOL) 500 MG tablet, Take 1,000 mg by mouth every 6 (six) hours as needed for mild pain., Disp: , Rfl:  ?  albuterol (VENTOLIN HFA) 108 (90 Base) MCG/ACT inhaler, Inhale 2 puffs into the lungs every 6 (six) hours as needed for wheezing or shortness of breath., Disp: 8 g, Rfl: 6 ?  atorvastatin (LIPITOR) 40 MG tablet, Take 1 tablet (40 mg total) by mouth daily., Disp: 30 tablet, Rfl: 1 ?  buPROPion (WELLBUTRIN XL) 150 MG 24 hr tablet, Take 1 tablet (150 mg total) by mouth daily., Disp: 90 tablet, Rfl: 1 ?  escitalopram (LEXAPRO) 20 MG tablet, Take 1 tablet (20 mg total) by mouth daily., Disp: 90 tablet, Rfl: 1 ?  fluticasone (FLONASE) 50 MCG/ACT nasal spray, Place 1 spray into both nostrils daily as needed for allergies., Disp: , Rfl:  ?  Multiple Vitamin (MULTIVITAMIN) tablet, Take by mouth daily. 2 chews daily, Disp: , Rfl:  ?  mycophenolate (CELLCEPT) 500 MG tablet, Take 2 tablets (1,000 mg total) by mouth 2 (two) times daily., Disp: 360 tablet, Rfl: 3 ?  pantoprazole (PROTONIX) 40 MG tablet, Take 1 tablet (40 mg total) by mouth daily., Disp: 90 tablet, Rfl: 3 ?  telmisartan-hydrochlorothiazide (MICARDIS HCT) 80-25 MG tablet, Take 1 tablet by mouth  daily., Disp: 30 tablet, Rfl: 1 ?  warfarin (COUMADIN) 5 MG tablet, TAKE 2 TABLETS DAILY BY MOUTH EXCEPT TAKE 3 TABLETS ON MONDAYS AND THURSDAYS OR AS DIRECTED BY ANTICOAGULATION CLINIC., Disp: 200 tablet, Rfl: 1  ? ?Review of Systems:  ? ?ROS ?Negative unless otherwise specified per HPI.  ? ?Vitals:  ? ?Vitals:  ? 04/15/22 0850  ?  BP: 124/86  ?Pulse: 83  ?Temp: (!) 97.3 ?F (36.3 ?C)  ?TempSrc: Temporal  ?SpO2: 96%  ?Weight: (!) 310 lb (140.6 kg)  ?Height: '6\' 2"'$  (1.88 m)  ?   ?Body mass index is 39.8 kg/m?. ? ?Physical Exam:  ? ?Physical Exam ?Vitals and nursing note reviewed.  ?Constitutional:   ?   General: He is not in acute distress. ?   Appearance: He is well-developed. He is not ill-appearing or toxic-appearing.  ?Cardiovascular:  ?   Rate and Rhythm: Normal rate and regular rhythm.  ?   Pulses: Normal pulses.  ?   Heart sounds: Normal heart sounds, S1 normal and S2 normal.  ?Pulmonary:  ?   Effort: Pulmonary effort is normal.  ?   Breath sounds: Normal breath sounds.  ?Skin: ?   General: Skin is warm and dry.  ?Neurological:  ?   Mental Status: He is alert.  ?   GCS: GCS eye subscore is 4. GCS verbal subscore is 5. GCS motor subscore is 6.  ?Psychiatric:     ?   Speech: Speech normal.     ?   Behavior: Behavior normal. Behavior is cooperative.  ? ? ?Assessment and Plan:  ? ?Anxiety and depression ?Worsening ?Restart wellbutrin 75 mg BID (this is most affordable for him) ?Continue lexapro 20 mg ?Follow-up via mychart message in 1 month ?Larene Beach from [facility] information provided ? ?Cerebrovascular accident (CVA), unspecified mechanism ([place]) ?I have sent referral to Care Coordinator to see if he can meet with our pharmacist to figure out how to make his praluent more affordable ? ? ?I,Savera Zaman,acting as a scribe for [employer], PA.,have documented all relevant documentation on the behalf of Inda Coke, PA,as directed by  Inda Coke, PA while in the presence of Inda Coke, [place].   ? ?IInda Coke, PA, have reviewed all documentation for this visit. The documentation on 04/15/22 for the exam, diagnosis, procedures, and orders are all accurate and complete. ? ?Time spent with patient today was

## 2022-04-15 NOTE — Patient Instructions (Addendum)
Pre visit review using our clinic review tool, if applicable. No additional management support is needed unless otherwise documented below in the visit note. ? ?Increase dose today to take 3 tablets and increase dose tomorrow to take 4 tablets and then change weekly dose to prior dosing of taking 2 tablets daily except take 3 tablets on Mondays, Wednesdays and Fridays. Recheck in 2 weeks.  ?

## 2022-04-15 NOTE — Progress Notes (Signed)
Increase dose today to take 3 tablets and increase dose tomorrow to take 4 tablets and then change weekly dose to prior dosing of taking 2 tablets daily except take 3 tablets on Mondays, Wednesdays and Fridays. Recheck in 2 weeks.

## 2022-04-29 ENCOUNTER — Ambulatory Visit (INDEPENDENT_AMBULATORY_CARE_PROVIDER_SITE_OTHER): Payer: BC Managed Care – PPO

## 2022-04-29 DIAGNOSIS — Z7901 Long term (current) use of anticoagulants: Secondary | ICD-10-CM

## 2022-04-29 LAB — POCT INR: INR: 1.7 — AB (ref 2.0–3.0)

## 2022-04-29 MED ORDER — WARFARIN SODIUM 5 MG PO TABS: ORAL_TABLET | ORAL | 1 refills | Status: DC

## 2022-04-29 NOTE — Progress Notes (Signed)
Pt reports he missed 4 days of dosing last week due to being out of medication and was not able to pick up script. Educated  pt on importance of taking medication as directed. Pt reports he has one refill at pharmacy to pick up but would like a refill sent so he is sure not to run out next time. Sent in script.   Due to pt missing 4 days of dosing he increased two days of dosing by 10 mg total. Pt never started new dosing instructions due to missed doses so he will increase dose today and then change dosing as instructed at the last visit.   Increase dose today to take 4 tablets and then change weekly dose to prior dosing of taking 2 tablets daily except take 3 tablets on Mondays, Wednesdays and Fridays. Recheck in 2 weeks.

## 2022-04-29 NOTE — Patient Instructions (Addendum)
Pre visit review using our clinic review tool, if applicable. No additional management support is needed unless otherwise documented below in the visit note.  Increase dose today to take 4 tablets and then change weekly dose to prior dosing of taking 2 tablets daily except take 3 tablets on Mondays, Wednesdays and Fridays. Recheck in 2 weeks.

## 2022-05-10 DIAGNOSIS — J189 Pneumonia, unspecified organism: Secondary | ICD-10-CM | POA: Diagnosis not present

## 2022-05-10 DIAGNOSIS — R0602 Shortness of breath: Secondary | ICD-10-CM | POA: Insufficient documentation

## 2022-05-10 DIAGNOSIS — Z20822 Contact with and (suspected) exposure to covid-19: Secondary | ICD-10-CM | POA: Insufficient documentation

## 2022-05-10 MED ORDER — ALBUTEROL SULFATE (2.5 MG/3ML) 0.083% IN NEBU
INHALATION_SOLUTION | RESPIRATORY_TRACT | Status: AC
  Administered 2022-05-10: 2.5 mg via RESPIRATORY_TRACT
  Filled 2022-05-10: qty 3

## 2022-05-10 MED ORDER — ALBUTEROL SULFATE (2.5 MG/3ML) 0.083% IN NEBU: 2.5000 mg | INHALATION_SOLUTION | Freq: Once | RESPIRATORY_TRACT | Status: AC

## 2022-05-10 MED ORDER — IPRATROPIUM-ALBUTEROL 0.5-2.5 (3) MG/3ML IN SOLN
RESPIRATORY_TRACT | Status: AC
  Administered 2022-05-10: 3 mL via RESPIRATORY_TRACT
  Filled 2022-05-10: qty 3

## 2022-05-10 MED ORDER — IPRATROPIUM-ALBUTEROL 0.5-2.5 (3) MG/3ML IN SOLN: 3.0000 mL | Freq: Once | RESPIRATORY_TRACT | Status: AC

## 2022-05-10 NOTE — ED Triage Notes (Signed)
POV, sob began at work today, pt sat down at work for 45 mins and felt better, hx of interstital lung disease, bilateral PE. Labored breathing at rest. Breathing tx administered by RT.

## 2022-05-11 ENCOUNTER — Emergency Department (HOSPITAL_BASED_OUTPATIENT_CLINIC_OR_DEPARTMENT_OTHER): Payer: BC Managed Care – PPO

## 2022-05-11 ENCOUNTER — Encounter (HOSPITAL_BASED_OUTPATIENT_CLINIC_OR_DEPARTMENT_OTHER): Payer: Self-pay | Admitting: Emergency Medicine

## 2022-05-11 ENCOUNTER — Other Ambulatory Visit: Payer: Self-pay

## 2022-05-11 ENCOUNTER — Emergency Department (HOSPITAL_BASED_OUTPATIENT_CLINIC_OR_DEPARTMENT_OTHER)
Admission: EM | Admit: 2022-05-11 | Discharge: 2022-05-11 | Disposition: A | Discharge status: Home / Self Care | Payer: BC Managed Care – PPO | Attending: Emergency Medicine | Admitting: Emergency Medicine

## 2022-05-11 DIAGNOSIS — J189 Pneumonia, unspecified organism: Secondary | ICD-10-CM

## 2022-05-11 DIAGNOSIS — R079 Chest pain, unspecified: Secondary | ICD-10-CM | POA: Diagnosis not present

## 2022-05-11 DIAGNOSIS — R0602 Shortness of breath: Secondary | ICD-10-CM | POA: Diagnosis not present

## 2022-05-11 LAB — TROPONIN I (HIGH SENSITIVITY)
Troponin I (High Sensitivity): 4 ng/L (ref <18)
Troponin I (High Sensitivity): 4 ng/L (ref <18)

## 2022-05-11 LAB — BASIC METABOLIC PANEL
Anion gap: 14 (ref 5–15)
BUN: 14 mg/dL (ref 6–20)
CO2: 27 mmol/L (ref 22–32)
Calcium: 10.3 mg/dL (ref 8.9–10.3)
Chloride: 97 mmol/L — ABNORMAL LOW (ref 98–111)
Creatinine, Ser: 1.09 mg/dL (ref 0.61–1.24)
GFR, Estimated: >60 mL/min (ref >60)
Glucose, Bld: 108 mg/dL — ABNORMAL HIGH (ref 70–99)
Potassium: 3.3 mmol/L — ABNORMAL LOW (ref 3.5–5.1)
Sodium: 138 mmol/L (ref 135–145)

## 2022-05-11 LAB — CBC WITH DIFFERENTIAL/PLATELET
Abs Immature Granulocytes: 0.03 10*3/uL (ref 0.00–0.07)
Basophils Absolute: 0 10*3/uL (ref 0.0–0.1)
Basophils Relative: 0 %
Eosinophils Absolute: 0.1 10*3/uL (ref 0.0–0.5)
Eosinophils Relative: 1 %
HCT: 46.5 % (ref 39.0–52.0)
Hemoglobin: 15.3 g/dL (ref 13.0–17.0)
Immature Granulocytes: 0 %
Lymphocytes Relative: 19 %
Lymphs Abs: 1.8 10*3/uL (ref 0.7–4.0)
MCH: 29.3 pg (ref 26.0–34.0)
MCHC: 32.9 g/dL (ref 30.0–36.0)
MCV: 89.1 fL (ref 80.0–100.0)
Monocytes Absolute: 0.6 10*3/uL (ref 0.1–1.0)
Monocytes Relative: 7 %
Neutro Abs: 6.9 10*3/uL (ref 1.7–7.7)
Neutrophils Relative %: 73 %
Platelets: 263 10*3/uL (ref 150–400)
RBC: 5.22 MIL/uL (ref 4.22–5.81)
RDW: 13.2 % (ref 11.5–15.5)
WBC: 9.5 10*3/uL (ref 4.0–10.5)
nRBC: 0 % (ref 0.0–0.2)

## 2022-05-11 LAB — PROTIME-INR
INR: 2.4 — ABNORMAL HIGH (ref 0.8–1.2)
Prothrombin Time: 26.2 seconds — ABNORMAL HIGH (ref 11.4–15.2)

## 2022-05-11 LAB — SARS CORONAVIRUS 2 BY RT PCR: SARS Coronavirus 2 by RT PCR: NEGATIVE

## 2022-05-11 MED ORDER — ALBUTEROL SULFATE HFA 108 (90 BASE) MCG/ACT IN AERS
2.0000 | INHALATION_SPRAY | RESPIRATORY_TRACT | Status: DC | PRN
  Filled 2022-05-11: qty 6.7

## 2022-05-11 MED ORDER — AEROCHAMBER PLUS FLO-VU MISC
1.0000 | Freq: Once | Status: AC
  Administered 2022-05-11: 1
  Filled 2022-05-11: qty 1

## 2022-05-11 MED ORDER — IOHEXOL 350 MG/ML SOLN
80.0000 mL | Freq: Once | INTRAVENOUS | Status: AC | PRN
Start: 2022-05-11 — End: 2022-05-11
  Administered 2022-05-11: 65 mL via INTRAVENOUS

## 2022-05-11 MED ORDER — WARFARIN SODIUM 5 MG PO TABS
5.0000 mg | ORAL_TABLET | ORAL | Status: AC
  Administered 2022-05-11: 5 mg via ORAL
  Filled 2022-05-11: qty 1

## 2022-05-11 MED ORDER — METHYLPREDNISOLONE SODIUM SUCC 125 MG IJ SOLR
125.0000 mg | INTRAMUSCULAR | Status: AC
  Administered 2022-05-11: 125 mg via INTRAVENOUS
  Filled 2022-05-11: qty 2

## 2022-05-11 MED ORDER — POTASSIUM CHLORIDE CRYS ER 20 MEQ PO TBCR
20.0000 meq | EXTENDED_RELEASE_TABLET | Freq: Once | ORAL | Status: AC
  Administered 2022-05-11: 20 meq via ORAL
  Filled 2022-05-11: qty 1

## 2022-05-11 MED ORDER — AMOXICILLIN-POT CLAVULANATE 875-125 MG PO TABS
1.0000 | ORAL_TABLET | Freq: Once | ORAL | Status: AC
  Administered 2022-05-11: 1 via ORAL
  Filled 2022-05-11: qty 1

## 2022-05-11 MED ORDER — AMOXICILLIN-POT CLAVULANATE 875-125 MG PO TABS: 1.0000 | ORAL_TABLET | Freq: Two times a day (BID) | ORAL | 0 refills | Status: DC

## 2022-05-11 NOTE — ED Notes (Signed)
Late Note: RT assessed pt prior to triage for SOB. Pt has pulmonary history and sees Pulmonologist Dew[facility] for his ILD. Pt stated he started feeling very hot and became very short of breath. Pt did not have rescue inhaler with him for relief. RT admin 5/5 at this time. Pt Bilat BS pretreatment clear diminished w/some distress. Pt Bilat BS post treatment much clearer less diminished w/no distress. Pt states he feels a lot better and feels like he can catch his breath. Pts respiratory status stable on RA w/no distress noted at this time w/sats 100%. RT will continue to monitoring.

## 2022-05-11 NOTE — ED Notes (Signed)
Pt educated on the proper use of MDI w/spacer. Pt able to perform w/out difficulty. Pt respiratory status stable w/no distress noted at this time.

## 2022-05-11 NOTE — ED Provider Notes (Addendum)
[facility] EMERGENCY DEPT Provider Note   CSN: [id number] Arrival date & time: 05/10/22  2256     History  Chief Complaint  Patient presents with   Shortness of Breath    Wesley Hancock is a 51 y.o. male.  The history is provided by the patient.  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  No leg pain.  Was given a neb prior to EDP arrival for diminshed BS     Home Medications Prior to Admission medications   Medication Sig Start Date End Date Taking? Authorizing Provider  acetaminophen (TYLENOL) 500 MG tablet Take 1,000 mg by mouth every 6 (six) hours as needed for mild pain.    [provider]  albuterol (VENTOLIN HFA) 108 (90 Base) MCG/ACT inhaler Inhale 2 puffs into the lungs every 6 (six) hours as needed for wheezing or shortness of breath. 04/22/21   Freddi Starr, MD  atorvastatin (LIPITOR) 40 MG tablet Take 1 tablet (40 mg total) by mouth daily. 02/05/22   Vivi Barrack, MD  buPROPion (WELLBUTRIN) 75 MG tablet Take 1 tablet (75 mg total) by mouth 2 (two) times daily. 04/15/22   Inda Coke, PA  escitalopram (LEXAPRO) 20 MG tablet Take 1 tablet (20 mg total) by mouth daily. 02/05/22   Vivi Barrack, MD  fluticasone [facility]) 50 MCG/ACT nasal spray Place 1 spray into both nostrils daily as needed for allergies.    [provider]  Multiple Vitamin (MULTIVITAMIN) tablet Take by mouth daily. 2 chews daily    [provider]  mycophenolate  (CELLCEPT) 500 MG tablet Take 2 tablets (1,000 mg total) by mouth 2 (two) times daily. 02/05/22   Vivi Barrack, MD  pantoprazole (PROTONIX) 40 MG tablet Take 1 tablet (40 mg total) by mouth daily. 02/05/22   Vivi Barrack, MD  telmisartan-hydrochlorothiazide (MICARDIS HCT) 80-25 MG tablet Take 1 tablet by mouth daily. 03/19/22   Inda Coke, PA  warfarin (COUMADIN) 5 MG tablet TAKE 2 TABLETS DAILY BY MOUTH EXCEPT TAKE 3 TABLETS ON MONDAYS, WEDNESDAYS AND FRIDAYS OR AS DIRECTED BY ANTICOAGULATION CLINIC. 04/29/22   Inda Coke, PA      Allergies    Diltiazem hcl    Review of Systems   Review of Systems  Constitutional:  Negative for fever.  HENT:  Negative for facial swelling and sore throat.   Eyes:  Negative for redness.  Respiratory:  Positive for shortness of breath. Negative for sputum production, wheezing and stridor.   Cardiovascular:  Negative for chest pain.  Gastrointestinal:  Negative for abdominal pain.  Neurological:  Negative for facial asymmetry.  All other systems reviewed and are negative.   Physical Exam Updated Vital Signs BP 124/76   Pulse 81   Temp 98 F (36.7 C) (Oral)   Resp 15   Ht '6\' 1"'$  (1.854 m)   Wt 136.1 kg   SpO2 99%   BMI 39.58 kg/m  Physical Exam Vitals and nursing note reviewed.  Constitutional:  General: He is not in acute distress.    Appearance: He is well-developed.  HENT:     Head: Normocephalic and atraumatic.     Nose: Nose normal.     Mouth/Throat:     Mouth: Mucous membranes are moist.  Eyes:     Conjunctiva/sclera: Conjunctivae normal.     Pupils: Pupils are equal, round, and reactive to light.     Comments: Normal appearance  Cardiovascular:     Rate and Rhythm: Normal rate and regular rhythm.     Pulses: Normal pulses.     Heart sounds: Normal heart sounds.  Pulmonary:     Effort: Pulmonary effort is normal. No respiratory distress.     Breath sounds: Normal breath sounds.  Abdominal:     General: Bowel  sounds are normal. There is no distension.     Palpations: Abdomen is soft. There is no mass.     Tenderness: There is no abdominal tenderness. There is no guarding or rebound.  Genitourinary:    Comments: No CVA tenderness Musculoskeletal:        General: Normal range of motion.     Cervical back: Normal range of motion.  Skin:    General: Skin is warm and dry.     Capillary Refill: Capillary refill takes less than 2 seconds.     Findings: No rash.  Neurological:     General: No focal deficit present.     Mental Status: He is alert and oriented to person, place, and time.  Psychiatric:        Mood and Affect: Mood normal.        Behavior: Behavior normal.     ED Results / Procedures / Treatments   Labs (all labs ordered are listed, but only abnormal results are displayed) Results for orders placed or performed during the hospital encounter of 05/11/22  SARS Coronavirus 2 by RT PCR (hospital order, performed in [facility] hospital lab) *cepheid single result test* Anterior Nasal Swab   Specimen: Anterior Nasal Swab  Result Value Ref Range   SARS Coronavirus 2 by RT PCR NEGATIVE NEGATIVE  CBC with Differential  Result Value Ref Range   WBC 9.5 4.0 - 10.5 K/uL   RBC 5.22 4.22 - 5.81 MIL/uL   Hemoglobin 15.3 13.0 - 17.0 g/dL   HCT 46.5 39.0 - 52.0 %   MCV 89.1 80.0 - 100.0 fL   MCH 29.3 26.0 - 34.0 pg   MCHC 32.9 30.0 - 36.0 g/dL   RDW 13.2 11.5 - 15.5 %   Platelets 263 150 - 400 K/uL   nRBC 0.0 0.0 - 0.2 %   Neutrophils Relative % 73 %   Neutro Abs 6.9 1.7 - 7.7 K/uL   Lymphocytes Relative 19 %   Lymphs Abs 1.8 0.7 - 4.0 K/uL   Monocytes Relative 7 %   Monocytes Absolute 0.6 0.1 - 1.0 K/uL   Eosinophils Relative 1 %   Eosinophils Absolute 0.1 0.0 - 0.5 K/uL   Basophils Relative 0 %   Basophils Absolute 0.0 0.0 - 0.1 K/uL   Immature Granulocytes 0 %   Abs Immature Granulocytes 0.03 0.00 - 0.07 K/uL  Basic metabolic panel  Result Value Ref Range   Sodium 138 135 -  145 mmol/L   Potassium 3.3 (L) 3.5 - 5.1 mmol/L   Chloride 97 (L) 98 - 111 mmol/L   CO2 27 22 - 32 mmol/L   Glucose, Bld 108 (H) 70 - 99 mg/dL  BUN 14 6 - 20 mg/dL   Creatinine, Ser 1.09 0.61 - 1.24 mg/dL   Calcium 10.3 8.9 - 10.3 mg/dL   GFR, Estimated >60 >60 mL/min   Anion gap 14 5 - 15  Protime-INR  Result Value Ref Range   Prothrombin Time 26.2 (H) 11.4 - 15.2 seconds   INR 2.4 (H) 0.8 - 1.2  Troponin I (High Sensitivity)  Result Value Ref Range   Troponin I (High Sensitivity) 4 <18 ng/L  Troponin I (High Sensitivity)  Result Value Ref Range   Troponin I (High Sensitivity) 4 <18 ng/L   CT Angio Chest PE W and/or Wo Contrast  Result Date: 05/11/2022 CLINICAL DATA:  Chest pain or SOB, pleurisy or effusion suspected EXAM: CT ANGIOGRAPHY CHEST WITH CONTRAST TECHNIQUE: Multidetector CT imaging of the chest was performed using the standard protocol during bolus administration of intravenous contrast. Multiplanar CT image reconstructions and MIPs were obtained to evaluate the vascular anatomy. RADIATION DOSE REDUCTION: This exam was performed according to the departmental dose-optimization program which includes automated exposure control, adjustment of the mA and/or kV according to patient size and/or use of iterative reconstruction technique. CONTRAST:  32m OMNIPAQUE IOHEXOL 350 MG/ML SOLN COMPARISON:  10/30/2021 FINDINGS: Cardiovascular: No filling defects in the pulmonary arteries to suggest pulmonary emboli. Heart is normal size. Aorta is normal caliber. Coronary artery calcifications. Mediastinum/Nodes: No mediastinal, hilar, or axillary adenopathy. Trachea and esophagus are unremarkable. Thyroid unremarkable. Lungs/Pleura: Patchy nodular airspace disease throughout the right lung concerning for pneumonia. Ground-glass airspace disease in the posterior lingula and left lower lobe also may reflect pneumonia. No effusions. Upper Abdomen: Diffuse fatty infiltration of the liver.  Musculoskeletal: Chest wall soft tissues are unremarkable. No acute bony abnormality. Review of the MIP images confirms the above findings. IMPRESSION: No evidence of pulmonary embolus. Patchy nodular airspace disease throughout the right lung and ground-glass opacities in the left lower lung concerning for pneumonia. Coronary artery disease. Aortic atherosclerosis. Electronically Signed   By: KRolm BaptiseM.D.   On: 05/11/2022 01:59     EKG  EKG Interpretation  Date/Time:    Ventricular Rate:    PR Interval:    QRS Duration:   QT Interval:    QTC Calculation:   R Axis:     Text Interpretation:           Radiology CT Angio Chest PE W and/or Wo Contrast  Result Date: 05/11/2022 CLINICAL DATA:  Chest pain or SOB, pleurisy or effusion suspected EXAM: CT ANGIOGRAPHY CHEST WITH CONTRAST TECHNIQUE: Multidetector CT imaging of the chest was performed using the standard protocol during bolus administration of intravenous contrast. Multiplanar CT image reconstructions and MIPs were obtained to evaluate the vascular anatomy. RADIATION DOSE REDUCTION: This exam was performed according to the departmental dose-optimization program which includes automated exposure control, adjustment of the mA and/or kV according to patient size and/or use of iterative reconstruction technique. CONTRAST:  675mOMNIPAQUE IOHEXOL 350 MG/ML SOLN COMPARISON:  10/30/2021 FINDINGS: Cardiovascular: No filling defects in the pulmonary arteries to suggest pulmonary emboli. Heart is normal size. Aorta is normal caliber. Coronary artery calcifications. Mediastinum/Nodes: No mediastinal, hilar, or axillary adenopathy. Trachea and esophagus are unremarkable. Thyroid unremarkable. Lungs/Pleura: Patchy nodular airspace disease throughout the right lung concerning for pneumonia. Ground-glass airspace disease in the posterior lingula and left lower lobe also may reflect pneumonia. No effusions. Upper Abdomen: Diffuse fatty infiltration of  the liver. Musculoskeletal: Chest wall soft tissues are unremarkable. No acute bony abnormality. Review of the  MIP images confirms the above findings. IMPRESSION: No evidence of pulmonary embolus. Patchy nodular airspace disease throughout the right lung and ground-glass opacities in the left lower lung concerning for pneumonia. Coronary artery disease. Aortic atherosclerosis. Electronically Signed   By: Rolm Baptise M.D.   On: 05/11/2022 01:59    Procedures Procedures    Medications Ordered in ED Medications  albuterol (VENTOLIN HFA) 108 (90 Base) MCG/ACT inhaler 2 puff (has no administration in time range)  amoxicillin-clavulanate (AUGMENTIN) 875-125 MG per tablet 1 tablet (has no administration in time range)  ipratropium-albuterol (DUONEB) 0.5-2.5 (3) MG/3ML nebulizer solution 3 mL (3 mLs Nebulization Given 05/10/22 2314)  albuterol (PROVENTIL) (2.5 MG/3ML) 0.083% nebulizer solution 2.5 mg (2.5 mg Nebulization Given 05/10/22 2314)  iohexol (OMNIPAQUE) 350 MG/ML injection 80 mL (65 mLs Intravenous Contrast Given 05/11/22 0134)  potassium chloride SA (KLOR-CON M) CR tablet 20 mEq (20 mEq Oral Given 05/11/22 0211)  methylPREDNISolone sodium succinate (SOLU-MEDROL) 125 mg/2 mL injection 125 mg (125 mg Intravenous Given 05/11/22 0211)  aerochamber plus with mask device 1 each (1 each Other Given 05/11/22 0255)  warfarin (COUMADIN) tablet 5 mg (5 mg Oral Given 05/11/22 0244)    ED Course/ Medical Decision Making/ A&P                           Medical Decision Making Amount and/or Complexity of Data Reviewed External Data Reviewed: notes.    Details: previous notes reviewed Labs: ordered.    Details: all labs reviewed:  negative covid, normal white count 9.5, normal hemoglobin 15.3, elevated INR 2.4 on coumadin,  normal troponin x 2 both 4.  Normal sodium 138, normal BUN 14 and creatine 1.09 Radiology: ordered and independent interpretation performed.    Details: No PE on CTA by me  ILD ECG/medicine tests: ordered and independent interpretation performed.    Details: no ischemia, not crossing over  Risk Prescription drug management. 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 Will treat for PNA given CT results.  Patient to follow up with pulmonary and coumadin clinic.  Strict return precautions given.      Final Clinical Impression(s) / ED Diagnoses Final diagnoses:  None   Return for intractable cough, coughing up blood, fevers > 100.4 unrelieved by medication, shortness of breath, intractable vomiting, chest pain, shortness of breath, weakness, numbness, changes in speech, facial asymmetry, abdominal pain, passing out, Inability to tolerate liquids or food, cough, altered mental status or any concerns. No signs of systemic illness or infection. The patient is nontoxic-appearing on exam and vital signs are within normal limits.  I have reviewed the triage vital signs and the nursing notes. Pertinent labs & imaging results that were available during my care of the patient were reviewed by me and considered in my medical decision making (see chart for details). After history, exam, and medical workup I feel the patient has been appropriately medically screened and is safe for discharge home. Pertinent diagnoses were discussed with the patient. Patient was given return precautions Rx / DC Orders ED Discharge Orders     None         Daily Doe, MD 05/11/22 0354    Yuepheng Schaller, MD 05/11/22 [id number]

## 2022-05-11 NOTE — ED Notes (Signed)
Pt verbalizes understanding of discharge instructions. Opportunity for questioning and answers were provided. Pt discharged from ED to home.   ? ?

## 2022-05-13 ENCOUNTER — Ambulatory Visit (INDEPENDENT_AMBULATORY_CARE_PROVIDER_SITE_OTHER): Payer: BC Managed Care – PPO

## 2022-05-13 DIAGNOSIS — Z7901 Long term (current) use of anticoagulants: Secondary | ICD-10-CM | POA: Diagnosis not present

## 2022-05-13 LAB — POCT INR: INR: 3.9 — AB (ref 2.0–3.0)

## 2022-05-13 NOTE — Patient Instructions (Addendum)
Pre visit review using our clinic review tool, if applicable. No additional management support is needed unless otherwise documented below in the visit note.  Hold dose today and then change weekly dose to take 2 tablets daily except take 1 tablet on Sunday. Recheck in 1 week. Monitor for signs and symptoms of bleeding or abnormal bruising and if those occur to contact the office.

## 2022-05-13 NOTE — Progress Notes (Signed)
Pt diagnosed with pneumonia in ER on 6/12 and prescribed Augmentin x 10 days. He was also given a steroid injection in ER and given 5 mg warfarin due to INR 2.4 Pt has not started abx yet but will start today. Due to interaction with warfarin, adjustments to dosing will be made for the next week. Pt has apt to recheck INR in 1 week. Advised pt to monitor for signs and symptoms of bleeding or abnormal bruising and if those occur to contact the office. Pt verbalized understanding.  Hold dose today and then change weekly dose to take 2 tablets daily except take 1 tablet on Sunday. Recheck in 1 week.

## 2022-05-14 ENCOUNTER — Ambulatory Visit (INDEPENDENT_AMBULATORY_CARE_PROVIDER_SITE_OTHER): Payer: BC Managed Care – PPO | Admitting: Nurse Practitioner

## 2022-05-14 ENCOUNTER — Encounter: Payer: Self-pay | Admitting: Nurse Practitioner

## 2022-05-14 VITALS — BP 142/78 | HR 84 | Temp 98.2°F | Ht 73.0 in | Wt 310.6 lb

## 2022-05-14 DIAGNOSIS — J84116 Cryptogenic organizing pneumonia: Secondary | ICD-10-CM | POA: Diagnosis not present

## 2022-05-14 DIAGNOSIS — J189 Pneumonia, unspecified organism: Secondary | ICD-10-CM | POA: Diagnosis not present

## 2022-05-14 DIAGNOSIS — J849 Interstitial pulmonary disease, unspecified: Secondary | ICD-10-CM

## 2022-05-14 LAB — CBC WITH DIFFERENTIAL/PLATELET
Basophils Absolute: 0.1 10*3/uL (ref 0.0–0.1)
Basophils Relative: 0.7 % (ref 0.0–3.0)
Eosinophils Absolute: 0.3 10*3/uL (ref 0.0–0.7)
Eosinophils Relative: 3.4 % (ref 0.0–5.0)
HCT: 47 % (ref 39.0–52.0)
Hemoglobin: 15.9 g/dL (ref 13.0–17.0)
Lymphocytes Relative: 19.1 % (ref 12.0–46.0)
Lymphs Abs: 1.5 10*3/uL (ref 0.7–4.0)
MCHC: 33.9 g/dL (ref 30.0–36.0)
MCV: 88 fl (ref 78.0–100.0)
Monocytes Absolute: 0.7 10*3/uL (ref 0.1–1.0)
Monocytes Relative: 8.7 % (ref 3.0–12.0)
Neutro Abs: 5.4 10*3/uL (ref 1.4–7.7)
Neutrophils Relative %: 68.1 % (ref 43.0–77.0)
Platelets: 277 10*3/uL (ref 150.0–400.0)
RBC: 5.34 Mil/uL (ref 4.22–5.81)
RDW: 13.9 % (ref 11.5–15.5)
WBC: 7.9 10*3/uL (ref 4.0–10.5)

## 2022-05-14 LAB — C-REACTIVE PROTEIN: CRP: <1 mg/dL (ref 0.5–20.0)

## 2022-05-14 LAB — SEDIMENTATION RATE: Sed Rate: 29 mm/hr — ABNORMAL HIGH (ref 0–20)

## 2022-05-14 MED ORDER — PREDNISONE 10 MG PO TABS: ORAL_TABLET | ORAL | 0 refills | Status: DC

## 2022-05-14 NOTE — Assessment & Plan Note (Signed)
Questionable underlying pna - tx with augmentin course prescribed by ED physician. Upon review of his imaging, similar appearance in 2022. There was improvement on HRCT from September after high dose steroid use so I am concerned this is inflammation vs underlying infection. See above plan.

## 2022-05-14 NOTE — Patient Instructions (Addendum)
Continue Albuterol inhaler 2 puffs or 3 mL neb every 6 hours as needed for shortness of breath or wheezing. Notify if symptoms persist despite rescue inhaler/neb use. Continue CellCept 2 tabs Twice daily   Complete augmentin course as previously prescribed. Take with food Prednisone taper. 4 tabs for 3 days, then 3 tabs for 3 days, 2 tabs for 3 days, then 1 tab for 3 days, then stop. Take in AM with food   Labs today - CBC with diff, ESR, CRP  Follow up next week with Dr. Erin Fulling. If symptoms do not improve or worsen, please contact office for sooner follow up or seek emergency care.

## 2022-05-14 NOTE — Progress Notes (Signed)
'@Patient'  ID: Wesley Hancock, male    DOB: Nov 14, 1971, 51 y.o.   MRN: [id number]  Chief Complaint  Patient presents with   Follow-up    Patient is here to talk about pneumonia and his breathing.     Referring provider: Inda Coke, PA  HPI: 51 year old male, former smoker followed for ILD related to mixed connective tissue disease and OSA on CPAP. He is a patient of Dr. August Albino and last seen in office on 05/27/2021. Past medical history significant for PE/DVT related to antiphospholipid syndrome, SLE, mixed connective tissue disease, HTN, CVA, goiter, CKD, HLD, depression, anxiety.   He has a history of DVT/PE due to antiphospholipid syndrome diagnosed in September 2020 and later with SLE and mixed connective tissue disease with ILD features in February 2022. He was treated with high dose steroids last year and eventually tapered off in July 2022 after starting on CellCept.   TEST/EVENTS:   05/27/2021: OV with Dr. Erin Fulling. Previously tapered to 30 mg prednisone daily in April 2022 and increased CellCept to 1 g bid. Able to taper prednisone down to 10 mg daily with plans to decrease to 5 mg over the weekend and then stopping in July. Respiratory symptoms improved and activity tolerance improved. Tolerating CellCept well-check labs, which were stable. Repeat HRCT in early September.   05/14/2022: Today - acute Patient presents today for hospital follow up. He has been having progressive DOE over the past few months then on Monday, he was at work and began feeling very flushed, had chest tightness and worsening shortness of breath. He had to go into the air conditioned room to catch his breath and decided he needed further evaluation in the ED. He was evaluated and workup negative for PE or ACS. His CTA showed bilateral patchy opacities and he was started on Augmentin 10 day course. He was also given a depo shot. He began feeling better prior to starting his antibiotics with use of albuterol  nebs, which he is using 3-4 times a day as previously directed. He was unsure if he was going to start them but decided to yesterday and has taken 3 doses. He still has increased SOB from baseline but not as severe as he was Monday. He has an occasional cough, which is non-productive but feels somewhat congested. He doesn't ever notice any wheezing but his family occasionally tells him he sounds wheezy. He denies any fevers, hemoptysis, URI symptoms, night sweats, leg swelling. He has had similar symptoms in the past, which was later found to be ILD, and treated with high dose steroids with good success. He continues on CellCept Twice daily, has not had any difficulties but is unsure if it is still working as well.   Allergies  Allergen Reactions   Diltiazem Hcl Diarrhea and Other (See Comments)    Lethargic     Immunization History  Administered Date(s) Administered   Influenza Inj Mdck Quad Pf 12/13/2020   Influenza,inj,Quad PF,6+ Mos 09/14/2016, 09/08/2017, 09/06/2019, 10/07/2021   Influenza,inj,quad, With Preservative 09/18/2015   PFIZER(Purple Top)SARS-COV-2 Vaccination 02/19/2020, 03/04/2020, 12/13/2020, 07/03/2021   PNEUMOCOCCAL CONJUGATE-20 04/27/2022   Td 11/11/2016   Tdap 02/28/2019   Zoster Recombinat (Shingrix) 04/27/2022    Past Medical History:  Diagnosis Date   Acute nonintractable headache 09/06/2019   Acute respiratory failure with hypoxia ([name]) 08/30/2019   AKI (acute kidney injury) ([name]) 08/30/2019   Antiphospholipid syndrome ([name]) 10/30/2019   Anxiety    APS (antiphospholipid syndrome) ([name])    Arthritis  Colon polyp    ? hyperplastic   Depression    Diarrhea 05/21/2020   DVT (deep venous thrombosis) ([name])    Dyspnea on exertion 09/08/2017   "Quit smoking" 2015 with onset of symptoms in 2016  Spirometry 09/08/2017  Flat f/v loop  - d/c acei 09/08/2017  - 01/04/2020   Walked RA x two laps =  approx 57f @ fast pace - stopped due to end of study/ min sob with  sats of 93 % at the end of the study. - PFT's  03/08/20   FEV1 3.84 (83 % ) ratio 0..90  p 3 % improvement from saba p ? prior to study with DLCO  26.46 (77%) corrects to 4.76 (99%)     Esophageal spasm    Essential hypertension 09/23/2014   D/c acei 09/08/2017 due to pseudocopd    GAD (generalized anxiety disorder) 08/06/2016   Lobar pneumonia ([place] 08/30/2019   Lung nodule 09/21/2014   Lupus ([place]    Mixed hyperlipidemia 09/18/2015   Mor[place] to excess calories [name] 09/10/2019   Multiple pulmonary emboli (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 06/26/20, 03/08/20   Snoring 07/09/2020   Stress fracture    in back, seeing PT   Stroke ([name]    Upper airway cough syndrome 01/05/2020   Onset mid Jan 2021 while on otc PPI  - max rx for gerd 01/04/2020 >>>      Witnessed apneic spells 07/09/2020    Tobacco History: Social History   Tobacco Use  Smoking Status Former   Packs/day: 1.00   Years: 20.00   Total pack years: 20.00   Types: Cigarettes   Quit date: 11/30/2013   Years since quitting: 8.4  Smokeless Tobacco Former   Types: Snuff   Quit date: 05/21/2018   Counseling given: Not Answered   Outpatient Medications Prior to Visit  Medication Sig Dispense Refill   acetaminophen (TYLENOL) 500 MG tablet Take 1,000 mg by mouth every 6 (six) hours as needed for mild pain.     albuterol (VENTOLIN HFA) 108 (90 Base) MCG/ACT inhaler Inhale 2 puffs into the lungs every 6 (six) hours as needed for wheezing or shortness of breath. 8 g 6    amoxicillin-clavulanate (AUGMENTIN) 875-125 MG tablet Take 1 tablet by mouth every 12 (twelve) hours. 20 tablet 0   atorvastatin (LIPITOR) 40 MG tablet Take 1 tablet (40 mg total) by mouth daily. 30 tablet 1   buPROPion (WELLBUTRIN) 75 MG tablet Take 1 tablet (75 mg total) by mouth 2 (two) times daily. 270 tablet 0   escitalopram (LEXAPRO) 20 MG tablet Take 1 tablet (20 mg total) by mouth daily. 90 tablet 1   fluticasone (FLONASE) 50 MCG/ACT nasal spray Place 1 spray into both nostrils daily as needed for allergies.     Multiple Vitamin (MULTIVITAMIN) tablet Take by mouth daily. 2 chews daily     mycophenolate (CELLCEPT) 500 MG tablet Take 2 tablets (1,000 mg total) by mouth 2 (two) times daily. 360 tablet 3   pantoprazole (  PROTONIX) 40 MG tablet Take 1 tablet (40 mg total) by mouth daily. 90 tablet 3   telmisartan-hydrochlorothiazide (MICARDIS HCT) 80-25 MG tablet Take 1 tablet by mouth daily. 30 tablet 1   warfarin (COUMADIN) 5 MG tablet TAKE 2 TABLETS DAILY BY MOUTH EXCEPT TAKE 3 TABLETS ON MONDAYS, WEDNESDAYS AND FRIDAYS OR AS DIRECTED BY ANTICOAGULATION CLINIC. 200 tablet 1   No facility-administered medications prior to visit.     Review of Systems:   Constitutional: No weight loss or gain, night sweats, fevers, chills, fatigue, or lassitude. HEENT: No headaches, difficulty swallowing, tooth/dental problems, or sore throat. No sneezing, itching, ear ache, nasal congestion, or post nasal drip CV:  No chest pain, orthopnea, PND, swelling in lower extremities, anasarca, dizziness, palpitations, syncope Resp: +shortness of breath with exertion; occasional cough; occasional wheeze; chest tightness (improved). No excess mucus or change in color of mucus. No hemoptysis.  No chest wall deformity GI:  No heartburn, indigestion, abdominal pain, nausea, vomiting, diarrhea, change in bowel habits, loss of appetite, bloody stools.  Skin: No rash, lesions, ulcerations MSK:  No joint pain or swelling.   No decreased range of motion.  No back pain. Neuro: No dizziness or lightheadedness.  Psych: No depression or anxiety. Mood stable.     Physical Exam:  BP (!) 142/78 (BP Location: Right Arm, Patient Position: Sitting, Cuff Size: Large)   Pulse 84   Temp 98.2 F (36.8 C) (Oral)   Ht '6\' 1"'  (1.854 m)   Wt (!) 310 lb 9.6 oz (140.9 kg)   SpO2 96%   BMI 40.98 kg/m   GEN: Pleasant, interactive, well-appearing; obese; in no acute distress. HEENT:  Normocephalic and atraumatic. PERRLA. Sclera white. Nasal turbinates pink, moist and patent bilaterally. No rhinorrhea present. Oropharynx pink and moist, without exudate or edema. No lesions, ulcerations, or postnasal drip.  NECK:  Supple w/ fair ROM. No JVD present. Normal carotid impulses w/o bruits. Thyroid symmetrical with no goiter or nodules palpated. No lymphadenopathy.   CV: RRR, no m/r/g, no peripheral edema. Pulses intact, +2 bilaterally. No cyanosis, pallor or clubbing. PULMONARY:  Unlabored, regular breathing. Bibasilar crackles otherwise clear bilaterally A&P w/o wheezes/rales/rhonchi. No accessory muscle use. No dullness to percussion. GI: BS present and normoactive. Soft, non-tender to palpation. No organomegaly or masses detected. No CVA tenderness. MSK: No erythema, warmth or tenderness. Cap refil <2 sec all extrem. No deformities or joint swelling noted.  Neuro: A/Ox3. No focal deficits noted.   Skin: Warm, no lesions or rashe Psych: Normal affect and behavior. Judgement and thought content appropriate.     Lab Results:  CBC    Component Value Date/Time   WBC 7.9 05/14/2022 1053   RBC 5.34 05/14/2022 1053   HGB 15.9 05/14/2022 1053   HGB 14.6 04/18/2021 0817   HCT 47.0 05/14/2022 1053   PLT 277.0 05/14/2022 1053   PLT 254 04/18/2021 0817   MCV 88.0 05/14/2022 1053   MCH 29.3 05/11/2022 0019   MCHC 33.9 05/14/2022 1053   RDW 13.9 05/14/2022 1053   LYMPHSABS 1.5 05/14/2022 1053   MONOABS 0.7 05/14/2022 1053   EOSABS  0.3 05/14/2022 1053   BASOSABS 0.1 05/14/2022 1053    BMET    Component Value Date/Time   NA 138 05/11/2022 0019   NA 136 10/14/2020 0823   K 3.3 (L) 05/11/2022 0019   CL 97 (L) 05/11/2022 0019   CO2 27 05/11/2022 0019   GLUCOSE 108 (H) 05/11/2022 0019   BUN 14 05/11/2022 0019  BUN 19 10/14/2020 0823   CREATININE 1.09 05/11/2022 0019   CREATININE 1.20 04/18/2021 0817   CALCIUM 10.3 05/11/2022 0019   GFRNONAA >60 05/11/2022 0019   GFRNONAA >60 04/18/2021 0817   GFRAA 84 10/14/2020 0823   GFRAA >60 07/15/2020 0801    BNP    Component Value Date/Time   BNP 29.0 01/13/2021 1235     Imaging:  CT Angio Chest PE W and/or Wo Contrast  Result Date: 05/11/2022 CLINICAL DATA:  Chest pain or SOB, pleurisy or effusion suspected EXAM: CT ANGIOGRAPHY CHEST WITH CONTRAST TECHNIQUE: Multidetector CT imaging of the chest was performed using the standard protocol during bolus administration of intravenous contrast. Multiplanar CT image reconstructions and MIPs were obtained to evaluate the vascular anatomy. RADIATION DOSE REDUCTION: This exam was performed according to the departmental dose-optimization program which includes automated exposure control, adjustment of the mA and/or kV according to patient size and/or use of iterative reconstruction technique. CONTRAST:  12m OMNIPAQUE IOHEXOL 350 MG/ML SOLN COMPARISON:  10/30/2021 FINDINGS: Cardiovascular: No filling defects in the pulmonary arteries to suggest pulmonary emboli. Heart is normal size. Aorta is normal caliber. Coronary artery calcifications. Mediastinum/Nodes: No mediastinal, hilar, or axillary adenopathy. Trachea and esophagus are unremarkable. Thyroid unremarkable. Lungs/Pleura: Patchy nodular airspace disease throughout the right lung concerning for pneumonia. Ground-glass airspace disease in the posterior lingula and left lower lobe also may reflect pneumonia. No effusions. Upper Abdomen: Diffuse fatty infiltration of the liver.  Musculoskeletal: Chest wall soft tissues are unremarkable. No acute bony abnormality. Review of the MIP images confirms the above findings. IMPRESSION: No evidence of pulmonary embolus. Patchy nodular airspace disease throughout the right lung and ground-glass opacities in the left lower lung concerning for pneumonia. Coronary artery disease. Aortic atherosclerosis. Electronically Signed   By: KRolm BaptiseM.D.   On: 05/11/2022 01:59         Latest Ref Rng & Units 03/25/2021    8:59 AM 03/08/2020    2:51 PM  PFT Results  FVC-Pre L 4.17  4.31   FVC-Predicted Pre % 70  72   FVC-Post L 4.02  4.29   FVC-Predicted Post % 68  72   Pre FEV1/FVC % % 88  86   Post FEV1/FCV % % 91  90   FEV1-Pre L 3.66  3.71   FEV1-Predicted Pre % 79  80   FEV1-Post L 3.67  3.84   DLCO uncorrected ml/min/mmHg 26.04  26.46   DLCO UNC% % 76  77   DLCO corrected ml/min/mmHg 25.42  26.46   DLCO COR %Predicted % 74  77   DLVA Predicted % 96  99   TLC L 6.11  5.98   TLC % Predicted % 77  76   RV % Predicted % 74  67     No results found for: "NITRICOXIDE"      Assessment & Plan:   ILD (interstitial lung disease) ([place] Progressive DOE with recent acute flare. He had no infectious symptoms and WBC nl. I am concerned that his symptoms were more so ILD exacerbation and his CT findings were inflammation vs infectious process. We will recheck CBC today as well as ESR and CRP for inflammatory markers. Recommended he continue augmentin as unable to entirely rule out superimposed infection. Advised that we treat with prednisone taper as well. Continue PRN saba. Continue CellCept 1 g Twice daily. Close follow up.   Given progression of disease, recommend repeat HRCT and spiro/DLCO. Discuss timing with Dr. DErin Fullingat follow  up.   Patient Instructions  Continue Albuterol inhaler 2 puffs or 3 mL neb every 6 hours as needed for shortness of breath or wheezing. Notify if symptoms persist despite rescue inhaler/neb  use. Continue CellCept 2 tabs Twice daily   Complete augmentin course as previously prescribed. Take with food Prednisone taper. 4 tabs for 3 days, then 3 tabs for 3 days, 2 tabs for 3 days, then 1 tab for 3 days, then stop. Take in AM with food   Labs today - CBC with diff, ESR, CRP  Follow up next week with Dr. Erin Fulling. If symptoms do not improve or worsen, please contact office for sooner follow up or seek emergency care.    Multifocal pneumonia Questionable underlying pna - tx with augmentin course prescribed by ED physician. Upon review of his imaging, similar appearance in 2022. There was improvement on HRCT from September after high dose steroid use so I am concerned this is inflammation vs underlying infection. See above plan.    I spent 35 minutes of dedicated to the care of this patient on the date of this encounter to include pre-visit review of records, face-to-face time with the patient discussing conditions above, post visit ordering of testing, clinical documentation with the electronic health record, making appropriate referrals as documented, and communicating necessary findings to members of the patients care team.  Clayton Bibles, NP 05/14/2022  Pt aware and understands NP's role.

## 2022-05-14 NOTE — Assessment & Plan Note (Signed)
Progressive DOE with recent acute flare. He had no infectious symptoms and WBC nl. I am concerned that his symptoms were more so ILD exacerbation and his CT findings were inflammation vs infectious process. We will recheck CBC today as well as ESR and CRP for inflammatory markers. Recommended he continue augmentin as unable to entirely rule out superimposed infection. Advised that we treat with prednisone taper as well. Continue PRN saba. Continue CellCept 1 g Twice daily. Close follow up.   Given progression of disease, recommend repeat HRCT and spiro/DLCO. Discuss timing with Dr. Erin Fulling at follow up.   Patient Instructions  Continue Albuterol inhaler 2 puffs or 3 mL neb every 6 hours as needed for shortness of breath or wheezing. Notify if symptoms persist despite rescue inhaler/neb use. Continue CellCept 2 tabs Twice daily   Complete augmentin course as previously prescribed. Take with food Prednisone taper. 4 tabs for 3 days, then 3 tabs for 3 days, 2 tabs for 3 days, then 1 tab for 3 days, then stop. Take in AM with food   Labs today - CBC with diff, ESR, CRP  Follow up next week with Dr. Erin Fulling. If symptoms do not improve or worsen, please contact office for sooner follow up or seek emergency care.

## 2022-05-15 NOTE — Progress Notes (Signed)
Please notify patient that his labs from yesterday came back. Sedimentation rate was slightly elevated but unchanged from a year ago. His CRP (other inflammatory marker) was normal, which is good news. Have him continue with our plan as discussed yesterday and follow up with Dr. Erin Fulling next week. Thanks.

## 2022-05-20 ENCOUNTER — Ambulatory Visit: Payer: BC Managed Care – PPO

## 2022-05-20 ENCOUNTER — Ambulatory Visit (INDEPENDENT_AMBULATORY_CARE_PROVIDER_SITE_OTHER): Payer: BC Managed Care – PPO

## 2022-05-20 DIAGNOSIS — Z7901 Long term (current) use of anticoagulants: Secondary | ICD-10-CM

## 2022-05-20 LAB — POCT INR: INR: 1.5 — AB (ref 2.0–3.0)

## 2022-05-20 NOTE — Patient Instructions (Addendum)
Pre visit review using our clinic review tool, if applicable. No additional management support is needed unless otherwise documented below in the visit note.  Increase dose today to take 4 tablets and increase dose tomorrow to take 3 tablets and then take 2 tablets daily except take 3 tablets on Mondays, Wednesdays and Fridays. Recheck in 3 weeks per pt request.

## 2022-05-20 NOTE — Progress Notes (Signed)
Pt diagnosed with pneumonia in ER on 6/12 and prescribed Augmentin x 10 days. He was also given a steroid injection in ER and given 5 mg warfarin due to INR 2.4 Pt has started Augmentin on 6/15 x 10 days and has 2 days left to take. Also taking prednisone taper and has 4 more days of this.   Increase dose today to take 4 tablets and increase dose tomorrow to take 3 tablets and then take 2 tablets daily except take 3 tablets on Mondays, Wednesdays and Fridays. Recheck in 3 weeks per pt request.

## 2022-05-22 ENCOUNTER — Ambulatory Visit: Payer: BC Managed Care – PPO | Admitting: Pulmonary Disease

## 2022-05-22 NOTE — Progress Notes (Deleted)
Synopsis: Pulmonary follow up for ILD diagnosis in 12/2020  Subjective:   PATIENT ID: Wesley Hancock GENDER: male DOB: [birth year], MRN: [id number]  HPI  No chief complaint on file.  Wesley Hancock is a 51 year old male, former smoker with history of DVT/PE due to antiphospholipid syndrome diagnosed in September 2020 and later with SLE and mixed connective tissue disease with interstitial lung disease features in February 2022.   He continues on 1g cellcept twice daily along with hydroxychloroquine for the mixed connective tissue disease and lupus. He has tapered down to '10mg'$  of prednisone daily without any increase in dyspnea. He has noticed an increase in intermittent back pain though. He had an episode of acute dyspnea over the last weekend with SpO2 into the low 90s. He rested for a couple days and then the episode resolved spontaneously. He denies any cough or wheezing. No skin rashes.   OV 04/22/21 He was started on steroid taper in February, initially on '60mg'$  daily with bactrim prophylaxis. He was tapered to '40mg'$  daily on 01/30/21 and started on mycophenolate '500mg'$  twice daily at that time. The mycophenolate was increased to 1g twice daily on 03/26/21. He has tolerated the mycophenolate well and his lab values remain in normal range. He is currently on '30mg'$  of prednisone daily and bactrim prophylaxis. He reports noticing some increased shortness of breath over the past 3-4 days. Denies fevers or chills.   He received his CPAP machine at the beginning of the month and has noticed improvement in his day time fatigue. He has been gaining weight since being on the steroid taper.  He is followed by Dr. [name] in rheumatology and [name], NP in hematology. I have reviewed there notes from 5/19 and 5/20 respectively.  Past Medical History:  Diagnosis Date   Acute nonintractable headache 09/06/2019   Acute respiratory failure with hypoxia ([place]) 08/30/2019   AKI (acute kidney  injury) ([place]) 08/30/2019   Antiphospholipid syndrome ([place]) 10/30/2019   Anxiety    APS (antiphospholipid syndrome) ([place])    Arthritis    Colon polyp    ? hyperplastic   Depression    Diarrhea 05/21/2020   DVT (deep venous thrombosis) ([name])    Dyspnea on exertion 09/08/2017   "Quit smoking" 2015 with onset of symptoms in 2016  Spirometry 09/08/2017  Flat f/v loop  - d/c acei 09/08/2017  - 01/04/2020   Walked RA x two laps =  approx 574f @ fast pace - stopped due to end of study/ min sob with sats of 93 % at the end of the study. - PFT's  03/08/20   FEV1 3.84 (83 % ) ratio 0..90  p 3 % improvement from saba p ? 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 06/26/20, 03/08/20   Snoring 07/09/2020   Stress fracture    in back, seeing PT   Stroke ([place])    Upper airway cough syndrome 01/05/2020   Onset mid Jan 2021 while on otc PPI  - max rx for gerd 01/04/2020 >>>      Witnessed apneic spells 07/09/2020     Family History  Problem Relation Age of Onset   Diabetes Mother    Hyperlipidemia Mother    Hypertension Mother    Thyroid disease Mother        uncertain  type--had surg--no cancer   Asthma Mother        died from PNA   Heart disease Father 67   Hyperlipidemia Father    Hypertension Father    Prostate cancer Father 19   Lung cancer Father 39       Dx 06/18/2017   Colon polyps Father    Irritable bowel syndrome Father    Diverticulitis Father    Diabetes Sister    Hyperlipidemia Sister    Hypertension Sister    Diabetes Maternal Grandmother    Heart disease Maternal Grandmother    Hyperlipidemia Maternal Grandmother    Hypertension Maternal Grandmother    Colon cancer Maternal Grandmother    Heart disease Maternal Grandfather    Hyperlipidemia Maternal Grandfather    Hypertension Maternal Grandfather    Stroke Maternal Grandfather    Liver cancer Maternal Grandfather    Irritable bowel syndrome Maternal Grandfather    Heart disease Paternal Conservation officer, historic buildings Daughter    Healthy Daughter    Healthy Son      Social History   Socioeconomic History   Marital status: Married    Spouse name: Lauren   Number of children: 3   Years of education: Not on file   Highest education level: Not on file  Occupational History   Occupation: Truck driver  Tobacco Use   Smoking status: Former    Packs/day: 1.00    Years: 20.00    Total pack years: 20.00    Types: Cigarettes    Quit date: 11/30/2013    Years since quitting: 8.4   Smokeless tobacco: Former    Types: Snuff    Quit date: 05/21/2018  Vaping Use   Vaping Use: Never used  Substance and Sexual Activity   Alcohol use: Not Currently   Drug use: No   Sexual activity: Yes  Other Topics Concern   Not on file  Social History Narrative   Lives with wife and daughter at home   Right Handed   Drinks about 2 cups caffeine daily   Social Determinants of Health   Financial Resource Strain: Not on file  Food Insecurity: Not on file  Transportation Needs: Not on file  Physical Activity: Not on file  Stress: Not on file  Social Connections: Not on file  Intimate Partner  Violence: Not on file     Allergies  Allergen Reactions   Diltiazem Hcl Diarrhea and Other (See Comments)    Lethargic      Outpatient Medications Prior to Visit  Medication Sig Dispense Refill   acetaminophen (TYLENOL) 500 MG tablet Take 1,000 mg by mouth every 6 (six) hours as needed for mild pain.     albuterol (VENTOLIN HFA) 108 (90 Base) MCG/ACT inhaler Inhale 2  puffs into the lungs every 6 (six) hours as needed for wheezing or shortness of breath. 8 g 6   amoxicillin-clavulanate (AUGMENTIN) 875-125 MG tablet Take 1 tablet by mouth every 12 (twelve) hours. 20 tablet 0   atorvastatin (LIPITOR) 40 MG tablet Take 1 tablet (40 mg total) by mouth daily. 30 tablet 1   buPROPion (WELLBUTRIN) 75 MG tablet Take 1 tablet (75 mg total) by mouth 2 (two) times daily. 270 tablet 0   escitalopram (LEXAPRO) 20 MG tablet Take 1 tablet (20 mg total) by mouth daily. 90 tablet 1   fluticasone (FLONASE) 50 MCG/ACT nasal spray Place 1 spray into both nostrils daily as needed for allergies.     Multiple Vitamin (MULTIVITAMIN) tablet Take by mouth daily. 2 chews daily     mycophenolate (CELLCEPT) 500 MG tablet Take 2 tablets (1,000 mg total) by mouth 2 (two) times daily. 360 tablet 3   pantoprazole (PROTONIX) 40 MG tablet Take 1 tablet (40 mg total) by mouth daily. 90 tablet 3   predniSONE (DELTASONE) 10 MG tablet 4 tabs for 3 days, then 3 tabs for 3 days, 2 tabs for 3 days, then 1 tab for 3 days, then stop 30 tablet 0   telmisartan-hydrochlorothiazide (MICARDIS HCT) 80-25 MG tablet Take 1 tablet by mouth daily. 30 tablet 1   warfarin (COUMADIN) 5 MG tablet TAKE 2 TABLETS DAILY BY MOUTH EXCEPT TAKE 3 TABLETS ON MONDAYS, WEDNESDAYS AND FRIDAYS OR AS DIRECTED BY ANTICOAGULATION CLINIC. 200 tablet 1   No facility-administered medications prior to visit.    Review of Systems  Constitutional:  Negative for chills, fever, malaise/fatigue and weight loss.  HENT:  Negative for congestion, sinus pain and sore  throat.   Eyes: Negative.   Respiratory:  Negative for cough, hemoptysis, sputum production, shortness of breath and wheezing.   Cardiovascular:  Negative for chest pain, palpitations, orthopnea, claudication and leg swelling.  Gastrointestinal:  Negative for abdominal pain, heartburn, nausea and vomiting.  Genitourinary: Negative.   Musculoskeletal:  Negative for joint pain and myalgias.  Skin:  Negative for rash.  Neurological:  Negative for weakness.  Endo/Heme/Allergies: Negative.   Psychiatric/Behavioral: Negative.      Objective:   There were no vitals filed for this visit.   Physical Exam Constitutional:      General: He is not in acute distress. HENT:     Head: Normocephalic and atraumatic.  Eyes:     Extraocular Movements: Extraocular movements intact.     Conjunctiva/sclera: Conjunctivae normal.     Pupils: Pupils are equal, round, and reactive to light.  Cardiovascular:     Rate and Rhythm: Normal rate and regular rhythm.     Pulses: Normal pulses.     Heart sounds: Normal heart sounds. No murmur heard. Pulmonary:     Breath sounds: Rales (right base) present.  Abdominal:     General: Bowel sounds are normal.     Palpations: Abdomen is soft.  Musculoskeletal:     Right lower leg: No edema.     Left lower leg: No edema.  Lymphadenopathy:     Cervical: No cervical adenopathy.  Skin:    General: Skin is warm and dry.  Neurological:     General: No focal deficit present.     Mental Status: He is alert.  Psychiatric:        Mood and Affect: Mood normal.        Behavior: Behavior normal.        Thought Content:  Thought content normal.        Judgment: Judgment normal.    CBC    Component Value Date/Time   WBC 7.9 05/14/2022 1053   RBC 5.34 05/14/2022 1053   HGB 15.9 05/14/2022 1053   HGB 14.6 04/18/2021 0817   HCT 47.0 05/14/2022 1053   PLT 277.0 05/14/2022 1053   PLT 254 04/18/2021 0817   MCV 88.0 05/14/2022 1053   MCH 29.3 05/11/2022 0019   MCHC  33.9 05/14/2022 1053   RDW 13.9 05/14/2022 1053   LYMPHSABS 1.5 05/14/2022 1053   MONOABS 0.7 05/14/2022 1053   EOSABS 0.3 05/14/2022 1053   BASOSABS 0.1 05/14/2022 1053      Latest Ref Rng & Units 05/11/2022   12:19 AM 10/30/2021    6:19 AM 10/07/2021    3:11 PM  BMP  Glucose 70 - 99 mg/dL 108  119  98   BUN 6 - 20 mg/dL '14  22  18   '$ Creatinine 0.61 - 1.24 mg/dL 1.09  1.11  1.10   Sodium 135 - 145 mmol/L 138  140  138   Potassium 3.5 - 5.1 mmol/L 3.3  3.4  4.6   Chloride 98 - 111 mmol/L 97  104  101   CO2 22 - 32 mmol/L '27  27  28   '$ Calcium 8.9 - 10.3 mg/dL 10.3  10.0  10.1    Labs: 01/13/2021 ANA Positive dsDNA 177 IU/mL (0-9IU/mL) Ribnucleic Protien 2.4 (0- 0.9AI)  01/15/21 MyoMarker 3 Panel is negative  Chest imaging: CTA Chest 01/13/21 1. No demonstrable pulmonary embolus. No thoracic aortic aneurysm or dissection. There are foci of aortic atherosclerosis as well as foci of coronary artery calcification.   2. Extensive airspace opacity bilaterally with overall increased compared to December 2021. Areas of patchy consolidation noted in several areas. The overall appearance is most indicative of atypical organism pneumonia. A degree of bacterial superinfection is question. Note that there has been persistence of multifocal airspace opacity for several months. This circumstance may warrant Pulmonary Medicine consultation with consideration for bronchoscopy for further assessment with respect to etiology for the parenchymal lung lesions.   3. Stable adenopathy of uncertain etiology. Given the parenchymal lung changes, this adenopathy could have reactive etiology.   4. Hepatic steatosis. Liver appears prominent although incompletely visualized. Gallbladder absent.   5. Thyroid nodules with assessment by thyroid ultrasound 6 days prior. Please see recent thyroid ultrasound report with respect to thyroid nodular assessment.  PFT:    Latest Ref Rng & Units 03/25/2021     8:59 AM 03/08/2020    2:51 PM  PFT Results  FVC-Pre L 4.17  4.31   FVC-Predicted Pre % 70  72   FVC-Post L 4.02  4.29   FVC-Predicted Post % 68  72   Pre FEV1/FVC % % 88  86   Post FEV1/FCV % % 91  90   FEV1-Pre L 3.66  3.71   FEV1-Predicted Pre % 79  80   FEV1-Post L 3.67  3.84   DLCO uncorrected ml/min/mmHg 26.04  26.46   DLCO UNC% % 76  77   DLCO corrected ml/min/mmHg 25.42  26.46   DLCO COR %Predicted % 74  77   DLVA Predicted % 96  99   TLC L 6.11  5.98   TLC % Predicted % 77  76   RV % Predicted % 74  67   03/25/21 mild restrictive defect and mild diffusion defect  Echo 07/31/20:  1.  Left ventricular ejection fraction, by estimation, is 60 to 65%. The  left ventricle has normal function. The left ventricle has no regional  wall motion abnormalities. There is mild left ventricular hypertrophy.  Left ventricular diastolic parameters  were normal.   2. Right ventricular systolic function is normal. The right ventricular  size is normal.   3. Left atrial size was mildly dilated.   4. The mitral valve is normal in structure. No evidence of mitral valve  regurgitation. No evidence of mitral stenosis.   5. The aortic valve is normal in structure. Aortic valve regurgitation is  not visualized. No aortic stenosis is present.   6. The inferior vena cava is normal in size with greater than 50%  respiratory variability, suggesting right atrial pressure of 3 mmHg.  NM Myocardial Stress Test 08/01/20 The left ventricular ejection fraction is normal (55-65%). Nuclear stress EF: 59%. There were no wall motion abnormalities There was no ST segment deviation noted during stress. The study is normal. This is a low risk study. No perfusion defects.  Assessment & Plan:   No diagnosis found.  Discussion: Wesley Hancock is a 51 year old male, former smoker with history of DVT/PE due to antiphospholipid syndrome diagnosed in September 2020 who presented with shortness of breath and  chest pain 01/13/21 with workup showing signs of interstitial lung disease related to lupus/mixed connective tissue overlap syndrome.   He was treated with IV solumedrol in the hospital for 2 days and then transitioned to '60mg'$  of prednisone daily along with bactrim prophylaxis which was tapered to '40mg'$  daily on 01/30/21 with initiation of '500mg'$  of mycophenolate. He was then tapered to '30mg'$  daily prednisone on 03/25/21 and an increase in the mycophenolate to 1g BID. His prednisone is currently at '10mg'$  daily and he will be tapering down to '5mg'$  daily this weekend for a few days then will be stopping prednisone in early July. Overall his respiratory symptoms have improved significantly and he is able to be more active. I have encouraged him to work on further weight loss and he is considering joining the [employer].    We will check labs today for cellcept monitoring.   We will repeat a HRCT chest in early September with follow up soon after.   Freda Jackson, MD [facility] Office: [phone]   Current Outpatient Medications:    acetaminophen (TYLENOL) 500 MG tablet, Take 1,000 mg by mouth every 6 (six) hours as needed for mild pain., Disp: , Rfl:    albuterol (VENTOLIN HFA) 108 (90 Base) MCG/ACT inhaler, Inhale 2 puffs into the lungs every 6 (six) hours as needed for wheezing or shortness of breath., Disp: 8 g, Rfl: 6   amoxicillin-clavulanate (AUGMENTIN) 875-125 MG tablet, Take 1 tablet by mouth every 12 (twelve) hours., Disp: 20 tablet, Rfl: 0   atorvastatin (LIPITOR) 40 MG tablet, Take 1 tablet (40 mg total) by mouth daily., Disp: 30 tablet, Rfl: 1   buPROPion (WELLBUTRIN) 75 MG tablet, Take 1 tablet (75 mg total) by mouth 2 (two) times daily., Disp: 270 tablet, Rfl: 0   escitalopram (LEXAPRO) 20 MG tablet, Take 1 tablet (20 mg total) by mouth daily., Disp: 90 tablet, Rfl: 1   fluticasone (FLONASE) 50 MCG/ACT nasal spray, Place 1 spray into both nostrils daily as needed  for allergies., Disp: , Rfl:    Multiple Vitamin (MULTIVITAMIN) tablet, Take by mouth daily. 2 chews daily, Disp: , Rfl:    mycophenolate (CELLCEPT) 500 MG tablet,  Take 2 tablets (1,000 mg total) by mouth 2 (two) times daily., Disp: 360 tablet, Rfl: 3   pantoprazole (PROTONIX) 40 MG tablet, Take 1 tablet (40 mg total) by mouth daily., Disp: 90 tablet, Rfl: 3   predniSONE (DELTASONE) 10 MG tablet, 4 tabs for 3 days, then 3 tabs for 3 days, 2 tabs for 3 days, then 1 tab for 3 days, then stop, Disp: 30 tablet, Rfl: 0   telmisartan-hydrochlorothiazide (MICARDIS HCT) 80-25 MG tablet, Take 1 tablet by mouth daily., Disp: 30 tablet, Rfl: 1   warfarin (COUMADIN) 5 MG tablet, TAKE 2 TABLETS DAILY BY MOUTH EXCEPT TAKE 3 TABLETS ON MONDAYS, WEDNESDAYS AND FRIDAYS OR AS DIRECTED BY ANTICOAGULATION CLINIC., Disp: 200 tablet, Rfl: 1

## 2022-06-10 ENCOUNTER — Ambulatory Visit (INDEPENDENT_AMBULATORY_CARE_PROVIDER_SITE_OTHER): Payer: BC Managed Care – PPO

## 2022-06-10 DIAGNOSIS — Z7901 Long term (current) use of anticoagulants: Secondary | ICD-10-CM | POA: Diagnosis not present

## 2022-06-10 LAB — POCT INR: INR: 5.3 — AB (ref 2.0–3.0)

## 2022-06-10 NOTE — Progress Notes (Addendum)
Pt has taken ibuprofen in the last couple of days for back pain. 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   Hold dose today and hold dose tomorrow and then change weekly dose to take 2 tablets daily except take 3 tablets on Mondays. Recheck in 2 weeks.

## 2022-06-10 NOTE — Patient Instructions (Addendum)
Pre visit review using our clinic review tool, if applicable. No additional management support is needed unless otherwise documented below in the visit note.  Hold dose today and hold dose tomorrow and then change weekly dose to take 2 tablets daily except take 3 tablets on Mondays. Recheck in 2 weeks.

## 2022-06-11 ENCOUNTER — Ambulatory Visit: Payer: BC Managed Care – PPO | Admitting: Physician Assistant

## 2022-06-11 ENCOUNTER — Encounter: Payer: Self-pay | Admitting: Physician Assistant

## 2022-06-11 VITALS — BP 126/70 | HR 74 | Temp 98.8°F | Ht 74.0 in | Wt 309.5 lb

## 2022-06-11 DIAGNOSIS — F32A Depression, unspecified: Secondary | ICD-10-CM

## 2022-06-11 DIAGNOSIS — L989 Disorder of the skin and subcutaneous tissue, unspecified: Secondary | ICD-10-CM

## 2022-06-11 DIAGNOSIS — F419 Anxiety disorder, unspecified: Secondary | ICD-10-CM

## 2022-06-11 MED ORDER — TRIAMCINOLONE ACETONIDE 0.1 % EX CREA: TOPICAL_CREAM | CUTANEOUS | 0 refills | Status: AC

## 2022-06-11 NOTE — Patient Instructions (Signed)
I am placing referral to a new talk therapist.  Suspect your lesion is a seborrheic keratosis -- see information at the end of this packet Apply some triamcinolone to this to see if this helps your symptoms

## 2022-06-11 NOTE — Progress Notes (Signed)
Wesley Hancock is a 51 y.o. male here for a new problem of skin lesions.   History of Present Illness:   Chief Complaint  Patient presents with   Spots on arms    Spots on arms that started 3 months ago with one spot, since then 3 more have come up itchy and sore Has an appointment with derm coming up     HPI  Skin Lesions  Patient has noticed some spots on bilateral arms that been onset for 3 months. The area is itchy and sore. States this has been getting larger in size. Has tried some medicated cream with no relief. States he had noticed some new spots which was not there before. Has had some pain. He is concerned about this issue and would like to be further evaluated. Has scheduled appointment with dermatologist. Denies any other worsening sx.   Depression and Anxiety Currently taking Wellbutrin 75 mg BID and Lexapro 20 mg daily. Still trying to find a therapist. Denies SI/HI. Would like referral for talk therapy.  Past Medical History:  Diagnosis Date   Acute nonintractable headache 09/06/2019   Acute respiratory failure with hypoxia ([place]) 08/30/2019   AKI (acute kidney injury) ([place]) 08/30/2019   Antiphospholipid syndrome ([place]) 10/30/2019   Anxiety    APS (antiphospholipid syndrome) ([place])    Arthritis    Colon polyp    ? hyperplastic   Depression    Diarrhea 05/21/2020   DVT (deep venous thrombosis) ([name])    Dyspnea on exertion 09/08/2017   "Quit smoking" 2015 with onset of symptoms in 2016  Spirometry 09/08/2017  Flat f/v loop  - d/c acei 09/08/2017  - 01/04/2020   Walked RA x two laps =  approx 54f @ fast pace - stopped due to end of study/ min sob with sats of 93 % at the end of the study. - PFT's  03/08/20   FEV1 3.84 (83 % ) ratio 0..90  p 3 % improvement from saba p ? 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 06/26/20, 03/08/20   Snoring 07/09/2020   Stress fracture    in back, seeing PT   Stroke ([facility]    Upper airway cough syndrome 01/05/2020   Onset mid Jan 2021 while on otc PPI  - max rx for gerd 01/04/2020 >>>      Witnessed apneic spells 07/09/2020     Social History   Tobacco Use   Smoking status: Former    Packs/day: 1.00    Years: 20.00    Total pack years: 20.00    Types: Cigarettes  Quit date: 11/30/2013    Years since quitting: 8.5   Smokeless tobacco: Former    Types: Snuff    Quit date: 05/21/2018  Vaping Use   Vaping Use: Never used  Substance Use Topics   Alcohol use: Not Currently   Drug use: No    Past Surgical History:  Procedure Laterality Date   BRONCHIAL WASHINGS  01/14/2021   Procedure: BRONCHIAL WASHINGS;  Surgeon: Freddi Starr, MD;  Location: [facility];  Service: Pulmonary;;   CHOLECYSTECTOMY     VIDEO BRONCHOSCOPY N/A 01/14/2021   Procedure: VIDEO BRONCHOSCOPY WITHOUT FLUORO;  Surgeon: Freddi Starr, MD;  Location: [facility];  Service: Pulmonary;  Laterality: N/A;    Family History  Problem Relation Age of Onset   Diabetes Mother    Hyperlipidemia Mother    Hypertension Mother    Thyroid disease Mother        uncertain type--had surg--no cancer    Asthma Mother        died from PNA   Heart disease Father 60   Hyperlipidemia Father    Hypertension Father    Prostate cancer Father 74   Lung cancer Father 62       Dx 06/18/2017   Colon polyps Father    Irritable bowel syndrome Father    Diverticulitis Father    Diabetes Sister    Hyperlipidemia Sister    Hypertension Sister    Diabetes Maternal Grandmother    Heart disease Maternal Grandmother    Hyperlipidemia Maternal Grandmother    Hypertension Maternal Grandmother    Colon cancer Maternal Grandmother    Heart disease Maternal Grandfather    Hyperlipidemia Maternal Grandfather    Hypertension Maternal Grandfather    Stroke Maternal Grandfather    Liver cancer Maternal Grandfather    Irritable bowel syndrome Maternal Grandfather    Heart disease Paternal Grandmother    Healthy Daughter    Healthy Daughter    Healthy Son     Allergies  Allergen Reactions   Diltiazem Hcl Diarrhea and Other (See Comments)    Lethargic     Current Medications:   Current Outpatient Medications:    acetaminophen (TYLENOL) 500 MG tablet, Take 1,000 mg by mouth every 6 (six) hours as needed for mild pain., Disp: , Rfl:    albuterol (VENTOLIN HFA) 108 (90 Base) MCG/ACT inhaler, Inhale 2 puffs into the lungs every 6 (six) hours as needed for wheezing or shortness of breath., Disp: 8 g, Rfl: 6   atorvastatin (LIPITOR) 40 MG tablet, Take 1 tablet (40 mg total) by mouth daily., Disp: 30 tablet, Rfl: 1   buPROPion (WELLBUTRIN) 75 MG tablet, Take 1 tablet (75 mg total) by mouth 2 (two) times daily., Disp: 270 tablet, Rfl: 0   escitalopram (LEXAPRO) 20 MG tablet, Take 1 tablet (20 mg total) by mouth daily., Disp: 90 tablet, Rfl: 1   fluticasone (FLONASE) 50 MCG/ACT nasal spray, Place 1 spray into both nostrils daily as needed for allergies., Disp: , Rfl:    Multiple Vitamin (MULTIVITAMIN) tablet, Take by mouth daily. 2 chews daily, Disp: , Rfl:    mycophenolate (CELLCEPT) 500 MG tablet, Take 2  tablets (1,000 mg total) by mouth 2 (two) times daily., Disp: 360 tablet, Rfl: 3   pantoprazole (PROTONIX) 40 MG tablet, Take 1 tablet (40 mg total) by mouth daily., Disp: 90 tablet, Rfl: 3   telmisartan-hydrochlorothiazide (MICARDIS HCT) 80-25 MG tablet, Take 1 tablet by mouth daily., Disp: 30 tablet, Rfl: 1  warfarin (COUMADIN) 5 MG tablet, TAKE 2 TABLETS DAILY BY MOUTH EXCEPT TAKE 3 TABLETS ON MONDAYS, WEDNESDAYS AND FRIDAYS OR AS DIRECTED BY ANTICOAGULATION CLINIC., Disp: 200 tablet, Rfl: 1   amoxicillin-clavulanate (AUGMENTIN) 875-125 MG tablet, Take 1 tablet by mouth every 12 (twelve) hours. (Patient not taking: Reported on 06/11/2022), Disp: 20 tablet, Rfl: 0   predniSONE (DELTASONE) 10 MG tablet, 4 tabs for 3 days, then 3 tabs for 3 days, 2 tabs for 3 days, then 1 tab for 3 days, then stop (Patient not taking: Reported on 06/11/2022), Disp: 30 tablet, Rfl: 0   Review of Systems:   ROS Negative unless otherwise specified per HPI.   Vitals:   Vitals:   06/11/22 1111  BP: 126/70  Pulse: 74  Temp: 98.8 F (37.1 C)  TempSrc: Temporal  SpO2: 94%  Weight: (!) 309 lb 8 oz (140.4 kg)  Height: '6\' 2"'$  (1.88 m)     Body mass index is 39.74 kg/m.  Physical Exam:   Physical Exam Vitals and nursing note reviewed.  Constitutional:      General: He is not in acute distress.    Appearance: He is well-developed. He is not ill-appearing or toxic-appearing.  Cardiovascular:     Rate and Rhythm: Normal rate and regular rhythm.     Pulses: Normal pulses.     Heart sounds: Normal heart sounds, S1 normal and S2 normal.  Pulmonary:     Effort: Pulmonary effort is normal.     Breath sounds: Normal breath sounds.  Skin:    General: Skin is warm and dry.     Comments: 3 x 1/2 cm raised hyperpigmented plaques to bilateral posterior aspect of upper arms  Neurological:     Mental Status: He is alert.     GCS: GCS eye subscore is 4. GCS verbal subscore is 5. GCS motor subscore is 6.   Psychiatric:        Speech: Speech normal.        Behavior: Behavior normal. Behavior is cooperative.     Assessment and Plan:   Anxiety and depression Improving Continue Wellbutrin 75 mg BID and Lexapro 20 mg daily Talk therapy referral placed I discussed with patient that if they develop any SI, to tell someone immediately and seek medical attention. Follow-up in 6 months, sooner if concerns  Skin lesion Also evaluated by Dr. Jerline Pain, suspect seborrheic keratosis Will trial topical triamcinolone ointment Derm appt pending Follow-up with [place] as needed  I,Savera Zaman,acting as a scribe for Inda Coke, PA.,have documented all relevant documentation on the behalf of Inda Coke, PA,as directed by  Inda Coke, PA while in the presence of Inda Coke, [place].   I, Inda Coke, [place], have reviewed all documentation for this visit. The documentation on 06/11/22 for the exam, diagnosis, procedures, and orders are all accurate and complete.   Inda Coke, PA-C

## 2022-06-14 ENCOUNTER — Other Ambulatory Visit: Payer: Self-pay | Admitting: Physician Assistant

## 2022-06-16 DIAGNOSIS — H35033 Hypertensive retinopathy, bilateral: Secondary | ICD-10-CM | POA: Diagnosis not present

## 2022-06-23 ENCOUNTER — Other Ambulatory Visit: Payer: Self-pay | Admitting: Physician Assistant

## 2022-06-23 MED ORDER — TELMISARTAN 80 MG PO TABS
80.0000 mg | ORAL_TABLET | Freq: Every day | ORAL | 1 refills | Status: DC
Start: 2022-06-23 — End: 2022-09-30

## 2022-06-23 MED ORDER — HYDROCHLOROTHIAZIDE 25 MG PO TABS: 25.0000 mg | ORAL_TABLET | Freq: Every day | ORAL | 1 refills | Status: DC

## 2022-06-24 ENCOUNTER — Ambulatory Visit (INDEPENDENT_AMBULATORY_CARE_PROVIDER_SITE_OTHER): Payer: BC Managed Care – PPO

## 2022-06-24 DIAGNOSIS — Z7901 Long term (current) use of anticoagulants: Secondary | ICD-10-CM | POA: Diagnosis not present

## 2022-06-24 LAB — POCT INR: INR: 3 (ref 2.0–3.0)

## 2022-06-24 NOTE — Patient Instructions (Addendum)
Pre visit review using our clinic review tool, if applicable. No additional management support is needed unless otherwise documented below in the visit note.  Continue 2 tablets daily except take 3 tablets on Mondays. Recheck in 6 weeks at [facility].

## 2022-06-24 NOTE — Progress Notes (Signed)
Continue 2 tablets daily except take 3 tablets on Mondays. Recheck in 6 weeks at [facility].

## 2022-07-10 ENCOUNTER — Other Ambulatory Visit: Payer: Self-pay

## 2022-07-10 ENCOUNTER — Encounter (HOSPITAL_BASED_OUTPATIENT_CLINIC_OR_DEPARTMENT_OTHER): Payer: Self-pay

## 2022-07-10 DIAGNOSIS — R110 Nausea: Secondary | ICD-10-CM | POA: Insufficient documentation

## 2022-07-10 DIAGNOSIS — Z5321 Procedure and treatment not carried out due to patient leaving prior to being seen by health care provider: Secondary | ICD-10-CM | POA: Insufficient documentation

## 2022-07-10 DIAGNOSIS — R0602 Shortness of breath: Secondary | ICD-10-CM | POA: Insufficient documentation

## 2022-07-10 DIAGNOSIS — R0789 Other chest pain: Secondary | ICD-10-CM | POA: Insufficient documentation

## 2022-07-10 LAB — BASIC METABOLIC PANEL
Anion gap: 11 (ref 5–15)
BUN: 18 mg/dL (ref 6–20)
CO2: 23 mmol/L (ref 22–32)
Calcium: 10 mg/dL (ref 8.9–10.3)
Chloride: 100 mmol/L (ref 98–111)
Creatinine, Ser: 1.12 mg/dL (ref 0.61–1.24)
GFR, Estimated: >60 mL/min (ref >60)
Glucose, Bld: 124 mg/dL — ABNORMAL HIGH (ref 70–99)
Potassium: 3.8 mmol/L (ref 3.5–5.1)
Sodium: 134 mmol/L — ABNORMAL LOW (ref 135–145)

## 2022-07-10 LAB — CBC
HCT: 48.3 % (ref 39.0–52.0)
Hemoglobin: 16.5 g/dL (ref 13.0–17.0)
MCH: 29.9 pg (ref 26.0–34.0)
MCHC: 34.2 g/dL (ref 30.0–36.0)
MCV: 87.7 fL (ref 80.0–100.0)
Platelets: 290 10*3/uL (ref 150–400)
RBC: 5.51 MIL/uL (ref 4.22–5.81)
RDW: 13.7 % (ref 11.5–15.5)
WBC: 9.1 10*3/uL (ref 4.0–10.5)
nRBC: 0 % (ref 0.0–0.2)

## 2022-07-10 LAB — TROPONIN I (HIGH SENSITIVITY): Troponin I (High Sensitivity): 4 ng/L (ref <18)

## 2022-07-10 LAB — PROTIME-INR
INR: 2.1 — ABNORMAL HIGH (ref 0.8–1.2)
Prothrombin Time: 23.1 seconds — ABNORMAL HIGH (ref 11.4–15.2)

## 2022-07-10 NOTE — ED Triage Notes (Signed)
Patient here POV from Home.  Endorses CP for approximately 4 Hours. Mainly Left Chest and radiates Upwards to Shoulder. Some SOB. Some Nausea.   History of Interstitial Lung Disease and Lupus. No Emesis. No Diarrhea.  NAD Noted during Triage. A&Ox4. GCS 15. Ambulatory.

## 2022-07-11 ENCOUNTER — Emergency Department (HOSPITAL_BASED_OUTPATIENT_CLINIC_OR_DEPARTMENT_OTHER)
Admission: EM | Admit: 2022-07-11 | Discharge: 2022-07-11 | Discharge status: Against Medical Advice | Payer: BC Managed Care – PPO | Attending: Emergency Medicine | Admitting: Emergency Medicine

## 2022-07-25 DIAGNOSIS — S52124A Nondisplaced fracture of head of right radius, initial encounter for closed fracture: Secondary | ICD-10-CM | POA: Diagnosis not present

## 2022-07-25 DIAGNOSIS — I10 Essential (primary) hypertension: Secondary | ICD-10-CM | POA: Diagnosis not present

## 2022-07-25 DIAGNOSIS — Y990 Civilian activity done for income or pay: Secondary | ICD-10-CM | POA: Diagnosis not present

## 2022-07-25 DIAGNOSIS — Z79899 Other long term (current) drug therapy: Secondary | ICD-10-CM | POA: Diagnosis not present

## 2022-07-25 DIAGNOSIS — Z87891 Personal history of nicotine dependence: Secondary | ICD-10-CM | POA: Diagnosis not present

## 2022-07-25 DIAGNOSIS — Z888 Allergy status to other drugs, medicaments and biological substances status: Secondary | ICD-10-CM | POA: Diagnosis not present

## 2022-07-25 DIAGNOSIS — Z7901 Long term (current) use of anticoagulants: Secondary | ICD-10-CM | POA: Diagnosis not present

## 2022-07-25 DIAGNOSIS — Y9389 Activity, other specified: Secondary | ICD-10-CM | POA: Diagnosis not present

## 2022-07-25 DIAGNOSIS — W010XXA Fall on same level from slipping, tripping and stumbling without subsequent striking against object, initial encounter: Secondary | ICD-10-CM | POA: Diagnosis not present

## 2022-07-25 DIAGNOSIS — S59911A Unspecified injury of right forearm, initial encounter: Secondary | ICD-10-CM | POA: Diagnosis not present

## 2022-07-25 DIAGNOSIS — S4991XA Unspecified injury of right shoulder and upper arm, initial encounter: Secondary | ICD-10-CM | POA: Diagnosis not present

## 2022-07-31 DIAGNOSIS — M25521 Pain in right elbow: Secondary | ICD-10-CM | POA: Diagnosis not present

## 2022-07-31 DIAGNOSIS — S52121A Displaced fracture of head of right radius, initial encounter for closed fracture: Secondary | ICD-10-CM | POA: Diagnosis not present

## 2022-08-04 ENCOUNTER — Other Ambulatory Visit: Payer: Self-pay | Admitting: Family Medicine

## 2022-08-06 ENCOUNTER — Telehealth: Payer: Self-pay

## 2022-08-06 NOTE — Telephone Encounter (Signed)
Pt reports he has been diagnosed with COVID and will have to RS his coumadin clinic apt tomorrow at [facility].  LVM for pt to return call to RS.

## 2022-08-07 ENCOUNTER — Ambulatory Visit: Payer: Self-pay

## 2022-08-07 NOTE — Telephone Encounter (Signed)
Contacted pt who reports he is doing ok with COVID and "it is not any worse than a cold". Pt reports he will be off of quarantine on 9/12 and is going out of town on 9/13. RS pt for coumadin clinic on 9/20. Pt also reported he broke his elbow on 8/26 also but is doing well with that also. Advised if anything changes contact the office. Pt verbalized understanding.

## 2022-08-08 ENCOUNTER — Telehealth: Payer: Self-pay | Admitting: Internal Medicine

## 2022-08-08 NOTE — Telephone Encounter (Signed)
COVID positive   Paxlovid (nirmatelvir 300/Ritonavir100) - BID x 5 days - for GFR >= 60 - Aug 2023 - normal  He is not on any of the meds below. Closest is lipitor  Hold lipitor while on paxlovid    [facility] [address] [facility]  Also discussed ILD and research participatino - he will talk to Dr Erin Fulling about this  Will ask office to make appt iwht Dr Erin Fulling  - first availwith PFT    SIGNATURE    Dr. Brand Males, M.D., F.C.C.P,  Pulmonary and Critical Care Medicine Staff Physician, [place] Director - Interstitial Lung Disease  Program  Medical Director - [facility] [place] at [place], [place], [address]  NPI Number:  NPI #[id number] [facility] Number: [id number]  Pager: [phone], If no answer  -> Check AMION or Try [id number] Telephone (clinical office): [phone] Telephone (research): [phone]  9:38 AM 08/08/2022    Xxxxxxxxxxxxxxxxxxxxx[id number]   PLEASE CHECK MED LIST for the following issues. Please check the 2 different condition related to concomitant medications in alphabetical order  If Wesley Hancock with DOB 12-29-1970 is on any of the following Strong CYP3A inhibtors - this patient Wesley Hancock should withold these concomitant meds so they can start paxlovid stratight away. If taking any of these:  alfuzosin, amiodarone, clozapine, colchicine, dihydroergotamine, dronedarone, ergotamine, flecainide, lovastatin (he is on atorvastian), lurasidone, methylergonovine, midazolam [oral], pethidine, pimozide, propafenone, propoxyphene, quinidine, ranolazine, sildenafil simvastatin, triazolam).   If   Wesley Hancock  with dob 02/29/1971 Is on any of these other strong CYP3A inducers then starting paxlovid should be delayed and the following meds should wash out first. These are dapalutamide, carbamazepine, phenobarbital, phenytoin, rifampin,  St John's wort) - let me know immediately and we should delay starting paxlovid by some days even if he stops these medication.    PLEASE INFORM Wesley Hancock  OF FOLLOWING SIDE EFFECTS  Side effects - all < 5%  - skin rash (and veyr rare a conditon called TEN) - angiomedia  - myalgia - jaundice - high bP (1%) - loss of taste  - diarrhea     Current Outpatient Medications:    acetaminophen (TYLENOL) 500 MG tablet, Take 1,000 mg by mouth every 6 (six) hours as needed for mild pain., Disp: , Rfl:    albuterol (VENTOLIN HFA) 108 (90 Base) MCG/ACT inhaler, Inhale 2 puffs into the lungs every 6 (six) hours as needed for wheezing or shortness of breath., Disp: 8 g, Rfl: 6   amoxicillin-clavulanate (AUGMENTIN) 875-125 MG tablet, Take 1 tablet by mouth every 12 (twelve) hours. (Patient not taking: Reported on 06/11/2022), Disp: 20 tablet, Rfl: 0   atorvastatin (LIPITOR) 40 MG tablet, Take 1 tablet (40 mg total) by mouth daily., Disp: 30 tablet, Rfl: 1   buPROPion (WELLBUTRIN) 75 MG tablet, Take 1 tablet (75 mg total) by mouth 2 (two) times daily., Disp: 270 tablet, Rfl: 0   escitalopram (LEXAPRO) 20 MG tablet, Take 1 tablet by mouth once daily, Disp: 90 tablet, Rfl: 0   fluticasone (FLONASE) 50 MCG/ACT nasal spray, Place 1 spray into both nostrils daily as needed for allergies., Disp: , Rfl:    hydrochlorothiazide (HYDRODIURIL) 25 MG tablet, Take 1 tablet (25 mg total) by mouth daily., Disp: 90 tablet, Rfl: 1   Multiple Vitamin (MULTIVITAMIN) tablet, Take by mouth daily. 2 chews daily, Disp: , Rfl:  mycophenolate (CELLCEPT) 500 MG tablet, Take 2 tablets (1,000 mg total) by mouth 2 (two) times daily., Disp: 360 tablet, Rfl: 3   pantoprazole (PROTONIX) 40 MG tablet, Take 1 tablet (40 mg total) by mouth daily., Disp: 90 tablet, Rfl: 3   predniSONE (DELTASONE) 10 MG tablet, 4 tabs for 3 days, then 3 tabs for 3 days, 2 tabs for 3 days, then 1 tab for 3 days, then stop (Patient not taking:  Reported on 06/11/2022), Disp: 30 tablet, Rfl: 0   telmisartan (MICARDIS) 80 MG tablet, Take 1 tablet (80 mg total) by mouth daily., Disp: 90 tablet, Rfl: 1   triamcinolone cream (KENALOG) 0.1 %, Apply to affected area 1-2 times daily, Disp: 30 g, Rfl: 0   warfarin (COUMADIN) 5 MG tablet, TAKE 2 TABLETS DAILY BY MOUTH EXCEPT TAKE 3 TABLETS ON MONDAYS, WEDNESDAYS AND FRIDAYS OR AS DIRECTED BY ANTICOAGULATION CLINIC., Disp: 200 tablet, Rfl: 1    Allergies  Allergen Reactions   Diltiazem Hcl Diarrhea and Other (See Comments)    Lethargic       Latest Ref Rng & Units 03/25/2021    8:59 AM 03/08/2020    2:51 PM  PFT Results  FVC-Pre L 4.17  4.31   FVC-Predicted Pre % 70  72   FVC-Post L 4.02  4.29   FVC-Predicted Post % 68  72   Pre FEV1/FVC % % 88  86   Post FEV1/FCV % % 91  90   FEV1-Pre L 3.66  3.71   FEV1-Predicted Pre % 79  80   FEV1-Post L 3.67  3.84   DLCO uncorrected ml/min/mmHg 26.04  26.46   DLCO UNC% % 76  77   DLCO corrected ml/min/mmHg 25.42  26.46   DLCO COR %Predicted % 74  77   DLVA Predicted % 96  99   TLC L 6.11  5.98   TLC % Predicted % 77  76   RV % Predicted % 74  67

## 2022-08-10 NOTE — Telephone Encounter (Signed)
LMTCB

## 2022-08-19 ENCOUNTER — Ambulatory Visit (INDEPENDENT_AMBULATORY_CARE_PROVIDER_SITE_OTHER): Payer: Self-pay

## 2022-08-19 DIAGNOSIS — Z7901 Long term (current) use of anticoagulants: Secondary | ICD-10-CM

## 2022-08-19 LAB — POCT INR: INR: 3.9 — AB (ref 2.0–3.0)

## 2022-08-19 NOTE — Progress Notes (Deleted)
hi

## 2022-08-19 NOTE — Progress Notes (Signed)
Pt reports being on Paxlovid that he finished on 9/12. He then went on vacation where he reports drinking 3-4 beers/day.  Hold today's dose, then continue 2 tablets daily except take 3 tablets on Mondays. Recheck in 3 weeks.  Weekly dose not changed today due to COVID, Paxlovid, and increased alcohol consumption and pt previously being therapeutic at this dosing regimen.

## 2022-08-19 NOTE — Patient Instructions (Addendum)
Hold dose today then continue 2 tablets daily except take 3 tablets on Mondays. Recheck in 3 weeks.

## 2022-08-21 DIAGNOSIS — M654 Radial styloid tenosynovitis [de Quervain]: Secondary | ICD-10-CM | POA: Diagnosis not present

## 2022-08-21 DIAGNOSIS — M25531 Pain in right wrist: Secondary | ICD-10-CM | POA: Diagnosis not present

## 2022-08-21 DIAGNOSIS — S52121A Displaced fracture of head of right radius, initial encounter for closed fracture: Secondary | ICD-10-CM | POA: Diagnosis not present

## 2022-08-24 ENCOUNTER — Encounter: Payer: Self-pay | Admitting: *Deleted

## 2022-08-28 ENCOUNTER — Telehealth: Payer: Self-pay | Admitting: Physician Assistant

## 2022-08-28 DIAGNOSIS — J849 Interstitial pulmonary disease, unspecified: Secondary | ICD-10-CM

## 2022-08-28 MED ORDER — ESCITALOPRAM OXALATE 20 MG PO TABS: 20.0000 mg | ORAL_TABLET | Freq: Every day | ORAL | 1 refills | Status: DC

## 2022-08-28 MED ORDER — MYCOPHENOLATE MOFETIL 500 MG PO TABS: 1000.0000 mg | ORAL_TABLET | Freq: Two times a day (BID) | ORAL | 1 refills | Status: DC

## 2022-08-28 NOTE — Telephone Encounter (Signed)
  LAST APPOINTMENT DATE:  06/11/22  NEXT APPOINTMENT DATE: 09/09/22  MEDICATION: escitalopram (LEXAPRO) 20 MG tablet  mycophenolate (CELLCEPT) 500 MG tablet   Is the patient out of medication? Yes  [facility] [address] [place], [place] [address]  Patient states: -He is hoping meds can be refilled today because he didn't realize he ran out until today

## 2022-08-28 NOTE — Telephone Encounter (Signed)
[facility]

## 2022-09-06 ENCOUNTER — Emergency Department (HOSPITAL_BASED_OUTPATIENT_CLINIC_OR_DEPARTMENT_OTHER)
Admission: EM | Admit: 2022-09-06 | Discharge: 2022-09-07 | Disposition: A | Discharge status: Home / Self Care | Payer: BC Managed Care – PPO | Attending: Emergency Medicine | Admitting: Emergency Medicine

## 2022-09-06 ENCOUNTER — Encounter (HOSPITAL_BASED_OUTPATIENT_CLINIC_OR_DEPARTMENT_OTHER): Payer: Self-pay

## 2022-09-06 ENCOUNTER — Other Ambulatory Visit: Payer: Self-pay

## 2022-09-06 ENCOUNTER — Emergency Department (HOSPITAL_BASED_OUTPATIENT_CLINIC_OR_DEPARTMENT_OTHER): Payer: BC Managed Care – PPO

## 2022-09-06 DIAGNOSIS — R1033 Periumbilical pain: Secondary | ICD-10-CM | POA: Diagnosis not present

## 2022-09-06 DIAGNOSIS — Z79899 Other long term (current) drug therapy: Secondary | ICD-10-CM | POA: Diagnosis not present

## 2022-09-06 DIAGNOSIS — K5790 Diverticulosis of intestine, part unspecified, without perforation or abscess without bleeding: Secondary | ICD-10-CM | POA: Insufficient documentation

## 2022-09-06 DIAGNOSIS — I10 Essential (primary) hypertension: Secondary | ICD-10-CM | POA: Insufficient documentation

## 2022-09-06 DIAGNOSIS — N3289 Other specified disorders of bladder: Secondary | ICD-10-CM | POA: Diagnosis not present

## 2022-09-06 DIAGNOSIS — N281 Cyst of kidney, acquired: Secondary | ICD-10-CM | POA: Diagnosis not present

## 2022-09-06 DIAGNOSIS — K5792 Diverticulitis of intestine, part unspecified, without perforation or abscess without bleeding: Secondary | ICD-10-CM

## 2022-09-06 DIAGNOSIS — R1031 Right lower quadrant pain: Secondary | ICD-10-CM | POA: Diagnosis not present

## 2022-09-06 LAB — COMPREHENSIVE METABOLIC PANEL
ALT: 37 U/L (ref 0–44)
AST: 19 U/L (ref 15–41)
Albumin: 4 g/dL (ref 3.5–5.0)
Alkaline Phosphatase: 47 U/L (ref 38–126)
Anion gap: 10 (ref 5–15)
BUN: 20 mg/dL (ref 6–20)
CO2: 24 mmol/L (ref 22–32)
Calcium: 9.4 mg/dL (ref 8.9–10.3)
Chloride: 98 mmol/L (ref 98–111)
Creatinine, Ser: 1.32 mg/dL — ABNORMAL HIGH (ref 0.61–1.24)
GFR, Estimated: >60 mL/min (ref >60)
Glucose, Bld: 114 mg/dL — ABNORMAL HIGH (ref 70–99)
Potassium: 3.6 mmol/L (ref 3.5–5.1)
Sodium: 132 mmol/L — ABNORMAL LOW (ref 135–145)
Total Bilirubin: 1.1 mg/dL (ref 0.3–1.2)
Total Protein: 7.7 g/dL (ref 6.5–8.1)

## 2022-09-06 LAB — CBC
HCT: 46.2 % (ref 39.0–52.0)
Hemoglobin: 15.6 g/dL (ref 13.0–17.0)
MCH: 29.7 pg (ref 26.0–34.0)
MCHC: 33.8 g/dL (ref 30.0–36.0)
MCV: 88 fL (ref 80.0–100.0)
Platelets: 272 10*3/uL (ref 150–400)
RBC: 5.25 MIL/uL (ref 4.22–5.81)
RDW: 13.5 % (ref 11.5–15.5)
WBC: 12.8 10*3/uL — ABNORMAL HIGH (ref 4.0–10.5)
nRBC: 0 % (ref 0.0–0.2)

## 2022-09-06 LAB — LIPASE, BLOOD: Lipase: 35 U/L (ref 11–51)

## 2022-09-06 MED ORDER — SODIUM CHLORIDE 0.9 % IV BOLUS
1000.0000 mL | Freq: Once | INTRAVENOUS | Status: AC
  Administered 2022-09-07: 1000 mL via INTRAVENOUS

## 2022-09-06 MED ORDER — IOHEXOL 300 MG/ML  SOLN
100.0000 mL | Freq: Once | INTRAMUSCULAR | Status: AC | PRN
  Administered 2022-09-06: 100 mL via INTRAVENOUS

## 2022-09-06 NOTE — ED Notes (Signed)
EDP at bedside  

## 2022-09-06 NOTE — ED Triage Notes (Signed)
Pt presents to the ED with RLQ abdominal pain. States that the pain started in his umbilical region yesterday and has settled into the RLQ. Pt reports nausea and vomiting at home. Pt A&ox4 at time of triage.

## 2022-09-06 NOTE — ED Notes (Signed)
Patient ambulatory to restroom, patient given urine cup.

## 2022-09-07 LAB — URINALYSIS, ROUTINE W REFLEX MICROSCOPIC
Bilirubin Urine: NEGATIVE
Glucose, UA: NEGATIVE mg/dL
Hgb urine dipstick: NEGATIVE
Ketones, ur: NEGATIVE mg/dL
Leukocytes,Ua: NEGATIVE
Nitrite: NEGATIVE
Specific Gravity, Urine: >1.046 — ABNORMAL HIGH (ref 1.005–1.030)
pH: 6 (ref 5.0–8.0)

## 2022-09-07 LAB — PROTIME-INR
INR: 1.7 — ABNORMAL HIGH (ref 0.8–1.2)
Prothrombin Time: 19.4 seconds — ABNORMAL HIGH (ref 11.4–15.2)

## 2022-09-07 MED ORDER — AMOXICILLIN-POT CLAVULANATE 875-125 MG PO TABS: 1.0000 | ORAL_TABLET | Freq: Two times a day (BID) | ORAL | 0 refills | Status: DC

## 2022-09-07 MED ORDER — OXYCODONE-ACETAMINOPHEN 5-325 MG PO TABS: 1.0000 | ORAL_TABLET | Freq: Four times a day (QID) | ORAL | 0 refills | Status: DC | PRN

## 2022-09-07 MED ORDER — PIPERACILLIN-TAZOBACTAM 3.375 G IVPB 30 MIN
3.3750 g | Freq: Once | INTRAVENOUS | Status: AC
  Administered 2022-09-07: 3.375 g via INTRAVENOUS
  Filled 2022-09-07: qty 50

## 2022-09-07 NOTE — ED Notes (Signed)
Patient drinking bottle of water at this time. Tolerating well.

## 2022-09-07 NOTE — ED Provider Notes (Signed)
[facility]ERGENC[facility] DEPT Provider Note   CSN: [id number] Arrival date & time: 09/06/22  2234     History  Chief Complaint  Patient presents with   Abdominal Pain    Wesley Hancock is a 51 y.o. male.  HPI     This is a 51 year old male who presents with abdominal pain.  Patient reports 2-day history of worsening periumbilical and lower abdominal pain.  States that he woke up yesterday morning with pain just below his bellybutton.  Pain is worse with certain movements.  He states progressively pain has settled in lower and to the right side.  He has also had some loose stools.  He believes he saw some blood in his stool earlier today.  He has noted some nausea and vomiting at home.  No noted fevers but temperature in triage 100.2.  Home Medications Prior to Admission medications   Medication Sig Start Date End Date Taking? Authorizing Provider  amoxicillin-clavulanate (AUGMENTIN) 875-125 MG tablet Take 1 tablet by mouth every 12 (twelve) hours. 09/07/22  Yes Samel Bruna, Barbette Hair, MD  oxyCODONE-acetaminophen (PERCOCET/ROXICET) 5-325 MG tablet Take 1 tablet by mouth every 6 (six) hours as needed for severe pain. 09/07/22  Yes Bianney Rockwood, Barbette Hair, MD  acetaminophen (TYLENOL) 500 MG tablet Take 1,000 mg by mouth every 6 (six) hours as needed for mild pain.    [provider]  albuterol (VENTOLIN HFA) 108 (90 Base) MCG/ACT inhaler Inhale 2 puffs into the lungs every 6 (six) hours as needed for wheezing or shortness of breath. 04/22/21   Freddi Starr, MD  atorvastatin (LIPITOR) 40 MG tablet Take 1 tablet (40 mg total) by mouth daily. 02/05/22   Vivi Barrack, MD  buPROPion (WELLBUTRIN) 75 MG tablet Take 1 tablet (75 mg total) by mouth 2 (two) times daily. 04/15/22   Inda Coke, PA  escitalopram (LEXAPRO) 20 MG tablet Take 1 tablet (20 mg total) by mouth daily. 08/28/22   Inda Coke, PA  fluticasone (FLONASE) 50 MCG/ACT nasal spray Place 1 spray into  both nostrils daily as needed for allergies.    [provider]  hydrochlorothiazide (HYDRODIURIL) 25 MG tablet Take 1 tablet (25 mg total) by mouth daily. 06/23/22   Inda Coke, PA  Multiple Vitamin (MULTIVITAMIN) tablet Take by mouth daily. 2 chews daily    [provider]  mycophenolate (CELLCEPT) 500 MG tablet Take 2 tablets (1,000 mg total) by mouth 2 (two) times daily. 08/28/22   Inda Coke, PA  pantoprazole (PROTONIX) 40 MG tablet Take 1 tablet (40 mg total) by mouth daily. 02/05/22   Vivi Barrack, MD  predniSONE (DELTASONE) 10 MG tablet 4 tabs for 3 days, then 3 tabs for 3 days, 2 tabs for 3 days, then 1 tab for 3 days, then stop Patient not taking: Reported on 06/11/2022 05/14/22   Clayton Bibles, NP  telmisartan (MICARDIS) 80 MG tablet Take 1 tablet (80 mg total) by mouth daily. 06/23/22   Inda Coke, PA  triamcinolone cream (KENALOG) 0.1 % Apply to affected area 1-2 times daily 06/11/22   Inda Coke, PA  warfarin (COUMADIN) 5 MG tablet TAKE 2 TABLETS DAILY BY MOUTH EXCEPT TAKE 3 TABLETS ON MONDAYS, WEDNESDAYS AND FRIDAYS OR AS DIRECTED BY ANTICOAGULATION CLINIC. 04/29/22   Inda Coke, PA      Allergies    Diltiazem hcl    Review of Systems   Review of Systems  Constitutional:  Positive for fever.  Gastrointestinal:  Positive for abdominal pain,  diarrhea, nausea and vomiting.  All other systems reviewed and are negative.   Physical Exam Updated Vital Signs BP 106/76   Pulse 100   Temp 99.2 F (37.3 C) (Oral)   Resp 18   Ht 1.854 m ('6\' 1"'$ )   Wt 136.1 kg   SpO2 96%   BMI 39.58 kg/m  Physical Exam Vitals and nursing note reviewed.  Constitutional:      Appearance: He is well-developed. He is obese. He is not ill-appearing.  HENT:     Head: Normocephalic and atraumatic.  Eyes:     Pupils: Pupils are equal, round, and reactive to light.  Cardiovascular:     Rate and Rhythm: Regular rhythm. Tachycardia present.     Heart  sounds: Normal heart sounds. No murmur heard. Pulmonary:     Effort: Pulmonary effort is normal. No respiratory distress.     Breath sounds: Normal breath sounds. No wheezing.  Abdominal:     General: Bowel sounds are normal.     Palpations: Abdomen is soft.     Tenderness: There is abdominal tenderness in the right lower quadrant, periumbilical area and suprapubic area. There is no guarding or rebound.  Musculoskeletal:     Cervical back: Neck supple.  Lymphadenopathy:     Cervical: No cervical adenopathy.  Skin:    General: Skin is warm and dry.  Neurological:     Mental Status: He is alert and oriented to person, place, and time.     ED Results / Procedures / Treatments   Labs (all labs ordered are listed, but only abnormal results are displayed) Labs Reviewed  COMPREHENSIVE METABOLIC PANEL - Abnormal; Notable for the following components:      Result Value   Sodium 132 (*)    Glucose, Bld 114 (*)    Creatinine, Ser 1.32 (*)    All other components within normal limits  CBC - Abnormal; Notable for the following components:   WBC 12.8 (*)    All other components within normal limits  URINALYSIS, ROUTINE W REFLEX MICROSCOPIC - Abnormal; Notable for the following components:   Specific Gravity, Urine >1.046 (*)    Protein, ur TRACE (*)    Bacteria, UA RARE (*)    All other components within normal limits  PROTIME-INR - Abnormal; Notable for the following components:   Prothrombin Time 19.4 (*)    INR 1.7 (*)    All other components within normal limits  LIPASE, BLOOD    EKG None  Radiology CT ABDOMEN PELVIS W CONTRAST  Result Date: 09/06/2022 CLINICAL DATA:  Right lower quadrant pain. EXAM: CT ABDOMEN AND PELVIS WITH CONTRAST TECHNIQUE: Multidetector CT imaging of the abdomen and pelvis was performed using the standard protocol following bolus administration of intravenous contrast. RADIATION DOSE REDUCTION: This exam was performed according to the departmental  dose-optimization program which includes automated exposure control, adjustment of the mA and/or kV according to patient size and/or use of iterative reconstruction technique. CONTRAST:  127m OMNIPAQUE IOHEXOL 300 MG/ML  SOLN COMPARISON:  None Available. FINDINGS: Lower chest: Mild, patchy posterior bibasilar atelectasis and/or infiltrate is seen. Hepatobiliary: There is diffuse fatty infiltration of the liver parenchyma. No focal liver abnormality is seen. Status post cholecystectomy. No biliary dilatation. Pancreas: Unremarkable. No pancreatic ductal dilatation or surrounding inflammatory changes. Spleen: Normal in size without focal abnormality. Adrenals/Urinary Tract: Adrenal glands are unremarkable. Kidneys are normal in size, without renal calculi or hydronephrosis. A subcentimeter simple cyst is seen within the right kidney. The  urinary bladder is poorly distended and subsequently limited in evaluation. Mild diffuse urinary bladder wall thickening is noted. Stomach/Bowel: Stomach is within normal limits. Appendix appears normal. No evidence of bowel dilatation. Markedly inflamed diverticula are seen within the mid sigmoid colon. There is no evidence of associated perforation or abscess. Vascular/Lymphatic: Aortic atherosclerosis. No enlarged abdominal or pelvic lymph nodes. Reproductive: Prostate is unremarkable. Other: No abdominal wall hernia or abnormality. No abdominopelvic ascites. Musculoskeletal: No acute osseous abnormalities are identified. IMPRESSION: 1. Acute sigmoid diverticulitis. 2. Mild, patchy posterior bibasilar atelectasis and/or infiltrate. 3. Hepatic steatosis. 4. Evidence of prior cholecystectomy. 5. Aortic atherosclerosis. 6. Mild diffuse urinary bladder wall thickening which may be, in part, secondary to poor bladder distention. Correlation with urinalysis is recommended to exclude the presence of cystitis. Aortic Atherosclerosis (ICD10-I70.0). Electronically Signed   By: Virgina Norfolk M.D.   On: 09/06/2022 23:57    Procedures Procedures    Medications Ordered in ED Medications  iohexol (OMNIPAQUE) 300 MG/ML solution 100 mL (100 mLs Intravenous Contrast Given 09/06/22 2341)  sodium chloride 0.9 % bolus 1,000 mL (0 mLs Intravenous Stopped 09/07/22 0116)  piperacillin-tazobactam (ZOSYN) IVPB 3.375 g (0 g Intravenous Stopped 09/07/22 0105)    ED Course/ Medical Decision Making/ A&P                           Medical Decision Making Amount and/or Complexity of Data Reviewed Labs: ordered. Radiology: ordered.  Risk Prescription drug management.   This patient presents to the ED for concern of abdominal pain, this involves an extensive number of treatment options, and is a complaint that carries with it a high risk of complications and morbidity.  I considered the following differential and admission for this acute, potentially life threatening condition.  The differential diagnosis includes appendicitis, colitis, diverticulitis, UTI  MDM:    This is a 51 year old male who presents with abdominal pain.  He is overall nontoxic on my evaluation.  Initial triage vital signs notable for temperature of 100.2 and a heart rate of 127.  Heart rate on my evaluation 105.  He has some tenderness without signs of peritonitis.  Labs obtained and reviewed.  He has a slight leukocytosis to 12.6.  Patient was given fluids.  He declined pain medication.  CT scan obtained.  CT scan shows evidence of acute uncomplicated diverticulitis.  This is likely the culprit.  No evidence of UTI.  Given that he has a history of lupus and antiphospholipid syndrome which puts him at high risk as well as the low-grade temperature of 100.2, patient was given 1 dose of Zosyn.  He continued to decline pain medication and was comfortable.  He was able to tolerate fluids.  Will discharge with Augmentin and pain medication.  He was given strict return precautions if he were to develop fever or worsening  pain.  (Labs, imaging, consults)  Labs: I Ordered, and personally interpreted labs.  The pertinent results include: CBC, CMP, lipase, urinalysis  Imaging Studies ordered: I ordered imaging studies including CT abdomen pelvis I independently visualized and interpreted imaging. I agree with the radiologist interpretation  Additional history obtained from chart review.  External records from outside source obtained and reviewed including prior evaluations  Cardiac Monitoring: The patient was maintained on a cardiac monitor.  I personally viewed and interpreted the cardiac monitored which showed an underlying rhythm of: Normal sinus rhythm  Reevaluation: After the interventions noted above, I reevaluated the patient  and found that they have :improved  Social Determinants of Health: Lives independently  Disposition: Discharge  Co morbidities that complicate the patient evaluation  Past Medical History:  Diagnosis Date   Acute nonintractable headache 09/06/2019   Acute respiratory failure with hypoxia ([place]) 08/30/2019   AKI (acute kidney injury) ([place]) 08/30/2019   Antiphospholipid syndrome ([facility]) 10/30/2019   Anxiety    APS (antiphospholipid syndrome) ([place])    Arthritis    Colon polyp    ? hyperplastic   Depression    Diarrhea 05/21/2020   DVT (deep venous thrombosis) ([name])    Dyspnea on exertion 09/08/2017   "Quit smoking" 2015 with onset of symptoms in 2016  Spirometry 09/08/2017  Flat f/v loop  - d/c acei 09/08/2017  - 01/04/2020   Walked RA x two laps =  approx 521f @ fast pace - stopped due to end of study/ min sob with sats of 93 % at the end of the study. - PFT's  03/08/20   FEV1 3.84 (83 % ) ratio 0..90  p 3 % improvement from saba p ? 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 06/26/20, 03/08/20   Snoring 07/09/2020   Stress fracture    in back, seeing PT   St[facility] ([facility]    Upper airway cough syndrome 01/05/2020   Onset mid Jan 2021 while on otc PPI  - max rx for gerd 01/04/2020 >>>      Witnessed apneic spells 07/09/2020     Medicines Meds ordered this encounter  Medications   iohexol (OMNIPAQUE) 300 MG/ML solution 100 mL   sodium chloride 0.9 % bolus 1,000 mL   piperacillin-tazobactam (ZOSYN) IVPB 3.375 g    Order Specific Question:   Antibiotic Indication:    Answer:   Intra-abdominal Infection   amoxicillin-clavulanate (AUGMENTIN) 875-125 MG tablet    Sig: Take 1 tablet by mouth every 12 (twelve) hours.    Dispense:  20 tablet    Refill:  0   oxyCODONE-acetaminophen (PERCOCET/ROXICET) 5-325 MG tablet    Sig: Take 1 tablet by mouth every 6 (six) hours as needed for severe pain.    Dispense:  10 tablet    Refill:  0    I have reviewed the patients home medicines and have made adjustments as needed  Problem List / ED Course: Problem List Items Addressed This Visit   None Visit Diagnoses     Diverticulitis    -  Primary                   Final Clinical Impression(s) / ED Diagnoses  Final diagnoses:  Diverticulitis    Rx / DC Orders ED Discharge Orders          Ordered     amoxicillin-clavulanate (AUGMENTIN) 875-125 MG tablet  Every 12 hours        09/07/22 0136    oxyCODONE-acetaminophen (PERCOCET/ROXICET) 5-325 MG tablet  Every 6 hours PRN        09/07/22 0136              Merryl Hacker, MD 09/07/22 [phone]

## 2022-09-07 NOTE — Discharge Instructions (Signed)
You were seen today for abdominal pain.  Your CT scan shows diverticulitis.  Take antibiotics as prescribed.  If you develop fever, worsening pain, any new or worsening symptoms, you should be reevaluated.

## 2022-09-09 ENCOUNTER — Ambulatory Visit (INDEPENDENT_AMBULATORY_CARE_PROVIDER_SITE_OTHER): Payer: BC Managed Care – PPO

## 2022-09-09 DIAGNOSIS — Z7901 Long term (current) use of anticoagulants: Secondary | ICD-10-CM | POA: Diagnosis not present

## 2022-09-09 LAB — POCT INR: INR: 2.3 (ref 2.0–3.0)

## 2022-09-09 NOTE — Progress Notes (Signed)
Pt was in ED on 10/8 for acute diverticulitis. Was prescribed a 10 day course of Augmentin 875-125 mg. INR today 2.3.  Increase dose today to 3 tablets (15 mg total) then continue 2 tablets daily except take 3 tablets on Mondays. Recheck in 2 weeks.   eks.

## 2022-09-09 NOTE — Patient Instructions (Signed)
Increase dose today to 3 tablets (15 mg total) then continue 2 tablets daily except take 3 tablets on Mondays. Recheck in 2 weeks.

## 2022-09-14 ENCOUNTER — Ambulatory Visit: Payer: BC Managed Care – PPO | Admitting: Physician Assistant

## 2022-09-14 ENCOUNTER — Encounter: Payer: Self-pay | Admitting: Physician Assistant

## 2022-09-14 VITALS — BP 146/90 | HR 93 | Temp 98.2°F | Ht 73.0 in | Wt 310.0 lb

## 2022-09-14 DIAGNOSIS — E88819 Insulin resistance, unspecified: Secondary | ICD-10-CM | POA: Diagnosis not present

## 2022-09-14 DIAGNOSIS — Z23 Encounter for immunization: Secondary | ICD-10-CM | POA: Diagnosis not present

## 2022-09-14 DIAGNOSIS — K5792 Diverticulitis of intestine, part unspecified, without perforation or abscess without bleeding: Secondary | ICD-10-CM

## 2022-09-14 DIAGNOSIS — R300 Dysuria: Secondary | ICD-10-CM

## 2022-09-14 DIAGNOSIS — Z125 Encounter for screening for malignant neoplasm of prostate: Secondary | ICD-10-CM | POA: Diagnosis not present

## 2022-09-14 DIAGNOSIS — N5312 Painful ejaculation: Secondary | ICD-10-CM

## 2022-09-14 LAB — HEMOGLOBIN A1C: Hgb A1c MFr Bld: 6.2 % (ref 4.6–6.5)

## 2022-09-14 LAB — PSA: PSA: 0.69 ng/mL (ref 0.10–4.00)

## 2022-09-14 MED ORDER — AMOXICILLIN-POT CLAVULANATE 875-125 MG PO TABS: 1.0000 | ORAL_TABLET | Freq: Two times a day (BID) | ORAL | 0 refills | Status: DC

## 2022-09-14 NOTE — Patient Instructions (Signed)
It was great to see you!  Continue the antibiotics  Restart liquid diet  Message me in a few days if any worsening  Take care,  Inda Coke PA-C

## 2022-09-14 NOTE — Progress Notes (Signed)
Wesley Hancock is a 51 y.o. male here for a follow up of a pre-existing problem.  History of Present Illness:   Chief Complaint  Patient presents with   Abdominal Pain    Pt c/o abdominal pain since ED visit on 10/8, pain worse the past 2 days. Also having diarrhea. Denies fever or chills. He is still on antibiotics.    HPI  Abdominal pain  Last Saturday he was experiencing gas and lower abdominal pain. Pain worsen overtime and he went to the emergency room on 09/06/2022.  He had a CT scan that showed diverticulitis.  He also had a slightly elevated white count of 12.8.  He had low-grade fever at that time.  He was given a dose of Zosyn in the ER and prescribed Augmentin for 10 days.  He went on a mostly clear liquid diet after this visit and has been taking antibiotics.  He recently advance his diet over the weekend and is having some worsening abdominal pain.  Symptoms are not worse compared to initial presenting symptoms to ER.    He has history of colonoscopy but no prior history of diverticulosis. he is having some intermittent diarrhea and gas at this point.  He had spaghetti last night.  He denies stools with blood.  He has seen [facility] in the past most recently August 2021 for chronic diarrhea and heartburn.  He was going to pursue EGD and colonoscopy but this has not yet been done.  He is compliant on his anticoagulation.  Dysuria CT of the abdomen and pelvis also showed mild diffuse urinary bladder wall thickening.  He does report that he feels an occasional burning sensation when urinating.  He also reports that during his last encounter of sexual activity this past week he had  pain with ejaculation.  He denies presence of blood in semen or urine.  He has a family history of prostate cancer in his father.  Insulin resistance He would like his A1c checked today.  He has a family history of diabetes.  He has been borderline in the past and has required multiple rounds of  prednisone over the past few years for his medical issues.  Past Medical History:  Diagnosis Date   Acute nonintractable headache 09/06/2019   Acute respiratory failure with hypoxia ([place]) 08/30/2019   AKI (acute kidney injury) ([place]) 08/30/2019   Antiphospholipid syndrome ([place]) 10/30/2019   Anxiety    APS (antiphospholipid syndrome) ([place])    Arthritis    Colon polyp    ? hyperplastic   Depression    Diarrhea 05/21/2020   DVT (deep venous thrombosis) ([name])    Dyspnea on exertion 09/08/2017   "Quit smoking" 2015 with onset of symptoms in 2016  Spirometry 09/08/2017  Flat f/v loop  - d/c acei 09/08/2017  - 01/04/2020   Walked RA x two laps =  approx 528f @ fast pace - stopped due to end of study/ min sob with sats of 93 % at the end of the study. - PFT's  03/08/20   FEV1 3.84 (83 % ) ratio 0..90  p 3 % improvement from saba p ? 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 06/26/20, 03/08/20   Snoring 07/09/2020   Stress fracture    in back, seeing PT   Stroke ([place])    Upper airway cough syndrome 01/05/2020   Onset mid Jan 2021 while on otc PPI  - max rx for  gerd 01/04/2020 >>>      Witnessed apneic spells 07/09/2020     Social History   Tobacco Use   Smoking status: Former    Packs/day: 1.00    Years: 20.00    Total pack years: 20.00    Types: Cigarettes    Quit date: 11/30/2013    Years since quitting: 8.7   Smokeless tobacco: Former    Types: Snuff    Quit date: 05/21/2018  Vaping Use   Vaping Use: Never used  Substance Use Topics   Alcohol use: Not Currently   Drug use: No    Past Surgical History:  Procedure Laterality Date   BRONCHIAL WASHINGS  01/14/2021   Procedure: BRONCHIAL WASHINGS;  Surgeon: Freddi Starr, MD;  Location: [facility];  Service: Pulmonary;;   CHOLECYSTECTOMY     VIDEO BRONCHOSCOPY N/A 01/14/2021   Procedure: VIDEO BRONCHOSCOPY WITHOUT FLUORO;  Surgeon: Freddi Starr, MD;  Location: [facility];  Service: Pulmonary;  Laterality: N/A;    Family History  Problem Relation Age of Onset   Diabetes Mother    Hyperlipidemia Mother    Hypertension Mother    Thyroid disease Mother        uncertain type--had surg--no cancer   Asthma Mother        died from PNA   Heart disease Father 52   Hyperlipidemia Father    Hypertension Father    Prostate cancer Father 36   Lung cancer Father 78       Dx 06/18/2017   Colon polyps Father    Irritable bowel syndrome Father    Diverticulitis Father    Diabetes Sister    Hyperlipidemia Sister    Hypertension Sister    Diabetes Maternal Grandmother    Heart disease Maternal Grandmother    Hyperlipidemia Maternal Grandmother    Hypertension Maternal Grandmother    Colon cancer Maternal Grandmother    Heart disease Maternal Grandfather    Hyperlipidemia Maternal Grandfather    Hypertension Maternal Grandfather    Stroke Maternal Grandfather    Liver cancer Maternal Grandfather    Irritable bowel syndrome Maternal Grandfather    Heart disease Paternal Grandmother    Healthy Daughter    Healthy Daughter    Healthy Son     Allergies  Allergen  Reactions   Diltiazem Hcl Diarrhea and Other (See Comments)    Lethargic     Current Medications:   Current Outpatient Medications:    acetaminophen (TYLENOL) 500 MG tablet, Take 1,000 mg by mouth every 6 (six) hours as needed for mild pain., Disp: , Rfl:    albuterol (VENTOLIN HFA) 108 (  90 Base) MCG/ACT inhaler, Inhale 2 puffs into the lungs every 6 (six) hours as needed for wheezing or shortness of breath., Disp: 8 g, Rfl: 6   amoxicillin-clavulanate (AUGMENTIN) 875-125 MG tablet, Take 1 tablet by mouth 2 (two) times daily., Disp: 28 tablet, Rfl: 0   atorvastatin (LIPITOR) 40 MG tablet, Take 1 tablet (40 mg total) by mouth daily., Disp: 30 tablet, Rfl: 1   buPROPion (WELLBUTRIN) 75 MG tablet, Take 1 tablet (75 mg total) by mouth 2 (two) times daily., Disp: 270 tablet, Rfl: 0   escitalopram (LEXAPRO) 20 MG tablet, Take 1 tablet (20 mg total) by mouth daily., Disp: 90 tablet, Rfl: 1   fluticasone (FLONASE) 50 MCG/ACT nasal spray, Place 1 spray into both nostrils daily as needed for allergies., Disp: , Rfl:    hydrochlorothiazide (HYDRODIURIL) 25 MG tablet, Take 1 tablet (25 mg total) by mouth daily., Disp: 90 tablet, Rfl: 1   Multiple Vitamin (MULTIVITAMIN) tablet, Take by mouth daily. 2 chews daily, Disp: , Rfl:    mycophenolate (CELLCEPT) 500 MG tablet, Take 2 tablets (1,000 mg total) by mouth 2 (two) times daily., Disp: 360 tablet, Rfl: 1   pantoprazole (PROTONIX) 40 MG tablet, Take 1 tablet (40 mg total) by mouth daily., Disp: 90 tablet, Rfl: 3   telmisartan (MICARDIS) 80 MG tablet, Take 1 tablet (80 mg total) by mouth daily., Disp: 90 tablet, Rfl: 1   triamcinolone cream (KENALOG) 0.1 %, Apply to affected area 1-2 times daily, Disp: 30 g, Rfl: 0   warfarin (COUMADIN) 5 MG tablet, TAKE 2 TABLETS DAILY BY MOUTH EXCEPT TAKE 3 TABLETS ON MONDAYS, WEDNESDAYS AND FRIDAYS OR AS DIRECTED BY ANTICOAGULATION CLINIC., Disp: 200 tablet, Rfl: 1   Review of Systems:   Review of Systems   Gastrointestinal:  Positive for abdominal pain (tenderness in lower abdomen) and diarrhea.       (+) gas  .[email or web address]   Vitals:   Vitals:   09/14/22 1119 09/14/22 1207  BP: (!) 140/80 (!) 146/90  Pulse: 93 93  Temp: 98.2 F (36.8 C)   TempSrc: Temporal   SpO2: 96%   Weight: (!) 310 lb (140.6 kg)   Height: '6\' 1"'$  (1.854 m)      Body mass index is 40.9 kg/m.  Physical Exam:   Physical Exam Constitutional:      General: He is not in acute distress.    Appearance: Normal appearance. He is not ill-appearing.  HENT:     Head: Normocephalic and atraumatic.     Right Ear: External ear normal.     Left Ear: External ear normal.  Eyes:     Extraocular Movements: Extraocular movements intact.     Pupils: Pupils are equal, round, and reactive to light.  Cardiovascular:     Rate and Rhythm: Normal rate and regular rhythm.     Heart sounds: Normal heart sounds. No murmur heard.    No gallop.  Pulmonary:     Effort: Pulmonary effort is normal. No respiratory distress.     Breath sounds: Normal breath sounds. No wheezing or rales.  Abdominal:     General: Abdomen is flat. Bowel sounds are normal.     Palpations: Abdomen is soft.     Tenderness: There is abdominal tenderness in the suprapubic area and left lower quadrant. There is no right CVA tenderness, left CVA tenderness, guarding or rebound.  Skin:    General: Skin is warm and dry.  Neurological:     Mental Status: He  is alert and oriented to person, place, and time.  Psychiatric:        Judgment: Judgment normal.     Assessment and Plan:   Diverticulitis No significant red flags on exam He does still have abdominal tenderness but I do think this may be related to him advancing his diet --no evidence of acute abdomen or peritoneal signs on my exam and his vitals are reassuring We will have him extend his Augmentin course and return to clear liquid diet I have also placed a referral to follow-up with gastroenterology for  these issues We did discuss repeating CT scan however working to give his diet downgraded and extended antibiotics a few days to see if this improves his symptoms, however if it does not we will order CT abdomen pelvis to repeat imaging or if he develops any new symptoms such as fever, vomiting, etc. If any severe symptoms patient was instructed to go to the ER  Dysuria We will update a urine culture for further evaluation  Prostate cancer screening; Painful ejaculation Update PSA today Discussed that if painful ejaculation occurs again will refer to urology  Insulin resistance Update hemoglobin A1c and provide recommendations accordingly  Need for immunization against influenza Update flu vaccine today  I, Luna Glasgow, acting as a Education administrator for [employer], PA.,have documented all relevant documentation on the behalf of Inda Coke, PA,as directed by  Inda Coke, PA while in the presence of Inda Coke, [place].  I, Inda Coke, [place], have reviewed all documentation for this visit. The documentation on 09/14/22 for the exam, diagnosis, procedures, and orders are all accurate and complete.   Inda Coke, PA-C

## 2022-09-15 ENCOUNTER — Encounter: Payer: Self-pay | Admitting: Physician Assistant

## 2022-09-15 LAB — URINE CULTURE
MICRO NUMBER:: 14055484
Result:: NO GROWTH
SPECIMEN QUALITY:: ADEQUATE

## 2022-09-15 NOTE — Telephone Encounter (Signed)
Okay for excuse from work?

## 2022-09-21 ENCOUNTER — Encounter (HOSPITAL_BASED_OUTPATIENT_CLINIC_OR_DEPARTMENT_OTHER): Payer: Self-pay | Admitting: Physical Therapy

## 2022-09-23 ENCOUNTER — Ambulatory Visit (INDEPENDENT_AMBULATORY_CARE_PROVIDER_SITE_OTHER): Payer: BC Managed Care – PPO

## 2022-09-23 DIAGNOSIS — Z7901 Long term (current) use of anticoagulants: Secondary | ICD-10-CM | POA: Diagnosis not present

## 2022-09-23 LAB — POCT INR: INR: 3.4 — AB (ref 2.0–3.0)

## 2022-09-23 NOTE — Patient Instructions (Signed)
Continue 2 tablets daily except take 3 tablets on Mondays. Recheck in 4 weeks.

## 2022-09-23 NOTE — Progress Notes (Signed)
Pt was in ED on 10/8 for acute diverticulitis. Was prescribed course of Augmentin 875-125 mg, took last dose yesterday.   Continue 2 tablets daily except take 3 tablets on Mondays. Recheck in 4 weeks.

## 2022-09-28 ENCOUNTER — Encounter: Payer: BC Managed Care – PPO | Admitting: Family Medicine

## 2022-09-28 NOTE — Progress Notes (Deleted)
   Wesley Hancock is a 51 y.o. male who presents today for an office visit.  He is transferring care to [place] for primary care.   Assessment/Plan:  New/Acute Problems: ***  Chronic Problems Addressed Today: No problem-specific Assessment & Plan notes found for this encounter.     Subjective:  HPI:  ***        Objective:  Physical Exam: There were no vitals taken for this visit.  Gen: No acute distress, resting comfortably*** CV: Regular rate and rhythm with no murmurs appreciated Pulm: Normal work of breathing, clear to auscultation bilaterally with no crackles, wheezes, or rhonchi Neuro: Grossly normal, moves all extremities Psych: Normal affect and thought content      Terrisa Curfman M. Jerline Pain, MD 09/28/2022 7:39 AM

## 2022-09-29 NOTE — Progress Notes (Signed)
Cardiology Office Note:    Date:  09/30/2022   ID:  Cleaster Corin, DOB 1971-02-01, MRN [id number]  PCP:  Jarold Motto, PA   [facility] Providers Cardiologist:  Norman Herrlich, MD     Referring MD: Jarold Motto, [place]   Chief Complaint: coronary artery disease  History of Present Illness:    Kennon Semedo is a very pleasant 51 y.o. male with a hx of PE on chronic anticoagulation, HTN, HLD, coronary artery calcification seen on CT, TIA, severe sleep apnea on CPAP, stroke  History of pulmonary embolism maintained on long-term anticoagulation with antiphospholipid syndrome and protein S deficiency. Initially seen 07/12/2020 for shortness of breath.  Had multifocal pneumonia and persistent infiltrates on x-ray.  CT showed coronary artery calcification.  Report of myocardial perfusion test 07/31/2020 showing EF normal 59% and normal perfusion.  Echo 07/31/2020 showed EF 60 to 65%, mild concentric LVH, normal diastolic function  Last cardiology clinic visit was 10/14/2020 by Dr. Dulce Sellar at which time he was advised to follow-up as needed. CTA chest 05/11/22 revealed coronary artery disease and no evidence of PE.   Today, he is here for follow-up. Lost his insurance and has not had follow-up in several years.  Was told he has coronary artery disease from prior CT at least 2 years ago. There is notation of coronary artery disease on CT 2021, reports he was told he has disease in LAD. Has left-sided chest/left shoulder pain that does not worsen with exertion. Pain occurs intermittently and once it comes on lasts for a while. Thinks he might have nausea associated with CP. Feels "panicky" when this occurs and HR speeds up. Has dyspnea but also has history of ILD 2/2 lupus. No orthopnea, PND, edema. Wears compression stockings at work.  Is currently walking 8-10 miles per day at work and recently improved his diet to avoid red meat and other processed foods, eats lots of vegetables 2/2 first  bout of diverticulitis. Compliant with CPAP. Lightheadedness when bending over and standing back up. Hydrates well. Previously on Praluent, became financial burden. Asks about getting 3 month supply.     Past Medical History:  Diagnosis Date   Acute nonintractable headache 09/06/2019   Acute respiratory failure with hypoxia ([name]) 08/30/2019   AKI (acute kidney injury) ([name]) 08/30/2019   Antiphospholipid syndrome ([name]) 10/30/2019   Anxiety    APS (antiphospholipid syndrome) ([name])    Arthritis    Colon polyp    ? hyperplastic   Depression    Diarrhea 05/21/2020   DVT (deep venous thrombosis) ([name])    Dyspnea on exertion 09/08/2017   "Quit smoking" 2015 with onset of symptoms in 2016  Spirometry 09/08/2017  Flat f/v loop  - d/c acei 09/08/2017  - 01/04/2020   Walked RA x two laps =  approx 537ft @ fast pace - stopped due to end of study/ min sob with sats of 93 % at the end of the study. - PFT's  03/08/20   FEV1 3.84 (83 % ) ratio 0..90  p 3 % improvement from saba p ? 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 06/26/20, 03/08/20   Snoring 07/09/2020   Stress  fracture    in back, seeing PT   Stroke ([name])    Upper airway cough syndrome 01/05/2020   Onset mid Jan 2021 while on otc PPI  - max rx for gerd 01/04/2020 >>>      Witnessed apneic spells 07/09/2020    Past Surgical History:  Procedure Laterality Date   BRONCHIAL WASHINGS  01/14/2021   Procedure: BRONCHIAL WASHINGS;  Surgeon: Martina Sinner, MD;  Location: [facility];  Service: Pulmonary;;   CHOLECYSTECTOMY     VIDEO BRONCHOSCOPY N/A 01/14/2021   Procedure: VIDEO BRONCHOSCOPY WITHOUT FLUORO;  Surgeon: Martina Sinner, MD;  Location: [facility];  Service: Pulmonary;  Laterality: N/A;    Current Medications: Current Meds  Medication Sig   acetaminophen (TYLENOL) 500 MG tablet Take 1,000 mg by mouth every 6 (six) hours as needed for mild pain.   albuterol (VENTOLIN HFA) 108 (90 Base) MCG/ACT inhaler Inhale 2 puffs into the lungs every 6 (six) hours as needed for wheezing or shortness of breath.   atorvastatin (LIPITOR) 40 MG tablet Take 1 tablet (40 mg total) by mouth daily.   buPROPion (WELLBUTRIN) 75 MG tablet Take 1 tablet (75 mg total) by mouth 2 (two) times daily.   escitalopram (LEXAPRO) 20 MG tablet Take 1 tablet (20 mg total) by mouth daily.   fluticasone (FLONASE) 50 MCG/ACT nasal spray Place 1 spray into both nostrils daily as needed for allergies.   metoprolol tartrate (LOPRESSOR) 100 MG tablet Take one (1) tablet by mouth ( 100 mg ) two hours prior to CT scan.   Multiple Vitamin (MULTIVITAMIN) tablet Take by mouth daily. 2 chews daily   mycophenolate (CELLCEPT) 500 MG tablet Take 2 tablets (1,000 mg total) by mouth 2 (two) times daily.   pantoprazole (PROTONIX) 40 MG tablet Take 1 tablet (40 mg total) by mouth daily.   triamcinolone cream (KENALOG) 0.1 % Apply to affected area 1-2 times daily   warfarin (COUMADIN) 5 MG tablet TAKE 2 TABLETS DAILY BY MOUTH EXCEPT TAKE 3 TABLETS ON MONDAYS, WEDNESDAYS AND FRIDAYS OR AS DIRECTED BY ANTICOAGULATION CLINIC.    [DISCONTINUED] hydrochlorothiazide (HYDRODIURIL) 25 MG tablet Take 1 tablet (25 mg total) by mouth daily.   [DISCONTINUED] telmisartan (MICARDIS) 80 MG tablet Take 1 tablet (80 mg total) by mouth daily.     Allergies:   Diltiazem hcl   Social History   Socioeconomic History   Marital status: Married    Spouse name: Lauren   Number of children: 3   Years of education: Not on file   Highest education level: Not on file  Occupational History   Occupation: Truck driver  Tobacco Use   Smoking status: Former    Packs/day: 1.00    Years: 20.00    Total pack years: 20.00    Types: Cigarettes    Quit date: 11/30/2013    Years since quitting: 8.8   Smokeless tobacco: Former  Types: Snuff    Quit date: 05/21/2018  Vaping Use   Vaping Use: Never used  Substance and Sexual Activity   Alcohol use: Not Currently   Drug use: No   Sexual activity: Yes  Other Topics Concern   Not on file  Social History Narrative   Lives with wife and daughter at home   Right Handed   Drinks about 2 cups caffeine daily   Social Determinants of Health   Financial Resource Strain: Not on file  Food Insecurity: Not on file  Transportation Needs: Not on file  Physical Activity: Not on file  Stress: Not on file  Social Connections: Not on file     Family History: The patient's family history includes Asthma in his mother; Colon cancer in his maternal grandmother; Colon polyps in his father; Diabetes in his maternal grandmother, mother, and sister; Diverticulitis in his father; Healthy in his daughter, daughter, and son; Heart disease in his maternal grandfather, maternal grandmother, and paternal grandmother; Heart disease (age of onset: 33) in his father; Hyperlipidemia in his father, maternal grandfather, maternal grandmother, mother, and sister; Hypertension in his father, maternal grandfather, maternal grandmother, mother, and sister; Irritable bowel syndrome in his father and maternal grandfather;  Liver cancer in his maternal grandfather; Lung cancer (age of onset: 51) in his father; Prostate cancer (age of onset: 36) in his father; Stroke in his maternal grandfather; Thyroid disease in his mother.  ROS:   Please see the history of present illness.    + chest pain + elevated BP All other systems reviewed and are negative.  Labs/Other Studies Reviewed:    The following studies were reviewed today:  Echo Bubble Study 03/22/21  1. Left ventricular ejection fraction, by estimation, is 55 to 60%. The  left ventricle has normal function. The left ventricle has no regional  wall motion abnormalities. There is mild left ventricular hypertrophy.  Left ventricular diastolic parameters  are indeterminate.   2. Right ventricular systolic function is normal. The right ventricular  size is normal. Tricuspid regurgitation signal is inadequate for assessing  PA pressure.   3. The mitral valve is normal in structure. No evidence of mitral valve  regurgitation.   4. The aortic valve is tricuspid. Aortic valve regurgitation is not  visualized. No aortic stenosis is present.   5. The inferior vena cava is normal in size with greater than 50%  respiratory variability, suggesting right atrial pressure of 3 mmHg.   6. Agitated saline contrast bubble study was negative, with no evidence  of any interatrial shunt.   Lexiscan Myoview 08/01/20  The left ventricular ejection fraction is normal (55-65%). Nuclear stress EF: 59%. There were no wall motion abnormalities There was no ST segment deviation noted during stress. The study is normal. This is a low risk study. No perfusion defects.  Recent Labs: 09/06/2022: ALT 37; BUN 20; Creatinine, Ser 1.32; Hemoglobin 15.6; Platelets 272; Potassium 3.6; Sodium 132  Recent Lipid Panel    Component Value Date/Time   CHOL 280 (H) 06/12/2021 1051   TRIG 148 06/12/2021 1051   HDL 45 06/12/2021 1051   CHOLHDL 6.2 (H) 06/12/2021 1051   CHOLHDL 6.4  03/21/2021 0550   VLDL 40 03/21/2021 0550   LDLCALC 207 (H) 06/12/2021 1051     Risk Assessment/Calculations:       Physical Exam:    VS:  BP (!) 144/98   Pulse 86   Ht 6' 1.5" (1.867 m)   Wt (!) 310 lb (  140.6 kg)   SpO2 95%   BMI 40.35 kg/m     Wt Readings from Last 3 Encounters:  09/30/22 (!) 310 lb (140.6 kg)  09/14/22 (!) 310 lb (140.6 kg)  09/06/22 300 lb (136.1 kg)     GEN: Well nourished, well developed in no acute distress HEENT: Normal NECK: No JVD; No carotid bruits CARDIAC: RRR, no murmurs, rubs, gallops RESPIRATORY:  Clear to auscultation without rales, wheezing or rhonchi  ABDOMEN: Soft, non-tender, non-distended MUSCULOSKELETAL:  No edema; No deformity. 2+ pedal pulses, equal bilaterally SKIN: Warm and dry NEUROLOGIC:  Alert and oriented x 3 PSYCHIATRIC:  Normal affect   EKG:  EKG is not ordered today.   HYPERTENSION CONTROL Vitals:   09/30/22 0759 09/30/22 0831  BP: (!) 132/90 (!) 144/98    The patient's blood pressure is elevated above target today.  In order to address the patient's elevated BP: Blood pressure will be monitored at home to determine if medication changes need to be made. (did not take morning anti-hypertensives)     Diagnoses:    1. Coronary artery calcification seen on CAT scan   2. Chest pain of uncertain etiology   3. Hyperlipidemia LDL goal <70   4. APS (antiphospholipid syndrome) ([name])   5. History of stroke   6. Essential hypertension   7. Hypercoagulable state ([name])    Assessment and Plan:     Chest pain: Having left-sided chest pain associated with nausea. Has DOE that he feels has been stable, also has hx of ILD. Pain somewhat atypical for angina as it does not intensify with exertion, however with prior history of coronary artery calcification would like to get coronary CTA to evaluate for coronary artery stenosis.  We will have him take Lopressor 100 mg prior to CT.  Coronary artery calcification seen on CT:  Prior documentation of coronary artery calcification on CT. No prior documentation of coronary calcium score.  As noted above, we are getting coronary CTA for mapping of coronary anatomy in the setting of chest pain. Will treat hyperlipidemia for LDL goal < 70.  Encouraged continued heart healthy, mostly plant based diet and 150 minutes moderate intensity exercise each week.   History of stroke/Hypercoagulable state: Presented 03/20/2021 with sudden onset speech difficulty. MRI showed acute infarct in left frontal lobe and insula region.  CTA of head and neck showed irregularity of superior division MCA with distal occlusion, CT perfusion showed no significant penumbra. No cardiac sourc of emboli on TEE. On Coumadin, management by PCP. No bleeding concerns. Management per neurology/PCP.   Hyperlipidemia LDL goal < 70: LDL 207 on 1/61/09.  Previously on Praluent but cost prohibitive. Would like to potentially restart this medication now that he has insurance. We will check lipid panel today. Continue atorvastatin.  Hypertension: BP is elevated today and remains elevated on my recheck.  He did not take medications prior to arrival today, ran out a few days ago. I have asked him to monitor at home and report back to [place] in 1 week so that medication adjustments can be made if BP remains elevated.  We will refill his medications today.     Disposition: 2 months with me  Medication Adjustments/Labs and Tests Ordered: Current medicines are reviewed at length with the patient today.  Concerns regarding medicines are outlined above.  Orders Placed This Encounter  Procedures   CT CORONARY MORPH W/CTA COR W/SCORE W/CA W/CM &/OR WO/CM   Comp Met (CMET)   Lipid Profile  Meds ordered this encounter  Medications   hydrochlorothiazide (HYDRODIURIL) 25 MG tablet    Sig: Take 1 tablet (25 mg total) by mouth daily.    Dispense:  90 tablet    Refill:  3   telmisartan (MICARDIS) 80 MG tablet    Sig: Take 1  tablet (80 mg total) by mouth daily.    Dispense:  90 tablet    Refill:  3   metoprolol tartrate (LOPRESSOR) 100 MG tablet    Sig: Take one (1) tablet by mouth ( 100 mg ) two hours prior to CT scan.    Dispense:  1 tablet    Refill:  0    Patient Instructions  Medication Instructions:   Your physician recommends that you continue on your current medications as directed. Please refer to the Current Medication list given to you today.   *If you need a refill on your cardiac medications before your next appointment, please call your pharmacy*   Lab Work:  TODAY!!!! CMET/LIPID.  If you have labs (blood work) drawn today and your tests are completely normal, you will receive your results only by: MyChart Message (if you have MyChart) OR A paper copy in the mail If you have any lab test that is abnormal or we need to change your treatment, we will call you to review the results.   Testing/Procedures:    Your cardiac CT will be scheduled at one of the below locations:   [facility] [address] [place], [place] [address] [phone]  If scheduled at [facility], please arrive at the [facility] and Children's Entrance (Entrance C2) of [facility] 30 minutes prior to test start time. You can use the FREE valet parking offered at entrance C (encouraged to control the heart rate for the test)  Proceed to the [facility] Radiology Department (first floor) to check-in and test prep.  All radiology patients and guests should use entrance C2 at [facility], accessed from [facility], even though the hospital's physical address listed is [address].    Please follow these instructions carefully (unless otherwise directed):  Hold all erectile dysfunction medications at least 3 days (72 hrs) prior to test. (Ie viagra, cialis, sildenafil, tadalafil, etc) We will administer nitroglycerin during this exam.   On the Night  Before the Test: Be sure to Drink plenty of water. Do not consume any caffeinated/decaffeinated beverages or chocolate 12 hours prior to your test. Do not take any antihistamines 12 hours prior to your test. On the Day of the Test: Drink plenty of water until 1 hour prior to the test. Do not eat any food 1 hour prior to test. You may take your regular medications prior to the test.  Take metoprolol (Lopressor)BY MOUTH ( 100 MG)  two hours prior to test. HOLD Hydrochlorothiazide morning of the test.       After the Test: Drink plenty of water. After receiving IV contrast, you may experience a mild flushed feeling. This is normal. On occasion, you may experience a mild rash up to 24 hours after the test. This is not dangerous. If this occurs, you can take Benadryl 25 mg and increase your fluid intake. If you experience trouble breathing, this can be serious. If it is severe call 911 IMMEDIATELY. If it is mild, please call our office.  We will call to schedule your test 2-4 weeks out understanding that some insurance companies will need an authorization prior  to the service being performed.   For non-scheduling related questions, please contact the cardiac imaging nurse navigator should you have any questions/concerns: Rockwell Alexandria, Cardiac Imaging Nurse Navigator Larey Brick, Cardiac Imaging Nurse Navigator [facility] Direct Office Dial: [phone]   For scheduling needs, including cancellations and rescheduling, please call [place], [phone].    Follow-Up: At [facility], you and your health needs are our priority.  As part of our continuing mission to provide you with exceptional heart care, we have created designated Provider Care Teams.  These Care Teams include your primary Cardiologist (physician) and Advanced Practice Providers (APPs -  Physician Assistants and Nurse Practitioners) who all work together to provide you with the care  you need, when you need it.  We recommend signing up for the patient portal called "MyChart".  Sign up information is provided on this After Visit Summary.  MyChart is used to connect with patients for Virtual Visits (Telemedicine).  Patients are able to view lab/test results, encounter notes, upcoming appointments, etc.  Non-urgent messages can be sent to your provider as well.   To learn more about what you can do with MyChart, go to [email or web address].    Your next appointment:   2 month(s)  The format for your next appointment:   In Person  Provider:   Eligha Bridegroom, NP         Other Instructions  HOW TO TAKE YOUR BLOOD PRESSURE: Rest 5 minutes before taking your blood pressure.  Don't smoke or drink caffeinated beverages for at least 30 minutes before. Take your blood pressure before (not after) you eat. Sit comfortably with your back supported and both feet on the floor (don't cross your legs). Elevate your arm to heart level on a table or a desk. Use the proper sized cuff. It should fit smoothly and snugly around your bare upper arm. There should be enough room to slip a fingertip under the cuff. The bottom edge of the cuff should be 1 inch above the crease of the elbow. Please monitor your blood pressure call our office IN ONE WEEK at [phone] or send a MyChart message   Important Information About Sugar         Signed, Levi Aland, NP  09/30/2022 9:32 AM    [facility]

## 2022-09-30 ENCOUNTER — Ambulatory Visit: Payer: BC Managed Care – PPO | Attending: Nurse Practitioner | Admitting: Nurse Practitioner

## 2022-09-30 ENCOUNTER — Encounter: Payer: Self-pay | Admitting: Nurse Practitioner

## 2022-09-30 VITALS — BP 144/98 | HR 86 | Ht 73.5 in | Wt 310.0 lb

## 2022-09-30 DIAGNOSIS — I10 Essential (primary) hypertension: Secondary | ICD-10-CM

## 2022-09-30 DIAGNOSIS — I2510 Atherosclerotic heart disease of native coronary artery without angina pectoris: Secondary | ICD-10-CM

## 2022-09-30 DIAGNOSIS — D6861 Antiphospholipid syndrome: Secondary | ICD-10-CM

## 2022-09-30 DIAGNOSIS — R079 Chest pain, unspecified: Secondary | ICD-10-CM

## 2022-09-30 DIAGNOSIS — Z8673 Personal history of transient ischemic attack (TIA), and cerebral infarction without residual deficits: Secondary | ICD-10-CM

## 2022-09-30 DIAGNOSIS — D6859 Other primary thrombophilia: Secondary | ICD-10-CM

## 2022-09-30 DIAGNOSIS — E785 Hyperlipidemia, unspecified: Secondary | ICD-10-CM | POA: Diagnosis not present

## 2022-09-30 LAB — LIPID PANEL
Chol/HDL Ratio: 5.9 ratio — ABNORMAL HIGH (ref 0.0–5.0)
Cholesterol, Total: 212 mg/dL — ABNORMAL HIGH (ref 100–199)
HDL: 36 mg/dL — ABNORMAL LOW (ref >39)
LDL Chol Calc (NIH): 147 mg/dL — ABNORMAL HIGH (ref 0–99)
Triglycerides: 158 mg/dL — ABNORMAL HIGH (ref 0–149)
VLDL Cholesterol Cal: 29 mg/dL (ref 5–40)

## 2022-09-30 LAB — COMPREHENSIVE METABOLIC PANEL
ALT: 46 IU/L — ABNORMAL HIGH (ref 0–44)
AST: 35 IU/L (ref 0–40)
Albumin/Globulin Ratio: 1.1 — ABNORMAL LOW (ref 1.2–2.2)
Albumin: 4 g/dL — ABNORMAL LOW (ref 4.1–5.1)
Alkaline Phosphatase: 70 IU/L (ref 44–121)
BUN/Creatinine Ratio: 13 (ref 9–20)
BUN: 13 mg/dL (ref 6–24)
Bilirubin Total: 0.8 mg/dL (ref 0.0–1.2)
CO2: 26 mmol/L (ref 20–29)
Calcium: 9.5 mg/dL (ref 8.7–10.2)
Chloride: 103 mmol/L (ref 96–106)
Creatinine, Ser: 0.97 mg/dL (ref 0.76–1.27)
Globulin, Total: 3.5 g/dL (ref 1.5–4.5)
Glucose: 105 mg/dL — ABNORMAL HIGH (ref 70–99)
Potassium: 4.1 mmol/L (ref 3.5–5.2)
Sodium: 139 mmol/L (ref 134–144)
Total Protein: 7.5 g/dL (ref 6.0–8.5)
eGFR: 95 mL/min/{1.73_m2} (ref >59)

## 2022-09-30 MED ORDER — TELMISARTAN 80 MG PO TABS
80.0000 mg | ORAL_TABLET | Freq: Every day | ORAL | 3 refills | Status: DC
Start: 2022-09-30 — End: 2023-10-11

## 2022-09-30 MED ORDER — METOPROLOL TARTRATE 100 MG PO TABS: ORAL_TABLET | ORAL | 0 refills | Status: DC

## 2022-09-30 MED ORDER — HYDROCHLOROTHIAZIDE 25 MG PO TABS
25.0000 mg | ORAL_TABLET | Freq: Every day | ORAL | 3 refills | Status: DC
Start: 2022-09-30 — End: 2023-07-09

## 2022-09-30 NOTE — Patient Instructions (Signed)
Medication Instructions:   Your physician recommends that you continue on your current medications as directed. Please refer to the Current Medication list given to you today.   *If you need a refill on your cardiac medications before your next appointment, please call your pharmacy*   Lab Work:  TODAY!!!! CMET/LIPID.  If you have labs (blood work) drawn today and your tests are completely normal, you will receive your results only by: [place] (if you have MyChart) OR A paper copy in the mail If you have any lab test that is abnormal or we need to change your treatment, we will call you to review the results.   Testing/Procedures:    Your cardiac CT will be scheduled at one of the below locations:   [facility] [address] [place], [place] [address] [phone]  If scheduled at [facility], please arrive at the [facility] and Children's Entrance (Entrance C2) of [facility] 30 minutes prior to test start time. You can use the FREE valet parking offered at entrance C (encouraged to control the heart rate for the test)  Proceed to the [facility] Radiology Department (first floor) to check-in and test prep.  All radiology patients and guests should use entrance C2 at [facility], accessed from [facility], even though the hospital's physical address listed is [address].    Please follow these instructions carefully (unless otherwise directed):  Hold all erectile dysfunction medications at least 3 days (72 hrs) prior to test. (Ie viagra, cialis, sildenafil, tadalafil, etc) We will administer nitroglycerin during this exam.   On the Night Before the Test: Be sure to Drink plenty of water. Do not consume any caffeinated/decaffeinated beverages or chocolate 12 hours prior to your test. Do not take any antihistamines 12 hours prior to your test. On the Day of the Test: Drink plenty of water until 1 hour  prior to the test. Do not eat any food 1 hour prior to test. You may take your regular medications prior to the test.  Take metoprolol (Lopressor)BY MOUTH ( 100 MG)  two hours prior to test. HOLD Hydrochlorothiazide morning of the test.       After the Test: Drink plenty of water. After receiving IV contrast, you may experience a mild flushed feeling. This is normal. On occasion, you may experience a mild rash up to 24 hours after the test. This is not dangerous. If this occurs, you can take Benadryl 25 mg and increase your fluid intake. If you experience trouble breathing, this can be serious. If it is severe call 911 IMMEDIATELY. If it is mild, please call our office.  We will call to schedule your test 2-4 weeks out understanding that some insurance companies will need an authorization prior to the service being performed.   For non-scheduling related questions, please contact the cardiac imaging nurse navigator should you have any questions/concerns: Marchia Bond, Cardiac Imaging Nurse Navigator Gordy Clement, Cardiac Imaging Nurse Navigator [facility] Direct Office Dial: [phone]   For scheduling needs, including cancellations and rescheduling, please call [place], [phone].    Follow-Up: At [facility], you and your health needs are our priority.  As part of our continuing mission to provide you with exceptional heart care, we have created designated Provider Care Teams.  These Care Teams include your primary Cardiologist (physician) and Advanced Practice Providers (APPs -  Physician Assistants and Nurse Practitioners) who all work together  to provide you with the care you need, when you need it.  We recommend signing up for the patient portal called "MyChart".  Sign up information is provided on this After Visit Summary.  MyChart is used to connect with patients for Virtual Visits (Telemedicine).  Patients are able to view lab/test  results, encounter notes, upcoming appointments, etc.  Non-urgent messages can be sent to your provider as well.   To learn more about what you can do with MyChart, go to [email or web address].    Your next appointment:   2 month(s)  The format for your next appointment:   In Person  Provider:   Christen Bame, NP         Other Instructions  HOW TO TAKE YOUR BLOOD PRESSURE: Rest 5 minutes before taking your blood pressure.  Don't smoke or drink caffeinated beverages for at least 30 minutes before. Take your blood pressure before (not after) you eat. Sit comfortably with your back supported and both feet on the floor (don't cross your legs). Elevate your arm to heart level on a table or a desk. Use the proper sized cuff. It should fit smoothly and snugly around your bare upper arm. There should be enough room to slip a fingertip under the cuff. The bottom edge of the cuff should be 1 inch above the crease of the elbow. Please monitor your blood pressure call our office IN ONE WEEK at [phone] or send a MyChart message   Important Information About Sugar

## 2022-10-01 ENCOUNTER — Other Ambulatory Visit: Payer: Self-pay | Admitting: Nurse Practitioner

## 2022-10-01 ENCOUNTER — Other Ambulatory Visit: Payer: Self-pay | Admitting: Pharmacist

## 2022-10-01 DIAGNOSIS — I639 Cerebral infarction, unspecified: Secondary | ICD-10-CM

## 2022-10-01 DIAGNOSIS — E785 Hyperlipidemia, unspecified: Secondary | ICD-10-CM

## 2022-10-01 DIAGNOSIS — I2510 Atherosclerotic heart disease of native coronary artery without angina pectoris: Secondary | ICD-10-CM

## 2022-10-01 MED ORDER — PRALUENT 150 MG/ML ~~LOC~~ SOAJ: 150.0000 mg | SUBCUTANEOUS | 3 refills | Status: DC

## 2022-10-01 MED ORDER — REPATHA SURECLICK 140 MG/ML ~~LOC~~ SOAJ: 1.0000 mL | SUBCUTANEOUS | 3 refills | Status: DC

## 2022-10-01 NOTE — Telephone Encounter (Signed)
PA for [place] submitted.  Key: [id number].  PA approved through 10/02/23

## 2022-10-01 NOTE — Telephone Encounter (Signed)
Plan no longer covers Praluent. Plan now prefers Repatha. Completed PA and is approved

## 2022-10-02 ENCOUNTER — Telehealth: Payer: Self-pay

## 2022-10-02 NOTE — Telephone Encounter (Signed)
Patient as approved for repatha from 10/01/22 to 10/02/23. Plan member  # [id number] Pa# [employer] [phone]

## 2022-10-02 NOTE — Telephone Encounter (Signed)
Pt calling in today to discuss lab result and medication changes. Due to pt's insurance Praulent was not a preferred medication.  Pt is to start Repatha (140 mg) q 14 days.  Gerald Stabs, Pharm-D sent in to pt's pharmacy.  Pt  needed a 3 months supply sent in due to same cost for one month as 3 months. Sent a message to [place] to resend.

## 2022-10-07 DIAGNOSIS — S52121D Displaced fracture of head of right radius, subsequent encounter for closed fracture with routine healing: Secondary | ICD-10-CM | POA: Diagnosis not present

## 2022-10-09 ENCOUNTER — Other Ambulatory Visit: Payer: Self-pay

## 2022-10-09 MED ORDER — METOPROLOL TARTRATE 100 MG PO TABS: ORAL_TABLET | ORAL | 0 refills | Status: DC

## 2022-10-09 NOTE — Telephone Encounter (Signed)
Pt's medication was sent to pt's pharmacy as requested. Confirmation received.  °

## 2022-10-15 ENCOUNTER — Ambulatory Visit (HOSPITAL_COMMUNITY): Payer: BC Managed Care – PPO

## 2022-10-21 ENCOUNTER — Ambulatory Visit: Payer: BC Managed Care – PPO

## 2022-10-23 ENCOUNTER — Telehealth (HOSPITAL_COMMUNITY): Payer: Self-pay | Admitting: *Deleted

## 2022-10-23 NOTE — Telephone Encounter (Signed)
Attempted to call patient regarding upcoming cardiac CT appointment. °Left message on voicemail with name and callback number ° °Janard Culp RN Navigator Cardiac Imaging °[facility] °[phone] Office °[phone] Cell ° °

## 2022-10-26 ENCOUNTER — Telehealth (HOSPITAL_COMMUNITY): Payer: Self-pay | Admitting: Emergency Medicine

## 2022-10-26 ENCOUNTER — Telehealth: Payer: Self-pay | Admitting: Cardiology

## 2022-10-26 DIAGNOSIS — R079 Chest pain, unspecified: Secondary | ICD-10-CM

## 2022-10-26 MED ORDER — METOPROLOL TARTRATE 100 MG PO TABS: ORAL_TABLET | ORAL | 0 refills | Status: DC

## 2022-10-26 NOTE — Telephone Encounter (Signed)
Pt arrived to appt today when actually scheduled tomorrow. Took one time dose medication today, sent refill for tomorrows appt. Pt aware and verbalized understanding and appreciation Marchia Bond RN Navigator Cardiac Imaging [facility] [phone] Office  [phone] Cell

## 2022-10-26 NOTE — Telephone Encounter (Signed)
*  STAT* If patient is at the pharmacy, call can be transferred to refill team.   1. Which medications need to be refilled? (please list name of each medication and dose if known)   metoprolol tartrate (LOPRESSOR) 100 MG tablet  2. Which pharmacy/location (including street and city if local pharmacy) is medication to be sent to?    [id number] [place], [place] - [address]   3. Do they need a 30 day or 90 day supply? Single dose   Patient called stating he is scheduled for a CT test tomorrow and a single dose of this medication was prescribed but he accidentally  took this medication today.  Patient states he will need another pill sent to his pharmacy.

## 2022-10-26 NOTE — Telephone Encounter (Signed)
RX has been sent. VM left for pt.

## 2022-10-27 ENCOUNTER — Ambulatory Visit (HOSPITAL_BASED_OUTPATIENT_CLINIC_OR_DEPARTMENT_OTHER)
Admission: RE | Admit: 2022-10-27 | Discharge: 2022-10-27 | Disposition: A | Discharge status: Home / Self Care | Payer: BC Managed Care – PPO | Source: Ambulatory Visit | Attending: Cardiology | Admitting: Cardiology

## 2022-10-27 ENCOUNTER — Other Ambulatory Visit: Payer: Self-pay | Admitting: Cardiology

## 2022-10-27 ENCOUNTER — Ambulatory Visit (HOSPITAL_COMMUNITY)
Admission: RE | Admit: 2022-10-27 | Discharge: 2022-10-27 | Disposition: A | Discharge status: Home / Self Care | Payer: BC Managed Care – PPO | Source: Ambulatory Visit | Attending: Nurse Practitioner | Admitting: Nurse Practitioner

## 2022-10-27 DIAGNOSIS — R931 Abnormal findings on diagnostic imaging of heart and coronary circulation: Secondary | ICD-10-CM | POA: Insufficient documentation

## 2022-10-27 DIAGNOSIS — R079 Chest pain, unspecified: Secondary | ICD-10-CM | POA: Diagnosis not present

## 2022-10-27 DIAGNOSIS — I2510 Atherosclerotic heart disease of native coronary artery without angina pectoris: Secondary | ICD-10-CM | POA: Diagnosis not present

## 2022-10-27 DIAGNOSIS — E785 Hyperlipidemia, unspecified: Secondary | ICD-10-CM | POA: Insufficient documentation

## 2022-10-27 MED ORDER — NITROGLYCERIN 0.4 MG SL SUBL
SUBLINGUAL_TABLET | SUBLINGUAL | Status: AC
  Filled 2022-10-27: qty 2

## 2022-10-27 MED ORDER — NITROGLYCERIN 0.4 MG SL SUBL
0.8000 mg | SUBLINGUAL_TABLET | Freq: Once | SUBLINGUAL | Status: AC
  Administered 2022-10-27: 0.8 mg via SUBLINGUAL

## 2022-10-27 MED ORDER — IOHEXOL 350 MG/ML SOLN
100.0000 mL | Freq: Once | INTRAVENOUS | Status: AC | PRN
  Administered 2022-10-27: 100 mL via INTRAVENOUS

## 2022-10-28 ENCOUNTER — Telehealth: Payer: Self-pay

## 2022-10-28 NOTE — Telephone Encounter (Signed)
LVM for pt to call back to schedule appointment with Coumadin Clinic.

## 2022-10-29 ENCOUNTER — Other Ambulatory Visit: Payer: Self-pay

## 2022-10-29 DIAGNOSIS — Z7901 Long term (current) use of anticoagulants: Secondary | ICD-10-CM

## 2022-10-29 MED ORDER — WARFARIN SODIUM 5 MG PO TABS: ORAL_TABLET | ORAL | 1 refills | Status: DC

## 2022-10-29 NOTE — Telephone Encounter (Signed)
Received VM from patient requesting an appointment for the am on 12/6 and a refill for warfarin. Refill sent.  Scheduled pt for 8:15 am on 12/6.  Called pt to notify him, but was unable to LVM because inbox was full. Sent pt a MyChart message with the above information.

## 2022-11-04 ENCOUNTER — Ambulatory Visit (INDEPENDENT_AMBULATORY_CARE_PROVIDER_SITE_OTHER): Payer: BC Managed Care – PPO

## 2022-11-04 DIAGNOSIS — Z7901 Long term (current) use of anticoagulants: Secondary | ICD-10-CM

## 2022-11-04 LAB — POCT INR: INR: 2.5 (ref 2.0–3.0)

## 2022-11-04 NOTE — Patient Instructions (Addendum)
Pre visit review using our clinic review tool, if applicable. No additional management support is needed unless otherwise documented below in the visit note.  Continue 2 tablets daily except take 3 tablets on Mondays. Recheck in 6 weeks.

## 2022-11-04 NOTE — Progress Notes (Signed)
Continue 2 tablets daily except take 3 tablets on Mondays. Recheck in 6 weeks.

## 2022-11-15 NOTE — Progress Notes (Deleted)
Cardiology Office Note:    Date:  11/15/2022   ID:  Wesley Hancock, DOB [birth year], MRN [id number]  PCP:  Inda Coke, PA   [facility] Providers Cardiologist:  Shirlee More, MD     Referring MD: Inda Coke, [place]   Chief Complaint: coronary artery disease  History of Present Illness:    Wesley Hancock is a very pleasant 51 y.o. male with a hx of PE on chronic anticoagulation, HTN, HLD, coronary artery calcification seen on CT, TIA, severe sleep apnea on CPAP, stroke  History of pulmonary embolism maintained on long-term anticoagulation with antiphospholipid syndrome and protein S deficiency. Initially seen 07/12/2020 for shortness of breath.  Had multifocal pneumonia and persistent infiltrates on x-ray.  CT showed coronary artery calcification.  Report of myocardial perfusion test 07/31/2020 showing EF normal 59% and normal perfusion.  Echo 07/31/2020 showed EF 60 to 65%, mild concentric LVH, normal diastolic function  Last cardiology clinic visit was 10/14/2020 by Dr. Bettina Gavia at which time he was advised to follow-up as needed. CTA chest 05/11/22 revealed coronary artery disease and no evidence of PE.   Seen by me on 09/30/22 for follow-up. Lost his insurance and has not had follow-up in several years. Was told he has coronary artery disease from prior CT at least 2 years ago. Coronary artery disease on CT 2021, reports he was told he has disease in LAD. Has left-sided chest/left shoulder pain that does not worsen with exertion. Pain occurs intermittently and once it comes on lasts for a while. Thinks he might have nausea associated with CP. Feels "panicky" when this occurs and HR speeds up. Has dyspnea but also has history of ILD 2/2 lupus. No orthopnea, PND, edema. Wears compression stockings at work.  Is currently walking 8-10 miles per day at work and recently improved his diet to avoid red meat and other processed foods, eats lots of vegetables 2/2 first bout of  diverticulitis. Compliant with CPAP. Lightheadedness when bending over and standing back up. Hydrates well. Previously on Praluent, became financial burden. Asks about getting 3 month supply.   Today, he is here     Past Medical History:  Diagnosis Date   Acute nonintractable headache 09/06/2019   Acute respiratory failure with hypoxia ([place]) 08/30/2019   AKI (acute kidney injury) ([place]) 08/30/2019   Antiphospholipid syndrome ([place]) 10/30/2019   Anxiety    APS (antiphospholipid syndrome) ([place])    Arthritis    Colon polyp    ? hyperplastic   Depression    Diarrhea 05/21/2020   DVT (deep venous thrombosis) ([name])    Dyspnea on exertion 09/08/2017   "Quit smoking" 2015 with onset of symptoms in 2016  Spirometry 09/08/2017  Flat f/v loop  - d/c acei 09/08/2017  - 01/04/2020   Walked RA x two laps =  approx 573f @ fast pace - stopped due to end of study/ min sob with sats of 93 % at the end of the study. - PFT's  03/08/20   FEV1 3.84 (83 % ) ratio 0..90  p 3 % improvement from saba p ? 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 06/26/20, 03/08/20   Snoring  07/09/2020   Stress fracture    in back, seeing PT   Stroke ([place])    Upper airway cough syndrome 01/05/2020   Onset mid Jan 2021 while on otc PPI  - max rx for gerd 01/04/2020 >>>      Witnessed apneic spells 07/09/2020    Past Surgical History:  Procedure Laterality Date   BRONCHIAL WASHINGS  01/14/2021   Procedure: BRONCHIAL WASHINGS;  Surgeon: Freddi Starr, MD;  Location: [facility];  Service: Pulmonary;;   CHOLECYSTECTOMY     VIDEO BRONCHOSCOPY N/A 01/14/2021   Procedure: VIDEO BRONCHOSCOPY WITHOUT FLUORO;  Surgeon: Freddi Starr, MD;  Location: [facility];  Service: Pulmonary;  Laterality: N/A;    Current Medications: No outpatient medications have been marked as taking for the 11/19/22 encounter (Appointment) with Ann Maki, Lanice Schwab, NP.     Allergies:   Diltiazem hcl   Social History   Socioeconomic History   Marital status: Married    Spouse name: Lauren   Number of children: 3   Years of education: Not on file   Highest education level: Not on file  Occupational History   Occupation: Truck driver  Tobacco Use   Smoking status: Former    Packs/day: 1.00    Years: 20.00    Total pack years: 20.00    Types: Cigarettes    Quit date: 11/30/2013    Years since quitting: 8.9   Smokeless tobacco: Former    Types: Snuff    Quit date: 05/21/2018  Vaping Use   Vaping Use: Never used  Substance and Sexual Activity   Alcohol use: Not Currently   Drug use: No   Sexual activity: Yes  Other Topics Concern   Not on file  Social History Narrative   Lives with wife and daughter at home   Right Handed   Drinks about 2 cups caffeine daily   Social Determinants of Health   Financial Resource Strain: Not on file  Food Insecurity: Not on file  Transportation Needs: Not on file  Physical Activity: Not on file  Stress: Not on file  Social Connections: Not on file     Family History: The patient's family history includes Asthma in his mother; Colon cancer  in his maternal grandmother; Colon polyps in his father; Diabetes in his maternal grandmother, mother, and sister; Diverticulitis in his father; Healthy in his daughter, daughter, and son; Heart disease in his maternal grandfather, maternal grandmother, and paternal grandmother; Heart disease (age of onset: 72) in his father; Hyperlipidemia in his father, maternal grandfather, maternal grandmother, mother, and sister; Hypertension in his father, maternal grandfather, maternal grandmother, mother, and sister; Irritable bowel syndrome in his father and maternal grandfather; Liver cancer in his maternal grandfather; Lung cancer (age of onset: 34) in his father; Prostate cancer (age of onset: 71) in his father; Stroke in his maternal grandfather; Thyroid disease in his mother.  ROS:   Please  see the history of present illness.    *** All other systems reviewed and are negative.  Labs/Other Studies Reviewed:    The following studies were reviewed today:   CCTA 10/27/22 1. Coronary calcium score of 618. This was 99th percentile for age and sex matched control.   2. Normal coronary origin with left dominance.   3. Calcified plaque causes moderate (50-69%) stenosis in D1   4. Calcified plaque causes mild (25-49%) stenosis in mid LAD and proximal LCX   5. Calcified plaque causes minimal (0-24%) stenosis in proximal LAD, distal LAD, and OM1   6. Will send study for CTFFR  IMPRESSION: 1.  CTFFR suggests nonobstructive CAD  Echo Bubble Study 03/22/21  1. Left ventricular ejection fraction, by estimation, is 55 to 60%. The  left ventricle has normal function. The left ventricle has no regional  wall motion abnormalities. There is mild left ventricular hypertrophy.  Left ventricular diastolic parameters  are indeterminate.   2. Right ventricular systolic function is normal. The right ventricular  size is normal. Tricuspid regurgitation signal is inadequate for assessing  PA pressure.   3.  The mitral valve is normal in structure. No evidence of mitral valve  regurgitation.   4. The aortic valve is tricuspid. Aortic valve regurgitation is not  visualized. No aortic stenosis is present.   5. The inferior vena cava is normal in size with greater than 50%  respiratory variability, suggesting right atrial pressure of 3 mmHg.   6. Agitated saline contrast bubble study was negative, with no evidence  of any interatrial shunt.   Lexiscan Myoview 08/01/20  The left ventricular ejection fraction is normal (55-65%). Nuclear stress EF: 59%. There were no wall motion abnormalities There was no ST segment deviation noted during stress. The study is normal. This is a low risk study. No perfusion defects.  Recent Labs: 09/06/2022: Hemoglobin 15.6; Platelets 272 09/30/2022: ALT 46; BUN 13; Creatinine, Ser 0.97; Potassium 4.1; Sodium 139  Recent Lipid Panel    Component Value Date/Time   CHOL 212 (H) 09/30/2022 0836   TRIG 158 (H) 09/30/2022 0836   HDL 36 (L) 09/30/2022 0836   CHOLHDL 5.9 (H) 09/30/2022 0836   CHOLHDL 6.4 03/21/2021 0550   VLDL 40 03/21/2021 0550   LDLCALC 147 (H) 09/30/2022 0836     Risk Assessment/Calculations:       Physical Exam:    VS:  There were no vitals taken for this visit.    Wt Readings from Last 3 Encounters:  09/30/22 (!) 310 lb (140.6 kg)  09/14/22 (!) 310 lb (140.6 kg)  09/06/22 300 lb (136.1 kg)     GEN: Well nourished, well developed in no acute distress HEENT: Normal NECK: No JVD; No carotid bruits CARDIAC: RRR, no murmurs, rubs, gallops RESPIRATORY:  Clear to auscultation without rales, wheezing or rhonchi  ABDOMEN: Soft, non-tender, non-distended MUSCULOSKELETAL:  No edema; No deformity. 2+ pedal pulses, equal bilaterally SKIN: Warm and dry NEUROLOGIC:  Alert and oriented x 3 PSYCHIATRIC:  Normal affect   EKG:  EKG is not ordered today.   No BP recorded.  {Refresh Note OR Click here to enter BP  :1}***  Diagnoses:    No  diagnosis found.  Assessment and Plan:     Chest pain: Having left-sided chest pain associated with nausea. Has DOE that he feels has been stable, also has hx of ILD. Pain somewhat atypical for angina as it does not intensify with exertion, however with prior history of  coronary artery calcification would like to get coronary CTA to evaluate for coronary artery stenosis.  We will have him take Lopressor 100 mg prior to CT.  Coronary artery calcification seen on CT: Prior documentation of coronary artery calcification on CT. No prior documentation of coronary calcium score.  As noted above, we are getting coronary CTA for mapping of coronary anatomy in the setting of chest pain. Will treat hyperlipidemia for LDL goal < 70.  Encouraged continued heart healthy, mostly plant based diet and 150 minutes moderate intensity exercise each week.   History of stroke/Hypercoagulable state: Presented 03/20/2021 with sudden onset speech difficulty. MRI showed acute infarct in left frontal lobe and insula region.  CTA of head and neck showed irregularity of superior division MCA with distal occlusion, CT perfusion showed no significant penumbra. No cardiac sourc of emboli on TEE. On Coumadin, management by PCP. No bleeding concerns. Management per neurology/PCP.   Hyperlipidemia LDL goal < 70: LDL 147 on 09/30/22. Started on [place] which was preferred by insurance.  Hypertension: BP is elevated today and remains elevated on my recheck.  He did not take medications prior to arrival today, ran out a few days ago. I have asked him to monitor at home and report back to [place] in 1 week so that medication adjustments can be made if BP remains elevated.  We will refill his medications today.     Disposition: 2 months with me  Medication Adjustments/Labs and Tests Ordered: Current medicines are reviewed at length with the patient today.  Concerns regarding medicines are outlined above.  No orders of the defined types were  placed in this encounter.  No orders of the defined types were placed in this encounter.   There are no Patient Instructions on file for this visit.   Signed, Emmaline Life, NP  11/15/2022 4:02 PM    [place]

## 2022-11-17 ENCOUNTER — Other Ambulatory Visit: Payer: Self-pay

## 2022-11-19 ENCOUNTER — Ambulatory Visit: Payer: BC Managed Care – PPO | Attending: Nurse Practitioner | Admitting: Nurse Practitioner

## 2022-12-10 ENCOUNTER — Encounter: Payer: Self-pay | Admitting: Family

## 2022-12-10 ENCOUNTER — Ambulatory Visit: Payer: BC Managed Care – PPO | Admitting: Family

## 2022-12-10 VITALS — BP 106/74 | HR 92 | Temp 98.2°F | Ht 73.5 in | Wt 305.4 lb

## 2022-12-10 DIAGNOSIS — K529 Noninfective gastroenteritis and colitis, unspecified: Secondary | ICD-10-CM | POA: Diagnosis not present

## 2022-12-10 MED ORDER — DIPHENOXYLATE-ATROPINE 2.5-0.025 MG PO TABS: 1.0000 | ORAL_TABLET | Freq: Four times a day (QID) | ORAL | 0 refills | Status: DC | PRN

## 2022-12-10 NOTE — Progress Notes (Signed)
Patient ID: Wesley Hancock, male    DOB: 27-Jul-1971, 52 y.o.   MRN: [id number]  Chief Complaint  Patient presents with   Diarrhea    Pt c/o diarrhea and vomiting, Present since Tuesday. Has not tried any medications.    HPI:  GI sx:  pt reports nausea, vomiting states occurred 3x yesterday and diarrhea about 10x yesterday for 2 days.  pt reports he has been hydrating well and appetite is ok. Denies fever or any abdominal cramping. Denies any new meds or dose changes recently.      Assessment & Plan:  1. Gastroenteritis - sending Lomotil, advised on use & SE. Importance of hydrating, BRAT diet.  - diphenoxylate-atropine (LOMOTIL) 2.5-0.025 MG tablet; Take 1 tablet by mouth 4 (four) times daily as needed for diarrhea or loose stools.  Dispense: 30 tablet; Refill: 0    Subjective:    Outpatient Medications Prior to Visit  Medication Sig Dispense Refill   acetaminophen (TYLENOL) 500 MG tablet Take 1,000 mg by mouth every 6 (six) hours as needed for mild pain.     albuterol (VENTOLIN HFA) 108 (90 Base) MCG/ACT inhaler Inhale 2 puffs into the lungs every 6 (six) hours as needed for wheezing or shortness of breath. 8 g 6   atorvastatin (LIPITOR) 40 MG tablet Take 1 tablet (40 mg total) by mouth daily. 30 tablet 1   buPROPion (WELLBUTRIN) 75 MG tablet Take 1 tablet (75 mg total) by mouth 2 (two) times daily. 270 tablet 0   escitalopram (LEXAPRO) 20 MG tablet Take 1 tablet (20 mg total) by mouth daily. 90 tablet 1   Evolocumab (REPATHA SURECLICK) 413 MG/ML SOAJ Inject 140 mg into the skin every 14 (fourteen) days. 6 mL 3   fluticasone (FLONASE) 50 MCG/ACT nasal spray Place 1 spray into both nostrils daily as needed for allergies.     hydrochlorothiazide (HYDRODIURIL) 25 MG tablet Take 1 tablet (25 mg total) by mouth daily. 90 tablet 3   Multiple Vitamin (MULTIVITAMIN) tablet Take by mouth daily. 2 chews daily     mycophenolate (CELLCEPT) 500 MG tablet Take 2 tablets (1,000 mg total) by  mouth 2 (two) times daily. 360 tablet 1   pantoprazole (PROTONIX) 40 MG tablet Take 1 tablet (40 mg total) by mouth daily. 90 tablet 3   telmisartan (MICARDIS) 80 MG tablet Take 1 tablet (80 mg total) by mouth daily. 90 tablet 3   triamcinolone cream (KENALOG) 0.1 % Apply to affected area 1-2 times daily 30 g 0   warfarin (COUMADIN) 5 MG tablet TAKE 2 TABLETS DAILY BY MOUTH EXCEPT TAKE 3 TABLETS ON MONDAYS OR AS DIRECTED BY ANTICOAGULATION CLINIC 200 tablet 1   metoprolol tartrate (LOPRESSOR) 100 MG tablet Take one (1) tablet by mouth ( 100 mg ) two hours prior to CT scan. (Patient not taking: Reported on 12/10/2022) 1 tablet 0   No facility-administered medications prior to visit.   Past Medical History:  Diagnosis Date   Acute nonintractable headache 09/06/2019   Acute respiratory failure with hypoxia ([place]) 08/30/2019   AKI (acute kidney injury) ([place]) 08/30/2019   Antiphospholipid syndrome ([place]) 10/30/2019   Anxiety    APS (antiphospholipid syndrome) ([place])    Arthritis    Colon polyp    ? hyperplastic   Depression    Diarrhea 05/21/2020   DVT (deep venous thrombosis) ([name])    Dyspnea on exertion 09/08/2017   "Quit smoking" 2015 with onset of symptoms in 2016  Spirometry 09/08/2017  Flat f/v  loop  - d/c acei 09/08/2017  - 01/04/2020   Walked RA x two laps =  approx 510f @ fast pace - stopped due to end of study/ min sob with sats of 93 % at the end of the study. - PFT's  03/08/20   FEV1 3.84 (83 % ) ratio 0..90  p 3 % improvement from saba p ? 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 06/26/20, 03/08/20   Snoring 07/09/2020   Stress fracture    in back, seeing PT   Stroke ([place]    Upper airway cough syndrome 01/05/2020   Onset mid Jan 2021 while on otc PPI  - max rx for gerd 01/04/2020 >>>      Witnessed apneic spells 07/09/2020   Past Surgical History:  Procedure Laterality Date   BRONCHIAL WASHINGS  01/14/2021   Procedure: BRONCHIAL WASHINGS;  Surgeon: DFreddi Starr MD;  [facility];  Service: Pulmonary;;   CHOLECYSTECTOMY     VIDEO BRONCHOSCOPY N/A 01/14/2021   Procedure: VIDEO BRONCHOSCOPY WITHOUT FLUORO;  Surgeon: DFreddi Starr MD;  [facility];  Service: Pulmonary;  Laterality: N/A;   Allergies  Allergen Reactions   Diltiazem Hcl Diarrhea and Other (See Comments)    Lethargic       Objective:    Physical Exam Vitals and nursing note reviewed.  Constitutional:      General: He is not in acute distress.    Appearance: Normal appearance. He is obese.  HENT:     Head: Normocephalic.  Cardiovascular:     Rate and Rhythm: Normal rate and regular rhythm.  Pulmonary:     Effort: Pulmonary effort is normal.     Breath sounds: Normal breath sounds.  Musculoskeletal:        General: Normal range of motion.     Cervical back: Normal range of motion.  Skin:    General: Skin is warm and dry.  Neurological:     Mental Status: He is alert and oriented to person, place, and time.  Psychiatric:        Mood and Affect: Mood normal.    BP 106/74 (BP Location: Left Arm, Patient Position: Sitting,  Cuff Size: Large)   Pulse 92   Temp 98.2 F (36.8 C) (Temporal)   Ht 6' 1.5" (1.867 m)   Wt (!) 305 lb 6 oz (138.5 kg)   SpO2 96%   BMI 39.74 kg/m  Wt Readings from Last 3 Encounters:  12/10/22 (!) 305 lb 6 oz (138.5 kg)  09/30/22 (!) 310 lb (140.6 kg)  09/14/22 (!) 310  lb (140.6 kg)       Jeanie Sewer, NP

## 2022-12-16 ENCOUNTER — Ambulatory Visit: Payer: BC Managed Care – PPO

## 2022-12-23 ENCOUNTER — Ambulatory Visit (INDEPENDENT_AMBULATORY_CARE_PROVIDER_SITE_OTHER): Payer: BC Managed Care – PPO

## 2022-12-23 DIAGNOSIS — Z7901 Long term (current) use of anticoagulants: Secondary | ICD-10-CM | POA: Diagnosis not present

## 2022-12-23 LAB — POCT INR: INR: 2.7 (ref 2.0–3.0)

## 2022-12-23 NOTE — Progress Notes (Signed)
Continue 2 tablets daily except take 3 tablets on Mondays. Recheck in 6 weeks.

## 2022-12-23 NOTE — Patient Instructions (Addendum)
Pre visit review using our clinic review tool, if applicable. No additional management support is needed unless otherwise documented below in the visit note.  Continue 2 tablets daily except take 3 tablets on Mondays. Recheck in 6 weeks.

## 2023-01-19 ENCOUNTER — Encounter: Payer: Self-pay | Admitting: Physician Assistant

## 2023-01-19 ENCOUNTER — Ambulatory Visit: Payer: BC Managed Care – PPO | Admitting: Physician Assistant

## 2023-01-19 VITALS — BP 130/80 | HR 82 | Temp 97.5°F | Ht 73.5 in | Wt 310.0 lb

## 2023-01-19 DIAGNOSIS — R052 Subacute cough: Secondary | ICD-10-CM

## 2023-01-19 MED ORDER — PREDNISONE 20 MG PO TABS: 40.0000 mg | ORAL_TABLET | Freq: Every day | ORAL | 0 refills | Status: DC

## 2023-01-19 MED ORDER — AMOXICILLIN-POT CLAVULANATE 875-125 MG PO TABS: 1.0000 | ORAL_TABLET | Freq: Two times a day (BID) | ORAL | 0 refills | Status: DC

## 2023-01-19 NOTE — Progress Notes (Signed)
Wesley Hancock is a 52 y.o. male here for a new problem.  History of Present Illness:   Chief Complaint  Patient presents with   Cough    Pt c/o cough, congestion and sinus pressure x 2 weeks, went to Urgent care was given antibiotic for 10 days as soon as he stopped cough has worsened. Coughing and expectorating greenish yellow/white.Unable to hear out of left ear started yesterday.     Cough    Cough Patient c/o cough, congestion and sinus pressure x 2 weeks, went to Urgent Care 01/09/22 was given Augmentin for 10 days as soon as he stopped cough has worsened.  Coughing and expectorating greenish yellow/white. Unable to hear out of left ear started yesterday.  Not taking anything over the counter consistently -- mucinex with cough suppressant  Denies chest pain, shortness of breath, chest, lower extremity swelling  Past Medical History:  Diagnosis Date   Acute nonintractable headache 09/06/2019   Acute respiratory failure with hypoxia ([place]) 08/30/2019   AKI (acute kidney injury) ([place]) 08/30/2019   Antiphospholipid syndrome ([place]) 10/30/2019   Anxiety    APS (antiphospholipid syndrome) ([place])    Arthritis    Colon polyp    ? hyperplastic   Depression    Diarrhea 05/21/2020   DVT (deep venous thrombosis) ([place])    Dyspnea on exertion 09/08/2017   "Quit smoking" 2015 with onset of symptoms in 2016  Spirometry 09/08/2017  Flat f/v loop  - d/c acei 09/08/2017  - 01/04/2020   Walked RA x two laps =  approx 530f @ fast pace - stopped due to end of study/ min sob with sats of 93 % at the end of the study. - PFT's  03/08/20   FEV1 3.84 (83 % ) ratio 0..90  p 3 % improvement from saba p ? 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 06/26/20, 03/08/20   Snoring 07/09/2020   Stress fracture    in back, seeing PT   Stroke ([facility]    Upper airway cough syndrome 01/05/2020   Onset mid Jan 2021 while on otc PPI  - max rx for gerd 01/04/2020 >>>      Witnessed apneic spells 07/09/2020     Social History   Tobacco Use   Smoking status: Former    Packs/day: 1.00    Years: 20.00    Total pack years: 20.00    Types: Cigarettes    Quit date: 11/30/2013    Years since quitting: 9.1   Smokeless tobacco: Former    Types: Snuff    Quit date: 05/21/2018  Vaping  Use   Vaping Use: Never used  Substance Use Topics   Alcohol use: Not Currently   Drug use: No    Past Surgical History:  Procedure Laterality Date   BRONCHIAL WASHINGS  01/14/2021   Procedure: BRONCHIAL WASHINGS;  Surgeon: Freddi Starr, MD;  Location: [facility];  Service: Pulmonary;;   CHOLECYSTECTOMY     VIDEO BRONCHOSCOPY N/A 01/14/2021   Procedure: VIDEO BRONCHOSCOPY WITHOUT FLUORO;  Surgeon: Freddi Starr, MD;  Location: [facility];  Service: Pulmonary;  Laterality: N/A;    Family History  Problem Relation Age of Onset   Diabetes Mother    Hyperlipidemia Mother    Hypertension Mother    Thyroid disease Mother        uncertain type--had surg--no cancer   Asthma Mother        died from PNA   Heart disease Father 26   Hyperlipidemia Father     Hypertension Father    Prostate cancer Father 67   Lung cancer Father 30       Dx 06/18/2017   Colon polyps Father    Irritable bowel syndrome Father    Diverticulitis Father    Diabetes Sister    Hyperlipidemia Sister    Hypertension Sister    Diabetes Maternal Grandmother    Heart disease Maternal Grandmother    Hyperlipidemia Maternal Grandmother    Hypertension Maternal Grandmother    Colon cancer Maternal Grandmother    Heart disease Maternal Grandfather    Hyperlipidemia Maternal Grandfather    Hypertension Maternal Grandfather    Stroke Maternal Grandfather    Liver cancer Maternal Grandfather    Irritable bowel syndrome Maternal Grandfather    Heart disease Paternal Grandmother    Healthy Daughter    Healthy Daughter    Healthy Son     Allergies  Allergen Reactions   Diltiazem Hcl Diarrhea and Other (See Comments)    Lethargic     Current Medications:   Current Outpatient Medications:    acetaminophen (TYLENOL) 500 MG tablet, Take 1,000 mg by mouth every 6 (six) hours as needed for mild pain., Disp: , Rfl:    albuterol (VENTOLIN HFA) 108 (90 Base) MCG/ACT inhaler, Inhale 2 puffs into the lungs every 6 (six) hours as needed for wheezing or shortness of breath., Disp: 8 g, Rfl: 6   atorvastatin (LIPITOR) 40 MG tablet, Take 1 tablet (40 mg total) by mouth daily., Disp: 30 tablet, Rfl: 1   buPROPion (WELLBUTRIN) 75 MG tablet, Take 1 tablet (75 mg total) by mouth 2 (two) times daily., Disp: 270 tablet, Rfl: 0   escitalopram (LEXAPRO) 20 MG tablet, Take 1 tablet (20 mg total) by mouth daily., Disp: 90 tablet, Rfl: 1   Evolocumab (REPATHA SURECLICK) XX123456 MG/ML SOAJ, Inject 140 mg into the skin every 14 (fourteen) days., Disp: 6 mL, Rfl: 3   fluticasone (FLONASE) 50 MCG/ACT nasal spray, Place 1 spray into both nostrils daily as needed for allergies., Disp: , Rfl:    hydrochlorothiazide (HYDRODIURIL) 25 MG tablet, Take 1 tablet (25 mg total) by mouth daily., Disp: 90 tablet,  Rfl: 3   Multiple Vitamin (MULTIVITAMIN) tablet, Take by mouth daily. 2 chews daily, Disp: , Rfl:    mycophenolate (CELLCEPT) 500 MG tablet, Take 2 tablets (1,000 mg total) by mouth 2 (two) times daily., Disp: 360 tablet, Rfl: 1   pantoprazole (PROTONIX) 40 MG tablet, Take 1 tablet (40 mg total) by mouth daily., Disp: 90 tablet, Rfl: 3   telmisartan (MICARDIS)  80 MG tablet, Take 1 tablet (80 mg total) by mouth daily., Disp: 90 tablet, Rfl: 3   triamcinolone cream (KENALOG) 0.1 %, Apply to affected area 1-2 times daily, Disp: 30 g, Rfl: 0   warfarin (COUMADIN) 5 MG tablet, TAKE 2 TABLETS DAILY BY MOUTH EXCEPT TAKE 3 TABLETS ON MONDAYS OR AS DIRECTED BY ANTICOAGULATION CLINIC, Disp: 200 tablet, Rfl: 1   Review of Systems:   Review of Systems  Respiratory:  Positive for cough.    Negative unless otherwise specified per HPI.   Vitals:   Vitals:   01/19/23 1021  BP: 130/80  Pulse: 82  Temp: (!) 97.5 F (36.4 C)  TempSrc: Temporal  SpO2: 97%  Weight: (!) 310 lb (140.6 kg)  Height: 6' 1.5" (1.867 m)     Body mass index is 40.35 kg/m.  Physical Exam:   Physical Exam Vitals and nursing note reviewed.  Constitutional:      General: He is not in acute distress.    Appearance: He is well-developed. He is not ill-appearing or toxic-appearing.  HENT:     Head: Normocephalic and atraumatic.     Right Ear: Tympanic membrane, ear canal and external ear normal. Tympanic membrane is not erythematous, retracted or bulging.     Left Ear: Ear canal and external ear normal. A middle ear effusion is present. Tympanic membrane is erythematous. Tympanic membrane is not retracted or bulging.     Nose: Nose normal.     Right Sinus: No maxillary sinus tenderness or frontal sinus tenderness.     Left Sinus: No maxillary sinus tenderness or frontal sinus tenderness.     Mouth/Throat:     Pharynx: Uvula midline. No posterior oropharyngeal erythema.  Eyes:     General: Lids are normal.      Conjunctiva/sclera: Conjunctivae normal.  Neck:     Trachea: Trachea normal.  Cardiovascular:     Rate and Rhythm: Normal rate and regular rhythm.     Heart sounds: Normal heart sounds, S1 normal and S2 normal.  Pulmonary:     Effort: Pulmonary effort is normal.     Breath sounds: Normal breath sounds. No decreased breath sounds, wheezing, rhonchi or rales.  Lymphadenopathy:     Cervical: No cervical adenopathy.  Skin:    General: Skin is warm and dry.  Neurological:     Mental Status: He is alert.  Psychiatric:        Speech: Speech normal.        Behavior: Behavior normal. Behavior is cooperative.     Assessment and Plan:   Subacute cough No red flags on exam.  Will initiate augmentin and prednisone per orders for L AOM. Discussed taking medications as prescribed. Reviewed return precautions including worsening fever, SOB, worsening cough or other concerns. Push fluids and rest. I recommend that patient follow-up if symptoms worsen or persist despite treatment x 7-10 days, sooner if needed.     Inda Coke, PA-C

## 2023-01-20 DIAGNOSIS — J168 Pneumonia due to other specified infectious organisms: Secondary | ICD-10-CM | POA: Diagnosis not present

## 2023-01-20 DIAGNOSIS — M546 Pain in thoracic spine: Secondary | ICD-10-CM | POA: Diagnosis not present

## 2023-01-20 DIAGNOSIS — R059 Cough, unspecified: Secondary | ICD-10-CM | POA: Diagnosis not present

## 2023-01-20 DIAGNOSIS — J189 Pneumonia, unspecified organism: Secondary | ICD-10-CM | POA: Diagnosis not present

## 2023-01-20 DIAGNOSIS — Z888 Allergy status to other drugs, medicaments and biological substances status: Secondary | ICD-10-CM | POA: Diagnosis not present

## 2023-01-20 DIAGNOSIS — I10 Essential (primary) hypertension: Secondary | ICD-10-CM | POA: Diagnosis not present

## 2023-01-20 DIAGNOSIS — R918 Other nonspecific abnormal finding of lung field: Secondary | ICD-10-CM | POA: Diagnosis not present

## 2023-01-20 DIAGNOSIS — I2510 Atherosclerotic heart disease of native coronary artery without angina pectoris: Secondary | ICD-10-CM | POA: Diagnosis not present

## 2023-01-20 DIAGNOSIS — Z87891 Personal history of nicotine dependence: Secondary | ICD-10-CM | POA: Diagnosis not present

## 2023-01-20 DIAGNOSIS — Z79899 Other long term (current) drug therapy: Secondary | ICD-10-CM | POA: Diagnosis not present

## 2023-01-20 DIAGNOSIS — R0789 Other chest pain: Secondary | ICD-10-CM | POA: Diagnosis not present

## 2023-01-20 DIAGNOSIS — Z1152 Encounter for screening for COVID-19: Secondary | ICD-10-CM | POA: Diagnosis not present

## 2023-01-20 DIAGNOSIS — Z7901 Long term (current) use of anticoagulants: Secondary | ICD-10-CM | POA: Diagnosis not present

## 2023-02-03 ENCOUNTER — Ambulatory Visit: Payer: BC Managed Care – PPO

## 2023-02-03 ENCOUNTER — Other Ambulatory Visit: Payer: Self-pay | Admitting: Physician Assistant

## 2023-02-03 DIAGNOSIS — J849 Interstitial pulmonary disease, unspecified: Secondary | ICD-10-CM

## 2023-02-10 ENCOUNTER — Ambulatory Visit (INDEPENDENT_AMBULATORY_CARE_PROVIDER_SITE_OTHER): Payer: BC Managed Care – PPO

## 2023-02-10 DIAGNOSIS — Z7901 Long term (current) use of anticoagulants: Secondary | ICD-10-CM | POA: Diagnosis not present

## 2023-02-10 LAB — POCT INR: INR: 2.8 (ref 2.0–3.0)

## 2023-02-10 NOTE — Patient Instructions (Addendum)
Pre visit review using our clinic review tool, if applicable. No additional management support is needed unless otherwise documented below in the visit note.  Continue 2 tablets daily except take 3 tablets on Mondays. Recheck in 6 weeks.  

## 2023-02-10 NOTE — Progress Notes (Signed)
Continue 2 tablets daily except take 3 tablets on Mondays. Recheck in 6 weeks.  

## 2023-02-18 ENCOUNTER — Other Ambulatory Visit: Payer: Self-pay | Admitting: Physician Assistant

## 2023-02-23 ENCOUNTER — Other Ambulatory Visit: Payer: Self-pay | Admitting: Physician Assistant

## 2023-03-23 ENCOUNTER — Encounter: Payer: Self-pay | Admitting: Physician Assistant

## 2023-03-23 ENCOUNTER — Ambulatory Visit: Payer: BC Managed Care – PPO | Admitting: Family Medicine

## 2023-03-23 ENCOUNTER — Ambulatory Visit: Payer: BC Managed Care – PPO | Admitting: Physician Assistant

## 2023-03-23 VITALS — BP 142/98 | HR 74 | Temp 98.0°F | Ht 73.5 in | Wt 317.2 lb

## 2023-03-23 DIAGNOSIS — I119 Hypertensive heart disease without heart failure: Secondary | ICD-10-CM | POA: Diagnosis not present

## 2023-03-23 DIAGNOSIS — R200 Anesthesia of skin: Secondary | ICD-10-CM

## 2023-03-23 NOTE — Progress Notes (Signed)
Wesley Hancock is a 52 y.o. male here for a follow up of a pre-existing problem.  History of Present Illness:   Chief Complaint  Patient presents with   Numbness    Pt c/o left arm numbness underside of bicep x 3 weeks.    HPI  Left Arm numbness: He complains of left arm numbness near his tricep for about 3 weeks.  He reports having noticed numbness while scratching his arm while driving with his left arm.  States improvements on some days, but with no consistency.  Denies any coldness, redness, or numbness on left lower extremities or on right side.  He has no history of neck injuries.  He reports a history of compression fractures in back.  He's previously followed up with Dr. Denyse Amass and has had PT.  Denies vision changes, chest pain, SOB.  Hypertension: His blood pressure is elevated during this visit at 150/90.  He attributes this to being unable to take his Telmisartan 80 mg and HCTZ 25 mg today Denies: chest pain, SOB, LE swelling  Past Medical History:  Diagnosis Date   Acute nonintractable headache 09/06/2019   Acute respiratory failure with hypoxia 08/30/2019   AKI (acute kidney injury) 08/30/2019   Antiphospholipid syndrome 10/30/2019   Anxiety    APS (antiphospholipid syndrome)    Arthritis    Colon polyp    ? hyperplastic   Depression    Diarrhea 05/21/2020   DVT (deep venous thrombosis)    Dyspnea on exertion 09/08/2017   "Quit smoking" 2015 with onset of symptoms in 2016  Spirometry 09/08/2017  Flat f/v loop  - d/c acei 09/08/2017  - 01/04/2020   Walked RA x two laps =  approx 557ft @ fast pace - stopped due to end of study/ min sob with sats of 93 % at the end of the study. - PFT's  03/08/20   FEV1 3.84 (83 % ) ratio 0..90  p 3 % improvement from saba p ? 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 06/26/20, 03/08/20   Snoring 07/09/2020   Stress fracture    in back, seeing PT   Stroke    Upper airway cough syndrome 01/05/2020   Onset mid Jan 2021 while on otc PPI  - max rx for gerd 01/04/2020 >>>      Witnessed apneic spells 07/09/2020     Social History   Tobacco Use   Smoking status: Former    Packs/day: 1.00    Years: 20.00    Additional pack years: 0.00    Total pack years: 20.00    Types: Cigarettes  Quit date: 11/30/2013    Years since quitting: 9.3   Smokeless tobacco: Former    Types: Snuff    Quit date: 05/21/2018  Vaping Use   Vaping Use: Never used  Substance Use Topics   Alcohol use: Not Currently   Drug use: No    Past Surgical History:  Procedure Laterality Date   BRONCHIAL WASHINGS  01/14/2021   Procedure: BRONCHIAL WASHINGS;  Surgeon: Martina Sinner, MD;  Location: [facility];  Service: Pulmonary;;   CHOLECYSTECTOMY     VIDEO BRONCHOSCOPY N/A 01/14/2021   Procedure: VIDEO BRONCHOSCOPY WITHOUT FLUORO;  Surgeon: Martina Sinner, MD;  Location: [facility];  Service: Pulmonary;  Laterality: N/A;    Family History  Problem Relation Age of Onset   Diabetes Mother    Hyperlipidemia Mother    Hypertension Mother    Thyroid disease Mother        uncertain type--had surg--no cancer    Asthma Mother        died from PNA   Heart disease Father 49   Hyperlipidemia Father    Hypertension Father    Prostate cancer Father 82   Lung cancer Father 25       Dx 06/18/2017   Colon polyps Father    Irritable bowel syndrome Father    Diverticulitis Father    Diabetes Sister    Hyperlipidemia Sister    Hypertension Sister    Diabetes Maternal Grandmother    Heart disease Maternal Grandmother    Hyperlipidemia Maternal Grandmother    Hypertension Maternal Grandmother    Colon cancer Maternal Grandmother    Heart disease Maternal Grandfather    Hyperlipidemia Maternal Grandfather    Hypertension Maternal Grandfather    Stroke Maternal Grandfather    Liver cancer Maternal Grandfather    Irritable bowel syndrome Maternal Grandfather    Heart disease Paternal Grandmother    Healthy Daughter    Healthy Daughter    Healthy Son     Allergies  Allergen Reactions   Diltiazem Hcl Diarrhea and Other (See Comments)    Lethargic     Current Medications:   Current Outpatient Medications:    acetaminophen (TYLENOL) 500 MG tablet, Take 1,000 mg by mouth every 6 (six) hours as needed for mild pain., Disp: , Rfl:    albuterol (VENTOLIN HFA) 108 (90 Base) MCG/ACT inhaler, Inhale 2 puffs into the lungs every 6 (six) hours as needed for wheezing or shortness of breath., Disp: 8 g, Rfl: 6   atorvastatin (LIPITOR) 40 MG tablet, Take 1 tablet (40 mg total) by mouth daily., Disp: 30 tablet, Rfl: 1   buPROPion (WELLBUTRIN) 75 MG tablet, Take 1 tablet (75 mg total) by mouth 2 (two) times daily., Disp: 270 tablet, Rfl: 0   escitalopram (LEXAPRO) 20 MG tablet, TAKE 1 TABLET BY MOUTH EVERY DAY, Disp: 90 tablet, Rfl: 1   Evolocumab (REPATHA SURECLICK) 140 MG/ML SOAJ, Inject 140 mg into the skin every 14 (fourteen) days., Disp: 6 mL, Rfl: 3   fluticasone (FLONASE) 50 MCG/ACT nasal spray, Place 1 spray into both nostrils daily as needed for allergies., Disp: , Rfl:    hydrochlorothiazide  (HYDRODIURIL) 25 MG tablet, Take 1 tablet (25 mg total) by mouth daily., Disp: 90 tablet, Rfl: 3   Multiple Vitamin (MULTIVITAMIN) tablet, Take by mouth daily. 2 chews daily, Disp: , Rfl:    mycophenolate (CELLCEPT) 500 MG tablet, TAKE 2 TABLETS BY MOUTH TWICE DAILY, Disp: 360 tablet, Rfl: 1   pantoprazole (PROTONIX)  40 MG tablet, Take 1 tablet (40 mg total) by mouth daily., Disp: 90 tablet, Rfl: 3   telmisartan (MICARDIS) 80 MG tablet, Take 1 tablet (80 mg total) by mouth daily., Disp: 90 tablet, Rfl: 3   triamcinolone cream (KENALOG) 0.1 %, Apply to affected area 1-2 times daily, Disp: 30 g, Rfl: 0   warfarin (COUMADIN) 5 MG tablet, TAKE 2 TABLETS DAILY BY MOUTH EXCEPT TAKE 3 TABLETS ON MONDAYS OR AS DIRECTED BY ANTICOAGULATION CLINIC, Disp: 200 tablet, Rfl: 1   Review of Systems:   Review of Systems  Neurological:  Positive for sensory change.       (+) Numbness - left arm (see HPI)    Vitals:   Vitals:   03/23/23 1037 03/23/23 1106  BP: (!) 150/90 (!) 142/98  Pulse: 74   Temp: 98 F (36.7 C)   TempSrc: Temporal   SpO2: 98%   Weight: (!) 317 lb 4 oz (143.9 kg)   Height: 6' 1.5" (1.867 m)      Body mass index is 41.29 kg/m.  Physical Exam:   Physical Exam Vitals and nursing note reviewed.  Constitutional:      General: He is not in acute distress.    Appearance: He is well-developed. He is not ill-appearing or toxic-appearing.  Cardiovascular:     Rate and Rhythm: Normal rate and regular rhythm.     Pulses: Normal pulses.     Heart sounds: Normal heart sounds, S1 normal and S2 normal.  Pulmonary:     Effort: Pulmonary effort is normal.     Breath sounds: Normal breath sounds.  Musculoskeletal:     Comments: Left arm without TTP and with normal ROM No visual deformities  Skin:    General: Skin is warm and dry.  Neurological:     Mental Status: He is alert.     GCS: GCS eye subscore is 4. GCS verbal subscore is 5. GCS motor subscore is 6.     Comments: Area of  reduced sensation to L tricep region Grip strength 5/5 b/l  Psychiatric:        Speech: Speech normal.        Behavior: Behavior normal. Behavior is cooperative.     Assessment and Plan:   Arm numbness left No red flags Denies exertional or anginal sx Concern for possible radiculopathy Will obtain cervical xray and likely refer to sports medicine for further evaluation Patient agreeable to plan  Hypertensive heart disease without heart failure BP above goal -- he did not take medication today No evidence of end-organ damage Recommend taking Telmisartan 80 mg and HCTZ 25 mg today and follow-up with [place] if BP consistently > 130/90 or any other concerns   I,Rachel Rivera,acting as a scribe for [employer], PA.,have documented all relevant documentation on the behalf of Jarold Motto, PA,as directed by  Jarold Motto, PA while in the presence of Jarold Motto, [place].  I, Jarold Motto, [place], have reviewed all documentation for this visit. The documentation on 03/23/23 for the exam, diagnosis, procedures, and orders are all accurate and complete.   Jarold Motto, PA-C

## 2023-03-23 NOTE — Patient Instructions (Signed)
It was great to see you!  An order for xray has been put in for you. To have this done, you can walk in at the [facility] location without a scheduled appointment.  The address is [address]. It is across the street from [facility]. Lab and x-xray are located in the basement.   Hours of operation are M-F 8:30am to 5:00pm.  Please note that they are closed for lunch between 12:30 and 1:00pm.   Take care,  Jozy Mcphearson PA-C  

## 2023-03-24 ENCOUNTER — Encounter: Payer: Self-pay | Admitting: Physician Assistant

## 2023-03-24 ENCOUNTER — Ambulatory Visit: Payer: BC Managed Care – PPO

## 2023-03-24 ENCOUNTER — Other Ambulatory Visit: Payer: Self-pay | Admitting: Physician Assistant

## 2023-03-24 ENCOUNTER — Telehealth: Payer: Self-pay

## 2023-03-24 MED ORDER — AMOXICILLIN 500 MG PO CAPS: 500.0000 mg | ORAL_CAPSULE | Freq: Two times a day (BID) | ORAL | 0 refills | Status: AC

## 2023-03-24 NOTE — Telephone Encounter (Signed)
Pt missed coumadin clinic apt today.  Tried to contact pt but no answer and VM is full. Sent a Radio broadcast assistant.

## 2023-03-24 NOTE — Telephone Encounter (Signed)
Pt reports he thinks he has strep throat. He has a msg into PCP. RS coumadin clinic apt for next week. Advised if any changes to contact coumadin clinic. Pt verbalized understanding.

## 2023-03-25 NOTE — Telephone Encounter (Signed)
Reviewing chart concerning abx. Pt has been prescribed amoxicillin. No interaction with warfarin. Keep next apt as scheduled for coumadin clinic on 5/1.  Tried to contact pt by phone but no answer and VM is full.  Sent a mychart pt.

## 2023-03-31 ENCOUNTER — Ambulatory Visit (INDEPENDENT_AMBULATORY_CARE_PROVIDER_SITE_OTHER): Payer: BC Managed Care – PPO

## 2023-03-31 DIAGNOSIS — Z7901 Long term (current) use of anticoagulants: Secondary | ICD-10-CM | POA: Diagnosis not present

## 2023-03-31 LAB — POCT INR: INR: 2.5 (ref 2.0–3.0)

## 2023-03-31 NOTE — Patient Instructions (Addendum)
Pre visit review using our clinic review tool, if applicable. No additional management support is needed unless otherwise documented below in the visit note.  Continue 2 tablets daily except take 3 tablets on Mondays. Recheck in 6 weeks.  

## 2023-03-31 NOTE — Progress Notes (Signed)
Prescribed amoxicillin for strep throat on 4/24. Pt is well now. Continue 2 tablets daily except take 3 tablets on Mondays. Recheck in 6 weeks.

## 2023-04-14 ENCOUNTER — Other Ambulatory Visit: Payer: Self-pay | Admitting: Physician Assistant

## 2023-04-25 ENCOUNTER — Other Ambulatory Visit: Payer: Self-pay | Admitting: Physician Assistant

## 2023-04-25 DIAGNOSIS — Z7901 Long term (current) use of anticoagulants: Secondary | ICD-10-CM

## 2023-04-28 NOTE — Telephone Encounter (Signed)
Pt is compliant with warfarin management and PCP apts.  Sent in refill of warfarin to requested pharmacy.      

## 2023-05-06 ENCOUNTER — Other Ambulatory Visit: Payer: Self-pay | Admitting: Physician Assistant

## 2023-05-12 ENCOUNTER — Telehealth: Payer: Self-pay | Admitting: Physician Assistant

## 2023-05-12 ENCOUNTER — Ambulatory Visit (INDEPENDENT_AMBULATORY_CARE_PROVIDER_SITE_OTHER): Payer: BC Managed Care – PPO

## 2023-05-12 DIAGNOSIS — Z7901 Long term (current) use of anticoagulants: Secondary | ICD-10-CM | POA: Diagnosis not present

## 2023-05-12 LAB — POCT INR: INR: 2.9 (ref 2.0–3.0)

## 2023-05-12 NOTE — Telephone Encounter (Signed)
Ok with me.  Wesley Hancock. [name], MD 05/12/2023 9:21 AM

## 2023-05-12 NOTE — Telephone Encounter (Signed)
See note

## 2023-05-12 NOTE — Progress Notes (Addendum)
Continue 2 tablets daily except take 3 tablets on Mondays. Recheck in 5 weeks.

## 2023-05-12 NOTE — Patient Instructions (Addendum)
Pre visit review using our clinic review tool, if applicable. No additional management support is needed unless otherwise documented below in the visit note.  Continue 2 tablets daily except take 3 tablets on Mondays. Recheck in 5 weeks.

## 2023-05-12 NOTE — Telephone Encounter (Signed)
Pt is requesting a TOC from [place] to [place]. He would prefer to have an MD. Please advise

## 2023-05-19 ENCOUNTER — Ambulatory Visit: Payer: BC Managed Care – PPO | Admitting: Family Medicine

## 2023-05-19 ENCOUNTER — Encounter: Payer: Self-pay | Admitting: Family Medicine

## 2023-05-19 VITALS — BP 121/84 | HR 83 | Temp 98.0°F | Ht 73.5 in | Wt 316.0 lb

## 2023-05-19 DIAGNOSIS — J849 Interstitial pulmonary disease, unspecified: Secondary | ICD-10-CM

## 2023-05-19 DIAGNOSIS — M329 Systemic lupus erythematosus, unspecified: Secondary | ICD-10-CM

## 2023-05-19 DIAGNOSIS — R7303 Prediabetes: Secondary | ICD-10-CM

## 2023-05-19 DIAGNOSIS — F32A Depression, unspecified: Secondary | ICD-10-CM

## 2023-05-19 DIAGNOSIS — D6861 Antiphospholipid syndrome: Secondary | ICD-10-CM | POA: Diagnosis not present

## 2023-05-19 DIAGNOSIS — E785 Hyperlipidemia, unspecified: Secondary | ICD-10-CM | POA: Diagnosis not present

## 2023-05-19 DIAGNOSIS — Z6841 Body Mass Index (BMI) 40.0 and over, adult: Secondary | ICD-10-CM | POA: Diagnosis not present

## 2023-05-19 DIAGNOSIS — K219 Gastro-esophageal reflux disease without esophagitis: Secondary | ICD-10-CM

## 2023-05-19 DIAGNOSIS — I2510 Atherosclerotic heart disease of native coronary artery without angina pectoris: Secondary | ICD-10-CM | POA: Insufficient documentation

## 2023-05-19 LAB — COMPREHENSIVE METABOLIC PANEL
ALT: 52 U/L (ref 0–53)
AST: 32 U/L (ref 0–37)
Albumin: 4.2 g/dL (ref 3.5–5.2)
Alkaline Phosphatase: 51 U/L (ref 39–117)
BUN: 19 mg/dL (ref 6–23)
CO2: 26 mEq/L (ref 19–32)
Calcium: 9.5 mg/dL (ref 8.4–10.5)
Chloride: 101 mEq/L (ref 96–112)
Creatinine, Ser: 1.1 mg/dL (ref 0.40–1.50)
GFR: 77.93 mL/min (ref >60.00)
Glucose, Bld: 105 mg/dL — ABNORMAL HIGH (ref 70–99)
Potassium: 4.3 mEq/L (ref 3.5–5.1)
Sodium: 137 mEq/L (ref 135–145)
Total Bilirubin: 0.6 mg/dL (ref 0.2–1.2)
Total Protein: 7.8 g/dL (ref 6.0–8.3)

## 2023-05-19 LAB — LIPID PANEL
Cholesterol: 261 mg/dL — ABNORMAL HIGH (ref 0–200)
HDL: 38.7 mg/dL — ABNORMAL LOW (ref >39.00)
NonHDL: 222.51
Total CHOL/HDL Ratio: 7
Triglycerides: 204 mg/dL — ABNORMAL HIGH (ref 0.0–149.0)
VLDL: 40.8 mg/dL — ABNORMAL HIGH (ref 0.0–40.0)

## 2023-05-19 LAB — TSH: TSH: 0.97 u[IU]/mL (ref 0.35–5.50)

## 2023-05-19 LAB — CBC
HCT: 48.8 % (ref 39.0–52.0)
Hemoglobin: 16.1 g/dL (ref 13.0–17.0)
MCHC: 33.1 g/dL (ref 30.0–36.0)
MCV: 90.2 fl (ref 78.0–100.0)
Platelets: 260 10*3/uL (ref 150.0–400.0)
RBC: 5.41 Mil/uL (ref 4.22–5.81)
RDW: 14.1 % (ref 11.5–15.5)
WBC: 6.5 10*3/uL (ref 4.0–10.5)

## 2023-05-19 LAB — LDL CHOLESTEROL, DIRECT: Direct LDL: 187 mg/dL

## 2023-05-19 LAB — HEMOGLOBIN A1C: Hgb A1c MFr Bld: 5.8 % (ref 4.6–6.5)

## 2023-05-19 MED ORDER — ATORVASTATIN CALCIUM 40 MG PO TABS: 40.0000 mg | ORAL_TABLET | Freq: Every day | ORAL | 3 refills | Status: DC

## 2023-05-19 MED ORDER — BUPROPION HCL 75 MG PO TABS: 75.0000 mg | ORAL_TABLET | Freq: Two times a day (BID) | ORAL | 0 refills | Status: DC

## 2023-05-19 NOTE — Assessment & Plan Note (Signed)
Stable on omeprazole 20 mg twice daily.

## 2023-05-19 NOTE — Assessment & Plan Note (Signed)
Anticoagulated with warfarin.  Doing well with this.

## 2023-05-19 NOTE — Assessment & Plan Note (Signed)
Recheck A1c today.  Does have multiple comorbidities and would have great benefit with GLP-1 agonist though this may be difficult in terms of insurance coverage.  If A1c is elevated we will start Mounjaro.  We did discuss potential side effects of this medication today.

## 2023-05-19 NOTE — Assessment & Plan Note (Signed)
Continue management per pulmonology.  On CellCept 1000 mg twice daily and albuterol as needed.

## 2023-05-19 NOTE — Progress Notes (Signed)
Wesley Hancock is a 52 y.o. male who presents today for an office visit.  Assessment/Plan:  Chronic Problems Addressed Today: Morbid obesity ([name]) BMI 41 today with multiple comorbidities including prior stroke, dyslipidemia, prediabetes, hypertension, and cardiovascular disease.  He has not had much benefit with lifestyle interventions.  Would be a candidate for GLP-1 agonist and will be checking labs as below.  APS (antiphospholipid syndrome) ([name]) Anticoagulated with warfarin.  Doing well with this.  Prediabetes Recheck A1c today.  Does have multiple comorbidities and would have great benefit with GLP-1 agonist though this may be difficult in terms of insurance coverage.  If A1c is elevated we will start Mounjaro.  We did discuss potential side effects of this medication today.  Depression Stable on Wellbutrin 75 mg twice daily and Lexapro 20 mg daily.  Dyslipidemia Check lipids.  On Repatha per cardiology.  Also on Lipitor 40 mg daily.  ILD (interstitial lung disease) ([name]) Continue management per pulmonology.  On CellCept 1000 mg twice daily and albuterol as needed.  SLE (systemic lupus erythematosus) ([name]) Following with rheumatology.  On CellCept as above which helps with this.  Coronary artery disease Follows with cardiology.  On statin and Repatha.  Anticoagulated on warfarin.  GERD (gastroesophageal reflux disease) Stable on omeprazole 20 mg twice daily.     Subjective:  HPI:  See A/P for status of chronic conditions.  Patient is here to transfer care.  Main concern today is weight.  He has been working hard on diet and exercise without much improvement.  May be interested in seeing a GLP-1 agonist.  ROS: Per HPI, otherwise a complete review of systems was negative.   PMH:  The following were reviewed and entered/updated in epic: Past Medical History:  Diagnosis Date   Acute nonintractable headache 09/06/2019   Acute respiratory failure with hypoxia  ([name]) 08/30/2019   AKI (acute kidney injury) ([name]) 08/30/2019   Antiphospholipid syndrome ([name]) 10/30/2019   Anxiety    APS (antiphospholipid syndrome) ([name])    Arthritis    Colon polyp    ? hyperplastic   Depression    Diarrhea 05/21/2020   DVT (deep venous thrombosis) ([name])    Dyspnea on exertion 09/08/2017   "Quit smoking" 2015 with onset of symptoms in 2016  Spirometry 09/08/2017  Flat f/v loop  - d/c acei 09/08/2017  - 01/04/2020   Walked RA x two laps =  approx 589ft @ fast pace - stopped due to end of study/ min sob with sats of 93 % at the end of the study. - PFT's  03/08/20   FEV1 3.84 (83 % ) ratio 0..90  p 3 % improvement from saba p ? 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 06/26/20, 03/08/20   Snoring 07/09/2020   Stress fracture    in back, seeing PT   Stroke ([name])    Upper airway cough syndrome 01/05/2020   Onset mid Jan 2021 while on otc PPI  - max rx for gerd 01/04/2020 >>>      Witnessed apneic spells 07/09/2020   Patient Active Problem List   Diagnosis Date Noted   SLE (systemic lupus erythematosus) ([name]) 05/19/2023   Coronary artery  disease 05/19/2023   GERD (gastroesophageal reflux disease) 05/19/2023   Vitamin D deficiency 10/09/2021   ILD (interstitial lung disease) ([name]) 05/27/2021   CVA (cerebral vascular accident) ([name]) 03/20/2021   Prediabetes 01/16/2021   OSA on CPAP 10/11/2020   APS (antiphospholipid syndrome) ([name])    Morbid obesity ([name]) 09/10/2019   Depression 06/24/2017   Dyslipidemia 09/18/2015   Past Surgical History:  Procedure Laterality Date   BRONCHIAL WASHINGS  01/14/2021   Procedure: BRONCHIAL WASHINGS;  Surgeon: Martina Sinner, MD;  Location: [facility];  Service: Pulmonary;;   CHOLECYSTECTOMY     VIDEO BRONCHOSCOPY N/A 01/14/2021   Procedure: VIDEO BRONCHOSCOPY WITHOUT FLUORO;  Surgeon: Martina Sinner, MD;  Location: [facility];  Service: Pulmonary;  Laterality: N/A;    Family History  Problem Relation Age of Onset   Diabetes Mother    Hyperlipidemia Mother    Hypertension Mother    Thyroid disease Mother        uncertain type--had surg--no cancer   Asthma Mother        died from PNA   Heart disease Father 31   Hyperlipidemia Father    Hypertension Father    Prostate cancer Father 50   Lung cancer Father 13       Dx 06/18/2017   Colon polyps Father    Irritable bowel syndrome Father    Diverticulitis Father    Diabetes Sister    Hyperlipidemia Sister    Hypertension Sister    Diabetes Maternal Grandmother    Heart disease Maternal Grandmother    Hyperlipidemia Maternal Grandmother    Hypertension Maternal Grandmother    Colon cancer Maternal Grandmother    Heart disease Maternal Grandfather    Hyperlipidemia Maternal Grandfather    Hypertension Maternal Grandfather    Stroke Maternal Grandfather    Liver cancer Maternal Grandfather    Irritable bowel syndrome Maternal Grandfather    Heart disease Paternal Grandmother    Healthy Daughter    Healthy Daughter    Healthy Son     Medications- reviewed and updated Current Outpatient Medications  Medication  Sig Dispense Refill   acetaminophen (TYLENOL) 500 MG tablet Take 1,000 mg by mouth every 6 (six) hours as needed for mild pain.     albuterol (VENTOLIN HFA) 108 (90 Base) MCG/ACT inhaler Inhale 2 puffs into the lungs every 6 (six) hours as needed for wheezing or shortness of breath. 8 g 6   escitalopram (LEXAPRO) 20 MG tablet TAKE 1 TABLET BY MOUTH EVERY DAY 90 tablet 1   Evolocumab (REPATHA SURECLICK) 140 MG/ML SOAJ Inject 140 mg into the skin every 14 (fourteen) days. 6 mL 3   fluticasone (FLONASE) 50 MCG/ACT nasal spray Place 1 spray into both nostrils daily as needed for allergies.     hydrochlorothiazide (HYDRODIURIL) 25 MG tablet Take 1 tablet (25 mg total) by mouth daily. 90 tablet 3  Multiple Vitamin (MULTIVITAMIN) tablet Take by mouth daily. 2 chews daily     mycophenolate (CELLCEPT) 500 MG tablet TAKE 2 TABLETS BY MOUTH TWICE DAILY 360 tablet 1   omeprazole (PRILOSEC) 20 MG capsule Take 20 mg by mouth 2 (two) times daily before a meal.     telmisartan (MICARDIS) 80 MG tablet Take 1 tablet (80 mg total) by mouth daily. 90 tablet 3   triamcinolone cream (KENALOG) 0.1 % Apply to affected area 1-2 times daily 30 g 0   warfarin (COUMADIN) 5 MG tablet TAKE 2 TABLETS BY MOUTH DAILY EXCEPT TAKE 3 TABLETS ON MONDAYS OR AS DIRECTED BY ANTICOAGULATION CLINIC 200 tablet 1   atorvastatin (LIPITOR) 40 MG tablet Take 1 tablet (40 mg total) by mouth daily. 90 tablet 3   buPROPion (WELLBUTRIN) 75 MG tablet Take 1 tablet (75 mg total) by mouth 2 (two) times daily. 270 tablet 0   No current facility-administered medications for this visit.    Allergies-reviewed and updated Allergies  Allergen Reactions   Diltiazem Hcl Diarrhea and Other (See Comments)    Lethargic     Social History   Socioeconomic History   Marital status: Married    Spouse name: Lauren   Number of children: 3   Years of education: Not on file   Highest education level: Not on file  Occupational History   Occupation:  Truck driver  Tobacco Use   Smoking status: Former    Packs/day: 1.00    Years: 20.00    Additional pack years: 0.00    Total pack years: 20.00    Types: Cigarettes    Quit date: 11/30/2013    Years since quitting: 9.4   Smokeless tobacco: Former    Types: Snuff    Quit date: 05/21/2018  Vaping Use   Vaping Use: Never used  Substance and Sexual Activity   Alcohol use: Not Currently   Drug use: No   Sexual activity: Yes  Other Topics Concern   Not on file  Social History Narrative   Lives with wife and daughter at home   Right Handed   Drinks about 2 cups caffeine daily   Social Determinants of Health   Financial Resource Strain: Not on file  Food Insecurity: Not on file  Transportation Needs: Not on file  Physical Activity: Not on file  Stress: Not on file  Social Connections: Not on file        Objective:  Physical Exam: BP 121/84   Pulse 83   Temp 98 F (36.7 C) (Temporal)   Ht 6' 1.5" (1.867 m)   Wt (!) 316 lb (143.3 kg)   SpO2 97%   BMI 41.13 kg/m   Wt Readings from Last 3 Encounters:  05/19/23 (!) 316 lb (143.3 kg)  03/23/23 (!) 317 lb 4 oz (143.9 kg)  01/19/23 (!) 310 lb (140.6 kg)    Gen: No acute distress, resting comfortably CV: Regular rate and rhythm with no murmurs appreciated Pulm: Normal work of breathing, clear to auscultation bilaterally with no crackles, wheezes, or rhonchi Neuro: Grossly normal, moves all extremities Psych: Normal affect and thought content  Time Spent: 45 minutes of total time was spent on the date of the encounter performing the following actions: chart review prior to seeing the patient including recent visits with specialists and previous PCP, obtaining history, performing a medically necessary exam, counseling on the treatment plan, placing orders, and documenting in our EHR.        [facility]  Doretha Imus, MD 05/19/2023 8:35 AM

## 2023-05-19 NOTE — Assessment & Plan Note (Signed)
Stable on Wellbutrin 75 mg twice daily and Lexapro 20 mg daily.

## 2023-05-19 NOTE — Assessment & Plan Note (Signed)
BMI 41 today with multiple comorbidities including prior stroke, dyslipidemia, prediabetes, hypertension, and cardiovascular disease.  He has not had much benefit with lifestyle interventions.  Would be a candidate for GLP-1 agonist and will be checking labs as below.

## 2023-05-19 NOTE — Assessment & Plan Note (Signed)
Following with rheumatology.  On CellCept as above which helps with this.

## 2023-05-19 NOTE — Patient Instructions (Addendum)
It was very nice to see you today!  We will check blood work today.   We may start a new medication to help with sugar and weight loss depending on your test results.   Return in about 1 year (around 05/18/2024) for Annual Physical.   Take care, Dr [name]  PLEASE NOTE:  If you had any lab tests, please let us know if you have not heard back within a few days. You may see your results on mychart before we have a chance to review them but we will give you a call once they are reviewed by [place].   If we ordered any referrals today, please let us know if you have not heard from their office within the next week.   If you had any urgent prescriptions sent in today, please check with the pharmacy within an hour of our visit to make sure the prescription was transmitted appropriately.   Please try these tips to maintain a healthy lifestyle:  Eat at least 3 REAL meals and 1-2 snacks per day.  Aim for no more than 5 hours between eating.  If you eat breakfast, please do so within one hour of getting up.   Each meal should contain half fruits/vegetables, one quarter protein, and one quarter carbs (no bigger than a computer mouse)  Cut down on sweet beverages. This includes juice, soda, and sweet tea.   Drink at least 1 glass of water with each meal and aim for at least 8 glasses per day  Exercise at least 150 minutes every week.     Preventive Care 51-51 Years Old, Male Preventive care refers to lifestyle choices and visits with your health care provider that can promote health and wellness. Preventive care visits are also called wellness exams. What can I expect for my preventive care visit? Counseling During your preventive care visit, your health care provider may ask about your: Medical history, including: Past medical problems. Family medical history. Current health, including: Emotional well-being. Home life and relationship well-being. Sexual activity. Lifestyle,  including: Alcohol, nicotine or tobacco, and drug use. Access to firearms. Diet, exercise, and sleep habits. Safety issues such as seatbelt and bike helmet use. Sunscreen use. Work and work Astronomer. Physical exam Your health care provider will check your: Height and weight. These may be used to calculate your BMI (body mass index). BMI is a measurement that tells if you are at a healthy weight. Waist circumference. This measures the distance around your waistline. This measurement also tells if you are at a healthy weight and may help predict your risk of certain diseases, such as type 2 diabetes and high blood pressure. Heart rate and blood pressure. Body temperature. Skin for abnormal spots. What immunizations do I need?  Vaccines are usually given at various ages, according to a schedule. Your health care provider will recommend vaccines for you based on your age, medical history, and lifestyle or other factors, such as travel or where you work. What tests do I need? Screening Your health care provider may recommend screening tests for certain conditions. This may include: Lipid and cholesterol levels. Diabetes screening. This is done by checking your blood sugar (glucose) after you have not eaten for a while (fasting). Hepatitis B test. Hepatitis C test. HIV (human immunodeficiency virus) test. STI (sexually transmitted infection) testing, if you are at risk. Lung cancer screening. Prostate cancer screening. Colorectal cancer screening. Talk with your health care provider about your test results, treatment options, and if  necessary, the need for more tests. Follow these instructions at home: Eating and drinking  Eat a diet that includes fresh fruits and vegetables, whole grains, lean protein, and low-fat dairy products. Take vitamin and mineral supplements as recommended by your health care provider. Do not drink alcohol if your health care provider tells you not to  drink. If you drink alcohol: Limit how much you have to 0-2 drinks a day. Know how much alcohol is in your drink. In the [place] one drink equals one 12 oz bottle of beer (355 mL), one 5 oz glass of wine (148 mL), or one 1 oz glass of hard liquor (44 mL). Lifestyle Brush your teeth every morning and night with fluoride toothpaste. Floss one time each day. Exercise for at least 30 minutes 5 or more days each week. Do not use any products that contain nicotine or tobacco. These products include cigarettes, chewing tobacco, and vaping devices, such as e-cigarettes. If you need help quitting, ask your health care provider. Do not use drugs. If you are sexually active, practice safe sex. Use a condom or other form of protection to prevent STIs. Take aspirin only as told by your health care provider. Make sure that you understand how much to take and what form to take. Work with your health care provider to find out whether it is safe and beneficial for you to take aspirin daily. Find healthy ways to manage stress, such as: Meditation, yoga, or listening to music. Journaling. Talking to a trusted person. Spending time with friends and family. Minimize exposure to UV radiation to reduce your risk of skin cancer. Safety Always wear your seat belt while driving or riding in a vehicle. Do not drive: If you have been drinking alcohol. Do not ride with someone who has been drinking. When you are tired or distracted. While texting. If you have been using any mind-altering substances or drugs. Wear a helmet and other protective equipment during sports activities. If you have firearms in your house, make sure you follow all gun safety procedures. What's next? Go to your health care provider once a year for an annual wellness visit. Ask your health care provider how often you should have your eyes and teeth checked. Stay up to date on all vaccines. This information is not intended to replace advice  given to you by your health care provider. Make sure you discuss any questions you have with your health care provider. Document Revised: 05/14/2021 Document Reviewed: 05/14/2021 Elsevier Patient Education  2024 [employer].

## 2023-05-19 NOTE — Assessment & Plan Note (Signed)
Follows with cardiology.  On statin and Repatha.  Anticoagulated on warfarin.

## 2023-05-19 NOTE — Assessment & Plan Note (Signed)
Check lipids.  On Repatha per cardiology.  Also on Lipitor 40 mg daily.

## 2023-05-20 NOTE — Progress Notes (Signed)
His cholesterol still very high.  Please verify that he has been taking the Repatha and Lipitor.  If he has been taking these please increase his Lipitor to 40 mg daily and have him follow-up with cardiology soon to discuss how we can lower this even further.  His A1c is still in the prediabetic range.  We can try sending in [facility] however I am not sure if insurance will pay for this months he has a diabetic range A1c.  The rest of his labs are all stable and we can recheck in a year or so.

## 2023-06-16 ENCOUNTER — Ambulatory Visit: Payer: BC Managed Care – PPO

## 2023-06-21 ENCOUNTER — Other Ambulatory Visit: Payer: Self-pay

## 2023-06-21 ENCOUNTER — Emergency Department (HOSPITAL_BASED_OUTPATIENT_CLINIC_OR_DEPARTMENT_OTHER)
Admission: EM | Admit: 2023-06-21 | Discharge: 2023-06-21 | Disposition: A | Discharge status: Home / Self Care | Payer: BC Managed Care – PPO | Attending: Emergency Medicine | Admitting: Emergency Medicine

## 2023-06-21 ENCOUNTER — Emergency Department (HOSPITAL_BASED_OUTPATIENT_CLINIC_OR_DEPARTMENT_OTHER): Payer: BC Managed Care – PPO | Admitting: Radiology

## 2023-06-21 ENCOUNTER — Encounter (HOSPITAL_BASED_OUTPATIENT_CLINIC_OR_DEPARTMENT_OTHER): Payer: Self-pay | Admitting: Emergency Medicine

## 2023-06-21 DIAGNOSIS — I493 Ventricular premature depolarization: Secondary | ICD-10-CM | POA: Diagnosis not present

## 2023-06-21 DIAGNOSIS — R079 Chest pain, unspecified: Secondary | ICD-10-CM | POA: Diagnosis not present

## 2023-06-21 DIAGNOSIS — R0989 Other specified symptoms and signs involving the circulatory and respiratory systems: Secondary | ICD-10-CM | POA: Diagnosis not present

## 2023-06-21 DIAGNOSIS — Z7901 Long term (current) use of anticoagulants: Secondary | ICD-10-CM | POA: Insufficient documentation

## 2023-06-21 DIAGNOSIS — Z79899 Other long term (current) drug therapy: Secondary | ICD-10-CM | POA: Insufficient documentation

## 2023-06-21 DIAGNOSIS — I10 Essential (primary) hypertension: Secondary | ICD-10-CM | POA: Diagnosis not present

## 2023-06-21 DIAGNOSIS — R0789 Other chest pain: Secondary | ICD-10-CM | POA: Diagnosis not present

## 2023-06-21 DIAGNOSIS — R072 Precordial pain: Secondary | ICD-10-CM | POA: Insufficient documentation

## 2023-06-21 DIAGNOSIS — R030 Elevated blood-pressure reading, without diagnosis of hypertension: Secondary | ICD-10-CM

## 2023-06-21 LAB — COMPREHENSIVE METABOLIC PANEL
ALT: 38 U/L (ref 0–44)
AST: 25 U/L (ref 15–41)
Albumin: 4 g/dL (ref 3.5–5.0)
Alkaline Phosphatase: 48 U/L (ref 38–126)
Anion gap: 9 (ref 5–15)
BUN: 14 mg/dL (ref 6–20)
CO2: 28 mmol/L (ref 22–32)
Calcium: 9.2 mg/dL (ref 8.9–10.3)
Chloride: 101 mmol/L (ref 98–111)
Creatinine, Ser: 1.08 mg/dL (ref 0.61–1.24)
GFR, Estimated: >60 mL/min (ref >60)
Glucose, Bld: 120 mg/dL — ABNORMAL HIGH (ref 70–99)
Potassium: 3.6 mmol/L (ref 3.5–5.1)
Sodium: 138 mmol/L (ref 135–145)
Total Bilirubin: 0.7 mg/dL (ref 0.3–1.2)
Total Protein: 7.6 g/dL (ref 6.5–8.1)

## 2023-06-21 LAB — PROTIME-INR
INR: 2.1 — ABNORMAL HIGH (ref 0.8–1.2)
Prothrombin Time: 23.9 seconds — ABNORMAL HIGH (ref 11.4–15.2)

## 2023-06-21 LAB — CBC
HCT: 47.1 % (ref 39.0–52.0)
Hemoglobin: 16 g/dL (ref 13.0–17.0)
MCH: 30.1 pg (ref 26.0–34.0)
MCHC: 34 g/dL (ref 30.0–36.0)
MCV: 88.5 fL (ref 80.0–100.0)
Platelets: 236 10*3/uL (ref 150–400)
RBC: 5.32 MIL/uL (ref 4.22–5.81)
RDW: 13.4 % (ref 11.5–15.5)
WBC: 6.8 10*3/uL (ref 4.0–10.5)
nRBC: 0 % (ref 0.0–0.2)

## 2023-06-21 LAB — TROPONIN I (HIGH SENSITIVITY): Troponin I (High Sensitivity): 4 ng/L (ref <18)

## 2023-06-21 LAB — APTT: aPTT: 51 seconds — ABNORMAL HIGH (ref 24–36)

## 2023-06-21 MED ORDER — ALUM & MAG HYDROXIDE-SIMETH 200-200-20 MG/5ML PO SUSP
30.0000 mL | Freq: Once | ORAL | Status: AC
  Administered 2023-06-21: 30 mL via ORAL
  Filled 2023-06-21: qty 30

## 2023-06-21 MED ORDER — ACETAMINOPHEN 500 MG PO TABS
1000.0000 mg | ORAL_TABLET | Freq: Once | ORAL | Status: AC
  Administered 2023-06-21: 1000 mg via ORAL
  Filled 2023-06-21: qty 2

## 2023-06-21 MED ORDER — FAMOTIDINE 20 MG PO TABS
20.0000 mg | ORAL_TABLET | Freq: Once | ORAL | Status: AC
  Administered 2023-06-21: 20 mg via ORAL
  Filled 2023-06-21: qty 1

## 2023-06-21 NOTE — ED Provider Notes (Signed)
[place] EMERGENCY DEPARTMENT AT [facility] Provider Note   CSN: [id number] Arrival date & time: 06/21/23  6578     History  Chief Complaint  Patient presents with   Chest Pain    Wesley Hancock is a 52 y.o. male.  Pt c/o pain to lateral left chest/axillary area for past three days. Symptoms at rest, occasional radiate towards arm. No associated nv, diaphoresis or sob. No chest  wall injury or strain. States some heartburn feeling but that is normal for him. Denies exertional chest pain. No unusual doe or sob. No pleuritic pain. States compliant w home meds including coumadin. Remote hx PE. Hx non-obstructive cad. No leg pain or swelling.   The history is provided by the patient and medical records.  Chest Pain Associated symptoms: no abdominal pain, no back pain, no cough, no fever, no headache, no nausea, no palpitations, no shortness of breath and no vomiting        Home Medications Prior to Admission medications   Medication Sig Start Date End Date Taking? Authorizing Provider  acetaminophen (TYLENOL) 500 MG tablet Take 1,000 mg by mouth every 6 (six) hours as needed for mild pain.    [provider]  albuterol (VENTOLIN HFA) 108 (90 Base) MCG/ACT inhaler Inhale 2 puffs into the lungs every 6 (six) hours as needed for wheezing or shortness of breath. 04/22/21   Martina Sinner, MD  atorvastatin (LIPITOR) 40 MG tablet Take 1 tablet (40 mg total) by mouth daily. 05/19/23   Ardith Dark, MD  buPROPion (WELLBUTRIN) 75 MG tablet Take 1 tablet (75 mg total) by mouth 2 (two) times daily. 05/19/23   Ardith Dark, MD  escitalopram (LEXAPRO) 20 MG tablet TAKE 1 TABLET BY MOUTH EVERY DAY 02/23/23   Jarold Motto, PA  Evolocumab (REPATHA SURECLICK) 140 MG/ML SOAJ Inject 140 mg into the skin every 14 (fourteen) days. 10/01/22   Nahser, Deloris Ping, MD  fluticasone (FLONASE) 50 MCG/ACT nasal spray Place 1 spray into both nostrils daily as needed for allergies.     [provider]  hydrochlorothiazide (HYDRODIURIL) 25 MG tablet Take 1 tablet (25 mg total) by mouth daily. 09/30/22   Swinyer, Zachary George, NP  Multiple Vitamin (MULTIVITAMIN) tablet Take by mouth daily. 2 chews daily    [provider]  mycophenolate (CELLCEPT) 500 MG tablet TAKE 2 TABLETS BY MOUTH TWICE DAILY 02/03/23   Jarold Motto, PA  omeprazole (PRILOSEC) 20 MG capsule Take 20 mg by mouth 2 (two) times daily before a meal.    [provider]  telmisartan (MICARDIS) 80 MG tablet Take 1 tablet (80 mg total) by mouth daily. 09/30/22   Swinyer, Zachary George, NP  triamcinolone cream (KENALOG) 0.1 % Apply to affected area 1-2 times daily 06/11/22   Jarold Motto, PA  warfarin (COUMADIN) 5 MG tablet TAKE 2 TABLETS BY MOUTH DAILY EXCEPT TAKE 3 TABLETS ON MONDAYS OR AS DIRECTED BY ANTICOAGULATION CLINIC 04/28/23   Jarold Motto, PA      Allergies    Diltiazem hcl    Review of Systems   Review of Systems  Constitutional:  Negative for fever.  HENT:  Negative for sore throat.   Eyes:  Negative for redness.  Respiratory:  Negative for cough and shortness of breath.   Cardiovascular:  Positive for chest pain. Negative for palpitations and leg swelling.  Gastrointestinal:  Negative for abdominal pain, nausea and vomiting.  Genitourinary:  Negative for flank pain.  Musculoskeletal:  Negative for  back pain and neck pain.  Skin:  Negative for rash.  Neurological:  Negative for headaches.    Physical Exam Updated Vital Signs BP 139/77 (BP Location: Right Arm)   Pulse 69   Temp 98.5 F (36.9 C) (Oral)   Resp 17   SpO2 96%  Physical Exam Vitals and nursing note reviewed.  Constitutional:      Appearance: Normal appearance. He is well-developed.  HENT:     Head: Atraumatic.     Nose: Nose normal.     Mouth/Throat:     Mouth: Mucous membranes are moist.  Eyes:     General: No scleral icterus.    Conjunctiva/sclera: Conjunctivae normal.  Neck:     Trachea:  No tracheal deviation.  Cardiovascular:     Rate and Rhythm: Normal rate and regular rhythm.     Pulses: Normal pulses.     Heart sounds: Normal heart sounds. No murmur heard.    No friction rub. No gallop.  Pulmonary:     Effort: Pulmonary effort is normal. No accessory muscle usage or respiratory distress.     Breath sounds: Normal breath sounds.  Chest:     Chest wall: No tenderness.  Abdominal:     General: There is no distension.     Palpations: Abdomen is soft.     Tenderness: There is no abdominal tenderness.  Musculoskeletal:        General: No swelling.     Cervical back: Normal range of motion and neck supple. No rigidity.     Right lower leg: No edema.     Left lower leg: No edema.     Comments: Good rom left shoulder without pain. No swelling to left chest, axillary area, or shoulder. No skin changes, lesions or erythema. LUE of normal color and warmth, intact radial pulse. No LUE swelling. No leg pain or swelling.   Skin:    General: Skin is warm and dry.     Findings: No rash.  Neurological:     Mental Status: He is alert.     Comments: Alert, speech clear.   Psychiatric:        Mood and Affect: Mood normal.     ED Results / Procedures / Treatments   Labs (all labs ordered are listed, but only abnormal results are displayed) Results for orders placed or performed during the hospital encounter of 06/21/23  CBC  Result Value Ref Range   WBC 6.8 4.0 - 10.5 K/uL   RBC 5.32 4.22 - 5.81 MIL/uL   Hemoglobin 16.0 13.0 - 17.0 g/dL   HCT 16.1 09.6 - 04.5 %   MCV 88.5 80.0 - 100.0 fL   MCH 30.1 26.0 - 34.0 pg   MCHC 34.0 30.0 - 36.0 g/dL   RDW 40.9 81.1 - 91.4 %   Platelets 236 150 - 400 K/uL   nRBC 0.0 0.0 - 0.2 %  Protime-INR  Result Value Ref Range   Prothrombin Time 23.9 (H) 11.4 - 15.2 seconds   INR 2.1 (H) 0.8 - 1.2  APTT  Result Value Ref Range   aPTT 51 (H) 24 - 36 seconds  Comprehensive metabolic panel  Result Value Ref Range   Sodium 138 135 - 145  mmol/L   Potassium 3.6 3.5 - 5.1 mmol/L   Chloride 101 98 - 111 mmol/L   CO2 28 22 - 32 mmol/L   Glucose, Bld 120 (H) 70 - 99 mg/dL   BUN 14 6 - 20 mg/dL  Creatinine, Ser 1.08 0.61 - 1.24 mg/dL   Calcium 9.2 8.9 - 19.1 mg/dL   Total Protein 7.6 6.5 - 8.1 g/dL   Albumin 4.0 3.5 - 5.0 g/dL   AST 25 15 - 41 U/L   ALT 38 0 - 44 U/L   Alkaline Phosphatase 48 38 - 126 U/L   Total Bilirubin 0.7 0.3 - 1.2 mg/dL   GFR, Estimated >47 >82 mL/min   Anion gap 9 5 - 15  Troponin I (High Sensitivity)  Result Value Ref Range   Troponin I (High Sensitivity) 4 <18 ng/L    EKG EKG Interpretation Date/Time:  Monday June 21 2023 09:27:00 EDT Ventricular Rate:  68 PR Interval:  163 QRS Duration:  99 QT Interval:  409 QTC Calculation: 435 R Axis:   20  Text Interpretation: Sinus rhythm Confirmed by Cathren Laine ([id number]) on 06/21/2023 11:24:13 AM  Radiology DG Chest 2 View  Result Date: 06/21/2023 CLINICAL DATA:  chest pain EXAM: CHEST - 2 VIEW COMPARISON:  02/08/2022. FINDINGS: Low lung volume. Bilateral lung fields are clear. Bilateral costophrenic angles are clear. Normal cardio-mediastinal silhouette. No acute osseous abnormalities. The soft tissues are within normal limits. There are surgical clips in the right upper quadrant, typical of a previous cholecystectomy. IMPRESSION: No active cardiopulmonary disease. Electronically Signed   By: Jules Schick M.D.   On: 06/21/2023 10:27    Procedures Procedures    Medications Ordered in ED Medications  acetaminophen (TYLENOL) tablet 1,000 mg (1,000 mg Oral Given 06/21/23 1010)  famotidine (PEPCID) tablet 20 mg (20 mg Oral Given 06/21/23 1010)  alum & mag hydroxide-simeth (MAALOX/MYLANTA) 200-200-20 MG/5ML suspension 30 mL (30 mLs Oral Given 06/21/23 1011)    ED Course/ Medical Decision Making/ A&P                             Medical Decision Making Problems Addressed: Anticoagulated on Coumadin: chronic illness or injury Elevated blood  pressure reading: acute illness or injury Essential hypertension: chronic illness or injury with exacerbation, progression, or side effects of treatment that poses a threat to life or bodily functions Precordial chest pain: acute illness or injury with systemic symptoms that poses a threat to life or bodily functions PVC's (premature ventricular contractions): acute illness or injury  Amount and/or Complexity of Data Reviewed External Data Reviewed: notes. Labs: ordered. Decision-making details documented in ED Course. Radiology: ordered and independent interpretation performed. Decision-making details documented in ED Course. ECG/medicine tests: ordered and independent interpretation performed. Decision-making details documented in ED Course.  Risk OTC drugs. Decision regarding hospitalization.   Iv ns. Continuous pulse ox and cardiac monitoring. Labs ordered/sent. Imaging ordered.   Differential diagnosis includes ACS, msk cp, gi cp, etc. Dispo decision including potential need for admission considered - will get labs and imaging and reassess.   Reviewed nursing notes and prior charts for additional history. External reports reviewed. Prior cardiac ct with non-obstructive ca. Hx PE, although last six CTA PE studies negative for PE. Pt indicates compliant w coumadin therapy.   Cardiac monitor: sinus rhythm, rate 74.  Labs reviewed/interpreted by me - trop normal - after symptoms constant for three days, felt not c/w acs.  Wbc and hgb normal.   Occasional pvc on monitor.   Xrays reviewed/interpreted by me - no pna.   Pt breathing comfortably, rr 16, pulse ox currently 99%. No distress.  Pt currently appears stable for d/c. Rec close outpatient pcp/cardiology f/u.  Return precautions provided.          Final Clinical Impression(s) / ED Diagnoses Final diagnoses:  Precordial chest pain  Elevated blood pressure reading  Essential hypertension  Anticoagulated on Coumadin   PVC's (premature ventricular contractions)    Rx / DC Orders ED Discharge Orders          Ordered    Ambulatory referral to Cardiology       Comments: If you have not heard from the Cardiology office within the next 72 hours please call [phone].   06/21/23 1136              Cathren Laine, MD 06/21/23 1137

## 2023-06-21 NOTE — Discharge Instructions (Addendum)
It was our pleasure to provide your ER care today - we hope that you feel better.  Follow up closely with your doctor/cardiologist in the next 1-2 weeks - we did make referral, they should be contacting you with an appointment. Also follow up regarding your blood pressure that is mildly high today.   Your INR is 2.1 - discuss with your doctor whether or not they want to adjust your coumadin dose.   Return to ER if worse, new symptoms, recurrent/persistent chest pain, increased trouble breathing, or other concern.

## 2023-06-21 NOTE — ED Triage Notes (Signed)
Pt arrives to ED with c/o left sided chest pain x4 days while at work. Pt notes it seems like to CP is located in his armpit. Pt notes radiation and numbness to right arm this morning.

## 2023-06-23 ENCOUNTER — Ambulatory Visit (INDEPENDENT_AMBULATORY_CARE_PROVIDER_SITE_OTHER): Payer: BC Managed Care – PPO

## 2023-06-23 DIAGNOSIS — Z7901 Long term (current) use of anticoagulants: Secondary | ICD-10-CM

## 2023-06-23 LAB — POCT INR: INR: 2.8 (ref 2.0–3.0)

## 2023-06-23 NOTE — Progress Notes (Signed)
I have reviewed the patient's encounter and agree with the documentation.  Katina Degree. [name], MD 06/23/2023 9:06 AM

## 2023-06-23 NOTE — Progress Notes (Signed)
Pt in ER for precordial chest pain on 7/22. ER unsure of cause of chest pain.  Continue 2 tablets daily except take 3 tablets on Mondays. Recheck in 5 weeks.

## 2023-06-23 NOTE — Patient Instructions (Addendum)
Pre visit review using our clinic review tool, if applicable. No additional management support is needed unless otherwise documented below in the visit note.  Continue 2 tablets daily except take 3 tablets on Mondays. Recheck in 5 weeks.

## 2023-06-30 ENCOUNTER — Encounter (INDEPENDENT_AMBULATORY_CARE_PROVIDER_SITE_OTHER): Payer: Self-pay

## 2023-07-07 NOTE — Progress Notes (Signed)
Office Visit    Patient Name: Wesley Hancock Date of Encounter: 07/07/2023  Primary Care Provider:  Ardith Dark, MD Primary Cardiologist:  Norman Herrlich, MD Primary Electrophysiologist: None   Past Medical History    Past Medical History:  Diagnosis Date   Acute nonintractable headache 09/06/2019   Acute respiratory failure with hypoxia ([name]) 08/30/2019   AKI (acute kidney injury) ([name]) 08/30/2019   Antiphospholipid syndrome ([name]) 10/30/2019   Anxiety    APS (antiphospholipid syndrome) ([name])    Arthritis    Colon polyp    ? hyperplastic   Depression    Diarrhea 05/21/2020   DVT (deep venous thrombosis) ([name])    Dyspnea on exertion 09/08/2017   "Quit smoking" 2015 with onset of symptoms in 2016  Spirometry 09/08/2017  Flat f/v loop  - d/c acei 09/08/2017  - 01/04/2020   Walked RA x two laps =  approx 517ft @ fast pace - stopped due to end of study/ min sob with sats of 93 % at the end of the study. - PFT's  03/08/20   FEV1 3.84 (83 % ) ratio 0..90  p 3 % improvement from saba p ? 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 06/26/20, 03/08/20    Snoring 07/09/2020   Stress fracture    in back, seeing PT   Stroke ([name])    Upper airway cough syndrome 01/05/2020   Onset mid Jan 2021 while on otc PPI  - max rx for gerd 01/04/2020 >>>      Witnessed apneic spells 07/09/2020   Past Surgical History:  Procedure Laterality Date   BRONCHIAL WASHINGS  01/14/2021   Procedure: BRONCHIAL WASHINGS;  Surgeon: Martina Sinner, MD;  Location: [facility];  Service: Pulmonary;;   CHOLECYSTECTOMY     VIDEO BRONCHOSCOPY N/A 01/14/2021   Procedure: VIDEO BRONCHOSCOPY WITHOUT FLUORO;  Surgeon: Martina Sinner, MD;  Location: [facility];  Service: Pulmonary;  Laterality: N/A;    Allergies  Allergies  Allergen Reactions   Diltiazem Hcl Diarrhea and Other (See Comments)    Lethargic      History of Present Illness    Wesley Hancock  is a 52 year old male with a PMH of premature CAD, coronary artery calcifications, essential hypertension, PE (on chronic warfarin), protein S deficiency, HLD, TIA, OSA (on CPAP), lupus, CVA with acute infarct left frontal and insular region 02/2021 who presents today for complaint of chest pain.  Wesley Hancock was seen initially by Dr. Dulce Sellar in 2021 evaluation of shortness of breath.  He underwent a CT in 06/2020 that showed  coronary calcifications and myocardial perfusion test 07/2020 showing EF of 59% with normal perfusion.  2D echo was also completed showing EF of 60 to 65% with mild concentric LVH.  He was last seen by Dr. Dulce Sellar on 09/2020 with recommendation to follow-up as needed.  He was seen in follow-up most recently by Eligha Bridegroom, NP on 09/30/2022 left-sided chest pain and shoulder discomfort.  He reported intermittent frequency of pain and also has dyspnea and occasional episodes of nausea.  CTA results showed calcium score of 618 with calcified plaque and possible stenosis and D1 that was checked by FFR which revealed nonobstructive CAD.  Since last being seen in the office patient reports  that he is doing well today with no chest pain complaints however did experience episodes of discomfort which prompted a ED visit 4 weeks ago.  He reports that during his visit he had significant PVCs with dizziness and sweating.  He does also report starting atorvastatin and correlated some of his symptoms with possible myalgia like discomfort in his arm and shoulder.  His blood pressure today is controlled at 120/84 and heart rate is slightly elevated at 94 bpm.  He reports compliance with his current medication regimen and denies any adverse reactions.  He is currently having some insurance difficulty and has not been able to obtain Repatha and is working on getting this resolved.  He reports now that he is asymptomatic regarding his palpitations.  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  During today's visit we discussed the importance of primary and secondary prevention in regards to his high family history of coronary disease.  Patient denies chest pain, palpitations, dyspnea, PND, orthopnea, nausea, vomiting, dizziness, syncope, edema, weight gain, or early satiety.   Home Medications    Current Outpatient Medications  Medication Sig Dispense Refill   acetaminophen (TYLENOL) 500 MG tablet Take 1,000 mg by mouth every 6 (six) hours as needed for mild pain.     albuterol (VENTOLIN HFA) 108 (90 Base) MCG/ACT inhaler Inhale 2 puffs into the lungs every 6 (six) hours as needed for wheezing or shortness of breath. 8 g 6   atorvastatin (LIPITOR) 40 MG tablet Take 1 tablet (40 mg total) by mouth daily. 90 tablet 3   buPROPion (WELLBUTRIN) 75 MG tablet Take 1 tablet (75 mg total) by mouth 2 (two) times daily. 270 tablet 0   escitalopram (LEXAPRO) 20 MG tablet TAKE 1 TABLET BY MOUTH EVERY DAY 90 tablet 1   Evolocumab (REPATHA SURECLICK) 140 MG/ML SOAJ Inject 140 mg into the skin every 14 (fourteen) days. 6 mL 3   fluticasone (FLONASE) 50  MCG/ACT nasal spray Place 1 spray into both nostrils daily as needed for allergies.     hydrochlorothiazide (HYDRODIURIL) 25 MG tablet Take 1 tablet (25 mg total) by mouth daily. 90 tablet 3   Multiple Vitamin (MULTIVITAMIN) tablet Take by mouth daily. 2 chews daily     mycophenolate (CELLCEPT) 500 MG tablet TAKE 2 TABLETS BY MOUTH TWICE DAILY 360 tablet 1   omeprazole (PRILOSEC) 20 MG capsule Take 20 mg by mouth 2 (two) times daily before a meal.     telmisartan (MICARDIS) 80 MG tablet Take 1 tablet (80 mg total) by mouth daily. 90 tablet 3   triamcinolone cream (KENALOG) 0.1 % Apply to affected area 1-2 times daily 30 g 0   warfarin (COUMADIN) 5 MG tablet TAKE 2 TABLETS BY MOUTH DAILY EXCEPT  TAKE 3 TABLETS ON MONDAYS OR AS DIRECTED BY ANTICOAGULATION CLINIC 200 tablet 1   No current facility-administered medications for this visit.     Review of Systems  Please see the history of present illness.    (+) Palpitations, dizziness (+) Muscle aches  All other systems reviewed and are otherwise negative except as noted above.  Physical Exam    Wt Readings from Last 3 Encounters:  05/19/23 (!) 316 lb (143.3 kg)  03/23/23 (!) 317 lb 4 oz (143.9 kg)  01/19/23 (!) 310 lb (140.6 kg)   [id number]:VOZDG were no vitals filed for this visit.,There is no height or weight on file to calculate BMI.  Constitutional:      Appearance: Healthy appearance. Not in distress.  Neck:     Vascular: JVD normal.  Pulmonary:     Effort: Pulmonary effort is normal.     Breath sounds: No wheezing. No rales. Diminished in the bases Cardiovascular:     Normal rate. Regular rhythm. Normal S1. Normal S2.      Murmurs: There is no murmur.  Edema:    Peripheral edema absent.  Abdominal:     Palpations: Abdomen is soft non tender. There is no hepatomegaly.  Skin:    General: Skin is warm and dry.  Neurological:     General: No focal deficit present.     Mental Status: Alert and oriented to person, place and time.      Cranial Nerves: Cranial nerves are intact.  EKG/LABS/ Recent Cardiac Studies    ECG personally reviewed by me today -none completed today.     Lab Results  Component Value Date   WBC 6.8 06/21/2023   HGB 16.0 06/21/2023   HCT 47.1 06/21/2023   MCV 88.5 06/21/2023   PLT 236 06/21/2023   Lab Results  Component Value Date   CREATININE 1.08 06/21/2023   BUN 14 06/21/2023   NA 138 06/21/2023   K 3.6 06/21/2023   CL 101 06/21/2023   CO2 28 06/21/2023   Lab Results  Component Value Date   ALT 38 06/21/2023   AST 25 06/21/2023   ALKPHOS 48 06/21/2023   BILITOT 0.7 06/21/2023   Lab Results  Component Value Date   CHOL 261 (H) 05/19/2023   HDL 38.70 (L) 05/19/2023   LDLCALC 147 (H) 09/30/2022   LDLDIRECT 187.0 05/19/2023   TRIG 204.0 (H) 05/19/2023   CHOLHDL 7 05/19/2023    Lab Results  Component Value Date   HGBA1C 5.8 05/19/2023     Assessment & Plan    1.  Chest pain: -Today patient reports no chest pain similar to his previous discomfort felt 1 month ago. -He is aware of the warning signs of possible MI and was provided as needed Nitrostat 0.4 mg today. -Patient notes that during his ED visit he felt lots of PVCs. -He will wear 7-day ZIO monitor to evaluate for PVC burden.  2.  Coronary artery calcifications: -Patient currently experiencing myalgias with atorvastatin and we will discontinue atorvastatin today and try Crestor 20 mg daily. -Continue Repatha -We will check LFTs and lipids in 8 weeks -Patient advised to contact us if he develops ongoing myalgias with new statin medication.  3.  Hyperlipidemia: -Please see recommendations from #2 -Patient's last LDL cholesterol was 147 -We will check LP(a) today due to significant family history -Patient advised to continue low-sodium heart healthy diet and increase physical activity 150 minutes/week.  4.  Essential hypertension: -Patient blood pressure today was controlled  at 120/84 -Continue HCTZ 25 mg  daily and telmisartan 80 mg daily  5.  History of CVA/chronic PE: -Patient reports no residual effects from CVA -Continue Coumadin per INR clinic   6.  Frequent PVCs: -We will have patient wear a 7-day ZIO monitor -Patient was advised to reduce triggers for palpitations such as alcohol, emotional stress, or caffeine. -Zio patch is benign we will pursue possible nuclear PET stress test.   Disposition: Follow-up with Norman Herrlich, MD or APP in 2 months    Medication Adjustments/Labs and Tests Ordered: Current medicines are reviewed at length with the patient today.  Concerns regarding medicines are outlined above.   Signed, Napoleon Form, Leodis Rains, NP 07/07/2023, 9:49 AM [facility]

## 2023-07-08 ENCOUNTER — Other Ambulatory Visit: Payer: Self-pay | Admitting: Physician Assistant

## 2023-07-09 ENCOUNTER — Ambulatory Visit (INDEPENDENT_AMBULATORY_CARE_PROVIDER_SITE_OTHER): Payer: BC Managed Care – PPO

## 2023-07-09 ENCOUNTER — Ambulatory Visit: Payer: BC Managed Care – PPO | Admitting: Nurse Practitioner

## 2023-07-09 ENCOUNTER — Encounter: Payer: Self-pay | Admitting: Nurse Practitioner

## 2023-07-09 VITALS — BP 120/84 | HR 94 | Ht 73.5 in | Wt 312.0 lb

## 2023-07-09 DIAGNOSIS — R002 Palpitations: Secondary | ICD-10-CM | POA: Diagnosis not present

## 2023-07-09 DIAGNOSIS — R079 Chest pain, unspecified: Secondary | ICD-10-CM | POA: Diagnosis not present

## 2023-07-09 DIAGNOSIS — I10 Essential (primary) hypertension: Secondary | ICD-10-CM | POA: Diagnosis not present

## 2023-07-09 DIAGNOSIS — I2583 Coronary atherosclerosis due to lipid rich plaque: Secondary | ICD-10-CM | POA: Diagnosis not present

## 2023-07-09 DIAGNOSIS — I2510 Atherosclerotic heart disease of native coronary artery without angina pectoris: Secondary | ICD-10-CM

## 2023-07-09 DIAGNOSIS — I493 Ventricular premature depolarization: Secondary | ICD-10-CM

## 2023-07-09 DIAGNOSIS — I639 Cerebral infarction, unspecified: Secondary | ICD-10-CM

## 2023-07-09 DIAGNOSIS — E785 Hyperlipidemia, unspecified: Secondary | ICD-10-CM

## 2023-07-09 LAB — BASIC METABOLIC PANEL
BUN/Creatinine Ratio: 12 (ref 9–20)
BUN: 14 mg/dL (ref 6–24)
CO2: 26 mmol/L (ref 20–29)
Calcium: 9.7 mg/dL (ref 8.7–10.2)
Chloride: 99 mmol/L (ref 96–106)
Creatinine, Ser: 1.2 mg/dL (ref 0.76–1.27)
Glucose: 106 mg/dL — ABNORMAL HIGH (ref 70–99)
Potassium: 3.9 mmol/L (ref 3.5–5.2)
Sodium: 137 mmol/L (ref 134–144)
eGFR: 73 mL/min/{1.73_m2} (ref >59)

## 2023-07-09 LAB — MAGNESIUM: Magnesium: 1.9 mg/dL (ref 1.6–2.3)

## 2023-07-09 LAB — LIPOPROTEIN A (LPA)

## 2023-07-09 MED ORDER — NITROGLYCERIN 0.4 MG SL SUBL: 0.4000 mg | SUBLINGUAL_TABLET | SUBLINGUAL | 3 refills | Status: DC | PRN

## 2023-07-09 MED ORDER — HYDROCHLOROTHIAZIDE 25 MG PO TABS: 25.0000 mg | ORAL_TABLET | Freq: Every day | ORAL | 2 refills | Status: AC

## 2023-07-09 MED ORDER — ROSUVASTATIN CALCIUM 20 MG PO TABS: 20.0000 mg | ORAL_TABLET | Freq: Every day | ORAL | 1 refills | Status: AC

## 2023-07-09 NOTE — Progress Notes (Unsigned)
Applied a 14 day ZIo XT monitor to patient in the office  [place] to read

## 2023-07-09 NOTE — Patient Instructions (Addendum)
Medication Instructions:  STOP Lipitor (atorvastatin) START Crestor (rosuvastatin) 20mg  Take 1 tablet once a day  START Nitroglycerin 0.4mg  Take 1 as needed for emergency chest pain. Take first dose for emergency chest pain; WAIT 5 minutes and if still having chest pain CALL 911 and then take 2nd dose. IF still having pain wait an additional 5 minutes before taking final dose. Do not take more than 3 doses in a day. *If you need a refill on your cardiac medications before your next appointment, please call your pharmacy*   Lab Work: TODAY-BMET, MAG, LP(a) Speak with your siblings about having the LPa lab done as well 8 weeks-LIPDS & LFT If you have labs (blood work) drawn today and your tests are completely normal, you will receive your results only by: MyChart Message (if you have MyChart) OR A paper copy in the mail If you have any lab test that is abnormal or we need to change your treatment, we will call you to review the results.   Testing/Procedures: Christena Deem- Long Term Monitor Instructions  Your physician has requested you wear a ZIO patch monitor for 14 days.  This is a single patch monitor. [employer] supplies one patch monitor per enrollment. Additional stickers are not available. Please do not apply patch if you will be having a Nuclear Stress Test,  Echocardiogram, Cardiac CT, MRI, or Chest Xray during the period you would be wearing the  monitor. The patch cannot be worn during these tests. You cannot remove and re-apply the  ZIO XT patch monitor.  Your ZIO patch monitor will be mailed 3 day USPS to your address on file. It may take 3-5 days  to receive your monitor after you have been enrolled.  Once you have received your monitor, please review the enclosed instructions. Your monitor  has already been registered assigning a specific monitor serial # to you.  Billing and Patient Assistance Program Information  We have supplied [employer] with any of your insurance information  on file for billing purposes. [employer] offers a sliding scale Patient Assistance Program for patients that do not have  insurance, or whose insurance does not completely cover the cost of the ZIO monitor.  You must apply for the Patient Assistance Program to qualify for this discounted rate.  To apply, please call [employer] at [phone], select option 4, select option 2, ask to apply for  Patient Assistance Program. Meredeth Ide will ask your household income, and how many people  are in your household. They will quote your out-of-pocket cost based on that information.  [employer] will also be able to set up a 45-month, interest-free payment plan if needed.  Applying the monitor   Shave hair from upper left chest.  Hold abrader disc by orange tab. Rub abrader in 40 strokes over the upper left chest as  indicated in your monitor instructions.  Clean area with 4 enclosed alcohol pads. Let dry.  Apply patch as indicated in monitor instructions. Patch will be placed under collarbone on left  side of chest with arrow pointing upward.  Rub patch adhesive wings for 2 minutes. Remove white label marked "1". Remove the white  label marked "2". Rub patch adhesive wings for 2 additional minutes.  While looking in a mirror, press and release button in center of patch. A small green light will  flash 3-4 times. This will be your only indicator that the monitor has been turned on.  Do not shower for the first 24 hours. You may shower  after the first 24 hours.  Press the button if you feel a symptom. You will hear a small click. Record Date, Time and  Symptom in the Patient Logbook.  When you are ready to remove the patch, follow instructions on the last 2 pages of Patient  Logbook. Stick patch monitor onto the last page of Patient Logbook.  Place Patient Logbook in the blue and white box. Use locking tab on box and tape box closed  securely. The blue and white box has prepaid postage on it. Please place it in  the mailbox as  soon as possible. Your physician should have your test results approximately 7 days after the  monitor has been mailed back to [facility].  Call [facility] Customer Care at [phone] if you have questions regarding  your ZIO XT patch monitor. Call them immediately if you see an orange light blinking on your  monitor.  If your monitor falls off in less than 4 days, contact our Monitor department at [phone].  If your monitor becomes loose or falls off after 4 days call [employer] at [phone] for  suggestions on securing your monitor   Follow-Up: At [facility], you and your health needs are our priority.  As part of our continuing mission to provide you with exceptional heart care, we have created designated Provider Care Teams.  These Care Teams include your primary Cardiologist (physician) and Advanced Practice Providers (APPs -  Physician Assistants and Nurse Practitioners) who all work together to provide you with the care you need, when you need it.  We recommend signing up for the patient portal called "MyChart".  Sign up information is provided on this After Visit Summary.  MyChart is used to connect with patients for Virtual Visits (Telemedicine).  Patients are able to view lab/test results, encounter notes, upcoming appointments, etc.  Non-urgent messages can be sent to your provider as well.   To learn more about what you can do with MyChart, go to [email or web address].    Your next appointment:   2 month(s)  Provider:   Robin Searing, NP       Other Instructions

## 2023-07-14 ENCOUNTER — Other Ambulatory Visit: Payer: Self-pay | Admitting: Physician Assistant

## 2023-07-14 DIAGNOSIS — J849 Interstitial pulmonary disease, unspecified: Secondary | ICD-10-CM

## 2023-07-15 ENCOUNTER — Telehealth: Payer: Self-pay | Admitting: Cardiology

## 2023-07-15 NOTE — Telephone Encounter (Signed)
Patient informed of results.  

## 2023-07-15 NOTE — Telephone Encounter (Signed)
 Patient returned staff call regarding results.

## 2023-07-28 ENCOUNTER — Telehealth: Payer: Self-pay

## 2023-07-28 ENCOUNTER — Ambulatory Visit: Payer: BC Managed Care – PPO

## 2023-07-28 DIAGNOSIS — Z7901 Long term (current) use of anticoagulants: Secondary | ICD-10-CM

## 2023-07-28 MED ORDER — WARFARIN SODIUM 5 MG PO TABS
ORAL_TABLET | ORAL | 1 refills | Status: DC
Start: 2023-07-28 — End: 2023-12-08

## 2023-07-28 NOTE — Telephone Encounter (Signed)
Pt missed coumadin clinic apt this morning. Contacted pt and reports he just forgot. RS apt for two weeks due to no coumadin clinic nurse in the office next week. Advised if any changes to contact the coumadin clinic. Pt verbalized understanding. Pt is compliant with warfarin management and PCP apts.  Sent in refill of warfarin to requested pharmacy.

## 2023-08-05 ENCOUNTER — Telehealth: Payer: Self-pay | Admitting: Pulmonary Disease

## 2023-08-05 NOTE — Telephone Encounter (Signed)
FMLA paperwork will be faxed over. Once it is filled out he will make an appointment with Dr.Dewald. It'll be coming from a company called [employer].

## 2023-08-11 ENCOUNTER — Ambulatory Visit (INDEPENDENT_AMBULATORY_CARE_PROVIDER_SITE_OTHER): Payer: BC Managed Care – PPO

## 2023-08-11 DIAGNOSIS — Z7901 Long term (current) use of anticoagulants: Secondary | ICD-10-CM | POA: Diagnosis not present

## 2023-08-11 LAB — POCT INR: INR: 2.5 (ref 2.0–3.0)

## 2023-08-11 NOTE — Patient Instructions (Addendum)
Pre visit review using our clinic review tool, if applicable. No additional management support is needed unless otherwise documented below in the visit note.  Continue 2 tablets daily except take 3 tablets on Mondays. Recheck in 6 weeks.

## 2023-08-11 NOTE — Progress Notes (Signed)
Continue 2 tablets daily except take 3 tablets on Mondays. Recheck in 6 weeks.  

## 2023-08-11 NOTE — Progress Notes (Signed)
I have reviewed the patient's encounter and agree with the documentation.  Katina Degree. [name], MD 08/11/2023 9:08 AM

## 2023-08-18 ENCOUNTER — Other Ambulatory Visit: Payer: Self-pay | Admitting: Family Medicine

## 2023-09-01 ENCOUNTER — Ambulatory Visit: Payer: BC Managed Care – PPO | Attending: Nurse Practitioner

## 2023-09-06 ENCOUNTER — Encounter (HOSPITAL_BASED_OUTPATIENT_CLINIC_OR_DEPARTMENT_OTHER): Payer: Self-pay

## 2023-09-06 ENCOUNTER — Other Ambulatory Visit (HOSPITAL_BASED_OUTPATIENT_CLINIC_OR_DEPARTMENT_OTHER): Payer: Self-pay

## 2023-09-06 ENCOUNTER — Other Ambulatory Visit: Payer: Self-pay

## 2023-09-06 ENCOUNTER — Emergency Department (HOSPITAL_BASED_OUTPATIENT_CLINIC_OR_DEPARTMENT_OTHER): Payer: BC Managed Care – PPO

## 2023-09-06 ENCOUNTER — Emergency Department (HOSPITAL_BASED_OUTPATIENT_CLINIC_OR_DEPARTMENT_OTHER)
Admission: EM | Admit: 2023-09-06 | Discharge: 2023-09-06 | Disposition: A | Discharge status: Home / Self Care | Payer: BC Managed Care – PPO | Attending: Emergency Medicine | Admitting: Emergency Medicine

## 2023-09-06 DIAGNOSIS — Z20822 Contact with and (suspected) exposure to covid-19: Secondary | ICD-10-CM | POA: Diagnosis not present

## 2023-09-06 DIAGNOSIS — R600 Localized edema: Secondary | ICD-10-CM | POA: Insufficient documentation

## 2023-09-06 DIAGNOSIS — R0602 Shortness of breath: Secondary | ICD-10-CM | POA: Diagnosis not present

## 2023-09-06 DIAGNOSIS — R509 Fever, unspecified: Secondary | ICD-10-CM | POA: Diagnosis not present

## 2023-09-06 DIAGNOSIS — R197 Diarrhea, unspecified: Secondary | ICD-10-CM | POA: Diagnosis not present

## 2023-09-06 DIAGNOSIS — M791 Myalgia, unspecified site: Secondary | ICD-10-CM | POA: Diagnosis not present

## 2023-09-06 DIAGNOSIS — N179 Acute kidney failure, unspecified: Secondary | ICD-10-CM | POA: Insufficient documentation

## 2023-09-06 DIAGNOSIS — M7918 Myalgia, other site: Secondary | ICD-10-CM | POA: Diagnosis not present

## 2023-09-06 LAB — COMPREHENSIVE METABOLIC PANEL
ALT: 46 U/L — ABNORMAL HIGH (ref 0–44)
AST: 34 U/L (ref 15–41)
Albumin: 4.1 g/dL (ref 3.5–5.0)
Alkaline Phosphatase: 46 U/L (ref 38–126)
Anion gap: 10 (ref 5–15)
BUN: 20 mg/dL (ref 6–20)
CO2: 27 mmol/L (ref 22–32)
Calcium: 9.3 mg/dL (ref 8.9–10.3)
Chloride: 96 mmol/L — ABNORMAL LOW (ref 98–111)
Creatinine, Ser: 1.79 mg/dL — ABNORMAL HIGH (ref 0.61–1.24)
GFR, Estimated: 45 mL/min — ABNORMAL LOW (ref >60)
Glucose, Bld: 105 mg/dL — ABNORMAL HIGH (ref 70–99)
Potassium: 3 mmol/L — ABNORMAL LOW (ref 3.5–5.1)
Sodium: 133 mmol/L — ABNORMAL LOW (ref 135–145)
Total Bilirubin: 1.7 mg/dL — ABNORMAL HIGH (ref 0.3–1.2)
Total Protein: 7.4 g/dL (ref 6.5–8.1)

## 2023-09-06 LAB — CBC WITH DIFFERENTIAL/PLATELET
Abs Immature Granulocytes: 0.04 10*3/uL (ref 0.00–0.07)
Basophils Absolute: 0.1 10*3/uL (ref 0.0–0.1)
Basophils Relative: 1 %
Eosinophils Absolute: 0.1 10*3/uL (ref 0.0–0.5)
Eosinophils Relative: 1 %
HCT: 46 % (ref 39.0–52.0)
Hemoglobin: 16.1 g/dL (ref 13.0–17.0)
Immature Granulocytes: 0 %
Lymphocytes Relative: 15 %
Lymphs Abs: 1.4 10*3/uL (ref 0.7–4.0)
MCH: 30.4 pg (ref 26.0–34.0)
MCHC: 35 g/dL (ref 30.0–36.0)
MCV: 86.8 fL (ref 80.0–100.0)
Monocytes Absolute: 0.8 10*3/uL (ref 0.1–1.0)
Monocytes Relative: 9 %
Neutro Abs: 6.9 10*3/uL (ref 1.7–7.7)
Neutrophils Relative %: 74 %
Platelets: 180 10*3/uL (ref 150–400)
RBC: 5.3 MIL/uL (ref 4.22–5.81)
RDW: 12.9 % (ref 11.5–15.5)
WBC: 9.4 10*3/uL (ref 4.0–10.5)
nRBC: 0 % (ref 0.0–0.2)

## 2023-09-06 LAB — RESP PANEL BY RT-PCR (RSV, FLU A&B, COVID)  RVPGX2
Influenza A by PCR: NEGATIVE
Influenza B by PCR: NEGATIVE
Resp Syncytial Virus by PCR: NEGATIVE
SARS Coronavirus 2 by RT PCR: NEGATIVE

## 2023-09-06 LAB — LACTIC ACID, PLASMA: Lactic Acid, Venous: 1.3 mmol/L (ref 0.5–1.9)

## 2023-09-06 MED ORDER — POTASSIUM CHLORIDE CRYS ER 20 MEQ PO TBCR: 20.0000 meq | EXTENDED_RELEASE_TABLET | Freq: Two times a day (BID) | ORAL | 0 refills | Status: DC

## 2023-09-06 MED ORDER — LACTATED RINGERS IV BOLUS (SEPSIS)
1000.0000 mL | Freq: Once | INTRAVENOUS | Status: AC
  Administered 2023-09-06: 1000 mL via INTRAVENOUS

## 2023-09-06 MED ORDER — POTASSIUM CHLORIDE CRYS ER 20 MEQ PO TBCR
40.0000 meq | EXTENDED_RELEASE_TABLET | Freq: Once | ORAL | Status: AC
  Administered 2023-09-06: 40 meq via ORAL
  Filled 2023-09-06: qty 2

## 2023-09-06 NOTE — ED Provider Notes (Signed)
[place] EMERGENCY DEPARTMENT AT [facility] Provider Note   CSN: [id number] Arrival date & time: 09/06/23  1026     History  Chief Complaint  Patient presents with   Fever   Fatigue   Shortness of Breath    Wesley Hancock is a 52 y.o. male.  HPI Reports has had fever for the past 3 days.  Tmax at home has been 102.8.  He reports he is felt achy all over.  Patient attributed to getting his pneumonia vaccine and flu vaccine 3 days ago.  He reports he does have a red swollen area on his right shoulder from his immunization.  Patient reports he gets pneumonia about twice a year due to his underlying interstitial lung disease.  He denies he has had significant or productive cough.  He reports he is have small amount of diarrhea.  A lot of fatigue.  No urinary symptoms.    Home Medications Prior to Admission medications   Medication Sig Start Date End Date Taking? Authorizing Provider  potassium chloride SA (KLOR-CON M) 20 MEQ tablet Take 1 tablet (20 mEq total) by mouth 2 (two) times daily. 09/06/23  Yes Arby Barrette, MD  acetaminophen (TYLENOL) 500 MG tablet Take 1,000 mg by mouth every 6 (six) hours as needed for mild pain.    [provider]  albuterol (VENTOLIN HFA) 108 (90 Base) MCG/ACT inhaler Inhale 2 puffs into the lungs every 6 (six) hours as needed for wheezing or shortness of breath. 04/22/21   Martina Sinner, MD  buPROPion (WELLBUTRIN) 75 MG tablet TAKE 1 TABLET BY MOUTH TWICE A DAY 08/18/23   Ardith Dark, MD  escitalopram (LEXAPRO) 20 MG tablet TAKE 1 TABLET BY MOUTH EVERY DAY 07/09/23   Jarold Motto, PA  Evolocumab (REPATHA SURECLICK) 140 MG/ML SOAJ Inject 140 mg into the skin every 14 (fourteen) days. 10/01/22   Nahser, Deloris Ping, MD  fluticasone (FLONASE) 50 MCG/ACT nasal spray Place 1 spray into both nostrils daily as needed for allergies.    [provider]  hydrochlorothiazide (HYDRODIURIL) 25 MG tablet Take 1 tablet (25 mg  total) by mouth daily. 07/09/23   Gaston Islam., NP  Multiple Vitamin (MULTIVITAMIN) tablet Take by mouth daily. 2 chews daily    [provider]  mycophenolate (CELLCEPT) 500 MG tablet TAKE 2 TABLETS BY MOUTH TWICE DAILY 07/14/23   Jarold Motto, PA  nitroGLYCERIN (NITROSTAT) 0.4 MG SL tablet Place 1 tablet (0.4 mg total) under the tongue every 5 (five) minutes as needed for chest pain. 07/09/23 10/07/23  Gaston Islam., NP  omeprazole (PRILOSEC) 20 MG capsule Take 20 mg by mouth 2 (two) times daily before a meal.    [provider]  rosuvastatin (CRESTOR) 20 MG tablet Take 1 tablet (20 mg total) by mouth daily. 07/09/23 10/07/23  Gaston Islam., NP  telmisartan (MICARDIS) 80 MG tablet Take 1 tablet (80 mg total) by mouth daily. 09/30/22   Swinyer, Zachary George, NP  triamcinolone cream (KENALOG) 0.1 % Apply to affected area 1-2 times daily 06/11/22   Jarold Motto, PA  warfarin (COUMADIN) 5 MG tablet TAKE 2 TABLETS BY MOUTH DAILY EXCEPT TAKE 3 TABLETS ON MONDAYS OR AS DIRECTED BY ANTICOAGULATION CLINIC 07/28/23   Ardith Dark, MD      Allergies    Diltiazem hcl    Review of Systems   Review of Systems  Physical Exam Updated Vital Signs BP 117/70 (BP Location: Right Arm)  Pulse (!) 101   Temp 99.5 F (37.5 C) (Oral)   Resp 16   SpO2 95%  Physical Exam Constitutional:      Comments: Alert nontoxic.  No respiratory distress at rest.  HENT:     Nose: Nose normal.     Mouth/Throat:     Mouth: Mucous membranes are moist.     Pharynx: Oropharynx is clear.  Eyes:     Extraocular Movements: Extraocular movements intact.     Pupils: Pupils are equal, round, and reactive to light.  Cardiovascular:     Comments:  Tachycardia.  No gross rub murmur gallop. Pulmonary:     Comments: No respiratory distress at rest.  Breath sounds are soft with occasional crackle.  No rhonchi or significant wheeze. Abdominal:     General: There is no distension.     Palpations:  Abdomen is soft.     Tenderness: There is no abdominal tenderness. There is no guarding.  Musculoskeletal:        General: Normal range of motion.     Cervical back: Neck supple.     Comments: Patient has some chronic lower extremity edema 1+.  Calves nontender symmetric.  Skin:    General: Skin is warm and dry.     Comments: Patient has a erythematous patch on his right shoulder see attached image.  Neurological:     General: No focal deficit present.     Mental Status: He is oriented to person, place, and time.     Motor: No weakness.     Coordination: Coordination normal.  Psychiatric:        Mood and Affect: Mood normal.     ED Results / Procedures / Treatments   Labs (all labs ordered are listed, but only abnormal results are displayed) Labs Reviewed  COMPREHENSIVE METABOLIC PANEL - Abnormal; Notable for the following components:      Result Value   Sodium 133 (*)    Potassium 3.0 (*)    Chloride 96 (*)    Glucose, Bld 105 (*)    Creatinine, Ser 1.79 (*)    ALT 46 (*)    Total Bilirubin 1.7 (*)    GFR, Estimated 45 (*)    All other components within normal limits  RESP PANEL BY RT-PCR (RSV, FLU A&B, COVID)  RVPGX2  CULTURE, BLOOD (ROUTINE X 2)  CULTURE, BLOOD (ROUTINE X 2)  LACTIC ACID, PLASMA  CBC WITH DIFFERENTIAL/PLATELET  LACTIC ACID, PLASMA  URINALYSIS, W/ REFLEX TO CULTURE (INFECTION SUSPECTED)    EKG EKG Interpretation Date/Time:  Monday September 06 2023 12:54:54 EDT Ventricular Rate:  94 PR Interval:  168 QRS Duration:  100 QT Interval:  371 QTC Calculation: 464 R Axis:   30  Text Interpretation: Sinus rhythm no acute ischemic appearance, no sig change from previous Confirmed by Arby Barrette [phone]) on 09/06/2023 3:41:01 PM  Radiology DG Chest Port 1 View  Result Date: 09/06/2023 CLINICAL DATA:  Fever. Questionable sepsis - evaluate for abnormality EXAM: PORTABLE CHEST 1 VIEW COMPARISON:  06/21/2023. FINDINGS: Bilateral lung fields are clear.  Bilateral lateral costophrenic angles are clear. Normal cardio-mediastinal silhouette. No acute osseous abnormalities. The soft tissues are within normal limits. IMPRESSION: No active disease. Electronically Signed   By: Jules Schick M.D.   On: 09/06/2023 13:23    Procedures Procedures    Medications Ordered in ED Medications  lactated ringers bolus 1,000 mL (0 mLs Intravenous Stopped 09/06/23 1546)  potassium chloride SA (KLOR-CON M) CR tablet  40 mEq (40 mEq Oral Given 09/06/23 1307)    ED Course/ Medical Decision Making/ A&P                                 Medical Decision Making Amount and/or Complexity of Data Reviewed Labs: ordered. Radiology: ordered. ECG/medicine tests: ordered.  Risk Prescription drug management.   Patient presents as outlined.  He does have history of interstitial lung disease and reports recurrent episodes of pneumonia.  Has history of lupus and phospholipid syndrome.  Patient did have his pneumonia immunization within the past 3 days.  Symptoms may be secondary to immune response due to vaccination.  However with significant comorbid conditions will proceed with diagnostic evaluation for possible sepsis.  Patient's blood pressures are mildly hypotensive in the mid 90s systolic.  No fever in the emergency department but reports fever at home.  At this time no apparent source.  Patient has a little bit of an inflammatory response on his shoulder where he got his immunization but at this time I do not have high suspicion for cellulitis.  Lactic acid 1.3 COVID influenza testing negative potassium 3.0 BUN/creatinine 20 and 1.7 GFR 45 CBC normal with normal differential.  Chest x-ray reviewed by radiology no acute findings.  Patient was rehydrated with a liter of lactated Ringer's.  At this time with no leukocytosis, no elevation of the lactic acid, patient's blood pressure is now normotensive after liter fluids and no other specific focus of infection, I do  feel patient is stable for discharge and close home monitoring.  He does have mild AKI but is not vomiting or having significant amounts of diarrhea.  Patient does feel that he can hydrate orally.  Patient's potassium was mildly low, will supplement potassium for the next several days and patient will be aggressively hydrating with electrolyte solution.  Discussed getting a follow-up within about 2 days for recheck on lab work and return precautions should any worsening or changing symptoms develop.        Final Clinical Impression(s) / ED Diagnoses Final diagnoses:  Fever, unspecified fever cause  Myalgia    Rx / DC Orders ED Discharge Orders          Ordered    potassium chloride SA (KLOR-CON M) 20 MEQ tablet  2 times daily        09/06/23 1547              Arby Barrette, MD 09/06/23 1553

## 2023-09-06 NOTE — ED Triage Notes (Signed)
Pt presents to the er with reports of fever at home high as 102.3 today. Pt stated he received the pneumonia and flu vaccine Saturday. And reports having chills, nausea, and sob hours after dosing. Symptoms has continued into today

## 2023-09-06 NOTE — Discharge Instructions (Addendum)
1.  Follow-up with your doctor for recheck in the next 3 to 4 days. 2.  Continue to take Tylenol for fever and bodyaches and hydrate. 3.  Return to the emergency department if you have new or changing symptoms. 4.  At this time your symptoms may be due to to response to your recent immunizations.  Symptoms should be improving within the next 24 to 48 hours.  Return if they are worsening or other concerning symptoms develop.

## 2023-09-07 ENCOUNTER — Other Ambulatory Visit: Payer: Self-pay

## 2023-09-07 ENCOUNTER — Emergency Department (HOSPITAL_COMMUNITY): Payer: BC Managed Care – PPO

## 2023-09-07 ENCOUNTER — Encounter (HOSPITAL_COMMUNITY): Payer: Self-pay

## 2023-09-07 ENCOUNTER — Emergency Department (HOSPITAL_COMMUNITY)
Admission: EM | Admit: 2023-09-07 | Discharge: 2023-09-07 | Disposition: A | Discharge status: Home / Self Care | Payer: BC Managed Care – PPO | Attending: Emergency Medicine | Admitting: Emergency Medicine

## 2023-09-07 DIAGNOSIS — K5732 Diverticulitis of large intestine without perforation or abscess without bleeding: Secondary | ICD-10-CM | POA: Insufficient documentation

## 2023-09-07 DIAGNOSIS — Z7901 Long term (current) use of anticoagulants: Secondary | ICD-10-CM | POA: Diagnosis not present

## 2023-09-07 DIAGNOSIS — K5792 Diverticulitis of intestine, part unspecified, without perforation or abscess without bleeding: Secondary | ICD-10-CM

## 2023-09-07 DIAGNOSIS — E876 Hypokalemia: Secondary | ICD-10-CM | POA: Diagnosis not present

## 2023-09-07 DIAGNOSIS — R162 Hepatomegaly with splenomegaly, not elsewhere classified: Secondary | ICD-10-CM | POA: Diagnosis not present

## 2023-09-07 DIAGNOSIS — K429 Umbilical hernia without obstruction or gangrene: Secondary | ICD-10-CM | POA: Diagnosis not present

## 2023-09-07 DIAGNOSIS — R109 Unspecified abdominal pain: Secondary | ICD-10-CM | POA: Diagnosis present

## 2023-09-07 DIAGNOSIS — R932 Abnormal findings on diagnostic imaging of liver and biliary tract: Secondary | ICD-10-CM | POA: Diagnosis not present

## 2023-09-07 LAB — CBC WITH DIFFERENTIAL/PLATELET
Abs Immature Granulocytes: 0.03 10*3/uL (ref 0.00–0.07)
Basophils Absolute: 0 10*3/uL (ref 0.0–0.1)
Basophils Relative: 1 %
Eosinophils Absolute: 0.1 10*3/uL (ref 0.0–0.5)
Eosinophils Relative: 2 %
HCT: 44.6 % (ref 39.0–52.0)
Hemoglobin: 15.2 g/dL (ref 13.0–17.0)
Immature Granulocytes: 1 %
Lymphocytes Relative: 18 %
Lymphs Abs: 1.2 10*3/uL (ref 0.7–4.0)
MCH: 30.3 pg (ref 26.0–34.0)
MCHC: 34.1 g/dL (ref 30.0–36.0)
MCV: 88.8 fL (ref 80.0–100.0)
Monocytes Absolute: 0.6 10*3/uL (ref 0.1–1.0)
Monocytes Relative: 8 %
Neutro Abs: 4.7 10*3/uL (ref 1.7–7.7)
Neutrophils Relative %: 70 %
Platelets: 160 10*3/uL (ref 150–400)
RBC: 5.02 MIL/uL (ref 4.22–5.81)
RDW: 13 % (ref 11.5–15.5)
WBC: 6.7 10*3/uL (ref 4.0–10.5)
nRBC: 0 % (ref 0.0–0.2)

## 2023-09-07 LAB — COMPREHENSIVE METABOLIC PANEL
ALT: 48 U/L — ABNORMAL HIGH (ref 0–44)
AST: 41 U/L (ref 15–41)
Albumin: 3.4 g/dL — ABNORMAL LOW (ref 3.5–5.0)
Alkaline Phosphatase: 44 U/L (ref 38–126)
Anion gap: 8 (ref 5–15)
BUN: 22 mg/dL — ABNORMAL HIGH (ref 6–20)
CO2: 26 mmol/L (ref 22–32)
Calcium: 8 mg/dL — ABNORMAL LOW (ref 8.9–10.3)
Chloride: 97 mmol/L — ABNORMAL LOW (ref 98–111)
Creatinine, Ser: 1.52 mg/dL — ABNORMAL HIGH (ref 0.61–1.24)
GFR, Estimated: 55 mL/min — ABNORMAL LOW (ref >60)
Glucose, Bld: 107 mg/dL — ABNORMAL HIGH (ref 70–99)
Potassium: 3 mmol/L — ABNORMAL LOW (ref 3.5–5.1)
Sodium: 131 mmol/L — ABNORMAL LOW (ref 135–145)
Total Bilirubin: 1.2 mg/dL (ref 0.3–1.2)
Total Protein: 7.5 g/dL (ref 6.5–8.1)

## 2023-09-07 LAB — URINALYSIS, ROUTINE W REFLEX MICROSCOPIC
Bilirubin Urine: NEGATIVE
Glucose, UA: NEGATIVE mg/dL
Hgb urine dipstick: NEGATIVE
Ketones, ur: NEGATIVE mg/dL
Leukocytes,Ua: NEGATIVE
Nitrite: NEGATIVE
Protein, ur: NEGATIVE mg/dL
Specific Gravity, Urine: 1.011 (ref 1.005–1.030)
pH: 6 (ref 5.0–8.0)

## 2023-09-07 MED ORDER — SODIUM CHLORIDE 0.9 % IV BOLUS
1000.0000 mL | Freq: Once | INTRAVENOUS | Status: AC
  Administered 2023-09-07: 1000 mL via INTRAVENOUS

## 2023-09-07 MED ORDER — AMOXICILLIN-POT CLAVULANATE 875-125 MG PO TABS
1.0000 | ORAL_TABLET | Freq: Once | ORAL | Status: AC
  Administered 2023-09-07: 1 via ORAL
  Filled 2023-09-07: qty 1

## 2023-09-07 MED ORDER — ACETAMINOPHEN 325 MG PO TABS
650.0000 mg | ORAL_TABLET | Freq: Once | ORAL | Status: AC
  Administered 2023-09-07: 650 mg via ORAL
  Filled 2023-09-07: qty 2

## 2023-09-07 MED ORDER — IOHEXOL 300 MG/ML  SOLN
100.0000 mL | Freq: Once | INTRAMUSCULAR | Status: AC | PRN
  Administered 2023-09-07: 100 mL via INTRAVENOUS

## 2023-09-07 MED ORDER — AMOXICILLIN-POT CLAVULANATE 875-125 MG PO TABS
1.0000 | ORAL_TABLET | Freq: Two times a day (BID) | ORAL | 0 refills | Status: AC
Start: 2023-09-08 — End: 2023-09-15

## 2023-09-07 MED ORDER — POTASSIUM CHLORIDE 10 MEQ/100ML IV SOLN
10.0000 meq | Freq: Once | INTRAVENOUS | Status: AC
  Administered 2023-09-07: 10 meq via INTRAVENOUS
  Filled 2023-09-07: qty 100

## 2023-09-07 MED ORDER — ONDANSETRON 4 MG PO TBDP
4.0000 mg | ORAL_TABLET | Freq: Once | ORAL | Status: AC
  Administered 2023-09-07: 4 mg via ORAL
  Filled 2023-09-07: qty 1

## 2023-09-07 NOTE — ED Provider Notes (Signed)
[place] EMERGENCY DEPARTMENT AT [facility] Provider Note   CSN: [id number] Arrival date & time: 09/07/23  1155     History  Chief Complaint  Patient presents with   Flank Pain    Wesley Hancock is a 52 y.o. male with a history of lupus on mycophenolate (not on prednisone), antiphospholipid syndrome on Coumadin, presented to the ED with complaint of fevers, chills, flank pain and diarrhea.  Patient was in ED yesterday for similar symptoms.  He reports that 3 days ago he received an injection in his right arm for a flu vaccine as well as pneumonia vaccine.  He has never had the pneumonia vaccine before.  He said symptoms began later that night with fevers and muscle aches and fatigue, nausea and vomiting.  He was concerned about decreased urine output.  He went to [facility] yesterday where he had an infectious workup including white blood cell count that was normal at 9000, blood cultures drawn which were no growth to date, and a chest x-ray that was unremarkable, as well as a negative COVID test and a negative UA.  He was advised to return if his symptoms or not improving, and he reports in the past 24 hours and symptoms not improved.  He is now concerned that he has a persistent pain in his left lower back.  He also reports he has had loose bowel movements and diarrhea for 24 hours.  He has persistent coughing, headache.  He does report intermittent lightheadedness.  He reports compliance with all of his other medications and therapeutic INR levels.  Reports abdominal surgical history that includes cholecystectomy.  He denies history of spinal surgery or IV drug use.  HPI     Home Medications Prior to Admission medications   Medication Sig Start Date End Date Taking? Authorizing Provider  amoxicillin-clavulanate (AUGMENTIN) 875-125 MG tablet Take 1 tablet by mouth 2 (two) times daily for 7 days. 09/08/23 09/15/23 Yes Alyaan Budzynski, Kermit Balo, MD  acetaminophen (TYLENOL) 500  MG tablet Take 1,000 mg by mouth every 6 (six) hours as needed for mild pain.    [provider]  albuterol (VENTOLIN HFA) 108 (90 Base) MCG/ACT inhaler Inhale 2 puffs into the lungs every 6 (six) hours as needed for wheezing or shortness of breath. 04/22/21   Martina Sinner, MD  buPROPion (WELLBUTRIN) 75 MG tablet TAKE 1 TABLET BY MOUTH TWICE A DAY 08/18/23   Ardith Dark, MD  escitalopram (LEXAPRO) 20 MG tablet TAKE 1 TABLET BY MOUTH EVERY DAY 07/09/23   Jarold Motto, PA  Evolocumab (REPATHA SURECLICK) 140 MG/ML SOAJ Inject 140 mg into the skin every 14 (fourteen) days. 10/01/22   Nahser, Deloris Ping, MD  fluticasone (FLONASE) 50 MCG/ACT nasal spray Place 1 spray into both nostrils daily as needed for allergies.    [provider]  hydrochlorothiazide (HYDRODIURIL) 25 MG tablet Take 1 tablet (25 mg total) by mouth daily. 07/09/23   Gaston Islam., NP  Multiple Vitamin (MULTIVITAMIN) tablet Take by mouth daily. 2 chews daily    [provider]  mycophenolate (CELLCEPT) 500 MG tablet TAKE 2 TABLETS BY MOUTH TWICE DAILY 07/14/23   Jarold Motto, PA  nitroGLYCERIN (NITROSTAT) 0.4 MG SL tablet Place 1 tablet (0.4 mg total) under the tongue every 5 (five) minutes as needed for chest pain. 07/09/23 10/07/23  Gaston Islam., NP  omeprazole (PRILOSEC) 20 MG capsule Take 20 mg by mouth 2 (two) times daily before a meal.  [provider]  potassium chloride SA (KLOR-CON M) 20 MEQ tablet Take 1 tablet (20 mEq total) by mouth 2 (two) times daily. 09/06/23   Arby Barrette, MD  rosuvastatin (CRESTOR) 20 MG tablet Take 1 tablet (20 mg total) by mouth daily. 07/09/23 10/07/23  Gaston Islam., NP  telmisartan (MICARDIS) 80 MG tablet Take 1 tablet (80 mg total) by mouth daily. 09/30/22   Swinyer, Zachary George, NP  triamcinolone cream (KENALOG) 0.1 % Apply to affected area 1-2 times daily 06/11/22   Jarold Motto, PA  warfarin (COUMADIN) 5 MG tablet TAKE 2 TABLETS BY MOUTH  DAILY EXCEPT TAKE 3 TABLETS ON MONDAYS OR AS DIRECTED BY ANTICOAGULATION CLINIC 07/28/23   Ardith Dark, MD      Allergies    Diltiazem hcl    Review of Systems   Review of Systems  Physical Exam Updated Vital Signs BP 113/73   Pulse 88   Temp (!) 101.1 F (38.4 C) (Oral)   Resp 18   Ht 6' 1.5" (1.867 m)   Wt (!) 142.9 kg   SpO2 100%   BMI 41.00 kg/m  Physical Exam Constitutional:      General: He is not in acute distress. HENT:     Head: Normocephalic and atraumatic.  Eyes:     Conjunctiva/sclera: Conjunctivae normal.     Pupils: Pupils are equal, round, and reactive to light.  Cardiovascular:     Rate and Rhythm: Normal rate and regular rhythm.  Pulmonary:     Effort: Pulmonary effort is normal. No respiratory distress.  Abdominal:     General: There is no distension.     Tenderness: There is no abdominal tenderness. There is left CVA tenderness. There is no right CVA tenderness or guarding.  Skin:    General: Skin is warm and dry.  Neurological:     General: No focal deficit present.     Mental Status: He is alert and oriented to person, place, and time. Mental status is at baseline.  Psychiatric:        Mood and Affect: Mood normal.        Behavior: Behavior normal.     ED Results / Procedures / Treatments   Labs (all labs ordered are listed, but only abnormal results are displayed) Labs Reviewed  COMPREHENSIVE METABOLIC PANEL - Abnormal; Notable for the following components:      Result Value   Sodium 131 (*)    Potassium 3.0 (*)    Chloride 97 (*)    Glucose, Bld 107 (*)    BUN 22 (*)    Creatinine, Ser 1.52 (*)    Calcium 8.0 (*)    Albumin 3.4 (*)    ALT 48 (*)    GFR, Estimated 55 (*)    All other components within normal limits  CBC WITH DIFFERENTIAL/PLATELET  URINALYSIS, ROUTINE W REFLEX MICROSCOPIC    EKG EKG Interpretation Date/Time:  Tuesday September 07 2023 12:41:35 EDT Ventricular Rate:  97 PR Interval:  170 QRS  Duration:  99 QT Interval:  360 QTC Calculation: 458 R Axis:   4  Text Interpretation: Sinus rhythm Low voltage, precordial leads Confirmed by Alvester Chou [phone]) on 09/07/2023 4:29:50 PM  Radiology CT ABDOMEN PELVIS W CONTRAST  Result Date: 09/07/2023 CLINICAL DATA:  Diverticulitis, complication suspected Left flank pain eval for diverticulitis - persistent fever diarrhea x 4 days EXAM: CT ABDOMEN AND PELVIS WITH CONTRAST TECHNIQUE: Multidetector CT imaging of the abdomen and pelvis was  performed using the standard protocol following bolus administration of intravenous contrast. RADIATION DOSE REDUCTION: This exam was performed according to the departmental dose-optimization program which includes automated exposure control, adjustment of the mA and/or kV according to patient size and/or use of iterative reconstruction technique. CONTRAST:  OMNIPAQUE IOHEXOL 300 MG/ML  SOLN COMPARISON:  09/06/2022 FINDINGS: Lower chest: Patchy and geographic ground-glass opacities in both lung bases. This is more diffuse than on prior exam. Hepatobiliary: Diffuse hepatic steatosis the liver is enlarged spanning 21.2 cm cranial caudal. No evidence of focal liver abnormality. Question of early subtle capsular nodularity of the left hepatic lobe, series 2, image 26. Clips in the gallbladder fossa postcholecystectomy. No biliary dilatation. Pancreas: Unremarkable. No pancreatic ductal dilatation or surrounding inflammatory changes. Spleen: Enlarged, 14.7 cm cranial caudal. Adrenals/Urinary Tract: Normal adrenal glands. No hydronephrosis or renal calculi. Subcentimeter hypodensities in the right kidney are too small to characterize. No further follow-up imaging is recommended. No renal calculi. Decompressed urinary bladder. Stomach/Bowel: Inflamed diverticulum in the proximal sigmoid, series 2, image 84, typical of diverticulitis. Additional noninflamed diverticula primarily in the left colon. There is no perforation  or abscess. Submucosal fatty infiltration involving the distal sigmoid colon and rectum suggestive of chronic or prior inflammation. Small volume of stool in the colon. The appendix is normal. No small bowel obstruction or inflammatory change. Unremarkable stomach. Vascular/Lymphatic: Aortic atherosclerosis. No aneurysm. Patent portal vein. Increased number of nonenlarged retroperitoneal lymph nodes, nonspecific. No enlarged lymph nodes by size criteria. Reproductive: Prostate is unremarkable. Other: No ascites. Small fat containing umbilical hernia. No free air or focal fluid collection. Musculoskeletal: Mild chronic T10 superior endplate compression deformity. Stable anterior spurring at the thoracolumbar junction. No acute osseous findings. IMPRESSION: 1. Acute uncomplicated diverticulitis of the proximal sigmoid colon. No perforation or abscess. 2. Hepatomegaly and hepatic steatosis. Question of early subtle capsular nodularity of the left hepatic lobe, can be seen with cirrhosis. Recommend correlation with cirrhosis risk factors. 3. Splenomegaly. 4. Patchy and geographic ground-glass opacities in both lung bases, more diffuse than on prior exam. This may be infectious or inflammatory. Findings are more diffuse than on exams from last year. Recommend follow-up high-resolution chest CT in 3-6 months for further assessment. 5. Small fat containing umbilical hernia. Aortic Atherosclerosis (ICD10-I70.0). Electronically Signed   By: Narda Rutherford M.D.   On: 09/07/2023 18:32   DG Chest Port 1 View  Result Date: 09/06/2023 CLINICAL DATA:  Fever. Questionable sepsis - evaluate for abnormality EXAM: PORTABLE CHEST 1 VIEW COMPARISON:  06/21/2023. FINDINGS: Bilateral lung fields are clear. Bilateral lateral costophrenic angles are clear. Normal cardio-mediastinal silhouette. No acute osseous abnormalities. The soft tissues are within normal limits. IMPRESSION: No active disease. Electronically Signed   By: Jules Schick M.D.   On: 09/06/2023 13:23    Procedures Procedures    Medications Ordered in ED Medications  ondansetron (ZOFRAN-ODT) disintegrating tablet 4 mg (4 mg Oral Given 09/07/23 1238)  sodium chloride 0.9 % bolus 1,000 mL (0 mLs Intravenous Stopped 09/07/23 1849)  potassium chloride 10 mEq in 100 mL IVPB (0 mEq Intravenous Stopped 09/07/23 1850)  iohexol (OMNIPAQUE) 300 MG/ML solution 100 mL (100 mLs Intravenous Contrast Given 09/07/23 1728)  acetaminophen (TYLENOL) tablet 650 mg (650 mg Oral Given 09/07/23 1801)  amoxicillin-clavulanate (AUGMENTIN) 875-125 MG per tablet 1 tablet (1 tablet Oral Given 09/07/23 1849)    ED Course/ Medical Decision Making/ A&P Clinical Course as of 09/07/23 1851  Tue Sep 07, 2023  1842 Discussed diagnosis with  patient.  We will treat him with Augmentin to cover empirically for both diverticulitis and potential pneumonia (with his interstitial lung disease is difficult to discern whether this is truly an infection).  The patient is content with this plan and in agreement.  He has potassium supplements already prescribed awaiting at the pharmacy from his visit yesterday that he has not picked up yet. [MT]    Clinical Course User Index [MT] Sadonna Kotara, Kermit Balo, MD                                 Medical Decision Making Amount and/or Complexity of Data Reviewed Radiology: ordered.  Risk OTC drugs. Prescription drug management.   This patient presents to the ED with concern for persistent subjective fevers and chills. This involves an extensive number of treatment options, and is a complaint that carries with it a high risk of complications and morbidity.  The differential diagnosis includes vaccine reaction versus lupus flareup versus autoimmune response versus intra-abdominal infection versus UTI versus other  I have a lower suspicion for sepsis given that the patient has a negative blood cultures from yesterday and an improving normalized white blood cell  count.  I am aware that he may have immunosuppression as he is on mycophenolate for his lupus, and therefore I have a lower threshold for performing a further infectious workup.  I think it is reasonable to consider CT of the abdomen at this time given his localized left flank pain and his loose stools, evaluate for potential diverticulitis or colitis.  He continues to have some mild erythema near the injection site in his right arm but I do not suspect cellulitis, DVT, abscess.  Co-morbidities that complicate the patient evaluation: History of lupus at high risk of autoimmune response in the setting of vaccine  External records from outside source obtained and reviewed including ED workup yesterday  I ordered and personally interpreted labs.  The pertinent results include: Potassium 3.0.  Creatinine improved from yesterday, now 1.5.  Sodium 131.  White blood cell count within normal limits.  UA without evidence of infection  I ordered imaging studies including CT abdomen pelvis I independently visualized and interpreted imaging which showed acute uncomplicated diverticulitis, chronic hepatic steatosis, questionable ILD versus atelectasis versus pneumonia of the lung base I agree with the radiologist interpretation   Per my interpretation the patient's ECG shows no ischemic findings  I ordered medication including IV fluid and potassium for hypokalemia and hydration for contrast study  I have reviewed the patients home medicines and have made adjustments as needed  After the interventions noted above, I reevaluated the patient and found that they have: stayed the same   Dispostion:  After consideration of the diagnostic results and the patients response to treatment, I feel that the patent would benefit from outpatient follow-up.         Final Clinical Impression(s) / ED Diagnoses Final diagnoses:  Hypokalemia  Diverticulitis    Rx / DC Orders ED Discharge Orders           Ordered    amoxicillin-clavulanate (AUGMENTIN) 875-125 MG tablet  2 times daily        09/07/23 1849              Terald Sleeper, MD 09/07/23 1851

## 2023-09-07 NOTE — Discharge Instructions (Addendum)
Your CT scan showed that you have signs of diverticulitis which is inflammation of your bowels.  There was question about a possible inflammation or infection around your lung, but it is difficult to determine whether this is truly pneumonia, related to your chronic lung disease.  In either case, I thought it was safest to prescribe an antibiotic that covers for both possible infections.  This is Augmentin.  This antibiotic is strongly can cause stomach upset, loose stools and diarrhea.  But I expect this should resolve your fevers and infection symptoms.  Please make sure to pick up the potassium supplements as prescribed yesterday and continue taking these as well for your low potassium level.  Read over the attached instructions regarding diverticulitis and low potassium.

## 2023-09-07 NOTE — ED Triage Notes (Signed)
C/o bilateral flank pain that started this AM and decreased urination. N/d, no vomiting. Pt reports he was seen at [facility] yesterday and they did blood cultures and a workup. Pt was told if he got worse to return to ER.

## 2023-09-07 NOTE — ED Provider Triage Note (Signed)
Emergency Medicine Provider Triage Evaluation Note  Wesley Hancock , a 52 y.o. male  was evaluated in triage.  Pt complains of decreased urine output, low back pain, diarrhea.  Patient was seen at [facility] ED yesterday for fever x 3 days.  Endorses near syncope while showering.  Has low grade fevers, has been taking Tylenol.  He has been drinking Pedialyte.    Review of Systems  Positive: As above Negative: As above  Physical Exam  BP 117/83   Pulse 92   Temp 99.2 F (37.3 C) (Oral)   Resp 18   Ht 6' 1.5" (1.867 m)   Wt (!) 142.9 kg   SpO2 98%   BMI 41.00 kg/m  Gen:   Awake, no distress   Resp:  Normal effort  MSK:   Moves extremities without difficulty  Other:    Medical Decision Making  Medically screening exam initiated at 12:26 PM.  Appropriate orders placed.  Wesley Hancock was informed that the remainder of the evaluation will be completed by another provider, this initial triage assessment does not replace that evaluation, and the importance of remaining in the ED until their evaluation is complete.     Melton Alar R, PA-C 09/07/23 1228

## 2023-09-08 ENCOUNTER — Telehealth: Payer: Self-pay

## 2023-09-08 ENCOUNTER — Ambulatory Visit (INDEPENDENT_AMBULATORY_CARE_PROVIDER_SITE_OTHER): Payer: BC Managed Care – PPO

## 2023-09-08 DIAGNOSIS — Z7901 Long term (current) use of anticoagulants: Secondary | ICD-10-CM | POA: Diagnosis not present

## 2023-09-08 LAB — POCT INR: INR: 1.8 — AB (ref 2.0–3.0)

## 2023-09-08 NOTE — Patient Instructions (Addendum)
Pre visit review using our clinic review tool, if applicable. No additional management support is needed unless otherwise documented below in the visit note.  Increase dose today to take 3 tablets and increase dose tomorrow to take 3 tablets and then continue 2 tablets daily except take 3 tablets on Mondays. Recheck in 2 weeks.

## 2023-09-08 NOTE — Telephone Encounter (Signed)
Pt reports 2 ER visits on 10/7 and 10/8. Pt c/o fever, diarrhea, N/V, abdominal pain. Pt was prescribed Augmentin, no interaction with warfarin. But diarrhea can cause elevation in INR. Pt report diarrhea for the last 2 weeks. Advised he should have INR checked. He will come in this morning. Placed pt on coumadin clinic schedule.

## 2023-09-08 NOTE — Progress Notes (Signed)
I have reviewed the patient's encounter and agree with the documentation.  Katina Degree. [name], MD 09/08/2023 10:27 AM

## 2023-09-08 NOTE — Progress Notes (Signed)
Pt reports 2 ER visits on 10/7 and 10/8. Pt c/o fever, diarrhea, N/V, abdominal pain. Pt was prescribed Augmentin, no interaction with warfarin. But diarrhea can cause elevation in INR. Pt report diarrhea for the last 2 weeks.  Increase dose today to take 3 tablets and increase dose tomorrow to take 3 tablets and then continue 2 tablets daily except take 3 tablets on Mondays. Recheck in 2 weeks.

## 2023-09-11 LAB — CULTURE, BLOOD (ROUTINE X 2)
Culture: NO GROWTH
Culture: NO GROWTH
Special Requests: ADEQUATE
Special Requests: ADEQUATE

## 2023-09-14 ENCOUNTER — Ambulatory Visit: Payer: BC Managed Care – PPO | Admitting: Family Medicine

## 2023-09-14 ENCOUNTER — Encounter: Payer: Self-pay | Admitting: Family Medicine

## 2023-09-14 VITALS — BP 115/78 | HR 91 | Temp 97.5°F | Ht 73.5 in | Wt 302.4 lb

## 2023-09-14 DIAGNOSIS — K5792 Diverticulitis of intestine, part unspecified, without perforation or abscess without bleeding: Secondary | ICD-10-CM

## 2023-09-14 DIAGNOSIS — R7303 Prediabetes: Secondary | ICD-10-CM | POA: Diagnosis not present

## 2023-09-14 DIAGNOSIS — K5790 Diverticulosis of intestine, part unspecified, without perforation or abscess without bleeding: Secondary | ICD-10-CM

## 2023-09-14 DIAGNOSIS — N179 Acute kidney failure, unspecified: Secondary | ICD-10-CM | POA: Diagnosis not present

## 2023-09-14 LAB — COMPREHENSIVE METABOLIC PANEL
ALT: 52 U/L (ref 0–53)
AST: 39 U/L — ABNORMAL HIGH (ref 0–37)
Albumin: 3.7 g/dL (ref 3.5–5.2)
Alkaline Phosphatase: 49 U/L (ref 39–117)
BUN: 17 mg/dL (ref 6–23)
CO2: 25 meq/L (ref 19–32)
Calcium: 9.8 mg/dL (ref 8.4–10.5)
Chloride: 100 meq/L (ref 96–112)
Creatinine, Ser: 1.08 mg/dL (ref 0.40–1.50)
GFR: 79.49 mL/min (ref >60.00)
Glucose, Bld: 97 mg/dL (ref 70–99)
Potassium: 4.5 meq/L (ref 3.5–5.1)
Sodium: 134 meq/L — ABNORMAL LOW (ref 135–145)
Total Bilirubin: 0.7 mg/dL (ref 0.2–1.2)
Total Protein: 8.1 g/dL (ref 6.0–8.3)

## 2023-09-14 LAB — CBC
HCT: 44.1 % (ref 39.0–52.0)
Hemoglobin: 14.5 g/dL (ref 13.0–17.0)
MCHC: 32.9 g/dL (ref 30.0–36.0)
MCV: 90.2 fL (ref 78.0–100.0)
Platelets: 302 10*3/uL (ref 150.0–400.0)
RBC: 4.89 Mil/uL (ref 4.22–5.81)
RDW: 14 % (ref 11.5–15.5)
WBC: 7 10*3/uL (ref 4.0–10.5)

## 2023-09-14 LAB — HEMOGLOBIN A1C: Hgb A1c MFr Bld: 6 % (ref 4.6–6.5)

## 2023-09-14 NOTE — Progress Notes (Signed)
Wesley Hancock is a 52 y.o. male who presents today for an office visit.  Assessment/Plan:  New/Acute Problems: Diverticulitis  He is recovering from recent flare.  Patient is not sure when his last colonoscopy was performed however is not available in the chart.  We will place referral to GI for further evaluation and likely repeat colonoscopy.  AKI Improved on recent labs at follow-up to ED visit however still not back to baseline.  Also had some hypokalemia and hyponatremia as well.  Will recheck metabolic panel today.  Chronic Problems Addressed Today: Diverticulosis Patient has had 2 flareups of diverticulitis within the last year.  Will be placing referral to GI as above.  Morbid obesity ([name]) He is down about 14 pounds since our last visit.  Some this may be due to his recent illness however he did recently start tirzepatide via a weight loss clinic.  His insurance would not pay for Mounjaro and Zepbound and thus he is paying for the tirzepatide out-of-pocket.  He feels like he is doing well with this.  He is doing well with this.  Prediabetes Recheck A1c today.  As above he recently started tirzepatide.  He would benefit from GLP-1 agonist due to his comorbidities however insurance will not pay for this at this point thus him going through weight loss clinic and paying out-of-pocket.  If he does have any A1c or glucose in the diabetic range would consider starting Mounjaro.     Subjective:  HPI:  See A/P for status of chronic conditions.  Patient is here today for ED follow-up.  He initially went to the ED 8 days ago with fever and rash on arm.  Workup in ED including labs were normal.  It was thought that his symptoms were potentially related to inflammatory response to recent pneumonia vaccine.  He was discharged home.  The next day, he came back to the ED due to persistent symptoms.  Had further work up at that time including additional labs and imaging. At that time had  CT abdomen pelvis which showed acute uncomplicated diverticulitis.  Also found to be hypokalemic.  He was started on Augmentin and discharged home.  His symptoms have improved significantly over the last week or so.  No recurrent fever.  Abdominal pain is improving.  Diarrhea has improved though has had a few mild episodes in the last day or so which may be due to the Augmentin.  He feels like he is back to near his baseline.  He would like to have labs rechecked today.       Objective:  Physical Exam: BP 115/78   Pulse 91   Temp (!) 97.5 F (36.4 C) (Temporal)   Ht 6' 1.5" (1.867 m)   Wt (!) 302 lb 6.4 oz (137.2 kg)   SpO2 95%   BMI 39.36 kg/m   Wt Readings from Last 3 Encounters:  09/14/23 (!) 302 lb 6.4 oz (137.2 kg)  09/07/23 (!) 315 lb (142.9 kg)  07/09/23 (!) 312 lb (141.5 kg)  Gen: No acute distress, resting comfortably CV: Regular rate and rhythm with no murmurs appreciated Pulm: Normal work of breathing, clear to auscultation bilaterally with no crackles, wheezes, or rhonchi Abdomen: Soft, nontender, nondistended.  Bowel sounds present. Neuro: Grossly normal, moves all extremities Psych: Normal affect and thought content  Time Spent: 45 minutes of total time was spent on the date of the encounter performing the following actions: chart review prior to seeing the patient including recent  Emergency Department visits, obtaining history, performing a medically necessary exam, counseling on the treatment plan, placing orders, and documenting in our EHR.        Katina Degree. [name], MD 09/14/2023 9:22 AM

## 2023-09-14 NOTE — Progress Notes (Deleted)
Cardiology Office Note    Patient Name: Wesley Hancock Date of Encounter: 09/14/2023  Primary Care Provider:  Ardith Dark, MD Primary Cardiologist:  Norman Herrlich, MD Primary Electrophysiologist: None   Past Medical History    Past Medical History:  Diagnosis Date   Acute nonintractable headache 09/06/2019   Acute respiratory failure with hypoxia ([name]) 08/30/2019   AKI (acute kidney injury) ([name]) 08/30/2019   Antiphospholipid syndrome ([name]) 10/30/2019   Anxiety    APS (antiphospholipid syndrome) ([name])    Arthritis    Colon polyp    ? hyperplastic   Depression    Diarrhea 05/21/2020   DVT (deep venous thrombosis) ([name])    Dyspnea on exertion 09/08/2017   "Quit smoking" 2015 with onset of symptoms in 2016  Spirometry 09/08/2017  Flat f/v loop  - d/c acei 09/08/2017  - 01/04/2020   Walked RA x two laps =  approx 526ft @ fast pace - stopped due to end of study/ min sob with sats of 93 % at the end of the study. - PFT's  03/08/20   FEV1 3.84 (83 % ) ratio 0..90  p 3 % improvement from saba p ? 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 06/26/20,  03/08/20   Snoring 07/09/2020   Stress fracture    in back, seeing PT   Stroke ([name])    Upper airway cough syndrome 01/05/2020   Onset mid Jan 2021 while on otc PPI  - max rx for gerd 01/04/2020 >>>      Witnessed apneic spells 07/09/2020    History of Present Illness  Wesley Hancock  is a 52 year old male with a PMH of premature CAD, coronary artery calcifications, essential hypertension, PE (on chronic warfarin), protein S deficiency, HLD, TIA, OSA (on CPAP), lupus, CVA with acute infarct left frontal and insular region 02/2021 who presents today for 61-month follow-up.  Wesley Hancock was last seen on 07/2023 for follow-up visit of chest discomfort and PVCs.  During visit patient's heart rate was elevated and blood pressures were normal.  He wore an event monitor to evaluate that showed predominantly sinus rhythm with 2 episodes of SVT lasting 5 beats and nonsustained.  He was having affordability concerns with Repatha and was provided patient assistance.  He was also experiencing myalgias with atorvastatin and this was discontinued for Crestor.   During today's visit the patient reports*** .  Patient denies chest pain, palpitations, dyspnea, PND, orthopnea, nausea, vomiting, dizziness, syncope, edema, weight gain, or early satiety.  ***Notes: -Last ischemic evaluation: -Last echo: -Interim ED visits: Review of Systems  Please  see the history of present illness.    All other systems reviewed and are otherwise negative except as noted above.  Physical Exam    Wt Readings from Last 3 Encounters:  09/14/23 (!) 302 lb 6.4 oz (137.2 kg)  09/07/23 (!) 315 lb (142.9 kg)  07/09/23 (!) 312 lb (141.5 kg)   UE:AVWUJ were no vitals filed for this visit.,There is no height or weight on file to calculate BMI. GEN: Well nourished, well developed in no acute distress Neck: No JVD; No carotid bruits Pulmonary: Clear to auscultation without rales, wheezing or rhonchi  Cardiovascular: Normal  rate. Regular rhythm. Normal S1. Normal S2.   Murmurs: There is no murmur.  ABDOMEN: Soft, non-tender, non-distended EXTREMITIES:  No edema; No deformity   EKG/LABS/ Recent Cardiac Studies   ECG personally reviewed by me today - ***  Risk Assessment/Calculations:   {Does this patient have ATRIAL FIBRILLATION?:[id number]}      Lab Results  Component Value Date   WBC 6.7 09/07/2023   HGB 15.2 09/07/2023   HCT 44.6 09/07/2023   MCV 88.8 09/07/2023   PLT 160 09/07/2023   Lab Results  Component Value Date   CREATININE 1.52 (H) 09/07/2023   BUN 22 (H) 09/07/2023   NA 131 (L) 09/07/2023   K 3.0 (L) 09/07/2023   CL 97 (L) 09/07/2023   CO2 26 09/07/2023   Lab Results  Component Value Date   CHOL 261 (H) 05/19/2023   HDL 38.70 (L) 05/19/2023   LDLCALC 147 (H) 09/30/2022   LDLDIRECT 187.0 05/19/2023   TRIG 204.0 (H) 05/19/2023   CHOLHDL 7 05/19/2023    Lab Results  Component Value Date   HGBA1C 5.8 05/19/2023   Assessment & Plan    1.  Chest pain  2.Coronary artery calcifications:   3.Frequent PVCs:  -Patient wore a ZIO monitor for 7 days that showed sinus rhythm with no episodes of PVCs or sustained arrhythmias.  4.  Essential hypertension: -Patient's blood pressure today was***  5. History of CVA/chronic PE: -Patient reports no residual effects from CVA -Continue Coumadin per INR clinic       Disposition: Follow-up with Norman Herrlich, MD or APP in *** months {Are you ordering a CV Procedure (e.g. stress test, cath, DCCV, TEE, etc)?   Press F2        :[id number]}   Signed, Napoleon Form, Leodis Rains, NP 09/14/2023, 1:19 PM [facility]

## 2023-09-14 NOTE — Assessment & Plan Note (Addendum)
He is down about 14 pounds since our last visit.  Some this may be due to his recent illness however he did recently start tirzepatide via a weight loss clinic.  His insurance would not pay for Mounjaro and Zepbound and thus he is paying for the tirzepatide out-of-pocket.  He feels like he is doing well with this.  He is doing well with this.

## 2023-09-14 NOTE — Assessment & Plan Note (Signed)
Patient has had 2 flareups of diverticulitis within the last year.  Will be placing referral to GI as above.

## 2023-09-14 NOTE — Patient Instructions (Addendum)
It was very nice to see you today!  I am glad that you are feeling better.  We will check labs today.  We will refer you to see the gastroenterologist.  Let us know if your symptoms do not continue to improve.  Return if symptoms worsen or fail to improve.   Take care, Dr [name]  PLEASE NOTE:  If you had any lab tests, please let us know if you have not heard back within a few days. You may see your results on mychart before we have a chance to review them but we will give you a call once they are reviewed by [place].   If we ordered any referrals today, please let us know if you have not heard from their office within the next week.   If you had any urgent prescriptions sent in today, please check with the pharmacy within an hour of our visit to make sure the prescription was transmitted appropriately.   Please try these tips to maintain a healthy lifestyle:  Eat at least 3 REAL meals and 1-2 snacks per day.  Aim for no more than 5 hours between eating.  If you eat breakfast, please do so within one hour of getting up.   Each meal should contain half fruits/vegetables, one quarter protein, and one quarter carbs (no bigger than a computer mouse)  Cut down on sweet beverages. This includes juice, soda, and sweet tea.   Drink at least 1 glass of water with each meal and aim for at least 8 glasses per day  Exercise at least 150 minutes every week.

## 2023-09-14 NOTE — Assessment & Plan Note (Signed)
Recheck A1c today.  As above he recently started tirzepatide.  He would benefit from GLP-1 agonist due to his comorbidities however insurance will not pay for this at this point thus him going through weight loss clinic and paying out-of-pocket.  If he does have any A1c or glucose in the diabetic range would consider starting Mounjaro.

## 2023-09-15 ENCOUNTER — Ambulatory Visit: Payer: BC Managed Care – PPO | Admitting: Family Medicine

## 2023-09-15 ENCOUNTER — Ambulatory Visit: Payer: BC Managed Care – PPO | Attending: Nurse Practitioner | Admitting: Nurse Practitioner

## 2023-09-15 ENCOUNTER — Telehealth: Payer: Self-pay | Admitting: Family Medicine

## 2023-09-15 DIAGNOSIS — Z8673 Personal history of transient ischemic attack (TIA), and cerebral infarction without residual deficits: Secondary | ICD-10-CM

## 2023-09-15 DIAGNOSIS — I493 Ventricular premature depolarization: Secondary | ICD-10-CM

## 2023-09-15 DIAGNOSIS — I10 Essential (primary) hypertension: Secondary | ICD-10-CM

## 2023-09-15 DIAGNOSIS — E785 Hyperlipidemia, unspecified: Secondary | ICD-10-CM

## 2023-09-15 DIAGNOSIS — R002 Palpitations: Secondary | ICD-10-CM

## 2023-09-15 DIAGNOSIS — R079 Chest pain, unspecified: Secondary | ICD-10-CM

## 2023-09-15 DIAGNOSIS — I2510 Atherosclerotic heart disease of native coronary artery without angina pectoris: Secondary | ICD-10-CM

## 2023-09-15 NOTE — Telephone Encounter (Signed)
Please advise 

## 2023-09-15 NOTE — Telephone Encounter (Signed)
Patient states that last night every time he got comfortable in bed, his legs would start to cramp really bad. He states he is concerned about this. I offered pt to speak with triage but he declined for now. Please advise.

## 2023-09-15 NOTE — Progress Notes (Signed)
Office Visit Note  Patient: Wesley Hancock             Date of Birth: 1971-06-15           MRN: [id number]             PCP: Ardith Dark, MD Referring: Ardith Dark, MD Visit Date: 09/28/2023 Occupation: @GUAROCC @  Subjective:  Pain in multiple joints  History of Present Illness: Wesley Hancock is a 52 y.o. male with mixed connective tissue disease and ILD.  He returns today after his last visit in November 2022.  Patient states he lost insurance and could not come for follow-up visit.  He states that he had no interruption in the prescription of CellCept.  He has been taking CellCept 1 g twice a day.  He denies any increased shortness of breath.  He has an appointment coming up with Dr. Phylis Bougie per patient.  He states over the last few years he has been noticing increased pain and discomfort in his bilateral hands.  He has significant morning stiffness and difficulty making a fist in the morning.  He also continues to have discomfort in his knee joints in his shoulders.  He states his bilateral trochanteric bursa has been painful.  He has difficulty sleeping on the side.  He also has difficulty getting in and out of the truck when he has flares.  He complains of frequent pneumonia.  He has intermittent lower back pain due to degenerative disc disease.  He had physical therapy in the past.  He has been noticing some numbness in the left upper arm.  He continues to be on Coumadin by hematology.  He complains of Raynauds.  He is requesting FMLA for appointments in the days when he is having a flare.  He is requesting 2 days a month.    Activities of Daily Living:  Patient reports morning stiffness for all day. Patient Reports nocturnal pain.  Difficulty dressing/grooming: Reports Difficulty climbing stairs: Reports Difficulty getting out of chair: Denies Difficulty using hands for taps, buttons, cutlery, and/or writing: Reports  Review of Systems  Constitutional:  Negative for  fatigue.  HENT:  Negative for mouth sores and mouth dryness.   Eyes:  Negative for dryness.  Respiratory:  Positive for cough, shortness of breath and wheezing.   Cardiovascular:  Negative for chest pain and palpitations.  Gastrointestinal:  Positive for constipation and diarrhea. Negative for blood in stool.  Endocrine: Negative for increased urination.  Genitourinary:  Negative for involuntary urination.  Musculoskeletal:  Positive for joint pain, gait problem, joint pain, myalgias, morning stiffness and myalgias. Negative for joint swelling, muscle weakness and muscle tenderness.  Skin:  Positive for color change. Negative for rash and sensitivity to sunlight.  Allergic/Immunologic: Positive for susceptible to infections.  Neurological:  Positive for numbness and headaches. Negative for dizziness.  Hematological:  Negative for swollen glands.  Psychiatric/Behavioral:  Positive for sleep disturbance. Negative for depressed mood. The patient is not nervous/anxious.     PMFS History:  Patient Active Problem List   Diagnosis Date Noted   Diverticulosis 09/14/2023   SLE (systemic lupus erythematosus) ([name]) 05/19/2023   Coronary artery disease 05/19/2023   GERD (gastroesophageal reflux disease) 05/19/2023   Vitamin D deficiency 10/09/2021   ILD (interstitial lung disease) ([name]) 05/27/2021   CVA (cerebral vascular accident) ([name]) 03/20/2021   Prediabetes 01/16/2021   OSA on CPAP 10/11/2020   APS (antiphospholipid syndrome) ([name])    Morbid obesity ([name]) 09/10/2019  Depression 06/24/2017   Dyslipidemia 09/18/2015    Past Medical History:  Diagnosis Date   Acute nonintractable headache 09/06/2019   Acute respiratory failure with hypoxia ([name]) 08/30/2019   AKI (acute kidney injury) ([name]) 08/30/2019   Antiphospholipid syndrome ([name]) 10/30/2019   Anxiety    APS (antiphospholipid syndrome) ([name])    Arthritis    Colon polyp    ? hyperplastic   Depression    Diarrhea 05/21/2020    DVT (deep venous thrombosis) ([name])    Dyspnea on exertion 09/08/2017   "Quit smoking" 2015 with onset of symptoms in 2016  Spirometry 09/08/2017  Flat f/v loop  - d/c acei 09/08/2017  - 01/04/2020   Walked RA x two laps =  approx 551ft @ fast pace - stopped due to end of study/ min sob with sats of 93 % at the end of the study. - PFT's  03/08/20   FEV1 3.84 (83 % ) ratio 0..90  p 3 % improvement from saba p ? 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 06/26/20, 03/08/20   Snoring 07/09/2020   Stress fracture    in back, seeing PT   Stroke ([name])    Upper airway cough syndrome 01/05/2020   Onset mid Jan 2021 while on otc PPI  - max rx for gerd 01/04/2020 >>>      Witnessed apneic spells 07/09/2020    Family History  Problem Relation Age of Onset   Diabetes Mother    Hyperlipidemia Mother    Hypertension Mother    Thyroid disease Mother        uncertain type--had surg--no cancer   Asthma Mother        died from PNA   Heart disease Father 72   Hyperlipidemia Father    Hypertension Father    Prostate cancer Father 33   Lung cancer Father 78       Dx 06/18/2017    Colon polyps Father    Irritable bowel syndrome Father    Diverticulitis Father    Diabetes Sister    Hyperlipidemia Sister    Hypertension Sister    Diabetes Maternal Grandmother    Heart disease Maternal Grandmother    Hyperlipidemia Maternal Grandmother    Hypertension Maternal Grandmother    Colon cancer Maternal Grandmother    Heart disease Maternal Grandfather    Hyperlipidemia Maternal Grandfather    Hypertension Maternal Grandfather    Stroke Maternal Grandfather    Liver cancer Maternal Grandfather    Irritable bowel syndrome Maternal Grandfather    Heart disease Paternal Grandmother    Healthy Daughter  Healthy Daughter    Healthy Son    Past Surgical History:  Procedure Laterality Date   BRONCHIAL WASHINGS  01/14/2021   Procedure: BRONCHIAL WASHINGS;  Surgeon: Martina Sinner, MD;  Location: [facility];  Service: Pulmonary;;   CHOLECYSTECTOMY     VIDEO BRONCHOSCOPY N/A 01/14/2021   Procedure: VIDEO BRONCHOSCOPY WITHOUT FLUORO;  Surgeon: Martina Sinner, MD;  Location: [facility];  Service: Pulmonary;  Laterality: N/A;   Social History   Social History Narrative   Lives with wife and daughter at home   Right Handed   Drinks about 2 cups caffeine daily   Immunization History  Administered Date(s) Administered   Fluzone Influenza virus vaccine,trivalent (IIV3), split virus 09/18/2015, 09/14/2016   Influenza Inj Mdck Quad Pf 12/13/2020   Influenza, Seasonal, Injecte, Preservative Fre 09/04/2023   Influenza,inj,Quad PF,6+ Mos 09/14/2016, 09/08/2017, 09/06/2019, 10/07/2021, 09/14/2022   Influenza,inj,quad, With Preservative 09/18/2015   PFIZER(Purple Top)SARS-COV-2 Vaccination 02/19/2020, 03/04/2020, 12/13/2020, 07/03/2021   PNEUMOCOCCAL CONJUGATE-20 04/27/2022   Pneumococcal Polysaccharide-23 09/04/2023   Td 11/11/2016   Tdap 02/28/2019   Zoster Recombinant(Shingrix) 04/27/2022     Objective: Vital Signs: BP 129/86 (BP Location: Left Arm, Patient  Position: Sitting, Cuff Size: Large)   Pulse 88   Resp 17   Ht 6\' 2"  (1.88 m)   Wt 296 lb (134.3 kg)   BMI 38.00 kg/m    Physical Exam Vitals and nursing note reviewed.  Constitutional:      Appearance: He is well-developed.  HENT:     Head: Normocephalic and atraumatic.  Eyes:     Conjunctiva/sclera: Conjunctivae normal.     Pupils: Pupils are equal, round, and reactive to light.  Cardiovascular:     Rate and Rhythm: Normal rate and regular rhythm.     Heart sounds: Normal heart sounds.  Pulmonary:     Effort: Pulmonary effort is normal.     Breath sounds: Normal breath sounds.  Abdominal:     General: Bowel sounds are normal.     Palpations: Abdomen is soft.  Musculoskeletal:     Cervical back: Normal range of motion and neck supple.  Skin:    General: Skin is warm and dry.     Capillary Refill: Capillary refill takes less than 2 seconds.  Neurological:     Mental Status: He is alert and oriented to person, place, and time.  Psychiatric:        Behavior: Behavior normal.      Musculoskeletal Exam: Cervical spine was in good range of motion.  Thoracic and lumbar spine were in good range of motion.  He had some discomfort range of motion of the lumbar spine.  Shoulders, elbows, wrist joints, MCPs PIPs and DIPs with good range of motion.  He had bilateral PIP and DIP thickening with no synovitis.  Hip joints and knee joints in good range of motion.  He had tenderness over bilateral trochanteric bursa.  There was no tenderness over ankles or MTPs.  CDAI Exam: CDAI Score: -- Patient Global: --; Provider Global: -- Swollen: --; Tender: -- Joint Exam 09/28/2023   No joint exam has been documented for this visit   There is currently no information documented on the homunculus. Go to the Rheumatology activity and complete the homunculus joint exam.  Investigation: No additional findings.  Imaging: CT ABDOMEN PELVIS W CONTRAST  Result Date: 09/07/2023 CLINICAL DATA:   Diverticulitis, complication suspected Left flank pain eval for diverticulitis - persistent fever diarrhea x 4 days EXAM: CT  ABDOMEN AND PELVIS WITH CONTRAST TECHNIQUE: Multidetector CT imaging of the abdomen and pelvis was performed using the standard protocol following bolus administration of intravenous contrast. RADIATION DOSE REDUCTION: This exam was performed according to the departmental dose-optimization program which includes automated exposure control, adjustment of the mA and/or kV according to patient size and/or use of iterative reconstruction technique. CONTRAST:  OMNIPAQUE IOHEXOL 300 MG/ML  SOLN COMPARISON:  09/06/2022 FINDINGS: Lower chest: Patchy and geographic ground-glass opacities in both lung bases. This is more diffuse than on prior exam. Hepatobiliary: Diffuse hepatic steatosis the liver is enlarged spanning 21.2 cm cranial caudal. No evidence of focal liver abnormality. Question of early subtle capsular nodularity of the left hepatic lobe, series 2, image 26. Clips in the gallbladder fossa postcholecystectomy. No biliary dilatation. Pancreas: Unremarkable. No pancreatic ductal dilatation or surrounding inflammatory changes. Spleen: Enlarged, 14.7 cm cranial caudal. Adrenals/Urinary Tract: Normal adrenal glands. No hydronephrosis or renal calculi. Subcentimeter hypodensities in the right kidney are too small to characterize. No further follow-up imaging is recommended. No renal calculi. Decompressed urinary bladder. Stomach/Bowel: Inflamed diverticulum in the proximal sigmoid, series 2, image 84, typical of diverticulitis. Additional noninflamed diverticula primarily in the left colon. There is no perforation or abscess. Submucosal fatty infiltration involving the distal sigmoid colon and rectum suggestive of chronic or prior inflammation. Small volume of stool in the colon. The appendix is normal. No small bowel obstruction or inflammatory change. Unremarkable stomach.  Vascular/Lymphatic: Aortic atherosclerosis. No aneurysm. Patent portal vein. Increased number of nonenlarged retroperitoneal lymph nodes, nonspecific. No enlarged lymph nodes by size criteria. Reproductive: Prostate is unremarkable. Other: No ascites. Small fat containing umbilical hernia. No free air or focal fluid collection. Musculoskeletal: Mild chronic T10 superior endplate compression deformity. Stable anterior spurring at the thoracolumbar junction. No acute osseous findings. IMPRESSION: 1. Acute uncomplicated diverticulitis of the proximal sigmoid colon. No perforation or abscess. 2. Hepatomegaly and hepatic steatosis. Question of early subtle capsular nodularity of the left hepatic lobe, can be seen with cirrhosis. Recommend correlation with cirrhosis risk factors. 3. Splenomegaly. 4. Patchy and geographic ground-glass opacities in both lung bases, more diffuse than on prior exam. This may be infectious or inflammatory. Findings are more diffuse than on exams from last year. Recommend follow-up high-resolution chest CT in 3-6 months for further assessment. 5. Small fat containing umbilical hernia. Aortic Atherosclerosis (ICD10-I70.0). Electronically Signed   By: Narda Rutherford M.D.   On: 09/07/2023 18:32   DG Chest Port 1 View  Result Date: 09/06/2023 CLINICAL DATA:  Fever. Questionable sepsis - evaluate for abnormality EXAM: PORTABLE CHEST 1 VIEW COMPARISON:  06/21/2023. FINDINGS: Bilateral lung fields are clear. Bilateral lateral costophrenic angles are clear. Normal cardio-mediastinal silhouette. No acute osseous abnormalities. The soft tissues are within normal limits. IMPRESSION: No active disease. Electronically Signed   By: Jules Schick M.D.   On: 09/06/2023 13:23    Recent Labs: Lab Results  Component Value Date   WBC 7.0 09/14/2023   HGB 14.5 09/14/2023   PLT 302.0 09/14/2023   NA 134 (L) 09/14/2023   K 4.5 09/14/2023   CL 100 09/14/2023   CO2 25 09/14/2023   GLUCOSE 97  09/14/2023   BUN 17 09/14/2023   CREATININE 1.08 09/14/2023   BILITOT 0.7 09/14/2023   ALKPHOS 49 09/14/2023   AST 39 (H) 09/14/2023   ALT 52 09/14/2023   PROT 8.1 09/14/2023   ALBUMIN 3.7 09/14/2023   CALCIUM 9.8 09/14/2023   GFRAA 84 10/14/2020   QFTBGOLDPLUS Negative  01/15/2021    Speciality Comments: Plaquenil 200 mg p.o. twice daily started March 26, 2021  Procedures:  No procedures performed Allergies: Diltiazem hcl   Assessment / Plan:     Visit Diagnoses: Mixed connective tissue disease ([name]) - +ANA,+dsDNA,+RNP,+aCL,+LA, +beta-2 GP 1, +aPS. H/o CVA,PE,DVTs,ILD,arthralgias. -Patient gives history of pain and discomfort in multiple joints without any joint swelling.  He has significant morning stiffness.  He also has interstitial lung disease with shortness of breath.  He states that shortness of breath has been stable.  He lost follow-up due to lack of insurance.  He states he has been getting CellCept through pulmonary office.  He also gives history of Raynaud's phenomenon.  There is no history of oral ulcers, nasal ulcers, sicca symptoms, malar rash, photosensitivity or lymphadenopathy.  Plan: ANA, Anti-scleroderma antibody, RNP Antibody, Sjogrens syndrome-A extractable nuclear antibody, Anti-Smith antibody, Sjogrens syndrome-B extractable nuclear antibody, Anti-DNA antibody, double-stranded, C3 and C4, Beta-2 glycoprotein antibodies, Cardiolipin antibodies, IgG, IgM, IgA, Lupus Anticoagulant Eval w/Reflex  ILD (interstitial lung disease) ([name]) - He is on CellCept by Dr. Francine Graven.  He has not seen Dr. Francine Graven in a couple of years as well.  He states he has been getting prescription on a regular basis.  He denies any increased shortness of breath.  He has an appointment coming up with him in December.  He needs repeat high-resolution CT.  His last high-resolution CT was in September 2022 which was stable.  Patient will schedule appointment with the cardiologist as well.  Screening for  pulmonary hypertension was advised.  APS (antiphospholipid syndrome) ([name]) -patient is followed by hematology.  Positive lupus anticoagulant, antiphosphatidylserine, positive anticardiolipin, positive beta-2 GP 1. Hx of DVT, PE, CVA. He is on lifelong coumadin.  High risk medication use - He is taking CellCept 1 g p.o. twice daily as monotherapy.  Labs from September 14, 2023 were reviewed.  CBC and CMP were stable.  Mild elevation of LFTs was noted.  He was advised to get labs every 3 months while he is on CellCept.  Information on immunization was placed in the AVS.  He was advised to hold CellCept if he develops a serious infection.  Chronic midline low back pain with bilateral sciatica-he continues to have lower back pain.  Core strengthening exercises were discussed.  He is also trying intentional weight loss which will be helpful.  Pain in both hands-he complains of pain and stiffness in his bilateral hands.  No synovitis was noted.  He has bilateral PIP and DIP thickening consistent with osteoarthritis.  Joint protection muscle strengthening was advised.  A handout on hand exercises was given.  He is requesting FMLA for flares.  Patient requests FMLA for 2 days a month for appointments and flares.  Trochanteric bursitis of both hips-he has tenderness over bilateral trochanteric region.  He has difficulty getting in and out of the truck.  He is requesting FMLA for flares.  Cerebrovascular accident (CVA), unspecified mechanism ([name]) - He was started on Coumadin in April 2022 after CVA.  He developed expressive aphasia.  Deep vein thrombosis (DVT) of right lower extremity, unspecified chronicity, unspecified vein ([name])  Multiple pulmonary emboli [facility]) - September 2019.  He remains on Coumadin.  Pulmonary infiltrates  Multinodular goiter  Hypertensive heart disease without heart failure  Nutcracker esophagus  Mixed hyperlipidemia-he is on Crestor  GAD (generalized anxiety  disorder)  Esophageal spasm  Other fatigue  OSA on CPAP  Orders: Orders Placed This Encounter  Procedures   ANA  Anti-scleroderma antibody   RNP Antibody   Sjogrens syndrome-A extractable nuclear antibody   Anti-Smith antibody   Sjogrens syndrome-B extractable nuclear antibody   Anti-DNA antibody, double-stranded   C3 and C4   Beta-2 glycoprotein antibodies   Cardiolipin antibodies, IgG, IgM, IgA   Lupus Anticoagulant Eval w/Reflex   No orders of the defined types were placed in this encounter.  Face-to-face time spent patient was over 40 minutes.  More than 50% time was spent in counseling and coordination of care.  Follow-Up Instructions: Return in about 2 months (around 11/28/2023) for ILD,MCTD.   Pollyann Savoy, MD  Note - This record has been created using Animal nutritionist.  Chart creation errors have been sought, but may not always  have been located. Such creation errors do not reflect on  the standard of medical care.

## 2023-09-16 NOTE — Progress Notes (Signed)
His A1c is stable.  His sodium is improving.  Kidney function looks much better and is back to normal.  No signs of dehydration based on his labs.  Do not need to do any other testing at this point.  He should let us know if his symptoms do not continue to improve.  We can recheck blood work at her next office visit.

## 2023-09-16 NOTE — Telephone Encounter (Signed)
He should make sure that he is getting plenty of fluids.  He can stretch stretching his legs before bed.  He should follow-up with [place] if this is an ongoing issue.

## 2023-09-17 NOTE — Telephone Encounter (Signed)
Left message to return call to our office at their convenience.  

## 2023-09-22 ENCOUNTER — Ambulatory Visit: Payer: BC Managed Care – PPO

## 2023-09-22 DIAGNOSIS — Z7901 Long term (current) use of anticoagulants: Secondary | ICD-10-CM | POA: Diagnosis not present

## 2023-09-22 LAB — POCT INR: INR: 3.1 — AB (ref 2.0–3.0)

## 2023-09-22 NOTE — Patient Instructions (Addendum)
Pre visit review using our clinic review tool, if applicable. No additional management support is needed unless otherwise documented below in the visit note.  Continue 2 tablets daily except take 3 tablets on Mondays. Recheck in 4 weeks.

## 2023-09-22 NOTE — Progress Notes (Signed)
I have reviewed the patient's encounter and agree with the documentation.  Katina Degree. [name], MD 09/22/2023 8:53 AM

## 2023-09-22 NOTE — Progress Notes (Signed)
Continue 2 tablets daily except take 3 tablets on Mondays. Recheck in 4 weeks.

## 2023-09-28 ENCOUNTER — Encounter: Payer: Self-pay | Admitting: Rheumatology

## 2023-09-28 ENCOUNTER — Ambulatory Visit: Payer: BC Managed Care – PPO | Attending: Rheumatology | Admitting: Rheumatology

## 2023-09-28 VITALS — BP 129/86 | HR 88 | Resp 17 | Ht 74.0 in | Wt 296.0 lb

## 2023-09-28 DIAGNOSIS — M351 Other overlap syndromes: Secondary | ICD-10-CM

## 2023-09-28 DIAGNOSIS — K224 Dyskinesia of esophagus: Secondary | ICD-10-CM

## 2023-09-28 DIAGNOSIS — M79641 Pain in right hand: Secondary | ICD-10-CM

## 2023-09-28 DIAGNOSIS — M5441 Lumbago with sciatica, right side: Secondary | ICD-10-CM

## 2023-09-28 DIAGNOSIS — F411 Generalized anxiety disorder: Secondary | ICD-10-CM

## 2023-09-28 DIAGNOSIS — E042 Nontoxic multinodular goiter: Secondary | ICD-10-CM

## 2023-09-28 DIAGNOSIS — G4733 Obstructive sleep apnea (adult) (pediatric): Secondary | ICD-10-CM

## 2023-09-28 DIAGNOSIS — I2699 Other pulmonary embolism without acute cor pulmonale: Secondary | ICD-10-CM

## 2023-09-28 DIAGNOSIS — M5442 Lumbago with sciatica, left side: Secondary | ICD-10-CM

## 2023-09-28 DIAGNOSIS — J849 Interstitial pulmonary disease, unspecified: Secondary | ICD-10-CM

## 2023-09-28 DIAGNOSIS — D6861 Antiphospholipid syndrome: Secondary | ICD-10-CM

## 2023-09-28 DIAGNOSIS — G8929 Other chronic pain: Secondary | ICD-10-CM

## 2023-09-28 DIAGNOSIS — I119 Hypertensive heart disease without heart failure: Secondary | ICD-10-CM

## 2023-09-28 DIAGNOSIS — Z79899 Other long term (current) drug therapy: Secondary | ICD-10-CM

## 2023-09-28 DIAGNOSIS — M79642 Pain in left hand: Secondary | ICD-10-CM

## 2023-09-28 DIAGNOSIS — I639 Cerebral infarction, unspecified: Secondary | ICD-10-CM

## 2023-09-28 DIAGNOSIS — E782 Mixed hyperlipidemia: Secondary | ICD-10-CM

## 2023-09-28 DIAGNOSIS — M7061 Trochanteric bursitis, right hip: Secondary | ICD-10-CM

## 2023-09-28 DIAGNOSIS — I82401 Acute embolism and thrombosis of unspecified deep veins of right lower extremity: Secondary | ICD-10-CM

## 2023-09-28 DIAGNOSIS — R5383 Other fatigue: Secondary | ICD-10-CM

## 2023-09-28 DIAGNOSIS — R918 Other nonspecific abnormal finding of lung field: Secondary | ICD-10-CM

## 2023-09-28 DIAGNOSIS — M7062 Trochanteric bursitis, left hip: Secondary | ICD-10-CM

## 2023-09-28 NOTE — Patient Instructions (Addendum)
Standing Labs We placed an order today for your standing lab work.   Please have your standing labs drawn in January and every 3 months  Please have your labs drawn 2 weeks prior to your appointment so that the provider can discuss your lab results at your appointment, if possible.  Please note that you may see your imaging and lab results in MyChart before we have reviewed them. We will contact you once all results are reviewed. Please allow our office up to 72 hours to thoroughly review all of the results before contacting the office for clarification of your results.  WALK-IN LAB HOURS  Monday through Thursday from 8:00 am -12:30 pm and 1:00 pm-5:00 pm and Friday from 8:00 am-12:00 pm.  Patients with office visits requiring labs will be seen before walk-in labs.  You may encounter longer than normal wait times. Please allow additional time. Wait times may be shorter on  Monday and Thursday afternoons.  We do not book appointments for walk-in labs. We appreciate your patience and understanding with our staff.   Labs are drawn by [facility]. Please bring your co-pay at the time of your lab draw.  You may receive a bill from [facility] for your lab work.  Please note if you are on Hydroxychloroquine and and an order has been placed for a Hydroxychloroquine level,  you will need to have it drawn 4 hours or more after your last dose.  If you wish to have your labs drawn at another location, please call the office 24 hours in advance so we can fax the orders.  The office is located at [address], [place], [place] [address]   If you have any questions regarding directions or hours of operation,  please call [phone].   As a reminder, please drink plenty of water prior to coming for your lab work. Thanks!   Vaccines You are taking a medication(s) that can suppress your immune system.  The following immunizations are recommended: Flu annually Covid-19 RSV Td/Tdap (tetanus,  diphtheria, pertussis) every 10 years Pneumonia (Prevnar 15 then Pneumovax 23 at least 1 year apart.  Alternatively, can take Prevnar 20 without needing additional dose) Shingrix: 2 doses from 4 weeks to 6 months apart  Please check with your PCP to make sure you are up to date.   If you have signs or symptoms of an infection or start antibiotics: First, call your PCP for workup of your infection. Hold your medication through the infection, until you complete your antibiotics, and until symptoms resolve if you take the following: Injectable medication (Actemra, Benlysta, Cimzia, Cosentyx, Enbrel, Humira, Kevzara, Orencia, Remicade, Simponi, Stelara, Taltz, Tremfya) Methotrexate Leflunomide (Arava) Mycophenolate (Cellcept) [place]borne [place], or Rinvoq  Hand Exercises Hand exercises can be helpful for almost anyone. They can strengthen your hands and improve flexibility and movement. The exercises can also increase blood flow to the hands. These results can make your work and daily tasks easier for you. Hand exercises can be especially helpful for people who have joint pain from arthritis or nerve damage from using their hands over and over. These exercises can also help people who injure a hand. Exercises Most of these hand exercises are gentle stretching and motion exercises. It is usually safe to do them often throughout the day. Warming up your hands before exercise may help reduce stiffness. You can do this with gentle massage or by placing your hands in warm water for 10-15 minutes. It is normal to feel some  stretching, pulling, tightness, or mild discomfort when you begin new exercises. In time, this will improve. Remember to always be careful and stop right away if you feel sudden, very bad pain or your pain gets worse. You want to get better and be safe. Ask your health care provider which exercises are safe for you. Do exercises exactly as told by your provider and adjust them as told.  Do not begin these exercises until told by your provider. Knuckle bend or "claw" fist  Stand or sit with your arm, hand, and all five fingers pointed straight up. Make sure to keep your wrist straight. Gently bend your fingers down toward your palm until the tips of your fingers are touching your palm. Keep your big knuckle straight and only bend the small knuckles in your fingers. Hold this position for 10 seconds. Straighten your fingers back to your starting position. Repeat this exercise 5-10 times with each hand. Full finger fist  Stand or sit with your arm, hand, and all five fingers pointed straight up. Make sure to keep your wrist straight. Gently bend your fingers into your palm until the tips of your fingers are touching the middle of your palm. Hold this position for 10 seconds. Extend your fingers back to your starting position, stretching every joint fully. Repeat this exercise 5-10 times with each hand. Straight fist  Stand or sit with your arm, hand, and all five fingers pointed straight up. Make sure to keep your wrist straight. Gently bend your fingers at the big knuckle, where your fingers meet your hand, and at the middle knuckle. Keep the knuckle at the tips of your fingers straight and try to touch the bottom of your palm. Hold this position for 10 seconds. Extend your fingers back to your starting position, stretching every joint fully. Repeat this exercise 5-10 times with each hand. Tabletop  Stand or sit with your arm, hand, and all five fingers pointed straight up. Make sure to keep your wrist straight. Gently bend your fingers at the big knuckle, where your fingers meet your hand, as far down as you can. Keep the small knuckles in your fingers straight. Think of forming a tabletop with your fingers. Hold this position for 10 seconds. Extend your fingers back to your starting position, stretching every joint fully. Repeat this exercise 5-10 times with each  hand. Finger spread  Place your hand flat on a table with your palm facing down. Make sure your wrist stays straight. Spread your fingers and thumb apart from each other as far as you can until you feel a gentle stretch. Hold this position for 10 seconds. Bring your fingers and thumb tight together again. Hold this position for 10 seconds. Repeat this exercise 5-10 times with each hand. Making circles  Stand or sit with your arm, hand, and all five fingers pointed straight up. Make sure to keep your wrist straight. Make a circle by touching the tip of your thumb to the tip of your index finger. Hold for 10 seconds. Then open your hand wide. Repeat this motion with your thumb and each of your fingers. Repeat this exercise 5-10 times with each hand. Thumb motion  Sit with your forearm resting on a table and your wrist straight. Your thumb should be facing up toward the ceiling. Keep your fingers relaxed as you move your thumb. Lift your thumb up as high as you can toward the ceiling. Hold for 10 seconds. Bend your thumb across your palm as  far as you can, reaching the tip of your thumb for the small finger (pinkie) side of your palm. Hold for 10 seconds. Repeat this exercise 5-10 times with each hand. Grip strengthening  Hold a stress ball or other soft ball in the middle of your hand. Slowly increase the pressure, squeezing the ball as much as you can without causing pain. Think of bringing the tips of your fingers into the middle of your palm. All of your finger joints should bend when doing this exercise. Hold your squeeze for 10 seconds, then relax. Repeat this exercise 5-10 times with each hand. Contact a health care provider if: Your hand pain or discomfort gets much worse when you do an exercise. Your hand pain or discomfort does not improve within 2 hours after you exercise. If you have either of these problems, stop doing these exercises right away. Do not do them again unless  your provider says that you can. Get help right away if: You develop sudden, severe hand pain or swelling. If this happens, stop doing these exercises right away. Do not do them again unless your provider says that you can. This information is not intended to replace advice given to you by your health care provider. Make sure you discuss any questions you have with your health care provider. Document Revised: 12/01/2022 Document Reviewed: 12/01/2022 Elsevier Patient Education  2024 Elsevier Inc.  Iliotibial Band Syndrome Rehab Ask your health care provider which exercises are safe for you. Do exercises exactly as told by your provider and adjust them as told. It's normal to feel mild stretching, pulling, tightness, or discomfort as you do these exercises. Stop right away if you feel sudden pain or your pain gets a lot worse. Do not begin these exercises until told by your provider. Stretching and range-of-motion exercises These exercises warm up your muscles and joints. They also improve the movement and flexibility of your hip and pelvis. Quadriceps stretch, prone  Lie face down (prone) on a firm surface like a bed or padded floor. Bend your left / right knee. Reach back to hold your ankle or pant leg. If you can't reach your ankle or pant leg, use a belt looped around your foot and grab the belt instead. Gently pull your heel toward your butt. Your knee should not slide out to the side. You should feel a stretch in the front of your thigh and knee, also called the quadriceps. Hold this position for __________ seconds. Repeat __________ times. Complete this exercise __________ times a day. Iliotibial band stretch The iliotibial band is a strip of tissue that runs along the outside of your hip down to your knee. Lie on your side with your left / right leg on top. Bend both knees and grab your left / right ankle. Stretch out your bottom arm to help you balance. Slowly bring your top knee back  so your thigh goes behind your back. Slowly lower your top leg toward the floor until you feel a gentle stretch on the outside of your left / right hip and thigh. If you don't feel a stretch and your knee won't go farther, place the heel of your other foot on top of your knee and pull your knee down toward the floor with your foot. Hold this position for __________ seconds. Repeat __________ times. Complete this exercise __________ times a day. Strengthening exercises These exercises build strength and endurance in your hip and pelvis. Endurance means your muscles can keep working even  when they're tired. Straight leg raises, side-lying This exercise strengthens the muscles that rotate the leg at the hip and move it away from your body. These muscles are called hip abductors. Lie on your side with your left / right leg on top. Lie so your head, shoulder, hip, and knee line up. You can bend your bottom knee to help you balance. Roll your hips slightly forward so they're stacked directly over each other. Your left / right knee should face forward. Tense the muscles in your outer thigh and hip. Lift your top leg 4-6 inches (10-15 cm) off the ground. Hold this position for __________ seconds. Slowly lower your leg back down to the starting position. Let your muscles fully relax before doing this exercise again. Repeat __________ times. Complete this exercise __________ times a day. Leg raises, prone This exercise strengthens the muscles that move the hips backward. These muscles are called hip extensors. Lie face down (prone) on your bed or a firm surface. You can put a pillow under your hips for comfort and to support your lower back. Bend your left / right knee so your foot points straight up toward the ceiling. Keep the other leg straight and behind you. Squeeze your butt muscles. Lift your left / right thigh off the firm surface. Do not let your back arch. Tense your thigh muscle as hard as you  can without having more knee pain. Hold this position for __________ seconds. Slowly lower your leg to the starting position. Allow your leg to relax all the way. Repeat __________ times. Complete this exercise __________ times a day. Hip hike  Stand sideways on a bottom step. Place your feet so that your left / right leg is on the step, and the other foot is hanging off the side. If you need support for balance, hold onto a railing or wall. Keep your knees straight and your abdomen square, meaning your hips are level. Then, lift your left / right hip up toward the ceiling. Slowly let your leg that's hanging off the step lower towards the floor. Your foot should get closer to the ground. Do not lean or bend your knees during this movement. Repeat __________ times. Complete this exercise __________ times a day. This information is not intended to replace advice given to you by your health care provider. Make sure you discuss any questions you have with your health care provider. Document Revised: 01/29/2023 Document Reviewed: 01/29/2023 Elsevier Patient Education  2024 [employer].

## 2023-09-29 ENCOUNTER — Telehealth: Payer: Self-pay | Admitting: *Deleted

## 2023-09-29 NOTE — Telephone Encounter (Signed)
Attempted to contact patient and left message to advise patient we have not received FMLA paperwork for him to complete. Advised patient to have paperwork to our office and provided fax number.   Per Dr. Corliss Skains okay to complete paperwork for 2 days per month.

## 2023-10-03 LAB — RFX DRVVT 1:1 MIX: PT, Mixing Interp: POSITIVE — AB

## 2023-10-03 LAB — CARDIOLIPIN ANTIBODIES, IGG, IGM, IGA
Anticardiolipin IgA: >65 [APL'U]/mL — ABNORMAL HIGH (ref <20.0)
Anticardiolipin IgG: >112 [GPL'U]/mL — ABNORMAL HIGH (ref <20.0)
Anticardiolipin IgM: 3.5 [MPL'U]/mL (ref <20.0)

## 2023-10-03 LAB — RNP ANTIBODY: Ribonucleic Protein(ENA) Antibody, IgG: 1.9 AI — AB

## 2023-10-03 LAB — ANA: Anti Nuclear Antibody (ANA): NEGATIVE

## 2023-10-03 LAB — SJOGRENS SYNDROME-A EXTRACTABLE NUCLEAR ANTIBODY: SSA (Ro) (ENA) Antibody, IgG: <1 AI

## 2023-10-03 LAB — C3 AND C4
C3 Complement: 168 mg/dL (ref 82–185)
C4 Complement: 29 mg/dL (ref 15–53)

## 2023-10-03 LAB — BETA-2 GLYCOPROTEIN ANTIBODIES
Beta-2 Glyco 1 IgA: >65 U/mL — ABNORMAL HIGH (ref <20.0)
Beta-2 Glyco 1 IgM: 3.1 U/mL (ref <20.0)
Beta-2 Glyco I IgG: >112 U/mL — ABNORMAL HIGH (ref <20.0)

## 2023-10-03 LAB — LUPUS ANTICOAGULANT EVAL W/ REFLEX
PTT-LA Screen: 66 s — ABNORMAL HIGH (ref <=40)
dRVVT: 94 s — ABNORMAL HIGH (ref <=45)

## 2023-10-03 LAB — SJOGRENS SYNDROME-B EXTRACTABLE NUCLEAR ANTIBODY: SSB (La) (ENA) Antibody, IgG: <1 AI

## 2023-10-03 LAB — THROMBIN CLOTTING TIME: Thrombin Clotting Time: 17 s (ref 13–19)

## 2023-10-03 LAB — ANTI-SMITH ANTIBODY: ENA SM Ab Ser-aCnc: <1 AI

## 2023-10-03 LAB — ANTI-SCLERODERMA ANTIBODY: Scleroderma (Scl-70) (ENA) Antibody, IgG: <1 AI

## 2023-10-03 LAB — ANTI-DNA ANTIBODY, DOUBLE-STRANDED: ds DNA Ab: 76 [IU]/mL — ABNORMAL HIGH

## 2023-10-03 LAB — RFLX DRVVT CONFRIM: DRVVT CONFIRM: POSITIVE — AB

## 2023-10-03 LAB — RFLX HEXAGONAL PHASE CONFIRM: Hexagonal Phase Conf: POSITIVE — AB

## 2023-10-04 NOTE — Progress Notes (Signed)
dsDNA +, RNP+, b2GP1 +, aCL Ab +, Lupus anticoagulant +, ( ANA, SSA, SSB, Sm, Scl 70) negative, C3 and C4 normal. Labs do not indicate an autoimmune disease flare.  No change in treatment advised.  Please forward results to his pulmonologist and hematologist.

## 2023-10-05 ENCOUNTER — Ambulatory Visit: Payer: BC Managed Care – PPO | Admitting: Family Medicine

## 2023-10-05 ENCOUNTER — Encounter: Payer: Self-pay | Admitting: Family Medicine

## 2023-10-05 VITALS — BP 114/75 | HR 88 | Temp 98.1°F | Ht 74.0 in | Wt 296.4 lb

## 2023-10-05 DIAGNOSIS — D6861 Antiphospholipid syndrome: Secondary | ICD-10-CM | POA: Diagnosis not present

## 2023-10-05 DIAGNOSIS — K5790 Diverticulosis of intestine, part unspecified, without perforation or abscess without bleeding: Secondary | ICD-10-CM | POA: Diagnosis not present

## 2023-10-05 DIAGNOSIS — R197 Diarrhea, unspecified: Secondary | ICD-10-CM | POA: Diagnosis not present

## 2023-10-05 LAB — CBC WITH DIFFERENTIAL/PLATELET
Basophils Absolute: 0.1 10*3/uL (ref 0.0–0.1)
Basophils Relative: 0.9 % (ref 0.0–3.0)
Eosinophils Absolute: 0.3 10*3/uL (ref 0.0–0.7)
Eosinophils Relative: 3.9 % (ref 0.0–5.0)
HCT: 42.4 % (ref 39.0–52.0)
Hemoglobin: 14.2 g/dL (ref 13.0–17.0)
Lymphocytes Relative: 29.9 % (ref 12.0–46.0)
Lymphs Abs: 1.9 10*3/uL (ref 0.7–4.0)
MCHC: 33.4 g/dL (ref 30.0–36.0)
MCV: 88 fL (ref 78.0–100.0)
Monocytes Absolute: 0.5 10*3/uL (ref 0.1–1.0)
Monocytes Relative: 8.2 % (ref 3.0–12.0)
Neutro Abs: 3.7 10*3/uL (ref 1.4–7.7)
Neutrophils Relative %: 57.1 % (ref 43.0–77.0)
Platelets: 273 10*3/uL (ref 150.0–400.0)
RBC: 4.82 Mil/uL (ref 4.22–5.81)
RDW: 13.5 % (ref 11.5–15.5)
WBC: 6.5 10*3/uL (ref 4.0–10.5)

## 2023-10-05 LAB — PROTIME-INR
INR: 1.9 {ratio} — ABNORMAL HIGH (ref 0.8–1.0)
Prothrombin Time: 19.7 s — ABNORMAL HIGH (ref 9.6–13.1)

## 2023-10-05 LAB — COMPREHENSIVE METABOLIC PANEL
ALT: 25 U/L (ref 0–53)
AST: 23 U/L (ref 0–37)
Albumin: 3.7 g/dL (ref 3.5–5.2)
Alkaline Phosphatase: 48 U/L (ref 39–117)
BUN: 12 mg/dL (ref 6–23)
CO2: 29 meq/L (ref 19–32)
Calcium: 9.3 mg/dL (ref 8.4–10.5)
Chloride: 99 meq/L (ref 96–112)
Creatinine, Ser: 0.98 mg/dL (ref 0.40–1.50)
GFR: 89.28 mL/min (ref >60.00)
Glucose, Bld: 134 mg/dL — ABNORMAL HIGH (ref 70–99)
Potassium: 3.2 meq/L — ABNORMAL LOW (ref 3.5–5.1)
Sodium: 135 meq/L (ref 135–145)
Total Bilirubin: 0.8 mg/dL (ref 0.2–1.2)
Total Protein: 8.2 g/dL (ref 6.0–8.3)

## 2023-10-05 LAB — MAGNESIUM: Magnesium: 1.7 mg/dL (ref 1.5–2.5)

## 2023-10-05 MED ORDER — POTASSIUM CHLORIDE CRYS ER 20 MEQ PO TBCR: 20.0000 meq | EXTENDED_RELEASE_TABLET | Freq: Every day | ORAL | 0 refills | Status: DC

## 2023-10-05 MED ORDER — AMOXICILLIN-POT CLAVULANATE 875-125 MG PO TABS: 1.0000 | ORAL_TABLET | Freq: Two times a day (BID) | ORAL | 0 refills | Status: DC

## 2023-10-05 NOTE — Progress Notes (Addendum)
Subjective:     Patient ID: Wesley Hancock, male    DOB: 05/21/1971, 52 y.o.   MRN: [id number]  Chief Complaint  Patient presents with   Diarrhea    Daily multiped times   Follow-up    Diverticulitis    Nausea    With food    Fever    At home 100.1 F last night     HPI  Diarrhea: He complains of diarrhea, abdominal pain, nausea starting several weeks ago. Reports diarrhea 4 times today and 3 times yesterday. Has not been waking him up at night to use the bathroom. States his diarrhea is very watery and appears to be yellow though he notes he is color blind. Also had fever of 100.1 last night and has been taking Tylenol since then. No blood in stool. Since having nausea, he has a decreased appetite and is not been able to eat much. Pt taking omeprazole 20 mg BID. Has had to take 2 days off of work due to his symptoms. Presented to the ED twice on 10/7 and 10/8. Was given augmentin  Has intentionally lost 20 lbs with lifestyle changes such as diet and exercise to better manage his lupus and interstitial lung disease. He states his dietary changes have not affected his GI upset. Was previously started on Augmentin on 10/9, which helped his sx. After finishing his Augmentin, his sx returned. Of note, has had cholecystectomy.  Has not been able to contact GI yet, but plans to call them back later today. Works as a [employer], so sometimes finds it difficult to answer the phone throughout the day.   Health Maintenance Due  Topic Date Due   Colonoscopy  Never done   Lung Cancer Screening  10/28/2023    Past Medical History:  Diagnosis Date   Acute nonintractable headache 09/06/2019   Acute respiratory failure with hypoxia ([name]) 08/30/2019   AKI (acute kidney injury) ([name]) 08/30/2019   Antiphospholipid syndrome ([name]) 10/30/2019   Anxiety    APS (antiphospholipid syndrome) ([name])    Arthritis    Colon polyp    ? hyperplastic   Depression    Diarrhea 05/21/2020   DVT (deep  venous thrombosis) ([name])    Dyspnea on exertion 09/08/2017   "Quit smoking" 2015 with onset of symptoms in 2016  Spirometry 09/08/2017  Flat f/v loop  - d/c acei 09/08/2017  - 01/04/2020   Walked RA x two laps =  approx 574ft @ fast pace - stopped due to end of study/ min sob with sats of 93 % at the end of the study. - PFT's  03/08/20   FEV1 3.84 (83 % ) ratio 0..90  p 3 % improvement from saba p ? 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 06/26/20, 03/08/20   Snoring 07/09/2020   Stress fracture    in back, seeing PT   Stroke [facility])    Upper airway cough syndrome 01/05/2020   Onset mid Jan 2021 while on otc PPI  - max rx for gerd 01/04/2020 >>>      Witnessed apneic spells 07/09/2020    Past Surgical History:  Procedure Laterality Date   BRONCHIAL WASHINGS  01/14/2021   Procedure: BRONCHIAL WASHINGS;  Surgeon: Martina Sinner, MD;  Location: [facility];  Service: Pulmonary;;   CHOLECYSTECTOMY     VIDEO BRONCHOSCOPY N/A 01/14/2021   Procedure: VIDEO BRONCHOSCOPY WITHOUT FLUORO;  Surgeon: Martina Sinner, MD;  Location: [facility];  Service: Pulmonary;   Laterality: N/A;     Current Outpatient Medications:    acetaminophen (TYLENOL) 500 MG tablet, Take 1,000 mg by mouth every 6 (six) hours as needed for mild pain., Disp: , Rfl:    albuterol (VENTOLIN HFA) 108 (90 Base) MCG/ACT inhaler, Inhale 2 puffs into the lungs every 6 (six) hours as needed for wheezing or shortness of breath., Disp: 8 g, Rfl: 6   amoxicillin-clavulanate (AUGMENTIN) 875-125 MG tablet, Take 1 tablet by mouth 2 (two) times daily., Disp: 20 tablet, Rfl: 0   buPROPion (WELLBUTRIN) 75 MG tablet, TAKE 1 TABLET BY MOUTH TWICE A DAY, Disp: 180 tablet, Rfl: 1   escitalopram (LEXAPRO) 20 MG tablet, TAKE 1 TABLET BY MOUTH EVERY DAY, Disp: 90 tablet, Rfl: 1   Evolocumab (REPATHA SURECLICK) 140 MG/ML SOAJ, Inject 140 mg into the skin every 14 (fourteen) days., Disp: 6 mL, Rfl: 3   fluticasone (FLONASE) 50 MCG/ACT nasal spray, Place 1 spray into both nostrils daily as needed for allergies., Disp: , Rfl:    hydrochlorothiazide (HYDRODIURIL) 25 MG tablet, Take 1 tablet (25 mg total) by mouth daily., Disp: 90 tablet, Rfl: 2   Multiple Vitamin (MULTIVITAMIN) tablet, Take by mouth daily. 2 chews daily, Disp: , Rfl:    mycophenolate (CELLCEPT) 500 MG tablet, TAKE 2 TABLETS BY MOUTH TWICE DAILY, Disp: 360 tablet, Rfl: 1   nitroGLYCERIN (NITROSTAT) 0.4 MG SL tablet, Place 1 tablet (0.4 mg total) under the tongue every 5 (five) minutes as needed for chest pain., Disp: 90 tablet, Rfl: 3   omeprazole (PRILOSEC) 20 MG capsule, Take 20 mg by mouth 2 (two) times daily before a meal., Disp: , Rfl:    rosuvastatin (CRESTOR) 20 MG tablet, Take 1 tablet (20 mg total) by mouth daily., Disp: 90 tablet, Rfl: 1   telmisartan (MICARDIS) 80 MG tablet, Take 1 tablet (80 mg total) by mouth daily., Disp: 90 tablet, Rfl: 3   triamcinolone cream (KENALOG) 0.1 %, Apply to affected area 1-2 times daily (Patient taking differently: as needed. Apply to affected area 1-2 times daily), Disp: 30 g, Rfl: 0   warfarin  (COUMADIN) 5 MG tablet, TAKE 2 TABLETS BY MOUTH DAILY EXCEPT TAKE 3 TABLETS ON MONDAYS OR AS DIRECTED BY ANTICOAGULATION CLINIC, Disp: 200 tablet, Rfl: 1  Allergies  Allergen Reactions   Diltiazem Hcl Diarrhea and Other (See Comments)    Lethargic    ROS neg/noncontributory except as noted HPI/below      Objective:     BP 114/75   Pulse 88   Temp 98.1 F (36.7 C) (Temporal)   Ht 6\' 2"  (1.88 m)  Wt 296 lb 6.4 oz (134.4 kg)   SpO2 96%   BMI 38.06 kg/m  Wt Readings from Last 3 Encounters:  10/05/23 296 lb 6.4 oz (134.4 kg)  09/28/23 296 lb (134.3 kg)  09/14/23 (!) 302 lb 6.4 oz (137.2 kg)    Physical Exam   Gen: WDWN NAD HEENT: NCAT, conjunctiva not injected, sclera nonicteric NECK:  supple, no thyromegaly, no nodes, no carotid bruits CARDIAC: RRR, S1S2+, no murmur.  LUNGS: CTAB. No wheezes ABDOMEN:  BS+, soft, +diffusely moderately tender, No HSM, no masses EXT:  no edema MSK: no gross abnormalities.  NEURO: A&O x3.  CN II-XII intact.  PSYCH: normal mood. Good eye contact  Reviewed ER records and other notes/labs as well     Assessment & Plan:  Diarrhea, unspecified type -     CBC with Differential/Platelet -     Comprehensive metabolic panel -     Magnesium  Diverticulosis -     CBC with Differential/Platelet -     Comprehensive metabolic panel -     Magnesium  APS (antiphospholipid syndrome) ([name]) -     Protime-INR  Other orders -     Amoxicillin-Pot Clavulanate; Take 1 tablet by mouth 2 (two) times daily.  Dispense: 20 tablet; Refill: 0  1.  Diarrhea-suspect from diverticulitis.  Not so sure ever fully resolved.  He will call GI to get that appointment set up.  Cipro and Flagyl have some drug interactions with his warfarin and cellcept.  Will do another 10 days of Augmentin 875 twice daily.  Will check labs.  If things or not improving, worsening, let us know 2.  Antiphospholipid syndrome-patient is on warfarin.  Will check PT/INR as he has been on  antibiotics and sick  Return if symptoms worsen or fail to improve.    I, Isabelle Course, acting as a scribe for Angelena Sole, MD., have documented all relevant documentation on the behalf of Angelena Sole, MD, as directed by  Angelena Sole, MD while in the presence of Angelena Sole, MD.  I, Angelena Sole, MD, have reviewed all documentation for this visit. The documentation on 10/05/23 for the exam, diagnosis, procedures, and orders are all accurate and complete.  Angelena Sole, MD

## 2023-10-05 NOTE — Progress Notes (Signed)
INR sl low-f/u coumadin clinic Potassium low-I sent in potassium daily-needs to repeat in 1 wk and please place order under Dr [name]

## 2023-10-05 NOTE — Patient Instructions (Signed)
Hold crestor while on cipro.    Call GI

## 2023-10-05 NOTE — Addendum Note (Signed)
Addended by: Angelena Sole on: 10/05/2023 08:23 PM   Modules accepted: Orders

## 2023-10-06 ENCOUNTER — Other Ambulatory Visit: Payer: Self-pay | Admitting: *Deleted

## 2023-10-06 ENCOUNTER — Telehealth: Payer: Self-pay

## 2023-10-06 ENCOUNTER — Other Ambulatory Visit: Payer: Self-pay | Admitting: Medical Genetics

## 2023-10-06 DIAGNOSIS — E876 Hypokalemia: Secondary | ICD-10-CM

## 2023-10-06 DIAGNOSIS — Z006 Encounter for examination for normal comparison and control in clinical research program: Secondary | ICD-10-CM

## 2023-10-06 NOTE — Telephone Encounter (Signed)
Pt called to report he saw a provider, Dr. Ruthine Dose, yesterday for diarrhea. Lab INR was performed and result back today is 1.9. This is subtherapeutic. Pt also reported he was started on Augmentin, x 10 days. No interaction with this abx. Advised pt he should increase warfarin dose today to take 3 tablets (15 mg) and then resume prior dosing. Moved coumadin clinic apt from 2 weeks to next week.  Advised, per provider, pt needs another lab K test also in one week. Will also schedule pt for lab apt for same day as coumadin clinic next week. Advised if any other changes to contact coumadin clinic. Pt verbalized understanding.

## 2023-10-06 NOTE — Telephone Encounter (Signed)
Transition Care Management Unsuccessful Follow-up Telephone Call  Date of discharge and from where:  09/07/2023 [facility]  Attempts:  1st Attempt  Reason for unsuccessful TCM follow-up call:  Voice mail full  Wladyslaw Henrichs [facility]  [facility], [facility] Resource Care Guide Direct Dial: [phone]  Website: [email or web address]

## 2023-10-07 ENCOUNTER — Telehealth: Payer: Self-pay | Admitting: Pulmonary Disease

## 2023-10-07 NOTE — Telephone Encounter (Signed)
Received FMLA forms on 08/09/23 - could not be filled out because patient has not been see since June 2023.   Patient has appointment with Dr. Francine Graven on 12/19 - form will be completed during that appointment.   Need to fill in duration of the condition, frequency of episodes and duration, effect of illness on patient's ability to perform his job.

## 2023-10-08 ENCOUNTER — Telehealth: Payer: Self-pay

## 2023-10-08 NOTE — Telephone Encounter (Signed)
Transition Care Management Unsuccessful Follow-up Telephone Call  Date of discharge and from where:  101/07/2023 [facility]  Attempts:  2nd Attempt  Reason for unsuccessful TCM follow-up call:  Voice mail full  Bryler Dibble [facility]  [facility], [facility] Resource Care Guide Direct Dial: [phone]  Website: [email or web address]

## 2023-10-09 ENCOUNTER — Other Ambulatory Visit: Payer: Self-pay | Admitting: Nurse Practitioner

## 2023-10-13 ENCOUNTER — Other Ambulatory Visit (INDEPENDENT_AMBULATORY_CARE_PROVIDER_SITE_OTHER): Payer: BC Managed Care – PPO

## 2023-10-13 ENCOUNTER — Encounter: Payer: Self-pay | Admitting: Gastroenterology

## 2023-10-13 ENCOUNTER — Ambulatory Visit (INDEPENDENT_AMBULATORY_CARE_PROVIDER_SITE_OTHER): Payer: BC Managed Care – PPO

## 2023-10-13 ENCOUNTER — Other Ambulatory Visit: Payer: BC Managed Care – PPO

## 2023-10-13 DIAGNOSIS — Z7901 Long term (current) use of anticoagulants: Secondary | ICD-10-CM | POA: Diagnosis not present

## 2023-10-13 DIAGNOSIS — E876 Hypokalemia: Secondary | ICD-10-CM | POA: Diagnosis not present

## 2023-10-13 LAB — COMPREHENSIVE METABOLIC PANEL
ALT: 29 U/L (ref 0–53)
AST: 28 U/L (ref 0–37)
Albumin: 3.7 g/dL (ref 3.5–5.2)
Alkaline Phosphatase: 54 U/L (ref 39–117)
BUN: 16 mg/dL (ref 6–23)
CO2: 26 meq/L (ref 19–32)
Calcium: 9.6 mg/dL (ref 8.4–10.5)
Chloride: 102 meq/L (ref 96–112)
Creatinine, Ser: 1.03 mg/dL (ref 0.40–1.50)
GFR: 84.09 mL/min (ref >60.00)
Glucose, Bld: 126 mg/dL — ABNORMAL HIGH (ref 70–99)
Potassium: 4.3 meq/L (ref 3.5–5.1)
Sodium: 135 meq/L (ref 135–145)
Total Bilirubin: 0.6 mg/dL (ref 0.2–1.2)
Total Protein: 8.3 g/dL (ref 6.0–8.3)

## 2023-10-13 LAB — POCT INR: INR: 2.5 (ref 2.0–3.0)

## 2023-10-13 NOTE — Patient Instructions (Addendum)
Pre visit review using our clinic review tool, if applicable. No additional management support is needed unless otherwise documented below in the visit note.  Continue 2 tablets daily except take 3 tablets on Mondays. Recheck in 5 weeks.

## 2023-10-13 NOTE — Progress Notes (Signed)
I have reviewed the patient's encounter and agree with the documentation.  Wesley Hancock. [name], MD 10/13/2023 9:06 AM

## 2023-10-13 NOTE — Progress Notes (Signed)
Prescribed Augmentin on 11/5 x 10 days. INR checked at [facility] was 1.9 and pt was advised to f/u with coumadin clinic. Advised pt on 11/6 to take 15 mg that day and then return to normal dose.  Continue 2 tablets daily except take 3 tablets on Mondays. Recheck in 5 weeks.

## 2023-10-14 NOTE — Progress Notes (Signed)
Good news! Potassium back to normal.  Wesley Hancock [name], MD 10/14/2023 12:35 PM

## 2023-10-20 ENCOUNTER — Ambulatory Visit: Payer: BC Managed Care – PPO

## 2023-10-20 ENCOUNTER — Ambulatory Visit: Payer: BC Managed Care – PPO | Admitting: Gastroenterology

## 2023-10-20 ENCOUNTER — Encounter: Payer: Self-pay | Admitting: Gastroenterology

## 2023-10-20 VITALS — BP 118/86 | HR 80 | Ht 74.0 in | Wt 294.4 lb

## 2023-10-20 DIAGNOSIS — K219 Gastro-esophageal reflux disease without esophagitis: Secondary | ICD-10-CM

## 2023-10-20 DIAGNOSIS — K5732 Diverticulitis of large intestine without perforation or abscess without bleeding: Secondary | ICD-10-CM | POA: Diagnosis not present

## 2023-10-20 DIAGNOSIS — Z7901 Long term (current) use of anticoagulants: Secondary | ICD-10-CM | POA: Diagnosis not present

## 2023-10-20 DIAGNOSIS — K529 Noninfective gastroenteritis and colitis, unspecified: Secondary | ICD-10-CM | POA: Diagnosis not present

## 2023-10-20 MED ORDER — NA SULFATE-K SULFATE-MG SULF 17.5-3.13-1.6 GM/177ML PO SOLN: 1.0000 | Freq: Once | ORAL | 0 refills | Status: AC

## 2023-10-20 NOTE — Progress Notes (Addendum)
10/20/2023 Wesley Hancock [id number] February 01, 1971   HISTORY OF PRESENT ILLNESS: This is a 52 year old male who is a patient Dr. Irving Burton.  He was last seen here in August 2021 and scheduled for EGD and colonoscopy, but he tells me that he lost his job and his insurance.  He did not see doctors for a few years and now is trying to get back into his regular appointments again, etc.  He tells me that he had a few colonoscopies several years ago in [place].  Most recently he has had some issues with diverticulitis.  He had an episode confirmed on CT scan in October 2023.  Then he had another episode September 07, 2023 seen on CT scan as acute uncomplicated diverticulitis in the proximal sigmoid colon without perforation or abscess.  He was treated with Augmentin for 10 days.  He said that after he came off the antibiotics his symptoms returned so was treated with another 10 days of Augmentin.  He just completed that a week ago.  Still having some intermittent lower abdominal discomfort.  Sometimes not very painful at all, sometimes reaching a 5 or 6 out of 10.  Reports chronic diarrhea/loose stool and it looks like some stool studies were performed when he saw Dr. Myrtie Neither back in 2021 and then plan was for colonoscopy.  He uses Imodium often to try to manage that.  He describes it as bright yellow in color.  He does not have a gallbladder.  Also has a history of acid reflux and [place] indicated "nutcracker esophagus".  Testing and treatment at [facility] previous to being seen here showed a normal esophageal manometry.  pH study had been done but no results were available.  He is on omeprazole 20 mg twice daily currently.  Not having a lot of issues with his reflux currently.  Referred back here again on this occasion by his PCP, Dr. [name], for diverticulosis/diverticulitis.  Past medical history includes antiphospholipid antibody syndrome for which he takes Coumadin, lupus,  anxiety and depression, DVT/PE related to his clotting disorder, hyperlipidemia.   Past Medical History:  Diagnosis Date   Acute nonintractable headache 09/06/2019   Acute respiratory failure with hypoxia ([name]) 08/30/2019   AKI (acute kidney injury) ([name]) 08/30/2019   Antiphospholipid syndrome ([name]) 10/30/2019   Anxiety    APS (antiphospholipid syndrome) ([name])    Arthritis    Colon polyp    ? hyperplastic   Depression    Diarrhea 05/21/2020   DVT (deep venous thrombosis) ([name])    Dyspnea on exertion 09/08/2017   "Quit smoking" 2015 with onset of symptoms in 2016  Spirometry 09/08/2017  Flat f/v loop  - d/c acei 09/08/2017  - 01/04/2020   Walked RA x two laps =  approx 535ft @ fast pace - stopped due to end of study/ min sob with sats of 93 % at the end of the study. - PFT's  03/08/20   FEV1 3.84 (83 % ) ratio 0..90  p 3 % improvement from saba p ? 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 06/26/20, 03/08/20   Snoring 07/09/2020   Stress fracture    in back, seeing PT   Stroke ([name])    Upper airway cough syndrome 01/05/2020   Onset mid Jan 2021 while on otc  PPI  - max rx for gerd 01/04/2020 >>>      Witnessed apneic spells 07/09/2020   Past Surgical History:  Procedure Laterality Date   BRONCHIAL WASHINGS  01/14/2021   Procedure: BRONCHIAL WASHINGS;  Surgeon: Martina Sinner, MD;  Location: [facility];  Service: Pulmonary;;   CHOLECYSTECTOMY     VIDEO BRONCHOSCOPY N/A 01/14/2021   Procedure: VIDEO BRONCHOSCOPY WITHOUT FLUORO;  Surgeon: Martina Sinner, MD;  Location: [facility];  Service: Pulmonary;  Laterality: N/A;    reports that he quit smoking about 9 years ago. His smoking use included cigarettes. He started smoking about 29 years ago. He has a 20 pack-year smoking history. He has been exposed to tobacco smoke. He quit smokeless tobacco use about 5 years ago.  His smokeless tobacco use included snuff. He reports current alcohol use. He reports that he does not use drugs. family history includes Asthma in his mother; Colon cancer in his maternal grandmother; Colon polyps in his father; Diabetes in his maternal grandmother, mother, and sister; Diverticulitis in his father; Healthy in his daughter, daughter, and son; Heart disease in his maternal grandfather, maternal grandmother, and paternal grandmother; Heart disease (age of onset: 2) in his father; Hyperlipidemia in his father, maternal grandfather, maternal grandmother, mother, and sister; Hypertension in his father, maternal grandfather, maternal grandmother, mother, and sister; Irritable bowel syndrome in his father and maternal grandfather; Liver cancer in his maternal grandfather; Lung cancer (age of onset: 31) in his father; Prostate cancer (age of onset: 56) in his father; Stroke in his maternal grandfather; Thyroid disease in his mother. Allergies  Allergen Reactions   Diltiazem Hcl Diarrhea and Other (See Comments)    Lethargic       Outpatient Encounter Medications as of 10/20/2023  Medication Sig   acetaminophen (TYLENOL) 500 MG tablet Take 1,000 mg by mouth every 6 (six)  hours as needed for mild pain.   albuterol (VENTOLIN HFA) 108 (90 Base) MCG/ACT inhaler Inhale 2 puffs into the lungs every 6 (six) hours as needed for wheezing or shortness of breath.   buPROPion (WELLBUTRIN) 75 MG tablet TAKE 1 TABLET BY MOUTH TWICE A DAY   escitalopram (LEXAPRO) 20 MG tablet TAKE 1 TABLET BY MOUTH EVERY DAY   fluticasone (FLONASE) 50 MCG/ACT nasal spray Place 1 spray into both nostrils daily as needed for allergies.   hydrochlorothiazide (HYDRODIURIL) 25 MG tablet Take 1 tablet (25 mg total) by mouth daily.   Multiple Vitamin (MULTIVITAMIN) tablet Take by mouth daily. 2 chews daily   mycophenolate (CELLCEPT) 500 MG tablet TAKE 2 TABLETS BY MOUTH TWICE DAILY   omeprazole (PRILOSEC) 20 MG capsule Take 20 mg by mouth 2 (two) times daily before a meal.   potassium chloride SA (KLOR-CON M) 20 MEQ tablet  Take 1 tablet (20 mEq total) by mouth daily.   telmisartan (MICARDIS) 80 MG tablet TAKE 1 TABLET BY MOUTH EVERY DAY   triamcinolone cream (KENALOG) 0.1 % Apply to affected area 1-2 times daily (Patient taking differently: as needed. Apply to affected area 1-2 times daily)   warfarin (COUMADIN) 5 MG tablet TAKE 2 TABLETS BY MOUTH DAILY EXCEPT TAKE 3 TABLETS ON MONDAYS OR AS DIRECTED BY ANTICOAGULATION CLINIC   nitroGLYCERIN (NITROSTAT) 0.4 MG SL tablet Place 1 tablet (0.4 mg total) under the tongue every 5 (five) minutes as needed for chest pain.   rosuvastatin (CRESTOR) 20 MG tablet Take 1 tablet (20 mg total) by mouth daily.   [DISCONTINUED] amoxicillin-clavulanate (AUGMENTIN) 875-125 MG tablet Take 1 tablet by mouth 2 (two) times daily.   [DISCONTINUED] Evolocumab (REPATHA SURECLICK) 140 MG/ML SOAJ Inject 140 mg into the skin every 14 (fourteen) days.   No facility-administered encounter medications on file as of 10/20/2023.   REVIEW OF SYSTEMS  : All other systems reviewed and negative except where noted in the History of Present Illness.   PHYSICAL EXAM: BP 118/86   Pulse  80   Ht 6\' 2"  (1.88 m)   Wt 294 lb 6.4 oz (133.5 kg)   BMI 37.80 kg/m  General: Well developed white male in no acute distress Head: Normocephalic and atraumatic Eyes:  Sclerae anicteric, conjunctiva pink. Ears: Normal auditory acuity Lungs: Clear throughout to auscultation; no W/R/R. Heart: Regular rate and rhythm; no M/R/G. Abdomen: Soft, non-distended.  Mild lower abdominal TTP. Rectal:  Will be done at the time of colonoscopy. Musculoskeletal: Symmetrical with no gross deformities  Skin: No lesions on visible extremities Neurological: Alert oriented x 4, grossly non-focal Psychological:  Alert and cooperative. Normal mood and affect  ASSESSMENT AND PLAN: *Diverticulitis: Seen on CT scan at the beginning of October.  Treated with Augmentin x 10 days on 2 separate occasions recently, just completed last treatment a week ago.  Still with a little bit of lower discomfort, but definitely improved. *Chronic diarrhea: Stool studies previously negative in 2021.  He was supposed to have colonoscopy at that time, but he lost his insurance.  Will plan for colonoscopy with Dr. Myrtie Neither.  We will push this out a handful of weeks since he just completed treatment for diverticulitis 1 week ago.  In the interim if diverticulitis symptoms return/worsen then he needs to make [place] aware.  May need to consider repeat CT scan.  His chronic diarrhea may also be bile salt related since he does not have a gallbladder.  He describes it to be bright yellow in color.  He is using Imodium as needed, which he can continue but we could consider Questran as well. *GERD: Has chronic GERD as well and is on omeprazole 20 mg twice daily.  Was supposed to have EGD back in 2021 as well.  Will schedule with Dr. Myrtie Neither.  These symptoms are not as predominant at this point. *Chronic anticoagulation with coumadin due to antiphospholipid antibody syndrome:  Will hold coumadin for 5 days prior to endoscopic procedures - will instruct when  and how to resume after procedure. Benefits and risks of procedure explained including risks of bleeding, perforation, infection, missed lesions, reactions to medications and possible need for hospitalization and surgery for complications. Additional rare but real risk of stroke or other vascular clotting events off of Coumadin also explained and need to seek urgent help if any signs of these problems occur. Will communicate by phone or EMR  with patient's prescribing provider, Dr. [name], to confirm that holding Coumadin is reasonable in this case.     CC:  Ardith Dark, MD

## 2023-10-20 NOTE — Patient Instructions (Signed)
You have been scheduled for an endoscopy and colonoscopy. Please follow the written instructions given to you at your visit today.  Please pick up your prep supplies at the pharmacy within the next 1-3 days.  If you use inhalers (even only as needed), please bring them with you on the day of your procedure.  DO NOT TAKE 7 DAYS PRIOR TO TEST- Trulicity (dulaglutide) Ozempic, Wegovy (semaglutide) Mounjaro (tirzepatide) Bydureon Bcise (exanatide extended release)  DO NOT TAKE 1 DAY PRIOR TO YOUR TEST Rybelsus (semaglutide) Adlyxin (lixisenatide) Victoza (liraglutide) Byetta (exanatide) ___________________________________________________________________  _______________________________________________________  If your blood pressure at your visit was 140/90 or greater, please contact your primary care physician to follow up on this.  _______________________________________________________  If you are age 51 or older, your body mass index should be between 23-30. Your Body mass index is 37.8 kg/m. If this is out of the aforementioned range listed, please consider follow up with your Primary Care Provider.  If you are age 72 or younger, your body mass index should be between 19-25. Your Body mass index is 37.8 kg/m. If this is out of the aformentioned range listed, please consider follow up with your Primary Care Provider.   ________________________________________________________  The [facility] providers would like to encourage you to use [facility] to communicate with providers for non-urgent requests or questions.  Due to long hold times on the telephone, sending your provider a message by [facility] may be a faster and more efficient way to get a response.  Please allow 48 business hours for a response.  Please remember that this is for non-urgent requests.  _______________________________________________________

## 2023-10-22 NOTE — Progress Notes (Signed)
____________________________________________________________  Attending physician addendum:  Thank you for sending this case to me. I have reviewed the entire note and agree with the plan.   Malyssa Maris Danis, MD  ____________________________________________________________  

## 2023-10-22 NOTE — Telephone Encounter (Signed)
Found completed FMLA form in patient's chart - signed by Dr. Corliss Skains on 09/30/2023.   There is no longer a need for Dr. Francine Graven to complete this form.

## 2023-10-28 ENCOUNTER — Other Ambulatory Visit: Payer: Self-pay | Admitting: Family Medicine

## 2023-11-02 ENCOUNTER — Other Ambulatory Visit (HOSPITAL_COMMUNITY): Payer: BC Managed Care – PPO | Attending: Medical Genetics

## 2023-11-10 ENCOUNTER — Ambulatory Visit: Payer: BC Managed Care – PPO | Admitting: Gastroenterology

## 2023-11-17 ENCOUNTER — Telehealth: Payer: Self-pay

## 2023-11-17 ENCOUNTER — Ambulatory Visit: Payer: BC Managed Care – PPO

## 2023-11-17 DIAGNOSIS — Z7901 Long term (current) use of anticoagulants: Secondary | ICD-10-CM | POA: Diagnosis not present

## 2023-11-17 LAB — POCT INR: INR: 2.7 (ref 2.0–3.0)

## 2023-11-17 NOTE — Patient Instructions (Addendum)
Pre visit review using our clinic review tool, if applicable. No additional management support is needed unless otherwise documented below in the visit note.  Continue 2 tablets daily except take 3 tablets on Mondays. Recheck in 3 weeks.

## 2023-11-17 NOTE — Progress Notes (Signed)
Colonoscopy scheduled for 1/13. Recommend a Lovenox bridge due to diagnosis of APS and hx of CVA, DVT and PE. GI is asking for 5 day hold. Advised pt this nurse will f/u with him concerning Lovenox bridge and will discuss with PCP.  Continue 2 tablets daily except take 3 tablets on Mondays. Recheck in 3 weeks.     Sent msg to PCP with Lovenox bridge recommendation.

## 2023-11-17 NOTE — Telephone Encounter (Signed)
thanks

## 2023-11-17 NOTE — Progress Notes (Signed)
I have reviewed the patient's encounter and agree with the documentation.  Katina Degree. [name], MD 11/17/2023 8:56 AM

## 2023-11-17 NOTE — Telephone Encounter (Signed)
Agree with bridging plan.  Thanks  [employer]. [name], MD 11/17/2023 10:47 AM

## 2023-11-17 NOTE — Telephone Encounter (Signed)
Pt in coumadin clinic today for INR check. He reports he has been scheduled for a colonoscopy for 12/13/23. Advised pt the warfarin hold and if a lovenox bridge is needed will be discussed with PCP. Pt has f/u coumadin clinic apt for 1/8 to receive dosing instructions around the surgery.  Diagnoses for anticoagulation; APS, hx of DVT, PE and CVA (02/2021). Pt had CVA while taking Lovenox, which he preferred over warfarin at that time.   Recommend Lovenox bridge for colonscopy, 12/13/23. Actual wt: 133.5 kg Ideal wt: 82 kg (>163%) Adjusted wt: 103 kg CrCl: using adjusted wt per protocol is 123.61 mL/min  Recommend Lovenox bridge for surgery using 120 mg syringes Q12H.  Current warfarin dosing; 10 mg (2 tablets) daily except take 15 mg (3 tablets) on Mondays  Recommended dosing schedule:  1/8: Take last dose of warfarin 1/9: NO warfarin, NO Lovenox 1/10: NO warfarin, Lovenox every 12 hours 1/11: NO warfarin, Lovenox every 12 hours 1/12: NO warfarin, Lovenox AM ONLY (BEFORE 7AM)  1/13: SURGERY; NO WARFARIN, NO LOVENOX  1/14: Take 3 tablets (15 mg) warfarin, Lovenox every 12 hours 1/15:Take 3 tablets (15 mg) warfarin, Lovenox every 12 hours 1/16: Take 3 tablets (15 mg) warfarin, Lovenox every 12 hours 1/17: Take 3 tablets (15 mg) warfarin, Lovenox every 12 hours 1/18: Take 2 tablets (10 mg) warfarin, Lovenox every 12 hours 1/19: Take 2 tablets (10 mg) warfarin, Lovenox every 12 hours 1/20: Recheck INR at [facility]; no warfarin and no Lovenox until after INR check

## 2023-11-18 ENCOUNTER — Ambulatory Visit: Payer: BC Managed Care – PPO | Admitting: Pulmonary Disease

## 2023-11-18 ENCOUNTER — Encounter: Payer: Self-pay | Admitting: Pulmonary Disease

## 2023-11-18 VITALS — BP 126/78 | HR 83 | Temp 98.7°F | Ht 74.0 in | Wt 305.8 lb

## 2023-11-18 DIAGNOSIS — M351 Other overlap syndromes: Secondary | ICD-10-CM | POA: Diagnosis not present

## 2023-11-18 DIAGNOSIS — J849 Interstitial pulmonary disease, unspecified: Secondary | ICD-10-CM | POA: Diagnosis not present

## 2023-11-18 DIAGNOSIS — D6861 Antiphospholipid syndrome: Secondary | ICD-10-CM

## 2023-11-18 NOTE — Patient Instructions (Signed)
Continue CellCept twice daily  We will check a high resolution CT Chest scan and pulmonary function tests  Follow up in 2 months with pulmonary function tests

## 2023-11-18 NOTE — Progress Notes (Signed)
Synopsis: Pulmonary follow up for ILD diagnosis in 12/2020  Subjective:   PATIENT ID: Wesley Hancock GENDER: male DOB: 1971-02-17, MRN: [id number]  HPI  Chief Complaint  Patient presents with   Follow-up    C/o cold? Started 2-3 days ago, daughter was sick, wheezing at night, cough in am, nasal congestion, no fever   Wesley Hancock is a 52 year old male, former smoker with history of DVT/PE due to antiphospholipid syndrome diagnosed in September 2020 and later with SLE and mixed connective tissue disease with interstitial lung disease features in February 2022.   OV 04/22/21 He was started on steroid taper in February, initially on 60mg  daily with bactrim prophylaxis. He was tapered to 40mg  daily on 01/30/21 and started on mycophenolate 500mg  twice daily at that time. The mycophenolate was increased to 1g twice daily on 03/26/21. He has tolerated the mycophenolate well and his lab values remain in normal range. He is currently on 30mg  of prednisone daily and bactrim prophylaxis. He reports noticing some increased shortness of breath over the past 3-4 days. Denies fevers or chills.   He received his CPAP machine at the beginning of the month and has noticed improvement in his day time fatigue. He has been gaining weight since being on the steroid taper.  He is followed by Dr. Corliss Skains in rheumatology and Emeline Gins, NP in hematology. I have reviewed there notes from 5/19 and 5/20 respectively.  OV 05/27/21 He continues on 1g cellcept twice daily along with hydroxychloroquine for the mixed connective tissue disease and lupus. He has tapered down to 10mg  of prednisone daily without any increase in dyspnea. He has noticed an increase in intermittent back pain though. He had an episode of acute dyspnea over the last weekend with SpO2 into the low 90s. He rested for a couple days and then the episode resolved spontaneously. He denies any cough or wheezing. No skin rashes.  Today  11/18/23 Patient was last seen in our office 05/14/22 by Rhunette Croft, NP. He was seen 09/28/23 by Dr. Corliss Skains. He had been getting refills on cellcept from his primary care. He lost his health insurance which explains not being seen in our clinic over the past year plus.   He has been compliant with his medication regimen, including CellCept and Coumadin. He has been using a CPAP machine regularly for sleep apnea and reports that he struggles to fall asleep without it. Based on his app data, he is using CPAP 4 hours or more every night and has an AHI of 2 or less. He recently got a new nasal mask which has a good seal. The patient only has respiratory issues when he has a cold. These issues include wheezing and a feeling of gurgling in his chest. The patient has been managing these symptoms with albuterol and antibiotics prescribed by his primary care doctor. Otherwise he denies dry cough or progressive exertional dyspnea. The patient has also been dealing with weight issues and has been on tirzepatide, which helped him lose weight. However, he has gained some weight back after stopping the medication.  Past Medical History:  Diagnosis Date   Acute nonintractable headache 09/06/2019   Acute respiratory failure with hypoxia ([name]) 08/30/2019   AKI (acute kidney injury) ([name]) 08/30/2019   Antiphospholipid syndrome ([name]) 10/30/2019   Anxiety    APS (antiphospholipid syndrome) ([name])    Arthritis    Colon polyp    ? hyperplastic   Depression    Diarrhea 05/21/2020  DVT (deep venous thrombosis) ([name])    Dyspnea on exertion 09/08/2017   "Quit smoking" 2015 with onset of symptoms in 2016  Spirometry 09/08/2017  Flat f/v loop  - d/c acei 09/08/2017  - 01/04/2020   Walked RA x two laps =  approx 513ft @ fast pace - stopped due to end of study/ min sob with sats of 93 % at the end of the study. - PFT's  03/08/20   FEV1 3.84 (83 % ) ratio 0..90  p 3 % improvement from saba p ? 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 06/26/20, 03/08/20   Snoring 07/09/2020   Stress fracture    in back, seeing PT   Stroke ([name])    Upper airway cough syndrome 01/05/2020   Onset mid Jan 2021 while on otc PPI  - max rx for gerd 01/04/2020 >>>      Witnessed apneic spells 07/09/2020     Family History  Problem Relation Age of Onset   Diabetes Mother    Hyperlipidemia Mother    Hypertension Mother    Thyroid disease Mother        uncertain type--had surg--no cancer   Asthma Mother        died from PNA   Heart disease Father 19   Hyperlipidemia Father    Hypertension Father    Prostate cancer Father 11   Lung cancer Father 107       Dx 06/18/2017   Colon polyps Father    Irritable bowel syndrome Father    Diverticulitis Father    Diabetes Sister    Hyperlipidemia Sister    Hypertension Sister    Diabetes Maternal Grandmother    Heart disease Maternal Grandmother    Hyperlipidemia Maternal Grandmother    Hypertension Maternal Grandmother    Colon cancer Maternal Grandmother    Heart disease Maternal Grandfather    Hyperlipidemia Maternal Grandfather    Hypertension  Maternal Grandfather    Stroke Maternal Grandfather    Liver cancer Maternal Grandfather    Irritable bowel syndrome Maternal Grandfather    Heart disease Paternal Grandmother    Healthy Daughter    Healthy Daughter    Healthy Son      Social History   Socioeconomic History   Marital status: Married    Spouse name: Lauren   Number of children: 3   Years of education: Not on file   Highest education level: Not on file  Occupational History   Occupation: Truck driver  Tobacco Use   Smoking status: Former    Current packs/day: 0.00  Average packs/day: 1 pack/day for 20.0 years (20.0 ttl pk-yrs)    Types: Cigarettes    Start date: 11/30/1993    Quit date: 11/30/2013    Years since quitting: 9.9    Passive exposure: Past   Smokeless tobacco: Former    Types: Snuff    Quit date: 05/21/2018  Vaping Use   Vaping status: Never Used  Substance and Sexual Activity   Alcohol use: Yes    Comment: occ   Drug use: No   Sexual activity: Yes  Other Topics Concern   Not on file  Social History Narrative   Lives with wife and daughter at home   Right Handed   Drinks about 2 cups caffeine daily   Social Drivers of Health   Financial Resource Strain: Low Risk  (10/04/2022)   Received from [employer] [employer], [facility]   Overall Financial Resource Strain (CARDIA)    Difficulty of Paying Living Expenses: Not hard at all  Food Insecurity: No Food Insecurity (10/04/2022)   Received from [facility], [facility]   Hunger Vital Sign    Worried About Running Out of Food in the Last Year: Never true    Ran Out of Food in the Last Year: Never true  Transportation Needs: Not on file  Physical Activity: Sufficiently Active (10/04/2022)   Received from [facility], [facility]   Exercise Vital Sign    Days of Exercise per Week: 5 days    Minutes of Exercise per Session: 100 min  Stress: No Stress Concern Present (10/04/2022)   Received from [facility], [facility]   [employer] - Occupational Stress Questionnaire    Feeling of Stress : Not at all  Social Connections: Socially Integrated (10/04/2022)   Received from [facility], [facility]   Social Network    How would you rate your social network (family, work, friends)?: Good participation with social networks  Intimate Partner Violence: Not At Risk (10/04/2022)   Received from [employer] [facility], [facility]   HITS    Over the last 12 months how often did your partner physically hurt you?: Never    Over the last 12 months how often did your partner insult you or talk down to you?: Never    Over the last 12 months how often did your partner threaten you with physical harm?: Never    Over the last 12 months how often did your partner scream or curse at you?: Never     Allergies  Allergen Reactions   Diltiazem Hcl Diarrhea and Other (See Comments)    Lethargic      Outpatient Medications Prior to Visit  Medication Sig Dispense Refill   acetaminophen (TYLENOL) 500 MG tablet Take 1,000 mg by mouth every 6 (six) hours as needed for mild pain.     albuterol (VENTOLIN HFA) 108 (90 Base) MCG/ACT inhaler Inhale 2 puffs into the lungs every 6 (six) hours as needed for wheezing or shortness of breath. 8 g 6   buPROPion (WELLBUTRIN) 75 MG tablet TAKE 1 TABLET BY MOUTH TWICE A DAY 180 tablet 1   escitalopram (LEXAPRO) 20 MG tablet TAKE 1 TABLET BY MOUTH EVERY DAY 90 tablet 1   fluticasone (FLONASE) 50 MCG/ACT nasal spray Place 1 spray into both nostrils daily as needed for allergies.     hydrochlorothiazide (HYDRODIURIL) 25 MG tablet Take 1 tablet (25 mg total) by mouth daily. 90 tablet 2   Multiple Vitamin (MULTIVITAMIN) tablet Take by  mouth daily. 2 chews daily     mycophenolate (CELLCEPT) 500 MG tablet TAKE 2 TABLETS BY MOUTH TWICE DAILY 360 tablet 1   omeprazole (PRILOSEC) 20 MG capsule Take 20 mg by mouth 2 (two) times daily before a meal.     telmisartan (MICARDIS) 80 MG tablet  TAKE 1 TABLET BY MOUTH EVERY DAY 90 tablet 2   triamcinolone cream (KENALOG) 0.1 % Apply to affected area 1-2 times daily (Patient taking differently: as needed. Apply to affected area 1-2 times daily) 30 g 0   warfarin (COUMADIN) 5 MG tablet TAKE 2 TABLETS BY MOUTH DAILY EXCEPT TAKE 3 TABLETS ON MONDAYS OR AS DIRECTED BY ANTICOAGULATION CLINIC 200 tablet 1   KLOR-CON M20 20 MEQ tablet TAKE 1 TABLET BY MOUTH EVERY DAY (Patient not taking: Reported on 11/18/2023) 90 tablet 1   nitroGLYCERIN (NITROSTAT) 0.4 MG SL tablet Place 1 tablet (0.4 mg total) under the tongue every 5 (five) minutes as needed for chest pain. 90 tablet 3   rosuvastatin (CRESTOR) 20 MG tablet Take 1 tablet (20 mg total) by mouth daily. 90 tablet 1   No facility-administered medications prior to visit.    Review of Systems  Constitutional:  Negative for chills, fever, malaise/fatigue and weight loss.  HENT:  Negative for congestion, sinus pain and sore throat.   Eyes: Negative.   Respiratory:  Negative for cough, hemoptysis, sputum production, shortness of breath and wheezing.   Cardiovascular:  Negative for chest pain, palpitations, orthopnea, claudication and leg swelling.  Gastrointestinal:  Negative for abdominal pain, heartburn, nausea and vomiting.  Genitourinary: Negative.   Musculoskeletal:  Negative for joint pain and myalgias.  Skin:  Negative for rash.  Neurological:  Negative for weakness.  Endo/Heme/Allergies: Negative.   Psychiatric/Behavioral: Negative.      Objective:   Vitals:   11/18/23 0834 11/18/23 0836  BP: 126/78   Pulse: 83   Temp:  98.7 F (37.1 C)  TempSrc:  Temporal  SpO2: 98%   Weight:  (!) 305 lb 12.8 oz (138.7 kg)  Height:  6\' 2"  (1.88 m)     Physical Exam Constitutional:      General: He is not in acute distress. HENT:     Head: Normocephalic and atraumatic.  Eyes:     Conjunctiva/sclera: Conjunctivae normal.  Cardiovascular:     Rate and Rhythm: Normal rate and regular  rhythm.     Pulses: Normal pulses.     Heart sounds: Normal heart sounds. No murmur heard. Pulmonary:     Breath sounds: No rales.  Musculoskeletal:     Right lower leg: No edema.     Left lower leg: No edema.  Skin:    General: Skin is warm and dry.  Neurological:     General: No focal deficit present.     Mental Status: He is alert.    CBC    Component Value Date/Time   WBC 6.5 10/05/2023 1341   RBC 4.82 10/05/2023 1341   HGB 14.2 10/05/2023 1341   HGB 14.6 04/18/2021 0817   HCT 42.4 10/05/2023 1341   PLT 273.0 10/05/2023 1341   PLT 254 04/18/2021 0817   MCV 88.0 10/05/2023 1341   MCH 30.3 09/07/2023 1240   MCHC 33.4 10/05/2023 1341   RDW 13.5 10/05/2023 1341   LYMPHSABS 1.9 10/05/2023 1341   MONOABS 0.5 10/05/2023 1341   EOSABS 0.3 10/05/2023 1341   BASOSABS 0.1 10/05/2023 1341      Latest Ref Rng & Units 10/13/2023  9:21 AM 10/05/2023    1:41 PM 09/14/2023    9:40 AM  BMP  Glucose 70 - 99 mg/dL 295  621  97   BUN 6 - 23 mg/dL 16  12  17    Creatinine 0.40 - 1.50 mg/dL 3.08  6.57  8.46   Sodium 135 - 145 mEq/L 135  135  134   Potassium 3.5 - 5.1 mEq/L 4.3  3.2  4.5   Chloride 96 - 112 mEq/L 102  99  100   CO2 19 - 32 mEq/L 26  29  25    Calcium 8.4 - 10.5 mg/dL 9.6  9.3  9.8    Labs: 01/13/2021 ANA Positive dsDNA 177 IU/mL (0-9IU/mL) Ribnucleic Protien 2.4 (0- 0.9AI)  01/15/21 MyoMarker 3 Panel is negative  Chest imaging: CT Abd 09/07/23 Lower chest: Patchy and geographic ground-glass opacities in both lung bases. This is more diffuse than on prior exam.  CTA Chest 01/13/21 1. No demonstrable pulmonary embolus. No thoracic aortic aneurysm or dissection. There are foci of aortic atherosclerosis as well as foci of coronary artery calcification.   2. Extensive airspace opacity bilaterally with overall increased compared to December 2021. Areas of patchy consolidation noted in several areas. The overall appearance is most indicative of  atypical organism pneumonia. A degree of bacterial superinfection is question. Note that there has been persistence of multifocal airspace opacity for several months. This circumstance may warrant Pulmonary Medicine consultation with consideration for bronchoscopy for further assessment with respect to etiology for the parenchymal lung lesions.   3. Stable adenopathy of uncertain etiology. Given the parenchymal lung changes, this adenopathy could have reactive etiology.   4. Hepatic steatosis. Liver appears prominent although incompletely visualized. Gallbladder absent.   5. Thyroid nodules with assessment by thyroid ultrasound 6 days prior. Please see recent thyroid ultrasound report with respect to thyroid nodular assessment.  PFT:    Latest Ref Rng & Units 03/25/2021    8:59 AM 03/08/2020    2:51 PM  PFT Results  FVC-Pre L 4.17  4.31   FVC-Predicted Pre % 70  72   FVC-Post L 4.02  4.29   FVC-Predicted Post % 68  72   Pre FEV1/FVC % % 88  86   Post FEV1/FCV % % 91  90   FEV1-Pre L 3.66  3.71   FEV1-Predicted Pre % 79  80   FEV1-Post L 3.67  3.84   DLCO uncorrected ml/min/mmHg 26.04  26.46   DLCO UNC% % 76  77   DLCO corrected ml/min/mmHg 25.42  26.46   DLCO COR %Predicted % 74  77   DLVA Predicted % 96  99   TLC L 6.11  5.98   TLC % Predicted % 77  76   RV % Predicted % 74  67   03/25/21 mild restrictive defect and mild diffusion defect  Echo 07/31/20:  1. Left ventricular ejection fraction, by estimation, is 60 to 65%. The  left ventricle has normal function. The left ventricle has no regional  wall motion abnormalities. There is mild left ventricular hypertrophy.  Left ventricular diastolic parameters  were normal.   2. Right ventricular systolic function is normal. The right ventricular  size is normal.   3. Left atrial size was mildly dilated.   4. The mitral valve is normal in structure. No evidence of mitral valve  regurgitation. No evidence of mitral stenosis.    5. The aortic valve is normal in structure. Aortic valve regurgitation is  not visualized. No aortic stenosis is present.   6. The inferior vena cava is normal in size with greater than 50%  respiratory variability, suggesting right atrial pressure of 3 mmHg.  NM Myocardial Stress Test 08/01/20 The left ventricular ejection fraction is normal (55-65%). Nuclear stress EF: 59%. There were no wall motion abnormalities There was no ST segment deviation noted during stress. The study is normal. This is a low risk study. No perfusion defects.  Assessment & Plan:   ILD (interstitial lung disease) ([name]) - Plan: CT CHEST HIGH RESOLUTION, Pulmonary Function Test  Mixed connective tissue disease ([name])  APS (antiphospholipid syndrome) ([name])  Discussion: Wesley Hancock is a 51 year old male, former smoker with history of DVT/PE due to antiphospholipid syndrome diagnosed in September 2020 who presented with shortness of breath and chest pain 01/13/21 with workup showing signs of interstitial lung disease related to lupus/mixed connective tissue overlap syndrome.   Interstitial Lung Disease (ILD) secondary to Mixed Connective Tissue Disease Stable on CellCept. Noted a period of insurance loss but remained on medication. Recent CT abdomen showed scattered diffuse areas of inflammation in lower lungs. -Order dedicated CT chest to assess for progression of ILD. -Order pulmonary function tests (PFTs) to assess lung function. -Consider increasing CellCept dosing if progression noted on CT or PFTs. -Consider adding antifibrotic medication if scarring noted on CT. -Followed by Rheumatology for mixed connective tissue disease  Obstructive Sleep Apnea (OSA) Compliant with CPAP use, with good control of events per hour. Noted some wheezing with colds. -Continue CPAP use as currently prescribed. -Consider home testing unit for Coumadin monitoring.  Antiphospholipid syndrome with hx of DVT/PE Stable  on Coumadin, with recent INR at 2.7. Regularly monitored by primary care. -Continue current regimen.  Follow-up in 2 months with updated testing.   Melody Comas, MD [facility] Office: [phone]   Current Outpatient Medications:    acetaminophen (TYLENOL) 500 MG tablet, Take 1,000 mg by mouth every 6 (six) hours as needed for mild pain., Disp: , Rfl:    albuterol (VENTOLIN HFA) 108 (90 Base) MCG/ACT inhaler, Inhale 2 puffs into the lungs every 6 (six) hours as needed for wheezing or shortness of breath., Disp: 8 g, Rfl: 6   buPROPion (WELLBUTRIN) 75 MG tablet, TAKE 1 TABLET BY MOUTH TWICE A DAY, Disp: 180 tablet, Rfl: 1   escitalopram (LEXAPRO) 20 MG tablet, TAKE 1 TABLET BY MOUTH EVERY DAY, Disp: 90 tablet, Rfl: 1   fluticasone (FLONASE) 50 MCG/ACT nasal spray, Place 1 spray into both nostrils daily as needed for allergies., Disp: , Rfl:    hydrochlorothiazide (HYDRODIURIL) 25 MG tablet, Take 1 tablet (25 mg total) by mouth daily., Disp: 90 tablet, Rfl: 2   Multiple Vitamin (MULTIVITAMIN) tablet, Take by mouth daily. 2 chews daily, Disp: , Rfl:    mycophenolate (CELLCEPT) 500 MG tablet, TAKE 2 TABLETS BY MOUTH TWICE DAILY, Disp: 360 tablet, Rfl: 1   omeprazole (PRILOSEC) 20 MG capsule, Take 20 mg by mouth 2 (two) times daily before a meal., Disp: , Rfl:    telmisartan (MICARDIS) 80 MG tablet, TAKE 1 TABLET BY MOUTH EVERY DAY, Disp: 90 tablet, Rfl: 2   triamcinolone cream (KENALOG) 0.1 %, Apply to affected area 1-2 times daily (Patient taking differently: as needed. Apply to affected area 1-2 times daily), Disp: 30 g, Rfl: 0   warfarin (COUMADIN) 5 MG tablet, TAKE 2 TABLETS BY MOUTH DAILY EXCEPT TAKE 3 TABLETS ON MONDAYS OR AS DIRECTED BY ANTICOAGULATION  CLINIC, Disp: 200 tablet, Rfl: 1   KLOR-CON M20 20 MEQ tablet, TAKE 1 TABLET BY MOUTH EVERY DAY (Patient not taking: Reported on 11/18/2023), Disp: 90 tablet, Rfl: 1   nitroGLYCERIN (NITROSTAT) 0.4 MG SL tablet,  Place 1 tablet (0.4 mg total) under the tongue every 5 (five) minutes as needed for chest pain., Disp: 90 tablet, Rfl: 3   rosuvastatin (CRESTOR) 20 MG tablet, Take 1 tablet (20 mg total) by mouth daily., Disp: 90 tablet, Rfl: 1

## 2023-11-25 NOTE — Progress Notes (Signed)
 Office Visit Note  Patient: Wesley Hancock             Date of Birth: 1971/06/14           MRN: [id number]             PCP: Kennyth Worth HERO, MD Referring: Kennyth Worth HERO, MD Visit Date: 12/07/2023 Occupation: @GUAROCC @  Subjective:  Medication monitoring   History of Present Illness: Wesley Hancock is a 52 y.o. male with history of mixed connective tissue disease.  Patient is currently taking cellcept  500 mg 2 tablets by mouth twice daily.  He is tolerating CellCept  without any side effects and has not had any recent gaps in therapy.  He denies any new or worsening symptoms since his last office visit.  Patient was evaluated by Dr. Kara on 11/18/2023 and is scheduled for an updated high-resolution chest CT on 12/15/2023 and pulmonary function testing on 12/20/2023.  No medication changes have been made yet but will be discussed at his upcoming follow-up visit if necessary.   Patient continues to experience intermittent arthralgias and joint stiffness.  The discomfort in his lower back and both hips secondary to trochanteric bursitis are exacerbated by sitting for prolonged periods of time.  He has been performing stretching exercises as recommended.  He denies any joint swelling at this time.   He denies any signs or symptoms of raynaud's phenomenon.  He denies any recent rashes or oral or nasal ulcers.   Patient reports he has been experiencing recurrent bouts of diverticulitis.  He has started to take probiotics.  He is scheduled to update a colonoscopy on 12/13/2023.  Activities of Daily Living:  Patient reports morning stiffness for 25 minutes.   Patient Denies nocturnal pain.  Difficulty dressing/grooming: Denies Difficulty climbing stairs: Denies Difficulty getting out of chair: Denies Difficulty using hands for taps, buttons, cutlery, and/or writing: Denies  Review of Systems  Constitutional:  Positive for fatigue.  HENT:  Negative for mouth sores and mouth dryness.    Eyes:  Negative for dryness.  Respiratory:  Positive for shortness of breath.   Cardiovascular:  Negative for chest pain and palpitations.  Gastrointestinal:  Positive for constipation and diarrhea. Negative for blood in stool.  Endocrine: Positive for increased urination.  Genitourinary:  Negative for involuntary urination.  Musculoskeletal:  Positive for joint pain, joint pain, morning stiffness and muscle tenderness. Negative for gait problem, joint swelling, myalgias, muscle weakness and myalgias.  Skin:  Negative for color change, rash, hair loss and sensitivity to sunlight.  Allergic/Immunologic: Positive for susceptible to infections.  Neurological:  Negative for dizziness and headaches.  Hematological:  Negative for swollen glands.  Psychiatric/Behavioral:  Positive for depressed mood. Negative for sleep disturbance. The patient is nervous/anxious.     PMFS History:  Patient Active Problem List   Diagnosis Date Noted   Diverticulitis of colon 10/20/2023   Diverticulosis 09/14/2023   SLE (systemic lupus erythematosus) ([name]) 05/19/2023   Coronary artery disease 05/19/2023   GERD (gastroesophageal reflux disease) 05/19/2023   Vitamin D deficiency 10/09/2021   ILD (interstitial lung disease) ([name]) 05/27/2021   Chronic anticoagulation 03/26/2021   CVA (cerebral vascular accident) ([name]) 03/20/2021   Prediabetes 01/16/2021   OSA on CPAP 10/11/2020   APS (antiphospholipid syndrome) ([name])    Chronic diarrhea 05/21/2020   Morbid obesity ([name]) 09/10/2019   Depression 06/24/2017   Dyslipidemia 09/18/2015    Past Medical History:  Diagnosis Date   Acute nonintractable headache 09/06/2019   Acute  respiratory failure with hypoxia ([name]) 08/30/2019   AKI (acute kidney injury) ([name]) 08/30/2019   Antiphospholipid syndrome ([name]) 10/30/2019   Anxiety    APS (antiphospholipid syndrome) ([name])    Arthritis    Colon polyp    ? hyperplastic   Depression    Diarrhea 05/21/2020   DVT  (deep venous thrombosis) ([name])    Dyspnea on exertion 09/08/2017   Quit smoking 2015 with onset of symptoms in 2016  Spirometry 09/08/2017  Flat f/v loop  - d/c acei 09/08/2017  - 01/04/2020   Walked RA x two laps =  approx 597ft @ fast pace - stopped due to end of study/ min sob with sats of 93 % at the end of the study. - PFT's  03/08/20   FEV1 3.84 (83 % ) ratio 0..90  p 3 % improvement from saba p ? 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 06/26/20, 03/08/20   Snoring 07/09/2020   Stress fracture    in back, seeing PT   Stroke ([name])    Upper airway cough syndrome 01/05/2020   Onset mid Jan 2021 while on otc PPI  - max rx for gerd 01/04/2020 >>>      Witnessed apneic spells 07/09/2020    Family History  Problem Relation Age of Onset   Diabetes Mother    Hyperlipidemia Mother    Hypertension Mother    Thyroid  disease Mother        uncertain type--had surg--no cancer   Asthma Mother        died from PNA   Heart disease Father 45   Hyperlipidemia Father    Hypertension Father    Prostate cancer Father 27   Lung cancer Father 32       Dx 06/18/2017   Colon  polyps Father    Irritable bowel syndrome Father    Diverticulitis Father    Diabetes Sister    Hyperlipidemia Sister    Hypertension Sister    Diabetes Maternal Grandmother    Heart disease Maternal Grandmother    Hyperlipidemia Maternal Grandmother    Hypertension Maternal Grandmother    Colon cancer Maternal Grandmother    Heart disease Maternal Grandfather    Hyperlipidemia Maternal Grandfather    Hypertension Maternal Grandfather    Stroke Maternal Grandfather    Liver cancer Maternal Grandfather    Irritable bowel syndrome Maternal Grandfather    Heart disease Paternal Grandmother    Healthy Daughter    Healthy Daughter    Healthy Son    Past Surgical History:  Procedure Laterality Date   BRONCHIAL WASHINGS  01/14/2021   Procedure: BRONCHIAL WASHINGS;  Surgeon: Kara Dorn NOVAK, MD;  Location: [facility];  Service: Pulmonary;;   CHOLECYSTECTOMY     VIDEO BRONCHOSCOPY N/A 01/14/2021   Procedure: VIDEO BRONCHOSCOPY WITHOUT FLUORO;  Surgeon: Kara Dorn NOVAK, MD;  Location: [facility];  Service: Pulmonary;  Laterality: N/A;   Social History   Social History Narrative   Lives with wife and daughter at home   Right Handed   Drinks about 2 cups caffeine daily   Immunization History  Administered Date(s) Administered   Fluzone Influenza virus vaccine,trivalent (IIV3), split virus 09/18/2015, 09/14/2016   Influenza Inj Mdck Quad Pf 12/13/2020   Influenza, Seasonal, Injecte, Preservative Fre 09/04/2023   Influenza,inj,Quad PF,6+ Mos 09/14/2016, 09/08/2017, 09/06/2019, 10/07/2021, 09/14/2022   Influenza,inj,quad, With Preservative 09/18/2015   PFIZER(Purple Top)SARS-COV-2 Vaccination 02/19/2020, 03/04/2020, 12/13/2020, 07/03/2021   PNEUMOCOCCAL CONJUGATE-20 04/27/2022   Pneumococcal Polysaccharide-23 09/04/2023   Td 11/11/2016   Tdap 02/28/2019   Zoster Recombinant(Shingrix) 04/27/2022     Objective: Vital Signs: BP 128/84 (BP Location: Left Arm, Patient  Position: Sitting, Cuff Size: Normal)   Pulse 81   Resp 14   Ht 6' 1.5 (1.867 m)   Wt (!) 306 lb (138.8 kg)   BMI 39.82 kg/m    Physical Exam Vitals and nursing note reviewed.  Constitutional:      Appearance: He is well-developed.  HENT:     Head: Normocephalic and atraumatic.  Eyes:     Conjunctiva/sclera: Conjunctivae normal.     Pupils: Pupils are equal, round, and reactive to light.  Cardiovascular:     Rate and Rhythm: Normal rate and regular rhythm.     Heart sounds: Normal heart sounds.  Pulmonary:     Effort: Pulmonary effort is normal.     Breath sounds: Normal breath sounds.  Abdominal:     General: Bowel sounds are normal.     Palpations: Abdomen is soft.  Musculoskeletal:     Cervical back: Normal range of motion and neck supple.  Skin:    General: Skin is warm and dry.     Capillary Refill: Capillary refill takes less than 2 seconds.  Neurological:     Mental Status: He is alert and oriented to person, place, and time.  Psychiatric:        Behavior: Behavior normal.      Musculoskeletal Exam: Cervical spine has good range of motion.  Some discomfort with lumbar range of motion.  Shoulder joints, elbow joints, wrist joints, MCPs, PIPs, DIPs have good range of motion with no synovitis.  PIP and DIP thickening consistent with osteoarthritis of both hands.  Some discomfort with complete fist formation.  Hip joints have good range of motion with no groin pain.  Tenderness over bilateral trochanteric bursa.  Knee joints have good range of motion no warmth or effusion.  Ankle joints have good range of motion with no tenderness or joint swelling.  CDAI Exam: CDAI Score: -- Patient Global: --; Provider Global: -- Swollen: --; Tender: -- Joint Exam 12/07/2023   No joint exam has been documented for this visit   There is currently no information documented on the homunculus. Go to the Rheumatology activity and complete the homunculus joint  exam.  Investigation: No additional findings.  Imaging: No results found.  Recent Labs: Lab Results  Component Value Date   WBC 6.5 10/05/2023   HGB 14.2 10/05/2023   PLT 273.0 10/05/2023   NA 135 10/13/2023   K 4.3 10/13/2023   CL  102 10/13/2023   CO2 26 10/13/2023   GLUCOSE 126 (H) 10/13/2023   BUN 16 10/13/2023   CREATININE 1.03 10/13/2023   BILITOT 0.6 10/13/2023   ALKPHOS 54 10/13/2023   AST 28 10/13/2023   ALT 29 10/13/2023   PROT 8.3 10/13/2023   ALBUMIN 3.7 10/13/2023   CALCIUM  9.6 10/13/2023   GFRAA 84 10/14/2020   QFTBGOLDPLUS Negative 01/15/2021    Speciality Comments: Plaquenil  200 mg p.o. twice daily started March 26, 2021  Procedures:  No procedures performed Allergies: Diltiazem  hcl      Assessment / Plan:     Visit Diagnoses: Mixed connective tissue disease ([name]) - +ANA,+dsDNA,+RNP,+aCL,+LA, +beta-2  GP 1, +aPS. H/o CVA,PE,DVTs,ILD,arthralgias: The patient's symptoms remain overall stable while taking CellCept  500 mg 2 tablets by mouth twice daily.  He continues to tolerate CellCept  without any side effects and has not had any recent interruptions in therapy.  He has no synovitis on examination today.  He has not had any symptoms of Raynaud's phenomenon or recent rashes.  No oral or nasal ulcerations.  No Malar rash noted.  Lab work from 09/28/2023 was reviewed today in the office: ANA negative, SCL 70 negative, Ro antibody negative, Smith antibody negative, La antibody negative, complements within normal limits, RNP 1.9, and dsDNA 76.  CBC updated on 10/05/23 and CMP updated on 10/13/23.   Patient continues to experience intermittent shortness of breath and coughing secondary to ILD.  Patient reestablished care with Dr. Kara on 11/18/2023 and is scheduled to have updated high-resolution chest CT on 12/14/2022 and updated pulmonary function testing on 12/20/2023.  No medication changes were made at his recent office visit but there is consideration of  increasing the dose of CellCept  or adding an antifibrotic agent if there has been any progression in ILD.  He will notify us  if any medication changes are made. He was advised to notify us  if he develops any new or worsening symptoms.  He will follow-up in the office in 3 to 4 months.  ILD (interstitial lung disease) ([name]) - He has intermittent SOB, chest pain, and coughing.   High resolution chest CT scheduled on 12/15/23.   PFTs scheduled on 12/20/23.  Reviewed office visit note with Dr. Kara 11/18/23-may consider increasing the dose of Cellcept  if there is progression of ILD or adding antifibrotic if there is increased scarring.   He will remain on Cellcept  500 mg 2 tablets twice daily for now.    APS (antiphospholipid syndrome) ([name]) - Positive lupus anticoagulant, antiphosphatidylserine, positive anticardiolipin, positive beta-2  GP 1. Hx of DVT, PE, CVA. He is on lifelong coumadin .  High risk medication use - He is taking CellCept  1 g by mouth twice daily as monotherapy. CBC and CMP updated on 10/05/23. He will be due to update lab work in February and every 3 months. Standing orders for CBC and CMP placed today.   Chronic midline low back pain with bilateral sciatica: Patient continues to experience intermittent discomfort and stiffness in his lower back.  His symptoms are exacerbated by sitting for prolonged periods of time.  No symptoms of sciatica at this time.  Pain in both hands: He has intermittent discomfort and stiffness in both hands.  PIP and DIP thickening noted.  He has some discomfort with stiffness involving both hands but no active inflammation was noted.  No synovitis noted.   Trochanteric bursitis of both hips: Persistent discomfort.  Encouraged the patient to perform daily stretching exercises.  Other medical conditions are listed as  follows:   Cerebrovascular accident (CVA), unspecified mechanism ([name]) - He was started on Coumadin  in April 2022 after CVA.  He developed  expressive aphasia.  Deep vein thrombosis (DVT) of right lower extremity, unspecified chronicity, unspecified vein ([name]): He remains on coumadin  as prescribed.   Multiple pulmonary emboli [facility]) - September 2019.  He remains on Coumadin .  Pulmonary infiltrates  Multinodular goiter  Hypertensive heart disease without heart failure  Nutcracker esophagus  Other fatigue  Mixed hyperlipidemia  GAD (generalized anxiety disorder)  Esophageal spasm  OSA on CPAP  Orders: No orders of the defined types were placed in this encounter.  No orders of the defined types were placed in this encounter.    Follow-Up Instructions: Return in about 3 months (around 03/06/2024) for MCTD.   Waddell CHRISTELLA Craze, PA-C  Note - This record has been created using Dragon software.  Chart creation errors have been sought, but may not always  have been located. Such creation errors do not reflect on  the standard of medical care.

## 2023-12-02 ENCOUNTER — Telehealth: Payer: Self-pay | Admitting: *Deleted

## 2023-12-02 NOTE — Telephone Encounter (Signed)
 error

## 2023-12-06 ENCOUNTER — Encounter: Payer: Self-pay | Admitting: Gastroenterology

## 2023-12-06 MED ORDER — ENOXAPARIN SODIUM 120 MG/0.8ML IJ SOSY: 120.0000 mg | PREFILLED_SYRINGE | Freq: Two times a day (BID) | INTRAMUSCULAR | 0 refills | Status: DC

## 2023-12-06 NOTE — Telephone Encounter (Signed)
 Sent in script for Lovenox incase pharmacy needs to order.

## 2023-12-07 ENCOUNTER — Ambulatory Visit: Payer: BC Managed Care – PPO | Attending: Physician Assistant | Admitting: Physician Assistant

## 2023-12-07 ENCOUNTER — Encounter: Payer: Self-pay | Admitting: Physician Assistant

## 2023-12-07 VITALS — BP 128/84 | HR 81 | Resp 14 | Ht 73.5 in | Wt 306.0 lb

## 2023-12-07 DIAGNOSIS — K224 Dyskinesia of esophagus: Secondary | ICD-10-CM

## 2023-12-07 DIAGNOSIS — M351 Other overlap syndromes: Secondary | ICD-10-CM

## 2023-12-07 DIAGNOSIS — J849 Interstitial pulmonary disease, unspecified: Secondary | ICD-10-CM

## 2023-12-07 DIAGNOSIS — I639 Cerebral infarction, unspecified: Secondary | ICD-10-CM

## 2023-12-07 DIAGNOSIS — G4733 Obstructive sleep apnea (adult) (pediatric): Secondary | ICD-10-CM

## 2023-12-07 DIAGNOSIS — M7062 Trochanteric bursitis, left hip: Secondary | ICD-10-CM

## 2023-12-07 DIAGNOSIS — D6861 Antiphospholipid syndrome: Secondary | ICD-10-CM | POA: Diagnosis not present

## 2023-12-07 DIAGNOSIS — Z79899 Other long term (current) drug therapy: Secondary | ICD-10-CM

## 2023-12-07 DIAGNOSIS — E782 Mixed hyperlipidemia: Secondary | ICD-10-CM

## 2023-12-07 DIAGNOSIS — R918 Other nonspecific abnormal finding of lung field: Secondary | ICD-10-CM

## 2023-12-07 DIAGNOSIS — I2699 Other pulmonary embolism without acute cor pulmonale: Secondary | ICD-10-CM

## 2023-12-07 DIAGNOSIS — I119 Hypertensive heart disease without heart failure: Secondary | ICD-10-CM

## 2023-12-07 DIAGNOSIS — I82401 Acute embolism and thrombosis of unspecified deep veins of right lower extremity: Secondary | ICD-10-CM

## 2023-12-07 DIAGNOSIS — M5442 Lumbago with sciatica, left side: Secondary | ICD-10-CM

## 2023-12-07 DIAGNOSIS — M79641 Pain in right hand: Secondary | ICD-10-CM

## 2023-12-07 DIAGNOSIS — M7061 Trochanteric bursitis, right hip: Secondary | ICD-10-CM

## 2023-12-07 DIAGNOSIS — M5441 Lumbago with sciatica, right side: Secondary | ICD-10-CM

## 2023-12-07 DIAGNOSIS — M79642 Pain in left hand: Secondary | ICD-10-CM

## 2023-12-07 DIAGNOSIS — F411 Generalized anxiety disorder: Secondary | ICD-10-CM

## 2023-12-07 DIAGNOSIS — G8929 Other chronic pain: Secondary | ICD-10-CM

## 2023-12-07 DIAGNOSIS — R5383 Other fatigue: Secondary | ICD-10-CM

## 2023-12-07 DIAGNOSIS — E042 Nontoxic multinodular goiter: Secondary | ICD-10-CM

## 2023-12-07 NOTE — Patient Instructions (Signed)
 Standing Labs We placed an order today for your standing lab work.   Please have your standing labs drawn in February and every 3 months   Please have your labs drawn 2 weeks prior to your appointment so that the provider can discuss your lab results at your appointment, if possible.  Please note that you may see your imaging and lab results in MyChart before we have reviewed them. We will contact you once all results are reviewed. Please allow our office up to 72 hours to thoroughly review all of the results before contacting the office for clarification of your results.  WALK-IN LAB HOURS  Monday through Thursday from 8:00 am -12:30 pm and 1:00 pm-5:00 pm and Friday from 8:00 am-12:00 pm.  Patients with office visits requiring labs will be seen before walk-in labs.  You may encounter longer than normal wait times. Please allow additional time. Wait times may be shorter on  Monday and Thursday afternoons.  We do not book appointments for walk-in labs. We appreciate your patience and understanding with our staff.   Labs are drawn by [facility]. Please bring your co-pay at the time of your lab draw.  You may receive a bill from [facility] for your lab work.  Please note if you are on Hydroxychloroquine and and an order has been placed for a Hydroxychloroquine level,  you will need to have it drawn 4 hours or more after your last dose.  If you wish to have your labs drawn at another location, please call the office 24 hours in advance so we can fax the orders.  The office is located at [address], [address], [place], [place] [address]   If you have any questions regarding directions or hours of operation,  please call [phone].   As a reminder, please drink plenty of water prior to coming for your lab work. Thanks!

## 2023-12-08 ENCOUNTER — Ambulatory Visit (INDEPENDENT_AMBULATORY_CARE_PROVIDER_SITE_OTHER): Payer: BC Managed Care – PPO

## 2023-12-08 ENCOUNTER — Other Ambulatory Visit: Payer: Self-pay | Admitting: Physician Assistant

## 2023-12-08 DIAGNOSIS — Z7901 Long term (current) use of anticoagulants: Secondary | ICD-10-CM

## 2023-12-08 LAB — POCT INR: INR: 2.6 (ref 2.0–3.0)

## 2023-12-08 NOTE — Progress Notes (Signed)
 Colonoscopy scheduled for 1/13. Will have a 5 day hold with lovenox  bridge.  Pt reports he has picked up Lovenox .   1/8: Take last dose of warfarin 1/9: NO warfarin, NO Lovenox  1/10: NO warfarin, Lovenox  every 12 hours 1/11: NO warfarin, Lovenox  every 12 hours 1/12: NO warfarin, Lovenox  AM ONLY (BEFORE 7AM)   1/13: SURGERY; NO WARFARIN, NO LOVENOX    1/14: Take 3 tablets (15 mg) warfarin, Lovenox  every 12 hours 1/15:Take 3 tablets (15 mg) warfarin, Lovenox  every 12 hours 1/16: Take 3 tablets (15 mg) warfarin, Lovenox  every 12 hours 1/17: Take 3 tablets (15 mg) warfarin, Lovenox  every 12 hours 1/18: Take 2 tablets (10 mg) warfarin, Lovenox  every 12 hours 1/19: Take 2 tablets (10 mg) warfarin, Lovenox  every 12 hours 1/20: Recheck INR at [facility]; no warfarin and no Lovenox  until after INR check

## 2023-12-08 NOTE — Telephone Encounter (Signed)
 Pt is compliant with warfarin management and PCP apts.  Sent in refill of warfarin to requested pharmacy.

## 2023-12-08 NOTE — Progress Notes (Signed)
 I have reviewed the patient's encounter and agree with the documentation.  Katina Degree. [name], MD 12/08/2023 7:47 AM

## 2023-12-08 NOTE — Patient Instructions (Addendum)
 Pre visit review using our clinic review tool, if applicable. No additional management support is needed unless otherwise documented below in the visit note.  1/8: Take last dose of warfarin 1/9: NO warfarin, NO Lovenox  1/10: NO warfarin, Lovenox  every 12 hours 1/11: NO warfarin, Lovenox  every 12 hours 1/12: NO warfarin, Lovenox  AM ONLY (BEFORE 7AM)   1/13: SURGERY; NO WARFARIN, NO LOVENOX    1/14: Take 3 tablets (15 mg) warfarin, Lovenox  every 12 hours 1/15:Take 3 tablets (15 mg) warfarin, Lovenox  every 12 hours 1/16: Take 3 tablets (15 mg) warfarin, Lovenox  every 12 hours 1/17: Take 3 tablets (15 mg) warfarin, Lovenox  every 12 hours 1/18: Take 2 tablets (10 mg) warfarin, Lovenox  every 12 hours 1/19: Take 2 tablets (10 mg) warfarin, Lovenox  every 12 hours 1/20: Recheck INR at [facility]; no warfarin and no Lovenox  until after INR check

## 2023-12-12 ENCOUNTER — Encounter: Payer: Self-pay | Admitting: Certified Registered Nurse Anesthetist

## 2023-12-13 ENCOUNTER — Encounter: Payer: Self-pay | Admitting: Gastroenterology

## 2023-12-13 ENCOUNTER — Ambulatory Visit: Payer: BC Managed Care – PPO | Admitting: Gastroenterology

## 2023-12-13 VITALS — BP 124/78 | HR 74 | Temp 98.8°F | Resp 12 | Ht 74.0 in | Wt 294.0 lb

## 2023-12-13 DIAGNOSIS — K529 Noninfective gastroenteritis and colitis, unspecified: Secondary | ICD-10-CM | POA: Diagnosis not present

## 2023-12-13 DIAGNOSIS — D123 Benign neoplasm of transverse colon: Secondary | ICD-10-CM | POA: Diagnosis not present

## 2023-12-13 DIAGNOSIS — D124 Benign neoplasm of descending colon: Secondary | ICD-10-CM | POA: Diagnosis not present

## 2023-12-13 DIAGNOSIS — D120 Benign neoplasm of cecum: Secondary | ICD-10-CM

## 2023-12-13 DIAGNOSIS — K219 Gastro-esophageal reflux disease without esophagitis: Secondary | ICD-10-CM

## 2023-12-13 DIAGNOSIS — K5730 Diverticulosis of large intestine without perforation or abscess without bleeding: Secondary | ICD-10-CM | POA: Diagnosis not present

## 2023-12-13 DIAGNOSIS — D121 Benign neoplasm of appendix: Secondary | ICD-10-CM

## 2023-12-13 DIAGNOSIS — K635 Polyp of colon: Secondary | ICD-10-CM | POA: Diagnosis not present

## 2023-12-13 MED ORDER — SODIUM CHLORIDE 0.9 % IV SOLN: 500.0000 mL | Freq: Once | INTRAVENOUS | Status: DC

## 2023-12-13 NOTE — Op Note (Signed)
 [facility] Patient Name: Wesley Hancock Procedure Date: 12/13/2023 1:08 PM MRN: [id number] Endoscopist: Victory L. Legrand , MD, [id number] Age: 53 Referring MD:  Date of Birth: [birth year] Gender: Male Account #: [id number] Procedure:                Colonoscopy Indications:              Chronic diarrhea, Follow-up of diverticulitis                           clinical details in Nov 2024 office consult note Medicines:                Monitored Anesthesia Care Procedure:                Pre-Anesthesia Assessment:                           - Prior to the procedure, a History and Physical                            was performed, and patient medications and                            allergies were reviewed. The patient's tolerance of                            previous anesthesia was also reviewed. The risks                            and benefits of the procedure and the sedation                            options and risks were discussed with the patient.                            All questions were answered, and informed consent                            was obtained. Prior Anticoagulants: The patient                            last took Coumadin  (warfarin) 4 days and Lovenox                             (enoxaparin ) 1 day prior to the procedure. ASA                            Grade Assessment: III - A patient with severe                            systemic disease. After reviewing the risks and                            benefits, the patient was deemed in satisfactory  condition to undergo the procedure.                           After obtaining informed consent, the colonoscope                            was passed under direct vision. Throughout the                            procedure, the patient's blood pressure, pulse, and                            oxygen saturations were monitored continuously. The                            Olympus  CF-HQ190L ([id number]) Colonoscope was                            introduced through the anus and advanced to the the                            cecum, identified by appendiceal orifice and                            ileocecal valve. The colonoscopy was performed with                            difficulty due to a redundant colon, significant                            looping and the patient's body habitus as well as                            respiratory motion. Successful completion of the                            procedure was aided by using manual pressure and                            straightening and shortening the scope to obtain                            bowel loop reduction. The patient tolerated the                            procedure fairly well. The quality of the bowel                            preparation was excellent. The ileocecal valve,                            appendiceal orifice, and rectum were photographed. Scope In: 1:34:24 PM Scope Out: 1:55:26 PM Scope Withdrawal Time: 0 hours 16 minutes 59 seconds  Total  Procedure Duration: 0 hours 21 minutes 2 seconds  Findings:                 The perianal and digital rectal examinations were                            normal.                           Repeat examination of right colon under NBI                            performed.                           Normal mucosa was found in the entire colon.                            Biopsies for histology were taken with a cold                            forceps from the right colon and left colon for                            evaluation of microscopic colitis. (Jar 2)                           A 6-8 mm polyp was found in the appendiceal                            orifice. The polyp was semi-sessile. The polyp was                            removed with a cold snare. Resection and retrieval                            were complete. (Jar 1)                           Two  sessile polyps were found in the descending                            colon and transverse colon. The polyps were                            diminutive in size. These polyps were removed with                            a cold snare. Resection and retrieval were                            complete. (Jar 3)                           Multiple diverticula were found in the left colon.  The exam was otherwise without abnormality on                            direct and retroflexion views. Complications:            No immediate complications. Estimated Blood Loss:     Estimated blood loss was minimal. Impression:               - Normal mucosa in the entire examined colon.                            Biopsied.                           - One 6 mm polyp at the appendiceal orifice,                            removed with a cold snare. Resected and retrieved.                           - Two diminutive polyps in the descending colon and                            in the transverse colon, removed with a cold snare.                            Resected and retrieved.                           - Diverticulosis in the left colon.                           - The examination was otherwise normal on direct                            and retroflexion views. Recommendation:           - Patient has a contact number available for                            emergencies. The signs and symptoms of potential                            delayed complications were discussed with the                            patient. Return to normal activities tomorrow.                            Written discharge instructions were provided to the                            patient.                           - Resume previous diet.                           -  Resume Coumadin  (warfarin) today and Lovenox                             (enoxaparin ) today at prior doses.                           - Await pathology  results.                           - Repeat colonoscopy is recommended for                            surveillance. The colonoscopy date will be                            determined after pathology results from today's                            exam become available for review.                           - See the other procedure note for documentation of                            additional recommendations. Calie Buttrey L. Legrand, MD 12/13/2023 2:09:59 PM This report has been signed electronically.

## 2023-12-13 NOTE — Progress Notes (Signed)
 Called to room to assist during endoscopic procedure.  Patient ID and intended procedure confirmed with present staff. Received instructions for my participation in the procedure from the performing physician.

## 2023-12-13 NOTE — Op Note (Signed)
 [facility] Patient Name: Wesley Hancock Procedure Date: 12/13/2023 1:08 PM MRN: [id number] Endoscopist: Victory L. Legrand , MD, [id number] Age: 53 Referring MD:  Date of Birth: May 15, 1971 Gender: Male Account #: [id number] Procedure:                Upper GI endoscopy Indications:              Esophageal reflux symptoms that recur despite                            appropriate therapy Medicines:                Monitored Anesthesia Care Procedure:                Pre-Anesthesia Assessment:                           - Prior to the procedure, a History and Physical                            was performed, and patient medications and                            allergies were reviewed. The patient's tolerance of                            previous anesthesia was also reviewed. The risks                            and benefits of the procedure and the sedation                            options and risks were discussed with the patient.                            All questions were answered, and informed consent                            was obtained. Prior Anticoagulants: The patient                            last took Coumadin  (warfarin) 4 days and Lovenox                             (enoxaparin ) 1 day prior to the procedure. ASA                            Grade Assessment: III - A patient with severe                            systemic disease. After reviewing the risks and                            benefits, the patient was deemed in satisfactory  condition to undergo the procedure.                           After obtaining informed consent, the endoscope was                            passed under direct vision. Throughout the                            procedure, the patient's blood pressure, pulse, and                            oxygen saturations were monitored continuously. The                            GIF [id number] #[id number] was introduced  through the                            mouth, and advanced to the second part of duodenum.                            The upper GI endoscopy was accomplished without                            difficulty. The patient tolerated the procedure                            fairly well. Scope In: Scope Out: Findings:                 The larynx was normal.                           The esophagus was normal.                           The stomach was normal.                           The cardia and gastric fundus were normal on                            retroflexion.                           The examined duodenum was normal. Complications:            No immediate complications. Estimated Blood Loss:     Estimated blood loss: none. Impression:               - Normal larynx.                           - Normal esophagus.                           - Normal stomach.                           -  Normal examined duodenum.                           - No specimens collected. Recommendation:           - Patient has a contact number available for                            emergencies. The signs and symptoms of potential                            delayed complications were discussed with the                            patient. Return to normal activities tomorrow.                            Written discharge instructions were provided to the                            patient.                           - Resume previous diet.                           - Continue present medications.                           - Follow an antireflux regimen indefinitely.                           - Resume Coumadin  (warfarin) today and Lovenox                             (enoxaparin ) today at prior doses.                           - See the other procedure note for documentation of                            additional recommendations. Cyprian Gongaware L. Legrand, MD 12/13/2023 2:12:20 PM This report has been signed electronically.

## 2023-12-13 NOTE — Progress Notes (Signed)
 1249 Robinul 0.1 mg IV given due large amount of secretions upon assessment.  MD made aware, vss

## 2023-12-13 NOTE — Progress Notes (Signed)
 History and Physical:  This patient presents for endoscopic testing for: Encounter Diagnoses  Name Primary?   Gastroesophageal reflux disease, unspecified whether esophagitis present Yes   Chronic diarrhea     53 year old man with extensive clinical history outlined in APP office consult note dated 10/20/2023.  Recurrent diverticulitis, chronic diarrhea, GERD Reports that he has recovered from the Oct 2024 diverticulitis episode, no current abdominal pain and no recurrence of pain since the last office visit.  Patient is otherwise without complaints or active issues today.   Past Medical History: Past Medical History:  Diagnosis Date   Acute nonintractable headache 09/06/2019   Acute respiratory failure with hypoxia ([name]) 08/30/2019   AKI (acute kidney injury) ([name]) 08/30/2019   Antiphospholipid syndrome ([name]) 10/30/2019   Anxiety    APS (antiphospholipid syndrome) ([name])    Arthritis    Colon polyp    ? hyperplastic   Depression    Diarrhea 05/21/2020   DVT (deep venous thrombosis) ([name])    Dyspnea on exertion 09/08/2017   Quit smoking 2015 with onset of symptoms in 2016  Spirometry 09/08/2017  Flat f/v loop  - d/c acei 09/08/2017  - 01/04/2020   Walked RA x two laps =  approx 564ft @ fast pace - stopped due to end of study/ min sob with sats of 93 % at the end of the study. - PFT's  03/08/20   FEV1 3.84 (83 % ) ratio 0..90  p 3 % improvement from saba p ? 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 06/26/20, 03/08/20   Snoring 07/09/2020   Stress fracture    in back, seeing PT   Stroke ([name])    Upper airway cough syndrome 01/05/2020   Onset mid Jan 2021 while on otc PPI  - max rx for gerd 01/04/2020 >>>      Witnessed apneic spells 07/09/2020     Past Surgical History: Past Surgical History:  Procedure Laterality Date   BRONCHIAL WASHINGS  01/14/2021   Procedure: BRONCHIAL WASHINGS;  Surgeon: Kara Dorn NOVAK, MD;  Location: [facility];  Service: Pulmonary;;   CHOLECYSTECTOMY     VIDEO BRONCHOSCOPY N/A 01/14/2021   Procedure: VIDEO BRONCHOSCOPY WITHOUT FLUORO;  Surgeon: Kara Dorn NOVAK, MD;  Location: [facility];  Service: Pulmonary;  Laterality: N/A;    Allergies: Allergies  Allergen Reactions   Diltiazem  Hcl Diarrhea and Other (See Comments)    Lethargic     Outpatient Meds: Current Outpatient Medications  Medication Sig Dispense Refill   acetaminophen  (TYLENOL ) 500 MG tablet Take 1,000 mg by mouth every 6 (six) hours as needed  for mild pain.     buPROPion  (WELLBUTRIN ) 75 MG tablet TAKE 1 TABLET BY MOUTH TWICE A DAY 180 tablet 1   enoxaparin  (LOVENOX ) 120 MG/0.8ML injection Inject 0.8 mLs (120 mg total) into the skin every 12 (twelve) hours. TAKE AS DIRECTED BY ANTICOAGULATION CLINIC 13.6 mL 0   escitalopram  (LEXAPRO ) 20 MG tablet TAKE 1 TABLET BY MOUTH EVERY DAY 90 tablet 1   fluticasone  (FLONASE ) 50 MCG/ACT nasal spray Place 1 spray into both nostrils daily as needed for allergies.     hydrochlorothiazide  (HYDRODIURIL ) 25 MG tablet Take 1 tablet (25 mg total) by mouth daily. 90 tablet 2   KLOR-CON  M20 20 MEQ  tablet TAKE 1 TABLET BY MOUTH EVERY DAY 90 tablet 1   Multiple Vitamin (MULTIVITAMIN) tablet Take by mouth daily. 2 chews daily     mycophenolate  (CELLCEPT ) 500 MG tablet TAKE 2 TABLETS BY MOUTH TWICE DAILY 360 tablet 1   omeprazole (PRILOSEC) 20 MG capsule Take 20 mg by mouth 2 (two) times daily before a meal.     rosuvastatin  (CRESTOR ) 20 MG tablet Take 1 tablet (20 mg total) by mouth daily. 90 tablet 1   telmisartan  (MICARDIS ) 80 MG tablet TAKE 1 TABLET BY MOUTH EVERY DAY 90 tablet 2   albuterol  (VENTOLIN  HFA) 108 (90 Base) MCG/ACT inhaler Inhale 2 puffs into the lungs every 6 (six) hours as needed for wheezing or shortness of breath. 8 g 6   nitroGLYCERIN  (NITROSTAT ) 0.4 MG SL tablet Place 1 tablet (0.4 mg total) under the tongue every 5 (five) minutes as needed for chest pain. 90 tablet 3   triamcinolone  cream (KENALOG ) 0.1 % Apply to affected area 1-2 times daily (Patient not taking: Reported on 12/13/2023) 30 g 0   warfarin (COUMADIN ) 5 MG tablet TAKE 2 TABLETS DAILY EXCEPT TAKE 3 TABLETS ON MONDAYS OR AS DIRECTED BY ANTICOAGULATION CLINIC 200 tablet 1   Current Facility-Administered Medications  Medication Dose Route Frequency Provider Last Rate Last Admin   0.9 %  sodium chloride  infusion  500 mL Intravenous Once Danis, Remon Quinto L III, MD          ___________________________________________________________________ Objective   Exam:  BP 129/76   Pulse 78   Temp 98.8 F (37.1 C)   Ht 6' 2 (1.88 m)   Wt 294 lb (133.4 kg)   SpO2 96%   BMI 37.75 kg/m   CV: regular , S1/S2 Resp: clear to auscultation bilaterally, normal RR and effort noted GI: soft, no tenderness, with active bowel sounds.   Assessment: Encounter Diagnoses  Name Primary?   Gastroesophageal reflux disease, unspecified whether esophagitis present Yes   Chronic diarrhea      Plan: Colonoscopy EGD  The benefits and risks of the planned procedure were described in detail with the patient or (when  appropriate) their health care proxy.  Risks were outlined as including, but not limited to, bleeding, infection, perforation, adverse medication reaction leading to cardiac or pulmonary decompensation, pancreatitis (if ERCP).  The limitation of incomplete mucosal visualization was also discussed.  No guarantees or warranties were given.  The patient is appropriate for an endoscopic procedure in the ambulatory setting.   - Victory Brand, MD

## 2023-12-13 NOTE — Progress Notes (Signed)
 Report given to PACU, vss

## 2023-12-13 NOTE — Patient Instructions (Signed)
 Please read handouts provided. Continue present medications. Await pathology results. Follow an anti-reflux regimen indefinitely. Resume Coumadin  ( warfarin ) today and Lovenox  ( enoxaparin  ) today at prior doses.   YOU HAD AN ENDOSCOPIC PROCEDURE TODAY AT THE [facility]:   Refer to the procedure report that was given to you for any specific questions about what was found during the examination.  If the procedure report does not answer your questions, please call your gastroenterologist to clarify.  If you requested that your care partner not be given the details of your procedure findings, then the procedure report has been included in a sealed envelope for you to review at your convenience later.  YOU SHOULD EXPECT: Some feelings of bloating in the abdomen. Passage of more gas than usual.  Walking can help get rid of the air that was put into your GI tract during the procedure and reduce the bloating. If you had a lower endoscopy (such as a colonoscopy or flexible sigmoidoscopy) you may notice spotting of blood in your stool or on the toilet paper. If you underwent a bowel prep for your procedure, you may not have a normal bowel movement for a few days.  Please Note:  You might notice some irritation and congestion in your nose or some drainage.  This is from the oxygen used during your procedure.  There is no need for concern and it should clear up in a day or so.  SYMPTOMS TO REPORT IMMEDIATELY:  Following lower endoscopy (colonoscopy or flexible sigmoidoscopy):  Excessive amounts of blood in the stool  Significant tenderness or worsening of abdominal pains  Swelling of the abdomen that is new, acute  Fever of 100F or higher  Following upper endoscopy (EGD)  Vomiting of blood or coffee ground material  New chest pain or pain under the shoulder blades  Painful or persistently difficult swallowing  New shortness of breath  Fever of 100F or higher  Black, tarry-looking  stools  For urgent or emergent issues, a gastroenterologist can be reached at any hour by calling [phone]. Do not use MyChart messaging for urgent concerns.    DIET:  We do recommend a small meal at first, but then you may proceed to your regular diet.  Drink plenty of fluids but you should avoid alcoholic beverages for 24 hours.  ACTIVITY:  You should plan to take it easy for the rest of today and you should NOT DRIVE or use heavy machinery until tomorrow (because of the sedation medicines used during the test).    FOLLOW UP: Our staff will call the number listed on your records the next business day following your procedure.  We will call around 7:15- 8:00 am to check on you and address any questions or concerns that you may have regarding the information given to you following your procedure. If we do not reach you, we will leave a message.     If any biopsies were taken you will be contacted by phone or by letter within the next 1-3 weeks.  Please call us  at [phone] if you have not heard about the biopsies in 3 weeks.    SIGNATURES/CONFIDENTIALITY: You and/or your care partner have signed paperwork which will be entered into your electronic medical record.  These signatures attest to the fact that that the information above on your After Visit Summary has been reviewed and is understood.  Full responsibility of the confidentiality of this discharge information lies with you and/or your  care-partner.

## 2023-12-14 ENCOUNTER — Telehealth: Payer: Self-pay

## 2023-12-14 NOTE — Telephone Encounter (Signed)
 Left message on follow up call.

## 2023-12-15 ENCOUNTER — Ambulatory Visit (HOSPITAL_COMMUNITY)
Admission: RE | Admit: 2023-12-15 | Discharge: 2023-12-15 | Disposition: A | Discharge status: Home / Self Care | Payer: BC Managed Care – PPO | Source: Ambulatory Visit | Attending: Pulmonary Disease | Admitting: Pulmonary Disease

## 2023-12-15 DIAGNOSIS — J984 Other disorders of lung: Secondary | ICD-10-CM | POA: Diagnosis not present

## 2023-12-15 DIAGNOSIS — E042 Nontoxic multinodular goiter: Secondary | ICD-10-CM | POA: Diagnosis not present

## 2023-12-15 DIAGNOSIS — J849 Interstitial pulmonary disease, unspecified: Secondary | ICD-10-CM | POA: Diagnosis not present

## 2023-12-15 DIAGNOSIS — J479 Bronchiectasis, uncomplicated: Secondary | ICD-10-CM | POA: Diagnosis not present

## 2023-12-16 LAB — SURGICAL PATHOLOGY

## 2023-12-20 ENCOUNTER — Ambulatory Visit (INDEPENDENT_AMBULATORY_CARE_PROVIDER_SITE_OTHER): Payer: BC Managed Care – PPO

## 2023-12-20 DIAGNOSIS — Z7901 Long term (current) use of anticoagulants: Secondary | ICD-10-CM

## 2023-12-20 LAB — POCT INR: INR: 1.3 — AB (ref 2.0–3.0)

## 2023-12-20 NOTE — Progress Notes (Signed)
Pt had colonoscopy on 1/13 and has been on a lovenox bridge.  Advised pt he should remain on lovenox until he is therapeutic. Pt does not want anymore lovenox. Advised of risks of subtherapeutic INR. Pt verbalized understanding. Increase dose today to take 4 tablets and increase dose tomorrow to take 3 tablets and then continue 2 tablets daily except take 3 tablets on Mondays. Recheck in 1 weeks.

## 2023-12-20 NOTE — Patient Instructions (Addendum)
Pre visit review using our clinic review tool, if applicable. No additional management support is needed unless otherwise documented below in the visit note.  Increase dose today to take 4 tablets and increase dose tomorrow to take 3 tablets and then continue 2 tablets daily except take 3 tablets on Mondays. Recheck in 1 weeks.

## 2023-12-21 ENCOUNTER — Encounter: Payer: Self-pay | Admitting: Gastroenterology

## 2023-12-22 ENCOUNTER — Encounter: Payer: Self-pay | Admitting: Physician Assistant

## 2023-12-22 ENCOUNTER — Ambulatory Visit: Payer: Self-pay | Admitting: Family Medicine

## 2023-12-22 ENCOUNTER — Ambulatory Visit: Payer: BC Managed Care – PPO | Admitting: Physician Assistant

## 2023-12-22 VITALS — BP 120/80 | HR 96 | Temp 97.2°F | Ht 74.0 in | Wt 304.0 lb

## 2023-12-22 DIAGNOSIS — R252 Cramp and spasm: Secondary | ICD-10-CM

## 2023-12-22 DIAGNOSIS — R197 Diarrhea, unspecified: Secondary | ICD-10-CM

## 2023-12-22 LAB — COMPREHENSIVE METABOLIC PANEL
ALT: 38 U/L (ref 0–53)
AST: 24 U/L (ref 0–37)
Albumin: 4.4 g/dL (ref 3.5–5.2)
Alkaline Phosphatase: 51 U/L (ref 39–117)
BUN: 17 mg/dL (ref 6–23)
CO2: 24 meq/L (ref 19–32)
Calcium: 9.4 mg/dL (ref 8.4–10.5)
Chloride: 102 meq/L (ref 96–112)
Creatinine, Ser: 0.96 mg/dL (ref 0.40–1.50)
GFR: 91.38 mL/min (ref >60.00)
Glucose, Bld: 102 mg/dL — ABNORMAL HIGH (ref 70–99)
Potassium: 4 meq/L (ref 3.5–5.1)
Sodium: 135 meq/L (ref 135–145)
Total Bilirubin: 0.6 mg/dL (ref 0.2–1.2)
Total Protein: 8.3 g/dL (ref 6.0–8.3)

## 2023-12-22 LAB — CBC WITH DIFFERENTIAL/PLATELET
Basophils Absolute: 0.1 10*3/uL (ref 0.0–0.1)
Basophils Relative: 1 % (ref 0.0–3.0)
Eosinophils Absolute: 0.2 10*3/uL (ref 0.0–0.7)
Eosinophils Relative: 3.1 % (ref 0.0–5.0)
HCT: 49.1 % (ref 39.0–52.0)
Hemoglobin: 16.3 g/dL (ref 13.0–17.0)
Lymphocytes Relative: 19.9 % (ref 12.0–46.0)
Lymphs Abs: 1.5 10*3/uL (ref 0.7–4.0)
MCHC: 33.1 g/dL (ref 30.0–36.0)
MCV: 88.7 fL (ref 78.0–100.0)
Monocytes Absolute: 0.7 10*3/uL (ref 0.1–1.0)
Monocytes Relative: 8.8 % (ref 3.0–12.0)
Neutro Abs: 5.1 10*3/uL (ref 1.4–7.7)
Neutrophils Relative %: 67.2 % (ref 43.0–77.0)
Platelets: 297 10*3/uL (ref 150.0–400.0)
RBC: 5.54 Mil/uL (ref 4.22–5.81)
RDW: 15.2 % (ref 11.5–15.5)
WBC: 7.6 10*3/uL (ref 4.0–10.5)

## 2023-12-22 LAB — CK: Total CK: 147 U/L (ref 7–232)

## 2023-12-22 LAB — MAGNESIUM: Magnesium: 1.8 mg/dL (ref 1.5–2.5)

## 2023-12-22 NOTE — Patient Instructions (Signed)
It was great to see you!  I will be in touch with all results  Take care,  Jarold Motto PA-C

## 2023-12-22 NOTE — Telephone Encounter (Signed)
Noted  

## 2023-12-22 NOTE — Telephone Encounter (Signed)
Chief Complaint: cramping Symptoms: abd and extremity cramping, diarrhea Frequency: yesterday Pertinent Negatives: Patient denies fever, blood in stool, SOB,   Disposition: [] ED /[] Urgent Care (no appt availability in office) / [x] Appointment(In office/virtual)/ []  [facility] [facility]/ [] Home Care/ [] Refused Recommended Disposition /[] [place] [facility]/ []  Follow-up with PCP  Additional Notes: Started with extremities cramping yesterday, today has nausea. Pt states he has a complex auto immune hx. States that his kidney function has been off recently. Denies potential food poisoning prior to s/s starting. Pt sched for this AM.  Copied from CRM [id number]. Topic: Clinical - Red Word Triage >> Dec 22, 2023  9:03 AM Irine Seal wrote: [facility] that prompted transfer to Nurse Triage: Severe Cramping muscle spasms, mainly in extremities, no vomiting, no fever, nauseated, diarrhea and can't sleep. Started 2 days ago. Pt is very anxious and worried that it is a result of his kidney function being slightly diminished Reason for Disposition . [1] Constant abdominal pain AND [2] present > 2 hours  Answer Assessment - Initial Assessment Questions 1. DIARRHEA SEVERITY: "How bad is the diarrhea?" "How many more stools have you had in the past 24 hours than normal?"    - NO DIARRHEA (SCALE 0)   - MILD (SCALE 1-3): Few loose or mushy BMs; increase of 1-3 stools over normal daily number of stools; mild increase in ostomy output.   -  MODERATE (SCALE 4-7): Increase of 4-6 stools daily over normal; moderate increase in ostomy output.   -  SEVERE (SCALE 8-10; OR "WORST POSSIBLE"): Increase of 7 or more stools daily over normal; moderate increase in ostomy output; incontinence.     mild 2. ONSET: "When did the diarrhea begin?"       Yesterday  3. BM CONSISTENCY: "How loose or watery is the diarrhea?"      Watery and loose 4. VOMITING: "Are you also vomiting?" If Yes, ask: "How many times in the past 24  hours?"      Just nausea 5. ABDOMEN PAIN: "Are you having any abdomen pain?" If Yes, ask: "What does it feel like?" (e.g., crampy, dull, intermittent, constant)      Cramping since yesterday evening.  6. ABDOMEN PAIN SEVERITY: If present, ask: "How bad is the pain?"  (e.g., Scale 1-10; mild, moderate, or severe)   - MILD (1-3): doesn't interfere with normal activities, abdomen soft and not tender to touch    - MODERATE (4-7): interferes with normal activities or awakens from sleep, abdomen tender to touch    - SEVERE (8-10): excruciating pain, doubled over, unable to do any normal activities       Cramping isn't that bad, the nausea is worse 8. HYDRATION: "Any signs of dehydration?" (e.g., dry mouth [not just dry lips], too weak to stand, dizziness, new weight loss) "When did you last urinate?"     Pt states he is keeping up on his fluid 9. EXPOSURE: "Have you traveled to a foreign country recently?" "Have you been exposed to anyone with diarrhea?" "Could you have eaten any food that was spoiled?"     No, I have lupus and my kidney functions have been a little weird, but I have never had issues like this 10. ANTIBIOTIC USE: "Are you taking antibiotics now or have you taken antibiotics in the past 2 months?"       denies 11. OTHER SYMPTOMS: "Do you have any other symptoms?" (e.g., fever, blood in stool)       denies  Protocols used: [facility]

## 2023-12-22 NOTE — Progress Notes (Signed)
Wesley Hancock is a 53 y.o. male here for a follow up of a pre-existing problem.  History of Present Illness:   Chief Complaint  Patient presents with   Diarrhea    Pt c/o diarrhea, started last night, having nausea also and cramping x 2 nights. No vomiting. Had Colonoscopy done 1/13    HPI  Diarrhea/Cramps in Extremities He complains of diarrhea that started last night . Accompanying symptoms include nausea, abdominal cramping, and cramping in his extremities.  He states that his cramping is worse when laying down and thus has been disturbing his sleep. He tends avoids electrolyte drinks as they tend to trigger his diverticulitis. He does drink one every few days to manage is kidney function.  He denies any vomiting or blood in stool. He does report taking Coumadin 5 mg 3x  and multiple Lovenox yesterday.  He recently underwent a colonoscopy on 1/13 and is wondering if that is contributing to his symptoms. Colonoscopy report states that diarrhea is cause by his bile acid dysregulation from gallbladder removal He reports compliance and good tolerance of Hydrodiuril 25 mg once daily.  He's been taking magnesium supplements to aid in managing his diverticulosis.   He does report experiencing stress due to not being able to vent since losing his mother 2.5 years ago.     Past Medical History:  Diagnosis Date   Acute nonintractable headache 09/06/2019   Acute respiratory failure with hypoxia ([name]) 08/30/2019   AKI (acute kidney injury) ([name]) 08/30/2019   Antiphospholipid syndrome ([name]) 10/30/2019   Anxiety    APS (antiphospholipid syndrome) ([name])    Arthritis    Colon polyp    ? hyperplastic   Depression    Diarrhea 05/21/2020   DVT (deep venous thrombosis) ([name])    Dyspnea on exertion 09/08/2017   "Quit smoking" 2015 with onset of symptoms in 2016  Spirometry 09/08/2017  Flat f/v loop  - d/c acei 09/08/2017  - 01/04/2020   Walked RA x two laps =  approx 524ft @ fast pace -  stopped due to end of study/ min sob with sats of 93 % at the end of the study. - PFT's  03/08/20   FEV1 3.84 (83 % ) ratio 0..90  p 3 % improvement from saba p ? 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 06/26/20, 03/08/20   Snoring 07/09/2020   Stress fracture    in back, seeing PT   Stroke ([name])    Upper airway cough syndrome 01/05/2020   Onset mid Jan 2021 while on otc PPI  - max rx for gerd 01/04/2020 >>>  Witnessed apneic spells 07/09/2020     Social History   Tobacco Use   Smoking status: Former    Current packs/day: 0.00    Average packs/day: 1 pack/day for 20.0 years (20.0 ttl pk-yrs)    Types: Cigarettes    Start date: 11/30/1993    Quit date: 11/30/2013    Years since quitting: 10.0    Passive exposure: Past   Smokeless tobacco: Former    Types: Snuff    Quit date: 05/21/2018  Vaping Use   Vaping status: Never Used  Substance Use Topics   Alcohol use: Yes    Comment: occ   Drug use: No    Past Surgical History:  Procedure Laterality Date   BRONCHIAL WASHINGS  01/14/2021   Procedure: BRONCHIAL WASHINGS;  Surgeon: Martina Sinner, MD;   Location: [facility];  Service: Pulmonary;;   CHOLECYSTECTOMY     VIDEO BRONCHOSCOPY N/A 01/14/2021   Procedure: VIDEO BRONCHOSCOPY WITHOUT FLUORO;  Surgeon: Martina Sinner, MD;  Location: [facility];  Service: Pulmonary;  Laterality: N/A;    Family History  Problem Relation Age of Onset   Diabetes Mother    Hyperlipidemia Mother    Hypertension Mother    Thyroid disease Mother        uncertain type--had surg--no cancer   Asthma Mother        died from PNA   Heart disease Father 47   Hyperlipidemia Father    Hypertension Father    Prostate cancer Father 71   Lung cancer Father 64       Dx 06/18/2017   Colon polyps Father    Irritable bowel syndrome Father    Diverticulitis Father    Diabetes Sister    Hyperlipidemia Sister    Hypertension Sister    Diabetes Maternal Grandmother    Heart disease Maternal Grandmother    Hyperlipidemia Maternal Grandmother    Hypertension Maternal Grandmother    Colon cancer Maternal Grandmother    Heart disease Maternal Grandfather    Hyperlipidemia Maternal Grandfather    Hypertension Maternal Grandfather    Stroke Maternal Grandfather    Liver cancer Maternal Grandfather    Irritable bowel syndrome Maternal Grandfather    Heart disease Paternal Grandmother    Healthy Daughter    Healthy Daughter    Healthy Son    Esophageal cancer Neg Hx    Stomach cancer Neg Hx    Rectal cancer Neg Hx     Allergies  Allergen Reactions   Diltiazem Hcl Diarrhea and Other (See Comments)    Lethargic     Current Medications:   Current Outpatient Medications:    acetaminophen (TYLENOL) 500 MG tablet, Take 1,000 mg by mouth every 6 (six) hours as needed for mild pain., Disp: , Rfl:    albuterol (VENTOLIN HFA) 108 (90 Base) MCG/ACT inhaler, Inhale 2 puffs into the lungs every 6 (six) hours as needed for wheezing or shortness of breath., Disp: 8 g, Rfl: 6   buPROPion (WELLBUTRIN) 75 MG tablet, TAKE 1 TABLET BY MOUTH TWICE A DAY, Disp: 180  tablet, Rfl: 1   escitalopram (LEXAPRO) 20 MG tablet, TAKE 1 TABLET BY MOUTH EVERY DAY, Disp: 90 tablet, Rfl: 1   fluticasone (FLONASE) 50 MCG/ACT nasal spray, Place 1 spray into both nostrils daily as needed for allergies., Disp: , Rfl:    hydrochlorothiazide (HYDRODIURIL) 25 MG tablet, Take 1 tablet (25 mg total) by mouth daily., Disp: 90 tablet, Rfl: 2   KLOR-CON M20 20  MEQ tablet, TAKE 1 TABLET BY MOUTH EVERY DAY, Disp: 90 tablet, Rfl: 1   Multiple Vitamin (MULTIVITAMIN) tablet, Take by mouth daily. 2 chews daily, Disp: , Rfl:    mycophenolate (CELLCEPT) 500 MG tablet, TAKE 2 TABLETS BY MOUTH TWICE DAILY, Disp: 360 tablet, Rfl: 1   omeprazole (PRILOSEC) 20 MG capsule, Take 20 mg by mouth 2 (two) times daily before a meal., Disp: , Rfl:    telmisartan (MICARDIS) 80 MG tablet, TAKE 1 TABLET BY MOUTH EVERY DAY, Disp: 90 tablet, Rfl: 2   triamcinolone cream (KENALOG) 0.1 %, Apply to affected area 1-2 times daily, Disp: 30 g, Rfl: 0   warfarin (COUMADIN) 5 MG tablet, TAKE 2 TABLETS DAILY EXCEPT TAKE 3 TABLETS ON MONDAYS OR AS DIRECTED BY ANTICOAGULATION CLINIC, Disp: 200 tablet, Rfl: 1   nitroGLYCERIN (NITROSTAT) 0.4 MG SL tablet, Place 1 tablet (0.4 mg total) under the tongue every 5 (five) minutes as needed for chest pain., Disp: 90 tablet, Rfl: 3   rosuvastatin (CRESTOR) 20 MG tablet, Take 1 tablet (20 mg total) by mouth daily., Disp: 90 tablet, Rfl: 1   Review of Systems:   Review of Systems  Gastrointestinal:  Positive for abdominal pain, diarrhea and nausea. Negative for blood in stool and vomiting.  Negative unless otherwise specified per HPI.   Vitals:   Vitals:   12/22/23 0945  BP: 120/80  Pulse: 96  Temp: (!) 97.2 F (36.2 C)  TempSrc: Temporal  SpO2: 97%  Weight: (!) 304 lb (137.9 kg)  Height: 6\' 2"  (1.88 m)     Body mass index is 39.03 kg/m.  Physical Exam:   Physical Exam Vitals and nursing note reviewed.  Constitutional:      General: He is not in acute  distress.    Appearance: He is well-developed. He is not ill-appearing or toxic-appearing.  Cardiovascular:     Rate and Rhythm: Normal rate and regular rhythm.     Pulses: Normal pulses.     Heart sounds: Normal heart sounds, S1 normal and S2 normal.  Pulmonary:     Effort: Pulmonary effort is normal.     Breath sounds: Normal breath sounds.  Abdominal:     General: Abdomen is flat. Bowel sounds are normal.     Palpations: Abdomen is soft.     Tenderness: There is no abdominal tenderness.  Skin:    General: Skin is warm and dry.  Neurological:     Mental Status: He is alert.     GCS: GCS eye subscore is 4. GCS verbal subscore is 5. GCS motor subscore is 6.  Psychiatric:        Speech: Speech normal.        Behavior: Behavior normal. Behavior is cooperative.     Assessment and Plan:   Diarrhea, unspecified type Appears hydrated No red flags Requesting blood work to ensure electrolytes/kidney function are wnl Continue adequate hydration He is well-versed in emergency room precautions Declines nausea prescription  Consider follow-up with gastrointestinal if symptom(s) persists/worsen  Cramps, extremity Will update blood work, including CK, to rule out organic cause Continue adequate hydration and electrolytes as able Close follow-up with Primary Care Provider (PCP) if persists   Jarold Motto, PA-C  I,Safa M Kadhim,acting as a scribe for [employer], PA.,have documented all relevant documentation on the behalf of Jarold Motto, PA,as directed by  Jarold Motto, PA while in the presence of Jarold Motto, [place].   I, Jarold Motto, [place], have reviewed all documentation for  this visit. The documentation on 12/22/23 for the exam, diagnosis, procedures, and orders are all accurate and complete.

## 2023-12-24 ENCOUNTER — Telehealth: Payer: BC Managed Care – PPO | Admitting: Physician Assistant

## 2023-12-24 DIAGNOSIS — R197 Diarrhea, unspecified: Secondary | ICD-10-CM | POA: Diagnosis not present

## 2023-12-24 DIAGNOSIS — R1032 Left lower quadrant pain: Secondary | ICD-10-CM | POA: Diagnosis not present

## 2023-12-24 NOTE — Progress Notes (Signed)
   Thank you for the details you included in the comment boxes. Those details are very helpful in determining the best course of treatment for you and help [place] to provide the best care. Because diverticulitis is not treated through E-visits, we recommend that you schedule a Virtual Urgent Care video visit in order for the provider to better assess what is going on.  The provider will be able to give you a more accurate diagnosis and treatment plan if we can more freely discuss your symptoms and with the addition of a virtual examination.   If you change your visit to a video visit, we will bill your insurance (similar to an office visit) and you will not be charged for this e-Visit. You will be able to stay at home and speak with the first available [facility] advanced practice provider. The link to do a video visit is in the drop down Menu tab of your Welcome screen in MyChart.        I have spent 5 minutes in review of e-visit questionnaire, review and updating patient chart, medical decision making and response to patient.   Margaretann Loveless, PA-C

## 2023-12-24 NOTE — Telephone Encounter (Signed)
Copied from CRM [id number]. Topic: Clinical - Medical Advice >> Dec 24, 2023 10:36 AM Theodis Sato wrote: Reason for CRM: Patient states he saw Jarold Motto on 1/22 and she suggested he may have diverticulitis and offered antibiotics but patient declined. Patient states he knows now that he does have diverticulitis and requesting antibiotics from Dr. [name] or Jarold Motto.

## 2023-12-29 ENCOUNTER — Ambulatory Visit (INDEPENDENT_AMBULATORY_CARE_PROVIDER_SITE_OTHER): Payer: BC Managed Care – PPO

## 2023-12-29 ENCOUNTER — Telehealth: Payer: Self-pay | Admitting: *Deleted

## 2023-12-29 DIAGNOSIS — Z7901 Long term (current) use of anticoagulants: Secondary | ICD-10-CM | POA: Diagnosis not present

## 2023-12-29 LAB — POCT INR: INR: 1.6 — AB (ref 2.0–3.0)

## 2023-12-29 NOTE — Telephone Encounter (Signed)
Patient returned call to the office. Patient states he missed those additional days due to a cold that turned into pneumonia. Patient advised that we could not cover those days because we do not treat him for that. Patient advised to contact his pulmonologist.

## 2023-12-29 NOTE — Telephone Encounter (Signed)
Received a fax from [employer] for a medical certification update. Patient had been approved for  FMLA with 2 episodes per 1 month lasting 1 day per episode.   Per Fax patient has exceeded the number of absences approved.   Patient has missed 12/14/2023, 12/15/2023, 12/20/2023, 12/22/2023, 12/23/2023 and 12/24/2023.  Attempted to contact the patient and left message advising patient we need to discuss reason for additional absences to see if Dr. Corliss Skains would approve them. Advised patient to call the office to discuss.

## 2023-12-29 NOTE — Patient Instructions (Addendum)
Pre visit review using our clinic review tool, if applicable. No additional management support is needed unless otherwise documented below in the visit note.  Increase dose today to take 3 tablets and increase dose tomorrow to take 3 tablets and then change weekly dose to take 2 tablets daily except take 3 tablets on Mondays and Thursdays. Recheck in 2 weeks.

## 2023-12-29 NOTE — Progress Notes (Signed)
Pt denies any changes and does not know why he is subtherapeutic. Increase dose today to take 3 tablets and increase dose tomorrow to take 3 tablets and then change weekly dose to take 2 tablets daily except take 3 tablets on Mondays and Thursdays. Recheck in 2 weeks.

## 2024-01-03 ENCOUNTER — Telehealth: Payer: BC Managed Care – PPO | Admitting: Family Medicine

## 2024-01-03 ENCOUNTER — Telehealth: Payer: Self-pay | Admitting: Pulmonary Disease

## 2024-01-03 NOTE — Telephone Encounter (Signed)
Patient has an appointment on February 20th but his FMLA paperwork is due on Friday February the 7th. [employer] will be sending [place] his paperwork either today or tomorrow. Patient will be advised that it usually takes 7 to 10 businesses days for [facility] paperwork to be completed.

## 2024-01-05 ENCOUNTER — Ambulatory Visit: Payer: BC Managed Care – PPO | Admitting: Family Medicine

## 2024-01-05 ENCOUNTER — Encounter: Payer: Self-pay | Admitting: Family Medicine

## 2024-01-05 VITALS — BP 108/70 | HR 77 | Temp 97.4°F | Ht 74.0 in | Wt 302.0 lb

## 2024-01-05 DIAGNOSIS — J849 Interstitial pulmonary disease, unspecified: Secondary | ICD-10-CM

## 2024-01-05 DIAGNOSIS — E785 Hyperlipidemia, unspecified: Secondary | ICD-10-CM | POA: Diagnosis not present

## 2024-01-05 DIAGNOSIS — I639 Cerebral infarction, unspecified: Secondary | ICD-10-CM

## 2024-01-05 DIAGNOSIS — M329 Systemic lupus erythematosus, unspecified: Secondary | ICD-10-CM

## 2024-01-05 DIAGNOSIS — R7303 Prediabetes: Secondary | ICD-10-CM

## 2024-01-05 DIAGNOSIS — D6861 Antiphospholipid syndrome: Secondary | ICD-10-CM

## 2024-01-05 NOTE — Assessment & Plan Note (Signed)
 Following with rheum at allergy.  On CellCept .  Will complete FMLA paperwork today.

## 2024-01-05 NOTE — Assessment & Plan Note (Signed)
 He will come back soon for annual physical later this year.

## 2024-01-05 NOTE — Patient Instructions (Signed)
 It was very nice to see you today!  We completed your FMLA paperwork.  Please come back soon for your physical  Return for Annual Physical.   Take care, Dr Kennyth  PLEASE NOTE:  If you had any lab tests, please let us  know if you have not heard back within a few days. You may see your results on mychart before we have a chance to review them but we will give you a call once they are reviewed by us .   If we ordered any referrals today, please let us  know if you have not heard from their office within the next week.   If you had any urgent prescriptions sent in today, please check with the pharmacy within an hour of our visit to make sure the prescription was transmitted appropriately.   Please try these tips to maintain a healthy lifestyle:  Eat at least 3 REAL meals and 1-2 snacks per day.  Aim for no more than 5 hours between eating.  If you eat breakfast, please do so within one hour of getting up.   Each meal should contain half fruits/vegetables, one quarter protein, and one quarter carbs (no bigger than a computer mouse)  Cut down on sweet beverages. This includes juice, soda, and sweet tea.   Drink at least 1 glass of water with each meal and aim for at least 8 glasses per day  Exercise at least 150 minutes every week.

## 2024-01-05 NOTE — Assessment & Plan Note (Signed)
 He was on Ozempic however stopped this.  Insurance would not pay.  He is working on lifestyle interventions.  He will come back soon for physical and we will check A1c at that time.

## 2024-01-05 NOTE — Progress Notes (Signed)
   Wesley Hancock is a 53 y.o. male who presents today for an office visit.  Assessment/Plan:  Chronic Problems Addressed Today: ILD (interstitial lung disease) ([name]) Following with pulmonology.  On CellCept  1000 mcg twice daily and albuterol  as needed.  He will be following up with pulmonology soon.  Will complete his FMLA paperwork today.  SLE (systemic lupus erythematosus) ([name]) Following with rheum at allergy.  On CellCept .  Will complete FMLA paperwork today.  Dyslipidemia He will come back soon for annual physical later this year.  APS (antiphospholipid syndrome) ([name]) Anticoagulated on warfarin.  Prediabetes He was on Ozempic however stopped this.  Insurance would not pay.  He is working on lifestyle interventions.  He will come back soon for physical and we will check A1c at that time.     Subjective:  HPI:  See Assessment / plan for status of chronic conditions. Patient here today for follow up for form completion. He was last seen a few months ago.  Since her last visit he has follow-up with GI and pulmonology.  Also has been seeing rheumatology.  He has been diagnosed with chronic diarrhea related to his cholecystectomy.  Still having ongoing issues with his interstitial lung disease and is following with pulmonology for this as well.  Overall symptoms are stable today.       Objective:  Physical Exam: BP 108/70   Pulse 77   Temp (!) 97.4 F (36.3 C) (Temporal)   Ht 6' 2 (1.88 m)   Wt (!) 302 lb (137 kg)   SpO2 97%   BMI 38.77 kg/m   Gen: No acute distress, resting comfortably Neuro: Grossly normal, moves all extremities Psych: Normal affect and thought content      Allison Deshotels M. Kennyth, MD 01/05/2024 11:00 AM

## 2024-01-05 NOTE — Assessment & Plan Note (Signed)
 Following with pulmonology.  On CellCept  1000 mcg twice daily and albuterol  as needed.  He will be following up with pulmonology soon.  Will complete his FMLA paperwork today.

## 2024-01-05 NOTE — Assessment & Plan Note (Signed)
 Anticoagulated on warfarin.

## 2024-01-12 ENCOUNTER — Ambulatory Visit (INDEPENDENT_AMBULATORY_CARE_PROVIDER_SITE_OTHER): Payer: BC Managed Care – PPO

## 2024-01-12 DIAGNOSIS — Z7901 Long term (current) use of anticoagulants: Secondary | ICD-10-CM

## 2024-01-12 LAB — POCT INR: INR: 3.2 — AB (ref 2.0–3.0)

## 2024-01-12 NOTE — Progress Notes (Signed)
Continue 2 tablets daily except take 3 tablets on Mondays and Thursdays. Recheck in 6 weeks.

## 2024-01-12 NOTE — Progress Notes (Signed)
I have reviewed the patient's encounter and agree with the documentation.  Wesley Hancock. [name], MD 01/12/2024 9:22 AM

## 2024-01-12 NOTE — Patient Instructions (Addendum)
Pre visit review using our clinic review tool, if applicable. No additional management support is needed unless otherwise documented below in the visit note.  Continue 2 tablets daily except take 3 tablets on Mondays and Thursdays. Recheck in 6 weeks.

## 2024-01-13 ENCOUNTER — Telehealth: Payer: Self-pay | Admitting: Pulmonary Disease

## 2024-01-13 NOTE — Telephone Encounter (Signed)
Received FMLA form from [employer] on 2/3 - completed form and will have Dr. Francine Graven sign it when he is in office on 2/19.

## 2024-01-20 ENCOUNTER — Encounter: Payer: Self-pay | Admitting: Pulmonary Disease

## 2024-01-20 ENCOUNTER — Ambulatory Visit (HOSPITAL_BASED_OUTPATIENT_CLINIC_OR_DEPARTMENT_OTHER): Payer: BC Managed Care – PPO | Admitting: Pulmonary Disease

## 2024-01-20 ENCOUNTER — Ambulatory Visit: Payer: BC Managed Care – PPO | Admitting: Pulmonary Disease

## 2024-01-20 VITALS — BP 125/73 | HR 99 | Ht 74.0 in | Wt 306.0 lb

## 2024-01-20 DIAGNOSIS — M351 Other overlap syndromes: Secondary | ICD-10-CM

## 2024-01-20 DIAGNOSIS — D6861 Antiphospholipid syndrome: Secondary | ICD-10-CM

## 2024-01-20 DIAGNOSIS — J849 Interstitial pulmonary disease, unspecified: Secondary | ICD-10-CM

## 2024-01-20 LAB — PULMONARY FUNCTION TEST
DL/VA % pred: 111 %
DL/VA: 4.83 ml/min/mmHg/L
DLCO cor % pred: 87 %
DLCO cor: 28.93 ml/min/mmHg
DLCO unc % pred: 91 %
DLCO unc: 30.22 ml/min/mmHg
FEF 25-75 Post: 5.31 L/s
FEF 25-75 Pre: 4.38 L/s
FEF2575-%Change-Post: 21 %
FEF2575-%Pred-Post: 139 %
FEF2575-%Pred-Pre: 114 %
FEV1-%Change-Post: 3 %
FEV1-%Pred-Post: 86 %
FEV1-%Pred-Pre: 83 %
FEV1-Post: 3.81 L
FEV1-Pre: 3.68 L
FEV1FVC-%Change-Post: 0 %
FEV1FVC-%Pred-Pre: 113 %
FEV6-%Change-Post: 3 %
FEV6-%Pred-Post: 78 %
FEV6-%Pred-Pre: 75 %
FEV6-Post: 4.33 L
FEV6-Pre: 4.18 L
FEV6FVC-%Pred-Post: 103 %
FEV6FVC-%Pred-Pre: 103 %
FVC-%Change-Post: 3 %
FVC-%Pred-Post: 75 %
FVC-%Pred-Pre: 73 %
FVC-Post: 4.33 L
FVC-Pre: 4.18 L
Post FEV1/FVC ratio: 88 %
Post FEV6/FVC ratio: 100 %
Pre FEV1/FVC ratio: 88 %
Pre FEV6/FVC Ratio: 100 %
RV % pred: 56 %
RV: 1.28 L
TLC % pred: 74 %
TLC: 5.76 L

## 2024-01-20 MED ORDER — MYCOPHENOLATE MOFETIL 500 MG PO TABS: 1500.0000 mg | ORAL_TABLET | Freq: Two times a day (BID) | ORAL | 3 refills | Status: AC

## 2024-01-20 NOTE — Patient Instructions (Addendum)
 Continue cellcept 1,500mg  twice daily  Continue CPAP nightly  Your breathing tests are stable  Your CT Chest scan shows mild scattered inflammation, but better than before  Follow up in 4 months

## 2024-01-20 NOTE — Progress Notes (Signed)
 Synopsis: Pulmonary follow up for ILD diagnosis in 12/2020  Subjective:   PATIENT ID: Wesley Hancock GENDER: male DOB: [birth year], MRN: [id number]  HPI  Chief Complaint  Patient presents with   Follow-up   Wesley Hancock is a 53 year old male, former smoker with history of DVT/PE due to antiphospholipid syndrome diagnosed in September 2020 and later with SLE and mixed connective tissue disease with interstitial lung disease features in February 2022.   OV 04/22/21 He was started on steroid taper in February, initially on 60mg  daily with bactrim prophylaxis. He was tapered to 40mg  daily on 01/30/21 and started on mycophenolate 500mg  twice daily at that time. The mycophenolate was increased to 1g twice daily on 03/26/21. He has tolerated the mycophenolate well and his lab values remain in normal range. He is currently on 30mg  of prednisone daily and bactrim prophylaxis. He reports noticing some increased shortness of breath over the past 3-4 days. Denies fevers or chills.   He received his CPAP machine at the beginning of the month and has noticed improvement in his day time fatigue. He has been gaining weight since being on the steroid taper.  He is followed by Dr. Corliss Skains in rheumatology and Emeline Gins, NP in hematology. I have reviewed there notes from 5/19 and 5/20 respectively.  OV 05/27/21 He continues on 1g cellcept twice daily along with hydroxychloroquine for the mixed connective tissue disease and lupus. He has tapered down to 10mg  of prednisone daily without any increase in dyspnea. He has noticed an increase in intermittent back pain though. He had an episode of acute dyspnea over the last weekend with SpO2 into the low 90s. He rested for a couple days and then the episode resolved spontaneously. He denies any cough or wheezing. No skin rashes.  OV - 11/18/23 Patient was last seen in our office 05/14/22 by Rhunette Croft, NP. He was seen 09/28/23 by Dr. Corliss Skains. He had  been getting refills on cellcept from his primary care. He lost his health insurance which explains not being seen in our clinic over the past year plus.   He has been compliant with his medication regimen, including CellCept and Coumadin. He has been using a CPAP machine regularly for sleep apnea and reports that he struggles to fall asleep without it. Based on his app data, he is using CPAP 4 hours or more every night and has an AHI of 2 or less. He recently got a new nasal mask which has a good seal. The patient only has respiratory issues when he has a cold. These issues include wheezing and a feeling of gurgling in his chest. The patient has been managing these symptoms with albuterol and antibiotics prescribed by his primary care doctor. Otherwise he denies dry cough or progressive exertional dyspnea. The patient has also been dealing with weight issues and has been on tirzepatide, which helped him lose weight. However, he has gained some weight back after stopping the medication.  OV 01/20/24 He has been doing well since last visit.   CT Chest shows mild scattered ground glass opacities throughout both lungs. PFTs are stable to slightly improved compared to prior studies.   FEV1 3.81L (86%) FVC 4.33L (75%) Ratio 88, TLC 74% (5.76L), DLCO 87%   Past Medical History:  Diagnosis Date   Acute nonintractable headache 09/06/2019   Acute respiratory failure with hypoxia ([name]) 08/30/2019   AKI (acute kidney injury) ([name]) 08/30/2019   Antiphospholipid syndrome ([name]) 10/30/2019   Anxiety  APS (antiphospholipid syndrome) ([name])    Arthritis    Colon polyp    ? hyperplastic   Depression    Diarrhea 05/21/2020   DVT (deep venous thrombosis) ([name])    Dyspnea on exertion 09/08/2017   "Quit smoking" 2015 with onset of symptoms in 2016  Spirometry 09/08/2017  Flat f/v loop  - d/c acei 09/08/2017  - 01/04/2020   Walked RA x two laps =  approx 546ft @ fast pace - stopped due to end of study/ min sob  with sats of 93 % at the end of the study. - PFT's  03/08/20   FEV1 3.84 (83 % ) ratio 0..90  p 3 % improvement from saba p ? 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 06/26/20, 03/08/20   Snoring 07/09/2020   Stress fracture    in back, seeing PT   Stroke ([name])    Upper airway cough syndrome 01/05/2020   Onset mid Jan 2021 while on otc PPI  - max rx for gerd 01/04/2020 >>>      Witnessed apneic spells 07/09/2020     Family History  Problem Relation Age of Onset   Diabetes Mother    Hyperlipidemia Mother    Hypertension Mother    Thyroid disease Mother        uncertain type--had surg--no cancer   Asthma Mother        died from PNA   Heart disease Father 10   Hyperlipidemia Father    Hypertension Father    Prostate cancer Father 22   Lung cancer Father 57       Dx 06/18/2017   Colon polyps Father    Irritable bowel syndrome Father    Diverticulitis Father    Diabetes Sister    Hyperlipidemia Sister    Hypertension Sister    Diabetes Maternal Grandmother    Heart disease Maternal Grandmother    Hyperlipidemia Maternal Grandmother    Hypertension  Maternal Grandmother    Colon cancer Maternal Grandmother    Heart disease Maternal Grandfather    Hyperlipidemia Maternal Grandfather    Hypertension Maternal Grandfather    Stroke Maternal Grandfather    Liver cancer Maternal Grandfather    Irritable bowel syndrome Maternal Grandfather    Heart disease Paternal Grandmother    Healthy Daughter    Healthy Daughter    Healthy Son    Esophageal cancer Neg Hx    Stomach cancer Neg Hx    Rectal cancer Neg Hx      Social History   Socioeconomic History   Marital status: Married  Spouse name: Lauren   Number of children: 3   Years of education: Not on file   Highest education level: 12th grade  Occupational History   Occupation: Truck driver  Tobacco Use   Smoking status: Former    Current packs/day: 0.00    Average packs/day: 1 pack/day for 20.0 years (20.0 ttl pk-yrs)    Types: Cigarettes    Start date: 11/30/1993    Quit date: 11/30/2013    Years since quitting: 10.1    Passive exposure: Past   Smokeless tobacco: Former    Types: Snuff    Quit date: 05/21/2018  Vaping Use   Vaping status: Never Used  Substance and Sexual Activity   Alcohol use: Yes    Comment: occ   Drug use: No   Sexual activity: Yes  Other Topics Concern   Not on file  Social History Narrative   Lives with wife and daughter at home   Right Handed   Drinks about 2 cups caffeine daily   Social Drivers of Health   Financial Resource Strain: High Risk (01/04/2024)   Overall Financial Resource Strain (CARDIA)    Difficulty of Paying Living Expenses: Hard  Food Insecurity: Food Insecurity Present (01/04/2024)   Hunger Vital Sign    Worried About Running Out of Food in the Last Year: Sometimes true    Ran Out of Food in the Last Year: Never true  Transportation Needs: No Transportation Needs (01/04/2024)   PRAPARE - Administrator, Civil Service (Medical): No    Lack of Transportation (Non-Medical): No  Physical Activity: Sufficiently  Active (01/04/2024)   Exercise Vital Sign    Days of Exercise per Week: 4 days    Minutes of Exercise per Session: 40 min  Stress: Stress Concern Present (01/04/2024)   [employer] of Occupational Health - Occupational Stress Questionnaire    Feeling of Stress : Very much  Social Connections: Moderately Isolated (01/04/2024)   Social Connection and Isolation Panel [NHANES]    Frequency of Communication with Friends and Family: More than three times a week    Frequency of Social Gatherings with Friends and Family: Twice a week    Attends Religious Services: Never    Database administrator or Organizations: No    Attends Engineer, structural: Not on file    Marital Status: Married  Catering manager Violence: Not At Risk (10/04/2022)   Received from [employer], [facility]   HITS    Over the last 12 months how often did your partner physically hurt you?: Never    Over the last 12 months how often did your partner insult you or talk down to you?: Never    Over the last 12 months how often did your partner threaten you with physical harm?: Never    Over the last 12 months how often did your partner scream or curse at you?: Never     Allergies  Allergen Reactions   Diltiazem Hcl Diarrhea and Other (See Comments)    Lethargic      Outpatient Medications Prior to Visit  Medication Sig Dispense Refill   acetaminophen (TYLENOL) 500 MG tablet Take 1,000 mg by mouth every 6 (six) hours as needed for mild pain.     albuterol (VENTOLIN HFA) 108 (90 Base) MCG/ACT inhaler Inhale 2 puffs into the lungs every 6 (six) hours as needed for wheezing or shortness of breath. 8 g 6   buPROPion (WELLBUTRIN) 75 MG tablet TAKE  1 TABLET BY MOUTH TWICE A DAY 180 tablet 1   escitalopram (LEXAPRO) 20 MG tablet TAKE 1 TABLET BY MOUTH EVERY DAY 90 tablet 1   fluticasone (FLONASE) 50 MCG/ACT nasal spray Place 1 spray into both nostrils daily as needed for allergies.     hydrochlorothiazide  (HYDRODIURIL) 25 MG tablet Take 1 tablet (25 mg total) by mouth daily. 90 tablet 2   KLOR-CON M20 20 MEQ tablet TAKE 1 TABLET BY MOUTH EVERY DAY 90 tablet 1   Multiple Vitamin (MULTIVITAMIN) tablet Take by mouth daily. 2 chews daily     omeprazole (PRILOSEC) 20 MG capsule Take 20 mg by mouth 2 (two) times daily before a meal.     telmisartan (MICARDIS) 80 MG tablet TAKE 1 TABLET BY MOUTH EVERY DAY 90 tablet 2   triamcinolone cream (KENALOG) 0.1 % Apply to affected area 1-2 times daily 30 g 0   warfarin (COUMADIN) 5 MG tablet TAKE 2 TABLETS DAILY EXCEPT TAKE 3 TABLETS ON MONDAYS OR AS DIRECTED BY ANTICOAGULATION CLINIC 200 tablet 1   mycophenolate (CELLCEPT) 500 MG tablet TAKE 2 TABLETS BY MOUTH TWICE DAILY 360 tablet 1   nitroGLYCERIN (NITROSTAT) 0.4 MG SL tablet Place 1 tablet (0.4 mg total) under the tongue every 5 (five) minutes as needed for chest pain. 90 tablet 3   rosuvastatin (CRESTOR) 20 MG tablet Take 1 tablet (20 mg total) by mouth daily. 90 tablet 1   No facility-administered medications prior to visit.    Review of Systems  Constitutional:  Negative for chills, fever, malaise/fatigue and weight loss.  HENT:  Negative for congestion, sinus pain and sore throat.   Eyes: Negative.   Respiratory:  Negative for cough, hemoptysis, sputum production, shortness of breath and wheezing.   Cardiovascular:  Negative for chest pain, palpitations, orthopnea, claudication and leg swelling.  Gastrointestinal:  Negative for abdominal pain, heartburn, nausea and vomiting.  Genitourinary: Negative.   Musculoskeletal:  Negative for joint pain and myalgias.  Skin:  Negative for rash.  Neurological:  Negative for weakness.  Endo/Heme/Allergies: Negative.   Psychiatric/Behavioral: Negative.      Objective:   Vitals:   01/20/24 1319  BP: 125/73  Pulse: 99  SpO2: 97%  Weight: (!) 306 lb (138.8 kg)  Height: 6\' 2"  (1.88 m)    Physical Exam Constitutional:      General: He is not in acute  distress. HENT:     Head: Normocephalic and atraumatic.  Eyes:     Conjunctiva/sclera: Conjunctivae normal.  Cardiovascular:     Rate and Rhythm: Normal rate and regular rhythm.     Pulses: Normal pulses.     Heart sounds: Normal heart sounds. No murmur heard. Pulmonary:     Breath sounds: No rales.  Musculoskeletal:     Right lower leg: No edema.     Left lower leg: No edema.  Skin:    General: Skin is warm and dry.  Neurological:     General: No focal deficit present.     Mental Status: He is alert.    CBC    Component Value Date/Time   WBC 7.6 12/22/2023 1007   RBC 5.54 12/22/2023 1007   HGB 16.3 12/22/2023 1007   HGB 14.6 04/18/2021 0817   HCT 49.1 12/22/2023 1007   PLT 297.0 12/22/2023 1007   PLT 254 04/18/2021 0817   MCV 88.7 12/22/2023 1007   MCH 30.3 09/07/2023 1240   MCHC 33.1 12/22/2023 1007   RDW 15.2 12/22/2023 1007  LYMPHSABS 1.5 12/22/2023 1007   MONOABS 0.7 12/22/2023 1007   EOSABS 0.2 12/22/2023 1007   BASOSABS 0.1 12/22/2023 1007      Latest Ref Rng & Units 12/22/2023   10:07 AM 10/13/2023    9:21 AM 10/05/2023    1:41 PM  BMP  Glucose 70 - 99 mg/dL 045  409  811   BUN 6 - 23 mg/dL 17  16  12    Creatinine 0.40 - 1.50 mg/dL 9.14  7.82  9.56   Sodium 135 - 145 mEq/L 135  135  135   Potassium 3.5 - 5.1 mEq/L 4.0  4.3  3.2   Chloride 96 - 112 mEq/L 102  102  99   CO2 19 - 32 mEq/L 24  26  29    Calcium 8.4 - 10.5 mg/dL 9.4  9.6  9.3    Labs: 01/13/2021 ANA Positive dsDNA 177 IU/mL (0-9IU/mL) Ribnucleic Protien 2.4 (0- 0.9AI)  01/15/21 MyoMarker 3 Panel is negative  Chest imaging: HRCT Chest 12/15/23 1. Patchy pulmonary parenchymal ground-glass, ill-defined peribronchovascular ground-glass nodularity and music attenuation with cylindrical bronchiectasis and mild air trapping, findings which can be seen in the setting of non fibrotic hypersensitivity pneumonitis. Findings are suggestive of an alternative diagnosis (not UIP) per consensus  guidelines: Diagnosis of Idiopathic Pulmonary Fibrosis: An Official ATS/ERS/JRS/ALAT Clinical Practice Guideline. Am Rosezetta Schlatter Crit Care Med Vol 198, Iss 5, [phone], Jul 31 2017. 2. Age advanced three-vessel coronary artery calcification. 3. Low-attenuation thyroid nodules measure up to at least 2.7 cm. Previous thyroid ultrasound 01/07/2021 and biopsy 02/05/2021. No follow-up recommended unless clinically warranted. (Ref: J Am Coll Radiol. 2015 Feb;12(2): 143-50). 4. Hepatic steatosis.    CT Abd 09/07/23 Lower chest: Patchy and geographic ground-glass opacities in both lung bases. This is more diffuse than on prior exam.  CTA Chest 01/13/21 1. No demonstrable pulmonary embolus. No thoracic aortic aneurysm or dissection. There are foci of aortic atherosclerosis as well as foci of coronary artery calcification.   2. Extensive airspace opacity bilaterally with overall increased compared to December 2021. Areas of patchy consolidation noted in several areas. The overall appearance is most indicative of atypical organism pneumonia. A degree of bacterial superinfection is question. Note that there has been persistence of multifocal airspace opacity for several months. This circumstance may warrant Pulmonary Medicine consultation with consideration for bronchoscopy for further assessment with respect to etiology for the parenchymal lung lesions.   3. Stable adenopathy of uncertain etiology. Given the parenchymal lung changes, this adenopathy could have reactive etiology.   4. Hepatic steatosis. Liver appears prominent although incompletely visualized. Gallbladder absent.   5. Thyroid nodules with assessment by thyroid ultrasound 6 days prior. Please see recent thyroid ultrasound report with respect to thyroid nodular assessment.  PFT:    Latest Ref Rng & Units 01/20/2024   11:20 AM 03/25/2021    8:59 AM 03/08/2020    2:51 PM  PFT Results  FVC-Pre L 4.18  P 4.17  4.31    FVC-Predicted Pre % 73  P 70  72   FVC-Post L 4.33  P 4.02  4.29   FVC-Predicted Post % 75  P 68  72   Pre FEV1/FVC % % 88  P 88  86   Post FEV1/FCV % % 88  P 91  90   FEV1-Pre L 3.68  P 3.66  3.71   FEV1-Predicted Pre % 83  P 79  80   FEV1-Post L 3.81  P 3.67  3.84  DLCO uncorrected ml/min/mmHg 30.22  P 26.04  26.46   DLCO UNC% % 91  P 76  77   DLCO corrected ml/min/mmHg 28.93  P 25.42  26.46   DLCO COR %Predicted % 87  P 74  77   DLVA Predicted % 111  P 96  99   TLC L 5.76  P 6.11  5.98   TLC % Predicted % 74  P 77  76   RV % Predicted % 56  P 74  67     P Preliminary result  03/25/21 mild restrictive defect and mild diffusion defect  Echo 07/31/20:  1. Left ventricular ejection fraction, by estimation, is 60 to 65%. The  left ventricle has normal function. The left ventricle has no regional  wall motion abnormalities. There is mild left ventricular hypertrophy.  Left ventricular diastolic parameters  were normal.   2. Right ventricular systolic function is normal. The right ventricular  size is normal.   3. Left atrial size was mildly dilated.   4. The mitral valve is normal in structure. No evidence of mitral valve  regurgitation. No evidence of mitral stenosis.   5. The aortic valve is normal in structure. Aortic valve regurgitation is  not visualized. No aortic stenosis is present.   6. The inferior vena cava is normal in size with greater than 50%  respiratory variability, suggesting right atrial pressure of 3 mmHg.  NM Myocardial Stress Test 08/01/20 The left ventricular ejection fraction is normal (55-65%). Nuclear stress EF: 59%. There were no wall motion abnormalities There was no ST segment deviation noted during stress. The study is normal. This is a low risk study. No perfusion defects.  Assessment & Plan:   ILD (interstitial lung disease) ([name]) - Plan: mycophenolate (CELLCEPT) 500 MG tablet  Mixed connective tissue disease ([name])  APS (antiphospholipid  syndrome) ([name])  Discussion: Wesley Hancock is a 53 year old male, former smoker with history of DVT/PE due to antiphospholipid syndrome diagnosed in September 2020 who presented with shortness of breath and chest pain 01/13/21 with workup showing signs of interstitial lung disease related to lupus/mixed connective tissue overlap syndrome.   Interstitial Lung Disease (ILD) secondary to Mixed Connective Tissue Disease Recent CT Chest with mild ground glass opacities bilaterally. Stable pulmonary function tests. -Increase Cellcept to 1500mg  twice daily given ground glass on CT chest -Repeat labs in 3-4 months -Followed by Rheumatology for mixed connective tissue disease  Obstructive Sleep Apnea (OSA) Compliant with CPAP use, with good control of events per hour. Noted some wheezing with colds. -Continue CPAP use as currently prescribed.  Antiphospholipid syndrome with hx of DVT/PE Stable on Coumadin, with recent INR at 2.7. Regularly monitored by primary care. -Continue current regimen.  Follow-up in 4 months  Melody Comas, MD  Pulmonary & Critical Care Office: [phone]   Current Outpatient Medications:    acetaminophen (TYLENOL) 500 MG tablet, Take 1,000 mg by mouth every 6 (six) hours as needed for mild pain., Disp: , Rfl:    albuterol (VENTOLIN HFA) 108 (90 Base) MCG/ACT inhaler, Inhale 2 puffs into the lungs every 6 (six) hours as needed for wheezing or shortness of breath., Disp: 8 g, Rfl: 6   buPROPion (WELLBUTRIN) 75 MG tablet, TAKE 1 TABLET BY MOUTH TWICE A DAY, Disp: 180 tablet, Rfl: 1   escitalopram (LEXAPRO) 20 MG tablet, TAKE 1 TABLET BY MOUTH EVERY DAY, Disp: 90 tablet, Rfl: 1   fluticasone (FLONASE) 50 MCG/ACT nasal spray, Place 1 spray into  both nostrils daily as needed for allergies., Disp: , Rfl:    hydrochlorothiazide (HYDRODIURIL) 25 MG tablet, Take 1 tablet (25 mg total) by mouth daily., Disp: 90 tablet, Rfl: 2   KLOR-CON M20 20 MEQ tablet, TAKE 1  TABLET BY MOUTH EVERY DAY, Disp: 90 tablet, Rfl: 1   Multiple Vitamin (MULTIVITAMIN) tablet, Take by mouth daily. 2 chews daily, Disp: , Rfl:    omeprazole (PRILOSEC) 20 MG capsule, Take 20 mg by mouth 2 (two) times daily before a meal., Disp: , Rfl:    telmisartan (MICARDIS) 80 MG tablet, TAKE 1 TABLET BY MOUTH EVERY DAY, Disp: 90 tablet, Rfl: 2   triamcinolone cream (KENALOG) 0.1 %, Apply to affected area 1-2 times daily, Disp: 30 g, Rfl: 0   warfarin (COUMADIN) 5 MG tablet, TAKE 2 TABLETS DAILY EXCEPT TAKE 3 TABLETS ON MONDAYS OR AS DIRECTED BY ANTICOAGULATION CLINIC, Disp: 200 tablet, Rfl: 1   mycophenolate (CELLCEPT) 500 MG tablet, Take 3 tablets (1,500 mg total) by mouth 2 (two) times daily., Disp: 540 tablet, Rfl: 3   nitroGLYCERIN (NITROSTAT) 0.4 MG SL tablet, Place 1 tablet (0.4 mg total) under the tongue every 5 (five) minutes as needed for chest pain., Disp: 90 tablet, Rfl: 3   rosuvastatin (CRESTOR) 20 MG tablet, Take 1 tablet (20 mg total) by mouth daily., Disp: 90 tablet, Rfl: 1

## 2024-01-20 NOTE — Patient Instructions (Signed)
 Full PFT Performed Today

## 2024-01-20 NOTE — Progress Notes (Signed)
 Full PFT Performed Today

## 2024-01-25 NOTE — Telephone Encounter (Signed)
 Signed FMLA form faxed to [employer] - fax# [phone].  Included office notes from 01/20/2024 and 11/18/2023.

## 2024-02-20 IMAGING — CR DG CHEST 2V
2 series · 2 of 2 positions shown · non-contrast
Comparison: 10/30/2021

CLINICAL DATA: Chest pain

EXAM:
CHEST - 2 VIEW

[w chest pa]
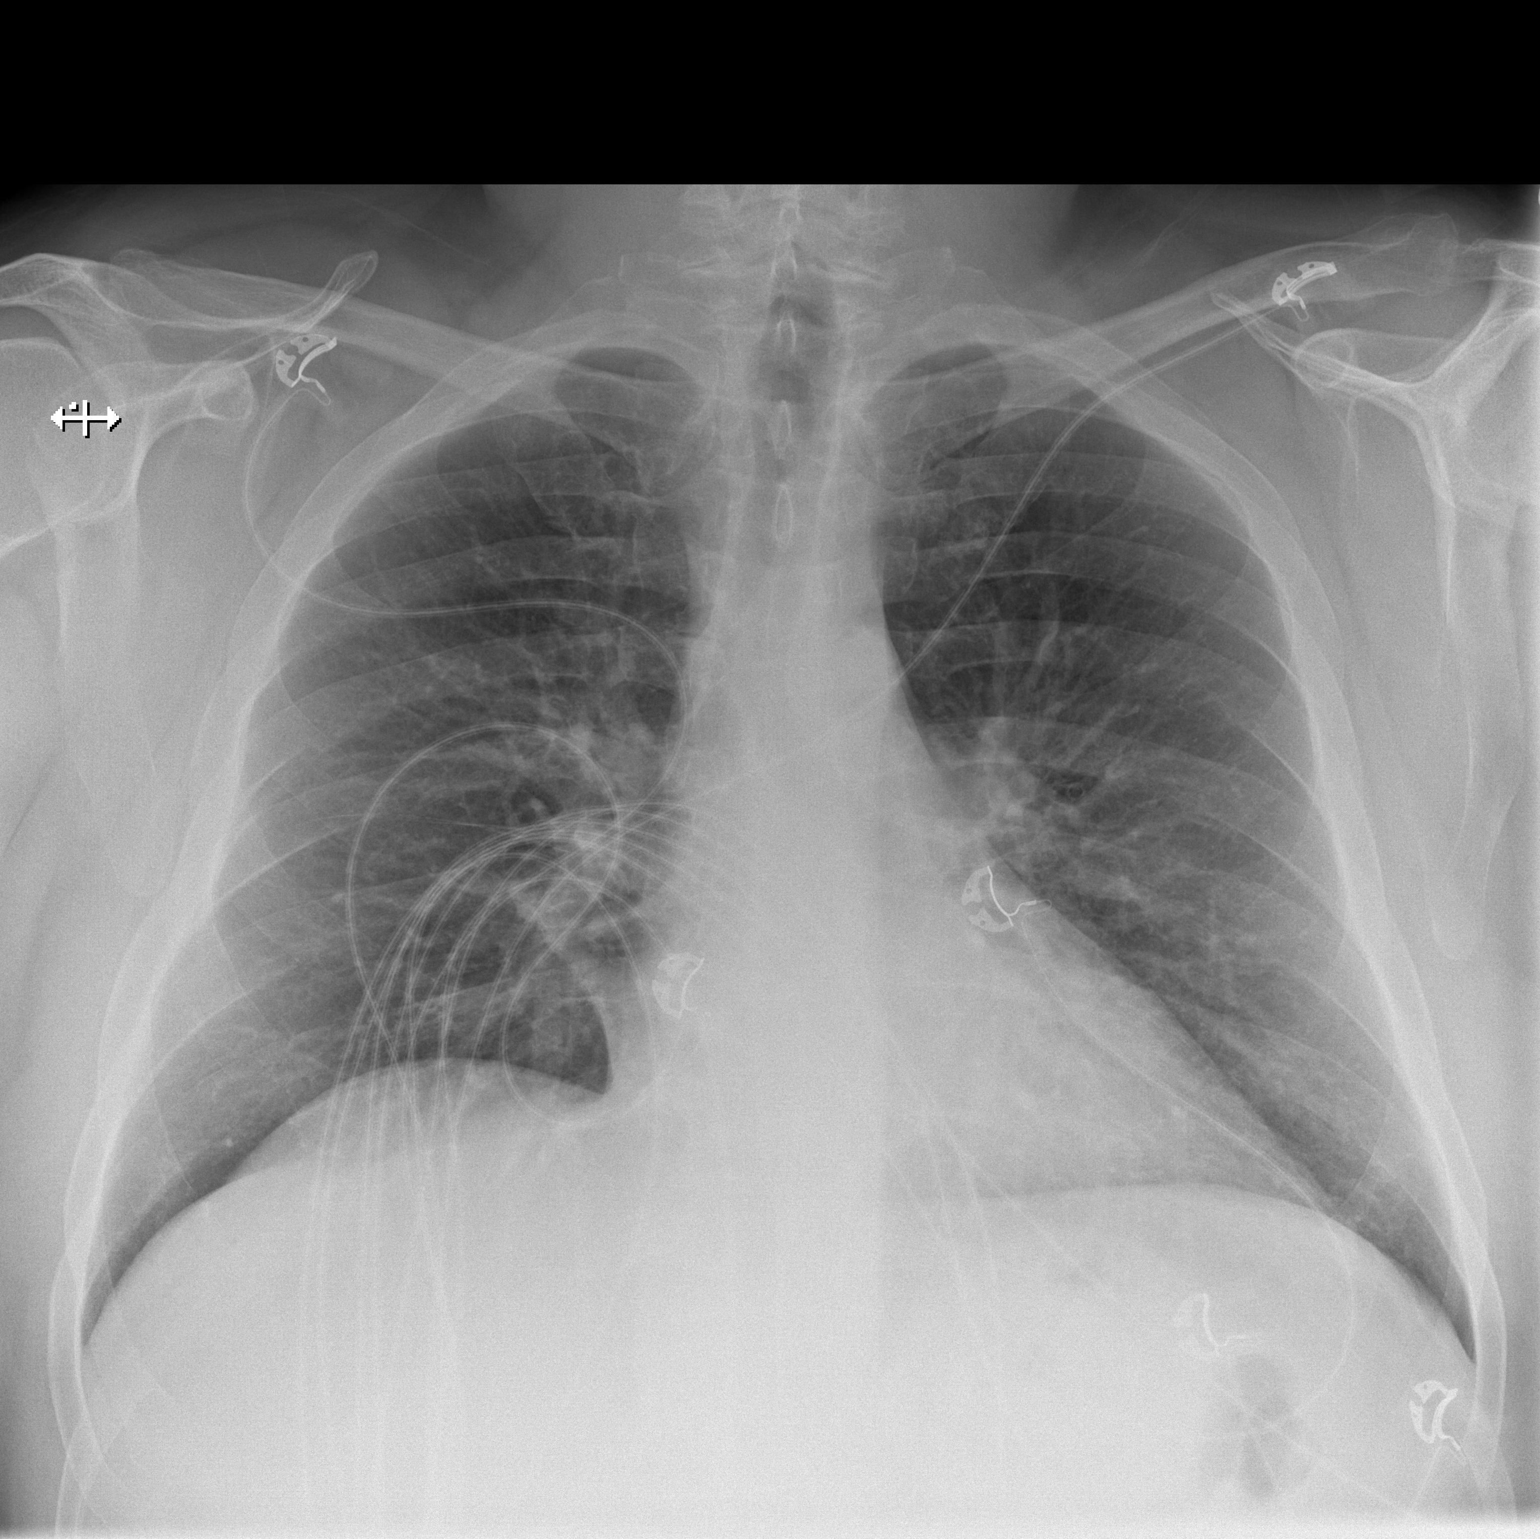

[w chest lat]
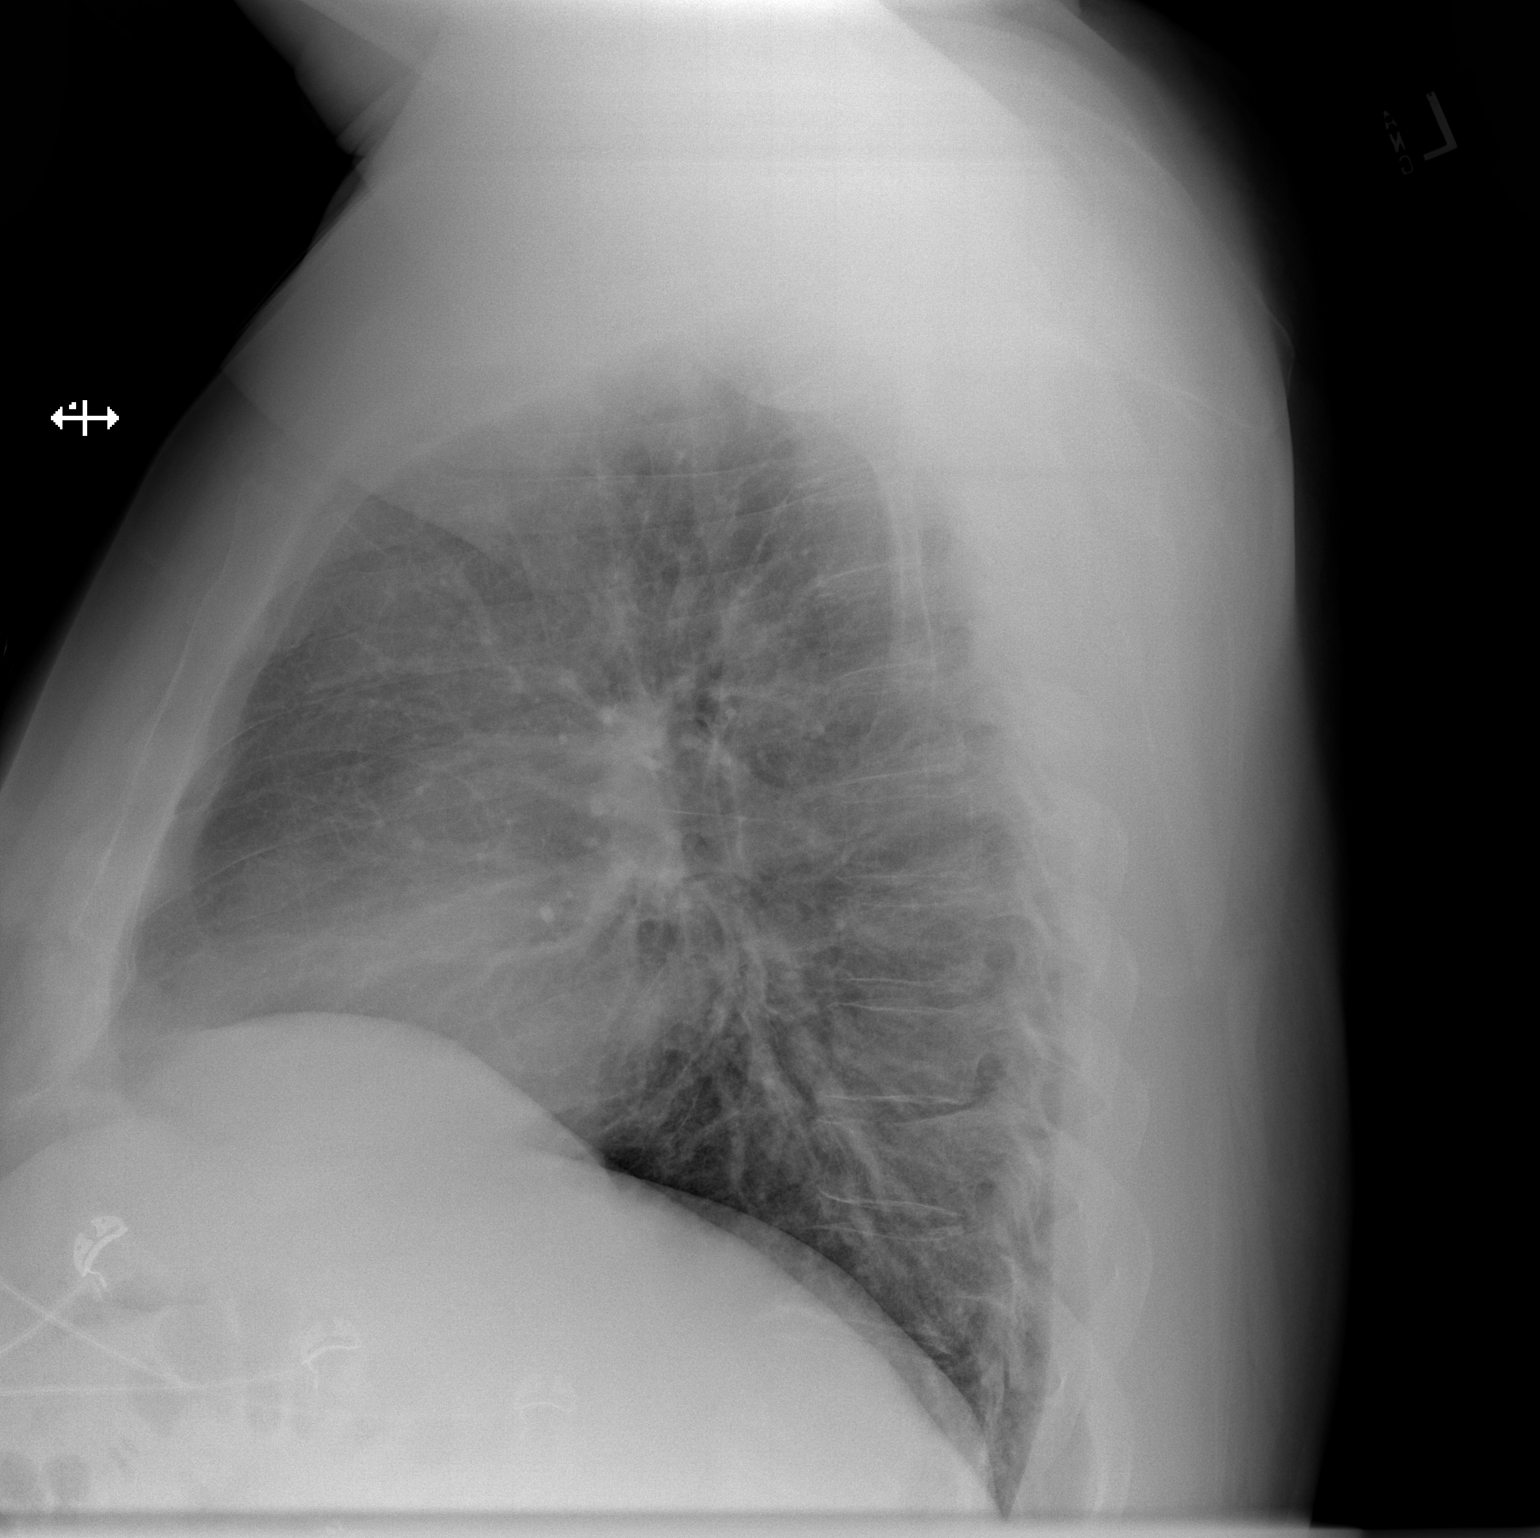

[2 of 2 positions shown; findings below may reference images not displayed]

FINDINGS: Artifact from EKG leads.

Normal heart size and mediastinal contours. No acute infiltrate or
edema. No effusion or pneumothorax. No acute osseous findings.
IMPRESSION: No active cardiopulmonary disease.

## 2024-02-22 NOTE — Progress Notes (Deleted)
 Office Visit Note  Patient: Wesley Hancock             Date of Birth: 02-Feb-1971           MRN: [id number]             PCP: Ardith Dark, MD Referring: Ardith Dark, MD Visit Date: 03/07/2024 Occupation: @GUAROCC @  Subjective:    History of Present Illness: Wesley Hancock is a 53 y.o. male with history of mixed connective tissue disease. Patient remains on CellCept 1 g by mouth twice daily as monotherapy.    CBC and CMP updated on 12/22/23.    Activities of Daily Living:  Patient reports morning stiffness for *** {minute/hour:19697}.   Patient {ACTIONS;DENIES/REPORTS:21021675::"Denies"} nocturnal pain.  Difficulty dressing/grooming: {ACTIONS;DENIES/REPORTS:21021675::"Denies"} Difficulty climbing stairs: {ACTIONS;DENIES/REPORTS:21021675::"Denies"} Difficulty getting out of chair: {ACTIONS;DENIES/REPORTS:21021675::"Denies"} Difficulty using hands for taps, buttons, cutlery, and/or writing: {ACTIONS;DENIES/REPORTS:21021675::"Denies"}  No Rheumatology ROS completed.   PMFS History:  Patient Active Problem List   Diagnosis Date Noted   Diverticulitis of colon 10/20/2023   Diverticulosis 09/14/2023   SLE (systemic lupus erythematosus) ([name]) 05/19/2023   Coronary artery disease 05/19/2023   GERD (gastroesophageal reflux disease) 05/19/2023   Vitamin D deficiency 10/09/2021   ILD (interstitial lung disease) ([name]) 05/27/2021   Chronic anticoagulation 03/26/2021   CVA (cerebral vascular accident) ([name]) 03/20/2021   Prediabetes 01/16/2021   OSA on CPAP 10/11/2020   APS (antiphospholipid syndrome) ([name])    Chronic diarrhea 05/21/2020   Morbid obesity ([name]) 09/10/2019   Depression 06/24/2017   Dyslipidemia 09/18/2015    Past Medical History:  Diagnosis Date   Acute nonintractable headache 09/06/2019   Acute respiratory failure with hypoxia ([name]) 08/30/2019   AKI (acute kidney injury) ([name]) 08/30/2019   Antiphospholipid syndrome ([name]) 10/30/2019   Anxiety     APS (antiphospholipid syndrome) ([name])    Arthritis    Colon polyp    ? hyperplastic   Depression    Diarrhea 05/21/2020   DVT (deep venous thrombosis) ([name])    Dyspnea on exertion 09/08/2017   "Quit smoking" 2015 with onset of symptoms in 2016  Spirometry 09/08/2017  Flat f/v loop  - d/c acei 09/08/2017  - 01/04/2020   Walked RA x two laps =  approx 551ft @ fast pace - stopped due to end of study/ min sob with sats of 93 % at the end of the study. - PFT's  03/08/20   FEV1 3.84 (83 % ) ratio 0..90  p 3 % improvement from saba p ? 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 06/26/20, 03/08/20   Snoring 07/09/2020  Stress fracture    in back, seeing PT   Stroke ([name])    Upper airway cough syndrome 01/05/2020   Onset mid Jan 2021 while on otc PPI  - max rx for gerd 01/04/2020 >>>      Witnessed apneic spells 07/09/2020    Family History  Problem Relation Age of Onset   Diabetes Mother    Hyperlipidemia Mother    Hypertension Mother    Thyroid disease Mother        uncertain type--had surg--no cancer   Asthma Mother        died from PNA   Heart disease Father 27    Hyperlipidemia Father    Hypertension Father    Prostate cancer Father 56   Lung cancer Father 73       Dx 06/18/2017   Colon polyps Father    Irritable bowel syndrome Father    Diverticulitis Father    Diabetes Sister    Hyperlipidemia Sister    Hypertension Sister    Diabetes Maternal Grandmother    Heart disease Maternal Grandmother    Hyperlipidemia Maternal Grandmother    Hypertension Maternal Grandmother    Colon cancer Maternal Grandmother    Heart disease Maternal Grandfather    Hyperlipidemia Maternal Grandfather    Hypertension Maternal Grandfather    Stroke Maternal Grandfather    Liver cancer Maternal Grandfather    Irritable bowel syndrome Maternal Grandfather    Heart disease Paternal Grandmother    Healthy Daughter    Healthy Daughter    Healthy Son    Esophageal cancer Neg Hx    Stomach cancer Neg Hx    Rectal cancer Neg Hx    Past Surgical History:  Procedure Laterality Date   BRONCHIAL WASHINGS  01/14/2021   Procedure: BRONCHIAL WASHINGS;  Surgeon: Martina Sinner, MD;  Location: [facility];  Service: Pulmonary;;   CHOLECYSTECTOMY     VIDEO BRONCHOSCOPY N/A 01/14/2021   Procedure: VIDEO BRONCHOSCOPY WITHOUT FLUORO;  Surgeon: Martina Sinner, MD;  Location: [facility];  Service: Pulmonary;  Laterality: N/A;   Social History   Social History Narrative   Lives with wife and daughter at home   Right Handed   Drinks about 2 cups caffeine daily   Immunization History  Administered Date(s) Administered   Fluzone Influenza virus vaccine,trivalent (IIV3), split virus 09/18/2015, 09/14/2016   Influenza Inj Mdck Quad Pf 12/13/2020   Influenza, Seasonal, Injecte, Preservative Fre 09/04/2023   Influenza,inj,Quad PF,6+ Mos 09/14/2016, 09/08/2017, 09/06/2019, 10/07/2021, 09/14/2022   Influenza,inj,quad, With Preservative 09/18/2015   PFIZER(Purple Top)SARS-COV-2 Vaccination 02/19/2020, 03/04/2020, 12/13/2020, 07/03/2021   PNEUMOCOCCAL CONJUGATE-20  04/27/2022   Pneumococcal Polysaccharide-23 09/04/2023   Td 11/11/2016   Tdap 02/28/2019   Zoster Recombinant(Shingrix) 04/27/2022     Objective: Vital Signs: There were no vitals taken for this visit.   Physical Exam Vitals and nursing note reviewed.  Constitutional:      Appearance: He is well-developed.  HENT:     Head: Normocephalic and atraumatic.  Eyes:     Conjunctiva/sclera: Conjunctivae normal.     Pupils: Pupils are equal, round, and reactive to light.  Cardiovascular:     Rate and Rhythm: Normal rate and regular rhythm.     Heart sounds: Normal heart sounds.  Pulmonary:     Effort: Pulmonary effort is normal.     Breath sounds: Normal breath sounds.  Abdominal:     General: Bowel sounds are normal.     Palpations: Abdomen is soft.  Musculoskeletal:     Cervical back: Normal range of motion and neck supple.  Skin:    General: Skin is warm and dry.     Capillary Refill: Capillary refill takes less than 2 seconds.  Neurological:     Mental Status: He is alert and oriented to person, place, and time.  Psychiatric:        Behavior: Behavior normal.      Musculoskeletal Exam: ***  CDAI Exam: CDAI Score: -- Patient Global: --; Provider Global: -- Swollen: --; Tender: -- Joint Exam 03/07/2024   No joint exam has been documented for this visit   There is currently no information documented on the homunculus. Go to the Rheumatology activity and complete the homunculus joint exam.  Investigation: No additional findings.  Imaging: No results found.  Recent Labs: Lab Results  Component Value Date   WBC 7.6 12/22/2023   HGB 16.3 12/22/2023   PLT 297.0 12/22/2023   NA 135 12/22/2023   K 4.0 12/22/2023   CL 102 12/22/2023   CO2 24 12/22/2023   GLUCOSE 102 (H) 12/22/2023   BUN 17 12/22/2023   CREATININE 0.96 12/22/2023   BILITOT 0.6 12/22/2023   ALKPHOS 51 12/22/2023   AST 24 12/22/2023   ALT 38 12/22/2023   PROT 8.3 12/22/2023   ALBUMIN 4.4  12/22/2023   CALCIUM 9.4 12/22/2023   GFRAA 84 10/14/2020   QFTBGOLDPLUS Negative 01/15/2021    Speciality Comments: Plaquenil 200 mg p.o. twice daily started March 26, 2021  Procedures:  No procedures performed Allergies: Diltiazem hcl   Assessment / Plan:     Visit Diagnoses: Mixed connective tissue disease ([name])  ILD (interstitial lung disease) ([name])  APS (antiphospholipid syndrome) ([name])  High risk medication use  Chronic midline low back pain with bilateral sciatica  Pain in both hands  Trochanteric bursitis of both hips  Cerebrovascular accident (CVA), unspecified mechanism ([name])  Deep vein thrombosis (DVT) of right lower extremity, unspecified chronicity, unspecified vein ([name])  Multiple pulmonary emboli ([name])  Pulmonary infiltrates  Multinodular goiter  Hypertensive heart disease without heart failure  Nutcracker esophagus  Other fatigue  Mixed hyperlipidemia  GAD (generalized anxiety disorder)  Esophageal spasm  OSA on CPAP  Orders: No orders of the defined types were placed in this encounter.  No orders of the defined types were placed in this encounter.   Face-to-face time spent with patient was *** minutes. Greater than 50% of time was spent in counseling and coordination of care.  Follow-Up Instructions: No follow-ups on file.   Gearldine Bienenstock, PA-C  Note - This record has been created using Dragon software.  Chart creation errors have been sought, but may not always  have been located. Such creation errors do not reflect on  the standard of medical care.

## 2024-02-23 ENCOUNTER — Ambulatory Visit (INDEPENDENT_AMBULATORY_CARE_PROVIDER_SITE_OTHER): Payer: BC Managed Care – PPO

## 2024-02-23 DIAGNOSIS — Z7901 Long term (current) use of anticoagulants: Secondary | ICD-10-CM

## 2024-02-23 LAB — POCT INR: INR: 3.6 — AB (ref 2.0–3.0)

## 2024-02-23 NOTE — Patient Instructions (Addendum)
 Pre visit review using our clinic review tool, if applicable. No additional management support is needed unless otherwise documented below in the visit note.  Reduce dose today to take 1 tablet and then continue 2 tablets daily except take 3 tablets on Mondays and Thursdays. Recheck in 3 weeks.

## 2024-02-23 NOTE — Progress Notes (Signed)
 Pt reports upper respiratory  congestion and has been taking Dayquil for it. Pt reports less vitamin K containing foods. Also reports coughing up very small amount of blood two days ago, and none since. He reports his pulmonologist advised him this could happen from his interstitial lung disease and is not abnormal, but if he coughed up a lot of blood to go to the ER. Pt denies any other changes. Advised pt if this occurs again to contact PCP or pulmonologist office. Pt verbalized understanding.  Reduce dose today to take 1 tablet and then continue 2 tablets daily except take 3 tablets on Mondays and Thursdays. Recheck in 3 weeks.

## 2024-03-07 ENCOUNTER — Ambulatory Visit: Payer: BC Managed Care – PPO | Admitting: Physician Assistant

## 2024-03-07 DIAGNOSIS — M351 Other overlap syndromes: Secondary | ICD-10-CM

## 2024-03-07 DIAGNOSIS — R5383 Other fatigue: Secondary | ICD-10-CM

## 2024-03-07 DIAGNOSIS — E782 Mixed hyperlipidemia: Secondary | ICD-10-CM

## 2024-03-07 DIAGNOSIS — I2699 Other pulmonary embolism without acute cor pulmonale: Secondary | ICD-10-CM

## 2024-03-07 DIAGNOSIS — E042 Nontoxic multinodular goiter: Secondary | ICD-10-CM

## 2024-03-07 DIAGNOSIS — R918 Other nonspecific abnormal finding of lung field: Secondary | ICD-10-CM

## 2024-03-07 DIAGNOSIS — J849 Interstitial pulmonary disease, unspecified: Secondary | ICD-10-CM

## 2024-03-07 DIAGNOSIS — G8929 Other chronic pain: Secondary | ICD-10-CM

## 2024-03-07 DIAGNOSIS — Z79899 Other long term (current) drug therapy: Secondary | ICD-10-CM

## 2024-03-07 DIAGNOSIS — I119 Hypertensive heart disease without heart failure: Secondary | ICD-10-CM

## 2024-03-07 DIAGNOSIS — I82401 Acute embolism and thrombosis of unspecified deep veins of right lower extremity: Secondary | ICD-10-CM

## 2024-03-07 DIAGNOSIS — F411 Generalized anxiety disorder: Secondary | ICD-10-CM

## 2024-03-07 DIAGNOSIS — D6861 Antiphospholipid syndrome: Secondary | ICD-10-CM

## 2024-03-07 DIAGNOSIS — I639 Cerebral infarction, unspecified: Secondary | ICD-10-CM

## 2024-03-07 DIAGNOSIS — M79641 Pain in right hand: Secondary | ICD-10-CM

## 2024-03-07 DIAGNOSIS — K224 Dyskinesia of esophagus: Secondary | ICD-10-CM

## 2024-03-07 DIAGNOSIS — M7061 Trochanteric bursitis, right hip: Secondary | ICD-10-CM

## 2024-03-07 DIAGNOSIS — G4733 Obstructive sleep apnea (adult) (pediatric): Secondary | ICD-10-CM

## 2024-03-15 ENCOUNTER — Ambulatory Visit (INDEPENDENT_AMBULATORY_CARE_PROVIDER_SITE_OTHER)

## 2024-03-15 DIAGNOSIS — Z7901 Long term (current) use of anticoagulants: Secondary | ICD-10-CM | POA: Diagnosis not present

## 2024-03-15 LAB — POCT INR: INR: 4.5 — AB (ref 2.0–3.0)

## 2024-03-15 NOTE — Patient Instructions (Addendum)
 Pre visit review using our clinic review tool, if applicable. No additional management support is needed unless otherwise documented below in the visit note.  Hold dose today and then continue 2 tablets daily except take 3 tablets on Mondays and Thursdays. Recheck in 2 weeks.

## 2024-03-15 NOTE — Progress Notes (Signed)
 Pt has taken naproxen for four days starting 4/11 and stopped taking it on 4/15. Pt reports he was advised by a provider he needs to take this because Tylenol was not working for back pain. Pt is feeling better now.  Due to the interaction with warfarin and naproxen and pt being therapeutic on this dose for the last few INR checks, there will not be a change in weekly dose. Hold dose today and then continue 2 tablets daily except take 3 tablets on Mondays and Thursdays. Recheck in 2 weeks.

## 2024-03-29 ENCOUNTER — Ambulatory Visit (INDEPENDENT_AMBULATORY_CARE_PROVIDER_SITE_OTHER)

## 2024-03-29 DIAGNOSIS — Z7901 Long term (current) use of anticoagulants: Secondary | ICD-10-CM | POA: Diagnosis not present

## 2024-03-29 LAB — POCT INR: INR: 2.2 (ref 2.0–3.0)

## 2024-03-29 NOTE — Progress Notes (Signed)
 Pt reports last Thursday he forgot he was supposed to take 3 tablets and only took 2 tablets. Increase dose today to take 3 tablets and then continue 2 tablets daily except take 3 tablets on Mondays and Thursdays. Recheck in 3 weeks.

## 2024-03-29 NOTE — Progress Notes (Signed)
 I have reviewed the patient's encounter and agree with the documentation.  Wesley Hancock. [name], MD 03/29/2024 8:58 AM

## 2024-03-29 NOTE — Patient Instructions (Addendum)
 Pre visit review using our clinic review tool, if applicable. No additional management support is needed unless otherwise documented below in the visit note.  Increase dose today to take 3 tablets and then continue 2 tablets daily except take 3 tablets on Mondays and Thursdays. Recheck in 3 weeks.

## 2024-04-19 ENCOUNTER — Ambulatory Visit (INDEPENDENT_AMBULATORY_CARE_PROVIDER_SITE_OTHER)

## 2024-04-19 DIAGNOSIS — Z7901 Long term (current) use of anticoagulants: Secondary | ICD-10-CM

## 2024-04-19 LAB — POCT INR: INR: 2.8 (ref 2.0–3.0)

## 2024-04-19 NOTE — Patient Instructions (Addendum)
 Pre visit review using our clinic review tool, if applicable. No additional management support is needed unless otherwise documented below in the visit note.  Continue 2 tablets daily except take 3 tablets on Mondays and Thursdays. Recheck in 4 weeks.

## 2024-04-19 NOTE — Progress Notes (Signed)
 Continue 2 tablets daily except take 3 tablets on Mondays and Thursdays. Recheck in 4 weeks.

## 2024-04-19 NOTE — Progress Notes (Signed)
 I have reviewed the patient's encounter and agree with the documentation.  Wesley Hancock. [name], MD 04/19/2024 9:33 AM

## 2024-05-05 ENCOUNTER — Ambulatory Visit: Admitting: Family Medicine

## 2024-05-14 ENCOUNTER — Encounter (HOSPITAL_BASED_OUTPATIENT_CLINIC_OR_DEPARTMENT_OTHER): Payer: Self-pay | Admitting: Emergency Medicine

## 2024-05-14 ENCOUNTER — Emergency Department (HOSPITAL_BASED_OUTPATIENT_CLINIC_OR_DEPARTMENT_OTHER)
Admission: EM | Admit: 2024-05-14 | Discharge: 2024-05-14 | Disposition: A | Discharge status: Home / Self Care | Attending: Emergency Medicine | Admitting: Emergency Medicine

## 2024-05-14 ENCOUNTER — Other Ambulatory Visit: Payer: Self-pay

## 2024-05-14 ENCOUNTER — Emergency Department (HOSPITAL_BASED_OUTPATIENT_CLINIC_OR_DEPARTMENT_OTHER)

## 2024-05-14 DIAGNOSIS — I10 Essential (primary) hypertension: Secondary | ICD-10-CM | POA: Diagnosis not present

## 2024-05-14 DIAGNOSIS — Z8673 Personal history of transient ischemic attack (TIA), and cerebral infarction without residual deficits: Secondary | ICD-10-CM | POA: Diagnosis not present

## 2024-05-14 DIAGNOSIS — E161 Other hypoglycemia: Secondary | ICD-10-CM | POA: Diagnosis not present

## 2024-05-14 DIAGNOSIS — M4856XA Collapsed vertebra, not elsewhere classified, lumbar region, initial encounter for fracture: Secondary | ICD-10-CM | POA: Diagnosis not present

## 2024-05-14 DIAGNOSIS — M5136 Other intervertebral disc degeneration, lumbar region with discogenic back pain only: Secondary | ICD-10-CM | POA: Diagnosis not present

## 2024-05-14 DIAGNOSIS — M545 Low back pain, unspecified: Secondary | ICD-10-CM | POA: Insufficient documentation

## 2024-05-14 DIAGNOSIS — E162 Hypoglycemia, unspecified: Secondary | ICD-10-CM | POA: Diagnosis not present

## 2024-05-14 DIAGNOSIS — M5459 Other low back pain: Secondary | ICD-10-CM | POA: Diagnosis not present

## 2024-05-14 DIAGNOSIS — I700 Atherosclerosis of aorta: Secondary | ICD-10-CM | POA: Diagnosis not present

## 2024-05-14 DIAGNOSIS — Z9049 Acquired absence of other specified parts of digestive tract: Secondary | ICD-10-CM | POA: Diagnosis not present

## 2024-05-14 HISTORY — DX: Interstitial pulmonary disease, unspecified: J84.9

## 2024-05-14 MED ORDER — HYDROMORPHONE HCL 1 MG/ML IJ SOLN
0.5000 mg | Freq: Once | INTRAMUSCULAR | Status: AC
  Administered 2024-05-14: 0.5 mg via INTRAVENOUS
  Filled 2024-05-14: qty 1

## 2024-05-14 MED ORDER — OXYCODONE HCL 5 MG PO TABS: 2.5000 mg | ORAL_TABLET | Freq: Four times a day (QID) | ORAL | 0 refills | Status: DC | PRN

## 2024-05-14 MED ORDER — CYCLOBENZAPRINE HCL 10 MG PO TABS: 5.0000 mg | ORAL_TABLET | Freq: Two times a day (BID) | ORAL | 0 refills | Status: DC | PRN

## 2024-05-14 MED ORDER — DIAZEPAM 2 MG PO TABS
2.0000 mg | ORAL_TABLET | Freq: Once | ORAL | Status: AC
  Administered 2024-05-14: 2 mg via ORAL
  Filled 2024-05-14: qty 1

## 2024-05-14 MED ORDER — METHYLPREDNISOLONE 4 MG PO TBPK: ORAL_TABLET | ORAL | 0 refills | Status: DC

## 2024-05-14 MED ORDER — DEXAMETHASONE SODIUM PHOSPHATE 10 MG/ML IJ SOLN
10.0000 mg | Freq: Once | INTRAMUSCULAR | Status: AC
Start: 2024-05-14 — End: 2024-05-14
  Administered 2024-05-14: 10 mg via INTRAVENOUS
  Filled 2024-05-14: qty 1

## 2024-05-14 MED ORDER — KETOROLAC TROMETHAMINE 30 MG/ML IJ SOLN
30.0000 mg | Freq: Once | INTRAMUSCULAR | Status: AC
Start: 2024-05-14 — End: 2024-05-14
  Administered 2024-05-14: 30 mg via INTRAVENOUS
  Filled 2024-05-14: qty 1

## 2024-05-14 NOTE — ED Notes (Signed)
 Transport to ct

## 2024-05-14 NOTE — ED Triage Notes (Signed)
 Pt states he has degenerative disc disease and some compression fractures in his lumbar ,lives with pain daily. Today he was bending over and suddenly could not stand due to pain. His only tolerable position is supine.  Brought in by [facility]

## 2024-05-14 NOTE — Discharge Instructions (Signed)
Contact a health care provider if:  You have pain that is not relieved with rest or medicine.  You have increasing pain going down into your legs or buttocks.  Your pain does not improve after 2 weeks.  You have pain at night.  You lose weight without trying.  You have a fever or chills.  You develop nausea or vomiting.  You develop abdominal pain.  Get help right away if:  You develop new bowel or bladder control problems.  You have unusual weakness or numbness in your arms or legs.  You feel faint.  These symptoms may represent a serious problem that is an emergency. Do not wait to see if the symptoms will go away. Get medical help right away. Call your local emergency services (911 in the [place] Do not drive yourself to the hospital.

## 2024-05-14 NOTE — ED Triage Notes (Signed)
 Pt BIB [facility] reporting back pain after bending over to pick something up today, hx chronic back pain + compression frx, given fentanyl  PTA with improvement.

## 2024-05-15 ENCOUNTER — Ambulatory Visit: Payer: BC Managed Care – PPO | Admitting: Pulmonary Disease

## 2024-05-15 NOTE — Progress Notes (Deleted)
 Synopsis: Pulmonary follow up for ILD diagnosis in 12/2020  Subjective:   PATIENT ID: Wesley Hancock GENDER: male DOB: 12-05-1970, MRN: [id number]  HPI  No chief complaint on file.  Wesley Hancock is a 53 year old male, former smoker with history of DVT/PE due to antiphospholipid syndrome diagnosed in September 2020 and later with SLE and mixed connective tissue disease with interstitial lung disease features in February 2022.   OV 04/22/21 He was started on steroid taper in February, initially on 60mg  daily with bactrim  prophylaxis. He was tapered to 40mg  daily on 01/30/21 and started on mycophenolate  500mg  twice daily at that time. The mycophenolate  was increased to 1g twice daily on 03/26/21. He has tolerated the mycophenolate  well and his lab values remain in normal range. He is currently on 30mg  of prednisone  daily and bactrim  prophylaxis. He reports noticing some increased shortness of breath over the past 3-4 days. Denies fevers or chills.   He received his CPAP machine at the beginning of the month and has noticed improvement in his day time fatigue. He has been gaining weight since being on the steroid taper.  He is followed by Dr. Alvira Josephs in rheumatology and Reubin Castillo, NP in hematology. I have reviewed there notes from 5/19 and 5/20 respectively.  OV 05/27/21 He continues on 1g cellcept  twice daily along with hydroxychloroquine  for the mixed connective tissue disease and lupus. He has tapered down to 10mg  of prednisone  daily without any increase in dyspnea. He has noticed an increase in intermittent back pain though. He had an episode of acute dyspnea over the last weekend with SpO2 into the low 90s. He rested for a couple days and then the episode resolved spontaneously. He denies any cough or wheezing. No skin rashes.  OV - 11/18/23 Patient was last seen in our office 05/14/22 by Canary Ceo, NP. He was seen 09/28/23 by Dr. Alvira Josephs. He had been getting refills on  cellcept  from his primary care. He lost his health insurance which explains not being seen in our clinic over the past year plus.   He has been compliant with his medication regimen, including CellCept  and Coumadin . He has been using a CPAP machine regularly for sleep apnea and reports that he struggles to fall asleep without it. Based on his app data, he is using CPAP 4 hours or more every night and has an AHI of 2 or less. He recently got a new nasal mask which has a good seal. The patient only has respiratory issues when he has a cold. These issues include wheezing and a feeling of gurgling in his chest. The patient has been managing these symptoms with albuterol  and antibiotics prescribed by his primary care doctor. Otherwise he denies dry cough or progressive exertional dyspnea. The patient has also been dealing with weight issues and has been on tirzepatide, which helped him lose weight. However, he has gained some weight back after stopping the medication.  OV 01/20/24 He has been doing well since last visit.   CT Chest shows mild scattered ground glass opacities throughout both lungs. PFTs are stable to slightly improved compared to prior studies.   FEV1 3.81L (86%) FVC 4.33L (75%) Ratio 88, TLC 74% (5.76L), DLCO 87%  OV 05/15/24   Past Medical History:  Diagnosis Date   Acute nonintractable headache 09/06/2019   Acute respiratory failure with hypoxia ([name]) 08/30/2019   AKI (acute kidney injury) ([name]) 08/30/2019   Antiphospholipid syndrome ([name]) 10/30/2019   Anxiety    APS (  antiphospholipid syndrome) ([name])    Arthritis    Colon polyp    ? hyperplastic   Depression    Diarrhea 05/21/2020   DVT (deep venous thrombosis) ([name])    Dyspnea on exertion 09/08/2017   Quit smoking 2015 with onset of symptoms in 2016  Spirometry 09/08/2017  Flat f/v loop  - d/c acei 09/08/2017  - 01/04/2020   Walked RA x two laps =  approx 526ft @ fast pace - stopped due to end of study/ min sob with sats of  93 % at the end of the study. - PFT's  03/08/20   FEV1 3.84 (83 % ) ratio 0..90  p 3 % improvement from saba p ? 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 06/26/20, 03/08/20   Snoring 07/09/2020   Stress fracture    in back, seeing PT   Stroke ([name])    Upper airway cough syndrome 01/05/2020   Onset mid Jan 2021 while on otc PPI  - max rx for gerd 01/04/2020 >>>      Witnessed apneic spells 07/09/2020     Family History  Problem Relation Age of Onset   Diabetes Mother    Hyperlipidemia Mother    Hypertension Mother    Thyroid  disease Mother        uncertain type--had surg--no cancer   Asthma Mother        died from PNA   Heart disease Father 71   Hyperlipidemia Father    Hypertension Father    Prostate cancer Father 19   Lung cancer Father 56       Dx 06/18/2017   Colon polyps Father    Irritable bowel syndrome Father    Diverticulitis Father    Diabetes Sister    Hyperlipidemia Sister    Hypertension Sister    Diabetes Maternal Grandmother    Heart disease Maternal Grandmother    Hyperlipidemia Maternal Grandmother     Hypertension Maternal Grandmother    Colon cancer Maternal Grandmother    Heart disease Maternal Grandfather    Hyperlipidemia Maternal Grandfather    Hypertension Maternal Grandfather    Stroke Maternal Grandfather    Liver cancer Maternal Grandfather    Irritable bowel syndrome Maternal Grandfather    Heart disease Paternal Grandmother    Healthy Daughter    Healthy Daughter    Healthy Son    Esophageal cancer Neg Hx    Stomach cancer Neg Hx    Rectal cancer Neg Hx      Social History   Socioeconomic History  Marital status: Married    Spouse name: Lauren   Number of children: 3   Years of education: Not on file   Highest education level: 12th grade  Occupational History   Occupation: Truck driver  Tobacco Use   Smoking status: Former    Current packs/day: 0.00    Average packs/day: 1 pack/day for 20.0 years (20.0 ttl pk-yrs)    Types: Cigarettes    Start date: 11/30/1993    Quit date: 11/30/2013    Years since quitting: 10.4    Passive exposure: Past   Smokeless tobacco: Former    Types: Snuff    Quit date: 05/21/2018  Vaping Use   Vaping status: Never Used  Substance and Sexual Activity   Alcohol use: Yes    Comment: occ   Drug use: No   Sexual activity: Yes  Other Topics Concern   Not on file  Social History Narrative   Lives with wife and daughter at home   Right Handed   Drinks about 2 cups caffeine daily   Social Drivers of Health   Financial Resource Strain: High Risk (01/04/2024)   Overall Financial Resource Strain (CARDIA)    Difficulty of Paying Living Expenses: Hard  Food Insecurity: Food Insecurity Present (01/04/2024)   Hunger Vital Sign    Worried About Running Out of Food in the Last Year: Sometimes true    Ran Out of Food in the Last Year: Never true  Transportation Needs: No Transportation Needs (01/04/2024)   PRAPARE - Administrator, Civil Service (Medical): No    Lack of Transportation (Non-Medical): No  Physical Activity:  Sufficiently Active (01/04/2024)   Exercise Vital Sign    Days of Exercise per Week: 4 days    Minutes of Exercise per Session: 40 min  Stress: Stress Concern Present (01/04/2024)   [employer] of Occupational Health - Occupational Stress Questionnaire    Feeling of Stress : Very much  Social Connections: Moderately Isolated (01/04/2024)   Social Connection and Isolation Panel    Frequency of Communication with Friends and Family: More than three times a week    Frequency of Social Gatherings with Friends and Family: Twice a week    Attends Religious Services: Never    Database administrator or Organizations: No    Attends Engineer, structural: Not on file    Marital Status: Married  Catering manager Violence: Not At Risk (10/04/2022)   Received from [facility]   HITS    Over the last 12 months how often did your partner physically hurt you?: Never    Over the last 12 months how often did your partner insult you or talk down to you?: Never    Over the last 12 months how often did your partner threaten you with physical harm?: Never    Over the last 12 months how often did your partner scream or curse at you?: Never     Allergies  Allergen Reactions   Diltiazem  Hcl Diarrhea and Other (See Comments)    Lethargic      Outpatient Medications Prior to Visit  Medication Sig Dispense Refill   acetaminophen  (TYLENOL ) 500 MG tablet Take 1,000 mg by mouth every 6 (six) hours as needed for mild pain.     albuterol  (VENTOLIN  HFA) 108 (90 Base) MCG/ACT inhaler Inhale 2 puffs into the lungs every 6 (six) hours as needed for wheezing or shortness of breath. 8 g 6   buPROPion  (WELLBUTRIN ) 75  MG tablet TAKE 1 TABLET BY MOUTH TWICE A DAY 180 tablet 1   cyclobenzaprine (FLEXERIL) 10 MG tablet Take 0.5-1 tablets (5-10 mg total) by mouth 2 (two) times daily as needed for muscle spasms. 20 tablet 0   escitalopram  (LEXAPRO ) 20 MG tablet TAKE 1 TABLET BY MOUTH EVERY DAY 90 tablet 1    fluticasone  (FLONASE ) 50 MCG/ACT nasal spray Place 1 spray into both nostrils daily as needed for allergies.     hydrochlorothiazide  (HYDRODIURIL ) 25 MG tablet Take 1 tablet (25 mg total) by mouth daily. 90 tablet 2   KLOR-CON  M20 20 MEQ tablet TAKE 1 TABLET BY MOUTH EVERY DAY 90 tablet 1   methylPREDNISolone  (MEDROL  DOSEPAK) 4 MG TBPK tablet Use as directed 21 tablet 0   Multiple Vitamin (MULTIVITAMIN) tablet Take by mouth daily. 2 chews daily     mycophenolate  (CELLCEPT ) 500 MG tablet Take 3 tablets (1,500 mg total) by mouth 2 (two) times daily. 540 tablet 3   nitroGLYCERIN  (NITROSTAT ) 0.4 MG SL tablet Place 1 tablet (0.4 mg total) under the tongue every 5 (five) minutes as needed for chest pain. 90 tablet 3   omeprazole (PRILOSEC) 20 MG capsule Take 20 mg by mouth 2 (two) times daily before a meal.     oxyCODONE  (ROXICODONE ) 5 MG immediate release tablet Take 0.5-1 tablets (2.5-5 mg total) by mouth every 6 (six) hours as needed for severe pain (pain score 7-10). 10 tablet 0   rosuvastatin  (CRESTOR ) 20 MG tablet Take 1 tablet (20 mg total) by mouth daily. 90 tablet 1   telmisartan  (MICARDIS ) 80 MG tablet TAKE 1 TABLET BY MOUTH EVERY DAY 90 tablet 2   triamcinolone  cream (KENALOG ) 0.1 % Apply to affected area 1-2 times daily 30 g 0   warfarin (COUMADIN ) 5 MG tablet TAKE 2 TABLETS DAILY EXCEPT TAKE 3 TABLETS ON MONDAYS OR AS DIRECTED BY ANTICOAGULATION CLINIC 200 tablet 1   No facility-administered medications prior to visit.    Review of Systems  Constitutional:  Negative for chills, fever, malaise/fatigue and weight loss.  HENT:  Negative for congestion, sinus pain and sore throat.   Eyes: Negative.   Respiratory:  Negative for cough, hemoptysis, sputum production, shortness of breath and wheezing.   Cardiovascular:  Negative for chest pain, palpitations, orthopnea, claudication and leg swelling.  Gastrointestinal:  Negative for abdominal pain, heartburn, nausea and vomiting.   Genitourinary: Negative.   Musculoskeletal:  Negative for joint pain and myalgias.  Skin:  Negative for rash.  Neurological:  Negative for weakness.  Endo/Heme/Allergies: Negative.   Psychiatric/Behavioral: Negative.      Objective:   There were no vitals filed for this visit.   Physical Exam Constitutional:      General: He is not in acute distress. HENT:     Head: Normocephalic and atraumatic.   Eyes:     Conjunctiva/sclera: Conjunctivae normal.    Cardiovascular:     Rate and Rhythm: Normal rate and regular rhythm.     Pulses: Normal pulses.     Heart sounds: Normal heart sounds. No murmur heard. Pulmonary:     Breath sounds: No rales.   Musculoskeletal:     Right lower leg: No edema.     Left lower leg: No edema.   Skin:    General: Skin is warm and dry.   Neurological:     General: No focal deficit present.     Mental Status: He is alert.    CBC    Component Value  Date/Time   WBC 7.6 12/22/2023 1007   RBC 5.54 12/22/2023 1007   HGB 16.3 12/22/2023 1007   HGB 14.6 04/18/2021 0817   HCT 49.1 12/22/2023 1007   PLT 297.0 12/22/2023 1007   PLT 254 04/18/2021 0817   MCV 88.7 12/22/2023 1007   MCH 30.3 09/07/2023 1240   MCHC 33.1 12/22/2023 1007   RDW 15.2 12/22/2023 1007   LYMPHSABS 1.5 12/22/2023 1007   MONOABS 0.7 12/22/2023 1007   EOSABS 0.2 12/22/2023 1007   BASOSABS 0.1 12/22/2023 1007      Latest Ref Rng & Units 12/22/2023   10:07 AM 10/13/2023    9:21 AM 10/05/2023    1:41 PM  BMP  Glucose 70 - 99 mg/dL 469  629  528   BUN 6 - 23 mg/dL 17  16  12    Creatinine 0.40 - 1.50 mg/dL 4.13  2.44  0.10   Sodium 135 - 145 mEq/L 135  135  135   Potassium 3.5 - 5.1 mEq/L 4.0  4.3  3.2   Chloride 96 - 112 mEq/L 102  102  99   CO2 19 - 32 mEq/L 24  26  29    Calcium  8.4 - 10.5 mg/dL 9.4  9.6  9.3    Labs: 01/13/2021 ANA Positive dsDNA 177 IU/mL (0-9IU/mL) Ribnucleic Protien 2.4 (0- 0.9AI)  01/15/21 MyoMarker 3 Panel is negative  Chest  imaging: HRCT Chest 12/15/23 1. Patchy pulmonary parenchymal ground-glass, ill-defined peribronchovascular ground-glass nodularity and music attenuation with cylindrical bronchiectasis and mild air trapping, findings which can be seen in the setting of non fibrotic hypersensitivity pneumonitis. Findings are suggestive of an alternative diagnosis (not UIP) per consensus guidelines: Diagnosis of Idiopathic Pulmonary Fibrosis: An Official ATS/ERS/JRS/ALAT Clinical Practice Guideline. Am Annie Barton Crit Care Med Vol 198, Iss 5, [phone], Jul 31 2017. 2. Age advanced three-vessel coronary artery calcification. 3. Low-attenuation thyroid  nodules measure up to at least 2.7 cm. Previous thyroid  ultrasound 01/07/2021 and biopsy 02/05/2021. No follow-up recommended unless clinically warranted. (Ref: J Am Coll Radiol. 2015 Feb;12(2): 143-50). 4. Hepatic steatosis.    CT Abd 09/07/23 Lower chest: Patchy and geographic ground-glass opacities in both lung bases. This is more diffuse than on prior exam.  CTA Chest 01/13/21 1. No demonstrable pulmonary embolus. No thoracic aortic aneurysm or dissection. There are foci of aortic atherosclerosis as well as foci of coronary artery calcification.   2. Extensive airspace opacity bilaterally with overall increased compared to December 2021. Areas of patchy consolidation noted in several areas. The overall appearance is most indicative of atypical organism pneumonia. A degree of bacterial superinfection is question. Note that there has been persistence of multifocal airspace opacity for several months. This circumstance may warrant Pulmonary Medicine consultation with consideration for bronchoscopy for further assessment with respect to etiology for the parenchymal lung lesions.   3. Stable adenopathy of uncertain etiology. Given the parenchymal lung changes, this adenopathy could have reactive etiology.   4. Hepatic steatosis. Liver appears prominent  although incompletely visualized. Gallbladder absent.   5. Thyroid  nodules with assessment by thyroid  ultrasound 6 days prior. Please see recent thyroid  ultrasound report with respect to thyroid  nodular assessment.  PFT:    Latest Ref Rng & Units 01/20/2024   11:20 AM 03/25/2021    8:59 AM 03/08/2020    2:51 PM  PFT Results  FVC-Pre L 4.18  4.17  4.31   FVC-Predicted Pre % 73  70  72   FVC-Post L 4.33  4.02  4.29  FVC-Predicted Post % 75  68  72   Pre FEV1/FVC % % 88  88  86   Post FEV1/FCV % % 88  91  90   FEV1-Pre L 3.68  3.66  3.71   FEV1-Predicted Pre % 83  79  80   FEV1-Post L 3.81  3.67  3.84   DLCO uncorrected ml/min/mmHg 30.22  26.04  26.46   DLCO UNC% % 91  76  77   DLCO corrected ml/min/mmHg 28.93  25.42  26.46   DLCO COR %Predicted % 87  74  77   DLVA Predicted % 111  96  99   TLC L 5.76  6.11  5.98   TLC % Predicted % 74  77  76   RV % Predicted % 56  74  67   03/25/21 mild restrictive defect and mild diffusion defect  Echo 07/31/20:  1. Left ventricular ejection fraction, by estimation, is 60 to 65%. The  left ventricle has normal function. The left ventricle has no regional  wall motion abnormalities. There is mild left ventricular hypertrophy.  Left ventricular diastolic parameters  were normal.   2. Right ventricular systolic function is normal. The right ventricular  size is normal.   3. Left atrial size was mildly dilated.   4. The mitral valve is normal in structure. No evidence of mitral valve  regurgitation. No evidence of mitral stenosis.   5. The aortic valve is normal in structure. Aortic valve regurgitation is  not visualized. No aortic stenosis is present.   6. The inferior vena cava is normal in size with greater than 50%  respiratory variability, suggesting right atrial pressure of 3 mmHg.  NM Myocardial Stress Test 08/01/20 The left ventricular ejection fraction is normal (55-65%). Nuclear stress EF: 59%. There were no wall motion  abnormalities There was no ST segment deviation noted during stress. The study is normal. This is a low risk study. No perfusion defects.  Assessment & Plan:   No diagnosis found.  Discussion: Wesley Hancock is a 54 year old male, former smoker with history of DVT/PE due to antiphospholipid syndrome diagnosed in September 2020 who presented with shortness of breath and chest pain 01/13/21 with workup showing signs of interstitial lung disease related to lupus/mixed connective tissue overlap syndrome.   Interstitial Lung Disease (ILD) secondary to Mixed Connective Tissue Disease Recent CT Chest with mild ground glass opacities bilaterally. Stable pulmonary function tests. -Increase Cellcept  to 1500mg  twice daily given ground glass on CT chest -Repeat labs in 3-4 months -Followed by Rheumatology for mixed connective tissue disease  Obstructive Sleep Apnea (OSA) Compliant with CPAP use, with good control of events per hour. Noted some wheezing with colds. -Continue CPAP use as currently prescribed.  Antiphospholipid syndrome with hx of DVT/PE Stable on Coumadin , with recent INR at 2.7. Regularly monitored by primary care. -Continue current regimen.  Follow-up in 4 months  Duaine German, MD [facility] Office: [phone]   Current Outpatient Medications:    acetaminophen  (TYLENOL ) 500 MG tablet, Take 1,000 mg by mouth every 6 (six) hours as needed for mild pain., Disp: , Rfl:    albuterol  (VENTOLIN  HFA) 108 (90 Base) MCG/ACT inhaler, Inhale 2 puffs into the lungs every 6 (six) hours as needed for wheezing or shortness of breath., Disp: 8 g, Rfl: 6   buPROPion  (WELLBUTRIN ) 75 MG tablet, TAKE 1 TABLET BY MOUTH TWICE A DAY, Disp: 180 tablet, Rfl: 1   cyclobenzaprine (FLEXERIL) 10  MG tablet, Take 0.5-1 tablets (5-10 mg total) by mouth 2 (two) times daily as needed for muscle spasms., Disp: 20 tablet, Rfl: 0   escitalopram  (LEXAPRO ) 20 MG tablet, TAKE 1  TABLET BY MOUTH EVERY DAY, Disp: 90 tablet, Rfl: 1   fluticasone  (FLONASE ) 50 MCG/ACT nasal spray, Place 1 spray into both nostrils daily as needed for allergies., Disp: , Rfl:    hydrochlorothiazide  (HYDRODIURIL ) 25 MG tablet, Take 1 tablet (25 mg total) by mouth daily., Disp: 90 tablet, Rfl: 2   KLOR-CON  M20 20 MEQ tablet, TAKE 1 TABLET BY MOUTH EVERY DAY, Disp: 90 tablet, Rfl: 1   methylPREDNISolone  (MEDROL  DOSEPAK) 4 MG TBPK tablet, Use as directed, Disp: 21 tablet, Rfl: 0   Multiple Vitamin (MULTIVITAMIN) tablet, Take by mouth daily. 2 chews daily, Disp: , Rfl:    mycophenolate  (CELLCEPT ) 500 MG tablet, Take 3 tablets (1,500 mg total) by mouth 2 (two) times daily., Disp: 540 tablet, Rfl: 3   nitroGLYCERIN  (NITROSTAT ) 0.4 MG SL tablet, Place 1 tablet (0.4 mg total) under the tongue every 5 (five) minutes as needed for chest pain., Disp: 90 tablet, Rfl: 3   omeprazole (PRILOSEC) 20 MG capsule, Take 20 mg by mouth 2 (two) times daily before a meal., Disp: , Rfl:    oxyCODONE  (ROXICODONE ) 5 MG immediate release tablet, Take 0.5-1 tablets (2.5-5 mg total) by mouth every 6 (six) hours as needed for severe pain (pain score 7-10)., Disp: 10 tablet, Rfl: 0   rosuvastatin  (CRESTOR ) 20 MG tablet, Take 1 tablet (20 mg total) by mouth daily., Disp: 90 tablet, Rfl: 1   telmisartan  (MICARDIS ) 80 MG tablet, TAKE 1 TABLET BY MOUTH EVERY DAY, Disp: 90 tablet, Rfl: 2   triamcinolone  cream (KENALOG ) 0.1 %, Apply to affected area 1-2 times daily, Disp: 30 g, Rfl: 0   warfarin (COUMADIN ) 5 MG tablet, TAKE 2 TABLETS DAILY EXCEPT TAKE 3 TABLETS ON MONDAYS OR AS DIRECTED BY ANTICOAGULATION CLINIC, Disp: 200 tablet, Rfl: 1

## 2024-05-16 NOTE — ED Provider Notes (Signed)
 [place] EMERGENCY DEPARTMENT AT [facility] Provider Note   CSN: [id number] Arrival date & time: 05/14/24  1812     Patient presents with: Back Pain   Wesley Hancock is a 53 y.o. male who  has a past medical history of Acute nonintractable headache (09/06/2019), Acute respiratory failure with hypoxia ([name]) (08/30/2019), AKI (acute kidney injury) ([name]) (08/30/2019), Antiphospholipid syndrome ([name]) (10/30/2019), Anxiety, APS (antiphospholipid syndrome) ([name]), Arthritis, Colon polyp, Depression, Diarrhea (05/21/2020), DVT (deep venous thrombosis) ([name]), Dyspnea on exertion (09/08/2017), Esophageal spasm, Essential hypertension (09/23/2014), GAD (generalized anxiety disorder) (08/06/2016), Interstitial lung disease ([name]), Lobar pneumonia ([name]) (08/30/2019), Lung nodule (09/21/2014), Lupus, Mixed hyperlipidemia (09/18/2015), Morbid obesity due to excess calories ([name]) c/b hbp/ dvt/PE (09/10/2019), Multiple pulmonary emboli ([name]) (08/22/2019), Nutcracker esophagus, Paresthesia of right foot (11/08/2019), Pulmonary infiltrates (09/10/2019), Recurrent pneumonia (07/09/2020), Snoring (07/09/2020), Stress fracture, Stroke ([name]), Upper airway cough syndrome (01/05/2020), and Witnessed apneic spells (07/09/2020). The patient reports that he sat down on the toilet to use the bathroom earlier today. The he leaned over to pull up his pants and had a sudden sharp pain in his back and was unable to to get back up. He complain of severe pain in his lumbosacral pain and spasm. The patient states that as long as he is laying flat on his back he feels ok. He has severe pain with any change in position and feel like his legs will buckle when he stands due to the pain in his lower back. Denies weakness, loss of bowel/bladder function or saddle anesthesia..  Denies fever or recent procedures to back.     Back Pain      Prior to Admission medications   Medication Sig Start Date End Date Taking?  Authorizing Provider  cyclobenzaprine  (FLEXERIL ) 10 MG tablet Take 0.5-1 tablets (5-10 mg total) by mouth 2 (two) times daily as needed for muscle spasms. 05/14/24  Yes Sylvestre Rathgeber, PA-C  methylPREDNISolone  (MEDROL  DOSEPAK) 4 MG TBPK tablet Use as directed 05/14/24  Yes Ezella Kell, PA-C  oxyCODONE  (ROXICODONE ) 5 MG immediate release tablet Take 0.5-1 tablets (2.5-5 mg total) by mouth every 6 (six) hours as needed for severe pain (pain score 7-10). 05/14/24  Yes Jermarcus Mcfadyen, PA-C  acetaminophen  (TYLENOL ) 500 MG tablet Take 1,000 mg by mouth every 6 (six) hours as needed for mild pain.    [provider]  albuterol  (VENTOLIN  HFA) 108 (90 Base) MCG/ACT inhaler Inhale 2 puffs into the lungs every 6 (six) hours as needed for wheezing or shortness of breath. 04/22/21   Kara Dorn NOVAK, MD  buPROPion  (WELLBUTRIN ) 75 MG tablet TAKE 1 TABLET BY MOUTH TWICE A DAY 08/18/23   Kennyth Worth HERO, MD  escitalopram  (LEXAPRO ) 20 MG tablet TAKE 1 TABLET BY MOUTH EVERY DAY 07/09/23   Job Lukes, PA  fluticasone  (FLONASE ) 50 MCG/ACT nasal spray Place 1 spray into both nostrils daily as needed for allergies.    [provider]  hydrochlorothiazide  (HYDRODIURIL ) 25 MG tablet Take 1 tablet (25 mg total) by mouth daily. 07/09/23   Wyn Jackee VEAR Mickey., NP  KLOR-CON  M20 20 MEQ tablet TAKE 1 TABLET BY MOUTH EVERY DAY 11/01/23   Kennyth Worth HERO, MD  Multiple Vitamin (MULTIVITAMIN) tablet Take by mouth daily. 2 chews daily    [provider]  mycophenolate  (CELLCEPT ) 500 MG tablet Take 3 tablets (1,500 mg total) by mouth 2 (two) times daily. 01/20/24   Kara Dorn NOVAK, MD  nitroGLYCERIN  (NITROSTAT ) 0.4 MG SL tablet Place 1 tablet (0.4 mg  total) under the tongue every 5 (five) minutes as needed for chest pain. 07/09/23 10/07/23  Wyn Jackee VEAR Mickey., NP  omeprazole (PRILOSEC) 20 MG capsule Take 20 mg by mouth 2 (two) times daily before a meal.    [provider]  rosuvastatin  (CRESTOR ) 20  MG tablet Take 1 tablet (20 mg total) by mouth daily. 07/09/23 12/13/23  Wyn Jackee VEAR Mickey., NP  telmisartan  (MICARDIS ) 80 MG tablet TAKE 1 TABLET BY MOUTH EVERY DAY 10/11/23   Monetta Redell PARAS, MD  triamcinolone  cream (KENALOG ) 0.1 % Apply to affected area 1-2 times daily 06/11/22   Worley, Samantha, PA  warfarin (COUMADIN ) 5 MG tablet TAKE 2 TABLETS DAILY EXCEPT TAKE 3 TABLETS ON MONDAYS OR AS DIRECTED BY ANTICOAGULATION CLINIC 12/08/23   Kennyth Worth HERO, MD    Allergies: Diltiazem  hcl    Review of Systems  Musculoskeletal:  Positive for back pain.    Updated Vital Signs BP 119/82 (BP Location: Right Arm)   Pulse 73   Temp 98.6 F (37 C)   Resp 17   Ht 6' 2 (1.88 m)   Wt (!) 141.5 kg   SpO2 95%   BMI 40.06 kg/m   Physical Exam Vitals and nursing note reviewed.  Constitutional:      General: He is not in acute distress.    Appearance: He is well-developed. He is not diaphoretic.  HENT:     Head: Normocephalic and atraumatic.   Eyes:     General: No scleral icterus.    Conjunctiva/sclera: Conjunctivae normal.    Cardiovascular:     Rate and Rhythm: Normal rate and regular rhythm.     Heart sounds: Normal heart sounds.  Pulmonary:     Effort: Pulmonary effort is normal. No respiratory distress.     Breath sounds: Normal breath sounds.  Abdominal:     Palpations: Abdomen is soft.     Tenderness: There is no abdominal tenderness.     Comments: Cental obesity with obese abdomen   Musculoskeletal:     Cervical back: Normal range of motion and neck supple.     Comments: Exam limited due to patient's pain No midline tenderness. Tenderness and spasm noted to the BL lumbar paraspinals and over the sacrum. ROM limited severely due to pain and spasm Normal patella reflexes ,strength with dorsi and plantar flexion at the ankle, and sensation   Skin:    General: Skin is warm and dry.   Neurological:     Mental Status: He is alert.   Psychiatric:        Behavior: Behavior  normal.     (all labs ordered are listed, but only abnormal results are displayed) Labs Reviewed - No data to display  EKG: None  Radiology: No results found.   Procedures   Medications Ordered in the ED  ketorolac  (TORADOL ) 30 MG/ML injection 30 mg (30 mg Intravenous Given 05/14/24 1848)  dexamethasone  (DECADRON ) injection 10 mg (10 mg Intravenous Given 05/14/24 1851)  HYDROmorphone  (DILAUDID ) injection 0.5 mg (0.5 mg Intravenous Given 05/14/24 1851)  diazepam  (VALIUM ) tablet 2 mg (2 mg Oral Given 05/14/24 1854)  HYDROmorphone  (DILAUDID ) injection 0.5 mg (0.5 mg Intravenous Given 05/14/24 1957)    Clinical Course as of 05/16/24 2007  Sun May 14, 2024  1943 Patient reevaluated. He is improced somewhat, however was unable to stand wihtout his legs buckling. Will try more meds  [AH]    Clinical Course User Index [AH] Maraki Macquarrie, PA-C  Medical Decision Making Patient here with acute back pain. The emergent differential diagnosis for low back pain includes acute ligamentous and injury, cord compression syndrome, pathologic fracture, transverse myelitis, vertebral osteomyelitis, discitis, and epidural abscess  I ordered and reviewed a Ct Lumbar spine film which shows chronic degenerative changes without acute finding.  Patient treated with pain medications here in the ed with some improvement.  Able to walk with some assistance.  Patient with back pain.   No loss of bowel or bladder control.  No concern for cauda equina.  No fever, night sweats, weight loss, h/o cancer, IVDU.  RICE protocol and pain medicine indicated and discussed with patient.   PDMP reviewed during this encounter.    Amount and/or Complexity of Data Reviewed Radiology: ordered and independent interpretation performed.  Risk Prescription drug management.        Final diagnoses:  Acute bilateral low back pain without sciatica    ED Discharge Orders           Ordered    oxyCODONE  (ROXICODONE ) 5 MG immediate release tablet  Every 6 hours PRN        05/14/24 2057    cyclobenzaprine  (FLEXERIL ) 10 MG tablet  2 times daily PRN        05/14/24 2057    methylPREDNISolone  (MEDROL  DOSEPAK) 4 MG TBPK tablet        05/14/24 2057               Arloa Chroman, PA-C 05/16/24 2055    Doretha Folks, MD 05/21/24 2038

## 2024-05-17 ENCOUNTER — Ambulatory Visit (INDEPENDENT_AMBULATORY_CARE_PROVIDER_SITE_OTHER)

## 2024-05-17 ENCOUNTER — Ambulatory Visit

## 2024-05-17 DIAGNOSIS — Z7901 Long term (current) use of anticoagulants: Secondary | ICD-10-CM

## 2024-05-17 LAB — POCT INR: INR: 3.8 — AB (ref 2.0–3.0)

## 2024-05-17 NOTE — Patient Instructions (Addendum)
 Pre visit review using our clinic review tool, if applicable. No additional management support is needed unless otherwise documented below in the visit note.  Reduce dose today to take 1 tablet and then continue 2 tablets daily except take 3 tablets on Mondays and Thursdays. Recheck in 1 weeks.

## 2024-05-17 NOTE — Progress Notes (Signed)
 Pt was in ER on 6/15 with severe lower back pain and  prescribed methylprednisolone . This can have a major interaction with warfarin. It can make INR go up or down. Reduce dose today to take 1 tablet and then continue 2 tablets daily except take 3 tablets on Mondays and Thursdays. Recheck in 1 weeks.

## 2024-05-24 ENCOUNTER — Ambulatory Visit (INDEPENDENT_AMBULATORY_CARE_PROVIDER_SITE_OTHER)

## 2024-05-24 DIAGNOSIS — Z7901 Long term (current) use of anticoagulants: Secondary | ICD-10-CM | POA: Diagnosis not present

## 2024-05-24 LAB — POCT INR: INR: 2.9 (ref 2.0–3.0)

## 2024-05-24 NOTE — Patient Instructions (Addendum)
 Pre visit review using our clinic review tool, if applicable. No additional management support is needed unless otherwise documented below in the visit note.  Continue 2 tablets daily except take 3 tablets on Mondays and Thursdays. Recheck in 4 weeks.

## 2024-05-24 NOTE — Progress Notes (Signed)
 Continue 2 tablets daily except take 3 tablets on Mondays and Thursdays. Recheck in 4 weeks.

## 2024-05-24 NOTE — Progress Notes (Signed)
 I have reviewed the patient's encounter and agree with the documentation.  Worth HERO. Kennyth, MD 05/24/2024 9:38 AM

## 2024-06-21 ENCOUNTER — Ambulatory Visit

## 2024-06-21 DIAGNOSIS — Z7901 Long term (current) use of anticoagulants: Secondary | ICD-10-CM | POA: Diagnosis not present

## 2024-06-21 LAB — POCT INR: INR: 3.5 — AB (ref 2.0–3.0)

## 2024-06-21 MED ORDER — WARFARIN SODIUM 5 MG PO TABS: ORAL_TABLET | ORAL | 1 refills | Status: DC

## 2024-06-21 NOTE — Progress Notes (Signed)
 Continue 2 tablets daily except take 3 tablets on Mondays and Thursdays. Recheck in 4 weeks.  Pt is compliant with warfarin management and PCP apts.  Sent in refill of warfarin to requested pharmacy.

## 2024-06-21 NOTE — Progress Notes (Signed)
 I have reviewed the patient's encounter and agree with the documentation.  Worth HERO. Kennyth, MD 06/21/2024 9:33 AM

## 2024-06-21 NOTE — Patient Instructions (Addendum)
 Pre visit review using our clinic review tool, if applicable. No additional management support is needed unless otherwise documented below in the visit note.  Continue 2 tablets daily except take 3 tablets on Mondays and Thursdays. Recheck in 4 weeks.

## 2024-07-06 ENCOUNTER — Encounter (HOSPITAL_COMMUNITY): Payer: Self-pay

## 2024-07-06 ENCOUNTER — Ambulatory Visit: Payer: Self-pay

## 2024-07-06 ENCOUNTER — Ambulatory Visit (HOSPITAL_COMMUNITY)
Admission: RE | Admit: 2024-07-06 | Discharge: 2024-07-06 | Disposition: A | Discharge status: Home / Self Care | Source: Ambulatory Visit

## 2024-07-06 ENCOUNTER — Ambulatory Visit (INDEPENDENT_AMBULATORY_CARE_PROVIDER_SITE_OTHER)

## 2024-07-06 VITALS — BP 134/79 | HR 88 | Temp 98.0°F | Resp 18 | Ht 74.0 in | Wt 310.0 lb

## 2024-07-06 DIAGNOSIS — S63642A Sprain of metacarpophalangeal joint of left thumb, initial encounter: Secondary | ICD-10-CM | POA: Diagnosis not present

## 2024-07-06 DIAGNOSIS — M79642 Pain in left hand: Secondary | ICD-10-CM | POA: Diagnosis not present

## 2024-07-06 MED ORDER — DICLOFENAC SODIUM 50 MG PO TBEC: 50.0000 mg | DELAYED_RELEASE_TABLET | Freq: Two times a day (BID) | ORAL | 1 refills | Status: AC

## 2024-07-06 NOTE — Discharge Instructions (Signed)
  1. Sprain of metacarpophalangeal (MCP) joint of left thumb, initial encounter (Primary) - DG Hand Complete Left x-ray performed in UC shows no acute fracture or acute bony injury. - diclofenac  (VOLTAREN ) 50 MG EC tablet; Take 1 tablet (50 mg total) by mouth 2 (two) times daily.  Dispense: 30 tablet; Refill: 1 - AMB referral to sports medicine for follow-up evaluation and management of ongoing hand/thumb pain

## 2024-07-06 NOTE — ED Provider Notes (Signed)
 [facility]  Note:  This document was prepared using Dragon voice recognition software and may include unintentional dictation errors.  MRN: [id number] DOB: 12-22-1970  Subjective:   Wesley Hancock is a 53 y.o. male presenting for left hand pain over palmar aspect of the thumb x 1 week.  Patient denies any known injury or trauma to the area.  Patient has been using a thumb brace to help minimize overuse and protect from further injury.  Patient reports he is a [employer] and has history of lupus and thought that may be his lupus was causing his pain but symptoms have not improved.  Patient is been taking Tylenol  with minimal improvement.  Patient states that pain increases throughout the day with use.  No current facility-administered medications for this encounter.  Current Outpatient Medications:    acetaminophen  (TYLENOL ) 500 MG tablet, Take 1,000 mg by mouth every 6 (six) hours as needed for mild pain., Disp: , Rfl:    albuterol  (VENTOLIN  HFA) 108 (90 Base) MCG/ACT inhaler, Inhale 2 puffs into the lungs every 6 (six) hours as needed for wheezing or shortness of breath., Disp: 8 g, Rfl: 6   buPROPion  (WELLBUTRIN ) 75 MG tablet, TAKE 1 TABLET BY MOUTH TWICE A DAY, Disp: 180 tablet, Rfl: 1   diclofenac  (VOLTAREN ) 50 MG EC tablet, Take 1 tablet (50 mg total) by mouth 2 (two) times daily., Disp: 30 tablet, Rfl: 1   escitalopram  (LEXAPRO ) 20 MG tablet, TAKE 1 TABLET BY MOUTH EVERY DAY, Disp: 90 tablet, Rfl: 1   fluticasone  (FLONASE ) 50 MCG/ACT nasal spray, Place 1 spray into both nostrils daily as needed for allergies., Disp: , Rfl:    hydrochlorothiazide  (HYDRODIURIL ) 25 MG tablet, Take 1 tablet (25 mg total) by mouth daily., Disp: 90 tablet, Rfl: 2   KLOR-CON  M20 20 MEQ tablet, TAKE 1 TABLET BY MOUTH EVERY DAY, Disp: 90 tablet, Rfl: 1   Multiple Vitamin (MULTIVITAMIN) tablet, Take by mouth daily. 2 chews daily, Disp: , Rfl:    mycophenolate  (CELLCEPT ) 500 MG tablet, Take 3  tablets (1,500 mg total) by mouth 2 (two) times daily., Disp: 540 tablet, Rfl: 3   omeprazole (PRILOSEC) 20 MG capsule, Take 20 mg by mouth 2 (two) times daily before a meal., Disp: , Rfl:    rosuvastatin  (CRESTOR ) 20 MG tablet, Take 1 tablet (20 mg total) by mouth daily., Disp: 90 tablet, Rfl: 1   telmisartan  (MICARDIS ) 80 MG tablet, TAKE 1 TABLET BY MOUTH EVERY DAY, Disp: 90 tablet, Rfl: 2   triamcinolone  cream (KENALOG ) 0.1 %, Apply to affected area 1-2 times daily, Disp: 30 g, Rfl: 0   warfarin (COUMADIN ) 5 MG tablet, TAKE 2 TABLETS DAILY EXCEPT TAKE 3 TABLETS ON MONDAYS AND THURSDAYS OR AS DIRECTED BY ANTICOAGULATION CLINIC, Disp: 200 tablet, Rfl: 1   nitroGLYCERIN  (NITROSTAT ) 0.4 MG SL tablet, Place 1 tablet (0.4 mg total) under the tongue every 5 (five) minutes as needed for chest pain., Disp: 90 tablet, Rfl: 3   Allergies  Allergen Reactions   Diltiazem  Hcl Diarrhea and Other (See Comments)    Lethargic     Past Medical History:  Diagnosis Date   Acute nonintractable headache 09/06/2019   Acute respiratory failure with hypoxia ([name]) 08/30/2019   AKI (acute kidney injury) ([name]) 08/30/2019   Antiphospholipid syndrome ([name]) 10/30/2019   Anxiety    APS (antiphospholipid syndrome) ([name])    Arthritis    Colon polyp    ? hyperplastic   Depression    Diarrhea  05/21/2020   DVT (deep venous thrombosis) ([name])    Dyspnea on exertion 09/08/2017   Quit smoking 2015 with onset of symptoms in 2016  Spirometry 09/08/2017  Flat f/v loop  - d/c acei 09/08/2017  - 01/04/2020   Walked RA x two laps =  approx 543ft @ fast pace - stopped due to end of study/ min sob with sats of 93 % at the end of the study. - PFT's  03/08/20   FEV1 3.84 (83 % ) ratio 0..90  p 3 % improvement from saba p ? 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 06/26/20, 03/08/20   Snoring 07/09/2020   Stress fracture    in back, seeing PT   Stroke ([name])    Upper airway cough syndrome 01/05/2020   Onset mid Jan 2021 while on otc PPI  - max rx for gerd 01/04/2020 >>>      Witnessed apneic spells 07/09/2020     Past Surgical History:  Procedure Laterality Date   BRONCHIAL WASHINGS  01/14/2021   Procedure: BRONCHIAL WASHINGS;  Surgeon: Kara Dorn NOVAK, MD;  Location: [facility];  Service: Pulmonary;;   CHOLECYSTECTOMY     VIDEO BRONCHOSCOPY N/A 01/14/2021   Procedure: VIDEO BRONCHOSCOPY WITHOUT FLUORO;  Surgeon: Kara Dorn NOVAK, MD;  Location: [facility];  Service: Pulmonary;  Laterality: N/A;    Family History  Problem Relation Age of Onset   Diabetes Mother    Hyperlipidemia Mother    Hypertension Mother    Thyroid  disease Mother        uncertain type--had surg--no cancer   Asthma Mother        died from PNA   Heart disease Father 11   Hyperlipidemia Father    Hypertension Father    Prostate cancer Father 11   Lung cancer Father 69       Dx 06/18/2017   Colon polyps Father    Irritable bowel syndrome Father    Diverticulitis Father    Diabetes Sister    Hyperlipidemia Sister    Hypertension Sister    Diabetes Maternal  Grandmother    Heart disease Maternal Grandmother    Hyperlipidemia Maternal Grandmother    Hypertension Maternal Grandmother    Colon cancer Maternal Grandmother    Heart disease Maternal Grandfather    Hyperlipidemia Maternal Grandfather    Hypertension Maternal Grandfather    Stroke Maternal Grandfather    Liver cancer Maternal Grandfather    Irritable bowel syndrome Maternal Grandfather    Heart disease Paternal Grandmother    Healthy Daughter  Healthy Daughter    Healthy Son    Esophageal cancer Neg Hx    Stomach cancer Neg Hx    Rectal cancer Neg Hx     Social History   Tobacco Use   Smoking status: Former    Current packs/day: 0.00    Average packs/day: 1 pack/day for 20.0 years (20.0 ttl pk-yrs)    Types: Cigarettes    Start date: 11/30/1993    Quit date: 11/30/2013    Years since quitting: 10.6    Passive exposure: Past   Smokeless tobacco: Former    Types: Snuff    Quit date: 05/21/2018  Vaping Use   Vaping status: Never Used  Substance Use Topics   Alcohol use: Yes    Comment: occ   Drug use: No    ROS Refer to HPI for ROS details.  Objective:   Vitals: BP 134/79 (BP Location: Right Arm)   Pulse 88   Temp 98 F (36.7 C) (Oral)   Resp 18   Ht 6' 2 (1.88 m)   Wt (!) 310 lb (140.6 kg)   SpO2 95%   BMI 39.80 kg/m   Physical Exam Vitals and nursing note reviewed.  Constitutional:      General: He is not in acute distress.    Appearance: Normal appearance. He is well-developed. He is not ill-appearing or toxic-appearing.  HENT:     Head: Normocephalic.  Cardiovascular:     Rate and Rhythm: Normal rate.  Pulmonary:     Effort: Pulmonary effort is normal. No respiratory distress.  Musculoskeletal:     Left hand: Tenderness and bony tenderness present. No swelling. Decreased range of motion. Decreased strength. Normal capillary refill. Normal pulse.  Skin:    General: Skin is warm and dry.  Neurological:     General: No focal deficit present.      Mental Status: He is alert and oriented to person, place, and time.  Psychiatric:        Mood and Affect: Mood normal.        Behavior: Behavior normal.     Procedures  No results found for this or any previous visit (from the past 24 hours).  DG Hand Complete Left Result Date: 07/06/2024 CLINICAL DATA:  Left hand pain EXAM: LEFT HAND - COMPLETE 3+ VIEW COMPARISON:  None Available. FINDINGS: No acute fracture or dislocation. There is no evidence of arthropathy or other focal bone abnormality. Soft tissues are unremarkable. No radiopaque foreign body. IMPRESSION: No acute fracture or dislocation. Electronically Signed   By: Rogelia Myers M.D.   On: 07/06/2024 14:14     Assessment and Plan :     Discharge Instructions       1. Sprain of metacarpophalangeal (MCP) joint of left thumb, initial encounter (Primary) - DG Hand Complete Left x-ray performed in UC shows no acute fracture or acute bony injury. - diclofenac  (VOLTAREN ) 50 MG EC tablet; Take 1 tablet (50 mg total) by mouth 2 (two) times daily.  Dispense: 30 tablet; Refill: 1 - AMB referral to sports medicine for follow-up evaluation and management of ongoing hand/thumb pain      Ethel KATHEE Aurea Aurea, [place] B, NP 07/06/24 1448

## 2024-07-06 NOTE — Telephone Encounter (Signed)
 FYI Only or Action Required?: FYI only for provider.  Patient was last seen in primary care on 01/05/2024 by Kennyth Worth HERO, MD.  Called Nurse Triage reporting Hand Pain.  Symptoms began a week ago.  Interventions attempted: OTC medications: tylenol .  Symptoms are: gradually worsening.  Triage Disposition: See HCP Within 4 Hours (Or PCP Triage)  Patient/caregiver understands and will follow disposition?: Yes    Copied from CRM [id number]. Topic: Clinical - Red Word Triage >> Jul 06, 2024  9:06 AM Vena H wrote: [facility] that prompted transfer to Nurse Triage: Pt states he hurt his thumb last week and has been wearing a brace and it is getting worse as he can't put any pressure on it or pick anything up. Pain level 8/10 when he tried to do something with it Reason for Disposition  [1] SEVERE pain (e.g., excruciating, unable to use hand at all) AND [2] not improved after 2 hours of pain medicine    Referred pt to urgent care: no opening with any providers at office until 07/18/2024.  Scheduled urgent care visit for patient for today at 12:30 pm  Answer Assessment - Initial Assessment Questions 1. ONSET: When did the pain start?     X week 2. LOCATION: Where is the pain located?     Left hand - thumb 3. PAIN: How bad is the pain? (Scale 1-10; or mild, moderate, severe)     8/10 4. WORK OR EXERCISE: Has there been any recent work or exercise that involved this part (i.e., hand or wrist) of the body? no 5. CAUSE: What do you think is causing the pain?     unknown 6. AGGRAVATING FACTORS: What makes the pain worse? (e.g., using computer)     Not able to apply pressure or grip 7. OTHER SYMPTOMS: Do you have any other symptoms? (e.g., fever, neck pain, numbness or tingling, rash, swelling)     no 8. PREGNANCY: Is there any chance you are pregnant? When was your last menstrual period?     na  Started wearing brace on Friday  Protocols used: Hand Pain-A-AH

## 2024-07-06 NOTE — ED Triage Notes (Signed)
 Patient presenting with left hand pain onset last Wednesday. States onset was random and this is new. No known injuries or history. Woke up with soreness and the pain got worse throughout the day. Patient is a [employer] and has a history of Lupus.  Unable to hold things for a long time or try to grab things without pain in the thumb.   Prescriptions or OTC medications tried: Yes- compression, tylenol     with little relief

## 2024-07-06 NOTE — Telephone Encounter (Signed)
 Noted

## 2024-07-19 ENCOUNTER — Ambulatory Visit

## 2024-07-19 DIAGNOSIS — Z7901 Long term (current) use of anticoagulants: Secondary | ICD-10-CM | POA: Diagnosis not present

## 2024-07-19 LAB — POCT INR: INR: 3 (ref 2.0–3.0)

## 2024-07-19 NOTE — Patient Instructions (Addendum)
 Pre visit review using our clinic review tool, if applicable. No additional management support is needed unless otherwise documented below in the visit note.  Continue 2 tablets daily except take 3 tablets on Mondays and Thursdays. Recheck in 4 weeks.

## 2024-07-19 NOTE — Progress Notes (Signed)
 I have reviewed the patient's encounter and agree with the documentation.  Worth HERO. Kennyth, MD 07/19/2024 8:53 AM

## 2024-07-19 NOTE — Progress Notes (Signed)
 Pt in UC on 8/7 for sprained left thumb. Prescribed diclofenac . This will interact with warfarin. Pt reports he researched the medication and learned it does interact so he did not take it. Continue 2 tablets daily except take 3 tablets on Mondays and Thursdays. Recheck in 4 weeks.

## 2024-08-16 ENCOUNTER — Ambulatory Visit (INDEPENDENT_AMBULATORY_CARE_PROVIDER_SITE_OTHER)

## 2024-08-16 DIAGNOSIS — Z7901 Long term (current) use of anticoagulants: Secondary | ICD-10-CM | POA: Diagnosis not present

## 2024-08-16 LAB — POCT INR: INR: 3.3 — AB (ref 2.0–3.0)

## 2024-08-16 NOTE — Progress Notes (Signed)
 Continue 2 tablets daily except take 3 tablets on Mondays and Thursdays. Recheck in 4 weeks.

## 2024-08-16 NOTE — Progress Notes (Signed)
 I have reviewed the patient's encounter and agree with the documentation.  Worth HERO. Kennyth, MD 08/16/2024 9:01 AM

## 2024-08-16 NOTE — Patient Instructions (Addendum)
 Pre visit review using our clinic review tool, if applicable. No additional management support is needed unless otherwise documented below in the visit note.  Continue 2 tablets daily except take 3 tablets on Mondays and Thursdays. Recheck in 4 weeks.

## 2024-08-29 ENCOUNTER — Ambulatory Visit: Admitting: Family Medicine

## 2024-08-29 ENCOUNTER — Ambulatory Visit: Payer: Self-pay | Admitting: *Deleted

## 2024-08-29 VITALS — BP 108/72 | HR 95 | Temp 97.4°F | Wt 312.0 lb

## 2024-08-29 DIAGNOSIS — M329 Systemic lupus erythematosus, unspecified: Secondary | ICD-10-CM | POA: Diagnosis not present

## 2024-08-29 DIAGNOSIS — J029 Acute pharyngitis, unspecified: Secondary | ICD-10-CM

## 2024-08-29 DIAGNOSIS — J849 Interstitial pulmonary disease, unspecified: Secondary | ICD-10-CM

## 2024-08-29 DIAGNOSIS — K529 Noninfective gastroenteritis and colitis, unspecified: Secondary | ICD-10-CM | POA: Diagnosis not present

## 2024-08-29 LAB — POCT RAPID STREP A (OFFICE): Rapid Strep A Screen: NEGATIVE

## 2024-08-29 LAB — POC COVID19 BINAXNOW: SARS Coronavirus 2 Ag: POSITIVE — AB

## 2024-08-29 MED ORDER — PREDNISONE 50 MG PO TABS: ORAL_TABLET | ORAL | 0 refills | Status: DC

## 2024-08-29 NOTE — Progress Notes (Signed)
 Wesley Hancock is a 53 y.o. male who presents today for an office visit.  Assessment/Plan:  New/Acute Problems: COVID Rapid COVID-positive.  Overall reassuring exam though does have comorbidities including lupus and interstitial lung disease that does increase his risk for complications.  We discussed trial of Paxlovid however he did not do well with this previously and like to hold off on this for now.  Due to his mild flare from ILD would be reasonable for us  to start a prednisone  burst at this point.  Will start 50 mg daily for 5 days.  He is aware of potential side effects.  He also has his albuterol  inhaler to use as needed.  Encouraged hydration.  He will let us  know if not improving over the next several days.  Chronic Problems Addressed Today: ILD (interstitial lung disease) ([name]) Follows with pulmonology for this and is currently on CellCept  1000 micrograms twice daily and albuterol  as needed.  Mild flare today related to above COVID infection.  Will start prednisone  burst.  Has tolerated this well previously.  SLE (systemic lupus erythematosus) ([name]) He will be seen in rheumatology soon.  On CellCept  as above.  Will be starting prednisone  burst as above as well today which should hopefully help some with his current inflammation.  Chronic diarrhea Patient is seeing gastroenterology for this.  Follow-up that his chronic diarrhea is likely secondary to cholecystectomy.  He cannot take cholestyramine due to being anticoagulated on warfarin.  He does have Imodium  to use as needed.     Subjective:  HPI:  See assessment / plan for status of chronic conditions.   Discussed the use of AI scribe software for clinical note transcription with the patient, who gave verbal consent to proceed.  History of Present Illness Wesley Hancock is a 52 year old male with interstitial lung disease who presents with COVID-19 symptoms.  He began feeling unwell on Friday evening after  taking his daughter to a doctor's appointment where she was found to have a fever of 101.79F. He developed symptoms including cough, fever, and back pain. A COVID-19 test confirmed his infection. His daughter, who is 20 years old, had a brief fever but recovered quickly. He notes that he does not recover as quickly due to his compromised immune system.  He has a history of interstitial lung disease and has experienced flares in the past. He has been treated with prednisone , which he tolerates well and finds effective in managing flares. He also mentions using cyclobenzaprine  and Tylenol  for pain management.  He has a history of back pain, which started before the current illness. A few months ago, he was hospitalized after experiencing severe back pain that left him unable to get up from the toilet. He was told at the hospital that imaging showed degenerative discs, but he did not receive a formal diagnosis. He reports that sitting is painful and that the pain is located in his lower back. He has not seen his rheumatologist recently but has an upcoming appointment.  He reports ongoing issues with diarrhea, which he attributes to not having a gallbladder and medication interactions. He manages this with Imodium , taking one pill as needed to avoid constipation.         Objective:  Physical Exam: BP 108/72   Pulse 95   Temp (!) 97.4 F (36.3 C) (Temporal)   Wt (!) 312 lb (141.5 kg)   SpO2 95%   BMI 40.06 kg/m   Gen: No acute distress, resting  comfortably CV: Regular rate and rhythm with no murmurs appreciated Pulm: Normal work of breathing, clear to auscultation bilaterally with no crackles, wheezes, or rhonchi Neuro: Grossly normal, moves all extremities Psych: Normal affect and thought content      Joni Norrod M. Kennyth, MD 08/29/2024 12:34 PM

## 2024-08-29 NOTE — Assessment & Plan Note (Signed)
 He will be seen in rheumatology soon.  On CellCept  as above.  Will be starting prednisone  burst as above as well today which should hopefully help some with his current inflammation.

## 2024-08-29 NOTE — Telephone Encounter (Signed)
 Copied from CRM [id number]. Topic: Clinical - Red Word Triage >> Aug 29, 2024  8:05 AM Wesley Hancock wrote: [facility] that prompted transfer to Nurse Triage: Patient having the following symptoms Cold Symptoms that wont go away.  Lower back pain, (thrown out) - Patient threw back out on Friday 08/25/2024   Transf. To NT Reason for Disposition  [1] MODERATE back pain (e.g., interferes with normal activities) AND [2] present > 3 days  Answer Assessment - Initial Assessment Questions 1. ONSET: When did the pain begin? (e.g., minutes, hours, days)     I'm having lower back pain.   It's not new.    A few months ago I was at the hospital due to it.   I went by ambulance because I could not get up.   I have degenerative disc disease.   2. LOCATION: Where does it hurt? (upper, mid or lower back)     Lower back pain.    No injuries it's just flaring up. I have Lupus and interstitial lung disease     I want to be seen for URI symptoms.  Stuffy nose, coughing, fever.    3. SEVERITY: How bad is the pain?  (e.g., Scale 1-10; mild, moderate, or severe)     It's really hurting. 4. PATTERN: Is the pain constant? (e.g., yes, no; constant, intermittent)      If sitting not help but moving makes it hurt. 5. RADIATION: Does the pain shoot into your legs or somewhere else?     No pain in legs 6. CAUSE:  What do you think is causing the back pain?      I have degenerative disc diease.   It's the left lower side. 7. BACK OVERUSE:  Any recent lifting of heavy objects, strenuous work or exercise?     No 8. MEDICINES: What have you taken so far for the pain? (e.g., nothing, acetaminophen , NSAIDS)     Not asked 9. NEUROLOGIC SYMPTOMS: Do you have any weakness, numbness, or problems with bowel/bladder control?     This is chronic  10. OTHER SYMPTOMS: Do you have any other symptoms? (e.g., fever, abdomen pain, burning with urination, blood in urine)       Also having URI symptoms.   Wants to be seen  for that too.   See above 11. PREGNANCY: Is there any chance you are pregnant? When was your last menstrual period?       N/A  Protocols used: Back Pain-A-AH FYI Only or Action Required?: FYI only for provider.  Patient was last seen in primary care on 01/05/2024 by Kennyth Worth HERO, MD.  Called Nurse Triage reporting Back Pain.  Symptoms began several days ago. Also having URI symptoms, coughing, stuffy nose.  Has interstitial lung disease and Lupus so wants to be seen to prevent pneumonia.    Interventions attempted: Prescription medications: prescription medications he takes for the back pain.   Has degenerative disc disease that is flaring up.  Symptoms are: rapidly worsening. Lower back pain is flaring up and having worsening URI symptoms.  Triage Disposition: See PCP When Office is Open (Within 3 Days)Appt made for today with Dr. Kennyth.  Patient/caregiver understands and will follow disposition?: Yes

## 2024-08-29 NOTE — Patient Instructions (Signed)
 It was very nice to see you today!  VISIT SUMMARY: During your visit, we discussed your recent COVID-19 infection and its impact on your existing health conditions, including interstitial lung disease and chronic back pain. We also reviewed your chronic diarrhea management.  YOUR PLAN: COVID-19 INFECTION: You have a confirmed COVID-19 infection with symptoms of cough, fever, and sweating. -Take prednisone  50 mg daily for 5 days to help manage your symptoms.  AUTOIMMUNE DISEASE WITH INTERSTITIAL LUNG DISEASE: Your autoimmune disease involves your lungs, and there is concern for a flare-up due to COVID-19. -Take prednisone  50 mg daily for 5 days to manage potential exacerbation.  DEGENERATIVE DISC DISEASE WITH SACROILIAC JOINT INFLAMMATION: You have chronic back pain localized to the sacroiliac joint due to inflammation. -Take prednisone  50 mg daily for 5 days to reduce inflammation and alleviate symptoms.  CHRONIC DIARRHEA SECONDARY TO CHOLECYSTECTOMY AND MEDICATION: You experience chronic diarrhea 2-3 times per month due to your past gallbladder removal and medication interactions. -Use Imodium  as needed, limited to one tablet to avoid constipation.  Return if symptoms worsen or fail to improve.   Take care, Dr Kennyth  PLEASE NOTE:  If you had any lab tests, please let us  know if you have not heard back within a few days. You may see your results on mychart before we have a chance to review them but we will give you a call once they are reviewed by us .   If we ordered any referrals today, please let us  know if you have not heard from their office within the next week.   If you had any urgent prescriptions sent in today, please check with the pharmacy within an hour of our visit to make sure the prescription was transmitted appropriately.   Please try these tips to maintain a healthy lifestyle:  Eat at least 3 REAL meals and 1-2 snacks per day.  Aim for no more than 5 hours between  eating.  If you eat breakfast, please do so within one hour of getting up.   Each meal should contain half fruits/vegetables, one quarter protein, and one quarter carbs (no bigger than a computer mouse)  Cut down on sweet beverages. This includes juice, soda, and sweet tea.   Drink at least 1 glass of water with each meal and aim for at least 8 glasses per day  Exercise at least 150 minutes every week.

## 2024-08-29 NOTE — Assessment & Plan Note (Signed)
 Patient is seeing gastroenterology for this.  Follow-up that his chronic diarrhea is likely secondary to cholecystectomy.  He cannot take cholestyramine due to being anticoagulated on warfarin.  He does have Imodium  to use as needed.

## 2024-08-29 NOTE — Assessment & Plan Note (Signed)
 Follows with pulmonology for this and is currently on CellCept  1000 micrograms twice daily and albuterol  as needed.  Mild flare today related to above COVID infection.  Will start prednisone  burst.  Has tolerated this well previously.

## 2024-08-29 NOTE — Telephone Encounter (Signed)
 Appt today

## 2024-09-08 ENCOUNTER — Other Ambulatory Visit: Payer: Self-pay | Admitting: Physician Assistant

## 2024-09-13 ENCOUNTER — Ambulatory Visit (INDEPENDENT_AMBULATORY_CARE_PROVIDER_SITE_OTHER)

## 2024-09-13 DIAGNOSIS — Z7901 Long term (current) use of anticoagulants: Secondary | ICD-10-CM

## 2024-09-13 LAB — POCT INR: INR: 4.2 — AB (ref 2.0–3.0)

## 2024-09-13 NOTE — Patient Instructions (Addendum)
 Pre visit review using our clinic review tool, if applicable. No additional management support is needed unless otherwise documented below in the visit note.  Hold warfarin today and then continue 2 tablets daily except take 3 tablets on Mondays and Thursdays. Recheck in 2 weeks.

## 2024-09-13 NOTE — Progress Notes (Signed)
 I have reviewed the patient's encounter and agree with the documentation.  Worth HERO. Kennyth, MD 09/13/2024 9:48 AM

## 2024-09-13 NOTE — Progress Notes (Signed)
 Indication: APS, DVT Pt was placed on prednisone  x 5 days on 9/30, for sore throat/Covid. Pt reports he took aspirin  yesterday due to inability to get tylenol .  Pt has been very consistent on the current dosing. Pt denies any s/s of abnormal bruising or bleeding. Due to consistent therapeutic INRs on current dose and other factors, such as illness that could be influencing INR today, will not make any change to weekly dose. Will recheck in 2 weeks. Advised if any s/s of abnormal bruising or bleeding to go to ER. Pt verbalized understanding.  Hold warfarin today and then continue 2 tablets daily except take 3 tablets on Mondays and Thursdays. Recheck in 2 weeks.

## 2024-09-20 ENCOUNTER — Ambulatory Visit: Admitting: Family Medicine

## 2024-09-20 VITALS — BP 120/86 | HR 93 | Temp 97.9°F | Wt 316.2 lb

## 2024-09-20 DIAGNOSIS — I2510 Atherosclerotic heart disease of native coronary artery without angina pectoris: Secondary | ICD-10-CM | POA: Diagnosis not present

## 2024-09-20 DIAGNOSIS — J849 Interstitial pulmonary disease, unspecified: Secondary | ICD-10-CM | POA: Diagnosis not present

## 2024-09-20 DIAGNOSIS — M329 Systemic lupus erythematosus, unspecified: Secondary | ICD-10-CM

## 2024-09-20 DIAGNOSIS — Z0279 Encounter for issue of other medical certificate: Secondary | ICD-10-CM

## 2024-09-20 DIAGNOSIS — I2583 Coronary atherosclerosis due to lipid rich plaque: Secondary | ICD-10-CM

## 2024-09-20 DIAGNOSIS — F32A Depression, unspecified: Secondary | ICD-10-CM

## 2024-09-20 MED ORDER — NITROGLYCERIN 0.4 MG SL SUBL: 0.4000 mg | SUBLINGUAL_TABLET | SUBLINGUAL | 3 refills | Status: AC | PRN

## 2024-09-20 MED ORDER — BUPROPION HCL 75 MG PO TABS: 75.0000 mg | ORAL_TABLET | Freq: Two times a day (BID) | ORAL | 1 refills | Status: AC

## 2024-09-20 NOTE — Progress Notes (Signed)
 Wesley Hancock is a 53 y.o. male who presents today for an office visit.  Assessment/Plan:  Chronic Problems Addressed Today: ILD (interstitial lung disease) ([name]) Following with neurology and is currently on CellCept  1000 mcg twice daily and albuterol  as needed.  He has a flareup recently related to COVID but this seems to be improving.  Reassuring lung exam today.  Did discuss that his cough may persist for a few more weeks.  He will let us  know if he has any worsening symptoms.  SLE (systemic lupus erythematosus) ([name]) Following with rheumatology.  On CellCept .  Will complete FMLA paperwork today for this and his interstitial lung disease.  Coronary artery disease Follows with cardiology.  On statin and Repatha .  Anticoagulated on warfarin.  Will refill his nitroglycerin  today.  Depression He has been under more stress recently due to his sister losing a newborn at 55 weeks old due to SIDS.  He is mostly concerned about the mental health with his wife and is encouraging her to follow-up with her PCP soon.  He is currently on Wellbutrin  75 mg twice daily and Lexapro  20 mg daily.     Subjective:  HPI:  See assessment / plan for status of chronic conditions.   Discussed the use of AI scribe software for clinical note transcription with the patient, who gave verbal consent to proceed.  History of Present Illness Wesley Hancock is a 53 year old male with interstitial lung disease who presents for paperwork completion and persistent cough post-COVID infection.  He is primarily here to have paperwork filled out, which was supposed to be faxed by [employer]. There were a few changes in the paperwork compared to last year, and he was advised to request seven days a month for his condition.  He recently had COVID-19, which has left him with a lingering cough. His oxygen saturation is at 92%, lower than his usual 95-98%. He is concerned about the persistent cough, given  his history of interstitial lung disease.  He is currently taking Wellbutrin  and nitroglycerin . He feels that his medications are effective and is on a low dose of Wellbutrin , which he is open to adjusting if needed. He has a history of depression and is proactive in managing his mental health, acknowledging the importance of medication adjustments when necessary.  He shares a recent family tragedy where his sister-in-law lost a four-week-old to SIDS, which has deeply affected his family. He is concerned for his wife's mental health, as she is very depressed following the loss. He plans to make an appointment for her to see Sam, as she is unlikely to do so on her own.  He has come to terms with his own depression and has been managing it well. He is aware of the importance of mental health and is vigilant about seeking help when needed. His work has slowed down, affecting his financial situation, but he remains generally satisfied with his family life.         Objective:  Physical Exam: BP 120/86   Pulse 93   Temp 97.9 F (36.6 C) (Temporal)   Wt (!) 316 lb 3.2 oz (143.4 kg)   SpO2 96%   BMI 40.60 kg/m   Gen: No acute distress, resting comfortably CV: Regular rate and rhythm with no murmurs appreciated Pulm: Normal work of breathing, clear to auscultation bilaterally with no crackles, wheezes, or rhonchi Neuro: Grossly normal, moves all extremities Psych: Normal affect and thought content  Worth HERO. Kennyth, MD 09/20/2024 8:36 AM

## 2024-09-20 NOTE — Assessment & Plan Note (Signed)
 Follows with cardiology.  On statin and Repatha .  Anticoagulated on warfarin.  Will refill his nitroglycerin  today.

## 2024-09-20 NOTE — Assessment & Plan Note (Signed)
 Following with neurology and is currently on CellCept  1000 mcg twice daily and albuterol  as needed.  He has a flareup recently related to COVID but this seems to be improving.  Reassuring lung exam today.  Did discuss that his cough may persist for a few more weeks.  He will let us  know if he has any worsening symptoms.

## 2024-09-20 NOTE — Assessment & Plan Note (Signed)
 He has been under more stress recently due to his sister losing a newborn at 13 weeks old due to SIDS.  He is mostly concerned about the mental health with his wife and is encouraging her to follow-up with her PCP soon.  He is currently on Wellbutrin  75 mg twice daily and Lexapro  20 mg daily.

## 2024-09-20 NOTE — Patient Instructions (Signed)
 It was very nice to see you today!  VISIT SUMMARY: During your visit, we discussed your persistent cough following a recent COVID-19 infection, your interstitial lung disease, and your ongoing management of depression. We also completed the necessary paperwork for [employer].  YOUR PLAN: POST-COVID-19 COUGH: You have a lingering cough after recovering from COVID-19. -Monitor your symptoms for gradual improvement.  INTERSTITIAL LUNG DISEASE: Your oxygen saturation is currently at 92%, which is lower than your usual range of 95-98%. -Continue to monitor your oxygen levels and report any significant changes.  DEPRESSION: You are managing your depression well with Wellbutrin  and are aware of the importance of mental health. -Refill your Wellbutrin  prescription. -Communicate any changes in your mental health needs. -Monitor for worsening depression, especially due to recent family stress. -Check with Sam about scheduling a mental health support appointment for your wife.  Return if symptoms worsen or fail to improve.   Take care, Dr Kennyth  PLEASE NOTE:  If you had any lab tests, please let us  know if you have not heard back within a few days. You may see your results on mychart before we have a chance to review them but we will give you a call once they are reviewed by us .   If we ordered any referrals today, please let us  know if you have not heard from their office within the next week.   If you had any urgent prescriptions sent in today, please check with the pharmacy within an hour of our visit to make sure the prescription was transmitted appropriately.   Please try these tips to maintain a healthy lifestyle:  Eat at least 3 REAL meals and 1-2 snacks per day.  Aim for no more than 5 hours between eating.  If you eat breakfast, please do so within one hour of getting up.   Each meal should contain half fruits/vegetables, one quarter protein, and one quarter carbs (no bigger  than a computer mouse)  Cut down on sweet beverages. This includes juice, soda, and sweet tea.   Drink at least 1 glass of water with each meal and aim for at least 8 glasses per day  Exercise at least 150 minutes every week.

## 2024-09-20 NOTE — Assessment & Plan Note (Signed)
 Following with rheumatology.  On CellCept .  Will complete FMLA paperwork today for this and his interstitial lung disease.

## 2024-09-25 ENCOUNTER — Telehealth: Payer: Self-pay | Admitting: Family Medicine

## 2024-09-25 NOTE — Telephone Encounter (Signed)
 [employer] faxed FMLA, to be filled out by provider. Patient requested to send it back via Fax within ASAP. Document is located in providers tray at front office.Please advise at [phone].

## 2024-09-25 NOTE — Telephone Encounter (Signed)
 FMLA faxed to [phone]  Copy Mail to patient  Form placed to be scan in Patient chart

## 2024-09-26 ENCOUNTER — Encounter: Payer: Self-pay | Admitting: Family Medicine

## 2024-09-27 ENCOUNTER — Other Ambulatory Visit: Payer: Self-pay | Admitting: Physician Assistant

## 2024-09-27 ENCOUNTER — Telehealth: Payer: Self-pay

## 2024-09-27 ENCOUNTER — Ambulatory Visit

## 2024-09-27 NOTE — Telephone Encounter (Signed)
 Pt NS his coumadin  clinic apt this morning. Tried to contact pt by phone but no answer and VM is full.  Sent a radio broadcast assistant.

## 2024-09-27 NOTE — Telephone Encounter (Signed)
 Pt contacted coumadin  clinic and RS apt for next week.

## 2024-09-28 ENCOUNTER — Other Ambulatory Visit: Payer: Self-pay | Admitting: Medical Genetics

## 2024-09-28 DIAGNOSIS — Z006 Encounter for examination for normal comparison and control in clinical research program: Secondary | ICD-10-CM

## 2024-09-30 ENCOUNTER — Encounter: Payer: Self-pay | Admitting: Family Medicine

## 2024-09-30 MED ORDER — AMOXICILLIN-POT CLAVULANATE 875-125 MG PO TABS: 1.0000 | ORAL_TABLET | Freq: Two times a day (BID) | ORAL | 0 refills | Status: AC

## 2024-10-04 ENCOUNTER — Ambulatory Visit

## 2024-10-04 DIAGNOSIS — Z7901 Long term (current) use of anticoagulants: Secondary | ICD-10-CM | POA: Diagnosis not present

## 2024-10-04 LAB — POCT INR: INR: 2 (ref 2.0–3.0)

## 2024-10-04 NOTE — Progress Notes (Signed)
 Indication: APS, DVT Pt reports his sister-in-law had a baby pass from SIDS and this has been very stressful for the entire family. He also has a diverticulitis flare and was placed on Augmentin  on 11/1 x 10 days. He also reports he has been eating a mostly liquid diet due to diverticulitis. Increase dose today to take 3 tablets and increase dose tomorrow to take 4 1/2 tablets and then continue 2 tablets daily except take 3 tablets on Mondays and Thursdays. Recheck in 2 weeks.

## 2024-10-04 NOTE — Progress Notes (Signed)
 I have reviewed the patient's encounter and agree with the documentation.  Worth HERO. Kennyth, MD 10/04/2024 9:03 AM

## 2024-10-04 NOTE — Patient Instructions (Addendum)
 Pre visit review using our clinic review tool, if applicable. No additional management support is needed unless otherwise documented below in the visit note.  Increase dose today to take 3 tablets and increase dose tomorrow to take 4 1/2 tablets and then continue 2 tablets daily except take 3 tablets on Mondays and Thursdays. Recheck in 2 weeks.

## 2024-10-18 ENCOUNTER — Ambulatory Visit

## 2024-10-18 DIAGNOSIS — Z7901 Long term (current) use of anticoagulants: Secondary | ICD-10-CM | POA: Diagnosis not present

## 2024-10-18 LAB — POCT INR: INR: 3.5 — AB (ref 2.0–3.0)

## 2024-10-18 NOTE — Patient Instructions (Addendum)
 Pre visit review using our clinic review tool, if applicable. No additional management support is needed unless otherwise documented below in the visit note.  Continue 2 tablets daily except take 3 tablets on Mondays and Thursdays. Recheck in 4 weeks.

## 2024-10-18 NOTE — Progress Notes (Signed)
 Indication: APS, DVT Continue 2 tablets daily except take 3 tablets on Mondays and Thursdays. Recheck in 4 weeks.

## 2024-10-18 NOTE — Progress Notes (Signed)
 I have reviewed the patient's encounter and agree with the documentation.  Worth HERO. Kennyth, MD 10/18/2024 9:08 AM

## 2024-11-15 ENCOUNTER — Ambulatory Visit

## 2024-11-15 DIAGNOSIS — Z7901 Long term (current) use of anticoagulants: Secondary | ICD-10-CM

## 2024-11-15 LAB — POCT INR: INR: 3.4 — AB (ref 2.0–3.0)

## 2024-11-15 MED ORDER — WARFARIN SODIUM 5 MG PO TABS: ORAL_TABLET | ORAL | 1 refills | Status: AC

## 2024-11-15 NOTE — Progress Notes (Signed)
 I have reviewed the patient's encounter and agree with the documentation.  Worth HERO. Kennyth, MD 11/15/2024 9:07 AM

## 2024-11-15 NOTE — Patient Instructions (Addendum)
 Pre visit review using our clinic review tool, if applicable. No additional management support is needed unless otherwise documented below in the visit note.  Continue 2 tablets daily except take 3 tablets on Mondays and Thursdays. Recheck in 4 weeks.

## 2024-11-15 NOTE — Progress Notes (Signed)
 Indication: APS, DVT Continue 2 tablets daily except take 3 tablets on Mondays and Thursdays. Recheck in 4 weeks.

## 2024-12-12 ENCOUNTER — Encounter: Payer: Self-pay | Admitting: Pulmonary Disease

## 2024-12-12 ENCOUNTER — Ambulatory Visit: Admitting: Pulmonary Disease

## 2024-12-13 ENCOUNTER — Ambulatory Visit

## 2024-12-13 DIAGNOSIS — Z7901 Long term (current) use of anticoagulants: Secondary | ICD-10-CM

## 2024-12-13 LAB — POCT INR: INR: 2.5 (ref 2.0–3.0)

## 2024-12-13 NOTE — Patient Instructions (Addendum)
 Pre visit review using our clinic review tool, if applicable. No additional management support is needed unless otherwise documented below in the visit note.  Continue 2 tablets daily except take 3 tablets on Mondays and Thursdays. Recheck in 4 weeks.

## 2024-12-13 NOTE — Progress Notes (Signed)
 Indication: APS, DVT Continue 2 tablets daily except take 3 tablets on Mondays and Thursdays. Recheck in 4 weeks.

## 2025-01-10 ENCOUNTER — Ambulatory Visit
